# Patient Record
Sex: Female | Born: 1950 | Race: Black or African American | Hispanic: No | Marital: Single | Smoking: Current every day smoker
Health system: Southern US, Community
[De-identification: ages and names within clinical notes are randomized; demographics above are authoritative.]

## PROBLEM LIST (undated history)

## (undated) DIAGNOSIS — Z720 Tobacco use: Secondary | ICD-10-CM

## (undated) DIAGNOSIS — I10 Essential (primary) hypertension: Secondary | ICD-10-CM

## (undated) DIAGNOSIS — D631 Anemia in chronic kidney disease: Secondary | ICD-10-CM

## (undated) DIAGNOSIS — K219 Gastro-esophageal reflux disease without esophagitis: Secondary | ICD-10-CM

## (undated) DIAGNOSIS — R9389 Abnormal findings on diagnostic imaging of other specified body structures: Secondary | ICD-10-CM

## (undated) DIAGNOSIS — I2510 Atherosclerotic heart disease of native coronary artery without angina pectoris: Secondary | ICD-10-CM

## (undated) DIAGNOSIS — F419 Anxiety disorder, unspecified: Secondary | ICD-10-CM

## (undated) DIAGNOSIS — N189 Chronic kidney disease, unspecified: Secondary | ICD-10-CM

## (undated) DIAGNOSIS — M8580 Other specified disorders of bone density and structure, unspecified site: Secondary | ICD-10-CM

## (undated) DIAGNOSIS — N183 Chronic kidney disease, stage 3 unspecified: Secondary | ICD-10-CM

## (undated) DIAGNOSIS — F410 Panic disorder [episodic paroxysmal anxiety] without agoraphobia: Secondary | ICD-10-CM

## (undated) DIAGNOSIS — H35039 Hypertensive retinopathy, unspecified eye: Secondary | ICD-10-CM

## (undated) DIAGNOSIS — E559 Vitamin D deficiency, unspecified: Secondary | ICD-10-CM

## (undated) DIAGNOSIS — I509 Heart failure, unspecified: Secondary | ICD-10-CM

## (undated) DIAGNOSIS — J45909 Unspecified asthma, uncomplicated: Secondary | ICD-10-CM

## (undated) DIAGNOSIS — G5600 Carpal tunnel syndrome, unspecified upper limb: Secondary | ICD-10-CM

## (undated) DIAGNOSIS — J189 Pneumonia, unspecified organism: Secondary | ICD-10-CM

## (undated) DIAGNOSIS — R51 Headache: Secondary | ICD-10-CM

## (undated) DIAGNOSIS — K2100 Gastro-esophageal reflux disease with esophagitis, without bleeding: Secondary | ICD-10-CM

## (undated) DIAGNOSIS — M1990 Unspecified osteoarthritis, unspecified site: Secondary | ICD-10-CM

## (undated) DIAGNOSIS — K5090 Crohn's disease, unspecified, without complications: Secondary | ICD-10-CM

## (undated) DIAGNOSIS — R569 Unspecified convulsions: Secondary | ICD-10-CM

## (undated) DIAGNOSIS — E785 Hyperlipidemia, unspecified: Secondary | ICD-10-CM

## (undated) DIAGNOSIS — Z8679 Personal history of other diseases of the circulatory system: Secondary | ICD-10-CM

## (undated) HISTORY — DX: Atherosclerotic heart disease of native coronary artery without angina pectoris: I25.10

## (undated) HISTORY — DX: Hypertensive retinopathy, unspecified eye: H35.039

## (undated) HISTORY — PX: ECTOPIC PREGNANCY SURGERY: SHX613

## (undated) HISTORY — DX: Heart failure, unspecified: I50.9

## (undated) HISTORY — DX: Essential (primary) hypertension: I10

## (undated) HISTORY — DX: Carpal tunnel syndrome, unspecified upper limb: G56.00

## (undated) HISTORY — DX: Gastro-esophageal reflux disease with esophagitis, without bleeding: K21.00

## (undated) HISTORY — DX: Unspecified convulsions: R56.9

## (undated) HISTORY — PX: ILEOSTOMY: SHX1783

## (undated) HISTORY — DX: Other specified disorders of bone density and structure, unspecified site: M85.80

## (undated) HISTORY — DX: Vitamin D deficiency, unspecified: E55.9

## (undated) HISTORY — DX: Hyperlipidemia, unspecified: E78.5

## (undated) HISTORY — PX: EYE SURGERY: SHX253

## (undated) HISTORY — PX: CATARACT EXTRACTION: SUR2

## (undated) HISTORY — PX: CATARACT EXTRACTION W/ INTRAOCULAR LENS  IMPLANT, BILATERAL: SHX1307

## (undated) NOTE — *Deleted (*Deleted)
[facility] Note  05/28/2020     CHIEF COMPLAINT Patient presents for No chief complaint on file.   HISTORY OF PRESENT ILLNESS: [name] [name] is a 6 y.o. female who presents to the clinic today for:   pt states her left eye vision is clearing up, but is still slightly blurry, she denies fol  Referring physician: Shirleen Schirmer, PA-C     HISTORICAL INFORMATION:   Selected notes from the MEDICAL RECORD NUMBER Referred by Shirleen Schirmer for concern of PVD / vitreous hemorrhage   CURRENT MEDICATIONS: No current outpatient medications on file. (Ophthalmic Drugs)   No current facility-administered medications for this visit. (Ophthalmic Drugs)   Current Outpatient Medications (Other)  Medication Sig  . ascorbic acid (VITAMIN C) 500 MG tablet Take by mouth.  Marland Kitchen atorvastatin (LIPITOR) 40 MG tablet Take 1 tablet (40 mg total) by mouth daily. Stop taking simvastatin.  Marland Kitchen BIDIL 20-37.5 MG tablet Take 1 tablet by mouth 3 (three) times daily.  . calcium citrate (CALCITRATE - DOSED IN MG ELEMENTAL CALCIUM) 950 (200 Ca) MG tablet Take by mouth daily.  . carvedilol (COREG) 6.25 MG tablet Take 1 tablet (6.25 mg total) by mouth 2 (two) times daily with a meal.  . cholecalciferol (VITAMIN D) 1000 units tablet Take 1,000 Units by mouth daily.  . clopidogrel (PLAVIX) 75 MG tablet Take 1 tablet (75 mg total) by mouth daily.  . Ferrous Fumarate 86 (27 Fe) MG CAPS Take by mouth daily.  . hydrALAZINE (APRESOLINE) 25 MG tablet Take 1 tablet (25 mg total) by mouth 3 (three) times daily.  . isosorbide mononitrate (IMDUR) 30 MG 24 hr tablet Take 0.5 tablets (15 mg total) by mouth daily.  Marland Kitchen lidocaine (XYLOCAINE) 2 % solution Use as directed 20 mLs in the mouth or throat as needed for mouth pain.  Marland Kitchen LORazepam (ATIVAN) 0.5 MG tablet TAKE ONE TABLET BY MOUTH TWICE DAILY AS NEEDED FOR ANXIETY  . Mesalamine 800 MG TBEC TAKE ONE TABLET BY MOUTH THREE TIMES DAILY  . mirtazapine  (REMERON) 15 MG tablet Take 1 tablet (15 mg total) by mouth at bedtime.  . Multiple Vitamin (MULTIVITAMIN) tablet Take 1 tablet by mouth 2 (two) times daily.  . pregabalin (LYRICA) 25 MG capsule Take 1 capsule (25 mg total) by mouth 2 (two) times daily.  . sodium bicarbonate 650 MG tablet Take 1,300 mg by mouth 2 (two) times daily.  Marland Kitchen triamcinolone ointment (KENALOG) 0.1 % Apply 1 application topically 2 (two) times daily. Apply to skin around stoma   No current facility-administered medications for this visit. (Other)      REVIEW OF SYSTEMS:    ALLERGIES Allergies  Allergen Reactions  . Amoxicillin Anaphylaxis, Rash and Other (See Comments)    Throat Swelling  . Aspirin Anaphylaxis, Itching and Rash  . Morphine And Related Anaphylaxis  . Penicillins Anaphylaxis and Rash  . Gabapentin Other (See Comments)    Patient had one time seizure shortly after stopping gabapentin  . Ciprofloxacin Swelling    Possible reaction to cipro or clindamycin 04/29/14  . Ace Inhibitors Other (See Comments) and Cough    ACE possibly associated with cough and switched to ARB.  Would be OK to re-challenge if needed.    PAST MEDICAL HISTORY Past Medical History:  Diagnosis Date  . Abnormal chest CT    Coronary atherosclerosis on chest CT 2012  . Anemia, chronic renal failure   . Anxiety   . Asthma 05/2011  .  CAD (coronary artery disease)   . Carpal tunnel syndrome   . CHF (congestive heart failure) (HCC)    EF 30-35% 2012->EF 60-65% 2013  . CKD (chronic kidney disease), stage III ([place])   . Crohn's disease ([place])   . GERD (gastroesophageal reflux disease)   . Headache(784.0)    "related to high BP" (05/30/2012)  . 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 Hyperlipidemia   . Hypertension   . Hypertensive retinopathy    OU  . Osteopenia   . Panic attacks   . Reflux esophagitis   . Seizure ([place])    hx of  . Tobacco abuse   . Vitamin D deficiency    Past Surgical History:  Procedure  Laterality Date  . CATARACT EXTRACTION Bilateral    Dr. Clent Jacks  . CATARACT EXTRACTION W/ INTRAOCULAR LENS  IMPLANT, BILATERAL  ~ 2000  . CHOLECYSTECTOMY  01/28/2005  . COLOSTOMY  03/1996   diverting  . ECTOPIC PREGNANCY SURGERY  ?1980's   left  . EYE SURGERY Bilateral    Cat Sx - Dr. Clent Jacks  . ILEOSTOMY  ?  2002  . NECK SURGERY  2020   "pinched nerve"    FAMILY HISTORY Family History  Problem Relation Age of Onset  . Heart disease Mother   . Other Mother        Covid  . Glaucoma Mother   . Stroke Father   . Other Sister        AIDS    SOCIAL HISTORY Social History   Tobacco Use  . Smoking status: Current Every Day Smoker    Packs/day: 0.12    Years: 30.00    Pack years: 3.60    Types: Cigarettes  . Smokeless tobacco: Never Used  Substance Use Topics  . Alcohol use: No    Comment: 05/30/2012 "used to drink back in the day; last alcohol 23 yr ago"  . Drug use: No         OPHTHALMIC EXAM:  Not recorded     IMAGING AND PROCEDURES  Imaging and Procedures for 05/28/2020           ASSESSMENT/PLAN:    ICD-10-CM   1. Posterior vitreous detachment of left eye  H43.812   2. Retinal edema  H35.81   3. Proliferative retinopathy of right eye  H35.21   4. Retinal ischemia  H35.82   5. Essential hypertension  I10   6. Hypertensive retinopathy of both eyes  H35.033   7. Pseudophakia, both eyes  Z96.1     1,2. Hemorrhagic PVD OS  - pt [facility]  - Discussed findings and prognosis  - No RT or RD on 360 scleral depressed exam OS  - Reviewed s/s of RT/RD  - Strict return precautions for any such RT/RD signs/symptoms  - pt is on plavix  - VH improving  - VH precautions reviewed -- minimize activities, keep head elevated, avoid ASA/NSAIDs/blood thinners as able  - F/U 1 week, sooner prn -- DFE/OCT  3,4. Proliferative retinopathy, non diabetic OD; retinal ischemia OU  - FA 10.25.21 shows   OD - focal area of peripheral  nonperfusion with leaking NV, sup temp periphery   OS - focal area of peripheral nonperfusion with leaking MA, inf temp periphery  - recommend PRP OD today, 11.23.21  - pt wishes to proceed with laser  - RBA of procedure discussed, questions answered  - informed consent obtained and signed  - see  procedure note  - start PF QID OD x7 days  - f/u  5,6. Hypertensive retinopathy OU - discussed importance of tight BP control - monitor  7. Pseudophakia OU  - s/p CE/IOL OU (Dr. Elliot Dally)  - IOLs in good position, doing well  - monitor   Ophthalmic Meds Ordered this visit:  No orders of the defined types were placed in this encounter.      No follow-ups on file.  There are no Patient Instructions on file for this visit.  This document serves as a record of services personally performed by Gardiner Sleeper, MD, PhD. It was created on their behalf by San Jetty. Owens Shark, OA an ophthalmic technician. The creation of this record is the provider's dictation and/or activities during the visit.    Electronically signed by: San Jetty. Marguerita Merles 11.22.2021 12:40 PM  Gardiner Sleeper, [name], Ph.D. Diseases & Surgery of the Retina and Vitreous [facility]: M myopia (nearsighted); A astigmatism; H hyperopia (farsighted); P presbyopia; Mrx spectacle prescription;  CTL contact lenses; OD right eye; OS left eye; OU both eyes  XT exotropia; ET esotropia; PEK punctate epithelial keratitis; PEE punctate epithelial erosions; DES dry eye syndrome; MGD meibomian gland dysfunction; ATs artificial tears; PFAT's preservative free artificial tears; [employer] nuclear sclerotic cataract; PSC posterior subcapsular cataract; ERM epi-retinal membrane; PVD posterior vitreous detachment; RD retinal detachment; DM diabetes mellitus; DR diabetic retinopathy; NPDR non-proliferative diabetic retinopathy; PDR proliferative diabetic retinopathy; CSME clinically significant macular edema; DME  diabetic macular edema; dbh dot blot hemorrhages; CWS cotton wool spot; POAG primary open angle glaucoma; C/D cup-to-disc ratio; HVF humphrey visual field; GVF goldmann visual field; OCT optical coherence tomography; IOP intraocular pressure; BRVO Branch retinal vein occlusion; CRVO central retinal vein occlusion; CRAO central retinal artery occlusion; BRAO branch retinal artery occlusion; RT retinal tear; SB scleral buckle; PPV pars plana vitrectomy; VH Vitreous hemorrhage; PRP panretinal laser photocoagulation; IVK intravitreal kenalog; VMT vitreomacular traction; MH Macular hole;  NVD neovascularization of the disc; NVE neovascularization elsewhere; AREDS age related eye disease study; ARMD age related macular degeneration; POAG primary open angle glaucoma; EBMD epithelial/anterior basement membrane dystrophy; ACIOL anterior chamber intraocular lens; IOL intraocular lens; PCIOL posterior chamber intraocular lens; Phaco/IOL phacoemulsification with intraocular lens placement; [place] photorefractive keratectomy; LASIK laser assisted in situ keratomileusis; HTN hypertension; DM diabetes mellitus; COPD chronic obstructive pulmonary disease

## (undated) NOTE — *Deleted (*Deleted)
[facility] Note  05/21/2020     CHIEF COMPLAINT Patient presents for No chief complaint on file.   HISTORY OF PRESENT ILLNESS: [name] [name] is a 73 y.o. female who presents to the clinic today for:    Referring physician: Shirleen Schirmer, PA-C    HISTORICAL INFORMATION:   Selected notes from the MEDICAL RECORD NUMBER Referred by Shirleen Schirmer for concern of PVD / vitreous hemorrhage   CURRENT MEDICATIONS: No current outpatient medications on file. (Ophthalmic Drugs)   No current facility-administered medications for this visit. (Ophthalmic Drugs)   Current Outpatient Medications (Other)  Medication Sig  . ascorbic acid (VITAMIN C) 500 MG tablet Take by mouth.  Marland Kitchen atorvastatin (LIPITOR) 40 MG tablet Take 1 tablet (40 mg total) by mouth daily. Stop taking simvastatin.  Marland Kitchen BIDIL 20-37.5 MG tablet Take 1 tablet by mouth 3 (three) times daily.  . calcium citrate (CALCITRATE - DOSED IN MG ELEMENTAL CALCIUM) 950 (200 Ca) MG tablet Take by mouth daily.  . carvedilol (COREG) 6.25 MG tablet Take 1 tablet (6.25 mg total) by mouth 2 (two) times daily with a meal.  . cholecalciferol (VITAMIN D) 1000 units tablet Take 1,000 Units by mouth daily.  . clopidogrel (PLAVIX) 75 MG tablet Take 1 tablet (75 mg total) by mouth daily.  . Ferrous Fumarate 86 (27 Fe) MG CAPS Take by mouth daily.  . hydrALAZINE (APRESOLINE) 25 MG tablet Take 1 tablet (25 mg total) by mouth 3 (three) times daily.  . isosorbide mononitrate (IMDUR) 30 MG 24 hr tablet Take 0.5 tablets (15 mg total) by mouth daily.  Marland Kitchen lidocaine (XYLOCAINE) 2 % solution Use as directed 20 mLs in the mouth or throat as needed for mouth pain.  Marland Kitchen LORazepam (ATIVAN) 0.5 MG tablet TAKE ONE TABLET BY MOUTH TWICE DAILY AS NEEDED FOR ANXIETY  . Mesalamine 800 MG TBEC TAKE ONE TABLET BY MOUTH THREE TIMES DAILY  . mirtazapine (REMERON) 15 MG tablet Take 1 tablet (15 mg total) by mouth at bedtime.  . Multiple Vitamin  (MULTIVITAMIN) tablet Take 1 tablet by mouth 2 (two) times daily.  . pregabalin (LYRICA) 25 MG capsule Take 1 capsule (25 mg total) by mouth 2 (two) times daily.  . sodium bicarbonate 650 MG tablet Take 1,300 mg by mouth 2 (two) times daily.  Marland Kitchen triamcinolone ointment (KENALOG) 0.1 % Apply 1 application topically 2 (two) times daily. Apply to skin around stoma   No current facility-administered medications for this visit. (Other)      REVIEW OF SYSTEMS:    ALLERGIES Allergies  Allergen Reactions  . Amoxicillin Anaphylaxis, Rash and Other (See Comments)    Throat Swelling  . Aspirin Anaphylaxis, Itching and Rash  . Morphine And Related Anaphylaxis  . Penicillins Anaphylaxis and Rash  . Gabapentin Other (See Comments)    Patient had one time seizure shortly after stopping gabapentin  . Ciprofloxacin Swelling    Possible reaction to cipro or clindamycin 04/29/14  . Ace Inhibitors Other (See Comments) and Cough    ACE possibly associated with cough and switched to ARB.  Would be OK to re-challenge if needed.    PAST MEDICAL HISTORY Past Medical History:  Diagnosis Date  . Abnormal chest CT    Coronary atherosclerosis on chest CT 2012  . Anemia, chronic renal failure   . Anxiety   . Asthma 05/2011  . CAD (coronary artery disease)   . Carpal tunnel syndrome   . CHF (congestive heart  failure) ([place])    EF 30-35% 2012->EF 60-65% 2013  . CKD (chronic kidney disease), stage III ([place])   . Crohn's disease ([place])   . GERD (gastroesophageal reflux disease)   . Headache(784.0)    "related to high BP" (05/30/2012)  . 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 Hyperlipidemia   . Hypertension   . Hypertensive retinopathy    OU  . Osteopenia   . Panic attacks   . Reflux esophagitis   . Seizure ([place])    hx of  . Tobacco abuse   . Vitamin D deficiency    Past Surgical History:  Procedure Laterality Date  . CATARACT EXTRACTION Bilateral    Dr. Clent Jacks  . CATARACT EXTRACTION W/  INTRAOCULAR LENS  IMPLANT, BILATERAL  ~ 2000  . CHOLECYSTECTOMY  01/28/2005  . COLOSTOMY  03/1996   diverting  . ECTOPIC PREGNANCY SURGERY  ?1980's   left  . EYE SURGERY Bilateral    Cat Sx - Dr. Clent Jacks  . ILEOSTOMY  ?  2002  . NECK SURGERY  2020   "pinched nerve"    FAMILY HISTORY Family History  Problem Relation Age of Onset  . Heart disease Mother   . Other Mother        Covid  . Glaucoma Mother   . Stroke Father   . Other Sister        AIDS    SOCIAL HISTORY Social History   Tobacco Use  . Smoking status: Current Every Day Smoker    Packs/day: 0.12    Years: 30.00    Pack years: 3.60    Types: Cigarettes  . Smokeless tobacco: Never Used  Substance Use Topics  . Alcohol use: No    Comment: 05/30/2012 "used to drink back in the day; last alcohol 23 yr ago"  . Drug use: No         OPHTHALMIC EXAM:  Not recorded     IMAGING AND PROCEDURES  Imaging and Procedures for 05/21/2020           ASSESSMENT/PLAN:  No diagnosis found.  1,2. Hemorrhagic PVD OS  - pt presented [facility] 10[facility]8.2021  - Discussed findings and prognosis  - No RT or RD on 360 scleral depressed exam OS  - Reviewed s/s of RT/RD  - Strict return precautions for any such RT/RD signs/symptoms  - pt is on plavix  - VH improving  - VH precautions reviewed -- minimize activities, keep head elevated, avoid ASA/NSAIDs/blood thinners as able  - F/U 2-3 weeks, sooner prn -- DFE/OCT  3,4. Proliferative retinopathy, non diabetic OD; retinal ischemia OU  - FA 10.25.21 shows   OD - focal area of peripheral nonperfusion with leaking NV, sup temp periphery   OS - focal area of peripheral nonperfusion with leaking MA, inf temp periphery  - may benefit from focal, segmental PRP OD  5,6. Hypertensive retinopathy OU - discussed importance of tight BP control - monitor  7. Pseudophakia OU  - s/p CE/IOL OU (Dr. Elliot Dally)  - IOLs in good position, doing well  - monitor  Ophthalmic  Meds Ordered this visit:  No orders of the defined types were placed in this encounter.      No follow-ups on file.  There are no Patient Instructions on file for this visit.  This document serves as a record of services personally performed by Gardiner Sleeper, MD, PhD. It was created on their behalf by Estill Bakes, COT an  ophthalmic technician. The creation of this record is the provider's dictation and/or activities during the visit.    Electronically signed by: Estill Bakes, COT 11.11.21 @ 9:57 AM  Abbreviations: M myopia (nearsighted); A astigmatism; H hyperopia (farsighted); P presbyopia; Mrx spectacle prescription;  CTL contact lenses; OD right eye; OS left eye; OU both eyes  XT exotropia; ET esotropia; PEK punctate epithelial keratitis; PEE punctate epithelial erosions; DES dry eye syndrome; MGD meibomian gland dysfunction; ATs artificial tears; PFAT's preservative free artificial tears; [place] nuclear sclerotic cataract; PSC posterior subcapsular cataract; ERM epi-retinal membrane; PVD posterior vitreous detachment; RD retinal detachment; DM diabetes mellitus; DR diabetic retinopathy; NPDR non-proliferative diabetic retinopathy; PDR proliferative diabetic retinopathy; CSME clinically significant macular edema; DME diabetic macular edema; dbh dot blot hemorrhages; CWS cotton wool spot; POAG primary open angle glaucoma; C/D cup-to-disc ratio; HVF humphrey visual field; GVF goldmann visual field; OCT optical coherence tomography; IOP intraocular pressure; BRVO Branch retinal vein occlusion; CRVO central retinal vein occlusion; CRAO central retinal artery occlusion; BRAO branch retinal artery occlusion; RT retinal tear; SB scleral buckle; PPV pars plana vitrectomy; VH Vitreous hemorrhage; PRP panretinal laser photocoagulation; IVK intravitreal kenalog; VMT vitreomacular traction; MH Macular hole;  NVD neovascularization of the disc; NVE neovascularization elsewhere; AREDS age related eye disease  study; ARMD age related macular degeneration; POAG primary open angle glaucoma; EBMD epithelial/anterior basement membrane dystrophy; ACIOL anterior chamber intraocular lens; IOL intraocular lens; PCIOL posterior chamber intraocular lens; Phaco/IOL phacoemulsification with intraocular lens placement; [place] photorefractive keratectomy; LASIK laser assisted in situ keratomileusis; HTN hypertension; DM diabetes mellitus; COPD chronic obstructive pulmonary disease

---

## 1996-03-06 HISTORY — PX: COLOSTOMY: SHX63

## 1997-10-14 ENCOUNTER — Emergency Department (HOSPITAL_COMMUNITY): Admission: EM | Admit: 1997-10-14 | Discharge: 1997-10-14 | Payer: Self-pay | Admitting: Emergency Medicine

## 1997-11-02 ENCOUNTER — Encounter: Admission: RE | Admit: 1997-11-02 | Discharge: 1997-11-02 | Payer: Self-pay | Admitting: Family Medicine

## 1997-12-18 ENCOUNTER — Emergency Department (HOSPITAL_COMMUNITY): Admission: EM | Admit: 1997-12-18 | Discharge: 1997-12-18 | Payer: Self-pay | Admitting: Emergency Medicine

## 1997-12-31 ENCOUNTER — Encounter: Admission: RE | Admit: 1997-12-31 | Discharge: 1997-12-31 | Payer: Self-pay | Admitting: Family Medicine

## 1997-12-31 ENCOUNTER — Inpatient Hospital Stay (HOSPITAL_COMMUNITY): Admission: AD | Admit: 1997-12-31 | Discharge: 1998-01-03 | Payer: Self-pay

## 1998-01-14 ENCOUNTER — Encounter: Admission: RE | Admit: 1998-01-14 | Discharge: 1998-01-14 | Payer: Self-pay | Admitting: Family Medicine

## 1998-03-04 ENCOUNTER — Encounter: Admission: RE | Admit: 1998-03-04 | Discharge: 1998-03-04 | Payer: Self-pay | Admitting: Family Medicine

## 1998-04-10 ENCOUNTER — Inpatient Hospital Stay (HOSPITAL_COMMUNITY): Admission: EM | Admit: 1998-04-10 | Discharge: 1998-04-12 | Payer: Self-pay | Admitting: Emergency Medicine

## 1998-04-26 ENCOUNTER — Encounter: Admission: RE | Admit: 1998-04-26 | Discharge: 1998-04-26 | Payer: Self-pay | Admitting: Family Medicine

## 1998-05-08 ENCOUNTER — Inpatient Hospital Stay (HOSPITAL_COMMUNITY): Admission: EM | Admit: 1998-05-08 | Discharge: 1998-05-17 | Payer: Self-pay | Admitting: Emergency Medicine

## 1998-05-09 ENCOUNTER — Encounter: Admission: RE | Admit: 1998-05-09 | Discharge: 1998-05-09 | Payer: Self-pay | Admitting: Family Medicine

## 1998-05-14 ENCOUNTER — Encounter: Payer: Self-pay | Admitting: Emergency Medicine

## 1998-05-20 ENCOUNTER — Emergency Department (HOSPITAL_COMMUNITY): Admission: EM | Admit: 1998-05-20 | Discharge: 1998-05-20 | Payer: Self-pay | Admitting: Emergency Medicine

## 1998-05-20 ENCOUNTER — Encounter: Admission: RE | Admit: 1998-05-20 | Discharge: 1998-05-20 | Payer: Self-pay | Admitting: Family Medicine

## 1998-05-24 ENCOUNTER — Encounter (HOSPITAL_COMMUNITY): Admission: RE | Admit: 1998-05-24 | Discharge: 1998-08-22 | Payer: Self-pay | Admitting: Gastroenterology

## 1998-05-27 ENCOUNTER — Encounter: Admission: RE | Admit: 1998-05-27 | Discharge: 1998-05-27 | Payer: Self-pay | Admitting: Family Medicine

## 1998-06-21 ENCOUNTER — Encounter: Admission: RE | Admit: 1998-06-21 | Discharge: 1998-06-21 | Payer: Self-pay | Admitting: Family Medicine

## 1998-11-01 ENCOUNTER — Encounter (HOSPITAL_COMMUNITY): Admission: RE | Admit: 1998-11-01 | Discharge: 1999-01-30 | Payer: Self-pay | Admitting: Gastroenterology

## 1998-11-20 ENCOUNTER — Encounter: Admission: RE | Admit: 1998-11-20 | Discharge: 1998-11-20 | Payer: Self-pay | Admitting: Family Medicine

## 1998-12-16 ENCOUNTER — Encounter: Admission: RE | Admit: 1998-12-16 | Discharge: 1998-12-16 | Payer: Self-pay | Admitting: Family Medicine

## 1999-01-06 ENCOUNTER — Encounter: Admission: RE | Admit: 1999-01-06 | Discharge: 1999-01-06 | Payer: Self-pay | Admitting: Family Medicine

## 1999-01-27 ENCOUNTER — Encounter: Admission: RE | Admit: 1999-01-27 | Discharge: 1999-01-27 | Payer: Self-pay | Admitting: Sports Medicine

## 1999-03-03 ENCOUNTER — Encounter: Admission: RE | Admit: 1999-03-03 | Discharge: 1999-03-03 | Payer: Self-pay | Admitting: Family Medicine

## 1999-03-04 ENCOUNTER — Encounter: Admission: RE | Admit: 1999-03-04 | Discharge: 1999-03-04 | Payer: Self-pay | Admitting: Sports Medicine

## 1999-03-07 ENCOUNTER — Ambulatory Visit (HOSPITAL_COMMUNITY): Admission: RE | Admit: 1999-03-07 | Discharge: 1999-03-07 | Payer: Self-pay | Admitting: *Deleted

## 1999-03-07 ENCOUNTER — Encounter: Admission: RE | Admit: 1999-03-07 | Discharge: 1999-03-07 | Payer: Self-pay | Admitting: Family Medicine

## 1999-03-11 ENCOUNTER — Encounter: Admission: RE | Admit: 1999-03-11 | Discharge: 1999-03-11 | Payer: Self-pay | Admitting: Sports Medicine

## 1999-03-19 ENCOUNTER — Encounter: Admission: RE | Admit: 1999-03-19 | Discharge: 1999-03-19 | Payer: Self-pay | Admitting: Family Medicine

## 1999-04-08 ENCOUNTER — Encounter: Admission: RE | Admit: 1999-04-08 | Discharge: 1999-04-08 | Payer: Self-pay | Admitting: Sports Medicine

## 1999-04-29 ENCOUNTER — Encounter: Admission: RE | Admit: 1999-04-29 | Discharge: 1999-04-29 | Payer: Self-pay | Admitting: Sports Medicine

## 1999-08-04 ENCOUNTER — Emergency Department (HOSPITAL_COMMUNITY): Admission: EM | Admit: 1999-08-04 | Discharge: 1999-08-04 | Payer: Self-pay | Admitting: Emergency Medicine

## 1999-09-23 ENCOUNTER — Encounter: Admission: RE | Admit: 1999-09-23 | Discharge: 1999-09-23 | Payer: Self-pay | Admitting: Family Medicine

## 1999-09-29 ENCOUNTER — Encounter: Admission: RE | Admit: 1999-09-29 | Discharge: 1999-09-29 | Payer: Self-pay | Admitting: Family Medicine

## 1999-10-31 ENCOUNTER — Encounter: Admission: RE | Admit: 1999-10-31 | Discharge: 1999-10-31 | Payer: Self-pay | Admitting: Family Medicine

## 1999-11-21 ENCOUNTER — Ambulatory Visit (HOSPITAL_COMMUNITY): Admission: RE | Admit: 1999-11-21 | Discharge: 1999-11-21 | Payer: Self-pay | Admitting: Family Medicine

## 2000-04-12 ENCOUNTER — Encounter: Admission: RE | Admit: 2000-04-12 | Discharge: 2000-04-12 | Payer: Self-pay | Admitting: Family Medicine

## 2000-04-20 ENCOUNTER — Encounter: Payer: Self-pay | Admitting: Family Medicine

## 2000-04-20 ENCOUNTER — Encounter: Admission: RE | Admit: 2000-04-20 | Discharge: 2000-04-20 | Payer: Self-pay | Admitting: Family Medicine

## 2000-04-26 ENCOUNTER — Encounter: Admission: RE | Admit: 2000-04-26 | Discharge: 2000-04-26 | Payer: Self-pay | Admitting: Family Medicine

## 2000-04-26 ENCOUNTER — Encounter: Payer: Self-pay | Admitting: Family Medicine

## 2000-05-25 ENCOUNTER — Encounter: Admission: RE | Admit: 2000-05-25 | Discharge: 2000-05-25 | Payer: Self-pay | Admitting: Sports Medicine

## 2000-08-09 ENCOUNTER — Encounter: Admission: RE | Admit: 2000-08-09 | Discharge: 2000-08-09 | Payer: Self-pay | Admitting: Family Medicine

## 2000-08-09 ENCOUNTER — Encounter: Payer: Self-pay | Admitting: Family Medicine

## 2000-11-19 ENCOUNTER — Encounter: Admission: RE | Admit: 2000-11-19 | Discharge: 2000-11-19 | Payer: Self-pay | Admitting: Family Medicine

## 2000-11-30 ENCOUNTER — Encounter: Admission: RE | Admit: 2000-11-30 | Discharge: 2000-11-30 | Payer: Self-pay | Admitting: Sports Medicine

## 2001-06-07 ENCOUNTER — Encounter: Payer: Self-pay | Admitting: Emergency Medicine

## 2001-06-07 ENCOUNTER — Emergency Department (HOSPITAL_COMMUNITY): Admission: EM | Admit: 2001-06-07 | Discharge: 2001-06-07 | Payer: Self-pay | Admitting: *Deleted

## 2001-06-20 ENCOUNTER — Encounter: Admission: RE | Admit: 2001-06-20 | Discharge: 2001-06-20 | Payer: Self-pay | Admitting: Family Medicine

## 2001-06-24 ENCOUNTER — Encounter: Admission: RE | Admit: 2001-06-24 | Discharge: 2001-06-24 | Payer: Self-pay | Admitting: Family Medicine

## 2001-10-04 ENCOUNTER — Encounter (INDEPENDENT_AMBULATORY_CARE_PROVIDER_SITE_OTHER): Payer: Self-pay | Admitting: *Deleted

## 2001-10-04 LAB — CONVERTED CEMR LAB

## 2001-10-31 ENCOUNTER — Encounter: Admission: RE | Admit: 2001-10-31 | Discharge: 2001-10-31 | Payer: Self-pay | Admitting: Sports Medicine

## 2001-11-24 ENCOUNTER — Encounter: Admission: RE | Admit: 2001-11-24 | Discharge: 2001-11-24 | Payer: Self-pay | Admitting: Family Medicine

## 2001-11-24 ENCOUNTER — Encounter: Payer: Self-pay | Admitting: Family Medicine

## 2002-01-16 ENCOUNTER — Encounter: Admission: RE | Admit: 2002-01-16 | Discharge: 2002-01-16 | Payer: Self-pay | Admitting: Family Medicine

## 2002-04-28 ENCOUNTER — Emergency Department (HOSPITAL_COMMUNITY): Admission: EM | Admit: 2002-04-28 | Discharge: 2002-04-28 | Payer: Self-pay

## 2002-04-28 ENCOUNTER — Encounter: Payer: Self-pay | Admitting: Emergency Medicine

## 2002-07-21 ENCOUNTER — Encounter: Admission: RE | Admit: 2002-07-21 | Discharge: 2002-07-21 | Payer: Self-pay | Admitting: Family Medicine

## 2002-08-04 ENCOUNTER — Encounter: Admission: RE | Admit: 2002-08-04 | Discharge: 2002-08-04 | Payer: Self-pay | Admitting: Family Medicine

## 2002-08-28 ENCOUNTER — Encounter: Admission: RE | Admit: 2002-08-28 | Discharge: 2002-08-28 | Payer: Self-pay | Admitting: Family Medicine

## 2002-11-03 ENCOUNTER — Encounter: Admission: RE | Admit: 2002-11-03 | Discharge: 2002-11-03 | Payer: Self-pay | Admitting: Family Medicine

## 2002-12-21 ENCOUNTER — Ambulatory Visit (HOSPITAL_COMMUNITY): Admission: RE | Admit: 2002-12-21 | Discharge: 2002-12-21 | Payer: Self-pay | Admitting: Gastroenterology

## 2002-12-22 ENCOUNTER — Encounter: Admission: RE | Admit: 2002-12-22 | Discharge: 2002-12-22 | Payer: Self-pay | Admitting: Family Medicine

## 2003-01-29 ENCOUNTER — Emergency Department (HOSPITAL_COMMUNITY): Admission: EM | Admit: 2003-01-29 | Discharge: 2003-01-29 | Payer: Self-pay | Admitting: Emergency Medicine

## 2003-01-29 ENCOUNTER — Encounter: Payer: Self-pay | Admitting: Emergency Medicine

## 2003-02-01 ENCOUNTER — Encounter: Admission: RE | Admit: 2003-02-01 | Discharge: 2003-02-01 | Payer: Self-pay | Admitting: Family Medicine

## 2003-02-06 ENCOUNTER — Encounter: Admission: RE | Admit: 2003-02-06 | Discharge: 2003-02-06 | Payer: Self-pay | Admitting: Family Medicine

## 2003-02-22 ENCOUNTER — Encounter: Payer: Self-pay | Admitting: Family Medicine

## 2003-02-22 ENCOUNTER — Encounter: Admission: RE | Admit: 2003-02-22 | Discharge: 2003-02-22 | Payer: Self-pay | Admitting: Family Medicine

## 2003-05-03 ENCOUNTER — Encounter: Admission: RE | Admit: 2003-05-03 | Discharge: 2003-05-03 | Payer: Self-pay | Admitting: Family Medicine

## 2003-07-23 ENCOUNTER — Encounter: Admission: RE | Admit: 2003-07-23 | Discharge: 2003-07-23 | Payer: Self-pay | Admitting: Family Medicine

## 2003-09-24 ENCOUNTER — Encounter: Admission: RE | Admit: 2003-09-24 | Discharge: 2003-09-24 | Payer: Self-pay | Admitting: Family Medicine

## 2003-11-15 ENCOUNTER — Emergency Department (HOSPITAL_COMMUNITY): Admission: EM | Admit: 2003-11-15 | Discharge: 2003-11-15 | Payer: Self-pay | Admitting: Emergency Medicine

## 2004-01-04 ENCOUNTER — Encounter: Admission: RE | Admit: 2004-01-04 | Discharge: 2004-01-04 | Payer: Self-pay | Admitting: Family Medicine

## 2004-03-21 ENCOUNTER — Ambulatory Visit: Payer: Self-pay | Admitting: Family Medicine

## 2004-04-03 ENCOUNTER — Encounter: Admission: RE | Admit: 2004-04-03 | Discharge: 2004-04-03 | Payer: Self-pay | Admitting: Family Medicine

## 2004-05-20 ENCOUNTER — Ambulatory Visit: Payer: Self-pay | Admitting: Sports Medicine

## 2004-10-21 ENCOUNTER — Inpatient Hospital Stay (HOSPITAL_COMMUNITY): Admission: EM | Admit: 2004-10-21 | Discharge: 2004-10-23 | Payer: Self-pay | Admitting: Emergency Medicine

## 2004-10-21 ENCOUNTER — Encounter (INDEPENDENT_AMBULATORY_CARE_PROVIDER_SITE_OTHER): Payer: Self-pay | Admitting: Specialist

## 2005-01-28 HISTORY — PX: CHOLECYSTECTOMY: SHX55

## 2005-03-27 ENCOUNTER — Ambulatory Visit: Payer: Self-pay | Admitting: Family Medicine

## 2005-05-11 ENCOUNTER — Ambulatory Visit: Payer: Self-pay | Admitting: Family Medicine

## 2006-03-29 ENCOUNTER — Emergency Department (HOSPITAL_COMMUNITY): Admission: EM | Admit: 2006-03-29 | Discharge: 2006-03-29 | Payer: Self-pay | Admitting: Emergency Medicine

## 2006-04-05 LAB — HM COLONOSCOPY

## 2006-05-14 ENCOUNTER — Ambulatory Visit: Payer: Self-pay | Admitting: Family Medicine

## 2006-07-23 ENCOUNTER — Ambulatory Visit: Payer: Self-pay | Admitting: Family Medicine

## 2006-07-23 LAB — CONVERTED CEMR LAB
ALT: 28 units/L (ref 0–35)
CO2: 20 meq/L (ref 19–32)
Calcium: 9.4 mg/dL (ref 8.4–10.5)
Chloride: 108 meq/L (ref 96–112)
Creatinine, Ser: 1.16 mg/dL (ref 0.40–1.20)
Direct LDL: 129 mg/dL — ABNORMAL HIGH
Glucose, Bld: 99 mg/dL (ref 70–99)

## 2006-09-02 DIAGNOSIS — D649 Anemia, unspecified: Secondary | ICD-10-CM | POA: Insufficient documentation

## 2006-09-02 DIAGNOSIS — F411 Generalized anxiety disorder: Secondary | ICD-10-CM | POA: Insufficient documentation

## 2006-09-02 DIAGNOSIS — F419 Anxiety disorder, unspecified: Secondary | ICD-10-CM | POA: Insufficient documentation

## 2006-09-02 DIAGNOSIS — G609 Hereditary and idiopathic neuropathy, unspecified: Secondary | ICD-10-CM | POA: Insufficient documentation

## 2006-09-02 DIAGNOSIS — K210 Gastro-esophageal reflux disease with esophagitis: Secondary | ICD-10-CM

## 2006-09-02 DIAGNOSIS — K5090 Crohn's disease, unspecified, without complications: Secondary | ICD-10-CM

## 2006-09-02 DIAGNOSIS — Z87891 Personal history of nicotine dependence: Secondary | ICD-10-CM | POA: Insufficient documentation

## 2006-09-03 ENCOUNTER — Encounter (INDEPENDENT_AMBULATORY_CARE_PROVIDER_SITE_OTHER): Payer: Self-pay | Admitting: *Deleted

## 2006-11-16 ENCOUNTER — Emergency Department (HOSPITAL_COMMUNITY): Admission: EM | Admit: 2006-11-16 | Discharge: 2006-11-16 | Payer: Self-pay | Admitting: Emergency Medicine

## 2007-01-10 ENCOUNTER — Ambulatory Visit: Payer: Self-pay | Admitting: Family Medicine

## 2007-01-10 DIAGNOSIS — E7800 Pure hypercholesterolemia, unspecified: Secondary | ICD-10-CM | POA: Insufficient documentation

## 2007-01-10 LAB — CONVERTED CEMR LAB
ALT: 21 units/L (ref 0–35)
Alkaline Phosphatase: 141 units/L — ABNORMAL HIGH (ref 39–117)
CO2: 17 meq/L — ABNORMAL LOW (ref 19–32)
MCHC: 32.6 g/dL (ref 30.0–36.0)
Potassium: 4.3 meq/L (ref 3.5–5.3)
RBC: 3.66 M/uL — ABNORMAL LOW (ref 3.87–5.11)
RDW: 14.2 % — ABNORMAL HIGH (ref 11.5–14.0)
Sed Rate: 38 mm/hr — ABNORMAL HIGH (ref 0–22)
Sodium: 139 meq/L (ref 135–145)
Total Bilirubin: 0.4 mg/dL (ref 0.3–1.2)
Total Protein: 7.6 g/dL (ref 6.0–8.3)

## 2007-01-13 ENCOUNTER — Encounter: Admission: RE | Admit: 2007-01-13 | Discharge: 2007-01-13 | Payer: Self-pay | Admitting: Family Medicine

## 2007-02-04 ENCOUNTER — Encounter: Admission: RE | Admit: 2007-02-04 | Discharge: 2007-02-04 | Payer: Self-pay | Admitting: Family Medicine

## 2007-03-21 ENCOUNTER — Encounter: Payer: Self-pay | Admitting: Family Medicine

## 2007-05-31 ENCOUNTER — Emergency Department (HOSPITAL_COMMUNITY): Admission: EM | Admit: 2007-05-31 | Discharge: 2007-06-01 | Payer: Self-pay | Admitting: Emergency Medicine

## 2007-06-17 ENCOUNTER — Ambulatory Visit: Payer: Self-pay | Admitting: Family Medicine

## 2007-06-20 LAB — CONVERTED CEMR LAB
Cholesterol: 167 mg/dL (ref 0–200)
HCT: 36.2 % (ref 36.0–46.0)
HDL: 43 mg/dL (ref >39)
LDL Cholesterol: 93 mg/dL (ref 0–99)
MCV: 98.4 fL (ref 78.0–100.0)
Platelets: 223 10*3/uL (ref 150–400)
RBC: 3.68 M/uL — ABNORMAL LOW (ref 3.87–5.11)
RDW: 13.8 % (ref 11.5–15.5)
Total CHOL/HDL Ratio: 3.9

## 2007-07-04 ENCOUNTER — Encounter: Payer: Self-pay | Admitting: *Deleted

## 2007-07-07 ENCOUNTER — Encounter: Payer: Self-pay | Admitting: Family Medicine

## 2007-07-11 ENCOUNTER — Telehealth: Payer: Self-pay | Admitting: *Deleted

## 2007-07-12 ENCOUNTER — Ambulatory Visit: Payer: Self-pay | Admitting: Family Medicine

## 2007-10-06 ENCOUNTER — Encounter: Payer: Self-pay | Admitting: Family Medicine

## 2007-10-06 LAB — CONVERTED CEMR LAB
AST: 21 units/L
Alkaline Phosphatase: 113 units/L
Anion Gap: 11.4
CO2: 27 meq/L
Chloride: 106 meq/L
MCHC: 33.6 g/dL
Potassium: 4.4 meq/L
RBC: 3.69 M/uL
Sodium: 140 meq/L
WBC: 7.7 10*3/uL

## 2007-10-19 ENCOUNTER — Encounter: Payer: Self-pay | Admitting: Family Medicine

## 2007-11-18 ENCOUNTER — Ambulatory Visit: Payer: Self-pay | Admitting: Family Medicine

## 2007-11-18 DIAGNOSIS — R202 Paresthesia of skin: Secondary | ICD-10-CM

## 2007-11-18 DIAGNOSIS — R200 Anesthesia of skin: Secondary | ICD-10-CM | POA: Insufficient documentation

## 2007-11-18 LAB — CONVERTED CEMR LAB: TSH: 1.394 microintl units/mL (ref 0.350–5.50)

## 2008-03-06 ENCOUNTER — Encounter: Admission: RE | Admit: 2008-03-06 | Discharge: 2008-03-06 | Payer: Self-pay | Admitting: Family Medicine

## 2008-03-08 ENCOUNTER — Encounter: Admission: RE | Admit: 2008-03-08 | Discharge: 2008-03-08 | Payer: Self-pay | Admitting: Family Medicine

## 2008-04-27 ENCOUNTER — Telehealth: Payer: Self-pay | Admitting: Family Medicine

## 2008-05-09 ENCOUNTER — Ambulatory Visit: Payer: Self-pay | Admitting: Family Medicine

## 2008-05-09 ENCOUNTER — Encounter: Payer: Self-pay | Admitting: Family Medicine

## 2008-05-09 LAB — HM PAP SMEAR

## 2008-06-04 ENCOUNTER — Ambulatory Visit: Payer: Self-pay | Admitting: Family Medicine

## 2008-06-04 ENCOUNTER — Telehealth (INDEPENDENT_AMBULATORY_CARE_PROVIDER_SITE_OTHER): Payer: Self-pay | Admitting: *Deleted

## 2008-06-11 ENCOUNTER — Telehealth: Payer: Self-pay | Admitting: *Deleted

## 2008-09-27 ENCOUNTER — Encounter: Admission: RE | Admit: 2008-09-27 | Discharge: 2008-09-27 | Payer: Self-pay | Admitting: Family Medicine

## 2009-04-11 ENCOUNTER — Encounter: Admission: RE | Admit: 2009-04-11 | Discharge: 2009-04-11 | Payer: Self-pay | Admitting: Family Medicine

## 2009-05-07 ENCOUNTER — Ambulatory Visit: Payer: Self-pay | Admitting: Family Medicine

## 2009-07-09 ENCOUNTER — Ambulatory Visit: Payer: Self-pay | Admitting: Family Medicine

## 2009-10-09 ENCOUNTER — Ambulatory Visit: Payer: Self-pay | Admitting: Family Medicine

## 2009-10-14 LAB — CONVERTED CEMR LAB
ALT: 34 units/L (ref 0–35)
AST: 28 units/L (ref 0–37)
Albumin: 4.6 g/dL (ref 3.5–5.2)
BUN: 12 mg/dL (ref 6–23)
Calcium: 9.5 mg/dL (ref 8.4–10.5)
Chloride: 110 meq/L (ref 96–112)
HDL: 45 mg/dL (ref >39)
Hemoglobin: 12.3 g/dL (ref 12.0–15.0)
Potassium: 4.1 meq/L (ref 3.5–5.3)
RDW: 14 % (ref 11.5–15.5)
TSH: 1.266 microintl units/mL (ref 0.350–4.500)
Total Protein: 7.7 g/dL (ref 6.0–8.3)
Vitamin B-12: 402 pg/mL (ref 211–911)

## 2009-10-17 ENCOUNTER — Telehealth: Payer: Self-pay | Admitting: Family Medicine

## 2010-03-05 ENCOUNTER — Ambulatory Visit: Payer: Self-pay | Admitting: Family Medicine

## 2010-04-04 ENCOUNTER — Encounter: Payer: Self-pay | Admitting: Family Medicine

## 2010-04-18 ENCOUNTER — Encounter: Admission: RE | Admit: 2010-04-18 | Discharge: 2010-04-18 | Payer: Self-pay | Admitting: Family Medicine

## 2010-04-18 LAB — HM MAMMOGRAPHY

## 2010-05-09 ENCOUNTER — Ambulatory Visit: Payer: Self-pay | Admitting: Family Medicine

## 2010-07-27 ENCOUNTER — Encounter: Payer: Self-pay | Admitting: Family Medicine

## 2010-07-28 ENCOUNTER — Encounter: Payer: Self-pay | Admitting: Family Medicine

## 2010-08-05 NOTE — Consult Note (Signed)
Summary: [facility]   Imported By: Raymond Gurney 04/22/2010 15:35:55  _____________________________________________________________________  External Attachment:    Type:   Image     Comment:   External Document

## 2010-08-05 NOTE — Assessment & Plan Note (Signed)
Summary: flu shot,tcb  Nurse Visit   Vital Signs:  Patient profile:   60 year old female Temp:     35 degrees F  Vitals Entered By: Marcell Barlow RN (May 07, 2009 10:58 AM)  Allergies: 1)  Amoxicillin (Amoxicillin) 2)  Aspirin (Aspirin)  Immunizations Administered:  Influenza Vaccine # 1:    Vaccine Type: Fluvax MCR    Site: left deltoid    Mfr: GlaxoSmithKline    Dose: 0.5 ml    Route: IM    Given by: Marcell Barlow RN    Exp. Date: 01/02/2010    Lot #: [id number]    VIS given: 02/12/2009  Flu Vaccine Consent Questions:    Do you have a history of severe allergic reactions to this vaccine? no    Any prior history of allergic reactions to egg and/or gelatin? no    Do you have a sensitivity to the preservative Thimersol? no    Do you have a past history of Guillan-Barre Syndrome? no    Do you currently have an acute febrile illness? no    Have you ever had a severe reaction to latex? no    Vaccine information given and explained to patient? yes    Are you currently pregnant? no  Orders Added: 1)  Influenza Vaccine MCR [00025] 2)  Administration Flu vaccine - MCR [[id number]]

## 2010-08-05 NOTE — Assessment & Plan Note (Signed)
Summary: flu shot/kh  Nurse Visit   Vital Signs:  Patient profile:   60 year old female Temp:     98.6 degrees F  Vitals Entered By: Marcell Barlow RN (May 09, 2010 5:10 PM)  Allergies: 1)  Amoxicillin (Amoxicillin) 2)  Aspirin (Aspirin)  Immunizations Administered:  Influenza Vaccine # 1:    Vaccine Type: Fluvax MCR    Site: left deltoid    Mfr: GlaxoSmithKline    Dose: 0.5 ml    Route: IM    Given by: Marcell Barlow RN    Exp. Date: 12/31/2010    Lot #: [id number]    VIS given: 01/28/10 version given May 09, 2010.  Flu Vaccine Consent Questions:    Do you have a history of severe allergic reactions to this vaccine? no    Any prior history of allergic reactions to egg and/or gelatin? no    Do you have a sensitivity to the preservative Thimersol? no    Do you have a past history of Guillan-Barre Syndrome? no    Do you currently have an acute febrile illness? no    Have you ever had a severe reaction to latex? no    Vaccine information given and explained to patient? yes    Are you currently pregnant? no  Orders Added: 1)  Influenza Vaccine MCR [00025] 2)  Administration Flu vaccine - MCR [U9811]

## 2010-08-05 NOTE — Assessment & Plan Note (Signed)
Summary: f/u [name] [name]   Vital Signs:  Patient Profile:   60 Years Old Female Height:     66 inches Weight:      145 pounds Temp:     98.5 degrees F Pulse rate:   93 / minute BP sitting:   148 / 89  (left arm)  Pt. in pain?   no  Vitals Entered By: Arnette Schaumann RN (May 09, 2008 4:19 PM)                  History of Present Illness: Using chantix to quit smoking.  Recently had nausea and has stopped chantix for 5 days.  Only smoking 7 cigarettes per day.    Also has noticed BP being higher on Chantix.  Has not had HBP in the past  Prevention, needs PAP.  Hates them.  Agrees to one today.  Last Flex Sig:  Done. (12/05/2002 12:00:00 AM) Flex Sig Next Due:  Not Indicated    Current Allergies: AMOXICILLIN (AMOXICILLIN) ASPIRIN (ASPIRIN)    Risk Factors:     Counseled to quit/cut down tobacco use:  yes  Colonoscopy History:     Date of Last Colonoscopy:  04/05/2006   PAP Smear History:     Date of Last PAP Smear:  10/04/2001     Physical Exam  General:     Well-developed,well-nourished,in no acute distress; alert,appropriate and cooperative throughout examination Genitalia:     Abnormal vagina and introitus with multiple fistuli from here Crohns.  Cervix irregular (Previous cervical laceration?) Pap taken    Impression & Recommendations:  Problem # 1:  Preventive Health Care (ICD-V70.0) Pap done  Problem # 2:  TOBACCO DEPENDENCE (ICD-305.1) Discussed holding chantix  Also watch bP and salt intake. Her updated medication list for this problem includes:    Chantix Starting Month Pak 0.5 Mg X 11 & 1 Mg X 42 Misc (Varenicline tartrate) .Marland Kitchen... Take as directed  Orders: [place]- Est Level  3 (19379)   Complete Medication List: 1)  Asacol 400 Mg Tbec (Mesalamine) .... Take 3 tablet by mouth two times a day 2)  Simvastatin 20 Mg Tabs (Simvastatin) .... Take 1 tablet by mouth every night 3)  Sm Calcium/vitamin D 500-200 Mg-unit Tabs (Calcium carbonate-vitamin d)  .... Take 1 tablet by mouth three times a day 4)  Chantix Starting Month Pak 0.5 Mg X 11 & 1 Mg X 42 Misc (Varenicline tartrate) .... Take as directed  Other Orders: Pap Smear-[facility] ([id number]) Influenza Vaccine NON MCR (99242)   Patient Instructions: 1)  Please schedule a follow-up appointment in 2 months.  Keep an eye on BP between now and then   ]  Influenza Vaccine    Vaccine Type: Fluvax Non-MCR    Site: left deltoid    Mfr: GlaxoSmithKline    Dose: 0.5 ml    Route: IM    Given by: AMY MARTIN RN    Exp. Date: 01/02/2009    Lot #: [id number]    VIS given: 01/27/07 version given May 09, 2008.  Flu Vaccine Consent Questions    Do you have a history of severe allergic reactions to this vaccine? no    Any prior history of allergic reactions to egg and/or gelatin? no    Do you have a sensitivity to the preservative Thimersol? no    Do you have a past history of Guillan-Barre Syndrome? no    Do you currently have an acute febrile illness? no    Have  you ever had a severe reaction to latex? no    Vaccine information given and explained to patient? yes    Are you currently pregnant? no

## 2010-08-05 NOTE — Miscellaneous (Signed)
  Clinical Lists Changes  Observations: Added new observation of PAP DUE: 05/10/2011 (10/09/2009 10:15) Added new observation of DM PROGRESS: N/A (10/09/2009 10:15) Added new observation of DM FSREVIEW: N/A (10/09/2009 10:15) Added new observation of HTN PROGRESS: N/A (10/09/2009 10:15) Added new observation of HTN FSREVIEW: N/A (10/09/2009 10:15)      Prevention & Chronic Care Immunizations   Influenza vaccine: Fluvax MCR  (05/07/2009)   Influenza vaccine due: 05/09/2009    Tetanus booster: 03/07/1999: Done.   Tetanus booster due: 03/06/2009    Pneumococcal vaccine: Done.  (04/06/1999)   Pneumococcal vaccine due: None  Colorectal Screening   Hemoccult: Not documented   Hemoccult due: Not Indicated    Colonoscopy: Done.  (04/05/2006)   Colonoscopy due: 04/05/2016  Other Screening   Pap smear: NEGATIVE FOR INTRAEPITHELIAL LESIONS OR MALIGNANCY.  (05/09/2008)   Pap smear due: 05/10/2011    Mammogram: BI-RADS CATEGORY 3:  Probably benign finding(s) - short interval^MM DIGITAL DIAGNOSTIC BILAT  (04/11/2009)   Mammogram due: 04/2010   Smoking status: current  (07/09/2009)   Smoking cessation counseling: yes  (06/04/2008)  Lipids   Total Cholesterol: 167  (06/17/2007)   LDL: 93  (06/17/2007)   LDL Direct: 129  (07/23/2006)   HDL: 43  (06/17/2007)   Triglycerides: 155  (06/17/2007)    SGOT (AST): 21  (10/06/2007)   SGPT (ALT): 16  (10/06/2007)   Alkaline phosphatase: 113  (10/06/2007)   Total bilirubin: 0.6  (10/06/2007)  Self-Management Support :    Lipid self-management support: Not documented

## 2010-08-05 NOTE — Assessment & Plan Note (Signed)
Summary: CPE/KH   Vital Signs:  Patient profile:   60 year old female Height:      66 inches Weight:      147.8 pounds BMI:     23.94 Temp:     98.7 degrees F oral Pulse rate:   105 / minute Pulse rhythm:   regular BP sitting:   123 / 71  (left arm) Cuff size:   regular  Vitals Entered By: Audelia Hives CMA (March 05, 2010 2:42 PM) CC: Fu neuropathy   Primary Care Kesa Birky:  Madison Hickman MD  CC:  Fu neuropathy.  History of Present Illness: Returns with continued leg/foot discomfort.  It has been help by gabapentin without side effects. No other complaints.  Reviewed labs, all normal.  I was particularly concerned about B12 level due to Crohns.  Habits & Providers  Alcohol-Tobacco-Diet     Tobacco Status: current     Tobacco Counseling: to quit use of tobacco products     Cigarette Packs/Day: <0.25  Current Medications (verified): 1)  Asacol 400 Mg Tbec (Mesalamine) .... Take 3 Tablet By Mouth Two Times A Day 2)  Simvastatin 20 Mg Tabs (Simvastatin) .... Take 1 Tablet By Mouth Every Night For Cholesterol Lowering 3)  Sm Calcium/vitamin D 500-200 Mg-Unit Tabs (Calcium Carbonate-Vitamin D) .... Take 1 Tablet By Mouth Three Times A Day 4)  Capsaicin 0.075 % Crea (Capsaicin) .... Apply To Feet Four Times A Day For Pain/numbness. Disp 60 G 5)  Hydrochlorothiazide 12.5 Mg  Tabs (Hydrochlorothiazide) .... Take 1 Tab  By Mouth Every Morning - Blood Pressure Medicine (Also Fluid Pill) 6)  Gabapentin 100 Mg Caps (Gabapentin) .... One By Mouth Three Times A Day - To Help Neuropathy - Tingling in Your Feet  Allergies (verified): 1)  Amoxicillin (Amoxicillin) 2)  Aspirin (Aspirin)  Past History:  Past medical, surgical, family and social histories (including risk factors) reviewed, and no changes noted (except as noted below).  Past Medical History: Reviewed history from 09/02/2006 and no changes required. also on purinethol 1&1/2 tabs qd by GI, echo 1994 EF=30-35%, nl nerve  conduction studies 2/04 doubt neuropathy, repeat echo 2001, EF=normal!!!  Past Surgical History: Reviewed history from 09/02/2006 and no changes required. 05/14/06 ldl: 177 hdl: 44 tri: 250 - 05/21/2006, BMD scan: t-score -0.71 (l calcaneus) - 05/25/2000, Cholecystectomy - 01/28/2005, diverting colostomy 09/97 -, Lt salpingectomy -  Family History: Reviewed history and no changes required.  Social History: Reviewed history from 06/04/2008 and no changes required. Tobacco abuse about 1 ppweek; ETOH insignificant.  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  Physical Exam  General:  Well-developed,well-nourished,in no acute distress; alert,appropriate and cooperative throughout examination Extremities:  Great foot pulses.  Sensory complaints are all distal to ankle.   Impression & Recommendations:  Problem # 1:  NEUROPATHY, PERIPHERAL (ICD-356.9)  Discussed perhaps increasing neurontin dose at night.  She wants to keep it same for now.  No other WU at this time.  Orders: [place]- Est Level  3 (25638)  Complete Medication List: 1)  Asacol 400 Mg Tbec (Mesalamine) .... Take 3 tablet by mouth two times a day 2)  Simvastatin 20 Mg Tabs (Simvastatin) .... Take 1 tablet by mouth every night for cholesterol lowering 3)  Sm Calcium/vitamin D 500-200 Mg-unit Tabs (Calcium carbonate-vitamin d) .... Take 1 tablet by mouth three times a day 4)  Capsaicin 0.075 % Crea (Capsaicin) .... Apply to feet four times a day for pain/numbness. disp 60 g 5)  Hydrochlorothiazide 12.5 Mg  Tabs (Hydrochlorothiazide) .... Take 1 tab  by mouth every morning - blood pressure medicine (also fluid pill) 6)  Gabapentin 100 Mg Caps (Gabapentin) .... One by mouth three times a day - to help neuropathy - tingling in your feet   Prevention & Chronic Care Immunizations   Influenza vaccine: Fluvax MCR  (05/07/2009)   Influenza vaccine due: 05/09/2009    Tetanus booster: 10/09/2009: Tdap   Tetanus booster due: 03/06/2009    Pneumococcal  vaccine: Done.  (04/06/1999)   Pneumococcal vaccine due: None  Colorectal Screening   Hemoccult: Not documented   Hemoccult due: Not Indicated    Colonoscopy: Done.  (04/05/2006)   Colonoscopy due: 04/05/2016  Other Screening   Pap smear: NEGATIVE FOR INTRAEPITHELIAL LESIONS OR MALIGNANCY.  (05/09/2008)   Pap smear due: 05/10/2011    Mammogram: BI-RADS CATEGORY 3:  Probably benign finding(s) - short interval^MM DIGITAL DIAGNOSTIC BILAT  (04/11/2009)   Mammogram due: 04/2010   Smoking status: current  (03/05/2010)   Smoking cessation counseling: yes  (06/04/2008)  Lipids   Total Cholesterol: 144  (10/09/2009)   Lipid panel action/deferral: Lipid Panel ordered   LDL: 69  (10/09/2009)   LDL Direct: 129  (07/23/2006)   HDL: 45  (10/09/2009)   Triglycerides: 151  (10/09/2009)    SGOT (AST): 28  (10/09/2009)   BMP action: Ordered   SGPT (ALT): 34  (10/09/2009)   Alkaline phosphatase: 125  (10/09/2009)   Total bilirubin: 0.4  (10/09/2009)    Lipid flowsheet reviewed?: Yes   Progress toward LDL goal: At goal  Self-Management Support :   Personal Goals (by the next clinic visit) :      Personal LDL goal: 130  (10/09/2009)    Lipid self-management support: Written self-care plan  (03/05/2010)   Lipid self-care plan printed.

## 2010-08-05 NOTE — Progress Notes (Signed)
Summary: meds  Phone Note Call from Patient Call back at Home Phone [phone]   Caller: Patient Summary of Call: pt called to say she would like to start on neuro meds. [facility] [place]  Follow-up for Phone Call        We discussed meds for neuropathy last visit.  Will start on gabapentin. Follow-up by: Madison Hickman MD,  October 17, 2009 11:54 AM    New/Updated Medications: GABAPENTIN 100 MG CAPS (GABAPENTIN) one by mouth at bedtime x 1 week then one by mouth two times a day x 1 week then one by mouth three times a day thereafter Prescriptions: GABAPENTIN 100 MG CAPS (GABAPENTIN) one by mouth at bedtime x 1 week then one by mouth two times a day x 1 week then one by mouth three times a day thereafter  #90 x 12   Entered and Authorized by:   Madison Hickman MD   Signed by:   Madison Hickman MD on 10/17/2009   Method used:   Electronically to        [facility] (retail)       [place], [place]  [id number]       Ph: [phone]       Fax: [phone]   RxID:   [id number]   Appended Document: meds pt called and told about rx being sent

## 2010-08-05 NOTE — Assessment & Plan Note (Signed)
Summary: numbness in feet,df   Vital Signs:  Patient profile:   60 year old female Height:      66 inches Weight:      150 pounds BMI:     24.30 Temp:     98 degrees F oral Pulse rate:   95 / minute BP sitting:   119 / 79  Vitals Entered By: Schuyler Amor CMA (July 09, 2009 10:58 AM) CC: tingling sensation in both feet Is Patient Diabetic? No Pain Assessment Patient in pain? no        Primary Care Provider:  Madison Hickman MD  CC:  tingling sensation in both feet.  History of Present Illness: 60 y/o female with h/o peripheral neuropathy in the past per EMR presents with recurrent tingling in feet  sensation starts when patient lying down, either on her left or right side in bed. No problem if on stomach or if moves frequently. sometimes needs to get up out of bed and let feet dangle on side.  Location of numbness is actually in feet. has had similar presentation in the past (01/2007) with negative work up. no further mention in records. prior dx of peripheral neuropathy starting in 2004 by Dr. Jannifer Franklin of neuro with normal nerve conduction studies. no known h/o diabetes. no polyuria, polydipsia. no wounds on feet. no urge to get up at night and walk.   Habits & Providers  Alcohol-Tobacco-Diet     Tobacco Status: current     Tobacco Counseling: to remain off tobacco products     Cigarette Packs/Day: 0.5  Current Medications (verified): 1)  Asacol 400 Mg Tbec (Mesalamine) .... Take 3 Tablet By Mouth Two Times A Day 2)  Simvastatin 20 Mg Tabs (Simvastatin) .... Take 1 Tablet By Mouth Every Night 3)  Sm Calcium/vitamin D 500-200 Mg-Unit Tabs (Calcium Carbonate-Vitamin D) .... Take 1 Tablet By Mouth Three Times A Day 4)  Chantix Starting Month Pak 0.5 Mg X 11 & 1 Mg X 42  Misc (Varenicline Tartrate) .... Take As Directed 5)  Capsaicin 0.075 % Crea (Capsaicin) .... Apply To Feet Four Times A Day For Pain/numbness. Disp 60 G  Allergies (verified): 1)  Amoxicillin  (Amoxicillin) 2)  Aspirin (Aspirin)  Past History:  Past medical history reviewed for relevance to current acute and chronic problems.  Past Medical History: Reviewed history from 09/02/2006 and no changes required. also on purinethol 1&1/2 tabs qd by GI, echo 1994 EF=30-35%, nl nerve conduction studies 2/04 doubt neuropathy, repeat echo 2001, EF=normal!!!  Social History: Packs/Day:  0.5  Physical Exam  General:  NAD, alert, pleasant, smells of tobacco smoke. vitals reviewed Extremities:   Foot Exam (with socks and/or shoes not present):       Sensory-Pinprick/Light touch:          Left medial foot (L-4): diminished          Left dorsal foot (L-5): diminished          Left lateral foot (S-1): diminished          Right medial foot (L-4): diminished          Right dorsal foot (L-5): diminished          Right lateral foot (S-1): diminished       Sensory-Monofilament:          Left foot: diminished          Right foot: diminished       Inspection:  Left foot: normal          Right foot: normal       Nails:          Left foot: too long          Right foot: thickened Neurologic:  alert & oriented X3, cranial nerves II-XII intact, gait normal, and DTRs symmetrical and normal.     Impression & Recommendations:  Problem # 1:  NEUROPATHY, PERIPHERAL (ICD-356.9) Assessment Deteriorated  etiology unclear. discussed with Dr. McDiarmid. HPI not fully consistent with restless syndrome. sounds more like compressive neuropathy; however, given c/o foot numbness, unlikely all compressive since no corresponding nerve distribution could explain symptoms. recommend try and alleviate compression at night and try capsaicin QID. f/u with PCP in few weeks to discuss further and determine further w/u if symptoms persist.   Orders: [place]- Est  Level 4 ([address]214)  Complete Medication List: 1)  Asacol 400 Mg Tbec (Mesalamine) .... Take 3 tablet by mouth two times a day 2)  Simvastatin 20 Mg  Tabs (Simvastatin) .... Take 1 tablet by mouth every night 3)  Sm Calcium/vitamin D 500-200 Mg-unit Tabs (Calcium carbonate-vitamin d) .... Take 1 tablet by mouth three times a day 4)  Chantix Starting Month Pak 0.5 Mg X 11 & 1 Mg X 42 Misc (Varenicline tartrate) .... Take as directed 5)  Capsaicin 0.075 % Crea (Capsaicin) .... Apply to feet four times a day for pain/numbness. disp 60 g  Patient Instructions: 1)  Nice to have met you! 2)  Your foot numbness could be from a few things. Try to put a pillow on your side while you sleep to prevent your thigh from pressing against the mattress. You can try CAPSAICIN cream four times a day 3)  Please schedule appointment with Dr. Andria Frames in 3-4 weeks to discuss foot numbness further Prescriptions: CAPSAICIN 0.075 % CREA (CAPSAICIN) apply to feet four times a day for pain/numbness. disp 60 g  #1 x 0   Entered and Authorized by:   Nancy Nordmann  MD   Signed by:   Nancy Nordmann  MD on 07/09/2009   Method used:   Electronically to        [facility] (retail)       [address]       [place], [place]  [id number]       Ph: [phone]       Fax: [phone]   RxID:   [id number]

## 2010-08-05 NOTE — Assessment & Plan Note (Signed)
Summary: numbness in feet,df   Vital Signs:  Patient profile:   60 year old female Height:      66 inches Weight:      151.5 pounds BMI:     24.54 Temp:     98.2 degrees F oral Pulse rate:   93 / minute BP sitting:   161 / 91  (left arm) Cuff size:   regular  Vitals Entered By: Isabelle Course (October 09, 2009 3:11 PM)  CC: C/O numbness in feet Is Patient Diabetic? No Pain Assessment Patient in pain? no        Primary Care Provider:  Madison Hickman MD  CC:  C/O numbness in feet.  History of Present Illness: numbness in legs  Both feet.  Symetric.  Not too much dysasthesia.    BP up.  Confirmed on repeat.  Otherwise feeling well  Habits & Providers  Alcohol-Tobacco-Diet     Tobacco Status: current     Tobacco Counseling: to quit use of tobacco products     Cigarette Packs/Day: <0.25  Current Medications (verified): 1)  Asacol 400 Mg Tbec (Mesalamine) .... Take 3 Tablet By Mouth Two Times A Day 2)  Simvastatin 20 Mg Tabs (Simvastatin) .... Take 1 Tablet By Mouth Every Night 3)  Sm Calcium/vitamin D 500-200 Mg-Unit Tabs (Calcium Carbonate-Vitamin D) .... Take 1 Tablet By Mouth Three Times A Day 4)  Capsaicin 0.075 % Crea (Capsaicin) .... Apply To Feet Four Times A Day For Pain/numbness. Disp 60 G 5)  Hydrochlorothiazide 12.5 Mg  Tabs (Hydrochlorothiazide) .... Take 1 Tab  By Mouth Every Morning  Allergies (verified): 1)  Amoxicillin (Amoxicillin) 2)  Aspirin (Aspirin)  Past History:  Past medical, surgical, family and social histories (including risk factors) reviewed, and no changes noted (except as noted below).  Past Medical History: Reviewed history from 09/02/2006 and no changes required. also on purinethol 1&1/2 tabs qd by GI, echo 1994 EF=30-35%, nl nerve conduction studies 2/04 doubt neuropathy, repeat echo 2001, EF=normal!!!  Past Surgical History: Reviewed history from 09/02/2006 and no changes required. 05/14/06 ldl: 177 hdl: 44 tri: 250 -  05/21/2006, BMD scan: t-score -0.71 (l calcaneus) - 05/25/2000, Cholecystectomy - 01/28/2005, diverting colostomy 09/97 -, Lt salpingectomy -  Family History: Reviewed history and no changes required.  Social History: Reviewed history from 06/04/2008 and no changes required. Tobacco abuse about 1 ppweek; ETOH insignificant.  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    Physical Exam  General:  Well-developed,well-nourished,in no acute distress; alert,appropriate and cooperative throughout examination Lungs:  Normal respiratory effort, chest expands symmetrically. Lungs are clear to auscultation, no crackles or wheezes. Heart:  Normal rate and regular rhythm. S1 and S2 normal without gallop, murmur, click, rub or other extra sounds. Pulses:  R and L carotid,radial,femoral,dorsalis pedis and posterior tibial pulses are full and equal bilaterally Extremities:  No clubbing, cyanosis, edema, or deformity noted with normal full range of motion of all joints.   Neurologic:  Diminished fine touch and position sense of both lower extremities.     Impression & Recommendations:  Problem # 1:  NEUROPATHY, PERIPHERAL (ICD-356.9) Investigate.  She is at particular risk for B12 deficiency due to Crohns.   Orders: B12-[facility] ([address]102-58527) TSH-[facility] ([address]242-35361) [place]- Est  Level 4 ([address]315)  Problem # 2:  ANEMIA, OTHER, UNSPECIFIED (ICD-285.9) Check status Orders: CBC-[facility] (40086) [place]-  Est  Level 4 (99214)  Problem # 3:  CROHN'S DISEASE (ICD-555.9) stable.  Malabsorbtion could play a role in her neuropathy. Orders: B12-[facility] ([id number]) [place]- Est  Level 4 (21308)  Complete Medication List: 1)  Asacol 400 Mg Tbec (Mesalamine) .... Take 3 tablet by mouth two times a day 2)  Simvastatin 20 Mg Tabs (Simvastatin) .... Take 1 tablet by mouth every  night 3)  Sm Calcium/vitamin D 500-200 Mg-unit Tabs (Calcium carbonate-vitamin d) .... Take 1 tablet by mouth three times a day 4)  Capsaicin 0.075 % Crea (Capsaicin) .... Apply to feet four times a day for pain/numbness. disp 60 g 5)  Hydrochlorothiazide 12.5 Mg Tabs (Hydrochlorothiazide) .... Take 1 tab  by mouth every morning  Other Orders: T-Lipid Profile ([id number]) T-Comprehensive Metabolic Panel (52841-32440) Tdap => 48yr IM ((10272 Admin 1st Vaccine ((53664  Patient Instructions: 1)  I will call with lab results.   Prescriptions: HYDROCHLOROTHIAZIDE 12.5 MG  TABS (HYDROCHLOROTHIAZIDE) Take 1 tab  by mouth every morning  #90 x 3   Entered and Authorized by:   WMadison HickmanMD   Signed by:   WMadison HickmanMD on 10/09/2009   Method used:   Electronically to        [facility]ail[facility]       [address] [place] [id number]      Ph: [phone]      Fax: [phone]  RxID:   [id number]   Prevention & Chronic Care Immunizations   Influenza vaccine: Fluvax MCR  (05/07/2009)   Influenza vaccine due: 05/09/2009    Tetanus booster: 10/09/2009: Tdap   Tetanus booster due: 03/06/2009    Pneumococcal vaccine: Done.  (04/06/1999)   Pneumococcal vaccine due: None  Colorectal Screening   Hemoccult: Not documented   Hemoccult due: Not Indicated    Colonoscopy: Done.  (04/05/2006)   Colonoscopy due: 04/05/2016  Other Screening   Pap smear: NEGATIVE FOR INTRAEPITHELIAL LESIONS OR MALIGNANCY.  (05/09/2008)   Pap smear due: 05/10/2011    Mammogram: BI-RADS CATEGORY 3:  Probably benign finding(s) - short interval^MM DIGITAL DIAGNOSTIC BILAT  (04/11/2009)   Mammogram due: 04/2010   Smoking status: current  (10/09/2009)   Smoking cessation counseling: yes  (06/04/2008)  Lipids   Total Cholesterol: 167  (06/17/2007)   Lipid panel action/deferral: Lipid Panel ordered   LDL: 93  (06/17/2007)   LDL Direct: 129   (07/23/2006)   HDL: 43  (06/17/2007)   Triglycerides: 155  (06/17/2007)    SGOT (AST): 21  (10/06/2007)   BMP action: Ordered   SGPT (ALT): 16  (10/06/2007) CMP ordered    Alkaline phosphatase: 113  (10/06/2007)   Total bilirubin: 0.6  (10/06/2007)    Lipid flowsheet reviewed?: Yes   Progress toward LDL goal: Unchanged  Self-Management Support :   Personal Goals (by the next clinic visit) :      Personal LDL goal: 130  (10/09/2009)    Lipid self-management support: Written self-care plan  (10/09/2009)   Lipid self-care plan printed.   Immunizations Administered:  Tetanus Vaccine:    Vaccine Type: Tdap    Site: left deltoid    Mfr: GlaxoSmithKline    Dose: 0.5 ml    Route: IM    Given by: LIsabelle Course   Exp. Date: 09/27/2011    Lot #: [id number]   VIS given: 05/24/07 version given October 09, 2009.  Physician counseled: yes

## 2010-08-29 ENCOUNTER — Other Ambulatory Visit: Payer: Self-pay | Admitting: Family Medicine

## 2010-08-29 ENCOUNTER — Telehealth: Payer: Self-pay | Admitting: Family Medicine

## 2010-08-29 DIAGNOSIS — E785 Hyperlipidemia, unspecified: Secondary | ICD-10-CM

## 2010-08-29 DIAGNOSIS — I10 Essential (primary) hypertension: Secondary | ICD-10-CM

## 2010-08-29 NOTE — Telephone Encounter (Signed)
Refill request

## 2010-08-29 NOTE — Telephone Encounter (Signed)
Patient asking about her meds.  Did not appear that Dr. Andria Frames had refilled them so I went ahead and did them.

## 2010-08-29 NOTE — Telephone Encounter (Signed)
error 

## 2010-09-24 ENCOUNTER — Encounter: Payer: Self-pay | Admitting: Family Medicine

## 2010-09-24 ENCOUNTER — Ambulatory Visit (INDEPENDENT_AMBULATORY_CARE_PROVIDER_SITE_OTHER): Payer: Medicare Other | Admitting: Family Medicine

## 2010-09-24 VITALS — BP 135/75 | HR 99 | Temp 98.6°F

## 2010-09-24 DIAGNOSIS — F329 Major depressive disorder, single episode, unspecified: Secondary | ICD-10-CM

## 2010-09-24 DIAGNOSIS — K5090 Crohn's disease, unspecified, without complications: Secondary | ICD-10-CM

## 2010-09-24 DIAGNOSIS — I10 Essential (primary) hypertension: Secondary | ICD-10-CM

## 2010-09-24 DIAGNOSIS — F17200 Nicotine dependence, unspecified, uncomplicated: Secondary | ICD-10-CM

## 2010-09-24 NOTE — Patient Instructions (Addendum)
See Dr. Valentina Lucks in this practice for smoking cessation.   Double check your med list and call me if you are missing anything. See me in 2-3 months if doing well - sooner if anxiety worse.

## 2010-09-24 NOTE — Progress Notes (Signed)
  Subjective:    Patient ID: [name] [name], female    DOB: 03-05-1951, 60 y.o.   MRN: [id number]  HPI Called ambulance 3 days ago for generalized weakness and SOB also shaking. EMTs checked out and thought heart in good shape and did not transport.  In retrospect, she now believes this was a panic attack.  She has had such attacks years ago.  Stress is high.  Sig other very ill with COPD and pneumonia.  No major cardiac risk factors.  She also had no trauma or recent surg - i.e. No risk factors for PE.    Review of Systems Denies cough, fever or CP     Objective:   Physical Exam Lungs, normal Cardiac RRR without M Abd benign.       Assessment & Plan:

## 2010-09-25 DIAGNOSIS — I10 Essential (primary) hypertension: Secondary | ICD-10-CM | POA: Insufficient documentation

## 2010-09-25 LAB — BASIC METABOLIC PANEL WITH GFR
BUN: 17 mg/dL (ref 6–23)
CO2: 21 mEq/L (ref 19–32)
Chloride: 108 mEq/L (ref 96–112)
Creat: 1.55 mg/dL — ABNORMAL HIGH (ref 0.40–1.20)

## 2010-09-25 NOTE — Assessment & Plan Note (Signed)
Now with anxiety component.  One isolated panic attack.  Will not add Rx unless attacks become more frequent.

## 2010-09-25 NOTE — Assessment & Plan Note (Signed)
Interested in smoking cessation but needs help.

## 2010-09-25 NOTE — Assessment & Plan Note (Signed)
Well controled on HCTZ.  Will check BNP

## 2010-10-07 ENCOUNTER — Ambulatory Visit (INDEPENDENT_AMBULATORY_CARE_PROVIDER_SITE_OTHER): Payer: Medicare Other | Admitting: Pharmacist

## 2010-10-07 ENCOUNTER — Encounter: Payer: Self-pay | Admitting: Pharmacist

## 2010-10-07 VITALS — BP 133/87 | HR 93 | Ht 66.0 in | Wt 153.6 lb

## 2010-10-07 DIAGNOSIS — F17200 Nicotine dependence, unspecified, uncomplicated: Secondary | ICD-10-CM

## 2010-10-07 MED ORDER — NICOTINE 7 MG/24HR TD PT24: 1.0000 | MEDICATED_PATCH | TRANSDERMAL | Status: DC

## 2010-10-07 MED ORDER — NICOTINE 7 MG/24HR TD PT24: MEDICATED_PATCH | TRANSDERMAL | Status: DC

## 2010-10-07 NOTE — Patient Instructions (Addendum)
1)  Continue to work on decreasing your cigarette intake.  2)  Start Nicotine 8m patch on Quit Date.  3)  Quit Date for tobacco - April 10th.  4)  Apply patch once daily in the AM everyday until you return to see Dr. HAndria Framesin May.

## 2010-10-07 NOTE — Progress Notes (Signed)
  Subjective:    Patient ID: [name], female    DOB: May 14, 1951, 60 y.o.   MRN: [id number]  HPI  Patient arrives in good spirits.   She appears highly motivated to quit smoking.   She did NOT bring medications to visit however was able to verify current medication use.   Patient reports >30 years of smoking with 0.5 ppd - estimated 15-20 pack/years.  Successfully quit for 3 months 3 years ago.  Relapsed secondary to stress from Crohn's Dz.  She quit "cold [place]" at that time.   Importance of tobacco cessation: 10 Readiness to tobacco cessation: 10 Confidence in current tobacco cessation attempt: 9  Denies smoking during sleep.   Triggers include coffee, boredom, TV,   Strategies for avoiding cigs:  Jig-saw puzzles, visits other friends/family, Avoids going outside.     Review of Systems     Objective:   Physical Exam        Assessment & Plan:

## 2010-10-07 NOTE — Assessment & Plan Note (Signed)
A:  Mild nicotine abuse history of 30 years with intake of up to 1/2 ppd of [place] lights.  Recently trying to cut back and has used as few at 5 cigs in a single day lately.  Currently motivated to quit due to upcoming surgery (ostomy revision 5/18) and spouse dx with COPD and on oxygen.   P:  Initiate 68m nicotine patch, instructed on purpose, proper use and potential adverse effects.  Quit date 4/10 with option of quitting earlier if she runs of out cigarettes (only has 9 remaining).  Follow up with Rx clinic if fails quit attempt or in 2 months if still using patches at that time.  Next visit with Dr. HAndria Frames(prior to surgery).

## 2010-10-07 NOTE — Progress Notes (Signed)
  Subjective:    Patient ID: [name] [name], female    DOB: 06/11/1951, 60 y.o.   MRN: [id number]  HPI Read and agree with Dr. Valentina Lucks.    Review of Systems     Objective:   Physical Exam        Assessment & Plan:

## 2010-11-07 ENCOUNTER — Observation Stay (HOSPITAL_COMMUNITY)
Admission: EM | Admit: 2010-11-07 | Discharge: 2010-11-08 | Disposition: A | Discharge status: Home / Self Care | Payer: Medicare Other | Attending: Family Medicine | Admitting: Family Medicine

## 2010-11-07 ENCOUNTER — Encounter: Payer: Self-pay | Admitting: Family Medicine

## 2010-11-07 ENCOUNTER — Emergency Department (HOSPITAL_COMMUNITY): Payer: Medicare Other

## 2010-11-07 DIAGNOSIS — K5090 Crohn's disease, unspecified, without complications: Secondary | ICD-10-CM | POA: Insufficient documentation

## 2010-11-07 DIAGNOSIS — I10 Essential (primary) hypertension: Secondary | ICD-10-CM

## 2010-11-07 DIAGNOSIS — Z888 Allergy status to other drugs, medicaments and biological substances status: Secondary | ICD-10-CM | POA: Insufficient documentation

## 2010-11-07 DIAGNOSIS — E785 Hyperlipidemia, unspecified: Secondary | ICD-10-CM | POA: Insufficient documentation

## 2010-11-07 DIAGNOSIS — F17200 Nicotine dependence, unspecified, uncomplicated: Secondary | ICD-10-CM | POA: Insufficient documentation

## 2010-11-07 DIAGNOSIS — R079 Chest pain, unspecified: Principal | ICD-10-CM | POA: Insufficient documentation

## 2010-11-07 DIAGNOSIS — E876 Hypokalemia: Secondary | ICD-10-CM | POA: Insufficient documentation

## 2010-11-07 DIAGNOSIS — R0789 Other chest pain: Secondary | ICD-10-CM

## 2010-11-07 DIAGNOSIS — M25519 Pain in unspecified shoulder: Secondary | ICD-10-CM | POA: Insufficient documentation

## 2010-11-07 LAB — COMPREHENSIVE METABOLIC PANEL
ALT: 18 U/L (ref 0–35)
Alkaline Phosphatase: 107 U/L (ref 39–117)
BUN: 23 mg/dL (ref 6–23)
CO2: 17 mEq/L — ABNORMAL LOW (ref 19–32)
Calcium: 9.6 mg/dL (ref 8.4–10.5)
GFR calc non Af Amer: 38 mL/min — ABNORMAL LOW (ref >60)
Glucose, Bld: 99 mg/dL (ref 70–99)
Sodium: 134 mEq/L — ABNORMAL LOW (ref 135–145)

## 2010-11-07 LAB — LIPID PANEL: LDL Cholesterol: 65 mg/dL (ref 0–99)

## 2010-11-07 LAB — CARDIAC PANEL(CRET KIN+CKTOT+MB+TROPI)
Relative Index: 1.3 (ref 0.0–2.5)
Total CK: 272 U/L — ABNORMAL HIGH (ref 7–177)
Troponin I: <0.3 ng/mL (ref <0.30)

## 2010-11-07 LAB — CBC
HCT: 31.9 % — ABNORMAL LOW (ref 36.0–46.0)
MCV: 91.1 fL (ref 78.0–100.0)
RDW: 13.5 % (ref 11.5–15.5)
WBC: 7.9 10*3/uL (ref 4.0–10.5)

## 2010-11-07 LAB — CK TOTAL AND CKMB (NOT AT ARMC)
CK, MB: 3.9 ng/mL (ref 0.3–4.0)
Total CK: 307 U/L — ABNORMAL HIGH (ref 7–177)

## 2010-11-07 LAB — POCT CARDIAC MARKERS
CKMB, poc: 2.4 ng/mL (ref 1.0–8.0)
CKMB, poc: 2.7 ng/mL (ref 1.0–8.0)
Myoglobin, poc: 250 ng/mL (ref 12–200)

## 2010-11-07 LAB — HEMOGLOBIN A1C
Hgb A1c MFr Bld: 5.7 % — ABNORMAL HIGH (ref <5.7)
Mean Plasma Glucose: 117 mg/dL — ABNORMAL HIGH (ref <117)

## 2010-11-07 NOTE — H&P (Signed)
[facility] Admission History and Physical  Patient name: [name] [name] Medical record number: [id number] Date of birth: 1951/04/14 Age: 60 y.o. Gender: female  Primary Care Provider: Zigmund Gottron, MD  Chief Complaint: Chest Pain History of Present Illness: [name] [name] is a 60 y.o. year old female presenting with chest pain.  Pain awoke her from sleep this am.  Initially thought this was an anxiety attack but pain persisted.  Describes pain as left sided pressure and feels "like something sitting on my chest."  Pain is not associated with any certain activity.  Pain radiates into upper left arm and she did feel shortness of breath during the episode.  She received nitroglycerin from EMS on the way to the hospital which eased her pain, and received nitroglycerin again upon arrival to the ED which resolved her pain.  Upon seeing her she is chest pain free.  She denies nausea, vomiting, headache, palpitations, abdominal pain, edema. Of note, she is supposed to have upcoming ostomy revision with Dr. [name] this month as well.     Patient Active Problem List  Diagnoses  . HYPERCHOLESTEROLEMIA  . TOBACCO DEPENDENCE  . DEPRESSIVE DISORDER, NOS  . NEUROPATHY, PERIPHERAL  . REFLUX ESOPHAGITIS  . CROHN'S DISEASE  . Hypertension, benign    Past Surgical History: Past Surgical History  Procedure Date  . Cholecystectomy, laparoscopic 01/28/05  . 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 Salpingectomy     left    Social History: History   Social History  . Marital Status: Single    Spouse Name: N/A    Number of Children: N/A  . Years of Education: N/A   Social History Main Topics  . Smoking status: Current Everyday Smoker -- 0.5 packs/day for 30 years    Types: Cigarettes  . Smokeless tobacco: Never Used  . Alcohol Use: No  . Drug Use: No  . Sexually Active: Not on file      Family History: Mother-CAD, CVA, HTN Brothers x5: Death from  MI  Allergies: Allergies  Allergen Reactions  . Amoxicillin Anaphylaxis and Rash    REACTION:Throat Swelling  . Aspirin Itching and Rash    REACTION: Rash only.  NO breathing issues with use.   Able to tolerate ASACOL    Current Outpatient Prescriptions  Medication Sig Dispense Refill  . calcium-vitamin D (OSCAL WITH D 500-200) 500-200 MG-UNIT per tablet Take 1 tablet by mouth 2 (two) times daily.       Marland Kitchen gabapentin (NEURONTIN) 100 MG capsule Take 100 mg by mouth 3 (three) times daily.        . hydrochlorothiazide (,MICROZIDE/HYDRODIURIL,) 12.5 MG capsule Take 1 capsule (12.5 mg total) by mouth every morning.  90 capsule  3  . mesalamine (ASACOL) 400 MG EC tablet Take 400 mg by mouth as directed. 3 tabs twice daily      . multivitamin (THERAGRAN) per tablet Take 1 tablet by mouth daily.      . nicotine (NICODERM CQ - DOSED IN MG/24 HR) 7 mg/24hr patch Dispense 37m patch that is covered by medicaid if possible.   Brand per pharmacy selection. Thanks  30 patch  1  . simvastatin (ZOCOR) 20 MG tablet Take 1 tablet (20 mg total) by mouth at bedtime.  30 tablet  10   Review Of Systems: Per HPI  Otherwise 12 point review of systems was performed and was unremarkable.  Physical Exam: Pulse: 72  Blood Pressure: 130/80 RR: 18   O2:  100 on 2L Temp: 98.0  General: alert, cooperative and no distress, mildly anxious appearing HEENT: PERRLA, extra ocular movement intact, sclera clear, anicteric, oropharynx clear, no lesions, neck supple with midline trachea and thyroid without masses Heart: S1, S2 normal, no murmur, rub or gallop, regular rate and rhythm Lungs: clear to auscultation, no wheezes or rales and unlabored breathing Abdomen: abdomen is soft without significant tenderness, masses, organomegaly or guarding.  Ostomy in place with bag, no surrounding erythema. Extremities: extremities normal, atraumatic, no cyanosis or edema Skin:no rashes Neurology: normal without focal findings and mental  status, speech normal, alert and oriented x3  Labs and Imaging: Lab Results  Component Value Date/Time   NA 134* 11/07/2010  3:11 AM   K 3.1* 11/07/2010  3:11 AM   CL 106 11/07/2010  3:11 AM   CO2 17* 11/07/2010  3:11 AM   BUN 23 11/07/2010  3:11 AM   CREATININE 1.42* 11/07/2010  3:11 AM   CREATININE 1.55* 09/24/2010  4:29 PM   GLUCOSE 99 11/07/2010  3:11 AM   Lab Results  Component Value Date   WBC 7.9 11/07/2010   HGB 11.1* 11/07/2010   HCT 31.9* 11/07/2010   MCV 91.1 11/07/2010   PLT 156 11/07/2010   Lipids Lab Results  Component Value Date   CHOL 143 11/07/2010    HDL 36* 11/07/2010    LDLCALC  Value: 65         11/07/2010    TRIG 209* 11/07/2010    CHOLHDL 4.0 11/07/2010    POC CE:  Trop: <0.05, CK-MB: 2.4 CXR:  Emphysema without acute cardiopulmonary disease Assessment and Plan: [name] [name] is a 60 y.o. year old female presenting with chest pain 1. Chest pain:  Chest pain is somewhat typical sounding in nature.  Will admit to telemetry bed to monitor.  Will cycle cardiac enzymes and repeat EKG.  Risk stratify with FLP, A1C, and TSH.  Has aspirin listed as an allergy but able to tolerate asacol and listed to only cause rash, however patient states she has throat swelling with ASA.  Will use plavix for now and try to clarify ASA allergy with PCP.  Will use nitroglycerin and morphine for pain control as needed.  Patient very anxious during encounter, possible anxiety component to chest pain.  Morphine may help somewhat with this, may consider anti-anxiolytic if still having symptoms unrelieved with nitro or morphine.  2. HLD:  Continue simvastatin, Lipid panel shows low HDL but LDL is at goal 3. HTN:  Continue HCTZ and monitor pressures 4. Crohn's:  Continue asacol, will have ostomy revision in late may with Dr. [name].  Will discuss with primary team whether patient may need additional cardiac testing in the setting of chest pain and surgery in the near future. 5. Peripheral Neuropathy:  Continue  gabapentin 6. Tobacco Dependance:  Will give nicotine patch while in the hospital, interested in quitting will also order smoking cessation consult.  7. FEN/GI: Heart healthy diet, SLIV 8. Prophylaxis: Protonix and Heparin TID 9. Disposition: Pending further clinical work up and improvement. 10. Code Status:  Full Code

## 2010-11-08 ENCOUNTER — Observation Stay (HOSPITAL_COMMUNITY): Payer: Medicare Other

## 2010-11-08 LAB — BASIC METABOLIC PANEL
BUN: 24 mg/dL — ABNORMAL HIGH (ref 6–23)
CO2: 19 mEq/L (ref 19–32)
Chloride: 109 mEq/L (ref 96–112)
Creatinine, Ser: 1.23 mg/dL — ABNORMAL HIGH (ref 0.4–1.2)
Glucose, Bld: 91 mg/dL (ref 70–99)

## 2010-11-08 MED ORDER — TECHNETIUM TC 99M TETROFOSMIN IV KIT
10.0000 | PACK | Freq: Once | INTRAVENOUS | Status: AC | PRN
  Administered 2010-11-08: 10 via INTRAVENOUS

## 2010-11-08 MED ORDER — TECHNETIUM TC 99M TETROFOSMIN IV KIT
30.0000 | PACK | Freq: Once | INTRAVENOUS | Status: AC | PRN
  Administered 2010-11-08: 30 via INTRAVENOUS

## 2010-11-09 ENCOUNTER — Other Ambulatory Visit (HOSPITAL_COMMUNITY): Payer: Medicare Other

## 2010-11-11 NOTE — Consult Note (Signed)
[name] [name], [name] [name]                 ACCOUNT NO.:  [id number]  MEDICAL RECORD NO.:  [id number]           PATIENT TYPE:  E  LOCATION:  [facility]                         FACILITY:  [facility]  PHYSICIAN:  [name] [name], MDDATE OF BIRTH:  03-Feb-1951  DATE OF CONSULTATION:  11/07/2010 DATE OF DISCHARGE:                                CONSULTATION   CHIEF COMPLAINT:  Chest pain.  HISTORY OF PRESENT ILLNESS:  [name] [name] is a pleasant 60 year old female with no prior history of coronary artery disease or MI.  She presented to the emergency room on Nov 07, 2010, with substernal chest pain that awakened her at about 2 a.m.  She describes a left upper chest pain. Initially, it was like someone sitting on her chest and then she said it was a pinching pain.  It did seem to radiate to her left shoulder and her left lateral chest.  She denies any nausea or vomiting or diaphoresis, but did say she felt short of breath.  She has not had pain like this previously.  She denies any recent exertional chest pain or dyspnea.  She took one of her Crohn medications at home with no relief. EMS was called.  She received a nitroglycerin tablet per EMS, she said it took about 15 or 20 minutes, but she ultimately received relief of her chest pain.  She was admitted by the [facility].  So far, her enzymes are negative x3.  Her EKG is currently unavailable to me.  We were asked to see her for consult for further evaluation.  PAST MEDICAL HISTORY:  Remarkable for: 1. Crohn colitis. 2. She has had previous colostomy. 3. She has dyslipidemia. 4. She has COPD which is documented by x-ray. 5. She has gastroesophageal reflux. 6. She has a history of peripheral neuropathy. 7. She has a history of depression.  Previous surgeries include laparoscopic cholecystectomy in 2006, and colostomy in 1997.  Home medications include: 1. Vitamin D. 2. Neurontin 100 mg t.i.d. 3. Hydrochlorothiazide 12.5 mg daily. 4.  Asacol 400 mg 3 tablets twice daily. 5. Multivitamin daily. 6. NicoDerm patch. 7. Zocor 20 mg a day.  She is allergic to AMOXICILLIN which caused anaphylaxis and a rash and ASPIRIN which causes a rash.  SOCIAL HISTORY:  She is not married.  She has one daughter.  She lives with her significant other.  She smokes a pack a day.  She does not drink alcohol.  She is currently unemployed.  FAMILY HISTORY:  Remarkable for coronary artery disease in her father and 5 of her brothers.  REVIEW OF SYSTEMS:  Essentially unremarkable except for noted above. See history and physical for complete details of review of systems.  PHYSICAL EXAMINATION:  GENERAL:  She is a well-developed, well-nourished [place] American female in no acute distress. VITAL SIGNS:  Blood pressure 129/79, pulse 79, temperature 98.6, O2 sat is 99 on room air. GENERAL:  She is a well-developed, well-nourished African American female in no acute distress. HEENT:  Normocephalic.  She has poor dentition. NECK:  Without bruit or JVD. CHEST:  Clear lung fields with some dry  crackles at the right base. CARDIAC:  Regular rate and rhythm without murmur, rub, or gallop. Normal S1, S2. ABDOMEN:  Nontender, nondistended.  No bruits. EXTREMITIES:  Without edema.  Distal pulses are 3+/4 bilaterally.  No bruits. NEUROLOGIC:  Grossly intact.  She is awake, alert, oriented, cooperative.  Moves all extremities without obvious deficit. SKIN:  Cool and dry.  LABORATORY DATA:  White count 7.9, hemoglobin 11.1, hematocrit 31.9, and platelets 156.  Sodium 134, potassium 3.1, BUN 23, creatinine 1.42. Liver functions are normal.  CK is 307, MB is negative, troponin is negative x3, cholesterol is 143, triglycerides 209, HDL 36, LDL 65. Chest x-ray shows emphysema.  EKG is pending.  IMPRESSION: 1. Chest pain worrisome for unstable angina. 2. Chronic obstructive pulmonary disease and smoking. 3. Treated hypertension. 4. Stage III renal  insufficiency, this may be mild dehydration     secondary to hydrochlorothiazide therapy. 5. Crohn colitis status post colostomy. 6. History of peripheral neuropathy. 7. History of gastroesophageal reflux. 8. Family history of coronary artery disease.  PLAN:  The patient will be seen by Dr. Ellyn [name].  We will go ahead and hold her hydrochlorothiazide and replace her potassium.  He will discuss with the patient further evaluation of her symptoms including possible coronary angiogram versus a Lexiscan Myoview.     [name] [name], P.A.   ______________________________ [name] Porter, MD    LKK/MEDQ  D:  11/07/2010  T:  11/07/2010  Job:  [id number]  Electronically Signed by [name] [name] P.A. on 11/07/2010 05:31:31 PM Electronically Signed by [name] Hew MD on 11/11/2010 09:14:40 AM MedRecNo: [id number] [facility], Account: [id number], DocSeq: [id number] I saw and examined the patient shortly after she was seen by [name] [name].  I essentially agree with his findings and recommendations.  Her symptoms are somewhat atypical for angina, but she does have significant risk factors deliniated by [name] [name].  I  had a long discussion with the patient as to options of invasive (heart catheterization) vs. non-invasive evaluation (TM or Lexiscan Myoview).  After considering these options along with the respective risks/benefits and alternatives, she decided to  proceed with non-invasive evaluation. We will schedule her for a Cardiolite Stress Test at the earliest possible opportunity. We will follow along. Electronically Signed by [name] Hew MD on 11/11/2010 09:14:36 AM

## 2010-11-14 ENCOUNTER — Other Ambulatory Visit (HOSPITAL_COMMUNITY): Payer: Medicare Other

## 2010-11-18 ENCOUNTER — Emergency Department (HOSPITAL_COMMUNITY)
Admission: EM | Admit: 2010-11-18 | Discharge: 2010-11-18 | Disposition: A | Discharge status: Home / Self Care | Payer: Medicare Other | Attending: Emergency Medicine | Admitting: Emergency Medicine

## 2010-11-18 ENCOUNTER — Emergency Department (HOSPITAL_COMMUNITY): Payer: Medicare Other

## 2010-11-18 DIAGNOSIS — I10 Essential (primary) hypertension: Secondary | ICD-10-CM | POA: Insufficient documentation

## 2010-11-18 DIAGNOSIS — R059 Cough, unspecified: Secondary | ICD-10-CM | POA: Insufficient documentation

## 2010-11-18 DIAGNOSIS — K219 Gastro-esophageal reflux disease without esophagitis: Secondary | ICD-10-CM | POA: Insufficient documentation

## 2010-11-18 DIAGNOSIS — K5090 Crohn's disease, unspecified, without complications: Secondary | ICD-10-CM | POA: Insufficient documentation

## 2010-11-18 DIAGNOSIS — F17200 Nicotine dependence, unspecified, uncomplicated: Secondary | ICD-10-CM | POA: Insufficient documentation

## 2010-11-18 DIAGNOSIS — E785 Hyperlipidemia, unspecified: Secondary | ICD-10-CM | POA: Insufficient documentation

## 2010-11-18 DIAGNOSIS — R0789 Other chest pain: Secondary | ICD-10-CM | POA: Insufficient documentation

## 2010-11-18 DIAGNOSIS — R05 Cough: Secondary | ICD-10-CM | POA: Insufficient documentation

## 2010-11-18 LAB — CBC
MCH: 32 pg (ref 26.0–34.0)
MCV: 91.1 fL (ref 78.0–100.0)
Platelets: 179 10*3/uL (ref 150–400)
RBC: 3.5 MIL/uL — ABNORMAL LOW (ref 3.87–5.11)
RDW: 13.4 % (ref 11.5–15.5)

## 2010-11-18 LAB — BASIC METABOLIC PANEL
BUN: 16 mg/dL (ref 6–23)
Chloride: 103 mEq/L (ref 96–112)
Potassium: 3.3 mEq/L — ABNORMAL LOW (ref 3.5–5.1)

## 2010-11-18 LAB — POCT CARDIAC MARKERS: Troponin i, poc: <0.05 ng/mL (ref 0.00–0.09)

## 2010-11-19 ENCOUNTER — Other Ambulatory Visit: Payer: Self-pay | Admitting: General Surgery

## 2010-11-19 ENCOUNTER — Encounter (HOSPITAL_COMMUNITY): Payer: Medicare Other | Attending: General Surgery

## 2010-11-19 DIAGNOSIS — IMO0002 Reserved for concepts with insufficient information to code with codable children: Secondary | ICD-10-CM | POA: Insufficient documentation

## 2010-11-19 DIAGNOSIS — Z01812 Encounter for preprocedural laboratory examination: Secondary | ICD-10-CM | POA: Insufficient documentation

## 2010-11-19 DIAGNOSIS — R0789 Other chest pain: Secondary | ICD-10-CM | POA: Insufficient documentation

## 2010-11-19 LAB — COMPREHENSIVE METABOLIC PANEL
BUN: 14 mg/dL (ref 6–23)
CO2: 23 mEq/L (ref 19–32)
Chloride: 101 mEq/L (ref 96–112)
Creatinine, Ser: 1.3 mg/dL — ABNORMAL HIGH (ref 0.4–1.2)
GFR calc non Af Amer: 42 mL/min — ABNORMAL LOW (ref >60)
Glucose, Bld: 91 mg/dL (ref 70–99)
Total Bilirubin: 0.5 mg/dL (ref 0.3–1.2)

## 2010-11-19 LAB — DIFFERENTIAL
Eosinophils Relative: 1 % (ref 0–5)
Lymphocytes Relative: 36 % (ref 12–46)
Lymphs Abs: 2.6 10*3/uL (ref 0.7–4.0)
Monocytes Absolute: 0.6 10*3/uL (ref 0.1–1.0)
Monocytes Relative: 8 % (ref 3–12)

## 2010-11-19 LAB — CBC
HCT: 35.5 % — ABNORMAL LOW (ref 36.0–46.0)
MCV: 92.2 fL (ref 78.0–100.0)
RDW: 13.5 % (ref 11.5–15.5)
WBC: 7.2 10*3/uL (ref 4.0–10.5)

## 2010-11-19 LAB — SURGICAL PCR SCREEN: MRSA, PCR: NEGATIVE

## 2010-11-21 ENCOUNTER — Ambulatory Visit (HOSPITAL_COMMUNITY)
Admission: RE | Admit: 2010-11-21 | Discharge: 2010-11-21 | Disposition: A | Discharge status: Home / Self Care | Payer: Medicare Other | Source: Ambulatory Visit | Attending: General Surgery | Admitting: General Surgery

## 2010-11-21 ENCOUNTER — Telehealth: Payer: Self-pay | Admitting: Family Medicine

## 2010-11-21 NOTE — Telephone Encounter (Signed)
Reviewed DC summary.  Neg cardiac enzymes and neg myoview.  Pain thought to be GI and she was DCed on protonix.  Also, DCed on plavix (for primary prevention?  ASA allergic.)  Told to stop plavix.  She has no known cardiac disease.

## 2010-11-21 NOTE — H&P (Signed)
[name] [name], [name] [name]                 ACCOUNT NO.:  [id number]   MEDICAL RECORD NO.:  [id number]          PATIENT TYPE:  EMS   LOCATION:  [facility]                         FACILITY:  [facility]   PHYSICIAN:  Merri Ray. Grandville Silos, M.D.DATE OF BIRTH:  1951/06/30   DATE OF ADMISSION:  10/21/2004  DATE OF DISCHARGE:                                HISTORY & PHYSICAL   CHIEF COMPLAINT:  Right upper quadrant abdominal pain.   HISTORY OF PRESENT ILLNESS:  The patient is a 60 year old female who has a  history of Crohn's disease. She is known to Dr. Jeanella Anton from our  service. She has had colectomies in the past with a colostomy. She developed  right upper quadrant abdominal pain last night with associated nausea. She  came to the emergency department for evaluation. She underwent CT scan of  the abdomen and pelvis which showed a distended gallbladder with likely  gallstones in the neck. Subsequently, she underwent ultrasound which shows a  1.9 cm gallstone in the gallbladder neck with some wall thickening. She also  has a positive sonographic Murphy's sign. She is continuing to have some  pain in her right upper quadrant.   PAST MEDICAL HISTORY:  Crohn's disease.   PAST SURGICAL HISTORY:  Colectomy and colostomy as described above.   SOCIAL HISTORY:  She lives in [place]. She smokes one-half pack per day,  drinks rarely. She denies drug use.   CURRENT MEDICATIONS:  Include Asacol three p.o. b.i.d. and what she  describes as Purinethol one-half daily.   ALLERGIES:  ASPIRIN and PENICILLIN and she describes anaphylactic symptoms.   FAMILY HISTORY:  Her mother is alive and has had MI and stroke. Her father  is alive and has had a stroke.   REVIEW OF SYSTEMS:  CONSTITUTIONAL:  Negative. HEENT:  Negative. SKIN:  Negative. CARDIAC:  Negative. GU:  Negative. GI:  Positive for nausea and  abdominal pain as above. The remainder of review of systems was negative.   PHYSICAL EXAMINATION:  VITAL  SIGNS:  Temperature is 99.6, pulse 87,  respirations 23, blood pressure 151/85.  GENERAL:  She is awake and alert.  HEENT:  Normocephalic, atraumatic. Pupils are reactive, sclerae are clear.  NECK:  Supple with no tenderness.  LYMPH:  There is no periumbilical, supraclavicular, or cervical adenopathy.  HEART:  Regular rate and rhythm. PMI is palpable in the left chest.  LUNGS:  Clear to auscultation, respiratory excursion is normal.  ABDOMEN:  Soft. She has right upper quadrant tenderness with mild guarding  and positive Murphy's sign. No hepatosplenomegaly is noted. She has a large  prolapsing colostomy in the left lower quadrant. Abdomen is otherwise soft.  EXTREMITIES:  Have no clubbing, cyanosis, or edema.  NEUROLOGIC:  She is alert and oriented x3. There is no focal deficit in  upper or lower extremities.   DATA REVIEWED:  Includes ultrasound and CT findings as above. Total  bilirubin 0.9, AST 45, ALT 70. Cardiac enzymes negative x1.   IMPRESSION AND PLAN:  Acute cholecystitis. The plan will be to admit the  patient. We will give  her intravenous antibiotics and take her to the  operating room for laparoscopic cholecystectomy and intraoperative  cholangiogram. The procedure, risks, and benefits were discussed with the  patient including the potential for conversion to open procedure which is  higher due to her previous surgeries on her abdomen. All her questions were  answered and she is agreeable.      BET/MEDQ  D:  10/21/2004  T:  10/21/2004  Job:  [id number]

## 2010-11-21 NOTE — Op Note (Signed)
[name], [name] [name]                             ACCOUNT NO.:  [id number]   MEDICAL RECORD NO.:  [id number]                   PATIENT TYPE:  AMB   LOCATION:  ENDO                                 [facility]AC[facility]   PHYSICIAN:  [name] [name], [name]                 DATE OF BIRTH:  [birth year]   DATE OF PROCEDURE:  12/21/2002  DATE OF DISCHARGE:                                 OPERATIVE REPORT   PROCEDURE:  Colonoscopy.   INDICATION FOR PROCEDURE:  This patient with a long history of Crohn's  disease, status post extensive colonic resection and permanent colostomy,  who has had no colonoscopic surveillance in the last five years.   DESCRIPTION OF PROCEDURE:  The patient was placed in the left lateral  decubitus position and placed on the pulse monitor with continuous low-flow  oxygen delivered by nasal cannula.  She was sedated with 62.5 mcg IV  fentanyl and 6 mg IV Versed.  The Olympus video colonoscope was inserted  into her ostomy site and then advanced for several centimeters.  There was a  lot of spasm and due to the ostomy, she did not hold air well.  After  examining about 30 cm of lumen, it was clear that the lumen had the  appearance of small intestine, and this was explored up to about 50 cm.  On  careful withdrawal, the mucosa seemed normal, but I could not identify a  discrete transition to colon or an obvious surgical anastomosis.  I do have  records of her prior surgery available to try to correlate with her expected  anatomy from the surgery she had.  I do not believe that she had an  ileostomy, but I suppose this is possible.  At any rate, the mucosa appeared  normal all the way down to the ostomy with no masses, polyps, or other  abnormalities.  The scope was then withdrawn and the patient returned to the  recovery room in stable condition.  She tolerated the procedure well, and  there were no immediate complications.   IMPRESSION:  Normal study through ostomy with  indistinct transition between  small and large intestine.   PLAN:  I will review operative notes to see the exact type of surgery she  had.                                               [name] [name], [name]    JCH/MEDQ  D:  12/21/2002  T:  12/21/2002  Job:  [id number]   cc:   [name] [name], [name]  [address] [place]  [place] [address]  Fax: Maryhill Hensel, [name]  [address] [place]  [place]  [address]  Fax: [phone]

## 2010-11-21 NOTE — Telephone Encounter (Signed)
Pt came by today drop off a letter from Dr [name], [facility] wants to know if she needs an appt to see you. Letter was placed in your box.

## 2010-11-21 NOTE — Discharge Summary (Signed)
[name] [name], [name] [name]                 ACCOUNT NO.:  [id number]   MEDICAL RECORD NO.:  [id number]          PATIENT TYPE:  INP   LOCATION:  5727                         FACILITY:  [facility]   PHYSICIAN:  Aasiya Creasey                DATE OF BIRTH:  Feb 22, 1951   DATE OF ADMISSION:  10/21/2004  DATE OF DISCHARGE:  10/23/2004                                 DISCHARGE SUMMARY   DISCHARGE DIAGNOSES:  1. Acute appendicitis status post laparoscopic cholecystectomy on October 21, 2004 by Dr. Georganna Skeans.  2. History of colectomy with colostomy three years ago under the care of      Dr. [name].  3. Tobacco abuse.     HOSPITAL COURSE:  Ms. Linebaugh is a 60 year old female who presented to the  emergency room with a one-day history of right upper quadrant pain and  nausea.  Gallbladder ultrasound showed a 1.9 cm gallstone in the gallbladder  neck with wall thickening along with a positive Murphy's sign.  She was then  taken emergently to the operating room where she underwent a laparoscopic  cholecystectomy under the care of Dr. Georganna Skeans.  She tolerated the  procedure well and returned to the postanesthesia care unit in good  condition.  The following day she did run a low grade fever with a Tmax of  100.7.  She now is afebrile and on postoperative day #2 ready to go home.   DISCHARGE MEDICATIONS:  1. She is to resume her medications for her Crohn's disease.  2. Also, we have added Vicodin 5/500 one to two tablets every six hours as      needed for pain.   She may clean over her wound gently with soap and water, no scrubbing.  She  is not to drive for two days.  No  lifting over 10 pounds for two weeks.  She may shower and bathe and may walk  up steps.  She is to follow up with Dr. Georganna Skeans in two weeks and I  have given her our number and also asked our office to call her with this  appointment.      LB/MEDQ  D:  10/23/2004  T:  10/23/2004  Job:  [id number]   cc:   Merri Ray.  Grandville Silos, [name]  [facility]  [place], [place] [address]   Jamal Collin Andria Frames, [name]  Fax: [phone]

## 2010-11-21 NOTE — Op Note (Signed)
[name] [name], [name] [name]                 ACCOUNT NO.:  [id number]   MEDICAL RECORD NO.:  [id number]          PATIENT TYPE:  INP   LOCATION:  8185                         FACILITY:  [facility]   PHYSICIAN:  Merri Ray. Grandville Silos, M.D.DATE OF BIRTH:  1950-12-26   DATE OF PROCEDURE:  10/21/2004  DATE OF DISCHARGE:                                 OPERATIVE REPORT   PREOPERATIVE DIAGNOSIS:  Acute cholecystitis.   POSTOPERATIVE DIAGNOSIS:  Acute cholecystitis.   PROCEDURES:  Laparoscopic cholecystectomy.   SURGEON:  Georganna Skeans, [name]   ASSISTANT:  Fenton Malling. Lucia Gaskins, [name]   ANESTHESIA:  General.   HISTORY OF PRESENT ILLNESS:  The patient is a 60 year old African-American  female who is known to Dr. Jeanella Anton from our practice. She has  Crohn's disease and has had a right colectomy and left colectomy in the  past, and she has a colostomy. She presented with a one-day history of right  upper quadrant pain; history, physical and radiographic findings were all  consistent with acute cholecystitis. She is brought urgently to the  operating room.   PROCEDURE IN DETAIL:  The patient was identified and informed consent was  obtained. She received intravenous antibiotics. She was brought to the  operating room. General anesthesia was administered. Her abdomen was prepped  and draped in a sterile fashion, draping the colostomy off and away from our  sterile field.  Due to her previous midline incision, we started with an  epigastric incision. Subcutaneous tissues were dissected down, revealing the  anterior fascia, which was divided sharply. The peritoneal cavity was then  entered gradually under direct vision without difficulty, and 11 mm trocar  was placed into the abdomen. Marcaine 0.25% with epinephrine was used at all  port sites; this allowed insufflation of the abdomen. We placed two 5 mm  lateral ports and then took down some filmy adhesions from the lower  midline, facilitating placement  of another 11 mm port under direct vision.  Once this was accomplished, the gallbladder was decompressed with the Najat  suction system. The dome of the gallbladder was retracted superomedially.  The infundibulum was retracted inferolaterally and dissection revealed a  very edematous gallbladder.  Some filmy adhesions were taken down.  Dissection began laterally and progressed medially, easily identifying both  the cystic artery and the cystic duct.  The cystic duct was  circumferentially dissected deep, to create a nice large window between the  infundibulum of the gallbladder, the cystic duct and the liver. Once this  was accomplished with excellent visualization, a clip was placed on the  infundibulo cystic duct junction. A small nick was attempted to be made in  the cystic duct; however,  the lumen of the cystic duct was so small it was  not possible to perform a cholangiogram. The anatomy was excellent, with  good visualization; so we elected to place three clips proximally on the  cystic duct and it was divided.  Two clips were placed proximally on the  cystic artery and one was placed distally; and it was divided as well.  The  gallbladder  was then taken off the liver bed with Bovie cautery.  A  posterior branch of the cystic artery was encountered, which was clipped  twice proximally and divided with cautery distally.  Once it was removed,  the gallbladder was placed in EndoCatch bag and removed from the abdomen via  the epigastric port site. The gallbladder bed was copiously irrigated.  Meticulous hemostasis was ensured with Bovie cautery, and the abdomen was  thoroughly irrigated. The irrigation fluid returned clear. The liver bed was  rechecked and hemostasis was present. The ports were removed under direct  vision. The pneumoperitoneum was released. The fascial defect from the  midline and epigastric port sites were each closed with a figure-of-eight  zero Vicryl. The wounds  were copiously irrigated. Some additional local  anesthetic was injected, and the skin of each was closed with running 4-0  Vicryl subcuticular stitch. Sponge, needle and instrument counts were  correct. Benzoin, Steri-Strips and sterile dressings were applied. The  patient tolerated the procedure well without apparent complications.  Was  taken to the recovery room in stable condition.      BET/MEDQ  D:  10/21/2004  T:  10/21/2004  Job:  [id number]

## 2010-11-25 ENCOUNTER — Other Ambulatory Visit (INDEPENDENT_AMBULATORY_CARE_PROVIDER_SITE_OTHER): Payer: Medicare Other | Admitting: Family Medicine

## 2010-11-25 DIAGNOSIS — G609 Hereditary and idiopathic neuropathy, unspecified: Secondary | ICD-10-CM

## 2010-11-25 MED ORDER — GABAPENTIN 100 MG PO CAPS: 100.0000 mg | ORAL_CAPSULE | Freq: Three times a day (TID) | ORAL | Status: DC

## 2010-11-28 ENCOUNTER — Encounter: Payer: Self-pay | Admitting: Family Medicine

## 2010-11-28 ENCOUNTER — Ambulatory Visit (INDEPENDENT_AMBULATORY_CARE_PROVIDER_SITE_OTHER): Payer: Medicare Other | Admitting: Family Medicine

## 2010-11-28 VITALS — BP 120/70 | HR 80 | Temp 98.7°F | Ht 66.0 in | Wt 148.3 lb

## 2010-11-28 DIAGNOSIS — K5090 Crohn's disease, unspecified, without complications: Secondary | ICD-10-CM

## 2010-11-28 DIAGNOSIS — I10 Essential (primary) hypertension: Secondary | ICD-10-CM

## 2010-11-28 DIAGNOSIS — F411 Generalized anxiety disorder: Secondary | ICD-10-CM

## 2010-11-28 DIAGNOSIS — F419 Anxiety disorder, unspecified: Secondary | ICD-10-CM

## 2010-11-28 MED ORDER — LORAZEPAM 0.5 MG PO TABS: 0.5000 mg | ORAL_TABLET | Freq: Two times a day (BID) | ORAL | Status: DC | PRN

## 2010-11-28 NOTE — Assessment & Plan Note (Signed)
Well controled on current meds

## 2010-11-28 NOTE — Assessment & Plan Note (Signed)
More anxiety with pending surgery for colostomy and recent  Hospitalization.

## 2010-11-28 NOTE — Assessment & Plan Note (Signed)
Given hospitalization, should be on omeprozole chronically

## 2010-11-28 NOTE — Assessment & Plan Note (Signed)
For colostomy revision by Dr [name]

## 2010-11-28 NOTE — Progress Notes (Signed)
  Subjective:    Patient ID: [name] [name], female    DOB: 1950-09-18, 60 y.o.   MRN: [id number]  HPI Was hospitalized for an episode of substernal chest discomfort and was likely GI in origin.  No pain now that she is on omeprozole.  Was given plavix for primary prevention (ASA allergy) but could not afford.  I told her not needed.    Having surgery to reduce hernia around colostomy.  Dr. [name] is handling.      Review of Systems     Objective:   Physical Exam Cardiac RRR without m  Lungs clear Abd no tenderness       Assessment & Plan:

## 2010-12-03 NOTE — H&P (Signed)
[name] [name], BERGQUIST                 ACCOUNT NO.:  [id number]  MEDICAL RECORD NO.:  [id number]           PATIENT TYPE:  E  LOCATION:  [facility]                         FACILITY:  [facility]  PHYSICIAN:  Blane Ohara Mohd. Derflinger, M.D.DATE OF BIRTH:  1951/03/30  DATE OF ADMISSION:  11/07/2010 DATE OF DISCHARGE:                             HISTORY & PHYSICAL   PRIMARY CARE PROVIDER:  Jamal Collin. Hensel, MD  CHIEF COMPLAINT:  Chest pain.  HISTORY OF PRESENT ILLNESS:  [name] is a 60 year old female presenting with chest pain.  Pain awoke her from sleep this morning.  Initially thought this was an anxiety attack, but pain persisted.  Describes the pain as a left-sided pressure and feels "like something sitting on my chest."  Pain is not associated with any certain activity.  Pain radiates into the upper left arm and she had some shortness of breath during the episode.  She received nitroglycerin from EMS on the way to the hospital, which eased her pain.  She also received nitroglycerin again upon her arrival to the ED, which completely resolved her pain. Upon seeing her in the ED, she is chest pain free.  She denies nausea, vomiting, headache, palpitation, abdominal pain, edema.  Of note, she is supposed to have upcoming ostomy revision with Dr. [name] this month as well.  PAST MEDICAL HISTORY: 1. Hyperlipidemia. 2. Tobacco dependence. 3. History of depression. 4. Peripheral neuropathy. 5. Esophageal reflux. 6. Crohn disease. 7. Hypertension.  PAST SURGICAL HISTORY: 1. Laparoscopic cholecystectomy in 2006. 2. Diverting colostomy in 1997. 3. Salpingectomy.  SOCIAL HISTORY:  Single.  Smokes half-pack per day for 30 years.  Does not use alcohol or illicit drugs.  FAMILY HISTORY:  Mother with coronary artery disease, CVA, and hypertension.  Has had 5 brothers with death from MI.  ALLERGIES: 1. AMOXICILLIN reaction anaphylaxis and rash. 2. ASPIRIN reaction rash.  The patient states she  also has throat swelling with this, although her previous records indicate she has no breathing issues with ACE and is able to tolerate ACE.  CURRENT MEDICATIONS: 1. Os-Cal 500/200 one tablet p.o. b.i.d. 2. Gabapentin 100 mg 1 tablet t.i.d. 3. Hydrochlorothiazide 12.5 mg q.a.m. 4. Asacol 400 mg 3 tablets b.i.d. 5. Multivitamin 1 tablet p.o. daily. 6. Nicotine patch 7 mg daily 7. Simvastatin 20 mg p.o. at bedtime.  REVIEW OF SYSTEMS:  Per HPI, otherwise 12-point review of systems performed and was unremarkable.  PHYSICAL EXAMINATION:  VITAL SIGNS:  Pulse 72, blood pressure 130/80, respiration 18, O2 is 100% on 2 L, temperature 98.0. GENERAL:  Alert, cooperative in no distress, mildly anxious appearing. HEENT:  Pupils equal, round, and reactive to light.  EOMI.  Sclerae clear, anicteric.  Oropharynx clear. NECK:  Supple with midline trachea and thyroid without masses. HEART:  S1 and S2 normal.  No murmur, rub, or gallop.  Regular rate and rhythm. LUNGS:  Clear to auscultation.  No wheezes or rales.  No unlabored breathing. ABDOMEN:  Soft without significant tenderness, masses, organomegaly, or guarding.  Ostomy in place with bag and no surrounding erythema. EXTREMITIES:  Atraumatic.  No cyanosis or edema. SKIN:  Without rashes. NEURO:  No focal findings.  Mental status, speech normal.  Alert and oriented x3.  LABORATORY DATA:  Sodium 134, potassium 3.1, chloride 106, bicarb 70, BUN 23, creatinine 1.42, glucose 99.  WBC is 7.9, hemoglobin 11.1, hematocrit 31.9, platelets 156.  Cholesterol 43, HDL 36, LDL 65, triglycerides 209.  Point-of-care cardiac enzymes, troponin less than 0.05, CK-MB 2.4.  Chest x-ray emphysema without acute cardiopulmonary disease.  ASSESSMENT/PLAN:  [name] [name] is a 60 year old female presenting with chest pain. 1. Chest pain.  Chest pain is somewhat typical sounding in nature.  We     will admit to telemetry bed to monitor.  We will cycle cardiac      enzymes and repeat EKG.  We will check a fasting lipid panel, A1c,     and TSH.  Has aspirin listed in allergy, but able to tolerate     Asacol and listed it to cause rash, however, the patient states     that she has throat swelling with aspirin.  We will use Plavix for     now and try to clarify aspirin allergy with PCP.  Will use     nitroglycerin and morphine for pain control as needed.  The patient     very anxious appearing during encounter, possible anxiety component     to chest pain.  Morphine may help somewhat with this, may consider     anti-anxiolytic if still having symptoms, not relieved with     nitroglycerin or morphine. 2. Hyperlipidemia.  We will continue simvastatin.  Lipid panel shows     low HDL, but LDL is at goal. 3. Hypertension.  Continue hydrochlorothiazide and monitor pressures. 4. Crohn disease.  Continue Asacol.  We will have ostomy revision in     late May with Dr. [name].  We will discuss with primary team     whether the patient may need additional cardiac testing in the     setting of chest pain and surgery in the near future. 5. Peripheral neuropathy.  Continue gabapentin. 6. Tobacco dependence.  We gave nicotine patch while in the hospital,     interested in quitting.  We will also order  smoking cessation     consult. 7. Fluid, electrolytes, and nutrition/gastrointestinal.  Heart healthy     diet, saline lock IV. 8. Prophylaxis.  Protonix and heparin t.i.d. 9. Disposition pending further clinical workup and improvement. 10.Hypokalemia.  This was repleted in the emergency department. 11.Code status full code.    ______________________________ Luetta Nutting, MD   ______________________________ Blane Ohara Sotero Brinkmeyer, [name]    CM/MEDQ  D:  11/07/2010  T:  11/07/2010  Job:  [id number]  Electronically Signed by Luetta Nutting MD on 11/25/2010 01:50:14 PM Electronically Signed by Lissa Morales [name] on 12/03/2010 08:35:36 AM

## 2010-12-05 ENCOUNTER — Emergency Department (HOSPITAL_COMMUNITY)
Admission: EM | Admit: 2010-12-05 | Discharge: 2010-12-06 | Disposition: A | Discharge status: Home / Self Care | Payer: Medicare Other | Attending: Emergency Medicine | Admitting: Emergency Medicine

## 2010-12-05 ENCOUNTER — Other Ambulatory Visit: Payer: Self-pay | Admitting: General Surgery

## 2010-12-05 ENCOUNTER — Encounter (HOSPITAL_COMMUNITY): Payer: Medicare Other

## 2010-12-05 ENCOUNTER — Emergency Department (HOSPITAL_COMMUNITY): Payer: Medicare Other

## 2010-12-05 DIAGNOSIS — I10 Essential (primary) hypertension: Secondary | ICD-10-CM | POA: Insufficient documentation

## 2010-12-05 DIAGNOSIS — M25559 Pain in unspecified hip: Secondary | ICD-10-CM | POA: Insufficient documentation

## 2010-12-05 DIAGNOSIS — K219 Gastro-esophageal reflux disease without esophagitis: Secondary | ICD-10-CM | POA: Insufficient documentation

## 2010-12-05 DIAGNOSIS — E785 Hyperlipidemia, unspecified: Secondary | ICD-10-CM | POA: Insufficient documentation

## 2010-12-05 DIAGNOSIS — K5090 Crohn's disease, unspecified, without complications: Secondary | ICD-10-CM | POA: Insufficient documentation

## 2010-12-05 DIAGNOSIS — S7000XA Contusion of unspecified hip, initial encounter: Secondary | ICD-10-CM | POA: Insufficient documentation

## 2010-12-05 LAB — COMPREHENSIVE METABOLIC PANEL
Albumin: 4.2 g/dL (ref 3.5–5.2)
Alkaline Phosphatase: 136 U/L — ABNORMAL HIGH (ref 39–117)
BUN: 15 mg/dL (ref 6–23)
Chloride: 103 mEq/L (ref 96–112)
Glucose, Bld: 100 mg/dL — ABNORMAL HIGH (ref 70–99)
Potassium: 3.7 mEq/L (ref 3.5–5.1)
Total Bilirubin: 0.6 mg/dL (ref 0.3–1.2)

## 2010-12-05 LAB — DIFFERENTIAL
Lymphocytes Relative: 38 % (ref 12–46)
Lymphs Abs: 2.4 10*3/uL (ref 0.7–4.0)
Monocytes Absolute: 0.5 10*3/uL (ref 0.1–1.0)
Monocytes Relative: 8 % (ref 3–12)
Neutro Abs: 3.3 10*3/uL (ref 1.7–7.7)

## 2010-12-05 LAB — CBC
HCT: 35.4 % — ABNORMAL LOW (ref 36.0–46.0)
Hemoglobin: 11.6 g/dL — ABNORMAL LOW (ref 12.0–15.0)
WBC: 6.3 10*3/uL (ref 4.0–10.5)

## 2010-12-09 ENCOUNTER — Ambulatory Visit (HOSPITAL_COMMUNITY): Admission: RE | Admit: 2010-12-09 | Payer: Medicare Other | Source: Ambulatory Visit | Admitting: General Surgery

## 2010-12-10 ENCOUNTER — Ambulatory Visit: Payer: No Typology Code available for payment source | Admitting: Sports Medicine

## 2010-12-18 ENCOUNTER — Other Ambulatory Visit: Payer: Self-pay

## 2010-12-18 ENCOUNTER — Encounter: Payer: Self-pay | Admitting: Sports Medicine

## 2010-12-18 ENCOUNTER — Ambulatory Visit (INDEPENDENT_AMBULATORY_CARE_PROVIDER_SITE_OTHER): Payer: Self-pay | Admitting: Sports Medicine

## 2010-12-18 ENCOUNTER — Ambulatory Visit (HOSPITAL_COMMUNITY)
Admission: RE | Admit: 2010-12-18 | Discharge: 2010-12-18 | Disposition: A | Discharge status: Home / Self Care | Payer: Medicare Other | Source: Ambulatory Visit | Attending: Cardiology | Admitting: Cardiology

## 2010-12-18 VITALS — BP 125/78 | HR 99 | Temp 98.1°F | Wt 148.0 lb

## 2010-12-18 DIAGNOSIS — Z01818 Encounter for other preprocedural examination: Secondary | ICD-10-CM

## 2010-12-18 DIAGNOSIS — Z01810 Encounter for preprocedural cardiovascular examination: Secondary | ICD-10-CM | POA: Insufficient documentation

## 2010-12-18 NOTE — Assessment & Plan Note (Signed)
Intermediate risk non-cardiac surgery. She has appropriate exercise capacity. BP controlled 12 lead ECG unremarkable. Cleared for int risk non-cardiac surgery.

## 2010-12-18 NOTE — Progress Notes (Signed)
  Subjective:    Patient ID: [name] [name], female    DOB: 02/12/1951, 60 y.o.   MRN: [id number]  HPI Cardiac clearance:  Pt is having revision of ostomy.  Dr. [name] is doing this and needs surgical clearance.  She has had to delay the operation once when on plavix, she has been off this now.  She can easily walk up 2 flights of steps without CP, SOB, presyncope.  She has not cardiac history nor does she have a famHx early CVD.   Review of Systems    See HPI Objective:   Physical Exam  Constitutional: She appears well-developed and well-nourished. No distress.  Neck: Neck supple. No JVD present.  Cardiovascular: Normal rate, regular rhythm and normal heart sounds.  Exam reveals no gallop and no friction rub.   No murmur heard. Pulmonary/Chest: Effort normal and breath sounds normal. No respiratory distress. She has no wheezes. She has no rales. She exhibits no tenderness.  Musculoskeletal: She exhibits no edema.  Skin: Skin is warm and dry.   12 lead ECG:  NSR, rate 69, no ST changes, normal axis.       Assessment & Plan:

## 2010-12-18 NOTE — Patient Instructions (Signed)
Great to see you [place]. You have > 4 mets exercise tolerance and a normal ECG. You are cleared for surgery. Please let Dr. [name] know.   Gwen Her. Dianah Field, [name] [facility] [address] [place] [address] [phone]

## 2011-01-08 ENCOUNTER — Other Ambulatory Visit (INDEPENDENT_AMBULATORY_CARE_PROVIDER_SITE_OTHER): Payer: Self-pay | Admitting: General Surgery

## 2011-01-08 ENCOUNTER — Encounter (HOSPITAL_COMMUNITY): Payer: Medicare Other

## 2011-01-08 LAB — COMPREHENSIVE METABOLIC PANEL
Albumin: 4.1 g/dL (ref 3.5–5.2)
Alkaline Phosphatase: 135 U/L — ABNORMAL HIGH (ref 39–117)
BUN: 16 mg/dL (ref 6–23)
Chloride: 104 mEq/L (ref 96–112)
Creatinine, Ser: 1.37 mg/dL — ABNORMAL HIGH (ref 0.50–1.10)
GFR calc Af Amer: 48 mL/min — ABNORMAL LOW (ref >60)
Glucose, Bld: 93 mg/dL (ref 70–99)
Potassium: 4.1 mEq/L (ref 3.5–5.1)
Total Bilirubin: 0.4 mg/dL (ref 0.3–1.2)

## 2011-01-08 LAB — DIFFERENTIAL
Basophils Absolute: 0 10*3/uL (ref 0.0–0.1)
Eosinophils Relative: 1 % (ref 0–5)
Lymphocytes Relative: 42 % (ref 12–46)
Lymphs Abs: 2.6 10*3/uL (ref 0.7–4.0)
Monocytes Absolute: 0.5 10*3/uL (ref 0.1–1.0)
Neutro Abs: 3 10*3/uL (ref 1.7–7.7)

## 2011-01-08 LAB — CBC
HCT: 34.9 % — ABNORMAL LOW (ref 36.0–46.0)
Hemoglobin: 11.5 g/dL — ABNORMAL LOW (ref 12.0–15.0)
MCHC: 33 g/dL (ref 30.0–36.0)
MCV: 93.3 fL (ref 78.0–100.0)
RDW: 13.8 % (ref 11.5–15.5)
WBC: 6.2 10*3/uL (ref 4.0–10.5)

## 2011-01-08 LAB — SURGICAL PCR SCREEN
MRSA, PCR: NEGATIVE
Staphylococcus aureus: NEGATIVE

## 2011-01-14 ENCOUNTER — Other Ambulatory Visit (INDEPENDENT_AMBULATORY_CARE_PROVIDER_SITE_OTHER): Payer: Self-pay | Admitting: General Surgery

## 2011-01-14 ENCOUNTER — Ambulatory Visit (HOSPITAL_COMMUNITY)
Admission: RE | Admit: 2011-01-14 | Discharge: 2011-01-16 | Disposition: A | Discharge status: Home / Self Care | Payer: Medicare Other | Source: Ambulatory Visit | Attending: General Surgery | Admitting: General Surgery

## 2011-01-14 DIAGNOSIS — R000 Tachycardia, unspecified: Secondary | ICD-10-CM | POA: Insufficient documentation

## 2011-01-14 DIAGNOSIS — F17200 Nicotine dependence, unspecified, uncomplicated: Secondary | ICD-10-CM | POA: Insufficient documentation

## 2011-01-14 DIAGNOSIS — Z433 Encounter for attention to colostomy: Secondary | ICD-10-CM

## 2011-01-14 DIAGNOSIS — G609 Hereditary and idiopathic neuropathy, unspecified: Secondary | ICD-10-CM | POA: Insufficient documentation

## 2011-01-14 DIAGNOSIS — K5090 Crohn's disease, unspecified, without complications: Secondary | ICD-10-CM | POA: Insufficient documentation

## 2011-01-14 DIAGNOSIS — Z01812 Encounter for preprocedural laboratory examination: Secondary | ICD-10-CM | POA: Insufficient documentation

## 2011-01-14 DIAGNOSIS — Z79899 Other long term (current) drug therapy: Secondary | ICD-10-CM | POA: Insufficient documentation

## 2011-01-14 DIAGNOSIS — I10 Essential (primary) hypertension: Secondary | ICD-10-CM | POA: Insufficient documentation

## 2011-01-14 DIAGNOSIS — IMO0002 Reserved for concepts with insufficient information to code with codable children: Secondary | ICD-10-CM | POA: Insufficient documentation

## 2011-01-16 LAB — BASIC METABOLIC PANEL
Calcium: 9.8 mg/dL (ref 8.4–10.5)
GFR calc Af Amer: 51 mL/min — ABNORMAL LOW (ref >60)
GFR calc non Af Amer: 42 mL/min — ABNORMAL LOW (ref >60)
Potassium: 3.9 mEq/L (ref 3.5–5.1)
Sodium: 132 mEq/L — ABNORMAL LOW (ref 135–145)

## 2011-01-16 LAB — CBC
MCH: 30.8 pg (ref 26.0–34.0)
MCHC: 33.6 g/dL (ref 30.0–36.0)
Platelets: 160 10*3/uL (ref 150–400)
RDW: 13.6 % (ref 11.5–15.5)

## 2011-01-21 ENCOUNTER — Encounter (INDEPENDENT_AMBULATORY_CARE_PROVIDER_SITE_OTHER): Payer: Self-pay | Admitting: General Surgery

## 2011-01-22 ENCOUNTER — Ambulatory Visit (INDEPENDENT_AMBULATORY_CARE_PROVIDER_SITE_OTHER): Payer: Medicare Other | Admitting: General Surgery

## 2011-01-22 ENCOUNTER — Encounter (INDEPENDENT_AMBULATORY_CARE_PROVIDER_SITE_OTHER): Payer: Self-pay | Admitting: General Surgery

## 2011-01-22 VITALS — Temp 98.0°F | Wt 146.2 lb

## 2011-01-22 DIAGNOSIS — IMO0002 Reserved for concepts with insufficient information to code with codable children: Secondary | ICD-10-CM

## 2011-01-22 DIAGNOSIS — K9409 Other complications of colostomy: Secondary | ICD-10-CM

## 2011-01-22 NOTE — Patient Instructions (Signed)
May take Ensure. When he gets a small bag split work nicely and the stoma will decrease in size hopefully to approximate to 2inch May increase activity up to approximately 25 pounds left and. He may drive. See me in approximately 3-4 weeks
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
## 2011-01-22 NOTE — Op Note (Signed)
NAMEKRYSTIE, LEITER                 ACCOUNT NO.:  [id number]  MEDICAL RECORD NO.:  [id number]  LOCATION:  DAYL                         FACILITY[facility]  [facility]  PHYSICIAN:  [name] Neha Waight, M.D.DATE OF BIRTH:  [birth year]  DATE OF PROCEDURE:  01/14/2011 DATE OF DISCHARGE:                              OPERATIVE REPORT   PREOPERATIVE DIAGNOSES:  Prolapsing sigmoid colostomy; history of Crohn disease, presently inactive.  OPERATION:  Removal of prolapsing sigmoid colon and revision of her end- colostomy.  ANESTHESIA.:  General anesthesia.  SURGEON:  [name] [name], [name]  ASSISTANT:  Nurse.  HISTORY:  [name] [name] is a 60 year old female with a long history of Crohn disease.  About 13 years ago, she underwent a sigmoid colon colostomy and has done nicely.  She is presently here for colostomy revision and we are planning on seeing if it could be done around the stoma itself without having to do the midline incision.  The patient's abdomen, first I kind of reduced the stoma and then used an #0 silk, to pt pursestring right around the mucosal edge, so I am not removing the excess skin since I used the same stoma site and then after prepping with Betadine surgical scrub and solution and draped in a sterile manner, the time-out was completed.  She had received the Cipro and has the PAS stockings on.  I then basically started opening right at the mucosal skin edge inferiorly using skin hooks on the skin and then carefully worked our way around and then guided to the actual mucosal __________ the peritoneum where it is kind of prolapsing out and then freed up the colon circumferentially.  I elected to remove probably about 12 inches of her sigmoid colon.  The mesentery was divided between [facility] going to this area and the area was freed circumferentially.  I did not do any major laparotomy but was just making sure that I did not get any twist in the colon.  The mesentery was taken  back to this area of sigmoid colon and this is the part that will kind of prolapse out easily, not I could possibly remove a little more if I needed to, then the mesenteric vessels which were clamped were tied with 2-0 silk where I had been using 2-0 Vicryl previously.  The area of the colostomy site was then first clamped with an Zenia Resides, then I put a Kocher distally and then the distal Kocher and resected about a foot of sigmoid colon. Next, I freed up the fascia circumferentially and then put I think 3 or maybe 4 sutures laterally obliquely closing the opening and then put 2 sutures towards medially inferiorly, so now we got a little opening that is about 3 finger size that would be good viable for the stoma but hopefully not large enough that will have a large area of prolapsing. Next, I used 3-0 silk sutures to kind of close the serosa of the peritoneum to the actual colon that was a little thin, narrow sutures, and about 6 were placed to kind of anchor the stoma or the colon at the fascial edges.  After this was done, I then cut under the  Kocher and then released [employer] clamp and we got about 2-3 cm of quite viable sigmoid colon and then I had kind of closed the skin or actually had shrunk the stoma site placing sutures of 2-0 Vicryl, kind of old sutures to kind of bring the stoma down or the skin edges down to a small size. This worked well, I think 5 sutures or 6 were placed.  I then released the Kelly clamp and then sutured in the sigmoid colon walls to the skin edges with interrupted sutures of 2-0 Vicryl instead of usual 3-0 Vicryl that I used just to anchor a little more firmly.  The procedure was tolerated nicely.  I removed the Foley catheter which had been placed preoperatively in the recovery room and we will keep her n.p.o. today but start on liquid diet this evening if she is not nauseated and then advance her diet tomorrow and then hopefully if she is doing well, be able  to discharge on Friday.  Sponge and needle counts were correct.  Estimated blood loss was minimal.  She hopefully will be ready to go home in a couple days.  Since this was kind of a clean contaminated case, I am going to keep her on Cipro for at least 24 hours.     [name] [name], [name]     WJW/MEDQ  D:  01/14/2011  T:  01/14/2011  Job:  [id number]  cc:   Zemirah Krasinski A. Andria Frames, [name]  Electronically Signed by [name] on 01/22/2011 08:31:17 AM
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
## 2011-01-22 NOTE — Progress Notes (Signed)
Subjective:     Patient ID: [name] [name], female   DOB: 10/23/1950, 60 y.o.   MRN: [id number]  HPI    Review of Systems     Objective:   Physical Exam The patient had a recent sigmoid resection for prolapse and sigmoid end colostomy from her previous surgeries for Crohn's proctitis but did the incision through a peristomal incision and tightened up the skin and discharge her on Cipro and Flagyl for approximately 3 days everything appears to be healing without problems with infection. She continues all of her chronic medications for Crohn's and is not requiring pain medication at this time    Assessment:        Plan:     Plan on increasing her activity as she does have a supply of smaller pins in the colostomy bags come in and the female and she's not requiring pain medication and is no evidence of infection or wound problems at this time we'll see me in approximately 3-4 weeks for routine followup. Her path report on the proximal mid foot of sigmoid colon excised showed evidence of chronic inflammation but no evidence of any Crohn's colitis

## 2011-02-11 ENCOUNTER — Other Ambulatory Visit: Payer: Self-pay | Admitting: Family Medicine

## 2011-02-11 DIAGNOSIS — Z1231 Encounter for screening mammogram for malignant neoplasm of breast: Secondary | ICD-10-CM

## 2011-02-12 ENCOUNTER — Encounter (INDEPENDENT_AMBULATORY_CARE_PROVIDER_SITE_OTHER): Payer: Self-pay | Admitting: General Surgery

## 2011-02-12 ENCOUNTER — Ambulatory Visit (INDEPENDENT_AMBULATORY_CARE_PROVIDER_SITE_OTHER): Payer: Medicare Other | Admitting: General Surgery

## 2011-02-12 VITALS — BP 130/76 | HR 72 | Temp 96.8°F

## 2011-02-12 DIAGNOSIS — IMO0002 Reserved for concepts with insufficient information to code with codable children: Secondary | ICD-10-CM

## 2011-02-12 DIAGNOSIS — K5010 Crohn's disease of large intestine without complications: Secondary | ICD-10-CM

## 2011-02-12 DIAGNOSIS — K9409 Other complications of colostomy: Secondary | ICD-10-CM

## 2011-02-12 NOTE — Patient Instructions (Signed)
No restrictions in your diet with certain foods will call us a looser bowel movement see [place] for next visit in 3 months

## 2011-02-12 NOTE — Progress Notes (Signed)
Subjective:     Patient ID: [name], female   DOB: 01-28-1951, 60 y.o.   MRN: [id number]  HPIPatient returns now approximate 6 weeks following Irevision of a prolapse and sigmoid colon. The patient is now approximate 14 years status the Hartmann's and sigmoid colostomy for horrible perineal fistulas with multiple perianal abscesses for active Crohn's disease her perineal area is healed nicely but her colostomy is permanent and she was developed and a prolapse and colon approximately 8 inches of sigmoid colon I resected this and repaired the herniation at [facility] through a peri-colostomy incision she presently doing nicely with no prolapse and no bleeding and no restriction in her diet she states that the stone was a little looser than it used to be   Review of Systems     Objective:   Physical ExamPatient's colostomy looks fine no prolapse and no peri-and infection of the stoma    Assessment:    Satisfactory postoperative prolapsing colostomy: Revision     Plan:     Followup in 3 months no dietary restrictions

## 2011-02-17 ENCOUNTER — Ambulatory Visit: Payer: Medicare Other | Admitting: Family Medicine

## 2011-02-25 ENCOUNTER — Ambulatory Visit (INDEPENDENT_AMBULATORY_CARE_PROVIDER_SITE_OTHER): Payer: Medicare Other | Admitting: Family Medicine

## 2011-02-25 ENCOUNTER — Encounter: Payer: Self-pay | Admitting: Family Medicine

## 2011-02-25 VITALS — BP 131/84 | HR 84 | Ht 66.0 in | Wt 144.0 lb

## 2011-02-25 DIAGNOSIS — G609 Hereditary and idiopathic neuropathy, unspecified: Secondary | ICD-10-CM

## 2011-02-25 DIAGNOSIS — I10 Essential (primary) hypertension: Secondary | ICD-10-CM

## 2011-02-25 DIAGNOSIS — N182 Chronic kidney disease, stage 2 (mild): Secondary | ICD-10-CM

## 2011-02-25 NOTE — Progress Notes (Signed)
  Subjective:    Patient ID: [name] [name], female    DOB: Feb 14, 1951, 60 y.o.   MRN: [id number]  HPI rash on hands responding to OTC hydrocortisone Had revision of colostomy and doing great. Very happy with results. Wants to decrease dose of gabapentin.  Concerned that it is making feet more numb.    Review of Systems     Objective:   Physical Exam Hands, rash typical of dishydrotic eczema.   VS noted, BP and wt good. Feet, skin intact, no callous.  Good pulses.        Assessment & Plan:

## 2011-02-25 NOTE — Patient Instructions (Signed)
Stop taking gabapentin for one week.  If you feel better, stay off the gabapentin.  If you have no change, stay off the medicine.  If you feel worse off the medicine, you can play around with the dose and take it once, twice or three times per day, whichever dose makes you feel best.

## 2011-02-26 DIAGNOSIS — N182 Chronic kidney disease, stage 2 (mild): Secondary | ICD-10-CM | POA: Insufficient documentation

## 2011-02-26 LAB — BASIC METABOLIC PANEL WITH GFR
GFR, Est African American: 48 mL/min — ABNORMAL LOW (ref >60)
GFR, Est Non African American: 39 mL/min — ABNORMAL LOW (ref >60)
Glucose, Bld: 89 mg/dL (ref 70–99)
Potassium: 4.9 mEq/L (ref 3.5–5.3)
Sodium: 140 mEq/L (ref 135–145)

## 2011-02-26 NOTE — Assessment & Plan Note (Signed)
Will vary the dose of gabapentin and see where best therapeutic effect is.

## 2011-02-26 NOTE — Assessment & Plan Note (Signed)
Well controled.

## 2011-02-26 NOTE — Assessment & Plan Note (Signed)
Added to problem list today because it looks like her baseline creat is 1.3

## 2011-03-04 ENCOUNTER — Emergency Department (HOSPITAL_COMMUNITY): Payer: Medicare Other

## 2011-03-04 ENCOUNTER — Emergency Department (HOSPITAL_COMMUNITY)
Admission: EM | Admit: 2011-03-04 | Discharge: 2011-03-04 | Disposition: A | Discharge status: Home / Self Care | Payer: Medicare Other | Attending: Emergency Medicine | Admitting: Emergency Medicine

## 2011-03-04 DIAGNOSIS — E785 Hyperlipidemia, unspecified: Secondary | ICD-10-CM | POA: Insufficient documentation

## 2011-03-04 DIAGNOSIS — I10 Essential (primary) hypertension: Secondary | ICD-10-CM | POA: Insufficient documentation

## 2011-03-04 DIAGNOSIS — F29 Unspecified psychosis not due to a substance or known physiological condition: Secondary | ICD-10-CM | POA: Insufficient documentation

## 2011-03-04 DIAGNOSIS — K5090 Crohn's disease, unspecified, without complications: Secondary | ICD-10-CM | POA: Insufficient documentation

## 2011-03-04 DIAGNOSIS — K219 Gastro-esophageal reflux disease without esophagitis: Secondary | ICD-10-CM | POA: Insufficient documentation

## 2011-03-04 DIAGNOSIS — R569 Unspecified convulsions: Secondary | ICD-10-CM | POA: Insufficient documentation

## 2011-03-04 DIAGNOSIS — Z79899 Other long term (current) drug therapy: Secondary | ICD-10-CM | POA: Insufficient documentation

## 2011-03-04 LAB — COMPREHENSIVE METABOLIC PANEL
ALT: 41 U/L — ABNORMAL HIGH (ref 0–35)
Alkaline Phosphatase: 168 U/L — ABNORMAL HIGH (ref 39–117)
BUN: 18 mg/dL (ref 6–23)
Chloride: 105 mEq/L (ref 96–112)
GFR calc Af Amer: 45 mL/min — ABNORMAL LOW (ref >60)
Glucose, Bld: 93 mg/dL (ref 70–99)
Potassium: 3.8 mEq/L (ref 3.5–5.1)
Sodium: 138 mEq/L (ref 135–145)
Total Bilirubin: 0.4 mg/dL (ref 0.3–1.2)

## 2011-03-04 LAB — DIFFERENTIAL
Basophils Absolute: 0 10*3/uL (ref 0.0–0.1)
Basophils Relative: 0 % (ref 0–1)
Lymphocytes Relative: 23 % (ref 12–46)
Monocytes Absolute: 0.8 10*3/uL (ref 0.1–1.0)
Neutro Abs: 6.7 10*3/uL (ref 1.7–7.7)
Neutrophils Relative %: 68 % (ref 43–77)

## 2011-03-04 LAB — CBC
HCT: 33.6 % — ABNORMAL LOW (ref 36.0–46.0)
Hemoglobin: 11.7 g/dL — ABNORMAL LOW (ref 12.0–15.0)
RBC: 3.71 MIL/uL — ABNORMAL LOW (ref 3.87–5.11)
WBC: 10 10*3/uL (ref 4.0–10.5)

## 2011-03-13 ENCOUNTER — Encounter: Payer: Self-pay | Admitting: Family Medicine

## 2011-03-13 ENCOUNTER — Ambulatory Visit (INDEPENDENT_AMBULATORY_CARE_PROVIDER_SITE_OTHER): Payer: Medicare Other | Admitting: Family Medicine

## 2011-03-13 VITALS — BP 123/82 | HR 85 | Ht 66.0 in | Wt 142.0 lb

## 2011-03-13 DIAGNOSIS — R569 Unspecified convulsions: Secondary | ICD-10-CM

## 2011-03-13 DIAGNOSIS — Z87898 Personal history of other specified conditions: Secondary | ICD-10-CM | POA: Insufficient documentation

## 2011-03-13 NOTE — Progress Notes (Signed)
  Subjective:    Patient ID: [name] [name] [place], female    DOB: 11-05-1950, 60 y.o.   MRN: [id number]  HPI Patient seen in ER with a first ever "seizure."  No warning symptoms.  Brief in duration.  Mild post spell confusion.  No clear reason for seizure.  ER checked blood work which was normal.  Had eaten shortly before event.  No reason she should have been vagal at the time.   No hx of head trauma, CVA or other brain injury.  The only possible factor was that she had stopped gabapentin (low dose) 2 days before seizure.      Review of Systems     Objective:   Physical Exam Normal HEENT, heart and neuro exam       Assessment & Plan:

## 2011-03-16 NOTE — Assessment & Plan Note (Signed)
Will not medicate at this time.  Will check EEG

## 2011-04-20 ENCOUNTER — Ambulatory Visit
Admission: RE | Admit: 2011-04-20 | Discharge: 2011-04-20 | Disposition: A | Discharge status: Home / Self Care | Payer: Medicare Other | Source: Ambulatory Visit | Attending: Family Medicine | Admitting: Family Medicine

## 2011-04-20 DIAGNOSIS — Z1231 Encounter for screening mammogram for malignant neoplasm of breast: Secondary | ICD-10-CM

## 2011-05-07 DIAGNOSIS — J45909 Unspecified asthma, uncomplicated: Secondary | ICD-10-CM

## 2011-05-07 HISTORY — DX: Unspecified asthma, uncomplicated: J45.909

## 2011-05-08 ENCOUNTER — Emergency Department (HOSPITAL_COMMUNITY): Payer: Medicare Other

## 2011-05-08 ENCOUNTER — Encounter: Payer: Self-pay | Admitting: Family Medicine

## 2011-05-08 ENCOUNTER — Inpatient Hospital Stay (HOSPITAL_COMMUNITY)
Admission: EM | Admit: 2011-05-08 | Discharge: 2011-05-11 | DRG: 291 | Disposition: A | Discharge status: Home / Self Care | Payer: Medicare Other | Attending: Family Medicine | Admitting: Family Medicine

## 2011-05-08 DIAGNOSIS — K219 Gastro-esophageal reflux disease without esophagitis: Secondary | ICD-10-CM | POA: Diagnosis present

## 2011-05-08 DIAGNOSIS — F17200 Nicotine dependence, unspecified, uncomplicated: Secondary | ICD-10-CM | POA: Diagnosis present

## 2011-05-08 DIAGNOSIS — I509 Heart failure, unspecified: Secondary | ICD-10-CM | POA: Diagnosis present

## 2011-05-08 DIAGNOSIS — K5090 Crohn's disease, unspecified, without complications: Secondary | ICD-10-CM | POA: Diagnosis present

## 2011-05-08 DIAGNOSIS — I5021 Acute systolic (congestive) heart failure: Principal | ICD-10-CM | POA: Diagnosis present

## 2011-05-08 DIAGNOSIS — J154 Pneumonia due to other streptococci: Secondary | ICD-10-CM | POA: Diagnosis present

## 2011-05-08 DIAGNOSIS — I10 Essential (primary) hypertension: Secondary | ICD-10-CM | POA: Diagnosis present

## 2011-05-08 DIAGNOSIS — Z932 Ileostomy status: Secondary | ICD-10-CM

## 2011-05-08 DIAGNOSIS — E785 Hyperlipidemia, unspecified: Secondary | ICD-10-CM | POA: Diagnosis present

## 2011-05-08 DIAGNOSIS — I5022 Chronic systolic (congestive) heart failure: Secondary | ICD-10-CM | POA: Diagnosis not present

## 2011-05-08 LAB — BASIC METABOLIC PANEL
BUN: 21 mg/dL (ref 6–23)
Chloride: 107 mEq/L (ref 96–112)
Creatinine, Ser: 1.47 mg/dL — ABNORMAL HIGH (ref 0.50–1.10)
GFR calc Af Amer: 45 mL/min — ABNORMAL LOW (ref >90)
GFR calc non Af Amer: 38 mL/min — ABNORMAL LOW (ref >90)
Potassium: 3.5 mEq/L (ref 3.5–5.1)

## 2011-05-08 LAB — POCT I-STAT 3, ART BLOOD GAS (G3+)
Acid-base deficit: 8 mmol/L — ABNORMAL HIGH (ref 0.0–2.0)
O2 Saturation: 95 %
pO2, Arterial: 75 mmHg — ABNORMAL LOW (ref 80.0–100.0)

## 2011-05-08 LAB — D-DIMER, QUANTITATIVE: D-Dimer, Quant: 3.67 ug/mL-FEU — ABNORMAL HIGH (ref 0.00–0.48)

## 2011-05-08 LAB — CBC
HCT: 34.7 % — ABNORMAL LOW (ref 36.0–46.0)
MCHC: 32.6 g/dL (ref 30.0–36.0)
MCHC: 34.2 g/dL (ref 30.0–36.0)
MCV: 94.3 fL (ref 78.0–100.0)
Platelets: 198 10*3/uL (ref 150–400)
Platelets: 158 10*3/uL (ref 150–400)
RDW: 13.9 % (ref 11.5–15.5)
RDW: 14 % (ref 11.5–15.5)
WBC: 9.3 10*3/uL (ref 4.0–10.5)
WBC: 8.8 10*3/uL (ref 4.0–10.5)

## 2011-05-08 MED ORDER — IOHEXOL 300 MG/ML  SOLN
100.0000 mL | Freq: Once | INTRAMUSCULAR | Status: AC | PRN
  Administered 2011-05-08: 100 mL via INTRAVENOUS

## 2011-05-08 NOTE — H&P (Signed)
[name] [name] is an 60 y.o. female.   Chief Complaint: difficulty breathing HPI: This is a 60 YO AA F who is a current smoker and who has Crohn's disease on immunosuppressants presenting with acute dyspnea starting last night. Around 12:00 am last night, woke up from sleep due to suddenly having difficulty breathing. Her boyfriend told her that she looked like she was gasping for air. She also reports an episode where she tripped over a lamp in the bathroom. When asked whether she lost consciousness, she reports that she may have for a moment. Did not fall but found herself leaning forwards with her hands on table in the bathroom. She reports feeling unwell for the past several days, like she has a "head cold" and has been taking Tylenol.  She denies chest pain, nausea, vomiting. She feels her stools are lighter colored than usual but denies diarrhea or change in consistency. She has a history of Crohn's disease and has an ileostomy. Denies blood in stool.   Past Medical History  Diagnosis Date  . Crohn disease   . Hypertension   . Hyperlipidemia     Past Surgical History  Procedure Date  . Cholecystectomy, laparoscopic 01/28/05  . 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 Salpingectomy     left    No family history on file. Social History:  reports that she has been smoking Cigarettes.  She has a 15 pack-year smoking history. She has never used smokeless tobacco. She reports that she does not drink alcohol or use illicit drugs. Smokes 1/7 ppd.  Allergies:  Allergies  Allergen Reactions  . Amoxicillin Anaphylaxis and Rash    REACTION:Throat Swelling  . Penicillins Anaphylaxis and Rash  . Gabapentin     Patient had one time seizure shortly after stopping gabapentin  . Aspirin Itching and Rash    REACTION: Rash only.  NO breathing issues with use.   Able to tolerate ASACOL    Medications Prior to Admission  Medication Dose Route Frequency Provider Last Rate Last Dose  . iohexol (OMNIPAQUE) 300  MG/ML injection 100 mL  100 mL Intravenous Once PRN Medication Radiologist   100 mL at 05/08/11 1428   Medications Prior to Admission  Medication Sig Dispense Refill  . calcium-vitamin D (OSCAL WITH D 500-200) 500-200 MG-UNIT per tablet Take 1 tablet by mouth 2 (two) times daily.       . hydrochlorothiazide (,MICROZIDE/HYDRODIURIL,) 12.5 MG capsule Take 1 capsule (12.5 mg total) by mouth every morning.  90 capsule  3  . HYDROcodone-acetaminophen (NORCO) 5-325 MG per tablet Take 1 tablet by mouth as needed.        Marland Kitchen LORazepam (ATIVAN) 0.5 MG tablet Take 1 tablet (0.5 mg total) by mouth 2 (two) times daily as needed for anxiety.  30 tablet  3  . mesalamine (ASACOL) 400 MG EC tablet Take 400 mg by mouth as directed. 3 tabs twice daily      . omeprazole (PRILOSEC) 20 MG capsule Take 20 mg by mouth daily.        . simvastatin (ZOCOR) 20 MG tablet Take 1 tablet (20 mg total) by mouth at bedtime.  30 tablet  10    Results for orders placed during the hospital encounter of 05/08/11 (from the past 48 hour(s))  CBC     Status: Abnormal   Collection Time   05/08/11  8:00 AM      Component Value Range Comment   WBC 9.3  4.0 - 10.5 (  K/uL)    RBC 3.68 (*) 3.87 - 5.11 (MIL/uL)    Hemoglobin 11.3 (*) 12.0 - 15.0 (g/dL)    HCT 34.7 (*) 36.0 - 46.0 (%)    MCV 94.3  78.0 - 100.0 (fL)    MCH 30.7  26.0 - 34.0 (pg)    MCHC 32.6  30.0 - 36.0 (g/dL)    RDW 13.9  11.5 - 15.5 (%)    Platelets 198  150 - 400 (K/uL)   BASIC METABOLIC PANEL     Status: Abnormal   Collection Time   05/08/11  8:00 AM      Component Value Range Comment   Sodium 139  135 - 145 (mEq/L)    Potassium 3.5  3.5 - 5.1 (mEq/L)    Chloride 107  96 - 112 (mEq/L)    CO2 15 (*) 19 - 32 (mEq/L)    Glucose, Bld 175 (*) 70 - 99 (mg/dL)    BUN 21  6 - 23 (mg/dL)    Creatinine, Ser 1.47 (*) 0.50 - 1.10 (mg/dL)    Calcium 9.6  8.4 - 10.5 (mg/dL)    GFR calc non Af Amer 38 (*) >90 (mL/min)    GFR calc Af Amer 45 (*) >90 (mL/min)   D-DIMER,  QUANTITATIVE     Status: Abnormal   Collection Time   05/08/11  8:00 AM      Component Value Range Comment   D-Dimer, Quant 3.67 (*) 0.00 - 0.48 (ug/mL-FEU)   POCT I-STAT 3, BLOOD GAS (G3+)     Status: Abnormal   Collection Time   05/08/11 12:14 PM      Component Value Range Comment   pH, Arterial 7.383  7.350 - 7.400     pCO2 arterial 26.6 (*) 35.0 - 45.0 (mmHg)    pO2, Arterial 75.0 (*) 80.0 - 100.0 (mmHg)    Bicarbonate 15.8 (*) 20.0 - 24.0 (mEq/L)    TCO2 17  0 - 100 (mmol/L)    O2 Saturation 95.0      Acid-base deficit 8.0 (*) 0.0 - 2.0 (mmol/L)    Collection site RADIAL, ALLEN'S TEST ACCEPTABLE      Drawn by Operator      Sample type ARTERIAL     CBC     Status: Abnormal   Collection Time   05/08/11  3:00 PM      Component Value Range Comment   WBC 8.8  4.0 - 10.5 (K/uL)    RBC 3.25 (*) 3.87 - 5.11 (MIL/uL)    Hemoglobin 10.2 (*) 12.0 - 15.0 (g/dL)    HCT 29.8 (*) 36.0 - 46.0 (%)    MCV 91.7  78.0 - 100.0 (fL)    MCH 31.4  26.0 - 34.0 (pg)    MCHC 34.2  30.0 - 36.0 (g/dL)    RDW 14.0  11.5 - 15.5 (%)    Platelets 158  150 - 400 (K/uL)    Ct Angio Chest W/cm &/or Wo Cm  05/08/2011  *RADIOLOGY REPORT*  Clinical Data:  Shortness of breath, cough, hemoptysis, evaluate for PE  CT ANGIOGRAPHY CHEST WITH CONTRAST  Technique:  Multidetector CT imaging of the chest was performed using the standard protocol during bolus administration of intravenous contrast.  Multiplanar CT image reconstructions including MIPs were obtained to evaluate the vascular anatomy.  Contrast: 171m OMNIPAQUE IOHEXOL 300 MG/ML IV SOLN  Comparison:  Chest radiographs dated 05/08/2011  Findings:  No evidence of pulmonary embolism.  Moderate paraseptal and centrilobular emphysematous  changes. Superimposed interstitial and patchy dependent opacities in the bilateral upper lobes and right lower lobe.  This appearance is most suggestive of infection, although asymmetric edema with atelectasis could have a similar  appearance. No pleural effusion or pneumothorax.  The visualized thyroid is unremarkable.  The heart is normal in size.  No pericardial effusion.  Coronary atherosclerosis.  Atherosclerotic calcifications of the aortic arch.  1.4 cm short-axis right hilar lymph node (series 4/image 62).  No suspicious mediastinal or axillary lymphadenopathy.  Visualized upper abdomen is unremarkable.  Mild degenerative changes of the visualized thoracolumbar spine.  Review of the MIP images confirms the above findings.  IMPRESSION: No evidence of pulmonary embolism.  Multifocal opacities in the bilateral upper lobes and right lower lobe, most suggestive of infection, possibly on the basis of aspiration.  Asymmetric edema with atelectasis could have a similar appearance but is considered less likely.  Moderate paraseptal and centrilobular emphysematous changes.  Original Report Authenticated By: Julian Hy, [name]   Dg Pneumonia Chest Port1v  05/08/2011  *RADIOLOGY REPORT*  Clinical Data: Shortness of breath, cough, hemoptysis  PORTABLE CHEST - 1 VIEW  Comparison: Chest x-ray of 03/04/2011  Findings: There is poorly defined airspace disease throughout the mid and upper lung fields bilaterally.  Pneumonia would be the primary consideration with edema a possibility as well. Mediastinal contours appear normal.  The heart is within normal limits in size.  No bony abnormality is seen.  IMPRESSION: Vague airspace disease in the mid and upper lung fields bilaterally.  Probable pneumonia.  Difficult to exclude edema.  Original Report Authenticated By: Joretta Bachelor, [name]    Review of Systems  Constitutional: Positive for chills and malaise/fatigue. Negative for diaphoresis.  HENT: Positive for congestion. Negative for ear pain, nosebleeds and sore throat.   Eyes: Negative for blurred vision.  Respiratory: Positive for cough, hemoptysis, sputum production, shortness of breath and wheezing.   Cardiovascular: Negative for chest  pain, palpitations and leg swelling.  Gastrointestinal: Negative for heartburn, nausea, vomiting, abdominal pain, diarrhea, constipation and blood in stool.  Genitourinary: Negative for dysuria, urgency, frequency, hematuria and flank pain.  Musculoskeletal: Positive for myalgias and back pain (Pain under right shoulder for the past few weeks. Unchanged. Diagnosed with muscular problem by PCP.).  Neurological: Negative for dizziness and headaches.    T 98.1, HR 105 (was 142 on admission), RR 20 (was 33 previously), 111/75 Physical Exam  Constitutional: She is oriented to person, place, and time. She appears distressed (Appears uncomfortable with shaking.).  HENT:  Head: Normocephalic and atraumatic.  Right Ear: External ear normal.  Left Ear: External ear normal.  Nose: Nose normal.  Mouth/Throat: Oropharynx is clear and moist. No oropharyngeal exudate.  Eyes: Conjunctivae are normal. Right eye exhibits no discharge. Left eye exhibits no discharge.  Neck: Normal range of motion. Neck supple. No JVD present.  Cardiovascular: Regular rhythm and intact distal pulses.  Tachycardia present.  Exam reveals distant heart sounds (Difficult to hear heart sounds due to noisy breath sounds ).   No murmur heard. Respiratory: Accessory muscle usage present. No stridor. Tachypnea noted. She is in respiratory distress. She has no decreased breath sounds. She has no wheezes. She has rhonchi in the right upper field, the right middle field, the right lower field, the left upper field, the left middle field and the left lower field. She has rales in the right lower field and the left lower field.    GI: Soft. Bowel sounds are normal. She  exhibits no distension and no mass. There is no tenderness. There is no rebound and no guarding.       Ileostomy site clean/dry/intact  Musculoskeletal: She exhibits no edema and no tenderness.  Lymphadenopathy:    She has no cervical adenopathy.  Neurological: She is alert  and oriented to person, place, and time.  Skin: No rash noted. She is not diaphoretic. No erythema.       3-4 sec capillary refill  Psychiatric: Her speech is normal and behavior is normal. Judgment and thought content normal. Her mood appears anxious. Cognition and memory are normal.    EKG: sinus tachycardia HR 143 Normal axis Normal intervals Non-specific ST-changes Previous ECG 12/2010 showed NSR (HR 69).  Assessment/Plan This is a 60 YO AA F who is a current smoker and who has Crohn's disease on immunosuppressants complaining of acute dyspnea and hemoptysis.  1. Dyspnea, hemoptysis. But with normal WBC and decent O2 saturations on 2L currently. Likely due to pneumonia. Location of infiltrates concerning for aspiration pneumonia, although cause of aspiration is unclear. Patient has intact neurological function and denies alcohol and does not have a history of abuse. CT-angiogram did not show evidence of PE. Will treat as community-acquired pneumonia for now with CTX and azithromycin. Patient did get a dose of clindamycin in the ED to cover for possible aspiration pneumonia.  2. Tachypnea, tachycardia. Likely due to pneumonia and dehydration, and I think anxiety is playing a part as well. See #1 regarding pneumonia. Continue home Ativan as needed. Will monitor on telemetry. Will fluid bolus.  3. Crohn's disease. Continue home mesalamine.  4. Syncopal episode? Patient without neurological deficits. Most likely cause seems to be episode of orthostasis from dehydration. Will give 500 mL fluid bolus now. Patient able to tolerate PO.  5. FEN/GI. See #4. Regular diet. 6. PPx. Heparin SQ tid.  7. Disposition.    OH PARK, Jhoel Stieg 05/08/2011, 4:46 PM

## 2011-05-09 DIAGNOSIS — I5023 Acute on chronic systolic (congestive) heart failure: Secondary | ICD-10-CM

## 2011-05-09 DIAGNOSIS — J159 Unspecified bacterial pneumonia: Secondary | ICD-10-CM

## 2011-05-09 LAB — BASIC METABOLIC PANEL
CO2: 16 mEq/L — ABNORMAL LOW (ref 19–32)
Calcium: 9.2 mg/dL (ref 8.4–10.5)
Chloride: 109 mEq/L (ref 96–112)
Glucose, Bld: 101 mg/dL — ABNORMAL HIGH (ref 70–99)
Potassium: 3.6 mEq/L (ref 3.5–5.1)
Sodium: 139 mEq/L (ref 135–145)

## 2011-05-09 LAB — CBC
Hemoglobin: 10.3 g/dL — ABNORMAL LOW (ref 12.0–15.0)
Platelets: 155 10*3/uL (ref 150–400)
RBC: 3.29 MIL/uL — ABNORMAL LOW (ref 3.87–5.11)
WBC: 7.7 10*3/uL (ref 4.0–10.5)

## 2011-05-09 MED ORDER — ACETAMINOPHEN 325 MG PO TABS: 650.0000 mg | ORAL_TABLET | Freq: Three times a day (TID) | ORAL | Status: DC | PRN

## 2011-05-09 MED ORDER — MESALAMINE 400 MG PO TBEC
1200.0000 mg | DELAYED_RELEASE_TABLET | Freq: Two times a day (BID) | ORAL | Status: DC
  Administered 2011-05-10 – 2011-05-11 (×3): 1200 mg via ORAL
  Filled 2011-05-09 (×6): qty 3

## 2011-05-09 MED ORDER — IPRATROPIUM BROMIDE 0.02 % IN SOLN
0.5000 mg | Freq: Four times a day (QID) | RESPIRATORY_TRACT | Status: DC
  Administered 2011-05-10 – 2011-05-11 (×6): 0.5 mg via RESPIRATORY_TRACT
  Filled 2011-05-09 (×4): qty 2.5

## 2011-05-09 MED ORDER — CHOLECALCIFEROL 10 MCG (400 UNIT) PO TABS
400.0000 [IU] | ORAL_TABLET | Freq: Every day | ORAL | Status: DC
  Administered 2011-05-10 – 2011-05-11 (×2): 400 [IU] via ORAL
  Filled 2011-05-09 (×3): qty 1

## 2011-05-09 MED ORDER — AZITHROMYCIN 500 MG IV SOLR
500.0000 mg | INTRAVENOUS | Status: DC
  Administered 2011-05-10: 500 mg via INTRAVENOUS
  Filled 2011-05-09: qty 500

## 2011-05-09 MED ORDER — HEPARIN SODIUM (PORCINE) 5000 UNIT/ML IJ SOLN
5000.0000 [IU] | Freq: Three times a day (TID) | INTRAMUSCULAR | Status: DC
  Administered 2011-05-10 – 2011-05-11 (×5): 5000 [IU] via SUBCUTANEOUS
  Filled 2011-05-09 (×10): qty 1

## 2011-05-09 MED ORDER — IPRATROPIUM BROMIDE 0.02 % IN SOLN: 0.5000 mg | RESPIRATORY_TRACT | Status: DC | PRN

## 2011-05-09 MED ORDER — PANTOPRAZOLE SODIUM 40 MG PO TBEC
40.0000 mg | DELAYED_RELEASE_TABLET | Freq: Every day | ORAL | Status: DC
  Administered 2011-05-10 – 2011-05-11 (×2): 40 mg via ORAL

## 2011-05-09 MED ORDER — DEXTROSE 5 % IV SOLN
1.0000 g | INTRAVENOUS | Status: DC
  Administered 2011-05-10 – 2011-05-11 (×2): 1 g via INTRAVENOUS
  Filled 2011-05-09 (×3): qty 10

## 2011-05-09 MED ORDER — ALBUTEROL SULFATE (5 MG/ML) 0.5% IN NEBU
2.5000 mg | INHALATION_SOLUTION | RESPIRATORY_TRACT | Status: DC | PRN
  Filled 2011-05-09: qty 0.5

## 2011-05-09 MED ORDER — LORAZEPAM 0.5 MG PO TABS: 0.5000 mg | ORAL_TABLET | Freq: Two times a day (BID) | ORAL | Status: DC | PRN

## 2011-05-09 MED ORDER — THERA M PLUS PO TABS
1.0000 | ORAL_TABLET | Freq: Every day | ORAL | Status: DC
  Administered 2011-05-10 – 2011-05-11 (×2): 1 via ORAL
  Filled 2011-05-09 (×3): qty 1

## 2011-05-09 MED ORDER — SIMVASTATIN 20 MG PO TABS
20.0000 mg | ORAL_TABLET | Freq: Every day | ORAL | Status: DC
  Administered 2011-05-10: 20 mg via ORAL
  Filled 2011-05-09 (×3): qty 1

## 2011-05-10 DIAGNOSIS — I5022 Chronic systolic (congestive) heart failure: Secondary | ICD-10-CM | POA: Diagnosis not present

## 2011-05-10 DIAGNOSIS — I059 Rheumatic mitral valve disease, unspecified: Secondary | ICD-10-CM

## 2011-05-10 LAB — EXPECTORATED SPUTUM ASSESSMENT W GRAM STAIN, RFLX TO RESP C

## 2011-05-10 LAB — LEGIONELLA ANTIGEN, URINE

## 2011-05-10 MED ORDER — LISINOPRIL 2.5 MG PO TABS
2.5000 mg | ORAL_TABLET | Freq: Every day | ORAL | Status: DC
  Administered 2011-05-10 – 2011-05-11 (×2): 2.5 mg via ORAL
  Filled 2011-05-10 (×3): qty 1

## 2011-05-10 NOTE — Progress Notes (Signed)
  Echocardiogram 2D Echocardiogram has been performed.  [name] [name] 05/10/2011, 11:31 AM

## 2011-05-10 NOTE — Progress Notes (Signed)
Physical Therapy Evaluation Patient Details Name: Ipek Westra MRN: [id number] DOB: Jan 12, 1951 Today's Date: 05/10/2011  Problem List:  Patient Active Problem List  Diagnoses  . HYPERCHOLESTEROLEMIA  . TOBACCO DEPENDENCE  . Anxiety  . NEUROPATHY, PERIPHERAL  . REFLUX ESOPHAGITIS  . CROHN'S DISEASE  . Hypertension, benign  . Chronic kidney disease (CKD), stage II (mild)  . Seizure    Past Medical History:  Past Medical History  Diagnosis Date  . Crohn disease   . Hypertension   . Hyperlipidemia    Past Surgical History:  Past Surgical History  Procedure Date  . Cholecystectomy, laparoscopic 01/28/05  . 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 Salpingectomy     left    PT Assessment/Plan/Recommendation PT Assessment Clinical Impression Statement: Spoke with MD after completion of eval.  Advised that pt not appropriate for d/c home today, and would benefit from additional day in the hospital.  Recommended nursing ambulate patient to help with deconditioning.  RW left in room. PT Recommendation/Assessment: Patient will need skilled PT in the acute care venue PT Problem List: Decreased activity tolerance;Decreased balance;Decreased mobility;Decreased strength Barriers to Discharge: Decreased caregiver support PT Therapy Diagnosis : Difficulty walking;Generalized weakness PT Plan PT Frequency: Min 3X/week PT Treatment/Interventions: Gait training;DME instruction;Stair training;Functional mobility training;Balance training;Therapeutic exercise;Patient/family education PT Recommendation Follow Up Recommendations: None Equipment Recommended: Rolling walker with 5" wheels PT Goals  Acute Rehab PT Goals PT Goal Formulation: With patient Time For Goal Achievement: 7 days Pt will Transfer Sit to Stand/Stand to Sit: with modified independence PT Transfer Goal: Sit to Stand/Stand to Sit - Progress: Progressing toward goal Pt will Transfer Bed to Chair/Chair to Bed: with modified  independence PT Transfer Goal: Bed to Chair/Chair to Bed - Progress: Progressing toward goal Pt will Ambulate: >150 feet;with modified independence PT Goal: Ambulate - Progress: Progressing toward goal Pt will Go Up / Down Stairs: 1-2 stairs;with rail(s) PT Goal: Up/Down Stairs - Progress: Other (comment) (not assessed)  PT Evaluation Precautions/Restrictions    Prior Functioning  Home Living Lives With: Significant other Type of Home: House Home Layout: One level Home Access: Stairs to enter Entrance Stairs-Rails: Can reach both;Right;Left Entrance Stairs-Number of Steps: 2 Bathroom Shower/Tub: Tub/shower unit;Walk-in shower Bathroom Toilet: Standard Bathroom Accessibility: Yes How Accessible: Accessible via walker Home Adaptive Equipment: Walker - standard;Shower chair without back Prior Function Level of Independence: Independent with basic ADLs;Independent with homemaking with ambulation;Independent with gait;Independent with transfers Cognition Cognition Arousal/Alertness: Awake/alert Overall Cognitive Status: Appears within functional limits for tasks assessed Sensation/Coordination   Extremity Assessment   Mobility (including Balance) Bed Mobility Bed Mobility: Yes Supine to Sit: 6: Modified independent (Device/Increase time) Sit to Supine - Right: 6: Modified independent (Device/Increase time) Transfers Transfers: Yes Sit to Stand: 4: Min assist Stand to Sit: 5: Supervision Ambulation/Gait Ambulation/Gait: Yes Ambulation/Gait Assistance: 5: Supervision Ambulation/Gait Assistance Details (indicate cue type and reason): verbal cues for safety Ambulation Distance (Feet): 100 Feet Assistive device: Rolling walker  Posture/Postural Control Posture/Postural Control: No significant limitations Balance Balance Assessed: Yes Dynamic Standing Balance Dynamic Standing - Balance Support: Right upper extremity supported Dynamic Standing - Level of Assistance: 5: Stand  by assistance Exercise    End of Session PT - End of Session Equipment Utilized During Treatment: Gait belt Activity Tolerance: Patient limited by fatigue Patient left: in bed;with call bell in reach Nurse Communication: Mobility status for ambulation;Mobility status for transfers General Behavior During Session: [facility] for tasks performed Cognition: [facility] for tasks performed  Lorrin Goodell  Debroah Baller 05/10/2011, 3:38 PM

## 2011-05-10 NOTE — Discharge Planning (Signed)
Pt states she has a standard walker at home that is over 58 years old. Contacted [facility] for RW.

## 2011-05-10 NOTE — Progress Notes (Signed)
Subjective: Ms. Ungar is doing well with IV antibiotics. She currently is on a little bit of oxygen by nasal cannula. She has not tried ambulate very much yet today. She denies any chest pains palpitations or dyspnea at rest. She denies any current subjective fever. Significantly her urine strep antigen came back positive.  Objective: Vital signs in last 24 hours: Temp:  [98.5 F (36.9 C)-98.7 F (37.1 C)] 98.7 F (37.1 C) (11/04 0531) Pulse Rate:  [94] 94  (11/04 0531) Resp:  [18] 18  (11/03 2210) BP: (103-130)/(64-78) 103/64 mmHg (11/04 0531) SpO2:  [98 %-99 %] 98 % (11/04 0823) Weight change:     Intake/Output from previous day:   Intake/Output this shift:    Exam:  BP 103/64  Pulse 94  Temp(Src) 98.7 F (37.1 C) (Oral)  Resp 18  Ht 5' 6"  (1.676 m)  Wt 153 lb 12.8 oz (69.763 kg)  BMI 24.82 kg/m2  SpO2 98% Gen: Well NAD HEENT: EOMI,  MMM Lungs: CTABL Nl WOB Heart: RRR no MRG Abd: NABS, NT, ND Exts: Non edematous BL  LE, warm and well perfused.    Lab Results:  Basename 05/09/11 0630 05/08/11 1500  WBC 7.7 8.8  HGB 10.3* 10.2*  HCT 30.6* 29.8*  PLT 155 158   BMET  Basename 05/09/11 0630 05/08/11 0800  NA 139 139  K 3.6 3.5  CL 109 107  CO2 16* 15*  GLUCOSE 101* 175*  BUN 18 21  CREATININE 1.35* 1.47*  CALCIUM 9.2 9.6    Studies/Results: Ct Angio Chest W/cm &/or Wo Cm  05/08/2011  *RADIOLOGY REPORT*  Clinical Data:  Shortness of breath, cough, hemoptysis, evaluate for PE  CT ANGIOGRAPHY CHEST WITH CONTRAST  Technique:  Multidetector CT imaging of the chest was performed using the standard protocol during bolus administration of intravenous contrast.  Multiplanar CT image reconstructions including MIPs were obtained to evaluate the vascular anatomy.  Contrast: 14m OMNIPAQUE IOHEXOL 300 MG/ML IV SOLN  Comparison:  Chest radiographs dated 05/08/2011  Findings:  No evidence of pulmonary embolism.  Moderate paraseptal and centrilobular emphysematous changes.  Superimposed interstitial and patchy dependent opacities in the bilateral upper lobes and right lower lobe.  This appearance is most suggestive of infection, although asymmetric edema with atelectasis could have a similar appearance. No pleural effusion or pneumothorax.  The visualized thyroid is unremarkable.  The heart is normal in size.  No pericardial effusion.  Coronary atherosclerosis.  Atherosclerotic calcifications of the aortic arch.  1.4 cm short-axis right hilar lymph node (series 4/image 62).  No suspicious mediastinal or axillary lymphadenopathy.  Visualized upper abdomen is unremarkable.  Mild degenerative changes of the visualized thoracolumbar spine.  Review of the MIP images confirms the above findings.  IMPRESSION: No evidence of pulmonary embolism.  Multifocal opacities in the bilateral upper lobes and right lower lobe, most suggestive of infection, possibly on the basis of aspiration.  Asymmetric edema with atelectasis could have a similar appearance but is considered less likely.  Moderate paraseptal and centrilobular emphysematous changes.  Original Report Authenticated By: SJulian Hy [name]    Medications: I have reviewed the patient's current medications.  Assessment/Plan: 60year old woman with community-acquired pneumonia. 1) community-acquired pneumonia: Associated with some dyspnea. Urine strep antigen is positive.  This is her most likely diagnosis for her symptoms. She is currently being treated with azithromycin and ceftriaxone. At this point as we are confident that she has strep pneumonia we'll discontinue azithromycin.  Additionally I plan to ambulate patient off  of oxygen to see how well she does. 2) elevated BNP: Continue to have a question of heart failure as an explanation for her symptoms.  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  However my likelihood of pneumonia to explain this symptom is high now with a urine strep antigen.  If she  does well with ambulation off of oxygen plan to discharge to home and to obtain echo at outpatient. We'll follow 3. Crohn's disease. Continue home mesalamine.  4. FEN/GI.  Regular diet.  5. PPx. Heparin SQ tid.  6. Disposition. Possibly to home today     LOS: 2 days   COREY,EVAN 05/10/2011, 9:35 AM

## 2011-05-11 LAB — BASIC METABOLIC PANEL
Chloride: 105 mEq/L (ref 96–112)
GFR calc Af Amer: 47 mL/min — ABNORMAL LOW (ref >90)
Potassium: 3.8 mEq/L (ref 3.5–5.1)
Sodium: 136 mEq/L (ref 135–145)

## 2011-05-11 LAB — CBC
HCT: 29.2 % — ABNORMAL LOW (ref 36.0–46.0)
Hemoglobin: 9.8 g/dL — ABNORMAL LOW (ref 12.0–15.0)
RBC: 3.14 MIL/uL — ABNORMAL LOW (ref 3.87–5.11)
RDW: 14.1 % (ref 11.5–15.5)
WBC: 7.2 10*3/uL (ref 4.0–10.5)

## 2011-05-11 MED ORDER — LISINOPRIL 2.5 MG PO TABS: 2.5000 mg | ORAL_TABLET | Freq: Every day | ORAL | Status: DC

## 2011-05-11 MED ORDER — CEFDINIR 300 MG PO CAPS: 300.0000 mg | ORAL_CAPSULE | Freq: Two times a day (BID) | ORAL | Status: AC

## 2011-05-11 NOTE — Discharge Summary (Signed)
Discharge Summary 05/11/2011 1:19 PM  [name] [name] DOB: 06-Dec-1950 MRN: [id number]  Date of Admission: 05/08/2011 Date of Discharge: 05/11/2011  PCP: [name] Gottron, MD Consultants: none  Reason for Admission: dyspnea and hemoptysis  Discharge Diagnosis Primary 1. Systolic Heart Failure with EF 30-35% 2. Strep Pneumo pneumonia Secondary 1. Crohn's disease 2. Hypertension 3. Hyperlipidemia  Hospital Course: Ms. [name] [name] is a 60 year old female with history of Crohn's disease s/p ileostomy who presented with acute onset dyspnea after several days of "cold" symptoms.  1. Pneumonia: Given acute onset of dyspnea and an elevated D-dimer in the ED, a CT-angio was obtained.  The CTA showed "No evidence of pulmonary embolism. Multifocal opacities in the bilateral upper lobes and right lower lobe, most suggestive of infection, possibly on the basis of aspiration. Asymmetric edema with atelectasis could have a similar appearance but is considered less likely. Moderate paraseptal and centrilobular emphysematous changes."  She was started on ceftriaxone and azithromycin.  A urine strep pneumo antigen was positive and her antibiotics were narrowed to ceftriaxone.  She was discharge with omnicef to complete a 10 day course of antibiotics.  2. Systolic Heart Failure: New diagnosis this admission.  With history of orthopnea, exertional dyspnea, viral myocarditis in 1990s, and CTA with pattern that could be consistent with pulmonary edema, an echocardigram was obtained.  The echo showed an EF of 30-35%.  She was started on lisinopril 2.3m daily.  Would consider increasing lisinopril and adding a beta-blocker as tolerated by her pulse and blood pressure.  3. Hypertension: Well controlled during this hospitalization on home medications.   4. Hyperlipidemia: Stable on home medications.  5. Crohn's Disease: Ileostomy present.  Stable on home medications.  Procedures: none  Discharge  Medications Medication List  As of 05/11/2011  1:44 PM   START taking these medications         cefdinir 300 MG capsule   Commonly known as: OMNICEF   Take 1 capsule (300 mg total) by mouth 2 (two) times daily.      lisinopril 2.5 MG tablet   Commonly known as: PRINIVIL,ZESTRIL   Take 1 tablet (2.5 mg total) by mouth daily.         CONTINUE taking these medications         acetaminophen 500 MG tablet   Commonly known as: TYLENOL      calcium-vitamin D 500-200 MG-UNIT per tablet   Commonly known as: OSCAL WITH D      hydrochlorothiazide 12.5 MG capsule   Commonly known as: MICROZIDE   Take 1 capsule (12.5 mg total) by mouth every morning.      HYDROcodone-acetaminophen 5-325 MG per tablet   Commonly known as: NORCO      LORazepam 0.5 MG tablet   Commonly known as: ATIVAN   Take 1 tablet (0.5 mg total) by mouth 2 (two) times daily as needed for anxiety.      mesalamine 400 MG EC tablet   Commonly known as: ASACOL      multivitamins ther. w/minerals Tabs      omeprazole 20 MG capsule   Commonly known as: PRILOSEC      simvastatin 20 MG tablet   Commonly known as: ZOCOR   Take 1 tablet (20 mg total) by mouth at bedtime.      VITAMIN D-3 PO          Where to get your medications    These are the prescriptions that you need to pick up. We  sent them to a specific pharmacy, so you will need to go there to get them.   [facility], [place] [address]    Phone: [phone]        cefdinir 300 MG capsule   lisinopril 2.5 MG tablet           Pertinent Hospital Labs Echo:  - Left ventricle: The cavity size was at the upper limits of normal. Wall thickness was normal. Systolic function was moderately to severely reduced. The estimated ejection fraction was in the range of 30% to 35%. There is akinesis of the lateral and inferior myocardium. There is hypokinesis of the  anterior myocardium. Doppler parameters are consistent with abnormal left ventricular relaxation (grade 1 diastolic dysfunction). Doppler parameters are consistent with high ventricular filling pressure. - Aortic valve: Trivial regurgitation. - Mitral valve: Mobility of the posterior leaflet was restricted. Mild regurgitation directed posteriorly. - Left atrium: The atrium was mildly dilated. - Pericardium, extracardiac: A trivial pericardial effusion was identified.   Discharge instructions: Patient was provided information on CHF.  Instructed to notify PCP if develops worsening dyspnea, peripheral edema, requiring additional pillows at night.  She was told to come to the ER if experiencing chest pain.  Condition at discharge: stable  Disposition: home  Pending Tests: respiratory culture  Follow up:  Follow-up Information    Follow up with [name] [name] on 05/15/2011. (at 1:30pm)    Contact information:   [place] [phone]         Follow up Issues: Please address any continued dyspnea on exertion, orthopnea, weight gain/edema.  Would also consider getting a Bmet to assess kidney function and increasing lisinopril if BP will tolerate, or starting a beta-blocker.

## 2011-05-11 NOTE — Discharge Summary (Signed)
Agree with DC today as outlined by Dr. Darrick Grinder.

## 2011-05-11 NOTE — Progress Notes (Signed)
Patient discharge teaching given, including activity, diet, follow-up appoints, and medications. Patient verbalized understanding of all discharge instructions. IV access was d/c'd. Vitals are stable. Skin is intact except as charted in most recent assessments. Pt to be escorted out by NT, to be driven home by her significant other.

## 2011-05-11 NOTE — Progress Notes (Signed)
Physical Therapy Treatment Patient Details Name: [name] [name] MRN: [id number] DOB: 09-Dec-1950 Today's Date: 05/11/2011  PT Assessment/Plan  PT - Assessment/Plan Comments on Treatment Session: Pt has met all acute PT goals PT Plan: All goals met and education completed, patient dischaged from PT services PT Frequency: Min 3X/week Follow Up Recommendations: None Equipment Recommended: None recommended by PT (pt already has rolling walker) PT Goals  Acute Rehab PT Goals PT Goal Formulation: With patient Pt will Transfer Sit to Stand/Stand to Sit: with modified independence PT Transfer Goal: Sit to Stand/Stand to Sit - Progress: Met Pt will Transfer Bed to Chair/Chair to Bed: with modified independence PT Transfer Goal: Bed to Chair/Chair to Bed - Progress: Met Pt will Ambulate: >150 feet;with modified independence PT Goal: Ambulate - Progress: Met Pt will Go Up / Down Stairs: 1-2 stairs;with rail(s) PT Goal: Up/Down Stairs - Progress: Met  PT Treatment Precautions/Restrictions  Restrictions Weight Bearing Restrictions: No Mobility (including Balance) Bed Mobility Bed Mobility: Yes Supine to Sit: 7: Independent;HOB flat Sit to Supine - Right: 7: Independent;HOB flat Transfers Transfers: Yes Sit to Stand: 7: Independent;From bed;With upper extremity assist Stand to Sit: 7: Independent;To bed;With upper extremity assist Ambulation/Gait Ambulation/Gait: Yes Ambulation/Gait Assistance: 6: Modified independent (Device/Increase time);7: Independent Ambulation/Gait Assistance Details (indicate cue type and reason): Mod I with rolling walker, I with no AD Ambulation Distance (Feet): 200 Feet Assistive device: Rolling walker;None Gait Pattern: Within Functional Limits Gait velocity: WFL Stairs: Yes Stairs Assistance: 7: Independent Stair Management Technique: One rail Left Number of Stairs: 3     Exercise    End of Session PT - End of Session Equipment Utilized During  Treatment: Gait belt Activity Tolerance: Patient tolerated treatment well Patient left: in bed Nurse Communication: Mobility status for transfers;Mobility status for ambulation General Behavior During Session: [facility] for tasks performed Cognition: [facility] for tasks performed  Akeiba Axelson 05/11/2011, 12:54 PM

## 2011-05-11 NOTE — Progress Notes (Signed)
Seen and examined.  She is feeling well today and ready for DC.  Did have question about new diagnosis of systolic dysfunction CHF - likely a long term problem with her.  I will see late this week in office.  Agree with planned discharge.

## 2011-05-11 NOTE — Progress Notes (Signed)
Initial UR review complete.

## 2011-05-11 NOTE — Progress Notes (Addendum)
  FMTS Daily Intern Progress Note  Subjective: Feeling well this morning.  Asking to go home.  No acute concerns; no CP, dyspnea, edema, dizziness.  Objective Temp:  [98.7 F (37.1 C)-99.2 F (37.3 C)] 99.2 F (37.3 C) (11/05 0532) Pulse Rate:  [63-105] 63  (11/05 0532) Resp:  [18] 18  (11/05 0532) BP: (102-136)/(71-81) 119/81 mmHg (11/05 0532) SpO2:  [94 %-99 %] 98 % (11/05 0532)   Intake/Output Summary (Last 24 hours) at 05/11/11 0851 Last data filed at 05/11/11 0200  Gross per 24 hour  Intake   1260 ml  Output    300 ml  Net    960 ml   General: awake, alert, NAD CV: RRR, no murmurs Pulm: CTAB; no wheezes, rales, rhonchi Ext: no edema, 2+ pulses  Labs and Imaging Lab Results  Component Value Date   WBC 7.2 05/11/2011   HGB 9.8* 05/11/2011   HCT 29.2* 05/11/2011   MCV 93.0 05/11/2011   PLT 160 05/11/2011   BMET    Component Value Date/Time   NA 136 05/11/2011 0020   K 3.8 05/11/2011 0020   CL 105 05/11/2011 0020   CO2 18* 05/11/2011 0020   GLUCOSE 96 05/11/2011 0020   BUN 21 05/11/2011 0020   CREATININE 1.41* 05/11/2011 0020   CREATININE 1.38* 02/25/2011 1224   CALCIUM 9.4 05/11/2011 0020   GFRNONAA 40* 05/11/2011 0020   GFRAA 47* 05/11/2011 0020   Strep Pneumo Ag: POSITIVE  Echo: EF of 79-89%, grade I diastolic  Assessment and Plan 60 year old female with Crohn's disease, HTN, HLD who presented with dyspnea and hemoptysis found to have pneumonia and systolic CHF.  1. Systolic Heart Failure: Given pattern of infiltrates, there was concern for cardiogenic pulmonary edema.  Also has history of viral myocarditis in the 1990s; has not had prior evaluation of heart function after infection.  Echo showed moderate to severe systolic dysfunction with an EF of 30-35%.  This is a new diagnosis of CHF.  She was started on lisinopril 2.26m. -will titrate lisinopril and consider adding beta-blocker as an outpatient as BP tolerates  2. Pneumonia: Presented with dyspnea and  hemoptysis.  A strep pneumo urine antigen was positive.  She has received 2 doses of ceftriaxone. -will transition to PO antibiotics, Cefdinir 3065mPO BID x8 days  3. HTN: Well controlled on home medications.  Tolerating low dose lisinopril.  4. Crohn's disease: stable on home medications.  Ostomy present.  5. HLD: stable on home medications.  FEN: heart healthy diet; saline lock IV PPx: heparin 5000 units SQ TID Dispo: this afternoon after PT; walker ordered for home.  BOOTH, ERIN 05/11/2011, 8:51 AM

## 2011-05-12 LAB — CULTURE, RESPIRATORY W GRAM STAIN

## 2011-05-15 ENCOUNTER — Ambulatory Visit (INDEPENDENT_AMBULATORY_CARE_PROVIDER_SITE_OTHER): Payer: Medicare Other | Admitting: Family Medicine

## 2011-05-15 ENCOUNTER — Encounter: Payer: Self-pay | Admitting: Family Medicine

## 2011-05-15 DIAGNOSIS — I519 Heart disease, unspecified: Secondary | ICD-10-CM

## 2011-05-15 DIAGNOSIS — I509 Heart failure, unspecified: Secondary | ICD-10-CM

## 2011-05-15 DIAGNOSIS — I5022 Chronic systolic (congestive) heart failure: Secondary | ICD-10-CM | POA: Insufficient documentation

## 2011-05-15 DIAGNOSIS — I5020 Unspecified systolic (congestive) heart failure: Secondary | ICD-10-CM

## 2011-05-15 LAB — CULTURE, BLOOD (ROUTINE X 2)
Culture  Setup Time: 201211030117
Culture: NO GROWTH

## 2011-05-15 LAB — BASIC METABOLIC PANEL
BUN: 20 mg/dL (ref 6–23)
CO2: 13 mEq/L — ABNORMAL LOW (ref 19–32)
Chloride: 110 mEq/L (ref 96–112)
Potassium: 4.5 mEq/L (ref 3.5–5.3)

## 2011-05-15 MED ORDER — ISOSORBIDE MONONITRATE ER 30 MG PO TB24: 30.0000 mg | ORAL_TABLET | Freq: Every day | ORAL | Status: DC

## 2011-05-15 NOTE — Patient Instructions (Signed)
I want you to see a cardiologist for a stress test.  I am worried that circulation problems may be causing your weak heart muscle. I did some blood work today.  I will call you Monday with the results.  I am starting one new medicine for you to take before bed to hopefully prevent this chest tightness at night.

## 2011-05-15 NOTE — Assessment & Plan Note (Signed)
Concerned Re: CAD as etiology.

## 2011-05-15 NOTE — Progress Notes (Signed)
  Subjective:    Patient ID: [name] [name], female    DOB: 1951-02-15, 61 y.o.   MRN: [id number]  HPI Hospitalized with SOB.  Thought to have perhaps pneumonia but does have echocardiogram proven systolic dysfunction CHF.  Begun on ACE and here for follow up.  Feels good.  Mild fatigue on exertion.  Occasional left sided - more posterior than anterior discomfort.  No good etiology for CHF.  Echo did show segmental wall abnormalities.   Quit smoking    Review of Systems     Objective:   Physical Exam Lungs clear Cardiac RRR without m Abd benign. Ext no edema.       Assessment & Plan:

## 2011-05-26 ENCOUNTER — Ambulatory Visit (INDEPENDENT_AMBULATORY_CARE_PROVIDER_SITE_OTHER): Payer: Medicare Other | Admitting: Family Medicine

## 2011-05-26 ENCOUNTER — Encounter: Payer: Self-pay | Admitting: Family Medicine

## 2011-05-26 VITALS — BP 129/73 | HR 85 | Temp 98.1°F | Ht 66.0 in | Wt 150.9 lb

## 2011-05-26 DIAGNOSIS — I509 Heart failure, unspecified: Secondary | ICD-10-CM

## 2011-05-26 DIAGNOSIS — I519 Heart disease, unspecified: Secondary | ICD-10-CM

## 2011-05-26 DIAGNOSIS — I5020 Unspecified systolic (congestive) heart failure: Secondary | ICD-10-CM

## 2011-05-26 MED ORDER — FUROSEMIDE 20 MG PO TABS: 20.0000 mg | ORAL_TABLET | Freq: Two times a day (BID) | ORAL | Status: DC

## 2011-05-26 NOTE — Patient Instructions (Signed)
The fluid pill is called Lasix, the generic is Furosemide. Take this once a day until you can see Dr. Andria Frames again. Weigh yourself everyday and write down your weights.  Bring those in when you see Dr. Andria Frames. If you start gaining lots of weight, have trouble using the bathroom, develop bad chest pain, or trouble breathing call us or go to the ED.

## 2011-05-26 NOTE — Assessment & Plan Note (Signed)
Trial of Furosemide daily. She is to weight herself daily as well.  See instructions About 10 lb weight gain over past several months, down 3 lbs since leaving hospital.    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

## 2011-05-26 NOTE — Progress Notes (Signed)
  Subjective:    Patient ID: [name] [name], female    DOB: [birth year], 60 y.o.   MRN: [id number]  HPI 1.  Orthopnea:  Patient with increasing difficulty sleeping at night secondary to orthopnea.  States she has had this since leaving hospital but feels it is becoming slightly worse.  Tries to not take Ativan but often takes one of these at 1 or 2 AM if she hasn't been able to sleep.  Also has increasing cough of white frothy sputum.  No fevers or chills.  No LE edema.  No chest pain, dyspnea on exertion, or palpitations.  Taking all meds as prescribed.    Review of Systems See HPI above for review of systems.       Objective:   Physical Exam Gen:  Alert, cooperative patient who appears stated age in no acute distress.  Vital signs reviewed. HEENT:  [place]/AT.  EOMI, PERRL.  MMM, tonsils non-erythematous, non-edematous.  External ears WNL, Bilateral TM's normal without retraction, redness or bulging.  Neck:  No JVD Cardiac:  Regular rate and rhythm without murmur auscultated.  Good S1/S2. Lungs:  Mild crackles bibasilar.   Abd: nontender Extr:  No edema       Assessment & Plan:

## 2011-06-05 ENCOUNTER — Other Ambulatory Visit: Payer: Self-pay | Admitting: Family Medicine

## 2011-06-05 ENCOUNTER — Ambulatory Visit (INDEPENDENT_AMBULATORY_CARE_PROVIDER_SITE_OTHER): Payer: Medicare Other | Admitting: Cardiovascular Disease

## 2011-06-05 ENCOUNTER — Encounter: Payer: Self-pay | Admitting: Cardiovascular Disease

## 2011-06-05 VITALS — BP 108/70 | HR 64 | Ht 66.0 in | Wt 150.8 lb

## 2011-06-05 DIAGNOSIS — I10 Essential (primary) hypertension: Secondary | ICD-10-CM

## 2011-06-05 DIAGNOSIS — I5020 Unspecified systolic (congestive) heart failure: Secondary | ICD-10-CM

## 2011-06-05 DIAGNOSIS — I509 Heart failure, unspecified: Secondary | ICD-10-CM

## 2011-06-05 DIAGNOSIS — I519 Heart disease, unspecified: Secondary | ICD-10-CM

## 2011-06-05 NOTE — Patient Instructions (Signed)
Your physician recommends that you schedule a follow-up appointment in: 3 months  Your physician recommends that you return for lab work in: 3 months/ BMP ordered

## 2011-06-05 NOTE — Progress Notes (Signed)
[name] Date of Birth  1950-08-19 [facility] [address]    [place], [place]  [address] [phone]  Fax  [phone]  History of Present Illness:  This is a 60 year old female with a recent diagnosis of congestive heart failure. She was recently admitted to the hospital with shortness of breath. She was found to have a pneumonia. She was also found to have a severely reduced left ventricular systolic function with an ejection fraction of 35%.  In May she had a stress Myoview study that revealed normal left ventricle systolic function and no evidence of ischemia.  She's feeling quite a bit better on medical therapy. Or shortness breath is better. She still has some dyspnea.   She's been taking all her medications. She still eats some salt on regular basis  She also complains of having some anxiety.  Current Outpatient Prescriptions on File Prior to Visit  Medication Sig Dispense Refill  . acetaminophen (TYLENOL) 500 MG tablet Take 1,000 mg by mouth at bedtime as needed. For pain       . calcium-vitamin D (OSCAL WITH D 500-200) 500-200 MG-UNIT per tablet Take 1 tablet by mouth 2 (two) times daily.       . furosemide (LASIX) 20 MG tablet Take 1 tablet (20 mg total) by mouth 2 (two) times daily.  30 tablet  1  . hydrochlorothiazide (,MICROZIDE/HYDRODIURIL,) 12.5 MG capsule Take 1 capsule (12.5 mg total) by mouth every morning.  90 capsule  3  . isosorbide mononitrate (IMDUR) 30 MG 24 hr tablet Take 1 tablet (30 mg total) by mouth at bedtime.  30 tablet  11  . lisinopril (PRINIVIL,ZESTRIL) 2.5 MG tablet Take 1 tablet (2.5 mg total) by mouth daily.  30 tablet  0  . LORazepam (ATIVAN) 0.5 MG tablet Take 1 tablet (0.5 mg total) by mouth 2 (two) times daily as needed for anxiety.  30 tablet  3  . mesalamine (ASACOL) 400 MG EC tablet Take 1,200 mg by mouth 2 (two) times daily. 3 tabs twice daily      . omeprazole (PRILOSEC) 20 MG capsule Take 20 mg by mouth daily.        . simvastatin (ZOCOR) 20 MG tablet Take 1 tablet (20 mg total) by mouth at bedtime.  30 tablet  10    Allergies  Allergen Reactions  . Amoxicillin Anaphylaxis and Rash    REACTION:Throat Swelling  . Penicillins Anaphylaxis and Rash  . Gabapentin     Patient had one time seizure shortly after stopping gabapentin  . Aspirin Itching and Rash    REACTION: Rash only.  NO breathing issues with use.   Able to tolerate ASACOL    Past Medical History  Diagnosis Date  . Crohn disease   . Hypertension   . Hyperlipidemia   . CHF (congestive heart failure)     Past Surgical History  Procedure Date  . Cholecystectomy, laparoscopic 01/28/05  . 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 Salpingectomy     left    History  Smoking status  . Former Smoker -- 0.5 packs/day for 30 years  . Types: Cigarettes  . Quit date: 05/10/2011  Smokeless tobacco  . Never Used    History  Alcohol Use No    No family history on file.  Reviw of Systems:  Reviewed in the HPI.  All other systems are negative.  Physical Exam: BP 108/70  Pulse 64  Ht 5' 6"  (1.676 m)  Wt 150 lb 12.8 oz (68.402 kg)  BMI 24.34 kg/m2 The patient is alert and oriented x 3.  The mood and affect are normal.   Skin: warm and dry.  Color is normal.    HEENT:   Normocephalic/atraumatic. She has normal carotids. There is no JVD.  Lungs: Lung exam is clear.   Heart: Regular rate S1-S2.    Abdomen: She has colostomy on her left side.  +BS. nontender  Extremities:  She has no clubbing cyanosis or edema.  Neuro:  There exam is nonfocal.    ECG: Normal sinus rhythm. She has no ST or T wave changes.  Assessment / Plan:

## 2011-06-05 NOTE — Telephone Encounter (Signed)
Refill request

## 2011-06-05 NOTE — Assessment & Plan Note (Signed)
Majesty has history of congestive heart failure with an ejection fraction of 30-35%. She has been started on low-dose Lasix as well as lisinopril. She's also isosorbide. Her heart rate is fairly slow so do not think that she'll tolerate an beta blocker at this point.  A stress Myoview study performed in May reveals no evidence of ischemia. Surprisingly, her ejection fraction was higher at that point.  We'll continue with medical therapy. She's not having any episodes of angina. I do not think that she needs any further evaluation. We will reassess her by echocardiography in the next year.

## 2011-06-05 NOTE — Assessment & Plan Note (Signed)
She presents for followup of her congestive heart failure. An echocardiogram revealed an ejection fraction of around 35%. She had a stress Myoview study in May which revealed normal left ventricular systolic function.  She's been on lisinopril. She's feeling a little bit better.  At this point I do not think that we need to do any further medication adjustment. Her heart rate is 62. Her blood pressure is on the low end of normal. We'll continue with the current dose of medicines. I have asked her to decrease her salt intake. I'll see her again in 3 months. We'll have her exercise on regular basis.

## 2011-06-05 NOTE — Telephone Encounter (Signed)
OK to refill

## 2011-06-10 ENCOUNTER — Encounter: Payer: Self-pay | Admitting: Family Medicine

## 2011-06-10 ENCOUNTER — Ambulatory Visit (INDEPENDENT_AMBULATORY_CARE_PROVIDER_SITE_OTHER): Payer: Medicare Other | Admitting: Family Medicine

## 2011-06-10 DIAGNOSIS — I509 Heart failure, unspecified: Secondary | ICD-10-CM

## 2011-06-10 DIAGNOSIS — F411 Generalized anxiety disorder: Secondary | ICD-10-CM

## 2011-06-10 DIAGNOSIS — I519 Heart disease, unspecified: Secondary | ICD-10-CM

## 2011-06-10 DIAGNOSIS — F419 Anxiety disorder, unspecified: Secondary | ICD-10-CM

## 2011-06-10 DIAGNOSIS — I5020 Unspecified systolic (congestive) heart failure: Secondary | ICD-10-CM

## 2011-06-10 MED ORDER — LORAZEPAM 0.5 MG PO TABS: 0.5000 mg | ORAL_TABLET | Freq: Two times a day (BID) | ORAL | Status: DC

## 2011-06-10 MED ORDER — LISINOPRIL 2.5 MG PO TABS: 2.5000 mg | ORAL_TABLET | Freq: Every day | ORAL | Status: DC

## 2011-06-10 MED ORDER — ISOSORBIDE MONONITRATE ER 30 MG PO TB24: 30.0000 mg | ORAL_TABLET | Freq: Every day | ORAL | Status: DC

## 2011-06-10 MED ORDER — FUROSEMIDE 20 MG PO TABS: 20.0000 mg | ORAL_TABLET | Freq: Every day | ORAL | Status: DC

## 2011-06-10 NOTE — Assessment & Plan Note (Signed)
Refilled meds

## 2011-06-10 NOTE — Patient Instructions (Signed)
You are doing well on your current medicines.  Stay on them. Next time you see me, I need to do a PAP smear. OK to get a colonoscopy by Dr. Amedeo Plenty.  Show him this note.  Dr. Amedeo Plenty, [name] [name] was hospitalized with a new diagnosis of CHF.  She is stable on medicines and OK to have a colonoscopy if you think it is needed.  She is not on any blood thinners and has no heart valve problems (no SBE prophylaxis.)

## 2011-06-11 NOTE — Progress Notes (Signed)
  Subjective:    Patient ID: [name] [name], female    DOB: Sep 04, 1950, 60 y.o.   MRN: [id number]  HPI  Doing well with new diagnosis of CHF/FU hospitalization.  Has been seen by cards - I reviewed their note.  Denies chest pain or SOB.  Denies ankle swelling.  She is active and denies DOE.  Tolerating meds without difficulty.    Review of Systems     Objective:   Physical Exam Lungs clear Cardiac RRR without m or G Abd benign Ext no edema        Assessment & Plan:

## 2011-06-22 ENCOUNTER — Other Ambulatory Visit: Payer: Self-pay | Admitting: Family Medicine

## 2011-06-22 NOTE — Telephone Encounter (Signed)
Refill request

## 2011-06-24 ENCOUNTER — Encounter: Payer: Self-pay | Admitting: Family Medicine

## 2011-06-24 NOTE — Progress Notes (Signed)
  Subjective:    Patient ID: [name] [name], female    DOB: [birth year], 60 y.o.   MRN: [id number]  HPI encounter created in [place]    Review of Systems     Objective:   Physical Exam        Assessment & Plan:

## 2011-07-11 ENCOUNTER — Observation Stay (HOSPITAL_COMMUNITY)
Admission: EM | Admit: 2011-07-11 | Discharge: 2011-07-12 | Disposition: A | Discharge status: Home / Self Care | Payer: Medicare Other | Attending: Emergency Medicine | Admitting: Emergency Medicine

## 2011-07-11 ENCOUNTER — Encounter (HOSPITAL_COMMUNITY): Payer: Self-pay | Admitting: Emergency Medicine

## 2011-07-11 ENCOUNTER — Emergency Department (HOSPITAL_COMMUNITY): Payer: Medicare Other

## 2011-07-11 ENCOUNTER — Other Ambulatory Visit: Payer: Self-pay

## 2011-07-11 DIAGNOSIS — F411 Generalized anxiety disorder: Secondary | ICD-10-CM | POA: Diagnosis present

## 2011-07-11 DIAGNOSIS — R0789 Other chest pain: Secondary | ICD-10-CM

## 2011-07-11 DIAGNOSIS — K2100 Gastro-esophageal reflux disease with esophagitis, without bleeding: Secondary | ICD-10-CM | POA: Diagnosis present

## 2011-07-11 DIAGNOSIS — I10 Essential (primary) hypertension: Secondary | ICD-10-CM | POA: Diagnosis present

## 2011-07-11 DIAGNOSIS — E7800 Pure hypercholesterolemia, unspecified: Secondary | ICD-10-CM | POA: Diagnosis present

## 2011-07-11 DIAGNOSIS — E785 Hyperlipidemia, unspecified: Secondary | ICD-10-CM

## 2011-07-11 DIAGNOSIS — K5090 Crohn's disease, unspecified, without complications: Secondary | ICD-10-CM | POA: Insufficient documentation

## 2011-07-11 DIAGNOSIS — F419 Anxiety disorder, unspecified: Secondary | ICD-10-CM | POA: Diagnosis present

## 2011-07-11 DIAGNOSIS — K219 Gastro-esophageal reflux disease without esophagitis: Secondary | ICD-10-CM | POA: Insufficient documentation

## 2011-07-11 DIAGNOSIS — I509 Heart failure, unspecified: Secondary | ICD-10-CM | POA: Insufficient documentation

## 2011-07-11 DIAGNOSIS — I5040 Unspecified combined systolic (congestive) and diastolic (congestive) heart failure: Secondary | ICD-10-CM | POA: Insufficient documentation

## 2011-07-11 DIAGNOSIS — A059 Bacterial foodborne intoxication, unspecified: Secondary | ICD-10-CM

## 2011-07-11 DIAGNOSIS — I5020 Unspecified systolic (congestive) heart failure: Secondary | ICD-10-CM

## 2011-07-11 DIAGNOSIS — I5022 Chronic systolic (congestive) heart failure: Secondary | ICD-10-CM | POA: Diagnosis present

## 2011-07-11 DIAGNOSIS — R079 Chest pain, unspecified: Principal | ICD-10-CM | POA: Diagnosis not present

## 2011-07-11 LAB — DIFFERENTIAL
Eosinophils Absolute: 0.2 10*3/uL (ref 0.0–0.7)
Eosinophils Relative: 2 % (ref 0–5)
Lymphs Abs: 2.7 10*3/uL (ref 0.7–4.0)
Monocytes Relative: 9 % (ref 3–12)

## 2011-07-11 LAB — CBC
MCH: 30.8 pg (ref 26.0–34.0)
MCV: 92.7 fL (ref 78.0–100.0)
Platelets: 139 10*3/uL — ABNORMAL LOW (ref 150–400)
RBC: 3.54 MIL/uL — ABNORMAL LOW (ref 3.87–5.11)

## 2011-07-11 LAB — CK TOTAL AND CKMB (NOT AT ARMC): Total CK: 284 U/L — ABNORMAL HIGH (ref 7–177)

## 2011-07-11 LAB — URINALYSIS, ROUTINE W REFLEX MICROSCOPIC
Bilirubin Urine: NEGATIVE
Ketones, ur: NEGATIVE mg/dL
Nitrite: NEGATIVE
Urobilinogen, UA: 0.2 mg/dL (ref 0.0–1.0)

## 2011-07-11 LAB — COMPREHENSIVE METABOLIC PANEL
Alkaline Phosphatase: 126 U/L — ABNORMAL HIGH (ref 39–117)
BUN: 23 mg/dL (ref 6–23)
Chloride: 107 mEq/L (ref 96–112)
GFR calc Af Amer: 35 mL/min — ABNORMAL LOW (ref >90)
Glucose, Bld: 98 mg/dL (ref 70–99)
Potassium: 3.8 mEq/L (ref 3.5–5.1)
Total Bilirubin: 0.4 mg/dL (ref 0.3–1.2)

## 2011-07-11 LAB — TROPONIN I: Troponin I: <0.3 ng/mL (ref <0.30)

## 2011-07-11 MED ORDER — MESALAMINE 400 MG PO TBEC
1200.0000 mg | DELAYED_RELEASE_TABLET | Freq: Two times a day (BID) | ORAL | Status: DC
  Administered 2011-07-12 (×2): 1200 mg via ORAL
  Filled 2011-07-11 (×3): qty 3

## 2011-07-11 MED ORDER — HEPARIN SODIUM (PORCINE) 5000 UNIT/ML IJ SOLN
5000.0000 [IU] | Freq: Three times a day (TID) | INTRAMUSCULAR | Status: DC
  Administered 2011-07-12 (×3): 5000 [IU] via SUBCUTANEOUS
  Filled 2011-07-11 (×5): qty 1

## 2011-07-11 MED ORDER — HYDROCHLOROTHIAZIDE 12.5 MG PO CAPS
12.5000 mg | ORAL_CAPSULE | ORAL | Status: DC
  Administered 2011-07-12: 12.5 mg via ORAL
  Filled 2011-07-11 (×2): qty 1

## 2011-07-11 MED ORDER — ONDANSETRON HCL 4 MG PO TABS: 4.0000 mg | ORAL_TABLET | Freq: Four times a day (QID) | ORAL | Status: DC | PRN

## 2011-07-11 MED ORDER — SENNA 8.6 MG PO TABS
1.0000 | ORAL_TABLET | Freq: Every evening | ORAL | Status: DC | PRN
  Filled 2011-07-11: qty 1

## 2011-07-11 MED ORDER — ISOSORBIDE MONONITRATE ER 30 MG PO TB24
30.0000 mg | ORAL_TABLET | Freq: Every day | ORAL | Status: DC
  Administered 2011-07-12: 30 mg via ORAL
  Filled 2011-07-11 (×2): qty 1

## 2011-07-11 MED ORDER — CITALOPRAM HYDROBROMIDE 10 MG PO TABS
10.0000 mg | ORAL_TABLET | Freq: Every day | ORAL | Status: DC
  Administered 2011-07-11 – 2011-07-12 (×2): 10 mg via ORAL
  Filled 2011-07-11 (×2): qty 1

## 2011-07-11 MED ORDER — LISINOPRIL 2.5 MG PO TABS
2.5000 mg | ORAL_TABLET | Freq: Every day | ORAL | Status: DC
  Administered 2011-07-11 – 2011-07-12 (×2): 2.5 mg via ORAL
  Filled 2011-07-11 (×2): qty 1

## 2011-07-11 MED ORDER — ACETAMINOPHEN 325 MG PO TABS: 650.0000 mg | ORAL_TABLET | Freq: Every evening | ORAL | Status: DC | PRN

## 2011-07-11 MED ORDER — FUROSEMIDE 20 MG PO TABS
20.0000 mg | ORAL_TABLET | Freq: Every day | ORAL | Status: DC
  Administered 2011-07-11 – 2011-07-12 (×2): 20 mg via ORAL
  Filled 2011-07-11 (×2): qty 1

## 2011-07-11 MED ORDER — CALCIUM CARBONATE-VITAMIN D 500-200 MG-UNIT PO TABS
1.0000 | ORAL_TABLET | Freq: Two times a day (BID) | ORAL | Status: DC
  Administered 2011-07-12 (×2): 1 via ORAL
  Filled 2011-07-11 (×3): qty 1

## 2011-07-11 MED ORDER — PANTOPRAZOLE SODIUM 40 MG PO TBEC
40.0000 mg | DELAYED_RELEASE_TABLET | Freq: Every day | ORAL | Status: DC
  Administered 2011-07-11 – 2011-07-12 (×2): 40 mg via ORAL
  Filled 2011-07-11 (×2): qty 1

## 2011-07-11 MED ORDER — SODIUM CHLORIDE 0.9 % IJ SOLN: 3.0000 mL | INTRAMUSCULAR | Status: DC | PRN

## 2011-07-11 MED ORDER — ALUM & MAG HYDROXIDE-SIMETH 200-200-20 MG/5ML PO SUSP: 30.0000 mL | Freq: Four times a day (QID) | ORAL | Status: DC | PRN

## 2011-07-11 MED ORDER — LORAZEPAM 0.5 MG PO TABS
0.5000 mg | ORAL_TABLET | Freq: Two times a day (BID) | ORAL | Status: DC
  Administered 2011-07-12 (×2): 0.5 mg via ORAL
  Filled 2011-07-11 (×2): qty 1

## 2011-07-11 MED ORDER — SODIUM CHLORIDE 0.9 % IV SOLN: 250.0000 mL | INTRAVENOUS | Status: DC | PRN

## 2011-07-11 MED ORDER — ONDANSETRON HCL 4 MG/2ML IJ SOLN: 4.0000 mg | Freq: Four times a day (QID) | INTRAMUSCULAR | Status: DC | PRN

## 2011-07-11 MED ORDER — SIMVASTATIN 20 MG PO TABS
20.0000 mg | ORAL_TABLET | Freq: Every day | ORAL | Status: DC
  Administered 2011-07-12: 20 mg via ORAL
  Filled 2011-07-11 (×2): qty 1

## 2011-07-11 MED ORDER — SODIUM CHLORIDE 0.9 % IJ SOLN
3.0000 mL | Freq: Two times a day (BID) | INTRAMUSCULAR | Status: DC
  Administered 2011-07-12 (×2): 3 mL via INTRAVENOUS

## 2011-07-11 NOTE — ED Notes (Signed)
Awaiting MD assessment.

## 2011-07-11 NOTE — H&P (Signed)
[facility] Attending Admit Note  Patient seen and examined in the ED with resident Dr. Jess Barters, I agree with his assessment and management plan as documented in the H&P.  Patient appears in no apparent distress at the time of our interview/exam.  Focused exam reveals no thyromegaly or nodularity; no cervical adenopathy. She has some mild bilateral hand tremor at rest and intention on finger/nose testing.  No clonus or hyperreflexia.  I agree with plan to rule out possible medical etiologies of her apparent anxiety.  Importantly, she denies feelings of depression, and a bedside verbal PHQ2 is negative.  She adamantly denies suicidality or feelings of wanting to harm herself in any way.  She denies auditory of visual hallucinations, and similarly denies any alcohol or illicit drug use.  Assuming negative serial cardiac enzymes, would agree with likely discharge from hospital.  To consider initiation of long-acting BNZ such as clonazepam on scheduled basis, as the patient is currently taking Ativan on a regular basis and her anxiety is not well controlled.   Dalbert Mayotte, MD

## 2011-07-11 NOTE — ED Notes (Signed)
Resting quietly in bed. Continues to deny pain. Resp even and unlabored. NAD noted. Awaiting admission.

## 2011-07-11 NOTE — H&P (Signed)
[name] [name] is an 61 y.o. female.   Chief Complaint: Chest tightness HPI: The patient is a 61 year old female with multiple medical problems and chronic problems with anxiety who presents today with chest tightness. The patient reports that she has been having significantly more problems with anxiety over the last number of weeks, most notably since her most recent echo in her hospitalization at the end of November. She reports that her symptoms typically involve a "chest fullness" along with racing thoughts. Last night she began to experience some of these symptoms, and took one of her Ativan pills. She reports that this initially helped her symptoms become somewhat that her, and she does talk to sleep. Sometime past midnight she was awakened by a left sided chest tightness. This was also accompanied by feelings of anxiety and racing thoughts. She once more took an Ativan. This time, however, her symptoms did not improve. She became concerned because of the persistent left-sided chest tightness and called EMS. By the time she arrived at the emergency department she was discomfort free, and has remained so since that time.  The patient denies any shortness of breath, current chest pain, visual changes, headache, tremors, or instability in gait. She does not report any previous problems with her thyroid. She has been having a dry, nonproductive cough for the last several weeks. Otherwise, she is without significant complaints. She denies suicidal or homicidal thoughts, denies hallucinations, and appears to have goal-directed thoughts. She is not currently experiencing any racing thoughts or elevation of mood.  Past Medical History  Diagnosis Date  . Crohn disease   . Hypertension   . Hyperlipidemia   . CHF (congestive heart failure)     Past Surgical History  Procedure Date  . Cholecystectomy, laparoscopic 01/28/05  . 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 Salpingectomy     left    No family history on  file. Social History:  reports that she quit smoking about 2 months ago. Her smoking use included Cigarettes. She has a 15 pack-year smoking history. She has never used smokeless tobacco. She reports that she does not drink alcohol or use illicit drugs.  Allergies:  Allergies  Allergen Reactions  . Amoxicillin Anaphylaxis and Rash    REACTION:Throat Swelling  . Penicillins Anaphylaxis and Rash  . Gabapentin     Patient had one time seizure shortly after stopping gabapentin  . Aspirin Itching and Rash    REACTION: Rash only.  NO breathing issues with use.   Able to tolerate ASACOL    No current facility-administered medications on file as of 07/11/2011.   Medications Prior to Admission  Medication Sig Dispense Refill  . acetaminophen (TYLENOL) 500 MG tablet Take 1,000 mg by mouth at bedtime as needed. For pain       . calcium-vitamin D (OSCAL WITH D 500-200) 500-200 MG-UNIT per tablet Take 1 tablet by mouth 2 (two) times daily.       . furosemide (LASIX) 20 MG tablet Take 1 tablet (20 mg total) by mouth daily.  90 tablet  3  . hydrochlorothiazide (,MICROZIDE/HYDRODIURIL,) 12.5 MG capsule Take 1 capsule (12.5 mg total) by mouth every morning.  90 capsule  3  . isosorbide mononitrate (IMDUR) 30 MG 24 hr tablet Take 1 tablet (30 mg total) by mouth at bedtime.  90 tablet  3  . lisinopril (PRINIVIL,ZESTRIL) 2.5 MG tablet Take 1 tablet (2.5 mg total) by mouth daily.  90 tablet  3  . LORazepam (ATIVAN) 0.5  MG tablet Take 1 tablet (0.5 mg total) by mouth 2 (two) times daily. For anxiety  60 tablet  5  . mesalamine (ASACOL) 400 MG EC tablet Take 1,200 mg by mouth 2 (two) times daily. 3 tabs twice daily      . omeprazole (PRILOSEC) 20 MG capsule  TAKE 1 CAPSULE BY MOUTH EVERY DAY TO PREVENT REFLUX SYMPTOMS  30 capsule  6  . simvastatin (ZOCOR) 20 MG tablet Take 1 tablet (20 mg total) by mouth at bedtime.  30 tablet  10    Results for orders placed during the hospital encounter of 07/11/11 (from the  past 48 hour(s))  CBC     Status: Abnormal   Collection Time   07/11/11  8:12 AM      Component Value Range Comment   WBC 6.2  4.0 - 10.5 (K/uL)    RBC 3.54 (*) 3.87 - 5.11 (MIL/uL)    Hemoglobin 10.9 (*) 12.0 - 15.0 (g/dL)    HCT 32.8 (*) 36.0 - 46.0 (%)    MCV 92.7  78.0 - 100.0 (fL)    MCH 30.8  26.0 - 34.0 (pg)    MCHC 33.2  30.0 - 36.0 (g/dL)    RDW 13.6  11.5 - 15.5 (%)    Platelets 139 (*) 150 - 400 (K/uL)   DIFFERENTIAL     Status: Normal   Collection Time   07/11/11  8:12 AM      Component Value Range Comment   Neutrophils Relative 45  43 - 77 (%)    Neutro Abs 2.8  1.7 - 7.7 (K/uL)    Lymphocytes Relative 44  12 - 46 (%)    Lymphs Abs 2.7  0.7 - 4.0 (K/uL)    Monocytes Relative 9  3 - 12 (%)    Monocytes Absolute 0.6  0.1 - 1.0 (K/uL)    Eosinophils Relative 2  0 - 5 (%)    Eosinophils Absolute 0.2  0.0 - 0.7 (K/uL)    Basophils Relative 0  0 - 1 (%)    Basophils Absolute 0.0  0.0 - 0.1 (K/uL)   CK TOTAL AND CKMB     Status: Abnormal   Collection Time   07/11/11  8:12 AM      Component Value Range Comment   Total CK 284 (*) 7 - 177 (U/L)    CK, MB 4.5 (*) 0.3 - 4.0 (ng/mL)    Relative Index 1.6  0.0 - 2.5    COMPREHENSIVE METABOLIC PANEL     Status: Abnormal   Collection Time   07/11/11  8:12 AM      Component Value Range Comment   Sodium 137  135 - 145 (mEq/L)    Potassium 3.8  3.5 - 5.1 (mEq/L)    Chloride 107  96 - 112 (mEq/L)    CO2 17 (*) 19 - 32 (mEq/L)    Glucose, Bld 98  70 - 99 (mg/dL)    BUN 23  6 - 23 (mg/dL)    Creatinine, Ser 1.80 (*) 0.50 - 1.10 (mg/dL)    Calcium 9.0  8.4 - 10.5 (mg/dL)    Total Protein 7.6  6.0 - 8.3 (g/dL)    Albumin 3.6  3.5 - 5.2 (g/dL)    AST 21  0 - 37 (U/L)    ALT 23  0 - 35 (U/L)    Alkaline Phosphatase 126 (*) 39 - 117 (U/L)    Total Bilirubin 0.4  0.3 - 1.2 (  mg/dL)    GFR calc non Af Amer 30 (*) >90 (mL/min)    GFR calc Af Amer 35 (*) >90 (mL/min)   TROPONIN I     Status: Normal   Collection Time   07/11/11  8:12 AM       Component Value Range Comment   Troponin I <0.30  <0.30 (ng/mL)   URINALYSIS, ROUTINE W REFLEX MICROSCOPIC     Status: Abnormal   Collection Time   07/11/11  8:34 AM      Component Value Range Comment   Color, Urine YELLOW  YELLOW     APPearance CLOUDY (*) CLEAR     Specific Gravity, Urine 1.014  1.005 - 1.030     pH 5.5  5.0 - 8.0     Glucose, UA NEGATIVE  NEGATIVE (mg/dL)    Hgb urine dipstick NEGATIVE  NEGATIVE     Bilirubin Urine NEGATIVE  NEGATIVE     Ketones, ur NEGATIVE  NEGATIVE (mg/dL)    Protein, ur NEGATIVE  NEGATIVE (mg/dL)    Urobilinogen, UA 0.2  0.0 - 1.0 (mg/dL)    Nitrite NEGATIVE  NEGATIVE     Leukocytes, UA LARGE (*) NEGATIVE    URINE MICROSCOPIC-ADD ON     Status: Normal   Collection Time   07/11/11  8:34 AM      Component Value Range Comment   Squamous Epithelial / LPF RARE  RARE     WBC, UA 7-10  <3 (WBC/hpf)    Urine-Other AMORPHOUS URATES/PHOSPHATES      Dg Chest Port 1 View  07/11/2011  *RADIOLOGY REPORT*  Clinical Data: Chest pain.  PORTABLE CHEST - 1 VIEW  Comparison: Chest x-ray 05/08/2011.  Findings: The cardiac silhouette, mediastinal and hilar contours are stable.  There are chronic emphysematous and bronchitic type lung changes likely related to smoking.  There is a pulmonary scarring are stable.  No definite acute overlying pulmonary process.  IMPRESSION: Chronic emphysematous and bronchitic type lung changes with bibasilar compressive atelectasis but no infiltrates, edema or effusions.  Original Report Authenticated By: P. Kalman Jewels, [name]    Review of Systems  Constitutional: Positive for chills. Negative for fever, weight loss and malaise/fatigue.  HENT: Positive for congestion. Negative for sore throat.   Eyes: Negative for blurred vision and double vision.  Respiratory: Positive for cough. Negative for sputum production.   Cardiovascular: Positive for chest pain and palpitations. Negative for orthopnea and leg swelling.  Gastrointestinal:  Negative for heartburn, nausea, vomiting and abdominal pain.  Genitourinary: Negative for dysuria.  Musculoskeletal: Negative for myalgias.  Skin: Negative for rash.  Neurological: Positive for headaches. Negative for dizziness.  Endo/Heme/Allergies: Does not bruise/bleed easily.  Psychiatric/Behavioral: Negative for depression, suicidal ideas, hallucinations and substance abuse. The patient is nervous/anxious.     Blood pressure 117/71, pulse 82, temperature 98 F (36.7 C), temperature source Oral, resp. rate 17, height 5' 6"  (1.676 m), weight 152 lb (68.947 kg), SpO2 99.00%. Physical Exam  Constitutional: She is oriented to person, place, and time. She appears well-developed and well-nourished. No distress.  HENT:  Head: Normocephalic and atraumatic.  Right Ear: External ear normal.  Left Ear: External ear normal.  Nose: Nose normal.  Mouth/Throat: Oropharynx is clear and moist.  Eyes: Conjunctivae and EOM are normal. Pupils are equal, round, and reactive to light.  Neck: Normal range of motion. Neck supple.  Cardiovascular: Normal rate, regular rhythm, normal heart sounds and intact distal pulses.  Exam reveals no friction rub.  No murmur heard. Respiratory: Effort normal and breath sounds normal.  GI: Soft. Bowel sounds are normal.       Colostomy LLQ, tissue with good perfusion  Musculoskeletal: Normal range of motion.  Neurological: She is alert and oriented to person, place, and time. She has normal reflexes. No cranial nerve deficit. Coordination normal.  Skin: Skin is warm and dry.  Psychiatric: She has a normal mood and affect. Her behavior is normal. Judgment and thought content normal.     Assessment/Plan 1) Chest Pain: Pt is a 61 year old female with CKD, CHF, HTL, HLD who presents with symptoms most likely related to anxiety but with chest tightness that cannot be definitively ruled out as cardiac in origin.  Initial enzymes and EKG are unremarkable.  Pt is not  experiencing any chest pain at this point in time and has been chest pain free since arrival.  Will place in overnight obs on tele, cycle enzymes, and check a TSH.  2) Anxiety: Continue home lorazapam and will start SSRI as anxiety seems to be the patient's major underlying issue.  Does not appear to have tried this therapy in the past.  If the patient needs continued benzo treatment will suggest that patient's PCP consider BID scheduled Klonopin.  3) HTN: Normotensive at this time.  Continue OP meds.  4) Chrons: Cont OP meds.  No evidence of flare  5) CHF: Not active issue currently.  Continue OP meds.  Last EF 30-35% two months ago.  Both systolic and diastolic components.  6) Reflux: Protonix  7) PPx: SQH, Protonix  8) Dispo: anticipate home after rule out (tomorrow AM)  Denaya Horn 07/11/2011, 10:52 AM

## 2011-07-11 NOTE — ED Notes (Addendum)
Pt states, "I woke up around 4 this morning & my chest was hurting. It went to my L shoulder & was hurting under my breast.  I have anxiety problems so I took 30m of lorazepam & it helped.  I wanted to still come to the hospital so when the EMS arrived I came in. I did not have CP when I got to the hospital."

## 2011-07-11 NOTE — ED Provider Notes (Signed)
History     CSN: [id number]  Arrival date & time 07/11/11  0556   First MD Initiated Contact with Patient 07/11/11 [phone]      Chief Complaint  Patient presents with  . Generalized Body Aches    (Consider location/radiation/quality/duration/timing/severity/associated sxs/prior treatment) HPI Patient states she had bad anxiety attack.  Patient states she began feeling anxious and awoke from sleep in early a.m.  Followed by tightness and tingling in chest without radiation.  Pressure waxes and wanes.  Patient took ativan with relief of symptoms.  Patient followed by DR. Hensel in [facility].  Patient has appointment with [facility] cardiology.  Patient hospitalized in November with pneumonia.  Patient has had episodic chest pain for three months.  Patient states she thinks it is anxiety due to the way she feels before the pain and that it seems to go away with ativan.  Denies chest pain except with anxiety symptoms.   Past Medical History  Diagnosis Date  . Crohn disease   . Hypertension   . Hyperlipidemia   . CHF (congestive heart failure)     Past Surgical History  Procedure Date  . Cholecystectomy, laparoscopic 01/28/05  . 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 Salpingectomy     left    No family history on file.  History  Substance Use Topics  . Smoking status: Former Smoker -- 0.5 packs/day for 30 years    Types: Cigarettes    Quit date: 05/10/2011  . Smokeless tobacco: Never Used  . Alcohol Use: No    OB History    Grav Para Term Preterm Abortions TAB SAB Ect Mult Living                  Review of Systems  Allergies  Amoxicillin; Penicillins; Gabapentin; and Aspirin  Home Medications   Current Outpatient Rx  Name Route Sig Dispense Refill  . ACETAMINOPHEN 500 MG PO TABS Oral Take 1,000 mg by mouth at bedtime as needed. For pain     . CALCIUM CARBONATE-VITAMIN D 500-200 MG-UNIT PO TABS Oral Take 1 tablet by mouth 2 (two) times daily.     . FUROSEMIDE 20 MG PO TABS Oral Take  1 tablet (20 mg total) by mouth daily. 90 tablet 3  . HYDROCHLOROTHIAZIDE 12.5 MG PO CAPS Oral Take 1 capsule (12.5 mg total) by mouth every morning. 90 capsule 3  . ISOSORBIDE MONONITRATE ER 30 MG PO TB24 Oral Take 1 tablet (30 mg total) by mouth at bedtime. 90 tablet 3  . LISINOPRIL 2.5 MG PO TABS Oral Take 1 tablet (2.5 mg total) by mouth daily. 90 tablet 3  . LORAZEPAM 0.5 MG PO TABS Oral Take 1 tablet (0.5 mg total) by mouth 2 (two) times daily. For anxiety 60 tablet 5  . MESALAMINE 400 MG PO TBEC Oral Take 1,200 mg by mouth 2 (two) times daily. 3 tabs twice daily    . OMEPRAZOLE 20 MG PO CPDR   TAKE 1 CAPSULE BY MOUTH EVERY DAY TO PREVENT REFLUX SYMPTOMS 30 capsule 6  . SIMVASTATIN 20 MG PO TABS Oral Take 1 tablet (20 mg total) by mouth at bedtime. 30 tablet 10    BP 117/71  Pulse 82  Temp(Src) 98 F (36.7 C) (Oral)  Resp 17  Ht 5' 6"  (1.676 m)  Wt 152 lb (68.947 kg)  BMI 24.53 kg/m2  SpO2 99%  Physical Exam  ED Course  Procedures (including critical care time)  Results for orders placed  during the hospital encounter of 07/11/11  CBC      Component Value Range   WBC 6.2  4.0 - 10.5 (K/uL)   RBC 3.54 (*) 3.87 - 5.11 (MIL/uL)   Hemoglobin 10.9 (*) 12.0 - 15.0 (g/dL)   HCT 32.8 (*) 36.0 - 46.0 (%)   MCV 92.7  78.0 - 100.0 (fL)   MCH 30.8  26.0 - 34.0 (pg)   MCHC 33.2  30.0 - 36.0 (g/dL)   RDW 13.6  11.5 - 15.5 (%)   Platelets 139 (*) 150 - 400 (K/uL)  DIFFERENTIAL      Component Value Range   Neutrophils Relative 45  43 - 77 (%)   Neutro Abs 2.8  1.7 - 7.7 (K/uL)   Lymphocytes Relative 44  12 - 46 (%)   Lymphs Abs 2.7  0.7 - 4.0 (K/uL)   Monocytes Relative 9  3 - 12 (%)   Monocytes Absolute 0.6  0.1 - 1.0 (K/uL)   Eosinophils Relative 2  0 - 5 (%)   Eosinophils Absolute 0.2  0.0 - 0.7 (K/uL)   Basophils Relative 0  0 - 1 (%)   Basophils Absolute 0.0  0.0 - 0.1 (K/uL)  CK TOTAL AND CKMB      Component Value Range   Total CK 284 (*) 7 - 177 (U/L)   CK, MB 4.5 (*)  0.3 - 4.0 (ng/mL)   Relative Index 1.6  0.0 - 2.5   COMPREHENSIVE METABOLIC PANEL      Component Value Range   Sodium 137  135 - 145 (mEq/L)   Potassium 3.8  3.5 - 5.1 (mEq/L)   Chloride 107  96 - 112 (mEq/L)   CO2 17 (*) 19 - 32 (mEq/L)   Glucose, Bld 98  70 - 99 (mg/dL)   BUN 23  6 - 23 (mg/dL)   Creatinine, Ser 1.80 (*) 0.50 - 1.10 (mg/dL)   Calcium 9.0  8.4 - 10.5 (mg/dL)   Total Protein 7.6  6.0 - 8.3 (g/dL)   Albumin 3.6  3.5 - 5.2 (g/dL)   AST 21  0 - 37 (U/L)   ALT 23  0 - 35 (U/L)   Alkaline Phosphatase 126 (*) 39 - 117 (U/L)   Total Bilirubin 0.4  0.3 - 1.2 (mg/dL)   GFR calc non Af Amer 30 (*) >90 (mL/min)   GFR calc Af Amer 35 (*) >90 (mL/min)  TROPONIN I      Component Value Range   Troponin I <0.30  <0.30 (ng/mL)  URINALYSIS, ROUTINE W REFLEX MICROSCOPIC      Component Value Range   Color, Urine YELLOW  YELLOW    APPearance CLOUDY (*) CLEAR    Specific Gravity, Urine 1.014  1.005 - 1.030    pH 5.5  5.0 - 8.0    Glucose, UA NEGATIVE  NEGATIVE (mg/dL)   Hgb urine dipstick NEGATIVE  NEGATIVE    Bilirubin Urine NEGATIVE  NEGATIVE    Ketones, ur NEGATIVE  NEGATIVE (mg/dL)   Protein, ur NEGATIVE  NEGATIVE (mg/dL)   Urobilinogen, UA 0.2  0.0 - 1.0 (mg/dL)   Nitrite NEGATIVE  NEGATIVE    Leukocytes, UA LARGE (*) NEGATIVE   URINE MICROSCOPIC-ADD ON      Component Value Range   Squamous Epithelial / LPF RARE  RARE    WBC, UA 7-10  <3 (WBC/hpf)   Urine-Other AMORPHOUS URATES/PHOSPHATES       Dg Chest Port 1 View  07/11/2011  *RADIOLOGY REPORT*  Clinical Data: Chest pain.  PORTABLE CHEST - 1 VIEW  Comparison: Chest x-Latora Quarry 05/08/2011.  Findings: The cardiac silhouette, mediastinal and hilar contours are stable.  There are chronic emphysematous and bronchitic type lung changes likely related to smoking.  There is a pulmonary scarring are stable.  No definite acute overlying pulmonary process.  IMPRESSION: Chronic emphysematous and bronchitic type lung changes with  bibasilar compressive atelectasis but no infiltrates, edema or effusions.  Original Report Authenticated By: P. Kalman Jewels, [name]   Date: 07/11/2011  Rate: 92  Rhythm: normal sinus rhythm  QRS Axis: normal  Intervals: normal  ST/T Wave abnormalities: nonspecific ST changes  Conduction Disutrbances:nonspecific intraventricular conduction delay  Narrative Interpretation:   Old EKG Reviewed: none available      MDM  Patient has been pain free here. I spoke with the family practice resident no come to see and assess her.  Admission diagnoses chest pain patient requires rule out Anemia improved from prior   Shaune Pollack, MD 07/12/11 [phone]

## 2011-07-11 NOTE — ED Notes (Signed)
EMS called out for CP/ABD c/o.  Pain free when EMS arrived. "I think I had an anxiety attack-I have them".  NSR on 12lead.

## 2011-07-11 NOTE — ED Notes (Signed)
PO fluids provided per pt request

## 2011-07-11 NOTE — ED Notes (Signed)
Resting quietly in bed. No change from previous assessment. Denies pain. Awauiting admission.

## 2011-07-12 ENCOUNTER — Other Ambulatory Visit: Payer: Self-pay

## 2011-07-12 DIAGNOSIS — A059 Bacterial foodborne intoxication, unspecified: Secondary | ICD-10-CM | POA: Diagnosis present

## 2011-07-12 LAB — TSH: TSH: 0.891 u[IU]/mL (ref 0.350–4.500)

## 2011-07-12 LAB — CBC
HCT: 33.7 % — ABNORMAL LOW (ref 36.0–46.0)
Platelets: 174 10*3/uL (ref 150–400)
RDW: 13.5 % (ref 11.5–15.5)
WBC: 5.7 10*3/uL (ref 4.0–10.5)

## 2011-07-12 LAB — TROPONIN I: Troponin I: <0.3 ng/mL (ref <0.30)

## 2011-07-12 LAB — BASIC METABOLIC PANEL
Chloride: 105 mEq/L (ref 96–112)
Creatinine, Ser: 1.72 mg/dL — ABNORMAL HIGH (ref 0.50–1.10)
GFR calc Af Amer: 37 mL/min — ABNORMAL LOW (ref >90)

## 2011-07-12 MED ORDER — CITALOPRAM HYDROBROMIDE 10 MG PO TABS: 10.0000 mg | ORAL_TABLET | Freq: Every day | ORAL | Status: DC

## 2011-07-12 NOTE — Progress Notes (Signed)
Patient ID: [name] [name], female   DOB: [birth year], 61 y.o.   MRN: [id number]  PGY-1 Daily Progress Note Family Medicine Teaching Service Brayton Mars. Melanee Spry, MD Service Pager: [phone]   Subjective: No chest pain or shortness of breath this morning. Patient feels comforted that her labs have come back negative for a heart attack. She says thought this was likely her anxiety but just wanted to make sure.   Objective:  Temp:  [98 F (36.7 C)-98.9 F (37.2 C)] 98.9 F (37.2 C) (01/06 0555) Pulse Rate:  [82-103] 103  (01/06 0555) Resp:  [15-20] 18  (01/06 0555) BP: (100-125)/(62-81) 100/62 mmHg (01/06 0555) SpO2:  [97 %-100 %] 97 % (01/06 0555) Weight:  [150 lb 5.7 oz (68.2 kg)] 150 lb 5.7 oz (68.2 kg) (01/06 0555)  Intake/Output Summary (Last 24 hours) at 07/12/11 0640 Last data filed at 07/12/11 0405  Gross per 24 hour  Intake    603 ml  Output    200 ml  Net    403 ml    Gen:  NAD, sitting up at bedside in the dark.  HEENT: moist mucous membranes CV: Regular rate and rhythm, no murmurs rubs or gallops PULM: clear to auscultation bilaterally. No wheezes/rales/rhonchi ABD: soft/nontender/nondistended/normal bowel sounds. Colostomy bag in place LLQ, skin with good perfusion.  EXT: No edema Neuro: Alert and oriented x3  Labs and imaging:   CBC-pending labs from today.   Lab 07/12/11 0858 07/11/11 0812  WBC 5.7 6.2  HGB 11.1* 10.9*  HCT 33.7* 32.8*  PLT 174 139*   BMET  Lab 07/11/11 0812  NA 137  K 3.8  CL 107  CO2 17*  BUN 23  CREATININE 1.80*  LABGLOM --  GLUCOSE 98  CALCIUM 9.0   CK TOTAL AND CKMB     Status: Abnormal   Collection Time   07/11/11  8:12 AM      Component Value Range   Total CK 284 (*) 7 - 177 (U/L)   CK, MB 4.5 (*) 0.3 - 4.0 (ng/mL)   Relative Index 1.6  0.0 - 2.5   TROPONIN I     Status: Normal   Collection Time   07/11/11  8:12 AM      Component Value Range   Troponin I <0.30  <0.30 (ng/mL)  TSH     Status: Normal   Collection Time     07/11/11  5:40 PM      Component Value Range   TSH 0.891  0.350 - 4.500 (uIU/mL)  TROPONIN I     Status: Normal   Collection Time   07/11/11 11:44 PM      Component Value Range   Troponin I <0.30  <0.30 (ng/mL)   Dg Chest Port 1 View  07/11/2011  *RADIOLOGY REPORT*  Clinical Data: Chest pain.  PORTABLE CHEST - 1 VIEW  Comparison: Chest x-ray 05/08/2011.  Findings: The cardiac silhouette, mediastinal and hilar contours are stable.  There are chronic emphysematous and bronchitic type lung changes likely related to smoking.  There is a pulmonary scarring are stable.  No definite acute overlying pulmonary process.  IMPRESSION: Chronic emphysematous and bronchitic type lung changes with bibasilar compressive atelectasis but no infiltrates, edema or effusions.  Original Report Authenticated By: P. Kalman Jewels, [name]     Assessment  [name] [name] is a 61 year old female with CKD, CHF, HTL, HLD who presents with symptoms most likely related to anxiety but with chest tightness here for ACS rule  out.   1) Chest Pain: suspect anxiety as primary cause of chest pain. Pain has resolved for over 24 hours now.  -cardiac enzymes negative x2 over 16 hours apart and after chest pain had resolved. EKG unremarkable.  -do not need to wait on 3rd cardiac enzyme.  -TSH wnl -potentially would benefit from outpatient stress test given risk factors HTN, HLD.  -would also consider aspirin outpatient  for primary prevention.   2) Anxiety: Started Celexa in house. Does not appear to have tried this therapy (SSRI) in the past.Continue home lorazepam at this time.  If the patient needs continued benzo treatment will suggest that patient's PCP consider BID scheduled Klonopin.  3) HTN: well controlled on HCTZ, Imdur, and lisinopril (home meds) 4) Chrons: Continue Mesalamine. No evidence of flare  5) CHF: Not active issue currently. Continue OP meds. Last EF 30-35% two months ago. Both systolic and diastolic components.  6)  Reflux: Protonix  7)HLD-continue simvastatin.  8) CKD-Stage III with baseline Cr 1.4-1.6. Elevated at 1.8 yesterday. Pending BMET today-only thing pending today.   PPx: SQH, Protonix  Dispo: anticipate discharge after BMET comes back. Has follow up with Dr. Andria Frames on January 9th.  FEN/GI: heart healthy diet.    Garret Reddish, MD PGY1, Family Medicine Teaching Service [phone]

## 2011-07-12 NOTE — Progress Notes (Signed)
Family Medicine Teaching Service Attending Note  I discussed patient [name] [name]  with Dr. Yong Channel and reviewed their note for today.  I agree with their assessment and plan.

## 2011-07-12 NOTE — Discharge Summary (Signed)
Physician Discharge Summary  Patient ID: [name] [name] MRN: [id number] DOB: 04-11-1951 Age: 61 y.o.  Admit date: 07/11/2011 Discharge date: 07/12/2011  PCP: Zigmund Gottron, MD of [facility]    Discharge Diagnosis: Principal Problem:  *Chest pain, ACS rule out   Active Problems:  HYPERCHOLESTEROLEMIA  Anxiety  Reflux esophagitis  CROHN'S DISEASE  Hypertension, benign  Combined systolic and diastolic heart failure    Hospital Course [name] [name] is a 61 year old female with CKD, CHF, HTL, HLD who presented with symptoms most likely related to anxiety but with chest tightness here for ACS rule out.  1) Chest Pain: suspect anxiety as primary cause of chest pain. Pain was resolved since time of admission.  -cardiac enzymes negative x3.  EKG unremarkable.  -TSH wnl  -potentially would benefit from outpatient stress test given risk factors HTN, HLD. Appears patient already has cardiology follow up in March of this year  -would also consider aspirin outpatient for primary prevention.   2) Anxiety: Started Celexa in house. Does not appear to have tried this therapy (SSRI) in the past.Continue home lorazepam at this time. If the patient needs continued benzo treatment will suggest that patient's PCP consider BID scheduled Klonopin.  3) HTN: well controlled on HCTZ, Imdur, and lisinopril (home meds)  4) Chrons: Continue Mesalamine. No evidence of flare  5) CHF: Not active issue currently. Continue OP meds. Last EF 30-35% two months ago. Both systolic and diastolic components.  6) Reflux: Protonix  7)HLD-continue simvastatin.  8) CKD-Stage III with baseline Cr 1.4-1.6. Elevated at 1.8 on admission, improved to 1.72 on day of discharge. May represent worsening renal function.      Procedures/Imaging:  Dg Chest Port 1 View  07/11/2011  *RADIOLOGY REPORT*  Clinical Data: Chest pain.  PORTABLE CHEST - 1 VIEW  Comparison: Chest x-ray 05/08/2011.  Findings: The cardiac  silhouette, mediastinal and hilar contours are stable.  There are chronic emphysematous and bronchitic type lung changes likely related to smoking.  There is a pulmonary scarring are stable.  No definite acute overlying pulmonary process.  IMPRESSION: Chronic emphysematous and bronchitic type lung changes with bibasilar compressive atelectasis but no infiltrates, edema or effusions.  Original Report Authenticated By: P. Kalman Jewels, [name]    Labs  CBC  Lab 07/12/11 0858 07/11/11 0812  WBC 5.7 6.2  HGB 11.1* 10.9*  HCT 33.7* 32.8*  PLT 174 139*   BMET  Lab 07/12/11 0858 07/11/11 0812  NA 136 137  K 3.6 3.8  CL 105 107  CO2 17* 17*  BUN 24* 23  CREATININE 1.72* 1.80*  CALCIUM 10.2 9.0  PROT -- 7.6  BILITOT -- 0.4  ALKPHOS -- 126*  ALT -- 23  AST -- 21  GLUCOSE 137* 98       Patient condition at time of discharge/disposition: stable  Disposition-home   Follow up issues: 1. potentially would benefit from outpatient stress test given risk factors HTN, HLD 2. Could consider aspirin for primary prevention.  3. Please follow up to see how patient is doing since being started on Celexa in house and monitor for side effects.  4.  If the patient needs continued benzo treatment would consider BID scheduled Klonopin. It does not appear that anxiety is well controlled even thoguh patient is essentially taking ativan on scheduled basis.    Discharge follow up:  Follow-up Information    Follow up with Zigmund Gottron on 07/15/2011.        Discharge Orders  Future Appointments: Provider: Department: Dept Phone: Center:   07/15/2011 1:30 PM Zigmund Gottron [facility] [phone] [facility]   09/09/2011 9:30 AM Darden Amber., MD [facility] [phone] None      Discharge Medications  [place], [place]  Home Medication Instructions WCB:[id number]   Printed on:07/13/11 0029  Medication Information                    calcium-vitamin D (OSCAL WITH D 500-200)  500-200 MG-UNIT per tablet Take 1 tablet by mouth 2 (two) times daily.            mesalamine (ASACOL) 400 MG EC tablet Take 1,200 mg by mouth 2 (two) times daily. 3 tabs twice daily           hydrochlorothiazide (,MICROZIDE/HYDRODIURIL,) 12.5 MG capsule Take 1 capsule (12.5 mg total) by mouth every morning.           simvastatin (ZOCOR) 20 MG tablet Take 1 tablet (20 mg total) by mouth at bedtime.           acetaminophen (TYLENOL) 500 MG tablet Take 1,000 mg by mouth at bedtime as needed. For pain            furosemide (LASIX) 20 MG tablet Take 1 tablet (20 mg total) by mouth daily.           lisinopril (PRINIVIL,ZESTRIL) 2.5 MG tablet Take 1 tablet (2.5 mg total) by mouth daily.           isosorbide mononitrate (IMDUR) 30 MG 24 hr tablet Take 1 tablet (30 mg total) by mouth at bedtime.           LORazepam (ATIVAN) 0.5 MG tablet Take 1 tablet (0.5 mg total) by mouth 2 (two) times daily. For anxiety           omeprazole (PRILOSEC) 20 MG capsule  TAKE 1 CAPSULE BY MOUTH EVERY DAY TO PREVENT REFLUX SYMPTOMS           citalopram (CELEXA) 10 MG tablet Take 1 tablet (10 mg total) by mouth daily.                 Garret Reddish, MD of [facility] 07/12/2011 10:07 AM

## 2011-07-12 NOTE — Progress Notes (Signed)
I was called to the patient's room prior to patient being discharged for an incident where her heart rate went to the 140s while up using the restroom. Her heart rate was reported to stay in the 140s for approximately 15 minutes which was the duration of the time the patient was in the bathroom.  Patient did complain of some palpitations however, there were no irregular heart beats and her telemetry reading was sinus tachycardia.  She has no concerns at this time about going home and her cardiac enzymes are reassuring for her chest pain being noncardiac in origin. This is felt to be an accentuated physiologic response  coupled with generalized anxiety disorder leading to a sinus tachycardia while both anticipating and while using the bathroom.  She complained of no chest pain at this time he feels much better than prior to arrival.   [place] continue with discharge plans as originally discussed and discharge summary will be completed by Dr. Yong Channel.   Teresa Coombs, DO [facility] Resident - PGY-1 07/12/2011 2:56 PM

## 2011-07-12 NOTE — Significant Event (Signed)
Spoke to Teresa Coombs, resident about patients heart rate increasing to 140's and sustaining for 2-3 minutes. BP 112/74. Pt complaining to heart racing, other wise no chest pain, nausea or vomiting. Instructed to hold patient till 3pm. Resident stating he will be back by then to re-evaluate pt"s condition. Gayleen Orem, RN

## 2011-07-12 NOTE — Progress Notes (Signed)
Pts. Heart rate elevated to 130s-140s while ambulating to bathroom.  Patient asymptomatic.  Once back in bed HR back down to 90s-low 100s.   Will continue to monitor patient.   [name] [name]

## 2011-07-13 NOTE — Discharge Summary (Signed)
I have reviewed this discharge summary and agree.    

## 2011-07-15 ENCOUNTER — Encounter: Payer: Self-pay | Admitting: Family Medicine

## 2011-07-15 ENCOUNTER — Ambulatory Visit (INDEPENDENT_AMBULATORY_CARE_PROVIDER_SITE_OTHER): Payer: Medicare Other | Admitting: Family Medicine

## 2011-07-15 DIAGNOSIS — I5040 Unspecified combined systolic (congestive) and diastolic (congestive) heart failure: Secondary | ICD-10-CM

## 2011-07-15 DIAGNOSIS — F411 Generalized anxiety disorder: Secondary | ICD-10-CM

## 2011-07-15 DIAGNOSIS — R079 Chest pain, unspecified: Secondary | ICD-10-CM

## 2011-07-15 DIAGNOSIS — F419 Anxiety disorder, unspecified: Secondary | ICD-10-CM

## 2011-07-15 MED ORDER — VALSARTAN 80 MG PO TABS: 80.0000 mg | ORAL_TABLET | Freq: Every day | ORAL | Status: DC

## 2011-07-15 MED ORDER — METOPROLOL SUCCINATE ER 50 MG PO TB24: 50.0000 mg | ORAL_TABLET | Freq: Every day | ORAL | Status: DC

## 2011-07-15 NOTE — Patient Instructions (Signed)
Stop your hydrochlorothiazide. Stop your lisinopril.  This may be causing your dry cough. Start two new medications: I sent prescriptions to the pharmacy. Blood work today.   See me in one week.

## 2011-07-15 NOTE — Progress Notes (Signed)
  Subjective:    Patient ID: [name] [name], female    DOB: Mar 22, 1951, 61 y.o.   MRN: [id number]  HPI  Fu hospitalization and told anxiety attack.  Also C/O dry cough.  No chest pain.  No edema.  Creat is up.  Also FU of new CHF  Here are echo results from 05/10/11:  Left ventricle: The cavity size was at the upper limits of normal. Wall thickness was normal. Systolic function was moderately to severely reduced. The estimated ejection fraction was in the range of 30% to 35%. There is akinesis of the lateral and inferior myocardium. There is hypokinesis of the anterior myocardium. Doppler parameters are consistent with abnormal left ventricular relaxation (grade 1 diastolic dysfunction). Doppler parameters are consistent with high ventricular filling pressure. - Aortic valve: Trivial regurgitation. - Mitral valve: Mobility of the posterior leaflet was restricted. Mild regurgitation directed posteriorly. - Left atrium: The atrium was mildly dilated. - Pericardium, extracardiac: A trivial pericardial effusion was identified. Transthoracic echocardiography. M-mode, complete 2D, spectral Doppler, and color Doppler. Height: Height: 167.6cm. Height: 66in. Weight: Weight: 69.4kg. Weight: 152.7lb. Body mass index: BMI: 24.7kg/m^2. Body surface area: BSA: 1.28m2. Blood pressure: 123/82. Patient status: Inpatient. Location: Bedside.  No complaints of swelling.  No SOB.  Does have chronic dry cough which start about the same time as her ACE.         Review of Systems     Objective:   Physical Exam Lungs clear Cardiac RRR without m or g Ext no edema        Assessment & Plan:

## 2011-07-16 LAB — BASIC METABOLIC PANEL WITH GFR
CO2: 14 mEq/L — ABNORMAL LOW (ref 19–32)
Calcium: 9.3 mg/dL (ref 8.4–10.5)
Sodium: 133 mEq/L — ABNORMAL LOW (ref 135–145)

## 2011-07-16 NOTE — Assessment & Plan Note (Addendum)
Resolved.  Reviewing echo, she has some wall motion abnormalities and may need further cardiac WU.  She has an appointment with a cardiologist already.

## 2011-07-16 NOTE — Assessment & Plan Note (Signed)
Clearly has CHF Not on Beta blocker, I will add.  I think she is overdiuresed.  I stopped her HCTZ.  After she left and before I completed note, her BMP showed significant bump in renal dysfunction.  I called and told her to stop her lasix.  She will see me in one week.

## 2011-07-16 NOTE — Assessment & Plan Note (Signed)
Add celexa.

## 2011-07-24 ENCOUNTER — Ambulatory Visit: Payer: Medicare Other | Admitting: Family Medicine

## 2011-07-29 ENCOUNTER — Ambulatory Visit (INDEPENDENT_AMBULATORY_CARE_PROVIDER_SITE_OTHER): Payer: Medicare Other | Admitting: Family Medicine

## 2011-07-29 ENCOUNTER — Encounter: Payer: Self-pay | Admitting: Family Medicine

## 2011-07-29 VITALS — BP 112/68 | Temp 98.7°F | Ht 66.0 in | Wt 153.5 lb

## 2011-07-29 DIAGNOSIS — N182 Chronic kidney disease, stage 2 (mild): Secondary | ICD-10-CM

## 2011-07-29 DIAGNOSIS — N189 Chronic kidney disease, unspecified: Secondary | ICD-10-CM

## 2011-07-29 DIAGNOSIS — N179 Acute kidney failure, unspecified: Secondary | ICD-10-CM

## 2011-07-29 DIAGNOSIS — R079 Chest pain, unspecified: Secondary | ICD-10-CM

## 2011-07-29 DIAGNOSIS — I5022 Chronic systolic (congestive) heart failure: Secondary | ICD-10-CM

## 2011-07-29 DIAGNOSIS — I509 Heart failure, unspecified: Secondary | ICD-10-CM

## 2011-07-29 DIAGNOSIS — I10 Essential (primary) hypertension: Secondary | ICD-10-CM

## 2011-07-29 NOTE — Patient Instructions (Signed)
You seem to be doing great on your current medications. Please stay on them and take this note with you to make sure he has the up to date list of your medications. I stopped all your fluid pills because they were too hard on your kidneys.  I am rechecking your kidneys today with blood work. See me after you have seen Dr. Amedeo Plenty and the cardiologist.  I still need to do a pap smear on you.

## 2011-07-30 DIAGNOSIS — N179 Acute kidney failure, unspecified: Secondary | ICD-10-CM | POA: Insufficient documentation

## 2011-07-30 DIAGNOSIS — N184 Chronic kidney disease, stage 4 (severe): Secondary | ICD-10-CM | POA: Insufficient documentation

## 2011-07-30 LAB — BASIC METABOLIC PANEL WITH GFR
BUN: 24 mg/dL — ABNORMAL HIGH (ref 6–23)
CO2: 15 mEq/L — ABNORMAL LOW (ref 19–32)
Calcium: 9 mg/dL (ref 8.4–10.5)
Chloride: 113 mEq/L — ABNORMAL HIGH (ref 96–112)
Creat: 1.95 mg/dL — ABNORMAL HIGH (ref 0.50–1.10)
GFR, Est African American: 32 mL/min — ABNORMAL LOW
GFR, Est Non African American: 28 mL/min — ABNORMAL LOW
Glucose, Bld: 80 mg/dL (ref 70–99)
Potassium: 4.6 mEq/L (ref 3.5–5.3)
Sodium: 138 mEq/L (ref 135–145)

## 2011-07-30 NOTE — Assessment & Plan Note (Signed)
Well controled

## 2011-07-30 NOTE — Progress Notes (Signed)
  Subjective:    Patient ID: [name] [name], female    DOB: 09/20/1950, 61 y.o.   MRN: [id number]  HPI Follow up CHF, iatrogenic hypotension and renal failure likely due to overmedication - see last note.  Patient has done well off diuretics and surprisingly there has been no change in her weight.  She denies leg swelling or SOB.  She does complain of lingering cough, but it is much improved now that she is off diuretics.  Despite mult med switches, BP is fine.  Had acute renal failure likely due to overdiuresis.  I did not hold her ACE/ARB and need to recheck BMP    Review of Systems     Objective:   Physical Exam Lungs clear Cardiac RRR without m or g Ext no edema       Assessment & Plan:

## 2011-07-30 NOTE — Assessment & Plan Note (Signed)
Appears to have progress to stage 3

## 2011-07-30 NOTE — Assessment & Plan Note (Signed)
Recheck BMP - which has improved but not yet at baseline.  Weighing risks and benefits, will continue ARB and follow closely

## 2011-07-30 NOTE — Assessment & Plan Note (Signed)
No chest pain

## 2011-07-30 NOTE — Assessment & Plan Note (Signed)
Euvolemic despite holding diuretics.

## 2011-08-19 ENCOUNTER — Other Ambulatory Visit: Payer: Self-pay | Admitting: Family Medicine

## 2011-08-19 NOTE — Telephone Encounter (Signed)
Refill request

## 2011-08-26 ENCOUNTER — Other Ambulatory Visit: Payer: Self-pay

## 2011-08-26 ENCOUNTER — Inpatient Hospital Stay (HOSPITAL_COMMUNITY)
Admission: EM | Admit: 2011-08-26 | Discharge: 2011-08-31 | DRG: 683 | Disposition: A | Discharge status: Home / Self Care | Payer: Medicare Other | Attending: Family Medicine | Admitting: Family Medicine

## 2011-08-26 DIAGNOSIS — M549 Dorsalgia, unspecified: Secondary | ICD-10-CM | POA: Diagnosis present

## 2011-08-26 DIAGNOSIS — K5090 Crohn's disease, unspecified, without complications: Secondary | ICD-10-CM | POA: Diagnosis present

## 2011-08-26 DIAGNOSIS — E875 Hyperkalemia: Secondary | ICD-10-CM | POA: Diagnosis not present

## 2011-08-26 DIAGNOSIS — Q638 Other specified congenital malformations of kidney: Secondary | ICD-10-CM

## 2011-08-26 DIAGNOSIS — I5022 Chronic systolic (congestive) heart failure: Secondary | ICD-10-CM | POA: Diagnosis present

## 2011-08-26 DIAGNOSIS — N184 Chronic kidney disease, stage 4 (severe): Secondary | ICD-10-CM | POA: Diagnosis present

## 2011-08-26 DIAGNOSIS — N170 Acute kidney failure with tubular necrosis: Principal | ICD-10-CM | POA: Diagnosis present

## 2011-08-26 DIAGNOSIS — G609 Hereditary and idiopathic neuropathy, unspecified: Secondary | ICD-10-CM | POA: Diagnosis present

## 2011-08-26 DIAGNOSIS — Z79899 Other long term (current) drug therapy: Secondary | ICD-10-CM | POA: Diagnosis present

## 2011-08-26 DIAGNOSIS — Z87891 Personal history of nicotine dependence: Secondary | ICD-10-CM

## 2011-08-26 DIAGNOSIS — I509 Heart failure, unspecified: Secondary | ICD-10-CM | POA: Diagnosis present

## 2011-08-26 DIAGNOSIS — M5416 Radiculopathy, lumbar region: Secondary | ICD-10-CM | POA: Diagnosis present

## 2011-08-26 DIAGNOSIS — F419 Anxiety disorder, unspecified: Secondary | ICD-10-CM | POA: Diagnosis present

## 2011-08-26 DIAGNOSIS — E871 Hypo-osmolality and hyponatremia: Secondary | ICD-10-CM | POA: Diagnosis present

## 2011-08-26 DIAGNOSIS — I129 Hypertensive chronic kidney disease with stage 1 through stage 4 chronic kidney disease, or unspecified chronic kidney disease: Secondary | ICD-10-CM | POA: Diagnosis present

## 2011-08-26 DIAGNOSIS — E872 Acidosis, unspecified: Secondary | ICD-10-CM | POA: Diagnosis present

## 2011-08-26 DIAGNOSIS — N183 Chronic kidney disease, stage 3 unspecified: Secondary | ICD-10-CM | POA: Diagnosis present

## 2011-08-26 DIAGNOSIS — F411 Generalized anxiety disorder: Secondary | ICD-10-CM | POA: Diagnosis present

## 2011-08-26 DIAGNOSIS — I10 Essential (primary) hypertension: Secondary | ICD-10-CM | POA: Diagnosis present

## 2011-08-26 DIAGNOSIS — G40909 Epilepsy, unspecified, not intractable, without status epilepticus: Secondary | ICD-10-CM | POA: Diagnosis present

## 2011-08-26 DIAGNOSIS — N179 Acute kidney failure, unspecified: Secondary | ICD-10-CM | POA: Diagnosis present

## 2011-08-26 DIAGNOSIS — N189 Chronic kidney disease, unspecified: Secondary | ICD-10-CM

## 2011-08-26 DIAGNOSIS — M545 Low back pain, unspecified: Secondary | ICD-10-CM | POA: Diagnosis present

## 2011-08-26 DIAGNOSIS — R197 Diarrhea, unspecified: Secondary | ICD-10-CM | POA: Diagnosis present

## 2011-08-26 DIAGNOSIS — Z932 Ileostomy status: Secondary | ICD-10-CM

## 2011-08-26 DIAGNOSIS — M5380 Other specified dorsopathies, site unspecified: Secondary | ICD-10-CM | POA: Diagnosis present

## 2011-08-26 DIAGNOSIS — E7800 Pure hypercholesterolemia, unspecified: Secondary | ICD-10-CM | POA: Diagnosis present

## 2011-08-26 LAB — COMPREHENSIVE METABOLIC PANEL
ALT: 34 U/L (ref 0–35)
AST: 18 U/L (ref 0–37)
Albumin: 3.9 g/dL (ref 3.5–5.2)
CO2: 11 mEq/L — ABNORMAL LOW (ref 19–32)
Chloride: 105 mEq/L (ref 96–112)
GFR calc non Af Amer: 9 mL/min — ABNORMAL LOW (ref >90)
Sodium: 129 mEq/L — ABNORMAL LOW (ref 135–145)
Total Bilirubin: 0.3 mg/dL (ref 0.3–1.2)

## 2011-08-26 LAB — CBC
Platelets: 198 10*3/uL (ref 150–400)
RBC: 3.95 MIL/uL (ref 3.87–5.11)
WBC: 8.3 10*3/uL (ref 4.0–10.5)

## 2011-08-26 MED ORDER — SODIUM CHLORIDE 0.9 % IV BOLUS (SEPSIS)
1000.0000 mL | Freq: Once | INTRAVENOUS | Status: AC
  Administered 2011-08-26: 1000 mL via INTRAVENOUS

## 2011-08-26 MED ORDER — FENTANYL CITRATE 0.05 MG/ML IJ SOLN
25.0000 ug | Freq: Once | INTRAMUSCULAR | Status: AC
  Administered 2011-08-26: 25 ug via INTRAVENOUS
  Filled 2011-08-26: qty 2

## 2011-08-26 MED ORDER — ONDANSETRON HCL 4 MG/2ML IJ SOLN
4.0000 mg | Freq: Once | INTRAMUSCULAR | Status: AC
  Administered 2011-08-26: 4 mg via INTRAVENOUS
  Filled 2011-08-26: qty 2

## 2011-08-26 NOTE — ED Notes (Signed)
Pt to ed for eval of R flank pain; pt reports that pain started 13:00, pt sat on side of bed, and pain started; pt reports pain wouldn't go away; pt denies dysuria, urinary frequency; pt a&OX4, no neuro deficits noted; pt reports that colostomy has had more watery stool lately; pt denies pain with palpation

## 2011-08-26 NOTE — ED Notes (Signed)
Pt unable to urinate at this time will reassess in a few minutes

## 2011-08-26 NOTE — ED Notes (Signed)
Patient C/O severe right flank pain that began at 1 PM today.  Denies hematuria. Pain does not move and she describes it as a sharp cramping pain.

## 2011-08-26 NOTE — ED Notes (Addendum)
Per EMS; about 1pm, pt started to have lower back pain; c/o spasms intermittently; pt reports she has hx, but not this intense; pt was hypotension, no dizziness, no n/v; pt a&OX4; BP 88-90; HR NSR 80; pt has colostomy, hx of crohns

## 2011-08-27 ENCOUNTER — Other Ambulatory Visit (HOSPITAL_COMMUNITY): Payer: Medicare Other

## 2011-08-27 ENCOUNTER — Encounter (HOSPITAL_COMMUNITY): Payer: Self-pay | Admitting: *Deleted

## 2011-08-27 ENCOUNTER — Emergency Department (HOSPITAL_COMMUNITY): Payer: Medicare Other

## 2011-08-27 DIAGNOSIS — R109 Unspecified abdominal pain: Secondary | ICD-10-CM

## 2011-08-27 DIAGNOSIS — N179 Acute kidney failure, unspecified: Secondary | ICD-10-CM

## 2011-08-27 DIAGNOSIS — M5416 Radiculopathy, lumbar region: Secondary | ICD-10-CM | POA: Diagnosis present

## 2011-08-27 DIAGNOSIS — R197 Diarrhea, unspecified: Secondary | ICD-10-CM | POA: Diagnosis present

## 2011-08-27 DIAGNOSIS — M549 Dorsalgia, unspecified: Secondary | ICD-10-CM | POA: Diagnosis present

## 2011-08-27 DIAGNOSIS — M545 Low back pain, unspecified: Secondary | ICD-10-CM | POA: Diagnosis present

## 2011-08-27 LAB — CBC
HCT: 30.7 % — ABNORMAL LOW (ref 36.0–46.0)
Hemoglobin: 10.4 g/dL — ABNORMAL LOW (ref 12.0–15.0)
MCH: 30.8 pg (ref 26.0–34.0)
MCV: 90.8 fL (ref 78.0–100.0)
Platelets: 175 10*3/uL (ref 150–400)
RBC: 3.65 MIL/uL — ABNORMAL LOW (ref 3.87–5.11)
RBC: 3.38 MIL/uL — ABNORMAL LOW (ref 3.87–5.11)
RDW: 14.4 % (ref 11.5–15.5)
WBC: 7.4 10*3/uL (ref 4.0–10.5)

## 2011-08-27 LAB — RENAL FUNCTION PANEL
BUN: 30 mg/dL — ABNORMAL HIGH (ref 6–23)
CO2: 15 mEq/L — ABNORMAL LOW (ref 19–32)
Chloride: 108 mEq/L (ref 96–112)
Creatinine, Ser: 4.57 mg/dL — ABNORMAL HIGH (ref 0.50–1.10)

## 2011-08-27 LAB — PRO B NATRIURETIC PEPTIDE: Pro B Natriuretic peptide (BNP): 207.1 pg/mL — ABNORMAL HIGH (ref 0–125)

## 2011-08-27 LAB — URINALYSIS, ROUTINE W REFLEX MICROSCOPIC
Leukocytes, UA: NEGATIVE
Nitrite: NEGATIVE
Specific Gravity, Urine: 1.03 (ref 1.005–1.030)
Urobilinogen, UA: 0.2 mg/dL (ref 0.0–1.0)

## 2011-08-27 LAB — COMPREHENSIVE METABOLIC PANEL
BUN: 29 mg/dL — ABNORMAL HIGH (ref 6–23)
CO2: 10 mEq/L — CL (ref 19–32)
Calcium: 8.5 mg/dL (ref 8.4–10.5)
Creatinine, Ser: 4.66 mg/dL — ABNORMAL HIGH (ref 0.50–1.10)
GFR calc Af Amer: 11 mL/min — ABNORMAL LOW (ref >90)
GFR calc non Af Amer: 9 mL/min — ABNORMAL LOW (ref >90)
Glucose, Bld: 110 mg/dL — ABNORMAL HIGH (ref 70–99)
Sodium: 130 mEq/L — ABNORMAL LOW (ref 135–145)
Total Protein: 7.2 g/dL (ref 6.0–8.3)

## 2011-08-27 LAB — MAGNESIUM: Magnesium: 1.2 mg/dL — ABNORMAL LOW (ref 1.5–2.5)

## 2011-08-27 LAB — URINE MICROSCOPIC-ADD ON

## 2011-08-27 LAB — LACTIC ACID, PLASMA: Lactic Acid, Venous: 0.6 mmol/L (ref 0.5–2.2)

## 2011-08-27 LAB — PHOSPHORUS: Phosphorus: 4.6 mg/dL (ref 2.3–4.6)

## 2011-08-27 LAB — CREATININE, URINE, RANDOM: Creatinine, Urine: 193.35 mg/dL

## 2011-08-27 MED ORDER — SODIUM CHLORIDE 0.9 % IV BOLUS (SEPSIS)
1000.0000 mL | Freq: Once | INTRAVENOUS | Status: AC
  Administered 2011-08-27: 1000 mL via INTRAVENOUS

## 2011-08-27 MED ORDER — ACETAMINOPHEN 650 MG RE SUPP: 650.0000 mg | Freq: Four times a day (QID) | RECTAL | Status: DC | PRN

## 2011-08-27 MED ORDER — CIPROFLOXACIN HCL 500 MG PO TABS
ORAL_TABLET | ORAL | Status: AC
  Filled 2011-08-27: qty 1

## 2011-08-27 MED ORDER — CIPROFLOXACIN HCL 500 MG PO TABS: 500.0000 mg | ORAL_TABLET | Freq: Every day | ORAL | Status: DC

## 2011-08-27 MED ORDER — FENTANYL CITRATE 0.05 MG/ML IJ SOLN
25.0000 ug | Freq: Once | INTRAMUSCULAR | Status: AC
  Administered 2011-08-27: 25 ug via INTRAVENOUS
  Filled 2011-08-27: qty 2

## 2011-08-27 MED ORDER — ACETAMINOPHEN 325 MG PO TABS
650.0000 mg | ORAL_TABLET | Freq: Four times a day (QID) | ORAL | Status: DC | PRN
  Administered 2011-08-28: 650 mg via ORAL
  Filled 2011-08-27 (×2): qty 2

## 2011-08-27 MED ORDER — METRONIDAZOLE 500 MG PO TABS
500.0000 mg | ORAL_TABLET | Freq: Three times a day (TID) | ORAL | Status: DC
  Administered 2011-08-27: 500 mg via ORAL
  Filled 2011-08-27: qty 1

## 2011-08-27 MED ORDER — MESALAMINE 400 MG PO TBEC
1200.0000 mg | DELAYED_RELEASE_TABLET | Freq: Two times a day (BID) | ORAL | Status: DC
  Administered 2011-08-27 – 2011-08-31 (×9): 1200 mg via ORAL
  Filled 2011-08-27 (×8): qty 3
  Filled 2011-08-27: qty 1
  Filled 2011-08-27 (×3): qty 3

## 2011-08-27 MED ORDER — CIPROFLOXACIN IN D5W 400 MG/200ML IV SOLN
400.0000 mg | Freq: Once | INTRAVENOUS | Status: AC
  Administered 2011-08-27: 400 mg via INTRAVENOUS
  Filled 2011-08-27: qty 200

## 2011-08-27 MED ORDER — METRONIDAZOLE IN NACL 5-0.79 MG/ML-% IV SOLN
500.0000 mg | Freq: Once | INTRAVENOUS | Status: AC
  Administered 2011-08-27: 500 mg via INTRAVENOUS
  Filled 2011-08-27: qty 100

## 2011-08-27 MED ORDER — CIPROFLOXACIN HCL 500 MG PO TABS
250.0000 mg | ORAL_TABLET | Freq: Two times a day (BID) | ORAL | Status: DC
  Filled 2011-08-27: qty 2

## 2011-08-27 MED ORDER — PANTOPRAZOLE SODIUM 40 MG PO TBEC
40.0000 mg | DELAYED_RELEASE_TABLET | Freq: Every day | ORAL | Status: DC
  Administered 2011-08-27 – 2011-08-31 (×5): 40 mg via ORAL
  Filled 2011-08-27 (×5): qty 1

## 2011-08-27 MED ORDER — CITALOPRAM HYDROBROMIDE 10 MG PO TABS
10.0000 mg | ORAL_TABLET | Freq: Every day | ORAL | Status: DC
  Administered 2011-08-27 – 2011-08-31 (×5): 10 mg via ORAL
  Filled 2011-08-27 (×5): qty 1

## 2011-08-27 MED ORDER — SODIUM BICARBONATE 8.4 % IV SOLN
INTRAVENOUS | Status: DC
  Administered 2011-08-27 (×2): via INTRAVENOUS
  Filled 2011-08-27 (×6): qty 150

## 2011-08-27 MED ORDER — SIMVASTATIN 20 MG PO TABS
20.0000 mg | ORAL_TABLET | Freq: Every day | ORAL | Status: DC
  Administered 2011-08-27 – 2011-08-30 (×4): 20 mg via ORAL
  Filled 2011-08-27 (×5): qty 1

## 2011-08-27 MED ORDER — HEPARIN SODIUM (PORCINE) 5000 UNIT/ML IJ SOLN
5000.0000 [IU] | Freq: Three times a day (TID) | INTRAMUSCULAR | Status: DC
  Administered 2011-08-27 – 2011-08-31 (×12): 5000 [IU] via SUBCUTANEOUS
  Filled 2011-08-27 (×17): qty 1

## 2011-08-27 NOTE — ED Notes (Addendum)
Attempted to call report, unable to. Caryl Pina, RN questioning why pt requires telemetry. Stated they will call MD to inquire.

## 2011-08-27 NOTE — ED Notes (Signed)
Admitting dr notified about change in renal vascular ultrasound date and need for NPO status after midnight.

## 2011-08-27 NOTE — Consult Note (Signed)
Reason for Consult:ARF Referring Physician: [name], MD  [name] [name] is an 61 y.o. female.  HPI: Pt is a 61yo AAF with PMH sig for crohn's disease, htn, systolic CHF, and CKD stage III (baseline creat 1.2-1.7) who was admitted by the [facility] teaching service with 5 day h/o low back pain (right-sided), described as sharp.  Pt also reports increasing diarrhea over the last 48 hours and generalized malaise.  We were asked to see the patient due to AKI on CKD with peak creat of 4.7 yesterday.  Pt was noted to be hypotensive with SBP <80 and was treated with IVF's.  Pt has had episodes of AKI on CKD in the past related to decompensated CHF in setting of ACE or ARB as well as volume depletion from Crohn's disease. She reports decreased UOP PTA and denies any NSAIDs or COX-II inhibitors.  Was on valsartan PTA but dc'd at some point.  Trend in Creatinine: Creat  Date/Time Value Range Status  07/29/2011  4:33 PM 1.95* 0.50-1.10 (mg/dL) Final  07/15/2011  2:01 PM 2.60* 0.50-1.10 (mg/dL) Final     Result repeated and verified.  05/15/2011  2:05 PM 1.59* 0.50-1.10 (mg/dL) Final  02/25/2011 12:24 PM 1.38* 0.50-1.10 (mg/dL) Final  09/24/2010  4:29 PM 1.55* 0.40-1.20 (mg/dL) Final     Creatinine, Ser  Date/Time Value Range Status  08/27/2011  4:19 AM 4.66* 0.50-1.10 (mg/dL) Final  08/26/2011 11:01 PM 4.72* 0.50-1.10 (mg/dL) Final  07/12/2011  8:58 AM 1.72* 0.50-1.10 (mg/dL) Final  07/11/2011  8:12 AM 1.80* 0.50-1.10 (mg/dL) Final  05/11/2011 12:20 AM 1.41* 0.50-1.10 (mg/dL) Final  05/09/2011  6:30 AM 1.35* 0.50-1.10 (mg/dL) Final  05/08/2011  8:00 AM 1.47* 0.50-1.10 (mg/dL) Final  03/04/2011  4:19 PM 1.46* 0.50-1.10 (mg/dL) Final  01/16/2011  4:10 AM 1.31* 0.50-1.10 (mg/dL) Final     **Please note change in reference range.**  01/08/2011  2:25 PM 1.37* 0.50-1.10 (mg/dL) Final     **Please note change in reference range.**  12/05/2010  3:20 PM 1.30* 0.4-1.2 (mg/dL) Final  11/19/2010  3:30 PM 1.30* 0.4-1.2 (mg/dL) Final    11/18/2010  6:10 AM 1.41* 0.4-1.2 (mg/dL) Final  11/08/2010  4:56 AM 1.23* 0.4-1.2 (mg/dL) Final  11/07/2010  3:11 AM 1.42* 0.4-1.2 (mg/dL) Final  10/09/2009  9:24 PM 1.02  0.40-1.20 (mg/dL) Final  10/06/2007 1.20   Final  01/10/2007 11:24 PM 0.92  0.40-1.20 (mg/dL) Final  07/23/2006  8:31 PM 1.16  0.40-1.20 (mg/dL) Final    PMH:   Past Medical History  Diagnosis Date  . Crohn disease   . Hypertension   . Hyperlipidemia   . CHF (congestive heart failure)     PSH:   Past Surgical History  Procedure Date  . Cholecystectomy, laparoscopic 01/28/05  . 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 Salpingectomy     left    Allergies:  Allergies  Allergen Reactions  . Amoxicillin Anaphylaxis and Rash    REACTION:Throat Swelling  . Penicillins Anaphylaxis and Rash  . Ace Inhibitors Cough    ACE possibly associated with cough and switched to ARB.  Would be OK to re-challenge if needed.  . Gabapentin     Patient had one time seizure shortly after stopping gabapentin  . Aspirin Itching and Rash    REACTION: Rash only.  NO breathing issues with use.   Able to tolerate ASACOL    Medications:   Prior to Admission medications   Medication Sig Start Date End Date Taking? Authorizing Provider  acetaminophen (  TYLENOL) 500 MG tablet Take 1,000 mg by mouth at bedtime as needed. For pain   Yes Historical Provider, MD  calcium-vitamin D (OSCAL WITH D 500-200) 500-200 MG-UNIT per tablet Take 1 tablet by mouth 2 (two) times daily.    Yes Historical Provider, MD  citalopram (CELEXA) 10 MG tablet Take 10 mg by mouth daily. 07/12/11 07/11/12 Yes Garret Reddish, MD  isosorbide mononitrate (IMDUR) 30 MG 24 hr tablet Take 1 tablet (30 mg total) by mouth at bedtime. 06/10/11 06/09/12 Yes Zigmund Gottron, MD  LORazepam (ATIVAN) 0.5 MG tablet Take 1 tablet (0.5 mg total) by mouth 2 (two) times daily. For anxiety 06/10/11  Yes Zigmund Gottron, MD  mesalamine (ASACOL) 400 MG EC tablet Take 1,200 mg by mouth 2 (two) times  daily. 3 tabs twice daily   Yes Historical Provider, MD  metoprolol succinate (TOPROL-XL) 50 MG 24 hr tablet Take 50 mg by mouth daily. 07/15/11 07/14/12 Yes Zigmund Gottron, MD  omeprazole (PRILOSEC) 20 MG capsule  06/22/11  Yes Zigmund Gottron, MD  simvastatin (ZOCOR) 20 MG tablet  08/19/11  Yes Zigmund Gottron, MD  valsartan (DIOVAN) 80 MG tablet Take 80 mg by mouth daily. 07/15/11 07/14/12 Yes Zigmund Gottron, MD    Discontinued Meds:   Medications Discontinued During This Encounter  Medication Reason  . simvastatin (ZOCOR) 20 MG tablet   . metoprolol (TOPROL XL) 50 MG 24 hr tablet   . valsartan (DIOVAN) 80 MG tablet   . citalopram (CELEXA) 10 MG tablet   . omeprazole (PRILOSEC) 20 MG capsule   . ciprofloxacin (CIPRO) tablet 250 mg Dose change  . ciprofloxacin (CIPRO) 500 MG tablet Returned to ADS  . ciprofloxacin (CIPRO) tablet 500 mg   . metroNIDAZOLE (FLAGYL) tablet 500 mg     Social History:  reports that she quit smoking about 3 months ago. Her smoking use included Cigarettes. She has a 9 pack-year smoking history. She has never used smokeless tobacco. She reports that she does not drink alcohol or use illicit drugs.  Family History:  History reviewed. No pertinent family history.  Pertinent items are noted in HPI.  Blood pressure 86/58, pulse 96, temperature 98.2 F (36.8 C), temperature source Oral, resp. rate 18, SpO2 100.00%. General appearance: alert, cooperative and no distress Head: Normocephalic, without obvious abnormality, atraumatic, no icterus Neck: no adenopathy, no carotid bruit, no JVD, supple, symmetrical, trachea midline and thyroid not enlarged, symmetric, no tenderness/mass/nodules Resp: clear to auscultation bilaterally Cardio: regular rate and rhythm, S1, S2 normal, no murmur, click, rub or gallop GI: soft, non-tender; bowel sounds normal; no masses,  no organomegaly and ostomy at left Upper quadrant with gas and fluid in  bag Extremities: extremities normal, atraumatic, no cyanosis or edema  Labs: Basic Metabolic Panel:  Lab 07/37/10 0419 08/26/11 2301  NA 130* 129*  K 5.2* 5.0  CL 109 105  CO2 10* 11*  GLUCOSE 110* 100*  BUN 29* 28*  CREATININE 4.66* 4.72*  ALB -- --  CALCIUM 8.5 9.4  PHOS 4.6 --   Liver Function Tests:  Lab 08/27/11 0419 08/26/11 2301  AST 19 18  ALT 30 34  ALKPHOS 139* 155*  BILITOT 0.2* 0.3  PROT 7.2 8.2  ALBUMIN 3.4* 3.9   No results found for this basename: LIPASE:3,AMYLASE:3 in the last 168 hours No results found for this basename: AMMONIA:3 in the last 168 hours CBC:  Lab 08/27/11 0419 08/26/11 2301  WBC 7.4 8.3  NEUTROABS -- --  HGB 11.3* 12.3  HCT 34.1* 36.8  MCV 93.4 93.2  PLT 175 198   PT/INR: @labrcntip (inr:5) Cardiac Enzymes:  Lab 08/26/11 2302  CKTOTAL --  CKMB --  CKMBINDEX --  TROPONINI <0.30   CBG: No results found for this basename: GLUCAP:5 in the last 168 hours  Iron Studies: No results found for this basename: IRON:30,TIBC:30,TRANSFERRIN:30,FERRITIN:30 in the last 168 hours  Xrays/Other Studies: Ct Abdomen Pelvis Wo Contrast  08/27/2011  *RADIOLOGY REPORT*  Clinical Data: Right flank pain.  Elevated creatinine.  History of Crohn's disease.  Ostomy since 2009.  CT ABDOMEN AND PELVIS WITHOUT CONTRAST  Technique:  Multidetector CT imaging of the abdomen and pelvis was performed following the standard protocol without intravenous contrast.  Comparison: 10/21/2004.  Findings: Interval malrotation of the right kidney.  Minimal urine in the urinary bladder.  No bladder, ureteral or renal calculi seen.  No hydronephrosis.  The colon is surgically absent with the exception of the rectum and distal sigmoid colon.  A portion of the small bowel is also surgically absent.  The patient has a left anterior ileostomy. There is also herniated small bowel within a parastomal hernia.  No bowel dilatation or wall thickening is demonstrated.  Cholecystectomy  clips are noted.  Unremarkable noncontrasted appearance of the liver, spleen, pancreas and adrenal glands.  The uterus and ovaries are unremarkable.  No enlarged lymph nodes or free peritoneal fluid.  Small amount of linear density at both lung bases.  Unremarkable bones.  IMPRESSION:  1.  No acute abnormality. 2.  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 3.  Status post subtotal colectomy.  Original Report Authenticated By: Gerald Stabs, [name]     Assessment/Plan: 1.  AKI/CKD- presumably due to volume depletion and ischemic ATN +/- concomitate use of ARB.  Agree with volume resuscitation.  UOP has improved.  The interval malrotation of the right kidney will need to be re-evaluated with f/u imaging.  ?retroperitoneal hemorrhage? Or mass effect.  Consider urology eval.  No indication for HD at this time.  Do not resume ACE/ARB as risks outweigh benefits 2. Hyponatremia- due to hypovolemia as above.  Continue to follow after IVF's 3. Hyperkalemia due to #1.  Follow with increased UOP 4. Malrotation of right kidney- as above 5. Crohn's disease- cont with asacol and volume replacement 6. HTN- off BP meds due to vol depletion and hypotension 7.  Dispo- per primary svc.   Leonidas Boateng A 08/27/2011, 2:05 PM

## 2011-08-27 NOTE — ED Notes (Signed)
Pt requested and given grape juice. Ok'ed with RN.

## 2011-08-27 NOTE — ED Notes (Signed)
Pt ambulating to the restroom.

## 2011-08-27 NOTE — ED Notes (Signed)
Heat applied to rt flank

## 2011-08-27 NOTE — H&P (Signed)
[facility] Admission History and Physical  Patient name: Annalysse Shoemaker Medical record number: [id number] Date of birth: Mar 06, 1951 Age: 61 y.o. Gender: female  Primary Care Provider: Zigmund Gottron, MD, MD  Chief Complaint: Back pain  History of Present Illness: Vertis Bauder is a 61 y.o. year old female presenting with acute onset back pain. Pain started w/ mild tenderness in the perispinal muscle of the lower back at 13:00. Pt turned quickly at ~14:30 which caused acute worsening of the pain which is described as sharp and crampy, and is non radiating (pt relates pain to Charlie horses she's had in her legs in the past). Pain was somewhat relieved w/ rest adn tylenol but was again exacerbated when pt was watching grand children. Pt called EMS at that time and came to ED. Pt also reports one similar episode 5 days ago (saturday) which resolved spontaneously. No sick contacts. Pt also reports 5 days of diarrhea from her ostomy, w/ occasional chills, and lightheadedness. Has taken normal fluid intake but decreased nutritional intake.   Review Of Systems: Per HPI with the following additions:  Negative: HA, CP, SOB, nausea, vomiting, hematemesis, hematochezia, hematuria, dysuria, syncope, fever, rash Positive: decreased UOP.    Patient Active Problem List  Diagnoses  . HYPERCHOLESTEROLEMIA  . Quit smoking  . Anxiety  . NEUROPATHY, PERIPHERAL  . Reflux esophagitis  . CROHN'S DISEASE  . Hypertension, benign  . Seizure  . Systolic CHF, chronic  . Chronic kidney disease (CKD), stage III (moderate)  . Acute on chronic renal failure   Past Medical History: Past Medical History  Diagnosis Date  . Crohn disease   . Hypertension   . Hyperlipidemia   . CHF (systolic congestive heart failure)   Seizure disorder   Past Surgical History: Past Surgical History  Procedure Date  . Cholecystectomy, laparoscopic 01/28/05  . Colostomy s/p hemicolectomy 9/97   diverting  . Salpingectomy     left    Social History: History   Social History  . Marital Status: Single    Spouse Name: N/A    Number of Children: N/A  . Years of Education: N/A   Social History Main Topics  . Smoking status: Former Smoker -- 0.5 packs/day for 18 years    Types: Cigarettes    Quit date: 05/10/2011  . Smokeless tobacco: Never Used  . Alcohol Use: No  . Drug Use: No  . Sexually Active: Yes   Other Topics Concern  . Not on file   Social History Narrative  . No narrative on file    Family History: No family history on file.  Allergies: Allergies  Allergen Reactions  . Amoxicillin Anaphylaxis and Rash    REACTION:Throat Swelling  . Penicillins Anaphylaxis and Rash  . Ace Inhibitors Cough    ACE possibly associated with cough and switched to ARB.  Would be OK to re-challenge if needed.  . Gabapentin     Patient had one time seizure shortly after stopping gabapentin  . Aspirin Itching and Rash    REACTION: Rash only.  NO breathing issues with use.   Able to tolerate ASACOL    Current Facility-Administered Medications  Medication Dose Route Frequency Provider Last Rate Last Dose  . ciprofloxacin (CIPRO) IVPB 400 mg  400 mg Intravenous Once Linus Mako, PA   400 mg at 08/27/11 0402  . fentaNYL (SUBLIMAZE) injection 25 mcg  25 mcg Intravenous Once Linus Mako, PA   25 mcg at 08/26/11 2312  .  metroNIDAZOLE (FLAGYL) IVPB 500 mg  500 mg Intravenous Once Linus Mako, PA   500 mg at 08/27/11 0229  . ondansetron (ZOFRAN) injection 4 mg  4 mg Intravenous Once Linus Mako, PA   4 mg at 08/26/11 2312  . sodium chloride 0.9 % bolus 1,000 mL  1,000 mL Intravenous Once Linus Mako, PA   1,000 mL at 08/26/11 2355  . sodium chloride 0.9 % bolus 1,000 mL  1,000 mL Intravenous Once Linus Mako, PA   1,000 mL at 08/26/11 2245   Current Outpatient Prescriptions  Medication Sig Dispense Refill  . acetaminophen (TYLENOL) 500 MG tablet Take  1,000 mg by mouth at bedtime as needed. For pain      . calcium-vitamin D (OSCAL WITH D 500-200) 500-200 MG-UNIT per tablet Take 1 tablet by mouth 2 (two) times daily.       . citalopram (CELEXA) 10 MG tablet Take 10 mg by mouth daily.      . isosorbide mononitrate (IMDUR) 30 MG 24 hr tablet Take 1 tablet (30 mg total) by mouth at bedtime.  90 tablet  3  . LORazepam (ATIVAN) 0.5 MG tablet Take 1 tablet (0.5 mg total) by mouth 2 (two) times daily. For anxiety  60 tablet  5  . mesalamine (ASACOL) 400 MG EC tablet Take 1,200 mg by mouth 2 (two) times daily. 3 tabs twice daily      . metoprolol succinate (TOPROL-XL) 50 MG 24 hr tablet Take 50 mg by mouth daily.      Marland Kitchen omeprazole (PRILOSEC) 20 MG capsule       . simvastatin (ZOCOR) 20 MG tablet       . valsartan (DIOVAN) 80 MG tablet Take 80 mg by mouth daily.         Physical Exam: BP 94/66  Pulse 80  Temp(Src) 98.5 F (36.9 C) (Oral)  Resp 16  SpO2 100%  General: alert, cooperative, appears older than stated age and moderately obese HEENT: PERRLA, extra ocular movement intact, neck supple with midline trachea, thyroid without masses, trachea midline and small 1cm whitish lesion of the posterior R tongue base, small fleshy nodule of the R soft palate  Heart: S1, S2 normal, no murmur, rub or gallop, regular rate and rhythm Lungs: clear to auscultation, no wheezes or rales and unlabored breathing Abdomen: soft non-tender to palpation. Hypoactive bowel sounds, ostomy in place Extremities: extremities normal, atraumatic, no cyanosis or edema Musculoskeletal: Point tenderness to palpation of perispinal soft tissue of the R lower back. No spinal tenderness, no CVA tenderness. No appreciable muscle tightness in R lower back Skin:no rashes, no ecchymoses Neurology: normal without focal findings, mental status, speech normal, alert and oriented x3 and PERLA  Labs and Imaging: Lab Results  Component Value Date/Time   NA 129* 08/26/2011 11:01 PM    K 5.0 08/26/2011 11:01 PM   CL 105 08/26/2011 11:01 PM   CO2 11* 08/26/2011 11:01 PM   BUN 28* 08/26/2011 11:01 PM   CREATININE 4.72* 08/26/2011 11:01 PM   CREATININE 1.95* 07/29/2011  4:33 PM   GLUCOSE 100* 08/26/2011 11:01 PM   Lab Results  Component Value Date   WBC 7.4 08/27/2011   HGB 11.3* 08/27/2011   HCT 34.1* 08/27/2011   MCV 93.4 08/27/2011   PLT 175 08/27/2011   Lactic Acid, Venous: 0.6  UA: Cloudy appearance, 100 protein, moderate Hgb, Many bacteria, Ca Oxalate crystals, Granular casts UCX: Pending  ProBNP: 207  EKG: normal  CT Abdomen:  Findings: Interval malrotation of the right kidney. Minimal urine in the urinary bladder. No bladder, ureteral or renal calculi  seen. No hydronephrosis.  The colon is surgically absent with the exception of the rectum and distal sigmoid colon. A portion of the small bowel is also surgically absent. The patient has a left anterior ileostomy. There is also herniated small bowel within a parastomal hernia. No bowel dilatation or wall thickening is demonstrated.   Cholecystectomy clips are noted. Unremarkable noncontrasted appearance of the liver, spleen, pancreas and adrenal glands. The uterus and ovaries are unremarkable. No enlarged lymph nodes or free peritoneal fluid. Small amount of linear density at both lung bases. Unremarkable bones.  IMPRESSION:  1. No acute abnormality.  2. 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  3. Status post subtotal colectomy.   Assessment and Plan: Aamori Mcmasters is a 61 y.o. year old female presenting with Acute on Chronic Kidney failure and acute onset back pain  1. Back pain: musculoskeletal/muscle spasm vs nerve impingement vs fracture vs kidney stone vs Crohns flair vs shingles. CT negative for fracture and stones though kidney rotated 90 degrees from previous scan. Spoke to Radiology who said no sign of avascular necrosis of the R kidney.  - +/- trigger point injection - PT  -  Tylenol - heat pad  2. Acute on Chronic Kidney Failure: Likely secondary to worsening kidney disease and dehydration resulting in hypoperfusion. Mild improvement in Kidney function after 3L NS. Spoke to Renal who will see pt today. Malrotation noted on CT but no sign of avascular necrosis,  - Hydration w/ 1L NS bolus and maintanence of D5 w/ 144mq @ 1037mhr - Renal consult - f/u UCX  3. Acidosis: Likely seondary to kidney failure. Lactic acid normal and afebrile w/o white count, so infection less likely. Will likely improve w/ Bicarb and return of renal function - Fluids as above - Renal panel this afternoon  3. GI: 5 day h/o diarrhea w/o other infectious symptoms worriesome for Crohns flair. Also possible viral gastroenteritis.  - Continue Cipro and Flagyl - Continue home Asacol - +/- Prednisone (will discuss with care team)  4. CHF: Systolic heart failure. Pro BNP 207. EKG normal. Troponin normal. No complaints or symptoms of exacerbation at this time. Will monitor closely due to significant fluid resuscitation. Holding home BP meds due to hypotension.  - Daily weights - strict I/O - Foley Catheter   2. FEN: Hyperkalemia likely secondary to renal failure, Sodium improving to 130 w/ hydration of NS.  - Renal diet - Fluids as above  3. Prophylaxis:  - Heparin Lehr TID - Protonix  4. Disposition: Pending clinical improvement    Signed: MERRELL, DAVID, [name] Family Medicine Resident PGY-1 08/27/2011 4:50 AM  R3 addendum:  HPI- pt p/w intermittent sharp pain on the right side of her lower back. She had one episode of several minutes of Saturday that resolved and then the pain reoccurred at 1pm and 2:30 today.  This pain felt like a muscle spasm to the pt.  In between her episodes she was able to sleep comfortably.  She had another episode in the evening and she called EMS.  Pt denies weakness in her legs, she has been up and walking since coming to the ED.  She says that pain does  not radiate.  It seems to come on with twisting motions.   Pt notes that she has had watery diarrhea from her ileostomy since Saturday.  She  usually has thick stool but it is now completely liquid.  She denies fevers, abdominal pain.  Denies sick contacts.  Denies N/V.    PE: Gen- alert, NAD HEENT-mmm, white ulcer present in mouth on tongue Neck- supple, no lymphadenopathy CV- RRR no murmur Lungs- CTAB, no crackles or wheeze ABD- ileostomy bag with liquid brown stool on left with pink stoma.  Abd soft and NT, ND.  Back- TTP 3cm from L2 on right in a small area without evidence of spasm EXT-no LE edema, warm  A/P  61 yo black F presenting with back pain, diarrhea and A on CKD 1. Back pain- differential includes kidney stone, pyelonephritis, AAA, compression fracture, nerve impingement. On CT, no kidney stone, AAA, or compression fracture. U/A does not show evidence of infection with no LE or nitrate.  Will continue to monitor, symptomatic treatment for now. 2. Diarrhea- pt with crohn's disease and 5 days of diarrhea without N/V/fevers.  Potentially crohn's flare vs infectious etiology.  Will continue cipro/flagyl, hydrate.  May consider GI consult if necessary for management.   3. A on CKD- likely due to dehydration from her diarrhea.  She received 3 L bolus in the ED.  We will continue to hydrate her gently to keep up with her output.  On consultation with renal, we will start D5W with 3 amps of bicarb at 108m/hr. we will follow her I and Os with a foley.  Renal will consult on this pt.   4. CHF- last ECHO showed EF of 30-35%.  BNP negative.  Continue to monitor on tele, pulse ox.  Still needs more fluids due to soft BP.  Monitor fluid status carefully. 5. PPX- heparin and omeprazole 6. Dispo- pending clinical improvement. RMarlana Salvage

## 2011-08-27 NOTE — ED Notes (Signed)
[id number] Ready

## 2011-08-27 NOTE — ED Notes (Signed)
Pt was very nervous when being catheterized, stated, " I do not like stuff like this!" Obtained very small amount of cloudy pale yellow urine.

## 2011-08-27 NOTE — Progress Notes (Signed)
ANTIBIOTIC CONSULT NOTE - INITIAL  Pharmacy Consult for Cipro Indication: GI/UTI infectious process vs Crohn's flare  Allergies  Allergen Reactions  . Amoxicillin Anaphylaxis and Rash    REACTION:Throat Swelling  . Penicillins Anaphylaxis and Rash  . Ace Inhibitors Cough    ACE possibly associated with cough and switched to ARB.  Would be OK to re-challenge if needed.  . Gabapentin     Patient had one time seizure shortly after stopping gabapentin  . Aspirin Itching and Rash    REACTION: Rash only.  NO breathing issues with use.   Able to tolerate ASACOL    Patient Measurements:  Wt: 69.6 kg  Vital Signs: Temp: 98.2 F (36.8 C) (02/21 0527) Temp src: Oral (02/21 0527) BP: 95/60 mmHg (02/21 0730) Pulse Rate: 94  (02/21 0730) Intake/Output from previous day:   Intake/Output from this shift:    Labs:  Basename 08/27/11 0419 08/26/11 2301  WBC 7.4 8.3  HGB 11.3* 12.3  PLT 175 198  LABCREA -- --  CREATININE 4.66* 4.72*   The CrCl is unknown because both a height and weight (above a minimum accepted value) are required for this calculation. No results found for this basename: VANCOTROUGH:2,VANCOPEAK:2,VANCORANDOM:2,GENTTROUGH:2,GENTPEAK:2,GENTRANDOM:2,TOBRATROUGH:2,TOBRAPEAK:2,TOBRARND:2,AMIKACINPEAK:2,AMIKACINTROU:2,AMIKACIN:2, in the last 72 hours   Microbiology: No results found for this or any previous visit (from the past 720 hour(s)).  Medical History: Past Medical History  Diagnosis Date  . Crohn disease   . Hypertension   . Hyperlipidemia   . CHF (congestive heart failure)     Medications:  See electronic Med Rec  Assessment: 60yof admitted for back pain and diarrhea to start empiric Cipro for possible Crohn's flare vs GI/UTI infectious process. Pt is afebrile and WBC wnl. Pt received Cipro 442m IV in ED (~ 0400). Pt is tolerating oral meds. - SCr 4.66, CrCl ~13 ml/min - Pt has PCN allergy (anaphylaxis)  Goal of Therapy:  Clinical  improvement  Plan:  1. Cipro 5059mpo q24h - next dose @ 2200 tonight 2. Monitor renal function, cultures, clinical course and adjust dose as indicated  DuEarleen [id number]/21/2013,8:40 AM

## 2011-08-27 NOTE — ED Notes (Signed)
MD at bedside. 

## 2011-08-27 NOTE — ED Provider Notes (Signed)
Evaluation and management procedures were performed by the PA/NP under my supervision/collaboration.    Charlena Cross, MD 08/27/11 [phone]

## 2011-08-27 NOTE — ED Notes (Signed)
BP and critical CO2 reported to Dr. Ferne Coe with family practice

## 2011-08-27 NOTE — Progress Notes (Signed)
Patient discussed with the resident on the phone. Patient has seen my partner Dr. Amedeo Plenty in the past and has a history of Crohn's disease status post surgery and ileostomy and has had increased diarrhea for 5 days. However her CT scan did not show any signs of Crohn's said the medical team will check stool studies and reevaluate medicines or recent antibiotic use and let us know if we can help further in the future particularly if diarrhea continues

## 2011-08-27 NOTE — ED Provider Notes (Addendum)
History     CSN: [id number]  Arrival date & time 08/26/11  2217   First MD Initiated Contact with Patient 08/26/11 2304      Chief Complaint  Patient presents with  . Flank Pain    (Consider location/radiation/quality/duration/timing/severity/associated sxs/prior treatment) HPI  Pt presents to the ED by EMS wiith complaints of right sided flank pain. The EMS measured her BP at 85/54, after rechecked her BP was 95/64. The states that she has been drinking a lot of water but has not had much of an appetite. She has a history of renal failure and heart failure. The pt has a colectomy bag.   61 yo female in blue room 2, has been seen by my attending Dr. Kae Heller and myself. The patients PMH is significant for systolic CHF,  acute on chronic renal failure, hypertension,  crohns disease and is s/p subtotal  Colectomy.  Pt presented to the ED by EMS with severe right flank pain that onset at 1pm without N,V,D or fevers. Her blood pressure in the ED has stayed low despite 3 L of fluids at 96/64.   On exam the patient is tender to the back and has a positive Murphys sign. Her cbc is normal, her CMP shows hyponatremia at 129, CO2 of 11, BUN 28, Creatinine of 4.72 and her GFR is 9. Ontop of that it appears she has a UTI with many bacteria in her urine.  Pt has had a cholecystectomy.    Past Medical History  Diagnosis Date  . Crohn disease   . Hypertension   . Hyperlipidemia   . CHF (congestive heart failure)     Past Surgical History  Procedure Date  . Cholecystectomy, laparoscopic 01/28/05  . 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 Salpingectomy     left    No family history on file.  History  Substance Use Topics  . Smoking status: Former Smoker -- 0.5 packs/day for 18 years    Types: Cigarettes    Quit date: 05/10/2011  . Smokeless tobacco: Never Used  . Alcohol Use: No    OB History    Grav Para Term Preterm Abortions TAB SAB Ect Mult Living                  Review of  Systems  All other systems reviewed and are negative.    Allergies  Amoxicillin; Penicillins; Ace inhibitors; Gabapentin; and Aspirin  Home Medications   Current Outpatient Rx  Name Route Sig Dispense Refill  . ACETAMINOPHEN 500 MG PO TABS Oral Take 1,000 mg by mouth at bedtime as needed. For pain    . CALCIUM CARBONATE-VITAMIN D 500-200 MG-UNIT PO TABS Oral Take 1 tablet by mouth 2 (two) times daily.     Marland Kitchen CITALOPRAM HYDROBROMIDE 10 MG PO TABS Oral Take 10 mg by mouth daily.    . ISOSORBIDE MONONITRATE ER 30 MG PO TB24 Oral Take 1 tablet (30 mg total) by mouth at bedtime. 90 tablet 3  . LORAZEPAM 0.5 MG PO TABS Oral Take 1 tablet (0.5 mg total) by mouth 2 (two) times daily. For anxiety 60 tablet 5  . MESALAMINE 400 MG PO TBEC Oral Take 1,200 mg by mouth 2 (two) times daily. 3 tabs twice daily    . METOPROLOL SUCCINATE ER 50 MG PO TB24 Oral Take 50 mg by mouth daily.    Marland Kitchen OMEPRAZOLE 20 MG PO CPDR      . SIMVASTATIN 20 MG PO TABS      .  VALSARTAN 80 MG PO TABS Oral Take 80 mg by mouth daily.      BP 95/64  Pulse 83  Temp(Src) 98.5 F (36.9 C) (Oral)  Resp 16  SpO2 93%  Physical Exam  Nursing note and vitals reviewed. Constitutional: She appears well-developed and well-nourished.  HENT:  Head: Normocephalic and atraumatic.  Eyes: Pupils are equal, round, and reactive to light.  Neck: Trachea normal, normal range of motion and full passive range of motion without pain. Neck supple.  Cardiovascular: Normal rate, regular rhythm and normal pulses.   Pulmonary/Chest: Effort normal and breath sounds normal. She has no wheezes. Chest wall is not dull to percussion. She exhibits no tenderness, no crepitus, no edema, no deformity and no retraction.  Abdominal: Soft. Normal appearance and bowel sounds are normal. She exhibits no distension and no mass. There is tenderness (RUQ). There is no rebound.  Musculoskeletal: Normal range of motion.  Neurological: She is alert. She has normal  strength.  Skin: Skin is warm, dry and intact.  Psychiatric: Her speech is normal. Cognition and memory are normal.    ED Course  Procedures (including critical care time)  Labs Reviewed  URINALYSIS, ROUTINE W REFLEX MICROSCOPIC - Abnormal; Notable for the following:    APPearance CLOUDY (*)    Hgb urine dipstick MODERATE (*)    Protein, ur 100 (*)    All other components within normal limits  COMPREHENSIVE METABOLIC PANEL - Abnormal; Notable for the following:    Sodium 129 (*)    CO2 11 (*)    Glucose, Bld 100 (*)    BUN 28 (*)    Creatinine, Ser 4.72 (*)    Alkaline Phosphatase 155 (*)    GFR calc non Af Amer 9 (*)    GFR calc Af Amer 11 (*)    All other components within normal limits  URINE MICROSCOPIC-ADD ON - Abnormal; Notable for the following:    Bacteria, UA MANY (*)    Casts GRANULAR CAST (*)    Crystals CA OXALATE CRYSTALS (*)    All other components within normal limits  CBC  TROPONIN I  URINE CULTURE   Ct Abdomen Pelvis Wo Contrast  08/27/2011  *RADIOLOGY REPORT*  Clinical Data: Right flank pain.  Elevated creatinine.  History of Crohn's disease.  Ostomy since 2009.  CT ABDOMEN AND PELVIS WITHOUT CONTRAST  Technique:  Multidetector CT imaging of the abdomen and pelvis was performed following the standard protocol without intravenous contrast.  Comparison: 10/21/2004.  Findings: Interval malrotation of the right kidney.  Minimal urine in the urinary bladder.  No bladder, ureteral or renal calculi seen.  No hydronephrosis.  The colon is surgically absent with the exception of the rectum and distal sigmoid colon.  A portion of the small bowel is also surgically absent.  The patient has a left anterior ileostomy. There is also herniated small bowel within a parastomal hernia.  No bowel dilatation or wall thickening is demonstrated.  Cholecystectomy clips are noted.  Unremarkable noncontrasted appearance of the liver, spleen, pancreas and adrenal glands.  The uterus and  ovaries are unremarkable.  No enlarged lymph nodes or free peritoneal fluid.  Small amount of linear density at both lung bases.  Unremarkable bones.  IMPRESSION:  1.  No acute abnormality. 2.  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 3.  Status post subtotal colectomy.  Original Report Authenticated By: Gerald Stabs, [name]     1. Abdominal pain   2. Acute  on chronic renal failure       MDM  Pt admitted to [facility] practice, Medsurg to Dr. Clarise Cruz neal. ]  Pt was examined prior to discharge by Dr. Kae Heller.  Patient has been resting comfortably in ED since receiving her pain medication on initial evaluation.      Date: 08/27/2011  Rate: 77  Rhythm: normal sinus rhythm  QRS Axis: normal  Intervals: normal  ST/T Wave abnormalities: normal  Conduction Disutrbances:none  Narrative Interpretation:   Old EKG Reviewed: unchanged from Jul 12, 2011     Linus Mako, [place] 08/27/11 [place], [place] 08/27/11 [phone]

## 2011-08-27 NOTE — ED Notes (Signed)
Informed patient and/or family of status. Awaiting bed assignment. No voiced complaints presently. NAD.

## 2011-08-27 NOTE — Progress Notes (Signed)
Patient ID: [name] [name], female   DOB: March 18, 1951, 61 y.o.   MRN: [id number]  Patient has been waiting for Step Down Bed since admission about 12 hours ago.  Will admit to telemetry instead.  RN called about HR in 120s, but per patient, this is her baseline HR.  Continue IVF per Renal.  It is okay to transfer patient to telemetry bed with current tachycardia.  Will re-assess when patient is on the floor.  DE LA CRUZ,Nirav Sweda

## 2011-08-27 NOTE — H&P (Signed)
FMTS Attending Admission Note: [name] MD [phone] pager office [phone] I  have seen and examined this patient, reviewed their chart. I have discussed this patient with the resident. I agree with the resident's findings, assessment and care plan. Additionally, I think it prudent to go ahead with renal and a GI consult. Also will get renal US this am to further eval right kidney---unusual to have kidney rotation without some retroperitoneal mass which I do not see on CT scan. Will assure blood flow to kidney with [place]. She is comfortable at this time. Intravascularly depleted secondary to diarrhea through ileostomy with assoc electrolyte disturbances. Will replete.

## 2011-08-27 NOTE — ED Provider Notes (Signed)
Evaluation and management procedures were performed by the PA/NP under my supervision/collaboration.     Charlena Cross, MD 08/27/11 [phone]

## 2011-08-28 DIAGNOSIS — N19 Unspecified kidney failure: Secondary | ICD-10-CM

## 2011-08-28 LAB — RENAL FUNCTION PANEL
CO2: 21 mEq/L (ref 19–32)
GFR calc Af Amer: 14 mL/min — ABNORMAL LOW (ref >90)
Glucose, Bld: 99 mg/dL (ref 70–99)
Phosphorus: 3 mg/dL (ref 2.3–4.6)
Potassium: 2.8 mEq/L — ABNORMAL LOW (ref 3.5–5.1)
Sodium: 138 mEq/L (ref 135–145)

## 2011-08-28 LAB — CBC
HCT: 29.1 % — ABNORMAL LOW (ref 36.0–46.0)
Hemoglobin: 10.1 g/dL — ABNORMAL LOW (ref 12.0–15.0)
RDW: 13.8 % (ref 11.5–15.5)
WBC: 5.4 10*3/uL (ref 4.0–10.5)

## 2011-08-28 LAB — GIARDIA/CRYPTOSPORIDIUM SCREEN(EIA): Cryptosporidium Screen (EIA): NEGATIVE

## 2011-08-28 MED ORDER — LOPERAMIDE HCL 2 MG PO CAPS: 2.0000 mg | ORAL_CAPSULE | ORAL | Status: DC | PRN

## 2011-08-28 MED ORDER — POTASSIUM CHLORIDE CRYS ER 20 MEQ PO TBCR
EXTENDED_RELEASE_TABLET | ORAL | Status: AC
  Filled 2011-08-28: qty 2

## 2011-08-28 MED ORDER — LOPERAMIDE HCL 2 MG PO CAPS
4.0000 mg | ORAL_CAPSULE | Freq: Once | ORAL | Status: AC
  Administered 2011-08-28: 4 mg via ORAL
  Filled 2011-08-28: qty 2

## 2011-08-28 MED ORDER — SODIUM CHLORIDE 0.9 % IV SOLN
INTRAVENOUS | Status: DC
  Administered 2011-08-28 – 2011-08-30 (×2): via INTRAVENOUS

## 2011-08-28 MED ORDER — LORAZEPAM 0.5 MG PO TABS
0.5000 mg | ORAL_TABLET | Freq: Once | ORAL | Status: AC
  Administered 2011-08-28: 0.5 mg via ORAL
  Filled 2011-08-28: qty 1

## 2011-08-28 MED ORDER — POTASSIUM CHLORIDE CRYS ER 20 MEQ PO TBCR
40.0000 meq | EXTENDED_RELEASE_TABLET | Freq: Two times a day (BID) | ORAL | Status: AC
  Administered 2011-08-28 – 2011-08-29 (×4): 40 meq via ORAL
  Filled 2011-08-28 (×3): qty 2

## 2011-08-28 NOTE — Progress Notes (Signed)
*  PRELIMINARY RESULTS* Vascular Ultrasound Renal Artery Duplex has been completed.  Preliminary findings:  Right= No evidence of renal artery stenosis. RAR= 1.33. Left= 60% stenosis of the proximal and mid segments. 80% stenosis of the distal segment. RAR= 5.13.  Bilateral normal intrarenal RI.  Right Kidney= 11.5cm. Left Kidney= 10.6cm. Bilateral kidneys appear hyperechoic with dilated medullary pyramids.   Aorta: Proximal 2.33cm, Mid 1.69cm, Distal 1.59cm.  Landry Mellow RDMS 08/28/2011, 3:27 PM

## 2011-08-28 NOTE — Progress Notes (Signed)
Patient experienced some SOB, increased HR to low 100's around 3:00 AM. Patient explained that she felt as though she was having a panic attack. She explained that she has had one before, and patient does have a history of anxiety. Patient explained that she does take Ativan at home. Home medications reviewed and 0.5 mg of Ativan is present on her home medication list.  Vitals T 99.1 P 103 R 22 BP 121/78 02 100 on RA. Dr. Cori Razor Maryjean Morn was notified, and a one time dose of Ativan 0.5 mg PO was ordered and administered. Patient now quietly resting, will continue to monitor.

## 2011-08-28 NOTE — Progress Notes (Signed)
  Subjective:    Patient ID: [name] [name], female    DOB: 11-02-1950, 61 y.o.   MRN: [id number]  HPI Comments: Seen and examined.  I am both FM attending and primary MD.  Agree with and appreciate great care of team and renal consultant.  Has acute on chronic renal failure presumed 2nd to dehydration from diarrhea.  Creat improving with hydration and holding ARB.  She has known systolic dysfunction CHF so we need to watch I&O and weights carefully.  Denies OTC NSAIDs prior to admit.  Flank Pain      Review of Systems  Genitourinary: Positive for flank pain.       Objective:   Physical Exam        Assessment & Plan:

## 2011-08-28 NOTE — Progress Notes (Signed)
08-28-11 UR completed.  Magdalen Spatz RN BSN

## 2011-08-28 NOTE — Progress Notes (Signed)
[facility] Progress Note  Patient name: [name] [name] Medical record number: [id number] Date of birth: 03/12/1951 Age: 61 y.o. Gender: female    LOS: 2 days   Primary Care Provider: Zigmund Gottron, MD, MD  Overnight Events:  NAEO. Difficulty sleeping due to anxiousness, so given home Ativan 0.67m x1 and slept well after that time. Back pain much improved this morning after treatment w/ tylenol and heat pads. Tolerating renal diet w/o n/v. Continues to have diarrhea out from her ostomy. Continues to feel weak this am.   Objective: Vital signs in last 24 hours: Temp:  [99.1 F (37.3 C)-99.4 F (37.4 C)] 99.1 F (37.3 C) (02/22 0353) Pulse Rate:  [86-103] 103  (02/22 0353) Resp:  [14-25] 22  (02/22 0353) BP: (79-121)/(52-81) 121/78 mmHg (02/22 0353) SpO2:  [97 %-100 %] 100 % (02/22 0353) Weight:  [154 lb 9.6 oz (70.126 kg)-155 lb (70.308 kg)] 155 lb (70.308 kg) (02/22 0353)  Wt Readings from Last 3 Encounters:  08/28/11 155 lb (70.308 kg)  07/29/11 153 lb 8 oz (69.627 kg)  07/15/11 154 lb (69.854 kg)     Current Facility-Administered Medications  Medication Dose Route Frequency Provider Last Rate Last Dose  . acetaminophen (TYLENOL) tablet 650 mg  650 mg Oral Q6H PRN DLinna Darner MD   650 mg at 08/28/11 0416   Or  . acetaminophen (TYLENOL) suppository 650 mg  650 mg Rectal Q6H PRN DLinna Darner MD      . citalopram (CELEXA) tablet 10 mg  10 mg Oral Daily DLinna Darner MD   10 mg at 08/27/11 1136  . fentaNYL (SUBLIMAZE) injection 25 mcg  25 mcg Intravenous Once IDonnamarie Rossetti MD   25 mcg at 08/27/11 1418  . heparin injection 5,000 Units  5,000 Units Subcutaneous Q8H DLinna Darner MD   5,000 Units at 08/28/11 [phone] . LORazepam (ATIVAN) tablet 0.5 mg  0.5 mg Oral Once IDonnamarie Rossetti MD   0.5 mg at 08/28/11 0410  . mesalamine (ASACOL) EC tablet 1,200 mg  1,200 mg Oral BID DLinna Darner MD   1,200 mg at 08/27/11 2247  . pantoprazole  (PROTONIX) EC tablet 40 mg  40 mg Oral Q1200 DLinna Darner MD   40 mg at 08/27/11 1701  . simvastatin (ZOCOR) tablet 20 mg  20 mg Oral q1800 DLinna Darner MD   20 mg at 08/27/11 1740  . sodium bicarbonate 150 mEq in dextrose 5 % 1,000 mL infusion   Intravenous Continuous DLinna Darner MD 100 mL/hr at 08/27/11 1725    . DISCONTD: ciprofloxacin (CIPRO) 500 MG tablet           . DISCONTD: ciprofloxacin (CIPRO) tablet 250 mg  250 mg Oral BID DLinna Darner MD      . DISCONTD: ciprofloxacin (CIPRO) tablet 500 mg  500 mg Oral QHS WPatsey BertholdDEdgewood PHARMD      . DISCONTD: metroNIDAZOLE (FLAGYL) tablet 500 mg  500 mg Oral Q8H DLinna Darner MD   500 mg at 08/27/11 0817     PE: Gen:NAD HEENT: MMM CV: RRR, no m/r/g Res: CTAB, poor respiratory effort Abd: Soft non-tender Ext/Musc: 2+ pulses throughout. No edema Neuro:CN grossly intact. Intact gross motor skils.   Labs/Studies:  Basic Metabolic Panel:    Component Value Date/Time   NA 138 08/28/2011 0915   K 2.8* 08/28/2011 0915   CL 104 08/28/2011 0915   CO2 21 08/28/2011 0915   BUN 29* 08/28/2011 0915  CREATININE 3.85* 08/28/2011 0915   CREATININE 1.95* 07/29/2011 1633   GLUCOSE 99 08/28/2011 0915   CALCIUM 7.9* 08/28/2011 0915    Assessment/Plan: [name] [name] is a 62 y.o. year old female presenting with Acute on Chronic Kidney failure and acute onset back pain   1. Back pain: Much improved this morning after tylenol and heat pads. Normal ROM w/o pain complaints musculoskeletal/muscle spasm vs nerve impingement vs renal infarct from malrotation. CT negative for fracture and stones though kidney rotated 90 degrees from previous scan.  - Rnal vascular study  today - PT - Tylenol  - heat pad   2. Acute on Chronic Kidney Failure: Nephrology following. Improving Cr to 3.85 today. Initial bump likely secondary to worsening kidney disease and dehydration resulting in ATN from hypoperfusion. Vascular study today to evaluate for ishcemia,  especially in the R artery. I/O not recorded over the past day. Spoke to nurse who will update chart.  - DC D5 w/ 129mq @ 1070mhr  - Start NS @ 10037mr (will change when output accurately obtained) - Renal consult  - f/u UCX   3. Acidosis: Resolved.  Likely seondary to kidney failure. Bicarb 21 today. Lactic acid normal and afebrile w/o white count, so infection less likely. Will likely stay normal as kidney function returns to normal  - Fluids as above  - BMET Tomorrow  4. Crohn's: No active flare. GI following   - Continue home Asacol    5. CHF: Systolic heart failure. On admission Pro BNP 207, EKG, and Troponin normal. No complaints or symptoms of exacerbation at this time. No signs or symptoms of pulmonary congestion. Will monitor closely due to continued fluid resuscitation. BP trending up but will continue to hold off on home BP meds.   - Daily weights  - strict I/O   6. FEN/GI: Hyperkalemia adn hyponatremia resolved. Bicarb normal. No hypokalemic. Hypocalcemia actually normal when corrected due to low albumin. No active Crohn's flare. Continues to have diarrhea. Ouptut not accurately recorded. Does not appear to be infectious though stool studies pending.  - Renal diet  - Fluids as above  - BMET tomorrow - Imodium PRN - f/u stool cx  7. Prophylaxis:  - Heparin [place] TID  - Protonix   8. Disposition: Pending clinical improvement   Signed: MERLinna DarnerD Family Medicine Resident PGY-1 [phone]/2013 8:35 AM

## 2011-08-28 NOTE — Progress Notes (Signed)
Subjective: Interval History: none.  Objective: Vital signs in last 24 hours: Temp:  [99.1 F (37.3 C)-99.4 F (37.4 C)] 99.1 F (37.3 C) (02/22 0353) Pulse Rate:  [88-103] 103  (02/22 0353) Resp:  [14-25] 22  (02/22 0353) BP: (80-121)/(53-78) 121/78 mmHg (02/22 0353) SpO2:  [99 %-100 %] 100 % (02/22 0353) Weight:  [70.126 kg (154 lb 9.6 oz)-70.308 kg (155 lb)] 70.308 kg (155 lb) (02/22 0353) Weight change:   Intake/Output from previous day: 02/21 0701 - 02/22 0700 In: 1391 [P.O.:40; I.V.:1351] Out: 1 [Stool:1] Intake/Output this shift: Total I/O In: -  Out: 500 [Urine:500]  General appearance: alert, cooperative and no distress Resp: clear to auscultation bilaterally Cardio: regular rate and rhythm and S1, S2 normal GI: ostomy upper abdm, pos bs, soft. Extremities: extremities normal, atraumatic, no cyanosis or edema  Lab Results:  Basename 08/28/11 0915 08/27/11 2135  WBC 5.4 5.7  HGB 10.1* 10.4*  HCT 29.1* 30.7*  PLT 163 160   BMET:  Basename 08/28/11 0915 08/27/11 2135 08/27/11 1708  NA 138 -- 136  K 2.8* -- 3.6  CL 104 -- 108  CO2 21 -- 15*  GLUCOSE 99 -- 94  BUN 29* -- 30*  CREATININE 3.85* 4.44* --  CALCIUM 7.9* -- 8.3*   No results found for this basename: PTH:2 in the last 72 hours Iron Studies: No results found for this basename: IRON,TIBC,TRANSFERRIN,FERRITIN in the last 72 hours  Studies/Results: Ct Abdomen Pelvis Wo Contrast  08/27/2011  *RADIOLOGY REPORT*  Clinical Data: Right flank pain.  Elevated creatinine.  History of Crohn's disease.  Ostomy since 2009.  CT ABDOMEN AND PELVIS WITHOUT CONTRAST  Technique:  Multidetector CT imaging of the abdomen and pelvis was performed following the standard protocol without intravenous contrast.  Comparison: 10/21/2004.  Findings: Interval malrotation of the right kidney.  Minimal urine in the urinary bladder.  No bladder, ureteral or renal calculi seen.  No hydronephrosis.  The colon is surgically absent  with the exception of the rectum and distal sigmoid colon.  A portion of the small bowel is also surgically absent.  The patient has a left anterior ileostomy. There is also herniated small bowel within a parastomal hernia.  No bowel dilatation or wall thickening is demonstrated.  Cholecystectomy clips are noted.  Unremarkable noncontrasted appearance of the liver, spleen, pancreas and adrenal glands.  The uterus and ovaries are unremarkable.  No enlarged lymph nodes or free peritoneal fluid.  Small amount of linear density at both lung bases.  Unremarkable bones.  IMPRESSION:  1.  No acute abnormality. 2.  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 3.  Status post subtotal colectomy.  Original Report Authenticated By: Gerald Stabs, [name]    I have reviewed the patient's current medications.  Assessment/Plan: 1 CKD/AKI related to vol depletion in setting of ARB. Improving.  Acid base better and bp better.  Would avoid angiotensin active agent in this setting of variable volume status. 2 Anemia 3 Crohns   P Cont ivf, change to NS in am, avoid angiotensin active agents.    LOS: 2 days   Analis Distler L 08/28/2011,11:21 AM

## 2011-08-28 NOTE — Evaluation (Signed)
Physical Therapy Evaluation Patient Details Name: [name] [name] MRN: [id number] DOB: [birth year] Today's Date: 08/28/2011  Problem List:  Patient Active Problem List  Diagnoses  . HYPERCHOLESTEROLEMIA  . Quit smoking  . Anxiety  . NEUROPATHY, PERIPHERAL  . Reflux esophagitis  . CROHN'S DISEASE  . Hypertension, benign  . Seizure  . Systolic CHF, chronic  . Chronic kidney disease (CKD), stage III (moderate)  . Acute on chronic renal failure  . Back pain  . Diarrhea    Past Medical History:  Past Medical History  Diagnosis Date  . Crohn disease   . Hypertension   . Hyperlipidemia   . CHF (congestive heart failure)    Past Surgical History:  Past Surgical History  Procedure Date  . Cholecystectomy, laparoscopic 01/28/05  . 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 Salpingectomy     left    PT Assessment/Plan/Recommendation PT Assessment Clinical Impression Statement: pt adm with back pain and diarrhea. Due to diarrhea she was dehydrated putting her into renal failure.  Presently pt is weak and has decr. activity tolerance.  Recommend HHPT. PT Recommendation/Assessment: Patient will need skilled PT in the acute care venue PT Problem List: Decreased strength;Decreased activity tolerance;Decreased balance;Decreased mobility PT Therapy Diagnosis : Generalized weakness PT Plan PT Frequency: Min 3X/week PT Recommendation Follow Up Recommendations: Home health PT Equipment Recommended: None recommended by PT PT Goals  Acute Rehab PT Goals PT Goal Formulation: With patient Time For Goal Achievement: 7 days Pt will go Supine/Side to Sit: Independently PT Goal: Supine/Side to Sit - Progress: Goal set today Pt will go Sit to Stand: Independently PT Goal: Sit to Stand - Progress: Goal set today Pt will Transfer Bed to Chair/Chair to Bed: Independently PT Transfer Goal: Bed to Chair/Chair to Bed - Progress: Goal set today Pt will Ambulate: >150 feet;with modified independence;with least  restrictive assistive device PT Goal: Ambulate - Progress: Goal set today Pt will Go Up / Down Stairs: 3-5 stairs;with modified independence;with rail(s) PT Goal: Up/Down Stairs - Progress: Goal set today  PT Evaluation Precautions/Restrictions  Precautions Precautions: Fall Restrictions Weight Bearing Restrictions: No Prior Functioning  Home Living Lives With: Significant other;Other (Comment) (grand child) Receives Help From: Family Type of Home: House Home Layout: One level;Able to live on main level with bedroom/bathroom Home Access: Stairs to enter Entrance Stairs-Rails: Right;Left Entrance Stairs-Number of Steps: 4 Bathroom Shower/Tub: Multimedia programmer: Standard Home Adaptive Equipment: Walker - rolling Prior Function Level of Independence: Independent with basic ADLs;Independent with homemaking with ambulation;Independent with transfers;Independent with gait Able to Take Stairs?: Yes Driving: Yes Cognition Cognition Arousal/Alertness: Awake/alert Overall Cognitive Status: Appears within functional limits for tasks assessed Sensation/Coordination Coordination Gross Motor Movements are Fluid and Coordinated: Yes Extremity Assessment RUE Assessment RUE Assessment: Within Functional Limits LUE Assessment LUE Assessment: Within Functional Limits (bil weak at 4/5) RLE Assessment RLE Assessment: Within Functional Limits LLE Assessment LLE Assessment:  (bil weak at 4/5) Mobility (including Balance) Bed Mobility Bed Mobility: Yes Supine to Sit: 7: Independent Sitting - Scoot to Edge of Bed: 7: Independent Transfers Transfers: Yes Sit to Stand: 5: Supervision Sit to Stand Details (indicate cue type and reason): vc's for hand placement/safety Stand to Sit: 5: Supervision Ambulation/Gait Ambulation/Gait: Yes Ambulation/Gait Assistance: Other (comment) (min guard A) Ambulation Distance (Feet): 110 Feet Assistive device: None;Other (Comment)  (IVpole) Gait Pattern: Step-through pattern;Right circumduction (unsteady on her feet overall) Stairs: No  Posture/Postural Control Posture/Postural Control: No significant limitations Exercise    End of  Session PT - End of Session Activity Tolerance: Patient tolerated treatment well Patient left: in chair;with call bell in reach Nurse Communication: Mobility status for transfers;Mobility status for ambulation General Behavior During Session: [facility] for tasks performed Cognition: [facility] for tasks performed  Jen Eppinger, Tessie Fass 08/28/2011, 1:14 PM  08/28/2011  Donnella Sham, PT [phone] (pager)

## 2011-08-28 NOTE — Progress Notes (Signed)
Family Medicine Teaching Service Attending Note  I discussed patient [name] [name]  with Dr. Marily Memos and reviewed their note for today.  I agree with their assessment and plan.     Dr Andria Frames wrote the attending note for today

## 2011-08-29 LAB — RENAL FUNCTION PANEL
Albumin: 3.3 g/dL — ABNORMAL LOW (ref 3.5–5.2)
GFR calc Af Amer: 20 mL/min — ABNORMAL LOW (ref >90)
GFR calc non Af Amer: 17 mL/min — ABNORMAL LOW (ref >90)
Phosphorus: 2.1 mg/dL — ABNORMAL LOW (ref 2.3–4.6)
Potassium: 3.3 mEq/L — ABNORMAL LOW (ref 3.5–5.1)
Sodium: 138 mEq/L (ref 135–145)

## 2011-08-29 LAB — URINE CULTURE

## 2011-08-29 LAB — CBC
MCHC: 34 g/dL (ref 30.0–36.0)
Platelets: 154 10*3/uL (ref 150–400)
RDW: 13.9 % (ref 11.5–15.5)
WBC: 5.5 10*3/uL (ref 4.0–10.5)

## 2011-08-29 LAB — IRON AND TIBC: Iron: 94 ug/dL (ref 42–135)

## 2011-08-29 MED ORDER — SODIUM BICARBONATE 650 MG PO TABS
1300.0000 mg | ORAL_TABLET | Freq: Two times a day (BID) | ORAL | Status: DC
  Administered 2011-08-29 (×2): 1300 mg via ORAL
  Filled 2011-08-29 (×4): qty 2

## 2011-08-29 MED ORDER — LOPERAMIDE HCL 2 MG PO CAPS
2.0000 mg | ORAL_CAPSULE | Freq: Three times a day (TID) | ORAL | Status: DC
  Administered 2011-08-29 – 2011-08-31 (×6): 2 mg via ORAL
  Filled 2011-08-29 (×8): qty 1

## 2011-08-29 NOTE — Progress Notes (Signed)
Subjective: Interval History: has complaints D.  Objective: Vital signs in last 24 hours: Temp:  [98.1 F (36.7 C)-98.7 F (37.1 C)] 98.6 F (37 C) (02/23 0500) Pulse Rate:  [86-99] 99  (02/23 0500) Resp:  [18-21] 18  (02/23 0500) BP: (109-113)/(68-71) 110/69 mmHg (02/23 0500) SpO2:  [97 %-99 %] 97 % (02/23 0500) Weight change:   Intake/Output from previous day: 02/22 0701 - 02/23 0700 In: 240 [P.O.:240] Out: 1000 [Urine:900; Stool:100] Intake/Output this shift: Total I/O In: 120 [P.O.:120] Out: -   General appearance: alert, cooperative and no distress Resp: clear to auscultation bilaterally Cardio: S1, S2 normal and systolic murmur: holosystolic 2/6, blowing at apex GI: ostomy L mid abdm, pos bs, mildly tender Extremities: extremities normal, atraumatic, no cyanosis or edema  Lab Results:  Basename 08/29/11 0630 08/28/11 0915  WBC 5.5 5.4  HGB 9.1* 10.1*  HCT 26.8* 29.1*  PLT 154 163   BMET:  Basename 08/29/11 0500 08/28/11 0915  NA 138 138  K 3.3* 2.8*  CL 108 104  CO2 20 21  GLUCOSE 91 99  BUN 24* 29*  CREATININE 2.83* 3.85*  CALCIUM 7.7* 7.9*   No results found for this basename: PTH:2 in the last 72 hours Iron Studies: No results found for this basename: IRON,TIBC,TRANSFERRIN,FERRITIN in the last 72 hours  Studies/Results: No results found.  I have reviewed the patient's current medications.  Assessment/Plan: 1 ARF ARB and hypovol.  Improving, acid base better. 2 D/ CRohns per Primary 3 Anemia check Fe 4 CKD3 check PTH  P ivf, bicarb, PTH    LOS: 3 days   [name] [name] L 08/29/2011,10:03 AM

## 2011-08-29 NOTE — Progress Notes (Signed)
[facility] Progress Note  Patient name: [name] [name] Medical record number: [id number] Date of birth: 1951/05/27 Age: 61 y.o. Gender: female    LOS: 3 days   Primary Care Provider: Zigmund Gottron, MD, MD  Overnight Events:  No acute events overnight. Reports improved strength and decreased output from ostomy.   Objective: Vital signs in last 24 hours: Temp:  [98.1 F (36.7 C)-98.7 F (37.1 C)] 98.6 F (37 C) (02/23 0500) Pulse Rate:  [86-99] 99  (02/23 0500) Resp:  [18-21] 18  (02/23 0500) BP: (109-113)/(68-71) 110/69 mmHg (02/23 0500) SpO2:  [97 %-99 %] 97 % (02/23 0500)  Wt Readings from Last 3 Encounters:  08/28/11 155 lb (70.308 kg)  07/29/11 153 lb 8 oz (69.627 kg)  07/15/11 154 lb (69.854 kg)   Reviewed all scheduled and prn medications.   PE: Gen:NAD HEENT: MMM CV: RRR, no m/r/g Res: CTAB  Abd: Soft non-tender. Ostomy pink. Loose brown stool in bag.  Ext/Musc: 2+ pulses throughout. No edema Neuro:CN grossly intact.    Labs/Studies:  Basic Metabolic Panel:    Component Value Date/Time   NA 138 08/29/2011 0500   K 3.3* 08/29/2011 0500   CL 108 08/29/2011 0500   CO2 20 08/29/2011 0500   BUN 24* 08/29/2011 0500   CREATININE 2.83* 08/29/2011 0500   CREATININE 1.95* 07/29/2011 1633   GLUCOSE 91 08/29/2011 0500   CALCIUM 7.7* 08/29/2011 0500   Micro: Stool: negative giardia and cryptosporidium. Culture NGTD. Urine culture: >=100,000 COLONIES/ML Culture DIPHTHEROIDS(CORYNEBACTERIUM SPECIES)  Renal artery duplex ultrasound 08/28/11:   Right= No evidence of renal artery stenosis. RAR= 1.33.  Left= 60% stenosis of the proximal and mid segments. 80% stenosis of the distal segment. RAR= 5.13.  Bilateral normal intrarenal RI.  Right Kidney= 11.5cm. Left Kidney= 10.6cm. Bilateral kidneys appear hyperechoic with dilated medullary pyramids.  Aorta: Proximal 2.33cm, Mid 1.69cm, Distal 1.59cm.  Assessment/Plan: [name] [name] is a 61 y.o.  year old female presenting with Acute on Chronic Kidney failure in the setting of diarrhea  and acute onset back pain.    1. Back pain:  A: Resolved. Musculoskeletal/muscle spasm vs nerve impingement vs renal infarct from  Stenosis/malrotation.  P:  - PT - Tylenol  - heat pad prn    2. Acute on Chronic Kidney Failure: Nephrology following. A:  Improving Cr to 2.83 today. Initial bump likely secondary to worsening kidney disease and dehydration resulting in ATN from hypoperfusion. Significant R renal artery stenosis.  P:  - f/u renal recommendations and studies.  -continue NS at 75 ml/hr.    3. Acidosis: in the setting of acute kidney injury. Resolved with PO bicarb and improving renal function.  P: - BMET Tomorrow -continue bicarb with plan to d/c if Cr continue to trend down.   4. Crohn's: No active flare. GI following   - Continue home Asacol    5.  Systolic CHF: Compensated. Pro BNP 207, EKG, and Troponin normal. No complaints or symptoms of exacerbation at this time. No signs or symptoms of pulmonary congestion. Will monitor closely due to continued fluid resuscitation. BP trending up but will continue to hold off on home BP meds.   - Daily weights  - strict I/O   6. FEN/GI: A: Electrolytes wnl with decreasing stool output and improving Cr.  P:  Schedule imodium TID. Kdur 40 mEq BID x 2 doses. BMET in the AM.  Signed: Boykin Nearing, MD Family Medicine Resident PGY-2 [phone] 08/29/2011 12:22 PM

## 2011-08-29 NOTE — Progress Notes (Signed)
Family Medicine Teaching Service Attending Note  I interviewed and examined patient [name] [name] and reviewed their tests and x-rays.  I discussed with Dr. Adrian Blackwater and reviewed their note for today.  I agree with their assessment and plan.     Additionally  No pain today Still with increased ostomy output No signs of infection - Schedule imodium  Proceed as per renal for fluid replacement

## 2011-08-30 LAB — BASIC METABOLIC PANEL
CO2: 17 mEq/L — ABNORMAL LOW (ref 19–32)
Calcium: 7.5 mg/dL — ABNORMAL LOW (ref 8.4–10.5)
Glucose, Bld: 88 mg/dL (ref 70–99)
Potassium: 4.3 mEq/L (ref 3.5–5.1)
Sodium: 138 mEq/L (ref 135–145)

## 2011-08-30 LAB — RENAL FUNCTION PANEL
CO2: 17 mEq/L — ABNORMAL LOW (ref 19–32)
Chloride: 114 mEq/L — ABNORMAL HIGH (ref 96–112)
Creatinine, Ser: 1.89 mg/dL — ABNORMAL HIGH (ref 0.50–1.10)
GFR calc non Af Amer: 28 mL/min — ABNORMAL LOW (ref >90)
Glucose, Bld: 89 mg/dL (ref 70–99)

## 2011-08-30 LAB — CBC
HCT: 26.9 % — ABNORMAL LOW (ref 36.0–46.0)
Hemoglobin: 9.1 g/dL — ABNORMAL LOW (ref 12.0–15.0)
MCH: 31.1 pg (ref 26.0–34.0)
RBC: 2.93 MIL/uL — ABNORMAL LOW (ref 3.87–5.11)

## 2011-08-30 MED ORDER — LORAZEPAM 0.5 MG PO TABS: 0.5000 mg | ORAL_TABLET | Freq: Two times a day (BID) | ORAL | Status: DC

## 2011-08-30 MED ORDER — LORAZEPAM 0.5 MG PO TABS
0.5000 mg | ORAL_TABLET | Freq: Two times a day (BID) | ORAL | Status: DC | PRN
  Administered 2011-08-30 – 2011-08-31 (×2): 0.5 mg via ORAL
  Filled 2011-08-30 (×2): qty 1

## 2011-08-30 MED ORDER — POTASSIUM CHLORIDE CRYS ER 20 MEQ PO TBCR
40.0000 meq | EXTENDED_RELEASE_TABLET | Freq: Every day | ORAL | Status: DC
  Administered 2011-08-30 – 2011-08-31 (×2): 40 meq via ORAL
  Filled 2011-08-30 (×2): qty 2

## 2011-08-30 MED ORDER — SODIUM BICARBONATE 650 MG PO TABS
1300.0000 mg | ORAL_TABLET | Freq: Three times a day (TID) | ORAL | Status: DC
  Administered 2011-08-30 – 2011-08-31 (×4): 1300 mg via ORAL
  Filled 2011-08-30 (×6): qty 2

## 2011-08-30 NOTE — Progress Notes (Signed)
Family Medicine Teaching Service Attending Note  I interviewed and examined patient [name] [name] and reviewed their tests and x-rays.  I discussed with Dr. Marily Memos and reviewed their note for today.  I agree with their assessment and plan.     Additionally  Feels well.  Ostomy output is decreased Bicarb down  Would stop IVF and increase oral bicarb Will need close follow up with bmets

## 2011-08-30 NOTE — Progress Notes (Signed)
[facility] Progress Note  Patient name: [name] [name] Medical record number: [id number] Date of birth: 1950-09-18 Age: 61 y.o. Gender: female    LOS: 4 days   Primary Care Provider: Zigmund Gottron, MD, MD  Overnight Events:  NAEO. No complaints this morning. Having to empty ostomy approximately 3x daily which is the same as at home per pt. Output more formed per pt since starting imodium. Tolerating diet w/o n/v. Back pain is completely resolved. Slept well overnight after receiving lorazepam.   Objective: Vital signs in last 24 hours: Temp:  [97.9 F (36.6 C)-99 F (37.2 C)] 99 F (37.2 C) (02/24 0345) Pulse Rate:  [86-101] 101  (02/24 0345) Resp:  [17-18] 17  (02/24 0345) BP: (116-148)/(79-90) 137/80 mmHg (02/24 0345) SpO2:  [95 %-100 %] 97 % (02/24 0345) Weight:  [157 lb 13.6 oz (71.6 kg)] 157 lb 13.6 oz (71.6 kg) (02/24 0345)  Wt Readings from Last 3 Encounters:  08/30/11 157 lb 13.6 oz (71.6 kg)  07/29/11 153 lb 8 oz (69.627 kg)  07/15/11 154 lb (69.854 kg)     Current Facility-Administered Medications  Medication Dose Route Frequency Provider Last Rate Last Dose  . 0.9 %  sodium chloride infusion   Intravenous Continuous Linna Darner, MD 75 mL/hr at 08/30/11 0525    . acetaminophen (TYLENOL) tablet 650 mg  650 mg Oral Q6H PRN Linna Darner, MD   650 mg at 08/28/11 0416   Or  . acetaminophen (TYLENOL) suppository 650 mg  650 mg Rectal Q6H PRN Linna Darner, MD      . citalopram (CELEXA) tablet 10 mg  10 mg Oral Daily Linna Darner, MD   10 mg at 08/29/11 1020  . heparin injection 5,000 Units  5,000 Units Subcutaneous Q8H Linna Darner, MD   5,000 Units at 08/30/11 0530  . loperamide (IMODIUM) capsule 2 mg  2 mg Oral TID Boykin Nearing, MD   2 mg at 08/29/11 2151  . LORazepam (ATIVAN) tablet 0.5 mg  0.5 mg Oral BID PRN Liam Graham, MD   0.5 mg at 08/30/11 0019  . mesalamine (ASACOL) EC tablet 1,200 mg  1,200 mg Oral BID Linna Darner, MD   1,200 mg at 08/29/11 2151  . pantoprazole (PROTONIX) EC tablet 40 mg  40 mg Oral Q1200 Linna Darner, MD   40 mg at 08/29/11 1145  . potassium chloride SA (K-DUR,KLOR-CON) CR tablet 40 mEq  40 mEq Oral BID Boykin Nearing, MD   40 mEq at 08/29/11 2152  . simvastatin (ZOCOR) tablet 20 mg  20 mg Oral q1800 Linna Darner, MD   20 mg at 08/29/11 1728  . sodium bicarbonate tablet 1,300 mg  1,300 mg Oral BID Placido Sou, MD   1,300 mg at 08/29/11 2152  . DISCONTD: loperamide (IMODIUM) capsule 2 mg  2 mg Oral PRN Liam Graham, MD      . DISCONTD: LORazepam (ATIVAN) tablet 0.5 mg  0.5 mg Oral BID Liam Graham, MD         PE: Gen: NAD,  HEENT: MMM CV: RRR II/VI systolic murmur Res: CTAB, normal effort Abd: soft non-tender, Ostomy in place and intact.  Ext/Musc: no edema. 2+ pulses Neuro: CN intact  Labs/Studies:  CBC:    Component Value Date/Time   WBC 6.0 08/30/2011 0621   HGB 9.1* 08/30/2011 0621   HCT 26.9* 08/30/2011 0621   PLT 155 08/30/2011 0621   MCV 91.8 08/30/2011 0621   NEUTROABS 2.8 07/11/2011 3716  LYMPHSABS 2.7 07/11/2011 0812   MONOABS 0.6 07/11/2011 0812   EOSABS 0.2 07/11/2011 0812   BASOSABS 0.0 07/11/2011 0812     Basic Metabolic Panel:    Component Value Date/Time   NA 142 08/30/2011 0621   K 3.7 08/30/2011 0621   CL 114* 08/30/2011 0621   CO2 17* 08/30/2011 0621   BUN 19 08/30/2011 0621   CREATININE 1.89* 08/30/2011 0621   CREATININE 1.95* 07/29/2011 1633   GLUCOSE 89 08/30/2011 0621   CALCIUM 7.7* 08/30/2011 3403      Assessment/Plan:   [name] [name] is a 61 y.o. year old female presenting with Acute on Chronic Kidney failure in the setting of diarrhea and acute onset back pain.   1. Back pain: Resolved. Musculoskeletal/muscle spasm vs nerve impingement vs renal infarct from Stenosis/malrotation.   - PT  - Tylenol  - heat pad prn   2. Acute on Chronic Kidney Failure: Nephrology following. Appreciate recommendations. Improving Cr to 1.89 today.  Initial bump likely secondary to worsening kidney disease, Renal artery stenosis and dehydration resulting in ATN from hypoperfusion.   -SLIV and recheck BMET @ 17:00 to see if pt can maintain hydration.    3. Acidosis: in the setting of acute kidney injury and diarrhea. Improved w/ IVF and PO bicarb. Bicarb down again today. Will likely need to take Bicarb as outpt until diarrhea resolves.  - BMET 1700 -continue bicarb with plan to d/c if Cr continue to trend down.   4. Anemia: Pt baseline anemic at 11. 9.1 today. Has been trending down since admission. Normocytic. Likely secondary to dilution as pt w/o signs of gross blood loss during admission. Iron studies normal.  - CBC in am.   5. Crohn's: No active flare. GI following  - Continue home Asacol   6. Systolic CHF: Compensated. Pro BNP 207 on admission, EKG, and Troponin normal. No signs or symptoms of exacerbation/pulmcongestion/edema at this time. Will monitor closely due to continued fluid resuscitation. BP trending up but will continue to hold off on home BP meds.  - Daily weights  - strict I/O   6. FEN/GI: Bicarb low despite bicarb replacement. Improving Cr. K normal - Increase Bicarb to 1,300 TID.   - Continue Imodium TID.  - Kdur 40 mEq Qday. - BMET @ 17:00.  7. Disposition: Pending improvement and potential DC this pm.   Signed: Linna Darner, MD Family Medicine Resident PGY-1 [phone] 08/30/2011 7:22 AM

## 2011-08-30 NOTE — Progress Notes (Signed)
Subjective: Interval History: has complaints D.  Objective: Vital signs in last 24 hours: Temp:  [97.9 F (36.6 C)-99 F (37.2 C)] 99 F (37.2 C) (02/24 0345) Pulse Rate:  [86-101] 101  (02/24 0345) Resp:  [17-18] 17  (02/24 0345) BP: (116-148)/(79-90) 137/80 mmHg (02/24 0345) SpO2:  [95 %-100 %] 97 % (02/24 0345) Weight:  [71.6 kg (157 lb 13.6 oz)] 71.6 kg (157 lb 13.6 oz) (02/24 0345) Weight change:   Intake/Output from previous day: 02/23 0701 - 02/24 0700 In: 720 [P.O.:720] Out: 4 [Urine:3; Stool:1] Intake/Output this shift:    General appearance: alert and cooperative Resp: clear to auscultation bilaterally Cardio: regular rate and rhythm and S1, S2 normal GI: ostomy L mid abdm, pos bs Extremities: extremities normal, atraumatic, no cyanosis or edema  Lab Results:  Basename 08/30/11 0621 08/29/11 0630  WBC 6.0 5.5  HGB 9.1* 9.1*  HCT 26.9* 26.8*  PLT 155 154   BMET:  Basename 08/30/11 0621 08/29/11 0500  NA 142 138  K 3.7 3.3*  CL 114* 108  CO2 17* 20  GLUCOSE 89 91  BUN 19 24*  CREATININE 1.89* 2.83*  CALCIUM 7.7* 7.7*   No results found for this basename: PTH:2 in the last 72 hours Iron Studies:  Basename 08/29/11 1104  IRON 94  TIBC 268  TRANSFERRIN --  FERRITIN --    Studies/Results: No results found.  I have reviewed the patient's current medications.  Assessment/Plan: 1 AKI resolving, close to baseline.  Need to ^ bicarb.  Losses are stool 2 Crohns with D, need to resolve 3 Anemia  P ^ po bicarb, will s/o at this time    LOS: 4 days   Travers Goodley L 08/30/2011,9:39 AM

## 2011-08-31 LAB — PARATHYROID HORMONE, INTACT (NO CA): PTH: 89.4 pg/mL — ABNORMAL HIGH (ref 14.0–72.0)

## 2011-08-31 LAB — STOOL CULTURE

## 2011-08-31 MED ORDER — SODIUM BICARBONATE 650 MG PO TABS: 1300.0000 mg | ORAL_TABLET | Freq: Two times a day (BID) | ORAL | Status: DC

## 2011-08-31 MED ORDER — LOPERAMIDE HCL 2 MG PO CAPS: 2.0000 mg | ORAL_CAPSULE | Freq: Four times a day (QID) | ORAL | Status: AC | PRN

## 2011-08-31 NOTE — Discharge Instructions (Signed)
You were admitted due to severe back pain and abnormalities in your electrolytes due to diarrhea. This was likely due to a viral illness. You back pain was likely due to muscle spasm. Please continue taking all medications as prescribed. Please follow up with Dr. Andria Frames on Friday March 1st at 10am.

## 2011-08-31 NOTE — Discharge Summary (Signed)
I have seen and examined this patient. I have discussed with Dr Marily Memos.  I agree with their findings and plans as documented in their discharge note for today.

## 2011-08-31 NOTE — Discharge Summary (Signed)
Family Medicine Resident Discharge Summary  Patient ID: [name] [name] [id number] 61 y.o. [birth year]  Admit date: 08/26/2011  Discharge date and time: No discharge date for patient encounter.   Admitting Physician: [name], MD   Discharge Physician: Blane Ohara McDiarmid  Admission Diagnoses: Abdominal pain [789.0] Acute on chronic renal failure [584.9] Back Spasms  Discharge Diagnoses: muscle spasm, dehydration,   Admission Condition: fair  Discharged Condition: good  Indication for Admission: Severe back pain w/ several day h/o diarrhea and electrolyte abnormalities   Hospital Course: [name] [name] is a 61 y.o. year old female presenting with diarrhea, Acute on Chronic Kidney failure and acute onset back pain  Back pain: Pain likely musculoskeletal vs renal ischemia. Relieved w/ tylenol, heating pads, and rest. Pain was more superficial w/ point tenderness. Pt w/o pain complaints at time of DC. 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   Acute on Chronic Kidney failure: Pt w/ baseline CKD Stage III, Cr of around 2. Cr on admission 4.72. Kidney function responded very well to fluid rehydration. Cr trended down w/ serial exams to 1.7 at time of DC. Renal consulted and followed pt throughout admission. Renal artery vascular studies showing L stenosis of 60%-80%. 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 Electrolytes improved as kidney function improved and diarrhea managed.   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 Multiple day h/o diarrhea prior to admission. Likely viral in nature. Pt severely dehydrated on admission. Received 3L NS initially then D5 11mq bicarb at 1091mhr for 2 days. Transitioned back to NS at 7529mr until the day prior to DC. KVO for > 24hrs prior to DC.  Stool cx negative. Outputs more formed and back to baseline of needing to empty bag 3x daily by time of DC. Pt  diarrhea treated w/ Imodium. Bicarb of 11 on admission. Likely due to GI losses. Pt initially given bicarb IV then PO w/ improvement of Bicarb to 17 (Baseline Bicarb of around 15-18 ever since subtotal colectomy in august of 2012).     Consults: nephrology  Significant Diagnostic Studies:   Lactic Acid, Venous: 0.6   Admission UA: Cloudy appearance, 100 protein, moderate Hgb, Many bacteria, Ca Oxalate crystals, Granular casts   UCX: Negative   ProBNP: 207   EKG: normal   CT Abdomen:  Findings: Interval malrotation of the right kidney. Minimal urine in the urinary bladder. No bladder, ureteral or renal calculi  seen. No hydronephrosis. The colon is surgically absent with the exception of the rectum and distal sigmoid colon. A portion of the small bowel is also surgically absent. The patient has a left anterior ileostomy. There is also herniated small bowel within a parastomal hernia. No bowel dilatation or wall thickening is demonstrated. Cholecystectomy clips are noted. Unremarkable noncontrasted appearance of the liver, spleen, pancreas and adrenal glands. The uterus and ovaries are unremarkable. No enlarged lymph nodes or free peritoneal fluid. Small amount of linear density at both lung bases. Unremarkable bones.  IMPRESSION:  1. No acute abnormality.  2. 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  3. Status post subtotal colectomy.   Discharge Exam: Gen: NAD,  HEENT: MMM  CV: RRR II/VI systolic murmur  Res: CTAB, normal effort  Abd: soft non-tender, Ostomy in place and intact.  Ext/Musc: no edema. 2+ pulses  Neuro: CN intact  Disposition: home  Patient Instructions:  Current Discharge Medication List  CONTINUE these medications which have NOT CHANGED   Details  acetaminophen (TYLENOL) 500 MG tablet Take 1,000 mg by mouth at bedtime as needed. For pain    calcium-vitamin D (OSCAL WITH D 500-200) 500-200 MG-UNIT per tablet Take 1 tablet by mouth  2 (two) times daily.     citalopram (CELEXA) 10 MG tablet Take 10 mg by mouth daily.    isosorbide mononitrate (IMDUR) 30 MG 24 hr tablet Take 1 tablet (30 mg total) by mouth at bedtime. Qty: 90 tablet, Refills: 3   Associated Diagnoses: Congestive heart failure with left ventricular systolic dysfunction    LORazepam (ATIVAN) 0.5 MG tablet Take 1 tablet (0.5 mg total) by mouth 2 (two) times daily. For anxiety Qty: 60 tablet, Refills: 5   Associated Diagnoses: Anxiety    mesalamine (ASACOL) 400 MG EC tablet Take 1,200 mg by mouth 2 (two) times daily. 3 tabs twice daily    metoprolol succinate (TOPROL-XL) 50 MG 24 hr tablet Take 50 mg by mouth daily.    omeprazole (PRILOSEC) 20 MG capsule     simvastatin (ZOCOR) 20 MG tablet     valsartan (DIOVAN) 80 MG tablet Take 80 mg by mouth daily.       Activity: activity as tolerated Diet: regular diet  Follow-up with Dr. Andria Frames on March 1st at 10am.    Follow-up Items: 1. Chronic low Bicarb since ileostomy in Aug of 2012. No on bicarb replacement (+/- DC) 2. Acute on Chronic Kidney Disease. Recommend BMET   Signed: Linna Darner, MD Family Medicine Resident PGY-1 [phone] 08/31/2011 8:20 AM

## 2011-08-31 NOTE — Progress Notes (Signed)
Pt was provided with discharge teaching. Pt was able to verbalize and teachback learning. Pt plans to follow up with DR Hensel this Friday. Pt was educated on new medications and how to take them. No needs at this time. Pt is awaiting ride from family members. Hulen Luster, RN

## 2011-09-04 ENCOUNTER — Ambulatory Visit (INDEPENDENT_AMBULATORY_CARE_PROVIDER_SITE_OTHER): Payer: Medicare Other | Admitting: Family Medicine

## 2011-09-04 ENCOUNTER — Encounter: Payer: Self-pay | Admitting: Family Medicine

## 2011-09-04 VITALS — BP 119/69 | HR 95 | Temp 98.6°F | Ht 66.0 in | Wt 154.7 lb

## 2011-09-04 DIAGNOSIS — R197 Diarrhea, unspecified: Secondary | ICD-10-CM

## 2011-09-04 DIAGNOSIS — I701 Atherosclerosis of renal artery: Secondary | ICD-10-CM | POA: Insufficient documentation

## 2011-09-04 DIAGNOSIS — I509 Heart failure, unspecified: Secondary | ICD-10-CM

## 2011-09-04 DIAGNOSIS — I5022 Chronic systolic (congestive) heart failure: Secondary | ICD-10-CM

## 2011-09-04 NOTE — Assessment & Plan Note (Signed)
Seems well controled despite no diuretic and on extra Na load from bicarb therapy.  Follow closely.  Stop bicarb when feasible.

## 2011-09-04 NOTE — Assessment & Plan Note (Signed)
New diagnosis and this puts her at risk for problems with ARB especially dehydration.  Yet she needs the ARB with her hx of CHF.  Will monitor carefully.

## 2011-09-04 NOTE — Assessment & Plan Note (Signed)
She was near baseline when [facility].  Repeat labs.  Consider DC sodium bicarb.  Also check for continued metabolic abnormalities with cramps.
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
## 2011-09-04 NOTE — Assessment & Plan Note (Signed)
Using immodium and only one or two loose stools per day.

## 2011-09-04 NOTE — Patient Instructions (Signed)
Stay on all your same medicines.   I will call you Monday with your lab results. Also, I will talk with you more about whether we just watch this narrowing of the blood flow to your left kidney or whether we need to do something about it.

## 2011-09-05 LAB — COMPLETE METABOLIC PANEL WITH GFR
Albumin: 4.4 g/dL (ref 3.5–5.2)
BUN: 23 mg/dL (ref 6–23)
Calcium: 8.3 mg/dL — ABNORMAL LOW (ref 8.4–10.5)
Chloride: 108 mEq/L (ref 96–112)
GFR, Est Non African American: 24 mL/min — ABNORMAL LOW
Glucose, Bld: 87 mg/dL (ref 70–99)
Potassium: 4.2 mEq/L (ref 3.5–5.3)

## 2011-09-05 LAB — MAGNESIUM: Magnesium: 1 mg/dL — ABNORMAL LOW (ref 1.5–2.5)

## 2011-09-07 MED ORDER — MAGNESIUM OXIDE 400 MG PO TABS: 400.0000 mg | ORAL_TABLET | Freq: Two times a day (BID) | ORAL | Status: DC

## 2011-09-07 NOTE — Progress Notes (Signed)
Addended by: Domenic Schwab on: 09/07/2011 03:18 PM   Modules accepted: Orders

## 2011-09-07 NOTE — Progress Notes (Signed)
  Subjective:    Patient ID: [name] [name], female    DOB: [birth year], 62 y.o.   MRN: [id number]  HPI  Called.  She is drinking 2 12 oz bottles of water daily.  I asked her to increase to 4 daily.  Will see in 5 days to repeat labs.  Also add MgOx due to hypomag.  Be careful on dosing of Mag in renal failure.    Review of Systems     Objective:   Physical Exam        Assessment & Plan:

## 2011-09-09 ENCOUNTER — Encounter: Payer: Self-pay | Admitting: Cardiovascular Disease

## 2011-09-09 ENCOUNTER — Ambulatory Visit (INDEPENDENT_AMBULATORY_CARE_PROVIDER_SITE_OTHER): Payer: Medicare Other | Admitting: Cardiovascular Disease

## 2011-09-09 VITALS — BP 110/76 | HR 82 | Ht 66.0 in | Wt 155.0 lb

## 2011-09-09 DIAGNOSIS — I5022 Chronic systolic (congestive) heart failure: Secondary | ICD-10-CM

## 2011-09-09 DIAGNOSIS — I509 Heart failure, unspecified: Secondary | ICD-10-CM

## 2011-09-09 NOTE — Assessment & Plan Note (Signed)
[name] [name] has chronic systolic congestive heart failure with an ejection fraction of around 30-35%. She's been on medical therapy now for several months. She still eating quite a bit of extra salt. We had a long talk about that. She'll be cutting back her salt intake.  I'll see her in 3 months. We'll get an echocardiogram prior to that visit. If her ejection fraction is still less than 35%, we'll need to consider her for AICD placement.

## 2011-09-09 NOTE — Patient Instructions (Signed)
Your physician has requested that you have an echocardiogram. Echocardiography is a painless test that uses sound waves to create images of your heart. It provides your doctor with information about the size and shape of your heart and how well your heart's chambers and valves are working. This procedure takes approximately one hour. There are no restrictions for this procedure.   Your physician recommends that you schedule a follow-up appointment in: 3 MONTHS WITH EKG    2 Gram Low Sodium Diet A 2 gram sodium diet restricts the amount of sodium in the diet to no more than 2 g or 2000 mg daily. Limiting the amount of sodium is often used to help lower blood pressure. It is important if you have heart, liver, or kidney problems. Many foods contain sodium for flavor and sometimes as a preservative. When the amount of sodium in a diet needs to be low, it is important to know what to look for when choosing foods and drinks. The following includes some information and guidelines to help make it easier for you to adapt to a low sodium diet. QUICK TIPS  Do not add salt to food.   Avoid convenience items and fast food.   Choose unsalted snack foods.   Buy lower sodium products, often labeled as "lower sodium" or "no salt added."   Check food labels to learn how much sodium is in 1 serving.   When eating at a restaurant, ask that your food be prepared with less salt or none, if possible.  READING FOOD LABELS FOR SODIUM INFORMATION The nutrition facts label is a good place to find how much sodium is in foods. Look for products with no more than 500 to 600 mg of sodium per meal and no more than 150 mg per serving. Remember that 2 g = 2000 mg. The food label may also list foods as:  Sodium-free: Less than 5 mg in a serving.   Very low sodium: 35 mg or less in a serving.   Low-sodium: 140 mg or less in a serving.   Light in sodium: 50% less sodium in a serving. For example, if a food that  usually has 300 mg of sodium is changed to become light in sodium, it will have 150 mg of sodium.   Reduced sodium: 25% less sodium in a serving. For example, if a food that usually has 400 mg of sodium is changed to reduced sodium, it will have 300 mg of sodium.  CHOOSING FOODS Grains  Avoid: Salted crackers and snack items. Some cereals, including instant hot cereals. Bread stuffing and biscuit mixes. Seasoned rice or pasta mixes.   Choose: Unsalted snack items. Low-sodium cereals, oats, puffed wheat and rice, shredded wheat. English muffins and bread. Pasta.  Meats  Avoid: Salted, canned, smoked, spiced, pickled meats, including fish and poultry. Bacon, ham, sausage, cold cuts, hot dogs, anchovies.   Choose: Low-sodium canned tuna and salmon. Fresh or frozen meat, poultry, and fish.  Dairy  Avoid: Processed cheese and spreads. Cottage cheese. Buttermilk and condensed milk. Regular cheese.   Choose: Milk. Low-sodium cottage cheese. Yogurt. Sour cream. Low-sodium cheese.  Fruits and Vegetables  Avoid: Regular canned vegetables. Regular canned tomato sauce and paste. Frozen vegetables in sauces. Olives. Angie Fava. Relishes. Sauerkraut.   Choose: Low-sodium canned vegetables. Low-sodium tomato sauce and paste. Frozen or fresh vegetables. Fresh and frozen fruit.  Condiments  Avoid: Canned and packaged gravies. Worcestershire sauce. Tartar sauce. Barbecue sauce. Soy sauce. Steak sauce. Ketchup. Onion,  garlic, and table salt. Meat flavorings and tenderizers.   Choose: Fresh and dried herbs and spices. Low-sodium varieties of mustard and ketchup. Lemon juice. Tabasco sauce. Horseradish.  SAMPLE 2 GRAM SODIUM MEAL PLAN Breakfast / Sodium (mg)  1 cup low-fat milk / 299 mg   2 slices whole-wheat toast / 270 mg   1 tbs heart-healthy margarine / 153 mg   1 hard-boiled egg / 139 mg   1 small orange / 0 mg  Lunch / Sodium (mg)  1 cup raw carrots / 76 mg    cup hummus / 298 mg   1  cup low-fat milk / 143 mg    cup red grapes / 2 mg   1 whole-wheat pita bread / 356 mg  Dinner / Sodium (mg)  1 cup whole-wheat pasta / 2 mg   1 cup low-sodium tomato sauce / 73 mg   3 oz lean ground beef / 57 mg   1 small side salad (1 cup raw spinach leaves,  cup cucumber,  cup yellow bell pepper) with 1 tsp olive oil and 1 tsp red wine vinegar / 25 mg  Snack / Sodium (mg)  1 container low-fat vanilla yogurt / 107 mg   3 graham cracker squares / 127 mg  Nutrient Analysis  Calories: 2033   Protein: 77 g   Carbohydrate: 282 g   Fat: 72 g   Sodium: 1971 mg  Document Released: 06/22/2005 Document Revised: 06/11/2011 Document Reviewed: 09/23/2009 [facility] Patient Information 2012 [place], [place].

## 2011-09-09 NOTE — Progress Notes (Signed)
[name] Date of Birth  06-May-1951 [facility] [address]    [place], [place]  [address] [phone]  Fax  [phone]  Problems: 1. Congestive heart failure-EF of 30-35%, normal myoview in May , 2012 1. Chronic renal insufficiency 2. Chronic anemia  History of Present Illness:  This is a 61 year old female with a recent diagnosis of congestive heart failure. She was recently admitted to the hospital with shortness of breath. She was found to have a pneumonia. She was also found to have a severely reduced left ventricular systolic function with an ejection fraction of 35%.  In May she had a stress Myoview study that revealed normal left ventricle systolic function and no evidence of ischemia.  She's feeling quite a bit better on medical therapy. Or shortness breath is better. She still has some dyspnea.   She's been taking all her medications. She still eats some salt on regular basis.     Current Outpatient Prescriptions on File Prior to Visit  Medication Sig Dispense Refill  . acetaminophen (TYLENOL) 500 MG tablet Take 1,000 mg by mouth at bedtime as needed. For pain      . calcium-vitamin D (OSCAL WITH D 500-200) 500-200 MG-UNIT per tablet Take 1 tablet by mouth 2 (two) times daily.       . citalopram (CELEXA) 10 MG tablet Take 10 mg by mouth daily.      . isosorbide mononitrate (IMDUR) 30 MG 24 hr tablet Take 1 tablet (30 mg total) by mouth at bedtime.  90 tablet  3  . loperamide (IMODIUM) 2 MG capsule Take 1 capsule (2 mg total) by mouth 4 (four) times daily as needed for diarrhea or loose stools.  30 capsule  0  . LORazepam (ATIVAN) 0.5 MG tablet Take 1 tablet (0.5 mg total) by mouth 2 (two) times daily. For anxiety  60 tablet  5  . magnesium oxide (MAG-OX 400) 400 MG tablet Take 1 tablet (400 mg total) by mouth 2 (two) times daily.  14 tablet  0  . mesalamine (ASACOL) 400 MG EC tablet Take 1,200 mg by mouth 2 (two) times daily. 3 tabs twice daily       . metoprolol succinate (TOPROL-XL) 50 MG 24 hr tablet Take 50 mg by mouth daily.      Marland Kitchen omeprazole (PRILOSEC) 20 MG capsule       . simvastatin (ZOCOR) 20 MG tablet       . sodium bicarbonate 650 MG tablet Take 2 tablets (1,300 mg total) by mouth 2 (two) times daily.  120 tablet  0  . valsartan (DIOVAN) 80 MG tablet Take 80 mg by mouth daily.        Allergies  Allergen Reactions  . Amoxicillin Anaphylaxis and Rash    REACTION:Throat Swelling  . Penicillins Anaphylaxis and Rash  . Ace Inhibitors Cough    ACE possibly associated with cough and switched to ARB.  Would be OK to re-challenge if needed.  . Gabapentin     Patient had one time seizure shortly after stopping gabapentin  . Aspirin Itching and Rash    REACTION: Rash only.  NO breathing issues with use.   Able to tolerate ASACOL    Past Medical History  Diagnosis Date  . Crohn disease   . Hypertension   . Hyperlipidemia   . CHF (congestive heart failure)     Past Surgical History  Procedure Date  . Cholecystectomy, laparoscopic 01/28/05  . 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 Salpingectomy     left    History  Smoking status  . Former Smoker -- 0.5 packs/day for 18 years  . Types: Cigarettes  . Quit date: 05/10/2011  Smokeless tobacco  . Never Used    History  Alcohol Use No    No family history on file.  Reviw of Systems:  Reviewed in the HPI.  All other systems are negative.  Physical Exam: BP 110/76  Pulse 82  Ht 5' 6"  (1.676 m)  Wt 155 lb (70.308 kg)  BMI 25.02 kg/m2 The patient is alert and oriented x 3.  The mood and affect are normal.   Skin: warm and dry.  Color is normal.    HEENT:   Normocephalic/atraumatic. She has normal carotids. There is no JVD.  Lungs: Lung exam is clear.   Heart: Regular rate S1-S2.    Abdomen: She has colostomy on her left side.  +BS. nontender  Extremities:  She has no clubbing cyanosis or edema.  Neuro:  There exam is nonfocal.    ECG:  Assessment /  Plan:

## 2011-09-11 ENCOUNTER — Encounter: Payer: Self-pay | Admitting: Family Medicine

## 2011-09-11 ENCOUNTER — Other Ambulatory Visit (HOSPITAL_COMMUNITY)
Admission: RE | Admit: 2011-09-11 | Discharge: 2011-09-11 | Disposition: A | Discharge status: Home / Self Care | Payer: Medicare Other | Source: Ambulatory Visit | Attending: Family Medicine | Admitting: Family Medicine

## 2011-09-11 ENCOUNTER — Ambulatory Visit (INDEPENDENT_AMBULATORY_CARE_PROVIDER_SITE_OTHER): Payer: Medicare Other | Admitting: Family Medicine

## 2011-09-11 DIAGNOSIS — Z124 Encounter for screening for malignant neoplasm of cervix: Secondary | ICD-10-CM

## 2011-09-11 DIAGNOSIS — N183 Chronic kidney disease, stage 3 unspecified: Secondary | ICD-10-CM

## 2011-09-11 DIAGNOSIS — Z1159 Encounter for screening for other viral diseases: Secondary | ICD-10-CM | POA: Insufficient documentation

## 2011-09-11 LAB — BASIC METABOLIC PANEL
Calcium: 8.8 mg/dL (ref 8.4–10.5)
Creat: 1.58 mg/dL — ABNORMAL HIGH (ref 0.50–1.10)

## 2011-09-11 NOTE — Patient Instructions (Signed)
Thank you for letting me do your pap today. I will call with blood work results. Keep drinking lots of water.

## 2011-09-11 NOTE — Progress Notes (Signed)
  Subjective:    Patient ID: [name] [name], female    DOB: 1951-01-30, 61 y.o.   MRN: [id number]  HPI States drinking more water as instructed.  Also took Mg.   Reluctantly, is willing to do her PAP, she hates them    Review of Systems     Objective:   Physical Exam Lungs clear Cervix seems to have large nabothian cyst, pap taken, no discharge, bimanual normal No edema      Assessment & Plan:

## 2011-09-14 MED ORDER — MAGNESIUM OXIDE 400 MG PO TABS: 400.0000 mg | ORAL_TABLET | Freq: Two times a day (BID) | ORAL | Status: AC

## 2011-09-14 NOTE — Progress Notes (Signed)
  Subjective:    Patient ID: [name] [name], female    DOB: 03-05-1951, 61 y.o.   MRN: [id number]  HPI Called, see documentation under problems.  I will follow up with BMP and Mg in 3 weeks.    Review of Systems     Objective:   Physical Exam        Assessment & Plan:

## 2011-09-14 NOTE — Assessment & Plan Note (Addendum)
Creat came down nicely with hydration.  Given normal CO2, will DC sodium bicarb because of my concerns about sodium and CHF.

## 2011-09-14 NOTE — Progress Notes (Signed)
Addended by: Domenic Schwab on: 09/14/2011 11:06 AM   Modules accepted: Orders

## 2011-09-21 ENCOUNTER — Ambulatory Visit: Payer: Medicare Other | Admitting: Home Health Services

## 2011-09-21 ENCOUNTER — Encounter: Payer: Self-pay | Admitting: Family Medicine

## 2011-09-21 DIAGNOSIS — R87610 Atypical squamous cells of undetermined significance on cytologic smear of cervix (ASC-US): Secondary | ICD-10-CM | POA: Insufficient documentation

## 2011-09-21 DIAGNOSIS — IMO0002 Reserved for concepts with insufficient information to code with codable children: Secondary | ICD-10-CM

## 2011-09-21 NOTE — Progress Notes (Signed)
  Subjective:    Patient ID: [name] [name], female    DOB: 06/17/1951, 61 y.o.   MRN: [id number]  HPI Entered problem of abnormal PAP and updated health maint.   Review of Systems     Objective:   Physical Exam        Assessment & Plan:

## 2011-09-23 ENCOUNTER — Other Ambulatory Visit: Payer: Self-pay | Admitting: Family Medicine

## 2011-09-23 ENCOUNTER — Ambulatory Visit (INDEPENDENT_AMBULATORY_CARE_PROVIDER_SITE_OTHER): Payer: Medicare Other | Admitting: Home Health Services

## 2011-09-23 ENCOUNTER — Encounter: Payer: Self-pay | Admitting: Home Health Services

## 2011-09-23 VITALS — BP 133/87 | HR 78 | Temp 97.8°F | Ht 66.0 in | Wt 155.5 lb

## 2011-09-23 DIAGNOSIS — Z0000 Encounter for general adult medical examination without abnormal findings: Secondary | ICD-10-CM

## 2011-09-23 DIAGNOSIS — Z780 Asymptomatic menopausal state: Secondary | ICD-10-CM

## 2011-09-23 NOTE — Progress Notes (Signed)
Patient here for annual wellness visit, patient reports: Risk Factors/Conditions needing evaluation or treatment: Pt does not have any risk factors that need evaluation. Home Safety: Pt lives with granddaughter and partner in 1 story home.  Pt reports having smoke detectors and does not have adaptive equipment in bathroom.  Other Information: Corrective lens: Pt has reading glasses and visits eye doctor annually. Dentures: Pt has full upper and partial bottom dentures. Memory: Pt denies memory problems. Patient's Mini Mental Score (recorded in doc. flowsheet): 30  Balance/Gait:  Balance Abnormal Patient value  Sitting balance    Sit to stand    Attempts to arise    Immediate standing balance    Standing balance    Nudge    Eyes closed- Romberg    Tandem stance x Pt unsure of herself  Back lean    Neck Rotation    360 degree turn    Sitting down     Gait Abnormal Patient value  Initiation of gait    Step length-left    Step length-right    Step height-left    Step height-right    Step symmetry    Step continuity    Path deviation    Trunk movement    Walking stance        Annual Wellness Visit Requirements Recorded Today In  Medical, family, social history Past Medical, Family, Social History Section  Current providers Care team  Current medications Medications  Wt, BP, Ht, BMI Vital signs  Tobacco, alcohol, illicit drug use History  ADL Nurse Assessment  Depression Screening Nurse Assessment  Cognitive impairment Nurse Assessment  Mini Mental Status Document Flowsheet  Fall Risk Nurse Assessment  Home Safety Progress Note  End of Life Planning (welcome visit) Social Documentation  Medicare preventative services Progress Note  Risk factors/conditions needing evaluation/treatment Progress Note  Personalized health advice Patient Instructions, goals, letter  Diet & Exercise Social Documentation  Emergency Contact Social Documentation  Seat Belts Social  Documentation  Sun exposure/protection Social Documentation    Prevention Plan:   Recommended Medicare Prevention Screenings Women over 65 Test For Frequency Date of Last- BOLD if needed  Breast Cancer 1-2 yrs 10/12  Cervical Cancer 1-3 yrs 2/13  Colorectal Cancer 1-10 yrs 10/07  Osteoporosis once recommended  Cholesterol 5 yrs 2001  Diabetes yearly 2/13  HIV yearly declined  Influenza Shot yearly 2012  Pneumonia Shot once NI- age  Zostavax Shot once NI- age

## 2011-09-23 NOTE — Patient Instructions (Addendum)
1. Consider starting walking 2-3 times a week. 2. Consider getting a bone density screening. 3. Continue to manage your weight by focusing on fruits and vegetables. 4. Continue to take your medications daily as prescribed. 5. Follow up with Dr. Andria Frames about your skin peeling on hands on 10/07/11.   Great job on quitting smoking!!

## 2011-09-24 ENCOUNTER — Encounter (HOSPITAL_COMMUNITY): Payer: Self-pay | Admitting: Emergency Medicine

## 2011-09-24 ENCOUNTER — Emergency Department (HOSPITAL_COMMUNITY)
Admission: EM | Admit: 2011-09-24 | Discharge: 2011-09-24 | Disposition: A | Discharge status: Home / Self Care | Payer: Medicare Other | Attending: Emergency Medicine | Admitting: Emergency Medicine

## 2011-09-24 ENCOUNTER — Other Ambulatory Visit: Payer: Medicare Other

## 2011-09-24 ENCOUNTER — Encounter: Payer: Self-pay | Admitting: Home Health Services

## 2011-09-24 ENCOUNTER — Emergency Department (HOSPITAL_COMMUNITY): Payer: Medicare Other

## 2011-09-24 DIAGNOSIS — K5090 Crohn's disease, unspecified, without complications: Secondary | ICD-10-CM | POA: Insufficient documentation

## 2011-09-24 DIAGNOSIS — R5381 Other malaise: Secondary | ICD-10-CM | POA: Insufficient documentation

## 2011-09-24 DIAGNOSIS — I10 Essential (primary) hypertension: Secondary | ICD-10-CM | POA: Insufficient documentation

## 2011-09-24 DIAGNOSIS — Z79899 Other long term (current) drug therapy: Secondary | ICD-10-CM | POA: Insufficient documentation

## 2011-09-24 DIAGNOSIS — H53149 Visual discomfort, unspecified: Secondary | ICD-10-CM | POA: Insufficient documentation

## 2011-09-24 DIAGNOSIS — H539 Unspecified visual disturbance: Secondary | ICD-10-CM | POA: Insufficient documentation

## 2011-09-24 DIAGNOSIS — I509 Heart failure, unspecified: Secondary | ICD-10-CM | POA: Insufficient documentation

## 2011-09-24 DIAGNOSIS — R51 Headache: Secondary | ICD-10-CM | POA: Insufficient documentation

## 2011-09-24 DIAGNOSIS — E785 Hyperlipidemia, unspecified: Secondary | ICD-10-CM | POA: Insufficient documentation

## 2011-09-24 MED ORDER — DIPHENHYDRAMINE HCL 50 MG/ML IJ SOLN
25.0000 mg | Freq: Once | INTRAMUSCULAR | Status: AC
  Administered 2011-09-24: 25 mg via INTRAVENOUS
  Filled 2011-09-24: qty 1

## 2011-09-24 MED ORDER — DEXAMETHASONE SODIUM PHOSPHATE 10 MG/ML IJ SOLN
10.0000 mg | Freq: Once | INTRAMUSCULAR | Status: AC
  Administered 2011-09-24: 10 mg via INTRAVENOUS
  Filled 2011-09-24: qty 1

## 2011-09-24 MED ORDER — SODIUM CHLORIDE 0.9 % IV BOLUS (SEPSIS)
1000.0000 mL | Freq: Once | INTRAVENOUS | Status: AC
  Administered 2011-09-24: 1000 mL via INTRAVENOUS

## 2011-09-24 MED ORDER — METOCLOPRAMIDE HCL 5 MG/ML IJ SOLN
10.0000 mg | Freq: Once | INTRAMUSCULAR | Status: AC
  Administered 2011-09-24: 10 mg via INTRAVENOUS
  Filled 2011-09-24: qty 2

## 2011-09-24 MED ORDER — ACETAMINOPHEN 325 MG PO TABS
650.0000 mg | ORAL_TABLET | Freq: Once | ORAL | Status: AC
  Administered 2011-09-24: 650 mg via ORAL
  Filled 2011-09-24: qty 2

## 2011-09-24 MED ORDER — DIPHENHYDRAMINE HCL 50 MG/ML IJ SOLN
INTRAMUSCULAR | Status: AC
  Filled 2011-09-24: qty 1

## 2011-09-24 MED ORDER — DIPHENHYDRAMINE HCL 50 MG/ML IJ SOLN
25.0000 mg | Freq: Once | INTRAMUSCULAR | Status: AC
  Administered 2011-09-24: 50 mg via INTRAVENOUS

## 2011-09-24 NOTE — ED Notes (Signed)
Patient with sudden onset of head pain on the right side, sharp in nature.  Patient has been nauseated, with some photophobia.

## 2011-09-24 NOTE — ED Provider Notes (Signed)
History     CSN: [id number]  Arrival date & time 09/24/11  0223   First MD Initiated Contact with Patient 09/24/11 0229      Chief Complaint  Patient presents with  . Headache    (Consider location/radiation/quality/duration/timing/severity/associated sxs/prior treatment) Patient is a 61 y.o. female presenting with headaches. The history is provided by the patient. No language interpreter was used.  Headache  This is a new problem. The current episode started yesterday. The problem occurs constantly. The problem has been gradually worsening. The headache is associated with bright light. The pain is located in the right unilateral region. The quality of the pain is described as sharp. The pain is moderate. The pain does not radiate. Pertinent negatives include no anorexia, no fever, no malaise/fatigue, no chest pressure, no near-syncope, no palpitations, no shortness of breath, no nausea and no vomiting. She has tried nothing for the symptoms.    Past Medical History  Diagnosis Date  . Crohn disease   . Hypertension   . Hyperlipidemia   . CHF (congestive heart failure)     Past Surgical History  Procedure Date  . Cholecystectomy, laparoscopic 01/28/05  . 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 Salpingectomy     left    Family History  Problem Relation Age of Onset  . Heart disease Mother   . Stroke Father     History  Substance Use Topics  . Smoking status: Former Smoker -- 0.5 packs/day for 18 years    Types: Cigarettes    Quit date: 05/10/2011  . Smokeless tobacco: Never Used  . Alcohol Use: No    OB History    Grav Para Term Preterm Abortions TAB SAB Ect Mult Living                  Review of Systems  Constitutional: Positive for fatigue. Negative for fever, chills, malaise/fatigue, activity change and appetite change.  HENT: Negative for congestion, sore throat, rhinorrhea, neck pain and neck stiffness.   Eyes: Positive for photophobia and visual disturbance.  Negative for pain and redness.  Respiratory: Negative for cough and shortness of breath.   Cardiovascular: Negative for chest pain, palpitations and near-syncope.  Gastrointestinal: Negative for nausea, vomiting, abdominal pain and anorexia.  Genitourinary: Negative for dysuria, urgency, frequency and flank pain.  Musculoskeletal: Negative for myalgias, back pain and arthralgias.  Neurological: Positive for headaches. Negative for dizziness, weakness, light-headedness and numbness.  All other systems reviewed and are negative.    Allergies  Amoxicillin; Penicillins; Ace inhibitors; Gabapentin; and Aspirin  Home Medications   Current Outpatient Rx  Name Route Sig Dispense Refill  . ACETAMINOPHEN 500 MG PO TABS Oral Take 1,000 mg by mouth at bedtime as needed. For pain    . CITALOPRAM HYDROBROMIDE 10 MG PO TABS Oral Take 10 mg by mouth daily.    . ISOSORBIDE MONONITRATE ER 30 MG PO TB24 Oral Take 1 tablet (30 mg total) by mouth at bedtime. 90 tablet 3  . LORAZEPAM 0.5 MG PO TABS Oral Take 1 tablet (0.5 mg total) by mouth 2 (two) times daily. For anxiety 60 tablet 5  . MAGNESIUM OXIDE 400 MG PO TABS Oral Take 400 mg by mouth 2 (two) times daily.    Marland Kitchen MESALAMINE 400 MG PO TBEC Oral Take 1,200 mg by mouth 2 (two) times daily. 3 tabs twice daily    . METOPROLOL SUCCINATE ER 50 MG PO TB24 Oral Take 50 mg by mouth daily.    Marland Kitchen  ADULT MULTIVITAMIN W/MINERALS CH Oral Take 1 tablet by mouth daily.    Marland Kitchen OMEPRAZOLE 20 MG PO CPDR      . SIMVASTATIN 20 MG PO TABS      . VALSARTAN 80 MG PO TABS Oral Take 80 mg by mouth daily.    Marland Kitchen MAGNESIUM OXIDE 400 MG PO TABS Oral Take 1 tablet (400 mg total) by mouth 2 (two) times daily. 28 tablet 0    BP 136/108  Temp(Src) 97.4 F (36.3 C) (Oral)  Resp 16  SpO2 100%  Physical Exam  Nursing note and vitals reviewed. Constitutional: She is oriented to person, place, and time. She appears well-developed and well-nourished. No distress.  HENT:  Head:  Normocephalic and atraumatic.  Mouth/Throat: Oropharynx is clear and moist. No oropharyngeal exudate.  Eyes: Conjunctivae and EOM are normal. Pupils are equal, round, and reactive to light.  Neck: Normal range of motion. Neck supple.       No cervical tenderness  Cardiovascular: Normal rate, regular rhythm, normal heart sounds and intact distal pulses.  Exam reveals no gallop and no friction rub.   No murmur heard. Pulmonary/Chest: Effort normal and breath sounds normal. No respiratory distress. She exhibits no tenderness.  Abdominal: Soft. Bowel sounds are normal. There is no tenderness.  Musculoskeletal: Normal range of motion. She exhibits no tenderness.  Neurological: She is alert and oriented to person, place, and time. She has normal strength. She displays normal reflexes. No cranial nerve deficit or sensory deficit.  Skin: Skin is warm and dry. No rash noted.    ED Course  Procedures (including critical care time)  Labs Reviewed - No data to display Ct Head Wo Contrast  09/24/2011  *RADIOLOGY REPORT*  Clinical Data: Headache and visual changes; hypertension.  CT HEAD WITHOUT CONTRAST  Technique:  Contiguous axial images were obtained from the base of the skull through the vertex without contrast.  Comparison: CT of the head performed 03/04/2011  Findings: There is no evidence of acute infarction, mass lesion, or intra- or extra-axial hemorrhage on CT.  A chronic lacunar infarct is noted within the left cerebellar hemisphere.  The brainstem and fourth ventricle are within normal limits.  The third and lateral ventricles, and basal ganglia are unremarkable in appearance.  Cavum septum pellucidum is noted.  The cerebral hemispheres are symmetric in appearance, with normal gray-white differentiation.  No mass effect or midline shift is seen.  There is no evidence of fracture; visualized osseous structures are unremarkable in appearance.  The visualized portions of the orbits are within normal  limits.  The paranasal sinuses and mastoid air cells are well-aerated.  No significant soft tissue abnormalities are seen.  IMPRESSION:  1.  No acute intracranial pathology seen on CT. 2.  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  Original Report Authenticated By: Santa Lighter, [name]     1. Cephalalgia       MDM  Headache with migrainous components. Headache resolved after a headache cocktail. She had resolution of all of her symptoms. Visual acuity is normal. Head CT is negative. This was performed as she has no significant history of headaches in the past. I have low concern about a malignant cause of headache such as subarachnoid hemorrhage or meningitis. She has no nuchal rigidity. An LP is not indicated at this time as the headache was gradual in onset. She'll be discharged home with instructions to followup with her primary care physician.        [name] [name]  Edison Pace, MD 09/24/11 [phone]

## 2011-09-24 NOTE — ED Notes (Signed)
Patient with headache, gradual onset for last day, increasing in pain and intensity, sharp and sudden pain in last 30 minutes.  Patient is CAOx3, stroke screen negative, but vision changes in right eye.  Patient is hypertensive.

## 2011-09-24 NOTE — ED Notes (Signed)
Patient complaining of being anxious, not feeling right.  Patient states she is itching all over.  MD notified.  New order per Dr Edison Pace.

## 2011-09-24 NOTE — Discharge Instructions (Signed)

## 2011-09-24 NOTE — Progress Notes (Signed)
  Subjective:    Patient ID: [name] [name], female    DOB: 03/03/1951, 61 y.o.   MRN: [id number]  HPI I have reviewed this visit and discussed with Lamont Dowdy and agree with her documentation.      Review of Systems     Objective:   Physical Exam        Assessment & Plan:

## 2011-09-30 ENCOUNTER — Ambulatory Visit
Admission: RE | Admit: 2011-09-30 | Discharge: 2011-09-30 | Disposition: A | Discharge status: Home / Self Care | Payer: Medicare Other | Source: Ambulatory Visit | Attending: Family Medicine | Admitting: Family Medicine

## 2011-09-30 DIAGNOSIS — Z780 Asymptomatic menopausal state: Secondary | ICD-10-CM

## 2011-10-05 ENCOUNTER — Encounter: Payer: Self-pay | Admitting: Family Medicine

## 2011-10-05 DIAGNOSIS — M8580 Other specified disorders of bone density and structure, unspecified site: Secondary | ICD-10-CM | POA: Insufficient documentation

## 2011-10-07 ENCOUNTER — Encounter: Payer: Self-pay | Admitting: Family Medicine

## 2011-10-07 ENCOUNTER — Ambulatory Visit (INDEPENDENT_AMBULATORY_CARE_PROVIDER_SITE_OTHER): Payer: Medicare Other | Admitting: Family Medicine

## 2011-10-07 VITALS — BP 118/66 | HR 88 | Temp 98.1°F | Ht 66.0 in | Wt 155.0 lb

## 2011-10-07 DIAGNOSIS — M899 Disorder of bone, unspecified: Secondary | ICD-10-CM

## 2011-10-07 DIAGNOSIS — N183 Chronic kidney disease, stage 3 unspecified: Secondary | ICD-10-CM

## 2011-10-07 DIAGNOSIS — I5022 Chronic systolic (congestive) heart failure: Secondary | ICD-10-CM

## 2011-10-07 DIAGNOSIS — IMO0002 Reserved for concepts with insufficient information to code with codable children: Secondary | ICD-10-CM

## 2011-10-07 DIAGNOSIS — I509 Heart failure, unspecified: Secondary | ICD-10-CM

## 2011-10-07 DIAGNOSIS — M8580 Other specified disorders of bone density and structure, unspecified site: Secondary | ICD-10-CM

## 2011-10-07 DIAGNOSIS — R6889 Other general symptoms and signs: Secondary | ICD-10-CM

## 2011-10-07 LAB — BASIC METABOLIC PANEL
BUN: 20 mg/dL (ref 6–23)
Chloride: 111 mEq/L (ref 96–112)
Glucose, Bld: 90 mg/dL (ref 70–99)
Potassium: 4.5 mEq/L (ref 3.5–5.3)

## 2011-10-07 NOTE — Patient Instructions (Addendum)
With your heart problem, you need to avoid salt and sodium.   You should not get any more than 2000 mg of sodium or per day.   Get calcium with vitamin D tabs either 500 mg or 600 mg. Take one of those two times per day. You will need another pap smear in March of 2014.   I will call with your blood work results.

## 2011-10-08 MED ORDER — CALCIUM CARBONATE-VITAMIN D 500-200 MG-UNIT PO TABS: 1.0000 | ORAL_TABLET | Freq: Two times a day (BID) | ORAL | Status: AC

## 2011-10-08 NOTE — Assessment & Plan Note (Signed)
Informed repeat in one year.

## 2011-10-08 NOTE — Assessment & Plan Note (Signed)
recheck

## 2011-10-08 NOTE — Assessment & Plan Note (Signed)
Clinically well control not on diuretic

## 2011-10-08 NOTE — Progress Notes (Signed)
  Subjective:    Patient ID: [name] [name], female    DOB: January 22, 1951, 61 y.o.   MRN: [id number]  HPI  FU several issues ARF - doing well.  The good news is she has much less diarrhea from her ostomy.  That seems back to normal CHF.  No swelling or SOB.  Plans repeat echo per cards in June Hypo mag.  Completed my rx.  Hopefully will maintain with her resolved diarrhea Osteopenia. Given results of bone density Abnormal pap results discussed    Review of Systems     Objective:   Physical Exam Lungs clear Cardiac RRR Ext no edema       Assessment & Plan:

## 2011-10-09 ENCOUNTER — Encounter (INDEPENDENT_AMBULATORY_CARE_PROVIDER_SITE_OTHER): Payer: Self-pay

## 2011-11-17 ENCOUNTER — Other Ambulatory Visit: Payer: Self-pay | Admitting: Family Medicine

## 2011-11-17 DIAGNOSIS — F419 Anxiety disorder, unspecified: Secondary | ICD-10-CM

## 2011-11-17 MED ORDER — CITALOPRAM HYDROBROMIDE 10 MG PO TABS: 10.0000 mg | ORAL_TABLET | Freq: Every day | ORAL | Status: DC

## 2011-11-17 NOTE — Assessment & Plan Note (Signed)
Refill citalopram per fax request.

## 2011-12-01 ENCOUNTER — Ambulatory Visit (INDEPENDENT_AMBULATORY_CARE_PROVIDER_SITE_OTHER): Payer: Medicare Other | Admitting: Family Medicine

## 2011-12-01 ENCOUNTER — Encounter: Payer: Self-pay | Admitting: Family Medicine

## 2011-12-01 VITALS — BP 132/82 | HR 84 | Temp 98.4°F | Ht 66.0 in | Wt 160.2 lb

## 2011-12-01 DIAGNOSIS — F411 Generalized anxiety disorder: Secondary | ICD-10-CM

## 2011-12-01 DIAGNOSIS — F419 Anxiety disorder, unspecified: Secondary | ICD-10-CM

## 2011-12-01 LAB — CBC WITH DIFFERENTIAL/PLATELET
Basophils Absolute: 0 K/uL (ref 0.0–0.1)
Basophils Relative: 0 % (ref 0–1)
Eosinophils Absolute: 0.1 K/uL (ref 0.0–0.7)
Eosinophils Relative: 2 % (ref 0–5)
HCT: 33.3 % — ABNORMAL LOW (ref 36.0–46.0)
Hemoglobin: 11.3 g/dL — ABNORMAL LOW (ref 12.0–15.0)
Lymphocytes Relative: 46 % (ref 12–46)
Lymphs Abs: 2.7 K/uL (ref 0.7–4.0)
MCH: 30.6 pg (ref 26.0–34.0)
MCHC: 33.9 g/dL (ref 30.0–36.0)
MCV: 90.2 fL (ref 78.0–100.0)
Monocytes Absolute: 0.6 K/uL (ref 0.1–1.0)
Monocytes Relative: 10 % (ref 3–12)
Neutro Abs: 2.5 K/uL (ref 1.7–7.7)
Neutrophils Relative %: 42 % — ABNORMAL LOW (ref 43–77)
Platelets: 195 K/uL (ref 150–400)
RBC: 3.69 MIL/uL — ABNORMAL LOW (ref 3.87–5.11)
RDW: 14.4 % (ref 11.5–15.5)
WBC: 5.9 K/uL (ref 4.0–10.5)

## 2011-12-01 LAB — RENAL FUNCTION PANEL
Albumin: 4.6 g/dL (ref 3.5–5.2)
BUN: 18 mg/dL (ref 6–23)
CO2: 18 meq/L — ABNORMAL LOW (ref 19–32)
Calcium: 9.6 mg/dL (ref 8.4–10.5)
Chloride: 111 meq/L (ref 96–112)
Creat: 1.65 mg/dL — ABNORMAL HIGH (ref 0.50–1.10)
Glucose, Bld: 84 mg/dL (ref 70–99)
Phosphorus: 2.6 mg/dL (ref 2.3–4.6)
Potassium: 4.2 meq/L (ref 3.5–5.3)
Sodium: 138 meq/L (ref 135–145)

## 2011-12-01 LAB — MAGNESIUM: Magnesium: 1.4 mg/dL — ABNORMAL LOW (ref 1.5–2.5)

## 2011-12-01 LAB — TSH: TSH: 1.454 u[IU]/mL (ref 0.350–4.500)

## 2011-12-01 MED ORDER — CITALOPRAM HYDROBROMIDE 20 MG PO TABS: 20.0000 mg | ORAL_TABLET | Freq: Every day | ORAL | Status: DC

## 2011-12-01 NOTE — Patient Instructions (Signed)
Increase celexa to 20 mg daily to help prevent panic attacks  Please contact Dr. Gwenlyn Saran, clinical psychologist, to set up an appointment.  She can be reached at [phone].    Will send you a letter with normal bloodwork results.

## 2011-12-01 NOTE — Assessment & Plan Note (Signed)
Will increase celexa from 10 mg to 20 mg.  No change in ativan.  Referral to Dr. Gwenlyn Saran for CBT.  Patient is motivated and asked for referral.

## 2011-12-01 NOTE — Progress Notes (Signed)
  Subjective:    Patient ID: [name] [name], female    DOB: 23-Jun-1951, 61 y.o.   MRN: [id number]  HPI Here for work-in appt for panic attacks  States has been worsening, went to [facility] last week for it, states EKG was normal- I am not able to see this visit int [facility].  Previously diagnosed- treated by PCP with Celexa 10 mg and ativan for it.  Last episode while she was driving.  States her breathing becomes very fas and feels overwhelmed.  Last episode lasted for 20 minutes.  Now occuring more than once daily.  Ativan helps, but she does not want to keep taking it and wants to know why she is having worsening panic.  Does not identify any worsening stressors in her life.  Notes fatigue.  Would like to have counseling.  Has not been in therapy in the past.  I have reviewed patient's  PMH, FH, and Social history and Medications as related to this visit.   Review of Systems  no chest pain, fever, weight loss.  No further diarrhea.  No pain.    Objective:   Physical Exam  GEN: Alert & Oriented, No acute distress CV:  Regular Rate & Rhythm, no murmur Respiratory:  Normal work of breathing, CTAB Abd:  + BS, soft, no tenderness to palpation Ext: no pre-tibial edema Psych:  Affect normal.  Alert and oriented.      Assessment & Plan:

## 2011-12-01 NOTE — Progress Notes (Addendum)
Patient ID: [name] [name], female   DOB: [birth year], 61 y.o.   MRN: [id number] Reviewed and agree with Dr. Lanae Crumbly management.  Appreciate her excellent care of my patient.

## 2011-12-01 NOTE — Assessment & Plan Note (Addendum)
Will check electrolytes for gross abnormalities that could be causing fatigue, increased panic, has history of hyperparathyroidism in Feb 2013 during hospitalization with acute renal failure.

## 2011-12-02 ENCOUNTER — Encounter: Payer: Self-pay | Admitting: Family Medicine

## 2011-12-02 ENCOUNTER — Telehealth: Payer: Self-pay | Admitting: Family Medicine

## 2011-12-02 NOTE — Telephone Encounter (Signed)
Called to discuss lab results with patient

## 2011-12-03 ENCOUNTER — Encounter: Payer: Self-pay | Admitting: Family Medicine

## 2011-12-03 NOTE — Telephone Encounter (Signed)
Unable to reach patient after several calls.  Will send letter asking her to schedule follow-up with primary care physician to discuss labwork.  Vit D and hgb low in setting in of CKD with secondary hyperparathyroidism Magnesium borderline

## 2011-12-07 ENCOUNTER — Ambulatory Visit (HOSPITAL_COMMUNITY): Payer: Medicare Other | Attending: Cardiovascular Disease

## 2011-12-07 DIAGNOSIS — R0989 Other specified symptoms and signs involving the circulatory and respiratory systems: Secondary | ICD-10-CM | POA: Insufficient documentation

## 2011-12-07 DIAGNOSIS — I509 Heart failure, unspecified: Secondary | ICD-10-CM

## 2011-12-07 DIAGNOSIS — I5022 Chronic systolic (congestive) heart failure: Secondary | ICD-10-CM

## 2011-12-07 DIAGNOSIS — Z87891 Personal history of nicotine dependence: Secondary | ICD-10-CM | POA: Insufficient documentation

## 2011-12-07 DIAGNOSIS — N289 Disorder of kidney and ureter, unspecified: Secondary | ICD-10-CM | POA: Insufficient documentation

## 2011-12-07 DIAGNOSIS — E785 Hyperlipidemia, unspecified: Secondary | ICD-10-CM | POA: Insufficient documentation

## 2011-12-07 DIAGNOSIS — I10 Essential (primary) hypertension: Secondary | ICD-10-CM | POA: Insufficient documentation

## 2011-12-07 DIAGNOSIS — I059 Rheumatic mitral valve disease, unspecified: Secondary | ICD-10-CM | POA: Insufficient documentation

## 2011-12-07 DIAGNOSIS — R0609 Other forms of dyspnea: Secondary | ICD-10-CM | POA: Insufficient documentation

## 2011-12-07 NOTE — Progress Notes (Signed)
Echocardiogram performed.  

## 2011-12-10 NOTE — Progress Notes (Signed)
lmtcb

## 2011-12-11 ENCOUNTER — Encounter: Payer: Self-pay | Admitting: Cardiovascular Disease

## 2011-12-11 ENCOUNTER — Encounter: Payer: Self-pay | Admitting: Family Medicine

## 2011-12-11 ENCOUNTER — Ambulatory Visit (INDEPENDENT_AMBULATORY_CARE_PROVIDER_SITE_OTHER): Payer: Medicare Other | Admitting: Cardiovascular Disease

## 2011-12-11 VITALS — BP 130/74 | HR 82 | Resp 18 | Ht 66.0 in | Wt 161.8 lb

## 2011-12-11 DIAGNOSIS — I509 Heart failure, unspecified: Secondary | ICD-10-CM

## 2011-12-11 DIAGNOSIS — I5022 Chronic systolic (congestive) heart failure: Secondary | ICD-10-CM

## 2011-12-11 NOTE — Assessment & Plan Note (Signed)
[name] [name] is  doing very well. Her left ventricular function has normalized. She's on Diovan and metoprolol. We will have her continue to followup with her family practice doctor. I'll see her on an as-needed basis. I have advised her to avoid needing any extra salt.

## 2011-12-11 NOTE — Patient Instructions (Signed)
FOLLOW UP WITH PCP/ DR HENSEL  REDUCE HIGH SODIUM FOODS LIKE CANNED SOUP, GRAVY, SAUCES, READY PREPARED FOODS LIKE FROZEN FOODS; LEAN CUISINE, LASAGNA. BACON, SAUSAGE, LUNCH MEAT, FAST FOODS.Marland Kitchen   Your physician recommends that you schedule a follow-up appointment in: AS NEEDED BASIS

## 2011-12-11 NOTE — Progress Notes (Signed)
[name] Date of Birth  1950-11-27  [facility] [address]    [place], [place]  [address] [phone]  Fax  [phone]  Problems: 1. Congestive heart failure-EF of 30-35%,  EF has now improved Left ventricle: 12/07/2011-- The cavity size was normal. Wall thickness was normal. Systolic function was normal. The estimated ejection fraction was in the range of 60% to 65%. - Aortic valve: Trivial regurgitation. - Mitral valve: Moderate regurgitation.  normal myoview in May , 2012 1. Chronic renal insufficiency 2. Chronic anemia  History of Present Illness:  This is a 61 year old female with a  diagnosis of congestive heart failure.   She's been on medical therapy and her ejection fraction has increased from 35% up to 60-65%.  She has done well from a medical standpoint.  No dyspnea. No CP.   Current Outpatient Prescriptions on File Prior to Visit  Medication Sig Dispense Refill  . acetaminophen (TYLENOL) 500 MG tablet Take 1,000 mg by mouth at bedtime as needed. For pain      . calcium-vitamin D (OSCAL 500/200 D-3) 500-200 MG-UNIT per tablet Take 1 tablet by mouth 2 (two) times daily.      . citalopram (CELEXA) 20 MG tablet Take 1 tablet (20 mg total) by mouth daily.  30 tablet  5  . isosorbide mononitrate (IMDUR) 30 MG 24 hr tablet Take 1 tablet (30 mg total) by mouth at bedtime.  90 tablet  3  . LORazepam (ATIVAN) 0.5 MG tablet Take 1 tablet (0.5 mg total) by mouth 2 (two) times daily. For anxiety  60 tablet  5  . mesalamine (ASACOL) 400 MG EC tablet Take 1,200 mg by mouth 2 (two) times daily. 3 tabs twice daily      . metoprolol succinate (TOPROL-XL) 50 MG 24 hr tablet Take 50 mg by mouth daily.      . Multiple Vitamin (MULITIVITAMIN WITH MINERALS) TABS Take 1 tablet by mouth daily.      Marland Kitchen omeprazole (PRILOSEC) 20 MG capsule       . simvastatin (ZOCOR) 20 MG tablet       . valsartan (DIOVAN) 80 MG tablet Take 80 mg by mouth daily.        Allergies    Allergen Reactions  . Amoxicillin Anaphylaxis and Rash    REACTION:Throat Swelling  . Penicillins Anaphylaxis and Rash  . Ace Inhibitors Cough    ACE possibly associated with cough and switched to ARB.  Would be OK to re-challenge if needed.  . Gabapentin     Patient had one time seizure shortly after stopping gabapentin  . Aspirin Itching and Rash    REACTION: Rash only.  NO breathing issues with use.   Able to tolerate ASACOL    Past Medical History  Diagnosis Date  . Crohn disease   . Hypertension   . Hyperlipidemia   . CHF (congestive heart failure)     Past Surgical History  Procedure Date  . Cholecystectomy, laparoscopic 01/28/05  . 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 Salpingectomy     left    History  Smoking status  . Former Smoker -- 0.5 packs/day for 18 years  . Types: Cigarettes  . Quit date: 05/10/2011  Smokeless tobacco  . Never Used    History  Alcohol Use No    Family History  Problem Relation Age of Onset  . Heart disease Mother   . Stroke Father     Reviw of  Systems:  Reviewed in the HPI.  All other systems are negative.  Physical Exam: BP 130/74  Pulse 82  Resp 18  Ht 5' 6"  (1.676 m)  Wt 161 lb 12.8 oz (73.392 kg)  BMI 26.12 kg/m2 The patient is alert and oriented x 3.  The mood and affect are normal.   Skin: warm and dry.  Color is normal.    HEENT:   Normocephalic/atraumatic. She has normal carotids. There is no JVD.  Lungs: Lung exam is clear.   Heart: Regular rate S1-S2.    Abdomen: She has colostomy on her left side.  +BS. nontender  Extremities:  She has no clubbing cyanosis or edema.  Neuro:  There exam is nonfocal.    ECG:  Assessment / Plan:

## 2011-12-16 ENCOUNTER — Emergency Department (HOSPITAL_COMMUNITY): Payer: Medicare Other

## 2011-12-16 ENCOUNTER — Encounter (HOSPITAL_COMMUNITY): Payer: Self-pay | Admitting: *Deleted

## 2011-12-16 ENCOUNTER — Emergency Department (HOSPITAL_COMMUNITY)
Admission: EM | Admit: 2011-12-16 | Discharge: 2011-12-16 | Disposition: A | Discharge status: Home / Self Care | Payer: Medicare Other | Attending: Emergency Medicine | Admitting: Emergency Medicine

## 2011-12-16 DIAGNOSIS — Z933 Colostomy status: Secondary | ICD-10-CM | POA: Insufficient documentation

## 2011-12-16 DIAGNOSIS — I10 Essential (primary) hypertension: Secondary | ICD-10-CM | POA: Insufficient documentation

## 2011-12-16 DIAGNOSIS — Z79899 Other long term (current) drug therapy: Secondary | ICD-10-CM | POA: Insufficient documentation

## 2011-12-16 DIAGNOSIS — R0602 Shortness of breath: Secondary | ICD-10-CM | POA: Insufficient documentation

## 2011-12-16 DIAGNOSIS — E785 Hyperlipidemia, unspecified: Secondary | ICD-10-CM | POA: Insufficient documentation

## 2011-12-16 DIAGNOSIS — R079 Chest pain, unspecified: Secondary | ICD-10-CM | POA: Insufficient documentation

## 2011-12-16 DIAGNOSIS — R110 Nausea: Secondary | ICD-10-CM | POA: Insufficient documentation

## 2011-12-16 HISTORY — DX: Chronic kidney disease, stage 3 (moderate): N18.3

## 2011-12-16 HISTORY — DX: Personal history of other diseases of the circulatory system: Z86.79

## 2011-12-16 HISTORY — DX: Anxiety disorder, unspecified: F41.9

## 2011-12-16 HISTORY — DX: Chronic kidney disease, stage 3 unspecified: N18.30

## 2011-12-16 HISTORY — DX: Anemia in chronic kidney disease: D63.1

## 2011-12-16 HISTORY — DX: Panic disorder (episodic paroxysmal anxiety): F41.0

## 2011-12-16 HISTORY — DX: Gastro-esophageal reflux disease without esophagitis: K21.9

## 2011-12-16 HISTORY — DX: Anemia in chronic kidney disease: N18.9

## 2011-12-16 LAB — URINALYSIS, ROUTINE W REFLEX MICROSCOPIC
Ketones, ur: NEGATIVE mg/dL
Nitrite: NEGATIVE
Protein, ur: NEGATIVE mg/dL

## 2011-12-16 LAB — COMPREHENSIVE METABOLIC PANEL
AST: 27 U/L (ref 0–37)
Albumin: 4 g/dL (ref 3.5–5.2)
BUN: 26 mg/dL — ABNORMAL HIGH (ref 6–23)
Calcium: 9.9 mg/dL (ref 8.4–10.5)
Creatinine, Ser: 1.67 mg/dL — ABNORMAL HIGH (ref 0.50–1.10)
Total Protein: 8.1 g/dL (ref 6.0–8.3)

## 2011-12-16 LAB — CARDIAC PANEL(CRET KIN+CKTOT+MB+TROPI)
CK, MB: 3.1 ng/mL (ref 0.3–4.0)
Total CK: 194 U/L — ABNORMAL HIGH (ref 7–177)

## 2011-12-16 LAB — URINE MICROSCOPIC-ADD ON

## 2011-12-16 LAB — CBC
Hemoglobin: 11 g/dL — ABNORMAL LOW (ref 12.0–15.0)
MCH: 31.1 pg (ref 26.0–34.0)
MCHC: 33.5 g/dL (ref 30.0–36.0)
Platelets: 185 10*3/uL (ref 150–400)
RBC: 3.54 MIL/uL — ABNORMAL LOW (ref 3.87–5.11)

## 2011-12-16 LAB — PRO B NATRIURETIC PEPTIDE: Pro B Natriuretic peptide (BNP): 184.1 pg/mL — ABNORMAL HIGH (ref 0–125)

## 2011-12-16 MED ORDER — NITROGLYCERIN 0.4 MG SL SUBL: 0.4000 mg | SUBLINGUAL_TABLET | SUBLINGUAL | Status: DC | PRN

## 2011-12-16 NOTE — ED Provider Notes (Signed)
History     CSN: [id number]  Arrival date & time 12/16/11  6834   First MD Initiated Contact with Patient 12/16/11 0606      Chief Complaint  Patient presents with  . Chest Pain    (Consider location/radiation/quality/duration/timing/severity/associated sxs/prior treatment) HPI Comments: Patient is a 61 year old female with a history of hyperlipidemia, hypertension, CHF, renal insufficiency and Crohn's disease that presents emergency department with chief complaint of chest pain.  Onset of symptoms began at 4 a.m. this morning, pain awakening patient from sleep.  Location of pain is substernal and lasted constantly for about 35 minutes.  The pain was described as a burning sensation and was associated with nausea, shortness of breath, dyspnea on exertion, PND, a 3 pillow orthopnea, and cough times one week.  Patient states that she got feeling she couldn't breathe right and became extremely anxious.  Patient took her lorazepam for anxiety which did help resolve symptoms.  Chest pain radiated to neck and left arm that she described as a tingling sensation.  Pain was rated as a 7/10 but is currently pain-free.  Patient denies associated symptoms including leg swelling, diaphoresis, emesis, fever, night sweats, chills, palpitations, claudication, syncope.  Patient has no other complaints at this time.  2 D Echo, 12/07/2011 (Dr. Cathie Olden, [facility]) Study Conclusions - Left ventricle: The cavity size was normal. Wall thickness   was normal. Systolic function was normal. The estimated   ejection fraction was in the range of 60% to 65%. - Aortic valve: Trivial regurgitation. - Mitral valve: Moderate regurgitation.  Renal artery Duplex, 08/28/2011  - Right= No evidence of renal artery stenosis.  - Left= 60% stenosis in the proximal and mid segments and 80%     stenosis in the distal segment. RAR= 5.13.  - Bilateral normal intrarenal RI.   Patient is a 61 y.o. female presenting  with chest pain.  Chest Pain Primary symptoms include shortness of breath and nausea. Pertinent negatives for primary symptoms include no fever, no fatigue, no cough, no wheezing, no palpitations, no abdominal pain, no vomiting and no dizziness.  Pertinent negatives for associated symptoms include no diaphoresis.     Past Medical History  Diagnosis Date  . Crohn disease   . Hypertension   . Hyperlipidemia   . CHF (congestive heart failure)   . GERD (gastroesophageal reflux disease)     Past Surgical History  Procedure Date  . Cholecystectomy, laparoscopic 01/28/05  . 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 Salpingectomy     left    Family History  Problem Relation Age of Onset  . Heart disease Mother   . Stroke Father     History  Substance Use Topics  . Smoking status: Former Smoker -- 0.5 packs/day for 18 years    Types: Cigarettes    Quit date: 05/10/2011  . Smokeless tobacco: Never Used  . Alcohol Use: No    OB History    Grav Para Term Preterm Abortions TAB SAB Ect Mult Living                  Review of Systems  Constitutional: Positive for activity change. Negative for fever, chills, diaphoresis, fatigue and unexpected weight change.  HENT: Negative for congestion, neck pain and neck stiffness.   Eyes: Negative for visual disturbance.  Respiratory: Positive for shortness of breath. Negative for apnea, cough, chest tightness, wheezing and stridor.   Cardiovascular: Positive for chest pain. Negative for palpitations and leg swelling.  Gastrointestinal: Positive for nausea. Negative for vomiting, abdominal pain, diarrhea and blood in stool.  Genitourinary: Negative for dysuria, urgency, hematuria and flank pain.  Musculoskeletal: Negative for myalgias, back pain and gait problem.  Skin: Negative for pallor.  Neurological: Negative for dizziness, syncope, light-headedness and headaches.  All other systems reviewed and are negative.    Allergies  Amoxicillin;  Penicillins; Ace inhibitors; Gabapentin; and Aspirin  Home Medications   Current Outpatient Rx  Name Route Sig Dispense Refill  . ACETAMINOPHEN 500 MG PO TABS Oral Take 1,000 mg by mouth at bedtime as needed. For pain    . CALCIUM CARBONATE-VITAMIN D 500-200 MG-UNIT PO TABS Oral Take 1 tablet by mouth 2 (two) times daily.    Marland Kitchen CITALOPRAM HYDROBROMIDE 20 MG PO TABS Oral Take 1 tablet (20 mg total) by mouth daily. 30 tablet 5  . ISOSORBIDE MONONITRATE ER 30 MG PO TB24 Oral Take 1 tablet (30 mg total) by mouth at bedtime. 90 tablet 3  . LORAZEPAM 0.5 MG PO TABS Oral Take 1 tablet (0.5 mg total) by mouth 2 (two) times daily. For anxiety 60 tablet 5  . MESALAMINE 400 MG PO TBEC Oral Take 1,200 mg by mouth 2 (two) times daily. 3 tabs twice daily    . METOPROLOL SUCCINATE ER 50 MG PO TB24 Oral Take 50 mg by mouth daily.    . ADULT MULTIVITAMIN W/MINERALS CH Oral Take 1 tablet by mouth daily.    Marland Kitchen OMEPRAZOLE 20 MG PO CPDR Oral Take 20 mg by mouth daily.     Marland Kitchen SIMVASTATIN 20 MG PO TABS Oral Take 20 mg by mouth at bedtime.     Marland Kitchen VALSARTAN 80 MG PO TABS Oral Take 80 mg by mouth daily.      BP 138/66  Pulse 83  Temp 98.2 F (36.8 C) (Oral)  Resp 18  SpO2 98%  Physical Exam  Nursing note and vitals reviewed. Constitutional: She appears well-developed and well-nourished. No distress.  HENT:  Head: Normocephalic and atraumatic.  Eyes: Conjunctivae and EOM are normal. Pupils are equal, round, and reactive to light.  Neck: Normal range of motion. Neck supple. Normal carotid pulses and no JVD present. Carotid bruit is not present. No rigidity. Normal range of motion present.  Cardiovascular: Normal rate, regular rhythm, S1 normal, S2 normal, normal heart sounds, intact distal pulses and normal pulses.  Exam reveals no gallop and no friction rub.   No murmur heard.      No pitting edema bilaterally, RRR, no aberrant sounds on auscultations, distal pulses intact, no carotid bruit or JVD.     Pulmonary/Chest: No accessory muscle usage or stridor. She exhibits no bony tenderness.       Effort normal, LCAB  Abdominal: Bowel sounds are normal.       Soft non tender. Non pulsatile aorta. Colostomy in place without warmth or erythema.   Skin: Skin is warm, dry and intact. No rash noted. She is not diaphoretic. No cyanosis. Nails show no clubbing.    ED Course  Procedures (including critical care time)  Labs Reviewed  CBC - Abnormal; Notable for the following:    RBC 3.54 (*)     Hemoglobin 11.0 (*)     HCT 32.8 (*)     All other components within normal limits  URINALYSIS, ROUTINE W REFLEX MICROSCOPIC - Abnormal; Notable for the following:    Leukocytes, UA SMALL (*)     All other components within normal limits  PRO B  NATRIURETIC PEPTIDE - Abnormal; Notable for the following:    Pro B Natriuretic peptide (BNP) 184.1 (*)     All other components within normal limits  COMPREHENSIVE METABOLIC PANEL - Abnormal; Notable for the following:    CO2 18 (*)     BUN 26 (*)     Creatinine, Ser 1.67 (*)     ALT 39 (*)     Alkaline Phosphatase 157 (*)     GFR calc non Af Amer 33 (*)     GFR calc Af Amer 38 (*)     All other components within normal limits  URINE MICROSCOPIC-ADD ON - Abnormal; Notable for the following:    Squamous Epithelial / LPF FEW (*)     All other components within normal limits  POCT I-STAT TROPONIN I   Dg Chest Portable 1 View  12/16/2011  *RADIOLOGY REPORT*  Clinical Data: Chest pain, shortness of breath.  PORTABLE CHEST - 1 VIEW  Comparison: 07/11/2011  Findings: Hyperinflation with coarse interstitial markings. Mild left greater than right lung base opacity.  Heart size upper normal.  Unchanged mediastinal contours.  No pleural effusion.  No focal consolidation.  No pneumothorax.  No acute osseous finding.  IMPRESSION: COPD changes.  Mild left the right lung base opacity; atelectasis versus infiltrate.  Original Report Authenticated By: Suanne Marker,  [name]     No diagnosis found.    Date: 12/16/2011  Rate: 73  Rhythm: normal sinus rhythm  QRS Axis: normal  Intervals: normal  ST/T Wave abnormalities: normal  Conduction Disutrbances: none  Narrative Interpretation:   Old EKG Reviewed: No significant changes noted    MDM  Chest pain  Patient is to be discharged per recommendation of [facility] cardiology. Follow up has been scheduled for [facility] for OP stress test. Chest pain with low  likely cardiac or pulmonary etiology d/t presentation, perc negative, VSS, no tracheal deviation, no JVD or new murmur, RRR, breath sounds equal bilaterally, EKG without acute abnormalities, negative troponin x 2, and negative CXR. Pt has been advised to return to the ED is CP becomes exertional, associated with diaphoresis or nausea, radiates to left jaw/arm, worsens or becomes concerning in any way. Pt appears reliable for follow up and is agreeable to discharge.   Case has been discussed with and seen by Dr. Roderic Palau who agrees with the above plan to discharge.          Verl Dicker, [place] 12/16/11 1148

## 2011-12-16 NOTE — ED Notes (Signed)
Cardiology at bedside.

## 2011-12-16 NOTE — Discharge Instructions (Signed)
*   No food or drink after midnight tonight in preparation for your stress test tomorrow (12/17/11).  * No caffeine on 12/17/11 in preparation for your stress test on 12/17/11.  * Don't take your metoprolol (Toprol-XL) the morning of the stress test

## 2011-12-16 NOTE — Consult Note (Signed)
Consult Note   Patient ID: [name] [name] MRN: [id number], DOB/AGE: Nov 05, 1950   Admit date: 12/16/2011 Date of Consult: 12/16/2011  Primary Physician: Zigmund Gottron, MD Primary Cardiologist: Liam Rogers, MD  Pt. 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   Reason for consult: evaluation/management of chest pain  Problem List: Past Medical History  Diagnosis Date  . Crohn disease   . Hypertension   . Hyperlipidemia   . CHF (congestive heart failure)   . GERD (gastroesophageal reflux disease)   . 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 Anxiety   . Panic attacks   . CKD (chronic kidney disease), stage III   . Anemia, chronic renal failure     Past Surgical History  Procedure Date  . Cholecystectomy, laparoscopic 01/28/05  . 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 Salpingectomy     left     Allergies:  Allergies  Allergen Reactions  . Amoxicillin Anaphylaxis and Rash    REACTION:Throat Swelling  . Penicillins Anaphylaxis and Rash  . Ace Inhibitors Cough    ACE possibly associated with cough and switched to ARB.  Would be OK to re-challenge if needed.  . Gabapentin     Patient had one time seizure shortly after stopping gabapentin  . Aspirin Itching and Rash    REACTION: Rash only.  NO breathing issues with use.   Able to tolerate ASACOL    PAST CARDIAC HISTORY  Lexiscan Myoview 11/08/10: no evidence for pharmacologically-induce ischemia; LVEF 62% 2D Echo 11/12 & 06/13: as above  HPI:   The patient was recently presented to the ED late last month with similar complaints of chest pain without an acute  cardiac etiology, and subsequently discharged from the ED. She has been having trouble with anxiety and panic attacks. She has been referred to a psychologist for counseling of this. Additionally, she recently followed up with Dr. Acie Fredrickson in the office on 12/11/11. She had been doing well from a clinical standpoint. Repeat echo revealed much improved EF to a normal range and moderate MR as compared to a previous echo in 05/2011 as noted in the patient profile.   This morning at 4 AM she awoke with a sharp, "pinching" SSCP acutely localized to her epigastrum/inferior sternum lasting for ~ 30 min, radiating her neck and left arm with associated diaphoresis, shortness of breath and lightheadedness. She attributed this to her anxiety, took a Xanax without relief. This was similar to her prior episode of chest pain last month. She denies experiencing this sharp-quality chest pain with prior panic attacks. Not aggravated by palpations, recumbency or inspiration. She reports a nonproductive cough (1 week). No similarity to reflux symptoms. Denies palpitations, n/v/d, abdominal, fevers, chills, sick contacts. Denies orthopnea, PND or LE swelling. No changes to ileostomy output. She denies progressive DOE or decrease in activity level.   Upon ED arrival, EKG is without ischemic changes. POC troponin-I WNL. CMET notable for increased Cr 1.67 (baseline 1.5-1.7), alk phos 157, ALT 39. CBC reveals decreased H/H at 11.0/32.8, respectively. pBNP very mildly elevated at 184.1. U/A unremarkable with small leuk's, negative nitrites, negative hgb. CXR reveals chronic changes consistent  with COPD. Mild left and right lung base opacities concerning for atelectasis vs infiltrate. VSS.   Home Medications: Prior to Admission medications   Medication Sig Start Date End Date Taking? Authorizing Provider  acetaminophen (TYLENOL) 500 MG tablet Take 1,000 mg by mouth at bedtime as needed. For pain   Yes Historical Provider, MD    calcium-vitamin D (OSCAL 500/200 D-3) 500-200 MG-UNIT per tablet Take 1 tablet by mouth 2 (two) times daily. 10/08/11 10/07/12 Yes Zigmund Gottron, MD  citalopram (CELEXA) 20 MG tablet Take 1 tablet (20 mg total) by mouth daily. 12/01/11 11/30/12 Yes Katherina Mires, MD  isosorbide mononitrate (IMDUR) 30 MG 24 hr tablet Take 1 tablet (30 mg total) by mouth at bedtime. 06/10/11 06/09/12 Yes Zigmund Gottron, MD  LORazepam (ATIVAN) 0.5 MG tablet Take 1 tablet (0.5 mg total) by mouth 2 (two) times daily. For anxiety 06/10/11  Yes Zigmund Gottron, MD  mesalamine (ASACOL) 400 MG EC tablet Take 1,200 mg by mouth 2 (two) times daily. 3 tabs twice daily   Yes Historical Provider, MD  metoprolol succinate (TOPROL-XL) 50 MG 24 hr tablet Take 50 mg by mouth daily. 07/15/11 07/14/12 Yes Zigmund Gottron, MD  Multiple Vitamin (MULITIVITAMIN WITH MINERALS) TABS Take 1 tablet by mouth daily.   Yes Historical Provider, MD  omeprazole (PRILOSEC) 20 MG capsule Take 20 mg by mouth daily.  06/22/11  Yes Zigmund Gottron, MD  simvastatin (ZOCOR) 20 MG tablet Take 20 mg by mouth at bedtime.  08/19/11  Yes Zigmund Gottron, MD  valsartan (DIOVAN) 80 MG tablet Take 80 mg by mouth daily. 07/15/11 07/14/12 Yes Zigmund Gottron, MD   Inpatient Medications:     (Not in a hospital admission)  Family History  Problem Relation Age of Onset  . Heart disease Mother   . Stroke Father      History   Social History  . Marital Status: Single    Spouse Name: N/A    Number of Children: 1  . Years of Education: associate   Occupational History  . Retired-cook [employer]   Social History Main Topics  . Smoking status: Former Smoker -- 0.5 packs/day for 18 years    Types: Cigarettes    Quit date: 05/10/2011  . Smokeless tobacco: Never Used  . Alcohol Use: No  . Drug Use: No  . Sexually Active: Yes   Other Topics Concern  . Not on file   Social History Narrative   Health Care POA:  Emergency Contact: partner, Raj Janus, [phone] of Life Plan: Who lives with you: granddaughter and partnerAny pets: cat- Wild ThingDiet: pt has a varied diet of protein, starch, and vegetables Exercise: Pt does not have any regular exercise routineSeatbelts:Pt reports wearing seatbelt when in vehicles. Sun Exposure/Protection: Hobbies: cooking, bingo, basketball    Review of Systems: General: negative for chills, fever, night sweats or weight changes.  Cardiovascular: positive for chest pain, shortness of breath, diaphoresis, negative for dyspnea on exertion, edema, orthopnea, palpitations, paroxysmal nocturnal dyspnea Dermatological: negative for rash Respiratory: positive for cough, negative for wheezing Urologic: negative for hematuria Abdominal: negative for nausea, vomiting, diarrhea, bright red blood per rectum, melena, or hematemesis Neurologic: positive for lightheadedness, negative for visual changes, syncope All other systems reviewed and are otherwise negative except as noted above.  Physical Exam: Blood pressure 138/66, pulse 83, temperature 98.2 F (36.8 C), temperature source Oral, resp. rate 18, SpO2 98.00%.    General: Well developed, well nourished,  in no acute distress. Head: Normocephalic, atraumatic, sclera non-icteric, no xanthomas, nares are without discharge Neck: Negative for carotid bruits. JVD not elevated. Lungs: Clear bilaterally to auscultation without wheezes, rales, or rhonchi. Breathing is unlabored. Heart: RRR with S1 S2. No murmurs, rubs, or gallops appreciated. Abdomen: Ileostomy bag with green output, no appreciable melena/hematochezia, nontender, soft, non-tender, non-distended with normoactive bowel sounds. No hepatomegaly. No rebound/guarding. No obvious abdominal masses. Msk:  Strength and tone appears normal for age. Extremities: No clubbing, cyanosis or edema.  Distal pedal pulses are 2+ and equal bilaterally. Neuro: Alert and oriented X 3.  Moves all extremities spontaneously. Psych:  Responds to questions appropriately with a normal affect.  Labs: Recent Labs  [facility] 12/16/11 0626   WBC 6.1   HGB 11.0*   HCT 32.8*   MCV 92.7   PLT 185    Lab 12/16/11 0626  NA 137  K 4.1  CL 107  CO2 18*  BUN 26*  CREATININE 1.67*  CALCIUM 9.9  PROT 8.1  BILITOT 0.3  ALKPHOS 157*  ALT 39*  AST 27  AMYLASE --  LIPASE --  GLUCOSE 92   Radiology/Studies: Dg Chest Portable 1 View  12/16/2011  *RADIOLOGY REPORT*  Clinical Data: Chest pain, shortness of breath.  PORTABLE CHEST - 1 VIEW  Comparison: 07/11/2011  Findings: Hyperinflation with coarse interstitial markings. Mild left greater than right lung base opacity.  Heart size upper normal.  Unchanged mediastinal contours.  No pleural effusion.  No focal consolidation.  No pneumothorax.  No acute osseous finding.  IMPRESSION: COPD changes.  Mild left the right lung base opacity; atelectasis versus infiltrate.  Original Report Authenticated By: Suanne Marker, [name]   EKG: NSR, 77 bpm, good R wave progression, no ST-T wave changes  ASSESSMENT:   1. Chest pain 2. Systolic CHF  - Acute in 09/4740 in setting of PNA, resolved per echo 12/07/2011 3. Anxiety/panic attacks 4. GERD 5. HTN 6. HL 7. CKD, stage III 8. Chronic anemia 9. Crohn's disease  DISCUSSION/PLAN:  Her chest pain is described with mostly atypical features. She was recently evaluated for chest pain several weeks ago, and ruled out. This was in some was similar to prior panic attacks, aside from the sharp epigastric pain. These episodes occur intermittently. She has not had progressive DOE or decrease in activity level. EKG is nonacute, POC troponin-I WNL. Chest pain free currently. Low-suspicion for cardiac etiology. This seems to be largely driven by the patient's anxiety and panic attacks. She has been scheduled to see a psychologist for further management.   She has improved EF, mild MR, no WMAs on recent  repeat echo last week, when compared to prior echo in 05/2011 revealing LVEF 30-35%, diffuse WMAs and diastolic dysfunction. This was in the setting of pneumonia. The patient does have a history of viral myocarditis in the past.  Question of infiltrates vs atelectasis on CXR. This is consistent with prior radiographs in which this finding was described as bibasilar compressive atelectasis. Lungs CTAB on exam. Afebrile without a leukocytosis.  Will get additional set of cardiac biomarkers, discharge from ED if normal. Schedule outpatient Lexiscan Myoview in the office later this week/next week. All other chronic medical conditions stable.   Signed, R. Valeria Batman, PA-C 12/16/2011, 10:14 AM   Patient seen and examined with Valeria Batman, PA-C. We discussed all aspects of the encounter. I agree with the assessment and plan as stated above. Previous ECGs and notes reviewed personally. CP atypical. No objective  evidence of ischemia by CEs or ECG. Pain now resolved. Will check 2nd set of CEs. If negative will arrange for outpatient Myoview to further evaluate given her previously LV function and other RFs. Discussed with ER team.  Benay Spice 11:58 AM

## 2011-12-16 NOTE — ED Notes (Signed)
Per EMS: pt was woke from sleep approx 1 hour ago with central CP, neck pain, and left arm tingling.  States has had before and it was anxiety, took her Lorazepam approx 30 min ago.  No pain at this time, pt drifting in and out of sleep.  Pain was associated with nausea initially.

## 2011-12-16 NOTE — ED Notes (Signed)
Pt states she had episode of CP in center of chest that radiated up both sides of her neck.  Pain resolved after taking rx for Ativan.  Was associated with nausea and left arm tingling.

## 2011-12-17 ENCOUNTER — Ambulatory Visit (HOSPITAL_COMMUNITY): Payer: Medicare Other | Attending: Cardiology | Admitting: Radiology

## 2011-12-17 VITALS — BP 130/69 | HR 74 | Ht 66.0 in | Wt 160.0 lb

## 2011-12-17 DIAGNOSIS — R079 Chest pain, unspecified: Secondary | ICD-10-CM

## 2011-12-17 DIAGNOSIS — R42 Dizziness and giddiness: Secondary | ICD-10-CM | POA: Insufficient documentation

## 2011-12-17 DIAGNOSIS — R000 Tachycardia, unspecified: Secondary | ICD-10-CM | POA: Insufficient documentation

## 2011-12-17 DIAGNOSIS — R61 Generalized hyperhidrosis: Secondary | ICD-10-CM | POA: Insufficient documentation

## 2011-12-17 DIAGNOSIS — R0789 Other chest pain: Secondary | ICD-10-CM | POA: Insufficient documentation

## 2011-12-17 DIAGNOSIS — I10 Essential (primary) hypertension: Secondary | ICD-10-CM | POA: Insufficient documentation

## 2011-12-17 DIAGNOSIS — E785 Hyperlipidemia, unspecified: Secondary | ICD-10-CM | POA: Insufficient documentation

## 2011-12-17 DIAGNOSIS — I059 Rheumatic mitral valve disease, unspecified: Secondary | ICD-10-CM | POA: Insufficient documentation

## 2011-12-17 DIAGNOSIS — Z87891 Personal history of nicotine dependence: Secondary | ICD-10-CM | POA: Insufficient documentation

## 2011-12-17 DIAGNOSIS — Z8249 Family history of ischemic heart disease and other diseases of the circulatory system: Secondary | ICD-10-CM | POA: Insufficient documentation

## 2011-12-17 DIAGNOSIS — R0989 Other specified symptoms and signs involving the circulatory and respiratory systems: Secondary | ICD-10-CM | POA: Insufficient documentation

## 2011-12-17 DIAGNOSIS — I4949 Other premature depolarization: Secondary | ICD-10-CM

## 2011-12-17 DIAGNOSIS — R0609 Other forms of dyspnea: Secondary | ICD-10-CM | POA: Insufficient documentation

## 2011-12-17 MED ORDER — REGADENOSON 0.4 MG/5ML IV SOLN
0.4000 mg | Freq: Once | INTRAVENOUS | Status: AC
  Administered 2011-12-17: 0.4 mg via INTRAVENOUS

## 2011-12-17 MED ORDER — TECHNETIUM TC 99M TETROFOSMIN IV KIT
30.0000 | PACK | Freq: Once | INTRAVENOUS | Status: AC | PRN
  Administered 2011-12-17: 30 via INTRAVENOUS

## 2011-12-17 MED ORDER — TECHNETIUM TC 99M TETROFOSMIN IV KIT
10.0000 | PACK | Freq: Once | INTRAVENOUS | Status: AC | PRN
  Administered 2011-12-17: 10 via INTRAVENOUS

## 2011-12-17 NOTE — ED Provider Notes (Signed)
Medical screening examination/treatment/procedure(s) were performed by non-physician practitioner and as supervising physician I was immediately available for consultation/collaboration.   Maudry Diego, MD 12/17/11 1451

## 2011-12-17 NOTE — Progress Notes (Signed)
[name] [name] 3 NUCLEAR MED [place] [address] [phone]  Cardiology Nuclear Med Study  [name] [name] is a 61 y.o. female     MRN : [id number]     DOB: 12/07/1950  Procedure Date: 12/17/2011  Nuclear Med Background Indication for Stress Test:  Evaluation for Ischemia, 12/16/11 ER- CP History:  '12 MPS:No ischemia, EF=62%; 12/07/11 Echo:EF=60-65%, Trivial AR, moderate MR; Cardiac Risk Factors: Family History - CAD, History of Smoking, Hypertension and Lipids  Symptoms:  Chest Pressure/Tightness (last date of chest discomfort:none since discharge), Diaphoresis, DOE, Light-Headedness and Rapid HR   Nuclear Pre-Procedure Caffeine/Decaff Intake:  None NPO After: 11:30pm   Lungs:  clear O2 Sat: 98% on room air. IV 0.9% NS with Angio Cath:  22g  IV Site: R Hand  IV Started by:  Crissie Figures, RN  Chest Size (in):  36 Cup Size: D  Height: 5' 6"  (1.676 m)  Weight:  160 lb (72.576 kg)  BMI:  Body mass index is 25.82 kg/(m^2). Tech Comments:  Patient held all med's today    Nuclear Med Study 1 or 2 day study: 1 day  Stress Test Type:  Lexiscan  Reading MD: Darlin Coco, MD  Order Authorizing Provider:  Mertie Moores, MD  Resting Radionuclide: Technetium 40mTetrofosmin  Resting Radionuclide Dose: 10.8 mCi   Stress Radionuclide:  Technetium 967metrofosmin  Stress Radionuclide Dose: 32.9 mCi           Stress Protocol Rest HR: 74 Stress HR: 153  Rest BP: 130/69 Stress BP: 160/77  Exercise Time (min): 2:00 METS: n/a   Predicted Max HR: 162 bpm % Max HR: 94.44 bpm Rate Pressure Product: 24480   Dose of Adenosine (mg):  n/a Dose of Lexiscan: 0.4 mg  Dose of Atropine (mg): n/a Dose of Dobutamine: n/a mcg/kg/min (at max HR)  Stress Test Technologist: ShLetta MoynahanCMA-N  Nuclear Technologist:  StCharlton AmorCNMT     Rest Procedure:  Myocardial perfusion imaging was performed at rest 45 minutes following the intravenous administration of  Technetium 9950mtrofosmin.  Rest ECG: No acute changes and occasional PVC's.  Stress Procedure:  The patient received IV Lexiscan 0.4 mg over 15-seconds with concurrent low level exercise and then Technetium 26m30mrofosmin was injected at 30-seconds while the patient continued walking one more minute. There were nonspecific ST-T wave changes with Lexiscan bolus. Quantitative spect images were obtained after a 45-minute delay.  Stress ECG: No significant change from baseline ECG  QPS Raw Data Images:  Normal; no motion artifact; normal heart/lung ratio. Stress Images:  Normal homogeneous uptake in all areas of the myocardium. Rest Images:  Normal homogeneous uptake in all areas of the myocardium. Subtraction (SDS):  No evidence of ischemia. Transient Ischemic Dilatation (Normal <1.22):  1.05 Lung/Heart Ratio (Normal <0.45):  0.26  Quantitative Gated Spect Images QGS EDV:  70 ml QGS ESV:  29 ml  Impression Exercise Capacity:  Lexiscan with low level exercise. BP Response:  Normal blood pressure response. Clinical Symptoms:  No significant symptoms noted. ECG Impression:  No significant ST segment change suggestive of ischemia. Comparison with Prior Nuclear Study: No images to compare  Overall Impression:  Normal stress nuclear study.  LV Ejection Fraction: 58%.  LV Wall Motion:  NL LV Function; NL Wall Motion  [employer]

## 2011-12-23 ENCOUNTER — Ambulatory Visit (INDEPENDENT_AMBULATORY_CARE_PROVIDER_SITE_OTHER): Payer: Medicare Other | Admitting: Family Medicine

## 2011-12-23 ENCOUNTER — Encounter: Payer: Self-pay | Admitting: Family Medicine

## 2011-12-23 VITALS — BP 133/79 | HR 86 | Temp 98.3°F | Ht 66.0 in | Wt 163.2 lb

## 2011-12-23 DIAGNOSIS — K5090 Crohn's disease, unspecified, without complications: Secondary | ICD-10-CM

## 2011-12-23 DIAGNOSIS — I5022 Chronic systolic (congestive) heart failure: Secondary | ICD-10-CM

## 2011-12-23 DIAGNOSIS — F419 Anxiety disorder, unspecified: Secondary | ICD-10-CM

## 2011-12-23 DIAGNOSIS — I509 Heart failure, unspecified: Secondary | ICD-10-CM

## 2011-12-23 DIAGNOSIS — F411 Generalized anxiety disorder: Secondary | ICD-10-CM

## 2011-12-23 NOTE — Assessment & Plan Note (Signed)
Good control.  Apparently due for colonoscopy

## 2011-12-23 NOTE — Progress Notes (Signed)
  Subjective:    Patient ID: [name] [name], female    DOB: 1951/02/03, 61 y.o.   MRN: [id number]  HPI  Recent cards reevaluation for CHF.  Remarkably, EF has improved from 30-35% last fall to now normal 60-65% Anxiety still bad.  Wants to see Dr. Gwenlyn Saran psychologist.   Crohns in good control.  Followed by GI.  Apparently due for another colonoscopy.    Review of Systems     Objective:   Physical Exam Affect good. Neck generous thyroid but recent normal TSH Lungs clear Cardiac RRR without m or g       Assessment & Plan:

## 2011-12-23 NOTE — Patient Instructions (Signed)
Please make an appointment with Dr. Gwenlyn Saran.  I gave you her card.  Stay on all your same meds.   It is very good news that your heart muscle is now working normally. See me in 4 months.

## 2011-12-23 NOTE — Assessment & Plan Note (Signed)
Asymptomatic and remarkable objective EF improvement.

## 2011-12-23 NOTE — Assessment & Plan Note (Signed)
Not well controled on meds.  Refer to Dr. Gwenlyn Saran for counseling.

## 2011-12-28 ENCOUNTER — Telehealth: Payer: Self-pay | Admitting: Psychology

## 2011-12-28 NOTE — Telephone Encounter (Signed)
[name] [name] called to schedule a beh med appointment per the recommendation of her PCP, Dr. Andria Frames.  We scheduled for July 1st at 11:00.  I told her the following: -  If she is unable to make the appointment she needs to call me. -  If she misses the appointment without a phone call, I won't be able to schedule her back in my clinic. She voiced an understanding and was able to repeat the appointment date and time back to me.

## 2011-12-29 ENCOUNTER — Other Ambulatory Visit: Payer: Self-pay | Admitting: Family Medicine

## 2011-12-29 DIAGNOSIS — F419 Anxiety disorder, unspecified: Secondary | ICD-10-CM

## 2011-12-30 NOTE — Assessment & Plan Note (Signed)
Refilled by phone based on e request of controled substance.

## 2012-01-04 ENCOUNTER — Ambulatory Visit: Payer: Medicare Other | Admitting: Psychology

## 2012-01-04 NOTE — Telephone Encounter (Signed)
Did not attend her initial beh med appointment.

## 2012-02-03 ENCOUNTER — Ambulatory Visit (INDEPENDENT_AMBULATORY_CARE_PROVIDER_SITE_OTHER): Payer: Medicare Other | Admitting: Family Medicine

## 2012-02-03 ENCOUNTER — Encounter: Payer: Self-pay | Admitting: Family Medicine

## 2012-02-03 VITALS — BP 137/82 | HR 106 | Temp 98.6°F | Ht 66.0 in | Wt 162.0 lb

## 2012-02-03 DIAGNOSIS — J329 Chronic sinusitis, unspecified: Secondary | ICD-10-CM | POA: Insufficient documentation

## 2012-02-03 MED ORDER — DOXYCYCLINE HYCLATE 100 MG PO TABS: 100.0000 mg | ORAL_TABLET | Freq: Two times a day (BID) | ORAL | Status: AC

## 2012-02-03 NOTE — Progress Notes (Signed)
  Subjective:    Patient ID: [name], female    DOB: January 02, 1951, 61 y.o.   MRN: [id number]  HPI Work in appt for 2 weeks of cough  2 weeks of cough,  Seemed to get better, now worse.  Left sided nasal congestion and sinus pain.  Cough with some sputum.  No fever, chills, nausea, vomting.  Other person in residence also sick with similar- Tarry Kos was prescribed antibiotic by PCP.  I have reviewed patient's  PMH, FH, and Social history and Medications as related to this visit. Former smoker- still quit, using non nicotine e-cigs   Review of Systems See HPI    Objective:   Physical Exam GEN: Alert & Oriented, No acute distress HEENT: [place]/AT. EOMI, PERRLA, no conjunctival injection or scleral icterus.  Bilateral tympanic membranes intact without erythema or effusion.  .  Nares without edema or rhinorrhea.  Oropharynx is without erythema or exudates.  No anterior or posterior cervical lymphadenopathy.  Left nasal congestion with TTP over left maxillary sinus CV:  Regular Rate & Rhythm, no murmur Respiratory:  Normal work of breathing, CTAB Abd:  + BS, soft, no tenderness to palpation Ext: no pre-tibial edema        Assessment & Plan:

## 2012-02-03 NOTE — Patient Instructions (Addendum)
Sinusitis Sinusitis an infection of the air pockets (sinuses) in your face. This can cause puffiness (swelling). It can also cause drainage from your sinuses.   HOME CARE    Only take medicine as told by your doctor.   Drink enough fluids to keep your pee (urine) clear or pale yellow.   Apply moist heat or ice packs for pain relief.   Use salt (saline) nose sprays. The spray will wet the thick fluid in the nose. This can help the sinuses drain.  GET HELP RIGHT AWAY IF:    You have a fever.   Your baby is older than 3 months with a rectal temperature of 102 F (38.9 C) or higher.   Your baby is 65 months old or younger with a rectal temperature of 100.4 F (38 C) or higher.   The pain gets worse.   You get a very bad headache.   You keep throwing up (vomiting).   Your face gets puffy.  MAKE SURE YOU:    Understand these instructions.   Will watch your condition.   Will get help right away if you are not doing well or get worse.  Document Released: 12/09/2007 Document Revised: 06/11/2011 Document Reviewed: 12/09/2007 [facility] Patient Information 2012 [place].

## 2012-02-03 NOTE — Assessment & Plan Note (Signed)
Acute sinusitis.  > 10 days of symptoms with second sickening.  Has penicillin anaphylaxis, will rx docycycline.

## 2012-02-20 ENCOUNTER — Encounter (HOSPITAL_COMMUNITY): Payer: Self-pay | Admitting: Emergency Medicine

## 2012-02-20 ENCOUNTER — Emergency Department (HOSPITAL_COMMUNITY): Payer: Medicare Other

## 2012-02-20 ENCOUNTER — Emergency Department (HOSPITAL_COMMUNITY)
Admission: EM | Admit: 2012-02-20 | Discharge: 2012-02-20 | Disposition: A | Discharge status: Home / Self Care | Payer: Medicare Other | Attending: Emergency Medicine | Admitting: Emergency Medicine

## 2012-02-20 DIAGNOSIS — E785 Hyperlipidemia, unspecified: Secondary | ICD-10-CM | POA: Insufficient documentation

## 2012-02-20 DIAGNOSIS — I509 Heart failure, unspecified: Secondary | ICD-10-CM | POA: Insufficient documentation

## 2012-02-20 DIAGNOSIS — I129 Hypertensive chronic kidney disease with stage 1 through stage 4 chronic kidney disease, or unspecified chronic kidney disease: Secondary | ICD-10-CM | POA: Insufficient documentation

## 2012-02-20 DIAGNOSIS — R404 Transient alteration of awareness: Secondary | ICD-10-CM | POA: Insufficient documentation

## 2012-02-20 DIAGNOSIS — R55 Syncope and collapse: Secondary | ICD-10-CM

## 2012-02-20 DIAGNOSIS — R0602 Shortness of breath: Secondary | ICD-10-CM | POA: Insufficient documentation

## 2012-02-20 DIAGNOSIS — Z79899 Other long term (current) drug therapy: Secondary | ICD-10-CM | POA: Insufficient documentation

## 2012-02-20 DIAGNOSIS — N183 Chronic kidney disease, stage 3 unspecified: Secondary | ICD-10-CM | POA: Insufficient documentation

## 2012-02-20 LAB — POCT I-STAT, CHEM 8
BUN: 22 mg/dL (ref 6–23)
Creatinine, Ser: 1.7 mg/dL — ABNORMAL HIGH (ref 0.50–1.10)
Hemoglobin: 11.6 g/dL — ABNORMAL LOW (ref 12.0–15.0)
Potassium: 3.6 mEq/L (ref 3.5–5.1)
Sodium: 142 mEq/L (ref 135–145)
TCO2: 14 mmol/L (ref 0–100)

## 2012-02-20 LAB — POCT I-STAT TROPONIN I: Troponin i, poc: 0 ng/mL (ref 0.00–0.08)

## 2012-02-20 LAB — CARBOXYHEMOGLOBIN
Methemoglobin: 0.8 % (ref 0.0–1.5)
O2 Saturation: 97.4 %
Total hemoglobin: 10.2 g/dL — ABNORMAL LOW (ref 12.0–16.0)

## 2012-02-20 LAB — CBC
HCT: 32.6 % — ABNORMAL LOW (ref 36.0–46.0)
Hemoglobin: 11 g/dL — ABNORMAL LOW (ref 12.0–15.0)
MCH: 31.3 pg (ref 26.0–34.0)
MCHC: 33.7 g/dL (ref 30.0–36.0)
RBC: 3.51 MIL/uL — ABNORMAL LOW (ref 3.87–5.11)

## 2012-02-20 NOTE — ED Notes (Signed)
PT. ARRIVED WITH EMS FROM HOME , BRIEF SYNCOPAL EPISODE THIS MORNING , STATES SMOKE ALARM RANG AFTER SMOKE FROM STOVE TRIGGERED THE ALARM , FELT LIGHTHEADED AND FELL OUTSIDE HER HOME , BRIEF SYNCOPE , ALERT AND ORIENTED AT ARRIVAL , RESPIRATIONS UNLABORED , PT. C/O RIGHT BACK OF HEAD P[AIN .
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
## 2012-02-21 ENCOUNTER — Other Ambulatory Visit: Payer: Self-pay | Admitting: Family Medicine

## 2012-03-03 ENCOUNTER — Ambulatory Visit (INDEPENDENT_AMBULATORY_CARE_PROVIDER_SITE_OTHER): Payer: Medicare Other | Admitting: Family Medicine

## 2012-03-03 ENCOUNTER — Telehealth: Payer: Self-pay | Admitting: Family Medicine

## 2012-03-03 ENCOUNTER — Encounter: Payer: Self-pay | Admitting: Family Medicine

## 2012-03-03 ENCOUNTER — Ambulatory Visit (HOSPITAL_COMMUNITY)
Admission: RE | Admit: 2012-03-03 | Discharge: 2012-03-03 | Disposition: A | Discharge status: Home / Self Care | Payer: Medicare Other | Source: Ambulatory Visit | Attending: Family Medicine | Admitting: Family Medicine

## 2012-03-03 VITALS — BP 112/67 | HR 79 | Temp 98.8°F | Ht 66.0 in | Wt 156.2 lb

## 2012-03-03 DIAGNOSIS — M25471 Effusion, right ankle: Secondary | ICD-10-CM

## 2012-03-03 DIAGNOSIS — M25579 Pain in unspecified ankle and joints of unspecified foot: Secondary | ICD-10-CM | POA: Insufficient documentation

## 2012-03-03 DIAGNOSIS — M25476 Effusion, unspecified foot: Secondary | ICD-10-CM

## 2012-03-03 DIAGNOSIS — S93439A Sprain of tibiofibular ligament of unspecified ankle, initial encounter: Secondary | ICD-10-CM | POA: Insufficient documentation

## 2012-03-03 LAB — URIC ACID: Uric Acid, Serum: 8.4 mg/dL — ABNORMAL HIGH (ref 2.4–7.0)

## 2012-03-03 NOTE — Progress Notes (Signed)
Subjective:     Patient ID: [name] [name], female   DOB: 10-01-1950, 61 y.o.   MRN: [id number]  HPI 61 yo F presents for same day visit for evaluation of lateral R ankle pain and swelling for the past 6 days. She denies injury to her ankle. She denies similar pain and swelling previously. The pain is worse with walking/weight bearing. The pain is better with rest and nightly tylenol 1000 mg PO q HS. She reports that the pain has not worsened over the past 6 days but it has not gotten better.   Review of Systems As per HPI     Objective:   Physical Exam BP 112/67  Pulse 79  Temp 98.8 F (37.1 C) (Oral)  Ht 5' 6"  (1.676 m)  Wt 156 lb 3.2 oz (70.852 kg)  BMI 25.21 kg/m2 General appearance: alert, cooperative and no distress R Ankle: Visible swelling anterior to lateral malleolus. No erythema. Range of motion is full in all directions. Strength is 5/5 in all directions. Stable lateral and medial ligaments. Talar dome nontender; No pain at base of 5th MT; No tenderness over cuboid; No tenderness over N spot or navicular prominence Tenderness on distal lateral malleolus. No tenderness over and medial malleolus No sign of peroneal tendon subluxations; Able to walk 4 steps.  RIGHT ANKLE - COMPLETE 3+ VIEW  Comparison: None.  Findings: No fracture or dislocation is seen. The ankle mortise is intact. The base of the fifth metatarsal is unremarkable. Well corticated osseous density adjacent to the cuboid, not acute. Visualized soft tissues are grossly unremarkable.   IMPRESSION:  No acute osseous abnormality is seen.      Assessment and Plan:

## 2012-03-03 NOTE — Telephone Encounter (Signed)
Called patient. Normal ankle x-ray. No fractures or dislocations.

## 2012-03-03 NOTE — Patient Instructions (Addendum)
Mrs. [name] [name],  Thank you for coming in today.  For you ankle  1. Rest  2. Ice 20 minutes 3 x per day 3. Elevation: at all times during rest/sitting 4. Keep wrap in place. May remove for shower.  5. For pain: increase tylenol to 500 mg AM, 500 mg afternoon, 1000 mg PM for next 5 days.   I will call with lab and x-ray results.   F/u early next week for persistent pain or swelling.   Dr. Adrian Blackwater

## 2012-03-03 NOTE — Assessment & Plan Note (Signed)
A: suspect syndesmotic ankle sprain, grade I-II. P: -ace wrap applied in office.  -ice 20 minutes 2-3x per day -elevation -elevated Cr, NSAIDs not recommended.  -increase tylenol to 500 mg q AM and q afternoon and 1000 mg q HS for next 5 days.

## 2012-03-03 NOTE — Assessment & Plan Note (Addendum)
A: suspect syndesmotic ankle sprain, grade I-II. P: -ace wrap applied in office.  -ice 20 minutes 2-3x per day -elevation -elevated Cr, NSAIDs not recommended.  -increase tylenol to 500 mg q AM and q afternoon and 1000 mg q HS for next 5 days.

## 2012-03-04 ENCOUNTER — Telehealth: Payer: Self-pay | Admitting: Family Medicine

## 2012-03-04 DIAGNOSIS — M25471 Effusion, right ankle: Secondary | ICD-10-CM

## 2012-03-04 NOTE — Assessment & Plan Note (Addendum)
A: Slightly elevated uric acid. Slightly elevated ESR. Gout possible given joint pain/swelling without injury. However she had small effusion without erythema which is more suggestive of ankle sprain.  Not a candidate for NSAID's given renal dysfunction. P: Continue RICE and wrap. If persistent/worsening pain and swelling will treat for gout with low dose colchicine.

## 2012-03-06 ENCOUNTER — Encounter: Payer: Self-pay | Admitting: Family Medicine

## 2012-03-06 NOTE — Telephone Encounter (Signed)
Called patient left VM.  slight elevation in ESR and uric acid. I do not suspect gouty arthritis given minimal effusion and lack of erythema. I still suspect ankle sprain major cause of symptom. However, if patient still symptomatic after days of rest will treat gout. She cannot take NSAIDs. Will treat with low colchicine and steroids.

## 2012-03-15 ENCOUNTER — Telehealth: Payer: Self-pay | Admitting: Family Medicine

## 2012-03-15 NOTE — Telephone Encounter (Signed)
Patient calling reporting that Asacol 474m no longer available at pharmacy.  Reports that she is currently symptom free but would like medication issues sorted out as soon as possible.  She would appreciate a call letting her know when the pharmacy has a new prescription.

## 2012-03-16 NOTE — Telephone Encounter (Signed)
Called and LM to check with other pharmacies.  If the drug needs to be switched, she will need to discuss with Dr. Amedeo Plenty, her GI doc.

## 2012-03-19 ENCOUNTER — Other Ambulatory Visit: Payer: Self-pay | Admitting: Family Medicine

## 2012-04-29 ENCOUNTER — Encounter: Payer: Self-pay | Admitting: Family Medicine

## 2012-04-29 ENCOUNTER — Ambulatory Visit (INDEPENDENT_AMBULATORY_CARE_PROVIDER_SITE_OTHER): Payer: Medicare Other | Admitting: Family Medicine

## 2012-04-29 VITALS — BP 112/78 | HR 76 | Temp 98.0°F | Wt 150.6 lb

## 2012-04-29 DIAGNOSIS — F411 Generalized anxiety disorder: Secondary | ICD-10-CM

## 2012-04-29 DIAGNOSIS — F419 Anxiety disorder, unspecified: Secondary | ICD-10-CM

## 2012-04-29 DIAGNOSIS — I10 Essential (primary) hypertension: Secondary | ICD-10-CM

## 2012-04-29 DIAGNOSIS — K5090 Crohn's disease, unspecified, without complications: Secondary | ICD-10-CM

## 2012-04-29 DIAGNOSIS — Z23 Encounter for immunization: Secondary | ICD-10-CM

## 2012-05-04 NOTE — Assessment & Plan Note (Signed)
Worse with separation.

## 2012-05-04 NOTE — Assessment & Plan Note (Signed)
Stable at this point.

## 2012-05-04 NOTE — Assessment & Plan Note (Signed)
Good control

## 2012-05-04 NOTE — Progress Notes (Signed)
  Subjective:    Patient ID: [name] [name], female    DOB: 1951-05-06, 61 y.o.   MRN: [id number]  HPI Doing generally well.  She is on her regular meds and has no medical complaints.  In passing she mentioned that she has seperated from her significant other of many years.  She is mildly upset about this.  Up to date on health maint.      Review of Systems     Objective:   Physical Exam stable wt and BP noted.   Mildly anxious affect which is not new for her.   LUngs clear, Cardiac RRR without m or g Abd benign.   Ext no edema        Assessment & Plan:

## 2012-05-25 ENCOUNTER — Emergency Department (HOSPITAL_COMMUNITY)
Admission: EM | Admit: 2012-05-25 | Discharge: 2012-05-26 | Disposition: A | Discharge status: Home / Self Care | Payer: Medicare Other | Attending: Emergency Medicine | Admitting: Emergency Medicine

## 2012-05-25 ENCOUNTER — Encounter (HOSPITAL_COMMUNITY): Payer: Self-pay | Admitting: Adult Health

## 2012-05-25 DIAGNOSIS — K5090 Crohn's disease, unspecified, without complications: Secondary | ICD-10-CM | POA: Insufficient documentation

## 2012-05-25 DIAGNOSIS — Z8619 Personal history of other infectious and parasitic diseases: Secondary | ICD-10-CM | POA: Insufficient documentation

## 2012-05-25 DIAGNOSIS — I129 Hypertensive chronic kidney disease with stage 1 through stage 4 chronic kidney disease, or unspecified chronic kidney disease: Secondary | ICD-10-CM | POA: Insufficient documentation

## 2012-05-25 DIAGNOSIS — N183 Chronic kidney disease, stage 3 unspecified: Secondary | ICD-10-CM | POA: Insufficient documentation

## 2012-05-25 DIAGNOSIS — K219 Gastro-esophageal reflux disease without esophagitis: Secondary | ICD-10-CM | POA: Insufficient documentation

## 2012-05-25 DIAGNOSIS — R51 Headache: Secondary | ICD-10-CM | POA: Insufficient documentation

## 2012-05-25 DIAGNOSIS — Z79899 Other long term (current) drug therapy: Secondary | ICD-10-CM | POA: Insufficient documentation

## 2012-05-25 DIAGNOSIS — Z862 Personal history of diseases of the blood and blood-forming organs and certain disorders involving the immune mechanism: Secondary | ICD-10-CM | POA: Insufficient documentation

## 2012-05-25 DIAGNOSIS — Z87891 Personal history of nicotine dependence: Secondary | ICD-10-CM | POA: Insufficient documentation

## 2012-05-25 DIAGNOSIS — E785 Hyperlipidemia, unspecified: Secondary | ICD-10-CM | POA: Insufficient documentation

## 2012-05-25 DIAGNOSIS — F411 Generalized anxiety disorder: Secondary | ICD-10-CM | POA: Insufficient documentation

## 2012-05-25 DIAGNOSIS — I509 Heart failure, unspecified: Secondary | ICD-10-CM | POA: Insufficient documentation

## 2012-05-25 MED ORDER — DIPHENHYDRAMINE HCL 50 MG/ML IJ SOLN
25.0000 mg | Freq: Once | INTRAMUSCULAR | Status: AC
  Administered 2012-05-25: 25 mg via INTRAVENOUS
  Filled 2012-05-25: qty 1

## 2012-05-25 MED ORDER — SODIUM CHLORIDE 0.9 % IV BOLUS (SEPSIS)
1000.0000 mL | Freq: Once | INTRAVENOUS | Status: AC
  Administered 2012-05-25: 1000 mL via INTRAVENOUS

## 2012-05-25 MED ORDER — DEXAMETHASONE SODIUM PHOSPHATE 10 MG/ML IJ SOLN
10.0000 mg | Freq: Once | INTRAMUSCULAR | Status: AC
  Administered 2012-05-25: 10 mg via INTRAVENOUS
  Filled 2012-05-25: qty 1

## 2012-05-25 MED ORDER — METOCLOPRAMIDE HCL 5 MG/ML IJ SOLN
10.0000 mg | Freq: Once | INTRAMUSCULAR | Status: AC
  Administered 2012-05-25: 10 mg via INTRAVENOUS
  Filled 2012-05-25: qty 2

## 2012-05-25 NOTE — ED Notes (Signed)
Reports headache and hypertension that began a little after 8 pm tonight. Pt reports taking her BP medication at 2030. Headache is rated 6/10 and described as throbbing located in frontal lobe. Denies sensitivity to light and sound. Alert and oriented and MAEx4.

## 2012-05-25 NOTE — ED Provider Notes (Signed)
History     CSN: [id number]  Arrival date & time 05/25/12  2147   First MD Initiated Contact with Patient 05/25/12 2212      Chief Complaint  Patient presents with  . Headache    (Consider location/radiation/quality/duration/timing/severity/associated sxs/prior treatment) HPI History provided by pt.   Pt had gradual onset severe, diffuse frontal headache this evening after eating dinner.  Attributed to HTN but no relief shortly after taking BP medications.  No associated fever, dizziness, blurred vision, photo/phonophobia, dysarthria, dysphagia, confusion, extremity weakness/paresthesias, ataxia or N/V.  Denies head trauma and she is not anti-coagulated.  Has had similar headaches in the past but never to this severity.  Current pain level 6/10.     Past Medical History  Diagnosis Date  . Crohn disease   . Hypertension   . Hyperlipidemia   . CHF (congestive heart failure)   . GERD (gastroesophageal reflux disease)   . 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 Anxiety   . Panic attacks   . CKD (chronic kidney disease), stage III   . Anemia, chronic renal failure     Past Surgical History  Procedure Date  . Cholecystectomy, laparoscopic 01/28/05  . 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 Salpingectomy     left    Family History  Problem Relation Age of Onset  . Heart disease Mother   . Stroke Father     History  Substance Use Topics  . Smoking status: Former Smoker -- 0.5 packs/day for 18 years    Types: Cigarettes    Quit date: 05/10/2011  . Smokeless tobacco: Never Used  . Alcohol Use: No    OB History    Grav Para Term Preterm Abortions TAB SAB Ect Mult Living                  Review of Systems  All other systems reviewed and are negative.    Allergies  Amoxicillin; Penicillins; Ace inhibitors; Gabapentin; and Aspirin  Home Medications   Current Outpatient Rx  Name  Route  Sig  Dispense  Refill  . CALCIUM CARBONATE-VITAMIN D 500-200 MG-UNIT PO TABS    Oral   Take 1 tablet by mouth 2 (two) times daily.         Marland Kitchen CITALOPRAM HYDROBROMIDE 20 MG PO TABS   Oral   Take 1 tablet (20 mg total) by mouth daily.   30 tablet   5   . DIOVAN 80 MG PO TABS      take 1 tablet by mouth once daily   30 tablet   6   . ISOSORBIDE MONONITRATE ER 30 MG PO TB24   Oral   Take 1 tablet (30 mg total) by mouth at bedtime.   90 tablet   3   . LORAZEPAM 0.5 MG PO TABS      take 1 tablet by mouth twice a day for anxiety   60 tablet   5   . MESALAMINE 400 MG PO TBEC   Oral   Take 1,200 mg by mouth 2 (two) times daily. 3 tabs twice daily         . METOPROLOL SUCCINATE ER 50 MG PO TB24   Oral   Take 50 mg by mouth daily.         . ADULT MULTIVITAMIN W/MINERALS CH   Oral   Take 1 tablet by mouth daily.         Marland Kitchen OMEPRAZOLE 20 MG PO CPDR  Oral   Take 20 mg by mouth daily.         Marland Kitchen SIMVASTATIN 20 MG PO TABS   Oral   Take 20 mg by mouth at bedtime.            BP 143/81  Pulse 74  Temp 98 F (36.7 C)  Resp 18  SpO2 100%  Physical Exam  Nursing note and vitals reviewed. Constitutional: She is oriented to person, place, and time. She appears well-developed and well-nourished. No distress.  HENT:  Head: Normocephalic and atraumatic.       No tenderness of sinuses or temples.  Temporal arteries are not palpable.  No jaw claudication.  Nml posterior pharynx and tonsil.    Eyes:       Normal appearance  Neck: Normal range of motion.       No meningeal signs  Cardiovascular: Normal rate, regular rhythm and intact distal pulses.   Pulmonary/Chest: Effort normal and breath sounds normal.  Musculoskeletal: Normal range of motion.  Lymphadenopathy:    She has no cervical adenopathy.  Neurological: She is alert and oriented to person, place, and time. No sensory deficit. Coordination normal.       CN 3-12 intact.  No nystagmus. 5/5 and equal upper and lower extremity strength.  No past pointing.     Skin: Skin is warm and dry.  No rash noted.  Psychiatric: She has a normal mood and affect. Her behavior is normal.    ED Course  Procedures (including critical care time)  Labs Reviewed - No data to display No results found.   1. Headache       MDM  61yo F presents w/ frontal headache.  Doubt ICH; no trauma, not anti-coagulated, gradual onset, no associated neurologic sx nor focal neuro deficits on exam.  No fever or meningeal signs and pt is well-appearing.   Has had similar headaches in past.  Will treat pain w/ IV fluids, and recheck shortly.  10:18 PM   Pt reports that pain has improved from 6/10 to 3/10.  She continues to look well and VSS.  She ambulated to bathroom w/out difficulty.  D/c'd home.  Return precautions discussed. 12:10 AM         Remer Macho, PA 05/26/12 0022

## 2012-05-26 NOTE — ED Provider Notes (Signed)
Medical screening examination/treatment/procedure(s) were performed by non-physician practitioner and as supervising physician I was immediately available for consultation/collaboration.  Carmin Muskrat, MD 05/26/12 [phone]

## 2012-05-30 ENCOUNTER — Observation Stay (HOSPITAL_COMMUNITY)
Admission: EM | Admit: 2012-05-30 | Discharge: 2012-06-01 | Disposition: A | Discharge status: Home / Self Care | Payer: Medicare Other | Attending: Cardiovascular Disease | Admitting: Cardiovascular Disease

## 2012-05-30 ENCOUNTER — Encounter (HOSPITAL_COMMUNITY): Payer: Self-pay | Admitting: *Deleted

## 2012-05-30 ENCOUNTER — Emergency Department (HOSPITAL_COMMUNITY): Payer: Medicare Other

## 2012-05-30 DIAGNOSIS — J45909 Unspecified asthma, uncomplicated: Secondary | ICD-10-CM | POA: Insufficient documentation

## 2012-05-30 DIAGNOSIS — N184 Chronic kidney disease, stage 4 (severe): Secondary | ICD-10-CM | POA: Diagnosis present

## 2012-05-30 DIAGNOSIS — N189 Chronic kidney disease, unspecified: Secondary | ICD-10-CM

## 2012-05-30 DIAGNOSIS — R0789 Other chest pain: Principal | ICD-10-CM | POA: Insufficient documentation

## 2012-05-30 DIAGNOSIS — K5090 Crohn's disease, unspecified, without complications: Secondary | ICD-10-CM | POA: Insufficient documentation

## 2012-05-30 DIAGNOSIS — D631 Anemia in chronic kidney disease: Secondary | ICD-10-CM

## 2012-05-30 DIAGNOSIS — I509 Heart failure, unspecified: Secondary | ICD-10-CM | POA: Insufficient documentation

## 2012-05-30 DIAGNOSIS — I10 Essential (primary) hypertension: Secondary | ICD-10-CM

## 2012-05-30 DIAGNOSIS — I129 Hypertensive chronic kidney disease with stage 1 through stage 4 chronic kidney disease, or unspecified chronic kidney disease: Secondary | ICD-10-CM | POA: Insufficient documentation

## 2012-05-30 DIAGNOSIS — K219 Gastro-esophageal reflux disease without esophagitis: Secondary | ICD-10-CM

## 2012-05-30 DIAGNOSIS — Z720 Tobacco use: Secondary | ICD-10-CM

## 2012-05-30 DIAGNOSIS — R51 Headache: Secondary | ICD-10-CM | POA: Insufficient documentation

## 2012-05-30 DIAGNOSIS — F419 Anxiety disorder, unspecified: Secondary | ICD-10-CM | POA: Diagnosis present

## 2012-05-30 DIAGNOSIS — I5022 Chronic systolic (congestive) heart failure: Secondary | ICD-10-CM | POA: Diagnosis present

## 2012-05-30 DIAGNOSIS — I2510 Atherosclerotic heart disease of native coronary artery without angina pectoris: Secondary | ICD-10-CM

## 2012-05-30 DIAGNOSIS — I5020 Unspecified systolic (congestive) heart failure: Secondary | ICD-10-CM

## 2012-05-30 DIAGNOSIS — N183 Chronic kidney disease, stage 3 unspecified: Secondary | ICD-10-CM | POA: Insufficient documentation

## 2012-05-30 DIAGNOSIS — F411 Generalized anxiety disorder: Secondary | ICD-10-CM | POA: Insufficient documentation

## 2012-05-30 DIAGNOSIS — R079 Chest pain, unspecified: Secondary | ICD-10-CM

## 2012-05-30 DIAGNOSIS — J449 Chronic obstructive pulmonary disease, unspecified: Secondary | ICD-10-CM

## 2012-05-30 DIAGNOSIS — N039 Chronic nephritic syndrome with unspecified morphologic changes: Secondary | ICD-10-CM | POA: Insufficient documentation

## 2012-05-30 DIAGNOSIS — F17200 Nicotine dependence, unspecified, uncomplicated: Secondary | ICD-10-CM | POA: Insufficient documentation

## 2012-05-30 DIAGNOSIS — E7800 Pure hypercholesterolemia, unspecified: Secondary | ICD-10-CM | POA: Diagnosis present

## 2012-05-30 HISTORY — DX: Crohn's disease, unspecified, without complications: K50.90

## 2012-05-30 HISTORY — DX: Abnormal findings on diagnostic imaging of other specified body structures: R93.89

## 2012-05-30 HISTORY — DX: Unspecified asthma, uncomplicated: J45.909

## 2012-05-30 HISTORY — DX: Tobacco use: Z72.0

## 2012-05-30 HISTORY — DX: Headache: R51

## 2012-05-30 LAB — BASIC METABOLIC PANEL
BUN: 22 mg/dL (ref 6–23)
CO2: 18 mEq/L — ABNORMAL LOW (ref 19–32)
Chloride: 109 mEq/L (ref 96–112)
GFR calc non Af Amer: 30 mL/min — ABNORMAL LOW (ref >90)
Glucose, Bld: 101 mg/dL — ABNORMAL HIGH (ref 70–99)
Potassium: 3.6 mEq/L (ref 3.5–5.1)
Sodium: 139 mEq/L (ref 135–145)

## 2012-05-30 LAB — CBC WITH DIFFERENTIAL/PLATELET
Eosinophils Absolute: 0.2 10*3/uL (ref 0.0–0.7)
Hemoglobin: 10.5 g/dL — ABNORMAL LOW (ref 12.0–15.0)
Lymphocytes Relative: 38 % (ref 12–46)
Lymphs Abs: 3.5 10*3/uL (ref 0.7–4.0)
MCH: 30.3 pg (ref 26.0–34.0)
Monocytes Relative: 7 % (ref 3–12)
Neutro Abs: 4.8 10*3/uL (ref 1.7–7.7)
Neutrophils Relative %: 52 % (ref 43–77)
Platelets: 184 10*3/uL (ref 150–400)
RBC: 3.47 MIL/uL — ABNORMAL LOW (ref 3.87–5.11)
WBC: 9.2 10*3/uL (ref 4.0–10.5)

## 2012-05-30 LAB — TROPONIN I: Troponin I: <0.3 ng/mL (ref <0.30)

## 2012-05-30 LAB — POCT I-STAT TROPONIN I: Troponin i, poc: 0 ng/mL (ref 0.00–0.08)

## 2012-05-30 MED ORDER — IRBESARTAN 75 MG PO TABS
75.0000 mg | ORAL_TABLET | Freq: Every day | ORAL | Status: DC
  Administered 2012-05-30 – 2012-06-01 (×3): 75 mg via ORAL
  Filled 2012-05-30 (×3): qty 1

## 2012-05-30 MED ORDER — ONDANSETRON HCL 4 MG/2ML IJ SOLN: 4.0000 mg | Freq: Four times a day (QID) | INTRAMUSCULAR | Status: DC | PRN

## 2012-05-30 MED ORDER — ONDANSETRON HCL 4 MG/2ML IJ SOLN
4.0000 mg | Freq: Once | INTRAMUSCULAR | Status: AC
  Administered 2012-05-30: 4 mg via INTRAVENOUS
  Filled 2012-05-30: qty 2

## 2012-05-30 MED ORDER — SIMVASTATIN 20 MG PO TABS
20.0000 mg | ORAL_TABLET | Freq: Every day | ORAL | Status: DC
  Administered 2012-05-30 – 2012-05-31 (×2): 20 mg via ORAL
  Filled 2012-05-30 (×3): qty 1

## 2012-05-30 MED ORDER — NITROGLYCERIN 0.4 MG SL SUBL: 0.4000 mg | SUBLINGUAL_TABLET | SUBLINGUAL | Status: DC | PRN

## 2012-05-30 MED ORDER — CITALOPRAM HYDROBROMIDE 20 MG PO TABS
20.0000 mg | ORAL_TABLET | Freq: Every day | ORAL | Status: DC
  Administered 2012-05-30 – 2012-06-01 (×3): 20 mg via ORAL
  Filled 2012-05-30 (×3): qty 1

## 2012-05-30 MED ORDER — ISOSORBIDE MONONITRATE ER 30 MG PO TB24
30.0000 mg | ORAL_TABLET | Freq: Every day | ORAL | Status: DC
  Administered 2012-05-30 – 2012-05-31 (×2): 30 mg via ORAL
  Filled 2012-05-30 (×3): qty 1

## 2012-05-30 MED ORDER — CLOPIDOGREL BISULFATE 75 MG PO TABS
75.0000 mg | ORAL_TABLET | Freq: Every day | ORAL | Status: DC
  Administered 2012-05-30 – 2012-06-01 (×3): 75 mg via ORAL
  Filled 2012-05-30 (×4): qty 1

## 2012-05-30 MED ORDER — ACETAMINOPHEN 325 MG PO TABS: 650.0000 mg | ORAL_TABLET | ORAL | Status: DC | PRN

## 2012-05-30 MED ORDER — LORAZEPAM 0.5 MG PO TABS
0.5000 mg | ORAL_TABLET | Freq: Two times a day (BID) | ORAL | Status: DC
  Administered 2012-05-30 – 2012-06-01 (×4): 0.5 mg via ORAL
  Filled 2012-05-30 (×4): qty 1

## 2012-05-30 MED ORDER — METOPROLOL SUCCINATE ER 50 MG PO TB24
50.0000 mg | ORAL_TABLET | Freq: Every day | ORAL | Status: DC
  Administered 2012-05-30 – 2012-06-01 (×3): 50 mg via ORAL
  Filled 2012-05-30 (×3): qty 1

## 2012-05-30 MED ORDER — MESALAMINE 800 MG PO TBEC
1.0000 | DELAYED_RELEASE_TABLET | Freq: Three times a day (TID) | ORAL | Status: DC
  Administered 2012-05-30 (×2): 800 mg via ORAL
  Filled 2012-05-30 (×5): qty 1

## 2012-05-30 MED ORDER — PANTOPRAZOLE SODIUM 40 MG PO TBEC
40.0000 mg | DELAYED_RELEASE_TABLET | Freq: Every day | ORAL | Status: DC
  Administered 2012-05-31 – 2012-06-01 (×2): 40 mg via ORAL
  Filled 2012-05-30: qty 1

## 2012-05-30 MED ORDER — HYDROMORPHONE HCL PF 1 MG/ML IJ SOLN
1.0000 mg | Freq: Once | INTRAMUSCULAR | Status: AC
  Administered 2012-05-30: 1 mg via INTRAVENOUS
  Filled 2012-05-30: qty 1

## 2012-05-30 MED ORDER — MORPHINE SULFATE 4 MG/ML IJ SOLN: 2.0000 mg | Freq: Once | INTRAMUSCULAR | Status: DC

## 2012-05-30 NOTE — ED Notes (Cosign Needed)
Patient states she had chest pain this am starting at 0700, patient states midsternal chest pain, patient took antacid and lorazapam and was stating pain free at time of arrival

## 2012-05-30 NOTE — ED Notes (Signed)
Pt undressed, in gown, on monitor, continuous pulse oximetry and blood pressure cuff; EKG performed

## 2012-05-30 NOTE — ED Provider Notes (Signed)
History     CSN: [id number]  Arrival date & time 05/30/12  8937   First MD Initiated Contact with Patient 05/30/12 0735      Chief Complaint  Patient presents with  . Chest Pain    (Consider location/radiation/quality/duration/timing/severity/associated sxs/prior treatment) HPI Comments: Patient is a 61 year old with an extensive past medical history including CHF, stage 3 CKD, hypertension, Crohn's disease, GERD and anxiety who presents with chest pain that started suddenly this morning that woke her from sleep. Patient reports sleeping and having sudden onset of chest pressure and tightness that was located in the central chest and radiated down left arm. The patient reports associated nausea, SOB, and diaphoresis. The pain started about 20 minutes ago and has continued. Patient tried taking antacid and lorazepam for symptoms without relief. No aggravating/allevaiting factors. Patient denies fever, headache, visual changes, vomiting, diarrhea, abdominal pain, numbness/tingling.     Past Medical History  Diagnosis Date  . Crohn disease   . Hypertension   . Hyperlipidemia   . CHF (congestive heart failure)   . GERD (gastroesophageal reflux disease)   . 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 Anxiety   . Panic attacks   . CKD (chronic kidney disease), stage III   . Anemia, chronic renal failure     Past Surgical History  Procedure Date  . Cholecystectomy, laparoscopic 01/28/05  . 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 Salpingectomy     left    Family History  Problem Relation Age of Onset  . Heart disease Mother   . Stroke Father     History  Substance Use Topics  . Smoking status: Former Smoker -- 0.5 packs/day for 18 years    Types: Cigarettes    Quit date: 05/10/2011  . Smokeless tobacco: Never Used  . Alcohol Use: No    OB History    Grav Para Term Preterm Abortions TAB SAB Ect Mult Living                  Review of Systems  Respiratory: Positive for chest  tightness.   Cardiovascular: Positive for chest pain.  Gastrointestinal: Positive for nausea.  All other systems reviewed and are negative.    Allergies  Amoxicillin; Penicillins; Ace inhibitors; Gabapentin; and Aspirin  Home Medications   Current Outpatient Rx  Name  Route  Sig  Dispense  Refill  . CALCIUM CARBONATE-VITAMIN D 500-200 MG-UNIT PO TABS   Oral   Take 1 tablet by mouth 2 (two) times daily.         Marland Kitchen CITALOPRAM HYDROBROMIDE 20 MG PO TABS   Oral   Take 1 tablet (20 mg total) by mouth daily.   30 tablet   5   . DIOVAN 80 MG PO TABS      take 1 tablet by mouth once daily   30 tablet   6   . ISOSORBIDE MONONITRATE ER 30 MG PO TB24   Oral   Take 1 tablet (30 mg total) by mouth at bedtime.   90 tablet   3   . LORAZEPAM 0.5 MG PO TABS      take 1 tablet by mouth twice a day for anxiety   60 tablet   5   . MESALAMINE 400 MG PO TBEC   Oral   Take 1,200 mg by mouth 2 (two) times daily. 3 tabs twice daily         . METOPROLOL SUCCINATE ER 50 MG PO  TB24   Oral   Take 50 mg by mouth daily.         . ADULT MULTIVITAMIN W/MINERALS CH   Oral   Take 1 tablet by mouth daily.         Marland Kitchen OMEPRAZOLE 20 MG PO CPDR   Oral   Take 20 mg by mouth daily.         Marland Kitchen SIMVASTATIN 20 MG PO TABS   Oral   Take 20 mg by mouth at bedtime.            There were no vitals taken for this visit.  Physical Exam  Nursing note and vitals reviewed. Constitutional: She is oriented to person, place, and time. She appears well-developed and well-nourished. No distress.       Patient appears anxious  HENT:  Head: Normocephalic and atraumatic.  Mouth/Throat: No oropharyngeal exudate.  Eyes: Conjunctivae normal are normal.  Neck: Normal range of motion. Neck supple.  Cardiovascular: Normal rate, regular rhythm and intact distal pulses.  Exam reveals no gallop and no friction rub.   No murmur heard.      No peripheral edema.   Pulmonary/Chest: Effort normal and  breath sounds normal. She has no wheezes. She has no rales. She exhibits no tenderness.  Abdominal: Soft. She exhibits no distension. There is no tenderness. There is no rebound and no guarding.       Ostomy bag noted. No tenderness to palpation.   Musculoskeletal: Normal range of motion.  Neurological: She is alert and oriented to person, place, and time. Coordination normal.       Speech is goal-oriented. Moves limbs without ataxia.   Skin: Skin is warm and dry. She is not diaphoretic.  Psychiatric: She has a normal mood and affect. Her behavior is normal.    ED Course  Procedures (including critical care time)   Date: 05/30/2012  Rate: 75  Rhythm: normal sinus rhythm  QRS Axis: normal  Intervals: QT prolonged  ST/T Wave abnormalities: normal  Conduction Disutrbances:nonspecific intraventricular conduction delay  Narrative Interpretation: NSR unchanged from previous  Old EKG Reviewed: unchanged    Labs Reviewed  CBC WITH DIFFERENTIAL - Abnormal; Notable for the following:    RBC 3.47 (*)     Hemoglobin 10.5 (*)     HCT 31.6 (*)     All other components within normal limits  BASIC METABOLIC PANEL - Abnormal; Notable for the following:    CO2 18 (*)     Glucose, Bld 101 (*)     Creatinine, Ser 1.78 (*)     GFR calc non Af Amer 30 (*)     GFR calc Af Amer 35 (*)     All other components within normal limits  PRO B NATRIURETIC PEPTIDE - Abnormal; Notable for the following:    Pro B Natriuretic peptide (BNP) 168.6 (*)     All other components within normal limits  POCT I-STAT TROPONIN I   No results found.   1. Anxiety   2. Congestive heart failure with left ventricular systolic dysfunction   3. CAD (coronary artery disease)   4. Hypertension, benign   5. Chronic kidney disease (CKD), stage III (moderate)   6. Reflux esophagitis   7. Chest pain   8. GERD (gastroesophageal reflux disease)       MDM  7:44 AM Basic labs, troponin, chest xray pending. Patient will  have morphine and zofran for pain and nausea.  8:12 AM Patient reports morphine  allergy so she will have dilaudid.    9:08 AM Patient not having current chest pain. [facility] will see the patient.       Alvina Chou, PA-C 06/06/12 2200

## 2012-05-30 NOTE — ED Notes (Signed)
Pt denies pain; pt denies nausea; pt denies feeling anxious; pt denies feeling short of breath.

## 2012-05-30 NOTE — Progress Notes (Signed)
Pt admitted to room 4713 per stretcher. VS done and placed on heart monitor. Oriented to room and diet ordered. Pt states she is pain free.

## 2012-05-30 NOTE — Care Management Note (Unsigned)
    Page 1 of 1   05/30/2012     4:17:18 PM   CARE MANAGEMENT NOTE 05/30/2012  Patient:  [place],[name] [name]   Account Number:  [id number]  Date Initiated:  05/30/2012  Documentation initiated by:  GRAVES-BIGELOW,Tyyonna Soucy  Subjective/Objective Assessment:   Pt admitted with cp.     Action/Plan:   CM will continue to monitor for disposition needs.   Anticipated DC Date:  05/31/2012   Anticipated DC Plan:  [place]  CM consult      Choice offered to / List presented to:             Status of service:  In process, will continue to follow Medicare Important Message given?   (If response is "NO", the following Medicare IM given date fields will be blank) Date Medicare IM given:   Date Additional Medicare IM given:    Discharge Disposition:    Per UR Regulation:  Reviewed for med. necessity/level of care/duration of stay  If discussed at [place] of Stay Meetings, dates discussed:    Comments:

## 2012-05-30 NOTE — ED Notes (Signed)
Pt denies pain; pt denies shortness of breath, nausea and dizziness.

## 2012-05-30 NOTE — ED Notes (Signed)
Report given to [place], Therapist, sports. Nurse has no further questions upon report given. Pt being prepared for transport to floor.

## 2012-05-30 NOTE — H&P (Signed)
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   She was seen in consultation in 11/2010 for similar complaints for which cath vs nuc stress test was discussed. The pt opted for nuc stress test first which revealed preserved EF and no evidence of ischemia. She remained stable, then was treated in 05/2011 for PNA. Echo was obtained revealing reduced EF (as above), and she was referred to [facility] cards as an OP. She was treated and followed, most recently in 06/13 at which time EF normalized. She was continued on Diovan (ACEi->cough) and metoprolol, Imdur and statin. Note, she does have an ASA intolerance (rash).   She reports experiencing sudden left-sided chest pressure/burning with radiation to her left shoulder and arm w/ associated nausea and shortness of breath rated at a 8/10->0/10 for several minutes. She took an antacid and ativan without improvement. She did note abnormal taste/water brash with this. Given the persistence and severity of her pain, she asked her son to take her to the ED. This was similar to chest pain endorsed in 2012. No lightheadedness, palpitations, LEE, PND or orthopnea. Functionally, she is only able to do household chores (vaccuum), otherwise she is limited by SOB. This has been stable, not worsening. No fevers, chills, cough,  sick contacts, extended travel, unilateral leg swelling or pain. She smokes intermittently, however is currently smoking. No EtOH or elicit drug use. Mother with heart disease.   In the ED, EKG reveals no evidence of ischemia. Initial trop-I WNL. BMET reveals Cr 1.78. CBC indicates low Hgb/hct at 10.5/31.6, MCV 91.1. pBNP very mildly elevated at 168.6. CXR shows lingular linear densities c/w scarring vs atelectasis. VSS. Currently stable, cp free in NAD.   Problem List: Past Medical History  Diagnosis Date  . Crohn's disease   . Hypertension   . Hyperlipidemia   . CHF (congestive heart failure)     EF 30-35% 2012->EF 60-65% 2013  . GERD (gastroesophageal reflux disease)   . 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 Anxiety   . Panic attacks   . CKD (chronic kidney disease), stage III   . Anemia, chronic renal failure   . Tobacco abuse     Past Surgical History  Procedure Date  . Cholecystectomy, laparoscopic 01/28/05  . 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 Salpingectomy     left     Allergies:  Allergies  Allergen Reactions  . Amoxicillin Anaphylaxis and Rash    REACTION:Throat Swelling  . Penicillins Anaphylaxis and Rash  . Ace Inhibitors Cough    ACE possibly associated with cough and switched to ARB.  Would be OK to re-challenge if needed.  . Gabapentin     Patient had one time seizure shortly after stopping gabapentin  . Aspirin Itching and Rash    REACTION: Rash only.  NO breathing issues with use.   Able to tolerate ASACOL  .  Morphine And Related     Home Medications: Prior to Admission medications   Medication Sig Start Date End Date Taking? Authorizing Provider  calcium-vitamin D (OSCAL 500/200 D-3) 500-200 MG-UNIT per tablet Take 1 tablet by mouth 2 (two) times daily. 10/08/11 10/07/12 Yes Zigmund Gottron, MD  citalopram (CELEXA) 20 MG tablet Take 1 tablet (20 mg total) by mouth daily. 12/01/11 11/30/12 Yes Katherina Mires, MD  isosorbide mononitrate (IMDUR) 30 MG 24 hr  tablet Take 1 tablet (30 mg total) by mouth at bedtime. 06/10/11 06/09/12 Yes Zigmund Gottron, MD  LORazepam (ATIVAN) 0.5 MG tablet Take 0.5 mg by mouth 2 (two) times daily.   Yes Historical Provider, MD  Mesalamine 800 MG TBEC Take 1 tablet by mouth 3 (three) times daily.   Yes Historical Provider, MD  metoprolol succinate (TOPROL-XL) 50 MG 24 hr tablet Take 50 mg by mouth daily. 07/15/11 07/14/12 Yes Zigmund Gottron, MD  Multiple Vitamin (MULITIVITAMIN WITH MINERALS) TABS Take 1 tablet by mouth daily.   Yes Historical Provider, MD  omeprazole (PRILOSEC) 20 MG capsule Take 20 mg by mouth daily.   Yes Historical Provider, MD  simvastatin (ZOCOR) 20 MG tablet Take 20 mg by mouth at bedtime.  08/19/11  Yes Zigmund Gottron, MD  valsartan (DIOVAN) 80 MG tablet Take 80 mg by mouth daily.   Yes Historical Provider, MD   Inpatient Medications:     . [COMPLETED]  HYDROmorphone (DILAUDID) injection  1 mg Intravenous Once  . [COMPLETED] ondansetron (ZOFRAN) IV  4 mg Intravenous Once  . [DISCONTINUED] morphine  2 mg Intravenous Once    (Not in a hospital admission)  Family History  Problem Relation Age of Onset  . Heart disease Mother   . Stroke Father      History   Social History  . Marital Status: Single    Spouse Name: N/A    Number of Children: 1  . Years of Education: associate   Occupational History  . Retired-cook [employer]   Social History Main Topics  . Smoking status: Current Every Day Smoker -- 0.3 packs/day for 18 years    Types: Cigarettes    Last Attempt to Quit: 05/10/2011  . Smokeless tobacco: Never Used  . Alcohol Use: No  . Drug Use: No  . Sexually Active: Not on file   Other Topics Concern  . Not on file   Social History Narrative   Health Care POA: Emergency Contact: partner, Raj Janus, [phone] of Life Plan: Who lives with you: granddaughter and partnerAny pets: cat- Wild ThingDiet: pt has a varied diet of protein, starch, and  vegetables Exercise: Pt does not have any regular exercise routineSeatbelts:Pt reports wearing seatbelt when in vehicles. Sun Exposure/Protection: Hobbies: cooking, bingo, basketball     Review of Systems: General: negative for chills, fever, night sweats or weight changes.  Cardiovascular: positive for chest pain and SOB, dyspnea on exertion, edema, orthopnea, palpitations, paroxysmal nocturnal dyspnea Dermatological: negative for rash Respiratory: negative for cough or wheezing Urologic: negative for hematuria Abdominal: positive for nausea, negative for vomiting, diarrhea, bright red blood per rectum, melena, or hematemesis Neurologic: negative for visual changes, syncope, or dizziness All other systems reviewed and are otherwise negative except as noted above.  Physical Exam: Blood pressure 99/57, pulse 83, temperature 98.7 F (37.1 C), temperature source Oral, resp. rate 16, SpO2 92.00%.    General: Appears older than stated age, well nourished, in no acute distress. Head: Normocephalic, atraumatic, sclera non-icteric,  no xanthomas, nares are without discharge.  Neck: Negative for carotid bruits. JVD not elevated. Lungs: Faint exp wheezes, no rales or rhonchi. Breathing is unlabored. Heart: RRR with S1 S2. No murmurs, rubs, or gallops appreciated. Abdomen: Soft, non-tender, non-distended with normoactive bowel sounds. Ileostomy bag noted. No evidence of bleeding or tenderness on palpation. No hepatomegaly. No rebound/guarding.  Msk:  Strength and tone appears normal for age. Extremities: No clubbing, cyanosis or edema.  Distal pedal pulses are 2+ and equal bilaterally. Neuro: Alert and oriented X 3. Moves all extremities spontaneously. Psych:  Responds to questions appropriately with a normal affect.  Labs: Recent Labs  Basename 05/30/12 0800   WBC 9.2   HGB 10.5*   HCT 31.6*   MCV 91.1   PLT 184    Lab 05/30/12 0800  NA 139  K 3.6  CL 109  CO2 18*  BUN 22  CREATININE  1.78*  CALCIUM 9.6  PROT --  BILITOT --  ALKPHOS --  ALT --  AST --  AMYLASE --  LIPASE --  GLUCOSE 101*   Radiology/Studies: Dg Chest 2 View  05/30/2012  *RADIOLOGY REPORT*  Clinical Data: Chest pain, shortness of breath.  CHEST - 2 VIEW  Comparison: December 16, 2011.  Findings: Cardiomediastinal silhouette appears normal.  Bony thorax is intact.  Linear opacities are noted in lingular region most consistent with scarring or subsegmental atelectasis.  IMPRESSION: Lingular linear densities most consistent with scarring or subsegmental atelectasis.   Original Report Authenticated By: Marijo Conception.,  [name]    EKG: NSR, low-voltage Ps, no ST/T changes  ASSESSMENT AND PLAN:   1. CAD- HPI concerning for unstable angina. EKG and initial trop-I in the ED reveal no evidence of ischemia. Patient currently cp free. Patient has had two normal Myoviews in the past 1.5 years. The patient has significant cardiac RFs (HTN, HL, tobacco abuse, family hx). She does have documented coronary atherosclerosis on CT-A in 11/12. She has not undergone invasive ischemic work-up. This was contemplated in 2012, however Myoview was elected, after similar complaints/clinical scenario. After speaking with MD, favor reflux etiology. Given ischemic eval < 6 months ago, will plan to observe, rule out, add PPI on scheduled basis and start Plavix (ASA causes tongue swelling). Continue ARB, BB, statin, Imdur. Add NTG SL PRN. Cycle troponins.  2. Reflux esophagitis/GERD- likely accounting for chest discomfort this AM. As above, start scheduled PPI.   3. 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 EF on most recent echo in 06/13 has improved to WNL. Continue ARB (Cr stable on this) and BB as these likely account for LV systolic improvement. She is euvolemic on exam.   4. Hyperlipidemia- continue statin. Check lipid panel.   5. Hypertension- well-controlled.  Continue antihypertensives.   6. Ongoing tobacco abuse- stressed cessation. Offer assistance in the form of nicotine replacement. Follow-up with PCP.   7. CKD, stage III- see above. Left RAS noted on doppler in 2/13. Cr 1.78 today. Trended CBGs from the past are well-controlled making underlying DM less likely.   8. Normocytic anemia- secondary to #6. No evidence of bleeding. Ileostomy bag contents are not melenotic or with frank blood.  9. COPD- some appreciable wheezing on exam. May benefit from Spiriva +/- SABA. Follow-up and management per PCP.   10. Crohn's disease- stable. Ileostomy functioning properly without apparent complications.  11. Anxiety- continue anxiolytics.    Signed, R. Valeria Batman,  PA-C 05/30/2012, 10:20 AM  Patient seen and examined.  Agree with findings as noted by R Arguello.  Patient is a 61 yo with a hsitory of systolic CHF (though LV function is now normalized), renal insufficiency, COPD, tobacco use and Crohn's disease Presents after waking up this morning with CP  Pain is substernal  She did note a brackish fluid in her mouth this morning  Does have a history GERD  I am not convinced the patient's symptoms are ischemic   Would r/o.  Start PPI (she takes omeprazole at home as needed  Did not take until today)  Chest CT about 1 year ago shows that she has atherosclerosis.  She reports allergy to ASA (with rash and tongue swelling) though she is on Asacol.  Would start plavix for now unless allergy further clarified.  CHF  Normalized LVEF  Continue meds.  Vollume status looks good  HL  Continue statin.  Dorris Carnes

## 2012-05-31 ENCOUNTER — Encounter (HOSPITAL_COMMUNITY): Payer: Self-pay | Admitting: Cardiology

## 2012-05-31 DIAGNOSIS — F411 Generalized anxiety disorder: Secondary | ICD-10-CM

## 2012-05-31 DIAGNOSIS — I2510 Atherosclerotic heart disease of native coronary artery without angina pectoris: Secondary | ICD-10-CM

## 2012-05-31 LAB — CBC
MCH: 30.5 pg (ref 26.0–34.0)
MCHC: 33.8 g/dL (ref 30.0–36.0)
Platelets: 166 10*3/uL (ref 150–400)
RBC: 3.15 MIL/uL — ABNORMAL LOW (ref 3.87–5.11)
RDW: 14.6 % (ref 11.5–15.5)

## 2012-05-31 LAB — BASIC METABOLIC PANEL
CO2: 16 mEq/L — ABNORMAL LOW (ref 19–32)
Calcium: 8.9 mg/dL (ref 8.4–10.5)
GFR calc Af Amer: 31 mL/min — ABNORMAL LOW (ref >90)
GFR calc non Af Amer: 27 mL/min — ABNORMAL LOW (ref >90)
Sodium: 137 mEq/L (ref 135–145)

## 2012-05-31 LAB — LIPID PANEL
Cholesterol: 142 mg/dL (ref 0–200)
Triglycerides: 139 mg/dL (ref <150)

## 2012-05-31 MED ORDER — MESALAMINE 400 MG PO CPDR
800.0000 mg | DELAYED_RELEASE_CAPSULE | Freq: Three times a day (TID) | ORAL | Status: DC
  Administered 2012-05-31 – 2012-06-01 (×4): 800 mg via ORAL
  Filled 2012-05-31 (×6): qty 2

## 2012-05-31 MED ORDER — CLOPIDOGREL BISULFATE 75 MG PO TABS: 75.0000 mg | ORAL_TABLET | Freq: Every day | ORAL | Status: DC

## 2012-05-31 MED ORDER — PANTOPRAZOLE SODIUM 40 MG PO TBEC: 40.0000 mg | DELAYED_RELEASE_TABLET | Freq: Every day | ORAL | Status: DC

## 2012-05-31 MED FILL — Mesalamine Cap DR 400 MG: ORAL | Qty: 4 | Status: AC

## 2012-05-31 NOTE — Progress Notes (Signed)
Patient was discharged at 10:00 this morning, however, reportedly can't find a ride home. She is ok to be discharged as soon as she has transportation.  Mcdaniel Ohms 05/31/2012 6:39 PM

## 2012-05-31 NOTE — Discharge Summary (Signed)
Discharge Summary   Patient ID: [name] [name] MRN: [id number], DOB/AGE: 08-21-1950 61 y.o.  Primary MD: Zigmund Gottron, MD Primary Cardiologist: Dr. Acie Fredrickson Admit date: 05/30/2012 D/C date:     05/31/2012      Primary Discharge Diagnoses:  1. Atypical Chest Pain  - No objective evidence of acute cardiac ischemia  - Most consistent with GERD  - Initiated on Plavix (ASA allergy) given h/o CAD on chest CT 7yrago  - Initiated on Protonix  2. GERD 3. Tobacco Abuse 4. Normocytic Anemia  Secondary Discharge Diagnoses:  . Crohn's disease   . Hypertension   . Hyperlipidemia   . CHF (congestive heart failure)     EF 30-35% 2012->EF 60-65% 2013  . 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 Anxiety   . Panic attacks   . CKD (chronic kidney disease), stage III   . Anemia, chronic renal failure   . Asthma 05/2011  . Headache     "related to high BP" (05/30/2012)  . Abnormal chest CT     Coronary atherosclerosis on chest CT 2012     Allergies Allergies  Allergen Reactions  . Amoxicillin Anaphylaxis and Rash    REACTION:Throat Swelling  . Aspirin Anaphylaxis, Itching and Rash  . Morphine And Related Anaphylaxis  . Penicillins Anaphylaxis and Rash  . Ace Inhibitors Cough    ACE possibly associated with cough and switched to ARB.  Would be OK to re-challenge if needed.  . Gabapentin     Patient had one time seizure shortly after stopping gabapentin    Diagnostic Studies/Procedures:   PA/LATERAL CHEST X-RAY - 05/30/12  IMPRESSION:  Lingular linear densities most consistent with scarring or  subsegmental atelectasis.  History of Present Illness: 61y.o. female w/ the above medical problems [facility]. She reported experiencing sudden left-sided chest pressure/burning with radiation to her left shoulder and arm w/ associated nausea and shortness of breath rated at a 8/10->0/10 for several minutes. She  took an antacid and ativan without improvement. She did note abnormal taste/water brash with this. Given the persistence and severity of her pain, she asked her son to take her to the ED.   Hospital Course: In the ED, EKG revealed no evidence of ischemia. Labs were significant for normal troponin, Cr 1.78, Hgb/hct 10.5/31.6, pBNP 168.6. CXR showed lingular linear densities c/w scarring vs atelectasis. It was felt her symptoms were GI in etiology, but given history of coronary atherosclerosis on chest CT and significant cardiac risk factors she was initiated on Plavix (ASA allergy), a PPI, and placed in observation for MI rule out. Cardiac enzymes were cycled and remained negative. She had no further complaints of chest pain. She was seen and evaluated by Dr. JMartiniquewho felt she was stable for discharge home with plans for follow up as scheduled below. If she continues to have unexplained chest pain she may need cardiac cath, but it would be high risk due to CKD.   Discharge Vitals: Blood pressure 102/49, pulse 67, temperature 98.1 F (36.7 C), temperature source Oral, resp. rate 18, height 5' 6"  (1.676 m), weight 145 lb (65.772 kg), SpO2 100.00%.  Labs: Component Value Date   WBC 7.0 05/31/2012   HGB 9.6* 05/31/2012   HCT 28.4* 05/31/2012   MCV 90.2 05/31/2012   PLT 166 05/31/2012    Lab 05/31/12 0538  NA 137  K 3.7  CL 109  CO2 16*  BUN  28*  CREATININE 1.95*  CALCIUM 8.9  GLUCOSE 98   Basename 05/30/12 2042 05/30/12 1428  TROPONINI <0.30 <0.30   Component Value Date   CHOL 142 05/31/2012   HDL 38* 05/31/2012   LDLCALC 76 05/31/2012   TRIG 139 05/31/2012     05/30/2012 08:00  Pro B Natriuretic peptide (BNP) 168.6 (H)    Discharge Medications     Medication List     As of 05/31/2012 10:17 AM    STOP taking these medications         omeprazole 20 MG capsule   Commonly known as: PRILOSEC   Replaced by: pantoprazole 40 MG tablet      TAKE these medications          calcium-vitamin D 500-200 MG-UNIT per tablet   Commonly known as: OSCAL WITH D   Take 1 tablet by mouth 2 (two) times daily.      citalopram 20 MG tablet   Commonly known as: CELEXA   Take 1 tablet (20 mg total) by mouth daily.      clopidogrel 75 MG tablet   Commonly known as: PLAVIX   Take 1 tablet (75 mg total) by mouth daily with breakfast.      isosorbide mononitrate 30 MG 24 hr tablet   Commonly known as: IMDUR   Take 1 tablet (30 mg total) by mouth at bedtime.      LORazepam 0.5 MG tablet   Commonly known as: ATIVAN   Take 0.5 mg by mouth 2 (two) times daily.      Mesalamine 800 MG Tbec   Take 1 tablet by mouth 3 (three) times daily.      metoprolol succinate 50 MG 24 hr tablet   Commonly known as: TOPROL-XL   Take 50 mg by mouth daily.      multivitamin with minerals Tabs   Take 1 tablet by mouth daily.      pantoprazole 40 MG tablet   Commonly known as: PROTONIX   Take 1 tablet (40 mg total) by mouth daily at 6 (six) AM.      simvastatin 20 MG tablet   Commonly known as: ZOCOR   Take 20 mg by mouth at bedtime.      valsartan 80 MG tablet   Commonly known as: DIOVAN   Take 80 mg by mouth daily.         Disposition   Discharge Orders    Future Appointments: Provider: Department: Dept Phone: Center:   06/22/2012 10:30 AM Thayer Headings, MD [place]) [phone] [id number]     Future Orders Please Complete By Expires   Diet - low sodium heart healthy      Increase activity slowly      Discharge instructions      Comments:   There were no findings to suggest the chest pain is coming from your heart. Please stop smoking and take all medications as prescribed.     Follow-up Information    Follow up with Darden Amber., MD. On 06/22/2012. (10:30)    Contact information:   [facility] [address] [place] [address] [phone]           Outstanding Labs/Studies:  None  Duration of  Discharge Encounter: Greater than 30 minutes including physician and PA time.  Signed, HOPE, JESSICA PA-C 05/31/2012, 10:17 AM  ADDENDUM: Edited to reflect additional inpatient day prior to discharge. Patient was felt stable for discharge on  05/31/12, however there was a conflict regarding transportation. She was evaluated by Dr. Martinique this, felt to be stable for discharge and transportation arrangements have been made. No other changes to her admission course.   Jacquelynn Cree, PA-C 06/01/2012, 8:24 AM

## 2012-05-31 NOTE — Progress Notes (Signed)
TELEMETRY: Reviewed telemetry pt in NSR: Filed Vitals:   05/30/12 2057 05/31/12 0232 05/31/12 0424 05/31/12 0742  BP: 97/49 100/53 102/49   Pulse: 73 80 67   Temp: 97.5 F (36.4 C) 99.4 F (37.4 C) 99.5 F (37.5 C) 98.1 F (36.7 C)  TempSrc: Oral Oral Oral   Resp: 18 16 18    Height:      Weight:   65.772 kg (145 lb)   SpO2: 100% 100% 100%     Intake/Output Summary (Last 24 hours) at 05/31/12 0928 Last data filed at 05/31/12 0900  Gross per 24 hour  Intake    882 ml  Output    400 ml  Net    482 ml    SUBJECTIVE Feels well. No recurrent chest pain. No SOB.  LABS: Basic Metabolic Panel:  Basename 05/31/12 0538 05/30/12 0800  NA 137 139  K 3.7 3.6  CL 109 109  CO2 16* 18*  GLUCOSE 98 101*  BUN 28* 22  CREATININE 1.95* 1.78*  CALCIUM 8.9 9.6  MG -- --  PHOS -- --   CBC:  Basename 05/31/12 0538 05/30/12 0800  WBC 7.0 9.2  NEUTROABS -- 4.8  HGB 9.6* 10.5*  HCT 28.4* 31.6*  MCV 90.2 91.1  PLT 166 184   Cardiac Enzymes:  Basename 05/30/12 2042 05/30/12 1428  CKTOTAL -- --  CKMB -- --  CKMBINDEX -- --  TROPONINI <0.30 <0.30   Fasting Lipid Panel:  Basename 05/31/12 0538  CHOL 142  HDL 38*  LDLCALC 76  TRIG 139  CHOLHDL 3.7  LDLDIRECT --    Radiology/Studies:  Dg Chest 2 View  05/30/2012  *RADIOLOGY REPORT*  Clinical Data: Chest pain, shortness of breath.  CHEST - 2 VIEW  Comparison: December 16, 2011.  Findings: Cardiomediastinal silhouette appears normal.  Bony thorax is intact.  Linear opacities are noted in lingular region most consistent with scarring or subsegmental atelectasis.  IMPRESSION: Lingular linear densities most consistent with scarring or subsegmental atelectasis.   Original Report Authenticated By: Marijo Conception.,  [name]     PHYSICAL EXAM General: Chronically ill BF, in no acute distress. Head: Normocephalic, atraumatic, sclera non-icteric, no xanthomas, nares are without discharge. Neck: Negative for carotid bruits. JVD not  elevated. Lungs: Clear bilaterally to auscultation without wheezes, rales, or rhonchi. Breathing is unlabored. Heart: RRR S1 S2 without murmurs, rubs, or gallops.  Abdomen: Soft, non-tender, non-distended with normoactive bowel sounds. No hepatomegaly. Ileostomy in place. No obvious abdominal masses. Msk:  Strength and tone appears normal for age. Extremities: No clubbing, cyanosis or edema.  Distal pedal pulses are 2+ and equal bilaterally. Neuro: Alert and oriented X 3. Moves all extremities spontaneously. Psych:  Responds to questions appropriately with a normal affect.  ASSESSMENT AND PLAN: 1. Atypical chest pain. History most consistent with GERD. Enzymes all negative. Patient without chest pain since admission. Normal perfusion by myoview < 6 months. Recommend PPI. On plavix now since unable to take ASA. OK for discharge today with outpatient follow up. If patient continues to have unexplained chest pain may need cardiac cath but risk increased due to CKD  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  3. GERD  4. Chronic systolic CHF compensated.  5. Tobacco abuse.  6. Crohn's disease.  Principal Problem:  *Reflux esophagitis Active Problems:  HYPERCHOLESTEROLEMIA  Anxiety  CROHN'S DISEASE  Hypertension, benign  Systolic CHF, chronic  Chronic kidney disease (CKD), stage III (moderate)  CAD (coronary artery disease)  Tobacco abuse  COPD (  chronic obstructive pulmonary disease)  Anemia in chronic kidney disease    Signed, Omid Deardorff Martinique MD,FACC 05/31/2012 9:39 AM

## 2012-05-31 NOTE — Progress Notes (Signed)
Patient d/c changed until tomorrow per no transport home today.  Chinita Greenland, PA informed by Charge Nurse, Lake Bells, RN.

## 2012-06-01 NOTE — Discharge Summary (Signed)
Patient seen and examined and history reviewed. Agree with above findings and plan. See earlier rounding note.  Collier Salina [facility] 06/01/2012 9:12 AM

## 2012-06-01 NOTE — Progress Notes (Signed)
TELEMETRY: Reviewed telemetry pt in NSR: Filed Vitals:   05/31/12 1323 05/31/12 2046 06/01/12 0216 06/01/12 0458  BP: 92/53 102/56 104/53 106/56  Pulse: 85 77 74 87  Temp: 98.2 F (36.8 C) 98.1 F (36.7 C) 98.3 F (36.8 C) 98.3 F (36.8 C)  TempSrc: Oral Oral Oral Oral  Resp: 19 18 18 18   Height:      Weight:    65.3 kg (143 lb 15.4 oz)  SpO2: 98% 100% 98% 99%    Intake/Output Summary (Last 24 hours) at 06/01/12 0800 Last data filed at 06/01/12 0400  Gross per 24 hour  Intake    782 ml  Output    500 ml  Net    282 ml    SUBJECTIVE Feels well. No recurrent chest pain. No SOB. Unable to get ride home yesterday. No change in status.  LABS: Basic Metabolic Panel:  Basename 05/31/12 0538 05/30/12 0800  NA 137 139  K 3.7 3.6  CL 109 109  CO2 16* 18*  GLUCOSE 98 101*  BUN 28* 22  CREATININE 1.95* 1.78*  CALCIUM 8.9 9.6  MG -- --  PHOS -- --   CBC:  Basename 05/31/12 0538 05/30/12 0800  WBC 7.0 9.2  NEUTROABS -- 4.8  HGB 9.6* 10.5*  HCT 28.4* 31.6*  MCV 90.2 91.1  PLT 166 184   Cardiac Enzymes:  Basename 05/30/12 2042 05/30/12 1428  CKTOTAL -- --  CKMB -- --  CKMBINDEX -- --  TROPONINI <0.30 <0.30   Fasting Lipid Panel:  Basename 05/31/12 0538  CHOL 142  HDL 38*  LDLCALC 76  TRIG 139  CHOLHDL 3.7  LDLDIRECT --    Radiology/Studies:  Dg Chest 2 View  05/30/2012  *RADIOLOGY REPORT*  Clinical Data: Chest pain, shortness of breath.  CHEST - 2 VIEW  Comparison: December 16, 2011.  Findings: Cardiomediastinal silhouette appears normal.  Bony thorax is intact.  Linear opacities are noted in lingular region most consistent with scarring or subsegmental atelectasis.  IMPRESSION: Lingular linear densities most consistent with scarring or subsegmental atelectasis.   Original Report Authenticated By: Marijo Conception.,  [name]     PHYSICAL EXAM General: Chronically ill BF, in no acute distress. Head: Normocephalic, atraumatic, sclera non-icteric, no xanthomas,  nares are without discharge. Neck: Negative for carotid bruits. JVD not elevated. Lungs: Clear bilaterally to auscultation without wheezes, rales, or rhonchi. Breathing is unlabored. Heart: RRR S1 S2 without murmurs, rubs, or gallops.  Abdomen: Soft, non-tender, non-distended with normoactive bowel sounds. No hepatomegaly. Ileostomy in place. No obvious abdominal masses. Msk:  Strength and tone appears normal for age. Extremities: No clubbing, cyanosis or edema.  Distal pedal pulses are 2+ and equal bilaterally. Neuro: Alert and oriented X 3. Moves all extremities spontaneously. Psych:  Responds to questions appropriately with a normal affect.  ASSESSMENT AND PLAN: 1. Atypical chest pain. History most consistent with GERD. Enzymes all negative. Patient without chest pain since admission. Normal perfusion by myoview < 6 months. Recommend PPI. On plavix now since unable to take ASA. OK for discharge today with outpatient follow up.  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  3. GERD  4. Chronic systolic CHF compensated.  5. Tobacco abuse.  6. Crohn's disease.  Principal Problem:  *Reflux esophagitis Active Problems:  HYPERCHOLESTEROLEMIA  Anxiety  CROHN'S DISEASE  Hypertension, benign  Systolic CHF, chronic  Chronic kidney disease (CKD), stage III (moderate)  CAD (coronary artery disease)  Tobacco abuse  COPD (chronic obstructive pulmonary disease)  Anemia  in chronic kidney disease    Signed, Srija Southard [place] MD,FACC 06/01/2012 8:00 AM

## 2012-06-01 NOTE — Progress Notes (Signed)
Patient states that grandson will come to pick her up around lunch time. Will notify day nurse at change of shift.

## 2012-06-01 NOTE — Progress Notes (Signed)
IV d/c'd.  Tele d/c'd.  Pt d/c'd to home.  Home meds and d/c instructions have been reviewed with pt.  Pt denies any questions or concerns at this time.  Pt leaving unit via wheelchair and appears in no acute distress.   Gae Gallop RN

## 2012-06-01 NOTE — Progress Notes (Signed)
I Francis Gaines, RN's assessment, med administration, notes, I/O, and care plan/education.

## 2012-06-09 NOTE — ED Provider Notes (Signed)
Medical screening examination/treatment/procedure(s) were performed by non-physician practitioner and as supervising physician I was immediately available for consultation/collaboration.   Alfonzo Feller, DO 06/09/12 1331

## 2012-06-22 ENCOUNTER — Encounter: Payer: Medicare Other | Admitting: Cardiovascular Disease

## 2012-06-23 ENCOUNTER — Other Ambulatory Visit: Payer: Self-pay | Admitting: Family Medicine

## 2012-06-24 ENCOUNTER — Encounter: Payer: Self-pay | Admitting: Cardiovascular Disease

## 2012-07-11 ENCOUNTER — Other Ambulatory Visit: Payer: Self-pay | Admitting: Family Medicine

## 2012-07-27 ENCOUNTER — Ambulatory Visit (INDEPENDENT_AMBULATORY_CARE_PROVIDER_SITE_OTHER): Payer: Medicare Other | Admitting: Family Medicine

## 2012-07-27 ENCOUNTER — Encounter: Payer: Self-pay | Admitting: Family Medicine

## 2012-07-27 VITALS — BP 116/85 | HR 65 | Temp 98.6°F | Wt 146.0 lb

## 2012-07-27 DIAGNOSIS — J209 Acute bronchitis, unspecified: Secondary | ICD-10-CM

## 2012-07-27 MED ORDER — AZITHROMYCIN 250 MG PO TABS: ORAL_TABLET | ORAL | Status: DC

## 2012-07-27 MED ORDER — BENZONATATE 100 MG PO CAPS: 100.0000 mg | ORAL_CAPSULE | Freq: Three times a day (TID) | ORAL | Status: DC | PRN

## 2012-07-27 NOTE — Patient Instructions (Signed)
The following over the counter medications are acceptable for you to take:  Dextromethorphan, Guaifenesin, loratadine, benadryl (diphenhydramine), tylenol. Do not take ibuprofen, advil, motrin or aleve. Avoid pseudophedrine or phenylephrine if possible.  Bronchitis Bronchitis is the body's way of reacting to injury and/or infection (inflammation) of the bronchi. Bronchi are the air tubes that extend from the windpipe into the lungs. If the inflammation becomes severe, it may cause shortness of breath. CAUSES  Inflammation may be caused by:  A virus.  Germs (bacteria).  Dust.  Allergens.  Pollutants and many other irritants. The cells lining the bronchial tree are covered with tiny hairs (cilia). These constantly beat upward, away from the lungs, toward the mouth. This keeps the lungs free of pollutants. When these cells become too irritated and are unable to do their job, mucus begins to develop. This causes the characteristic cough of bronchitis. The cough clears the lungs when the cilia are unable to do their job. Without either of these protective mechanisms, the mucus would settle in the lungs. Then you would develop pneumonia. Smoking is a common cause of bronchitis and can contribute to pneumonia. Stopping this habit is the single most important thing you can do to help yourself. TREATMENT   Your caregiver may prescribe an antibiotic if the cough is caused by bacteria. Also, medicines that open up your airways make it easier to breathe. Your caregiver may also recommend or prescribe an expectorant. It will loosen the mucus to be coughed up. Only take over-the-counter or prescription medicines for pain, discomfort, or fever as directed by your caregiver.  Removing whatever causes the problem (smoking, for example) is critical to preventing the problem from getting worse.  Cough suppressants may be prescribed for relief of cough symptoms.  Inhaled medicines may be prescribed to help  with symptoms now and to help prevent problems from returning.  For those with recurrent (chronic) bronchitis, there may be a need for steroid medicines. SEEK IMMEDIATE MEDICAL CARE IF:   During treatment, you develop more pus-like mucus (purulent sputum).  You have a fever.  Your baby is older than 3 months with a rectal temperature of 102 F (38.9 C) or higher.  Your baby is 37 months old or younger with a rectal temperature of 100.4 F (38 C) or higher.  You become progressively more ill.  You have increased difficulty breathing, wheezing, or shortness of breath. It is necessary to seek immediate medical care if you are elderly or sick from any other disease. MAKE SURE YOU:   Understand these instructions.  Will watch your condition.  Will get help right away if you are not doing well or get worse. Document Released: 06/22/2005 Document Revised: 09/14/2011 Document Reviewed: 05/01/2008 [facility] Patient Information 2013 [place].

## 2012-07-27 NOTE — Progress Notes (Signed)
  Subjective:    Patient ID: [name] [name], female    DOB: [birth year], 62 y.o.   MRN: [id number]  HPI 62 y.o. female with head cold. Sinus congestion, rhinorrhea (clear), cough with yellowish mucous. Sore throat off and on. Chills, no fever. Headache (worsened by noise). Symptoms started 1 week ago. Taking only tylenol (1000 mg twice a day). No wheezing, difficulty breathing.  Smokes around 3 cigarettes a day, 1 pack in 3 weeks. No asthma.  Review of Systems  Constitutional: Positive for chills. Negative for fever and diaphoresis.  HENT: Positive for congestion, sore throat, rhinorrhea and postnasal drip. Negative for hearing loss, ear pain and neck pain.   Eyes: Negative for pain and itching.  Respiratory: Positive for cough and wheezing.   Cardiovascular: Negative for chest pain and palpitations.       Objective:   Physical Exam  Constitutional: [name] [name] is oriented to person, place, and time. [name] [name] appears well-developed and well-nourished. No distress.  HENT:  Head: Normocephalic.  Mouth/Throat: Oropharynx is clear and moist.  Eyes: Conjunctivae normal and EOM are normal.  Neck: Normal range of motion. Neck supple.  Cardiovascular: Normal rate, regular rhythm and normal heart sounds.   Pulmonary/Chest: Effort normal and breath sounds normal. No respiratory distress.  Musculoskeletal: [name] [name] exhibits no edema and no tenderness.  Lymphadenopathy:    [name] [name] has no cervical adenopathy.  Neurological: [name] [name] is alert and oriented to person, place, and time.  Skin: Skin is warm and dry.  Psychiatric: [name] [name] has a normal mood and affect.   Filed Vitals:   07/27/12 1424  BP: 116/85  Pulse: 65  Temp: 98.6 F (37 C)       Assessment & Plan:  62 y.o. female with COPD (per chart), cough - Viral URI, has lasted > 7 days with increasing cough/sputum production. - Azithromycin (allergic to PCNs), tessalon, supportive measures  Martha Clan, MD

## 2012-08-01 ENCOUNTER — Other Ambulatory Visit: Payer: Self-pay | Admitting: Family Medicine

## 2012-08-03 ENCOUNTER — Encounter: Payer: Self-pay | Admitting: Family Medicine

## 2012-08-03 ENCOUNTER — Ambulatory Visit (INDEPENDENT_AMBULATORY_CARE_PROVIDER_SITE_OTHER): Payer: Medicare Other | Admitting: Family Medicine

## 2012-08-03 VITALS — BP 120/62 | HR 78 | Temp 98.0°F | Ht 66.0 in | Wt 141.6 lb

## 2012-08-03 DIAGNOSIS — B9789 Other viral agents as the cause of diseases classified elsewhere: Secondary | ICD-10-CM

## 2012-08-03 DIAGNOSIS — B349 Viral infection, unspecified: Secondary | ICD-10-CM

## 2012-08-03 DIAGNOSIS — E7800 Pure hypercholesterolemia, unspecified: Secondary | ICD-10-CM

## 2012-08-03 DIAGNOSIS — I10 Essential (primary) hypertension: Secondary | ICD-10-CM

## 2012-08-03 MED ORDER — SIMVASTATIN 20 MG PO TABS: 20.0000 mg | ORAL_TABLET | Freq: Every day | ORAL | Status: DC

## 2012-08-03 MED ORDER — METOPROLOL SUCCINATE ER 50 MG PO TB24: 50.0000 mg | ORAL_TABLET | Freq: Every day | ORAL | Status: DC

## 2012-08-03 NOTE — Assessment & Plan Note (Signed)
Refill protonix

## 2012-08-03 NOTE — Patient Instructions (Signed)
You seem to have a virus that is taking a while for your body to get rid of.  If you are still sick in another 2 weeks, see me again and I will run tests on you.   I refilled your medicines. No changes in your medicines.   You don't need to lose any more weight.  You are at a healthy weight right now.   See me in 2 months for a pap test.   Your mammogram was fine.

## 2012-08-04 ENCOUNTER — Encounter: Payer: Self-pay | Admitting: Cardiovascular Disease

## 2012-08-04 ENCOUNTER — Ambulatory Visit (INDEPENDENT_AMBULATORY_CARE_PROVIDER_SITE_OTHER): Payer: Medicare Other | Admitting: Cardiovascular Disease

## 2012-08-04 VITALS — BP 132/82 | HR 77 | Ht 66.0 in | Wt 142.1 lb

## 2012-08-04 DIAGNOSIS — B349 Viral infection, unspecified: Secondary | ICD-10-CM | POA: Insufficient documentation

## 2012-08-04 DIAGNOSIS — N183 Chronic kidney disease, stage 3 unspecified: Secondary | ICD-10-CM

## 2012-08-04 DIAGNOSIS — E7800 Pure hypercholesterolemia, unspecified: Secondary | ICD-10-CM

## 2012-08-04 MED ORDER — VALSARTAN 80 MG PO TABS: 80.0000 mg | ORAL_TABLET | Freq: Every day | ORAL | Status: DC

## 2012-08-04 NOTE — Assessment & Plan Note (Signed)
Prolonged.  Will observe.

## 2012-08-04 NOTE — Progress Notes (Signed)
[name] [name] Date of Birth  Dec 13, 1950 [facility] [address]    [place], [place]  [address] [phone]  Fax  [phone]  Problems: 1. Congestive heart failure-EF of 30-35%,  EF has now improved Left ventricle: 12/07/2011-- The cavity size was normal. Wall thickness was normal. Systolic function was normal. The estimated ejection fraction was in the range of 60% to 65%. - Aortic valve: Trivial regurgitation. - Mitral valve: Moderate regurgitation.  normal myoview in May , 2012 1. Chronic renal insufficiency 2. Chronic anemia  History of Present Illness:  This is a 62 year old female with a  diagnosis of congestive heart failure.   She's been on medical therapy and her ejection fraction has increased from 35% up to 60-65%.  She has done well from a medical standpoint.  No dyspnea. No CP.   Current Outpatient Prescriptions on File Prior to Visit  Medication Sig Dispense Refill  . benzonatate (TESSALON) 100 MG capsule Take 1 capsule (100 mg total) by mouth 3 (three) times daily as needed for cough.  15 capsule  0  . citalopram (CELEXA) 20 MG tablet take 1 tablet by mouth once daily  90 tablet  3  . clopidogrel (PLAVIX) 75 MG tablet Take 1 tablet (75 mg total) by mouth daily with breakfast.  30 tablet  6  . isosorbide mononitrate (IMDUR) 30 MG 24 hr tablet take 1 tablet by mouth at bedtime  90 tablet  3  . LORazepam (ATIVAN) 0.5 MG tablet take 1 tablet by mouth twice a day for anxiety  60 tablet  5  . Mesalamine 800 MG TBEC Take 1 tablet by mouth 3 (three) times daily.      . metoprolol succinate (TOPROL-XL) 50 MG 24 hr tablet Take 1 tablet (50 mg total) by mouth daily. Take with or immediately following a meal.  90 tablet  3  . pantoprazole (PROTONIX) 40 MG tablet Take 1 tablet (40 mg total) by mouth daily at 6 (six) AM.  30 tablet  6  . simvastatin (ZOCOR) 20 MG tablet Take 1 tablet (20 mg total) by mouth at bedtime.  90 tablet  3  . valsartan (DIOVAN) 80  MG tablet Take 80 mg by mouth daily.      . calcium-vitamin D (OSCAL 500/200 D-3) 500-200 MG-UNIT per tablet Take 1 tablet by mouth 2 (two) times daily.      . Multiple Vitamin (MULITIVITAMIN WITH MINERALS) TABS Take 1 tablet by mouth daily.        Allergies  Allergen Reactions  . Amoxicillin Anaphylaxis and Rash    REACTION:Throat Swelling  . Aspirin Anaphylaxis, Itching and Rash  . Morphine And Related Anaphylaxis  . Penicillins Anaphylaxis and Rash  . Ace Inhibitors Cough    ACE possibly associated with cough and switched to ARB.  Would be OK to re-challenge if needed.  . Gabapentin     Patient had one time seizure shortly after stopping gabapentin    Past Medical History  Diagnosis Date  . Crohn's disease   . Hypertension   . Hyperlipidemia   . CHF (congestive heart failure)     EF 30-35% 2012->EF 60-65% 2013  . GERD (gastroesophageal reflux disease)   . 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 Anxiety   . Panic attacks   . CKD (chronic kidney disease), stage III   . Anemia, chronic renal failure   . Asthma 05/2011  . Headache     "related to high BP" (  05/30/2012)  . Tobacco abuse   . Abnormal chest CT     Coronary atherosclerosis on chest CT 2012    Past Surgical History  Procedure Date  . 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 Ectopic pregnancy surgery ?1980's    left  . Cholecystectomy 01/28/2005  . Cataract extraction w/ intraocular lens  implant, bilateral ~ 2000    History  Smoking status  . Current Every Day Smoker -- 0.1 packs/day for 18 years  . Types: Cigarettes  Smokeless tobacco  . Never Used    History  Alcohol Use  . Yes    Comment: 05/30/2012 "used to drink back in the day; last alcohol 23 yr ago"    Family History  Problem Relation Age of Onset  . Heart disease Mother   . Stroke Father     Reviw of Systems:  Reviewed in the HPI.  All other systems are negative.  Physical Exam: BP 132/82  Pulse 77  Ht 5' 6"  (1.676 m)  Wt 142 lb 1.9  oz (64.465 kg)  BMI 22.94 kg/m2  SpO2 100% The patient is alert and oriented x 3.  The mood and affect are normal.   Skin: warm and dry.  Color is normal.    HEENT:   Normocephalic/atraumatic. She has normal carotids. There is no JVD.  Lungs: Lung exam is clear.   Heart: Regular rate S1-S2.    Abdomen: She has colostomy on her left side.  +BS. nontender  Extremities:  She has no clubbing cyanosis or edema.  Neuro:  There exam is nonfocal.    ECG:  Assessment / Plan:

## 2012-08-04 NOTE — Assessment & Plan Note (Signed)
Left ventricular systolic function has improved over the past 3 years. Unfortunately, her renal function has deteriated and is now 1.95. I think we should discontinue the Diovan at this point. We will need to watch her blood pressure fairly closely. We will have her get repeat basic metabolic profile in one month which she will have drawn at the family practice Center. I'll see her again in 3 months. I've advised her to stop smoking which will help keep her blood pressure lower

## 2012-08-04 NOTE — Progress Notes (Signed)
  Subjective:    Patient ID: [name] [name], female    DOB: 01/30/1951, 62 y.o.   MRN: [id number]  HPI Patient now 10 days into a respiratory illness.  Already received a z pac without benefit.  Not short of breath or no fever.  Mostly dry cough.    Reviewed wt.  Weight loss has been voluntary  Will be due for pap.  Last on had ASCUS.  Will get at follow up.    Review of Systems     Objective:   Physical Exam Tms nol Throat mild errythema Neck supple with sig adenopathy. Lungs clear. No wheeze or rhonchi.       Assessment & Plan:

## 2012-08-04 NOTE — Patient Instructions (Addendum)
Your physician recommends that you schedule a follow-up appointment in: 3 month  Your physician has recommended you make the following change in your medication: hold diovan for now  Your physician recommends that you return for lab work in: bmet 1 month with your family practice

## 2012-08-04 NOTE — Assessment & Plan Note (Addendum)
Her creatinine is now up to 1.95. She'll probably need further evaluation of her kidneys at her family practice office.  We have discontinued the Diovan.  I've ordered a repeat basic metabolic profile to be drawn one month. She'll have that drawn at the family practice  Center

## 2012-09-18 ENCOUNTER — Emergency Department (HOSPITAL_COMMUNITY): Payer: Medicare Other

## 2012-09-18 ENCOUNTER — Emergency Department (HOSPITAL_COMMUNITY)
Admission: EM | Admit: 2012-09-18 | Discharge: 2012-09-18 | Disposition: A | Discharge status: Home / Self Care | Payer: Medicare Other | Attending: Emergency Medicine | Admitting: Emergency Medicine

## 2012-09-18 ENCOUNTER — Encounter (HOSPITAL_COMMUNITY): Payer: Self-pay

## 2012-09-18 DIAGNOSIS — F17200 Nicotine dependence, unspecified, uncomplicated: Secondary | ICD-10-CM | POA: Insufficient documentation

## 2012-09-18 DIAGNOSIS — I509 Heart failure, unspecified: Secondary | ICD-10-CM | POA: Insufficient documentation

## 2012-09-18 DIAGNOSIS — Z8679 Personal history of other diseases of the circulatory system: Secondary | ICD-10-CM | POA: Insufficient documentation

## 2012-09-18 DIAGNOSIS — F411 Generalized anxiety disorder: Secondary | ICD-10-CM | POA: Insufficient documentation

## 2012-09-18 DIAGNOSIS — I129 Hypertensive chronic kidney disease with stage 1 through stage 4 chronic kidney disease, or unspecified chronic kidney disease: Secondary | ICD-10-CM | POA: Insufficient documentation

## 2012-09-18 DIAGNOSIS — J45909 Unspecified asthma, uncomplicated: Secondary | ICD-10-CM | POA: Insufficient documentation

## 2012-09-18 DIAGNOSIS — Z8719 Personal history of other diseases of the digestive system: Secondary | ICD-10-CM | POA: Insufficient documentation

## 2012-09-18 DIAGNOSIS — K219 Gastro-esophageal reflux disease without esophagitis: Secondary | ICD-10-CM | POA: Insufficient documentation

## 2012-09-18 DIAGNOSIS — E785 Hyperlipidemia, unspecified: Secondary | ICD-10-CM | POA: Insufficient documentation

## 2012-09-18 DIAGNOSIS — F419 Anxiety disorder, unspecified: Secondary | ICD-10-CM

## 2012-09-18 DIAGNOSIS — R079 Chest pain, unspecified: Secondary | ICD-10-CM

## 2012-09-18 DIAGNOSIS — Z79899 Other long term (current) drug therapy: Secondary | ICD-10-CM | POA: Insufficient documentation

## 2012-09-18 DIAGNOSIS — N183 Chronic kidney disease, stage 3 unspecified: Secondary | ICD-10-CM | POA: Insufficient documentation

## 2012-09-18 DIAGNOSIS — Z862 Personal history of diseases of the blood and blood-forming organs and certain disorders involving the immune mechanism: Secondary | ICD-10-CM | POA: Insufficient documentation

## 2012-09-18 LAB — COMPREHENSIVE METABOLIC PANEL
ALT: 22 U/L (ref 0–35)
AST: 20 U/L (ref 0–37)
Alkaline Phosphatase: 121 U/L — ABNORMAL HIGH (ref 39–117)
CO2: 16 mEq/L — ABNORMAL LOW (ref 19–32)
Chloride: 112 mEq/L (ref 96–112)
Creatinine, Ser: 1.79 mg/dL — ABNORMAL HIGH (ref 0.50–1.10)
GFR calc non Af Amer: 30 mL/min — ABNORMAL LOW (ref >90)
Potassium: 3.8 mEq/L (ref 3.5–5.1)
Total Bilirubin: 0.3 mg/dL (ref 0.3–1.2)

## 2012-09-18 LAB — POCT I-STAT TROPONIN I

## 2012-09-18 LAB — CBC
MCV: 92.4 fL (ref 78.0–100.0)
Platelets: 154 10*3/uL (ref 150–400)
RBC: 3.14 MIL/uL — ABNORMAL LOW (ref 3.87–5.11)
WBC: 6.7 10*3/uL (ref 4.0–10.5)

## 2012-09-18 MED ORDER — SODIUM CHLORIDE 0.9 % IV SOLN: 20.0000 mL | INTRAVENOUS | Status: DC

## 2012-09-18 NOTE — ED Notes (Signed)
Pt. Was woken up with mid-sternal chest pain,  Non-radiating, sharp pain.  Became nauseated en-route,  Skin is warm a and dry.  Has a hx of anxiety, was very sob. But sats 100% .  Pt. Given 2 nitro, went from a 10/10 to a 6/10.  Allergic to aspirin

## 2012-09-18 NOTE — ED Notes (Signed)
Phlebotomy called for troponin draw.

## 2012-09-18 NOTE — ED Notes (Signed)
PT ambulated with baseline gait; VSS; A&Ox3; no signs of distress; respirations even and unlabored; skin warm and dry; no questions upon discharge.  

## 2012-09-18 NOTE — ED Provider Notes (Signed)
History     CSN: [id number]  Arrival date & time 09/18/12  6269   First MD Initiated Contact with Patient 09/18/12 [phone]      Chief Complaint  Patient presents with  . Chest Pain    (Consider location/radiation/quality/duration/timing/severity/associated sxs/prior treatment) HPI Comments: [name] [name] is a 62 y.o. Female who presents for evaluation of chest pain. The pain started about 30 minutes ago. It awoke her from sleep. It is sharp, central, and persistent. The pain was 8/10, and improved to 6/10 with nitroglycerin, x2. She is not given aspirin because she is allergic to it. She denies fever, chills, cough, shortness of breath, weakness, or dizziness. She has a rash on both legs for one week. She had no problems yesterday. She slept well during the night until this morning. There are no other modifying factors.  Patient is a 62 y.o. female presenting with chest pain. The history is provided by the patient.  Chest Pain   Past Medical History  Diagnosis Date  . Crohn's disease   . Hypertension   . Hyperlipidemia   . CHF (congestive heart failure)     EF 30-35% 2012->EF 60-65% 2013  . GERD (gastroesophageal reflux disease)   . 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 Anxiety   . Panic attacks   . CKD (chronic kidney disease), stage III   . Anemia, chronic renal failure   . Asthma 05/2011  . Headache     "related to high BP" (05/30/2012)  . Tobacco abuse   . Abnormal chest CT     Coronary atherosclerosis on chest CT 2012    Past Surgical History  Procedure Laterality Date  . 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 Ectopic pregnancy surgery  ?1980's    left  . Cholecystectomy  01/28/2005  . Cataract extraction w/ intraocular lens  implant, bilateral  ~ 2000    Family History  Problem Relation Age of Onset  . Heart disease Mother   . Stroke Father     History  Substance Use Topics  . Smoking status: Current Every Day Smoker -- 0.12 packs/day for 18 years    Types:  Cigarettes  . Smokeless tobacco: Never Used  . Alcohol Use: Yes     Comment: 05/30/2012 "used to drink back in the day; last alcohol 23 yr ago"    OB History   Grav Para Term Preterm Abortions TAB SAB Ect Mult Living                  Review of Systems  Cardiovascular: Positive for chest pain.  All other systems reviewed and are negative.    Allergies  Amoxicillin; Aspirin; Morphine and related; Penicillins; Ace inhibitors; and Gabapentin  Home Medications   Current Outpatient Rx  Name  Route  Sig  Dispense  Refill  . benzonatate (TESSALON) 100 MG capsule   Oral   Take 100 mg by mouth 3 (three) times daily as needed for cough.         . calcium-vitamin D (OSCAL 500/200 D-3) 500-200 MG-UNIT per tablet   Oral   Take 1 tablet by mouth 2 (two) times daily.         . citalopram (CELEXA) 20 MG tablet      take 1 tablet by mouth once daily   90 tablet   3   . clopidogrel (PLAVIX) 75 MG tablet   Oral   Take 1 tablet (75 mg total) by mouth daily with  breakfast.   30 tablet   6   . isosorbide mononitrate (IMDUR) 30 MG 24 hr tablet      take 1 tablet by mouth at bedtime   90 tablet   3   . LORazepam (ATIVAN) 0.5 MG tablet   Oral   Take 0.5 mg by mouth 2 (two) times daily.         . Mesalamine 800 MG TBEC   Oral   Take 1 tablet by mouth 3 (three) times daily.         . metoprolol succinate (TOPROL-XL) 50 MG 24 hr tablet   Oral   Take 50 mg by mouth daily.         . Multiple Vitamin (MULITIVITAMIN WITH MINERALS) TABS   Oral   Take 1 tablet by mouth daily.         . pantoprazole (PROTONIX) 40 MG tablet   Oral   Take 1 tablet (40 mg total) by mouth daily at 6 (six) AM.   30 tablet   6   . simvastatin (ZOCOR) 20 MG tablet   Oral   Take 1 tablet (20 mg total) by mouth at bedtime.   90 tablet   3   . valsartan (DIOVAN) 80 MG tablet   Oral   Take 1 tablet (80 mg total) by mouth daily.           Medication on hold     BP 113/72  Pulse  87  Temp(Src) 98.4 F (36.9 C) (Oral)  Resp 17  SpO2 100%  Physical Exam  Nursing note and vitals reviewed. Constitutional: She is oriented to person, place, and time. She appears well-developed and well-nourished.  HENT:  Head: Normocephalic and atraumatic.  Eyes: Conjunctivae and EOM are normal. Pupils are equal, round, and reactive to light.  Neck: Normal range of motion and phonation normal. Neck supple.  Cardiovascular: Normal rate, regular rhythm and intact distal pulses.   Pulmonary/Chest: Effort normal and breath sounds normal. She exhibits no tenderness.  Abdominal: Soft. She exhibits no distension. There is no tenderness. There is no guarding.  Musculoskeletal: Normal range of motion.  Neurological: She is alert and oriented to person, place, and time. She has normal strength. She exhibits normal muscle tone.  Skin: Skin is warm and dry.  Psychiatric: Her behavior is normal. Judgment and thought content normal.  Anxious    ED Course  Procedures (including critical care time)  Reevaluation at discharge, no further chest pain, and no other complaints. Vital signs remained normal.   Date: 04/22/2012  Rate: 90  Rhythm: normal sinus rhythm  QRS Axis: normal  PR and QT Intervals: normal  ST/T Wave abnormalities: normal  PR and QRS Conduction Disutrbances:none  Narrative Interpretation:   Old EKG Reviewed: unchanged   Labs Reviewed  CBC - Abnormal; Notable for the following:    RBC 3.14 (*)    Hemoglobin 9.8 (*)    HCT 29.0 (*)    All other components within normal limits  COMPREHENSIVE METABOLIC PANEL - Abnormal; Notable for the following:    CO2 16 (*)    Creatinine, Ser 1.79 (*)    Alkaline Phosphatase 121 (*)    GFR calc non Af Amer 30 (*)    GFR calc Af Amer 35 (*)    All other components within normal limits  POCT I-STAT TROPONIN I  POCT I-STAT TROPONIN I   Dg Chest Portable 1 View  09/18/2012  *RADIOLOGY REPORT*  Clinical Data:  Left-sided chest pain.   History of smoking and hypertension.  PORTABLE CHEST - 1 VIEW  Comparison: 05/30/2012  Findings: Artifact overlies the chest.  Heart size is normal. Mediastinal shadows are unremarkable.  There is mild pulmonary scarring but no evidence of active disease.  No edema.  No effusions.  No significant bony finding.  IMPRESSION: Mild pulmonary scarring.  No active disease.   Original Report Authenticated By: Nelson Chimes, [name]    Nursing Notes Reviewed/ Care Coordinated, and agree without changes. Applicable Imaging Reviewed Interpretation of Laboratory Data incorporated into ED treatment  1. Chest pain   2. Anxiety       MDM  Nonspecific chest pain, atypical for cardiac disease, with negative EGD evaluation. She is history of coronary artery disease. I think is very unlikely that she has an ACS. Doubt metabolic instability, serious bacterial infection or impending vascular collapse; the patient is stable for discharge.     Plan: Home Medications- usual; Home Treatments- rest; Recommended follow up- PCP for checkup in one week    Richarda Blade, MD 09/18/12 [phone]

## 2012-09-18 NOTE — ED Notes (Signed)
Patient is resting comfortably. 

## 2012-09-29 ENCOUNTER — Encounter: Payer: Self-pay | Admitting: Home Health Services

## 2012-09-30 ENCOUNTER — Ambulatory Visit: Payer: Medicare Other | Admitting: Family Medicine

## 2012-10-12 ENCOUNTER — Encounter: Payer: Self-pay | Admitting: Family Medicine

## 2012-10-12 ENCOUNTER — Other Ambulatory Visit (HOSPITAL_COMMUNITY)
Admission: RE | Admit: 2012-10-12 | Discharge: 2012-10-12 | Disposition: A | Discharge status: Home / Self Care | Payer: Medicare Other | Source: Ambulatory Visit | Attending: Family Medicine | Admitting: Family Medicine

## 2012-10-12 ENCOUNTER — Ambulatory Visit (INDEPENDENT_AMBULATORY_CARE_PROVIDER_SITE_OTHER): Payer: Medicare Other | Admitting: Family Medicine

## 2012-10-12 VITALS — BP 98/61 | HR 62 | Temp 98.7°F | Ht 66.0 in | Wt 145.0 lb

## 2012-10-12 DIAGNOSIS — I10 Essential (primary) hypertension: Secondary | ICD-10-CM

## 2012-10-12 DIAGNOSIS — R6889 Other general symptoms and signs: Secondary | ICD-10-CM

## 2012-10-12 DIAGNOSIS — IMO0002 Reserved for concepts with insufficient information to code with codable children: Secondary | ICD-10-CM

## 2012-10-12 DIAGNOSIS — Z1151 Encounter for screening for human papillomavirus (HPV): Secondary | ICD-10-CM | POA: Insufficient documentation

## 2012-10-12 DIAGNOSIS — E7800 Pure hypercholesterolemia, unspecified: Secondary | ICD-10-CM

## 2012-10-12 DIAGNOSIS — Z124 Encounter for screening for malignant neoplasm of cervix: Secondary | ICD-10-CM | POA: Insufficient documentation

## 2012-10-12 LAB — CBC
HCT: 31.3 % — ABNORMAL LOW (ref 36.0–46.0)
Hemoglobin: 10.5 g/dL — ABNORMAL LOW (ref 12.0–15.0)
MCH: 30.9 pg (ref 26.0–34.0)
MCV: 92.1 fL (ref 78.0–100.0)
RBC: 3.4 MIL/uL — ABNORMAL LOW (ref 3.87–5.11)
WBC: 6.9 10*3/uL (ref 4.0–10.5)

## 2012-10-12 NOTE — Progress Notes (Signed)
Subjective:    [name] [name] is a 62 y.o. female who presents to [facility] today with complaints of Pap smear and blood work:  1.  Pap smear: Last was "many years ago". She previously had ASCUS. She has not noted any vaginal bleeding. No vaginal discharge. No abdominal pain or nausea/vomiting.  #2. Followup on blood work: Patient was seen by her cardiologist about 2 weeks ago and recommended to stop her Diovan secondary to worsening creatinine. She was also recommended to have a followup basic metabolic profile in one month at family practice Center. She is urinating well. No back pain. No abdominal pain.   The following portions of the patient's history were reviewed and updated as appropriate: allergies, current medications, past medical history, family and social history, and problem list. Patient is a nonsmoker.    PMH reviewed.  Past Medical History  Diagnosis Date  . Crohn's disease   . Hypertension   . Hyperlipidemia   . CHF (congestive heart failure)     EF 30-35% 2012->EF 60-65% 2013  . GERD (gastroesophageal reflux disease)   . 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 Anxiety   . Panic attacks   . CKD (chronic kidney disease), stage III   . Anemia, chronic renal failure   . Asthma 05/2011  . Headache     "related to high BP" (05/30/2012)  . Tobacco abuse   . Abnormal chest CT     Coronary atherosclerosis on chest CT 2012   Past Surgical History  Procedure Laterality Date  . 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 Ectopic pregnancy surgery  ?1980's    left  . Cholecystectomy  01/28/2005  . Cataract extraction w/ intraocular lens  implant, bilateral  ~ 2000    Medications reviewed. Current Outpatient Prescriptions  Medication Sig Dispense Refill  . benzonatate (TESSALON) 100 MG capsule Take 100 mg by mouth 3 (three) times daily as needed for cough.      . citalopram (CELEXA) 20 MG tablet take 1 tablet by mouth once daily  90 tablet  3  . clopidogrel (PLAVIX) 75 MG tablet Take 1  tablet (75 mg total) by mouth daily with breakfast.  30 tablet  6  . isosorbide mononitrate (IMDUR) 30 MG 24 hr tablet take 1 tablet by mouth at bedtime  90 tablet  3  . LORazepam (ATIVAN) 0.5 MG tablet Take 0.5 mg by mouth 2 (two) times daily.      . Mesalamine 800 MG TBEC Take 1 tablet by mouth 3 (three) times daily.      . metoprolol succinate (TOPROL-XL) 50 MG 24 hr tablet Take 50 mg by mouth daily.      . Multiple Vitamin (MULITIVITAMIN WITH MINERALS) TABS Take 1 tablet by mouth daily.      . pantoprazole (PROTONIX) 40 MG tablet Take 1 tablet (40 mg total) by mouth daily at 6 (six) AM.  30 tablet  6  . simvastatin (ZOCOR) 20 MG tablet Take 1 tablet (20 mg total) by mouth at bedtime.  90 tablet  3  . valsartan (DIOVAN) 80 MG tablet Take 1 tablet (80 mg total) by mouth daily.       No current facility-administered medications for this visit.    ROS as above otherwise neg.  No chest pain, palpitations, SOB, Fever, Chills, Abd pain, N/V/D.   Objective:   Physical Exam BP 98/61  Pulse 62  Temp(Src) 98.7 F (37.1 C) (Oral)  Ht 5' 6"  (1.676 m)  Wt 145 lb (65.772 kg)  BMI 23.41 kg/m2 Gen:  Alert, cooperative patient who appears stated age in no acute distress.  Vital signs reviewed. HEENT: EOMI,  MMM Cardiac:  Regular rate and rhythm without murmur auscultated.  Good S1/S2. Pulm:  Clear to auscultation bilaterally with good air movement.  No wheezes or rales noted.   Abd:  Soft/nondistended/nontender.  Good bowel sounds throughout all four quadrants.  No masses noted.  GYN:  External genitalia within normal limits.  Vaginal mucosa pink, moist, normal rugae.  Somewhat friable cervix without lesions, no discharge or bleeding noted on speculum exam.  Pap smear performed.  Some spotting noted after Pap obtained.  Bimanual exam revealed normal, nongravid uterus.  No cervical motion tenderness. No adnexal masses bilaterally.     No results found for this or any previous visit (from the past 72  hour(s)).

## 2012-10-13 LAB — BASIC METABOLIC PANEL
BUN: 26 mg/dL — ABNORMAL HIGH (ref 6–23)
CO2: 18 mEq/L — ABNORMAL LOW (ref 19–32)
Calcium: 9.5 mg/dL (ref 8.4–10.5)
Chloride: 112 mEq/L (ref 96–112)
Creat: 1.91 mg/dL — ABNORMAL HIGH (ref 0.50–1.10)
Glucose, Bld: 85 mg/dL (ref 70–99)

## 2012-10-13 NOTE — Assessment & Plan Note (Signed)
Increase over baseline around 1.4 , has increased to 1.9 recently. Rechecking BMP today for creatinine.

## 2012-10-13 NOTE — Assessment & Plan Note (Signed)
At goal, somewhat low today. No red flags or lightheadedness.

## 2012-10-13 NOTE — Assessment & Plan Note (Signed)
Pap smear repeated today. We'll followup on results.

## 2012-10-18 ENCOUNTER — Encounter: Payer: Self-pay | Admitting: Cardiovascular Disease

## 2012-10-18 ENCOUNTER — Ambulatory Visit (INDEPENDENT_AMBULATORY_CARE_PROVIDER_SITE_OTHER): Payer: Medicare Other | Admitting: Cardiovascular Disease

## 2012-10-18 VITALS — BP 132/74 | HR 72 | Ht 66.0 in | Wt 144.8 lb

## 2012-10-18 DIAGNOSIS — I5022 Chronic systolic (congestive) heart failure: Secondary | ICD-10-CM

## 2012-10-18 DIAGNOSIS — I2510 Atherosclerotic heart disease of native coronary artery without angina pectoris: Secondary | ICD-10-CM

## 2012-10-18 DIAGNOSIS — I509 Heart failure, unspecified: Secondary | ICD-10-CM

## 2012-10-18 NOTE — Assessment & Plan Note (Signed)
[name] [name] presented to the hospital in November with chest pain. Because of her renal insufficiency we have elected not to pursue cardiac catheterization. Restart her on Plavix at that time. She's allergic to aspirin. We have also had her on isosorbide.  We will hold off and not consider cardiac catheterization as long as she is pain-free given her renal insufficiency. I'll see her again in 6 months.

## 2012-10-18 NOTE — Assessment & Plan Note (Signed)
[name] [name]  seems to be doing very well. Her left ventricular systolic function has improved. We'll continue with medical therapy including metoprolol. We have held her Diovan because of her renal insufficiency.  She's not having any shortness of breath. She denies any chest pain.

## 2012-10-18 NOTE — Progress Notes (Signed)
[name] [name] Date of Birth  1951/01/27 [facility] [address]    [place], [place]  [address] [phone]  Fax  [phone]  Problems: 1. Congestive heart failure-EF of 30-35%,  EF has now improved Left ventricle: 12/07/2011-- The cavity size was normal. Wall thickness was normal. Systolic function was normal. The estimated ejection fraction was in the range of 60% to 65%. - Aortic valve: Trivial regurgitation. - Mitral valve: Moderate regurgitation.  normal myoview in May , 2012 1. Chronic renal insufficiency 2. Chronic anemia 4. possible coronary artery disease-the patient presented with chest pain in November, 2013. Because of her renal insufficiency, we decided not to pursue cardiac catheterization but had continued medical therapy. It was at this time that she was started on Plavix and isosorbide.  History of Present Illness:  This is a 62 year old female with a  diagnosis of congestive heart failure.   She's been on medical therapy and her ejection fraction has increased from 35% up to 60-65%.  She has done well from a medical standpoint.  No dyspnea. No CP.  October 18, 2013:  She is doing well .  No CP or dyspnea.  She had some labs recently at the family practice [facility]. Her creatinine is up to 1.9.  She has n   Current Outpatient Prescriptions on File Prior to Visit  Medication Sig Dispense Refill  . benzonatate (TESSALON) 100 MG capsule Take 100 mg by mouth 3 (three) times daily as needed for cough.      . citalopram (CELEXA) 20 MG tablet take 1 tablet by mouth once daily  90 tablet  3  . clopidogrel (PLAVIX) 75 MG tablet Take 1 tablet (75 mg total) by mouth daily with breakfast.  30 tablet  6  . isosorbide mononitrate (IMDUR) 30 MG 24 hr tablet take 1 tablet by mouth at bedtime  90 tablet  3  . LORazepam (ATIVAN) 0.5 MG tablet Take 0.5 mg by mouth 2 (two) times daily.      . Mesalamine 800 MG TBEC Take 1 tablet by mouth 3 (three) times daily.       . metoprolol succinate (TOPROL-XL) 50 MG 24 hr tablet Take 50 mg by mouth daily.      . Multiple Vitamin (MULITIVITAMIN WITH MINERALS) TABS Take 1 tablet by mouth daily.      . pantoprazole (PROTONIX) 40 MG tablet Take 1 tablet (40 mg total) by mouth daily at 6 (six) AM.  30 tablet  6  . simvastatin (ZOCOR) 20 MG tablet Take 1 tablet (20 mg total) by mouth at bedtime.  90 tablet  3   No current facility-administered medications on file prior to visit.    Allergies  Allergen Reactions  . Amoxicillin Anaphylaxis and Rash    REACTION:Throat Swelling  . Aspirin Anaphylaxis, Itching and Rash  . Morphine And Related Anaphylaxis  . Penicillins Anaphylaxis and Rash  . Ace Inhibitors Cough    ACE possibly associated with cough and switched to ARB.  Would be OK to re-challenge if needed.  . Gabapentin     Patient had one time seizure shortly after stopping gabapentin    Past Medical History  Diagnosis Date  . Crohn's disease   . Hypertension   . Hyperlipidemia   . CHF (congestive heart failure)     EF 30-35% 2012->EF 60-65% 2013  . GERD (gastroesophageal reflux disease)   . 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 Anxiety   . Panic attacks   .  CKD (chronic kidney disease), stage III   . Anemia, chronic renal failure   . Asthma 05/2011  . Headache     "related to high BP" (05/30/2012)  . Tobacco abuse   . Abnormal chest CT     Coronary atherosclerosis on chest CT 2012    Past Surgical History  Procedure Laterality Date  . 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 Ectopic pregnancy surgery  ?1980's    left  . Cholecystectomy  01/28/2005  . Cataract extraction w/ intraocular lens  implant, bilateral  ~ 2000    History  Smoking status  . Current Every Day Smoker -- 0.12 packs/day for 18 years  . Types: Cigarettes  Smokeless tobacco  . Never Used    History  Alcohol Use  . Yes    Comment: 05/30/2012 "used to drink back in the day; last alcohol 23 yr ago"    Family History   Problem Relation Age of Onset  . Heart disease Mother   . Stroke Father     Reviw of Systems:  Reviewed in the HPI.  All other systems are negative.  Physical Exam: BP 132/74  Pulse 72  Ht 5' 6"  (1.676 m)  Wt 144 lb 12.8 oz (65.681 kg)  BMI 23.38 kg/m2  SpO2 99% The patient is alert and oriented x 3.  The mood and affect are normal.   Skin: warm and dry.  Color is normal.    HEENT:   Normocephalic/atraumatic. She has normal carotids. There is no JVD.  Lungs: Lung exam is clear.   Heart: Regular rate S1-S2.    Abdomen: She has colostomy on her left side.  +BS. nontender  Extremities:  She has no clubbing cyanosis or edema.  Neuro:  There exam is nonfocal.    ECG:  Assessment / Plan:

## 2012-10-18 NOTE — Patient Instructions (Signed)
Your physician wants you to follow-up in: [place] will receive a reminder letter in the mail two months in advance. If you don't receive a letter, please call our office to schedule the follow-up appointment.  Your physician recommends that you continue on your current medications as directed. Please refer to the Current Medication list given to you today.

## 2012-10-24 ENCOUNTER — Encounter: Payer: Self-pay | Admitting: *Deleted

## 2012-10-24 ENCOUNTER — Telehealth: Payer: Self-pay | Admitting: *Deleted

## 2012-10-24 NOTE — Telephone Encounter (Signed)
Patient returned call to [place].

## 2012-10-24 NOTE — Telephone Encounter (Signed)
Pt was schedule with colpo clinic for this Thursday. [name] [name], [name] [name]

## 2012-10-24 NOTE — Telephone Encounter (Signed)
Message copied by Corinna Capra on Mon Oct 24, 2012  2:34 PM ------      Message from: Mingo Amber, JEFFREY H      Created: Mon Oct 24, 2012  2:14 PM       Hello [facility] Team,  Can you guys call Ms Mcgahan and set her up for Colposcopy Clinic based on the results of her Pap smear?  Thanks,Jeff ------

## 2012-10-24 NOTE — Progress Notes (Signed)
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  Needs referral to [facility] clinic for colposcopy.  Will forward to [facility] to call and make appointment as well as PCP.

## 2012-10-24 NOTE — Telephone Encounter (Signed)
Left message on voicemail for a return call. Tierney Behl, Lewie Loron

## 2012-10-27 ENCOUNTER — Ambulatory Visit (INDEPENDENT_AMBULATORY_CARE_PROVIDER_SITE_OTHER): Payer: Medicare Other | Admitting: Family Medicine

## 2012-10-27 ENCOUNTER — Encounter: Payer: Self-pay | Admitting: Family Medicine

## 2012-10-27 VITALS — BP 122/67 | HR 72 | Ht 66.0 in | Wt 144.0 lb

## 2012-10-27 DIAGNOSIS — IMO0002 Reserved for concepts with insufficient information to code with codable children: Secondary | ICD-10-CM

## 2012-10-27 DIAGNOSIS — R6889 Other general symptoms and signs: Secondary | ICD-10-CM

## 2012-11-02 ENCOUNTER — Other Ambulatory Visit: Payer: Self-pay | Admitting: Family Medicine

## 2012-11-07 ENCOUNTER — Telehealth: Payer: Self-pay | Admitting: Family Medicine

## 2012-11-07 NOTE — Progress Notes (Signed)
Patient ID: [name] [name], female   DOB: December 13, 1950, 62 y.o.   MRN: [id number] Patient given informed consent, signed copy in the chart.  Placed in lithotomy position.  Small Graves speculum and then a small Peterson were each used to try and visualize the cervix. The patient was quite anxious and on the second try when I placed some pressure against the posterior vaginal wall, she asked to discontinue the procedure which we did. I think she likely has some scarring from her previous rectal surgery which makes this a little more  uncomfortable procedure.   PLAN: I discussed at length with her and with her PCP, Dr. Andria Frames and we decided to repeat a co test pap in 6 months to be performed by Dr. Andria Frames. He plans on calling teh patient today as well as she was quite upset.

## 2012-11-08 NOTE — Telephone Encounter (Signed)
Opened in error

## 2012-12-10 ENCOUNTER — Other Ambulatory Visit: Payer: Self-pay | Admitting: Family Medicine

## 2012-12-16 ENCOUNTER — Ambulatory Visit (INDEPENDENT_AMBULATORY_CARE_PROVIDER_SITE_OTHER): Payer: Medicare Other | Admitting: Family Medicine

## 2012-12-16 ENCOUNTER — Encounter: Payer: Self-pay | Admitting: Family Medicine

## 2012-12-16 VITALS — BP 112/65 | HR 96 | Temp 98.8°F | Ht 66.0 in | Wt 144.0 lb

## 2012-12-16 DIAGNOSIS — Z720 Tobacco use: Secondary | ICD-10-CM

## 2012-12-16 DIAGNOSIS — F419 Anxiety disorder, unspecified: Secondary | ICD-10-CM

## 2012-12-16 DIAGNOSIS — I10 Essential (primary) hypertension: Secondary | ICD-10-CM

## 2012-12-16 DIAGNOSIS — F411 Generalized anxiety disorder: Secondary | ICD-10-CM

## 2012-12-16 DIAGNOSIS — K5090 Crohn's disease, unspecified, without complications: Secondary | ICD-10-CM

## 2012-12-16 DIAGNOSIS — F17200 Nicotine dependence, unspecified, uncomplicated: Secondary | ICD-10-CM

## 2012-12-16 MED ORDER — MESALAMINE 800 MG PO TBEC: 1.0000 | DELAYED_RELEASE_TABLET | Freq: Three times a day (TID) | ORAL | Status: DC

## 2012-12-16 MED ORDER — BENZONATATE 100 MG PO CAPS: 100.0000 mg | ORAL_CAPSULE | Freq: Three times a day (TID) | ORAL | Status: DC | PRN

## 2012-12-16 NOTE — Assessment & Plan Note (Signed)
Well controled

## 2012-12-16 NOTE — Assessment & Plan Note (Addendum)
Refill mesalamine. Well controled by symptoms.

## 2012-12-16 NOTE — Progress Notes (Signed)
  Subjective:    Patient ID: [name] [name], female    DOB: 07-11-1950, 62 y.o.   MRN: [id number]  HPI [name] [name] is feeling quite well.  She is happy and not depressed.  Of course, anxiety remains an ongoing issue.  She needs refills on two meds.  Apparently the refill requests went to other docs.  She needs her mesalamine for her ongoing Crohn's - which seems well controled. She has a chronic cough - likely due to tobacco use.  She states she plans to quit smoking after she moves to a new place later this month (that apartment will be smoke free)  We discussed and she plans to quit cold [place].   Review of Systems     Objective:   Physical Exam Lungs clear Affect good.        Assessment & Plan:

## 2012-12-16 NOTE — Assessment & Plan Note (Signed)
Has a quit date in mind.  Plans cold [place].

## 2012-12-16 NOTE — Patient Instructions (Addendum)
Remember your promise to stop smoking.   You look good. Remember that you need a pap smear by me in Oct.  You will also need blood work then. I sent in the two prescriptions.  Let me know if you have any trouble.

## 2012-12-16 NOTE — Assessment & Plan Note (Signed)
A chronic issue for her.  Stable on meds.

## 2012-12-22 ENCOUNTER — Telehealth: Payer: Self-pay | Admitting: Family Medicine

## 2012-12-22 NOTE — Telephone Encounter (Signed)
Patient needs to speak to the nurse or Dr. Andria Frames about getting her ostomy supplies through [facility].  Dr. [name] used to order them, but he is now retired, so she needs another doctor to order them now.

## 2012-12-22 NOTE — Telephone Encounter (Signed)
Dr. Cheral Bay office may still order patient's ostomy supplies, Dr. Amedeo Plenty is taking over his patients.and may prescribe patient's supplies.  Tieasha Larsen, Loralyn Fresh[place], [place]

## 2013-02-08 ENCOUNTER — Ambulatory Visit (INDEPENDENT_AMBULATORY_CARE_PROVIDER_SITE_OTHER): Payer: Medicare Other | Admitting: Family Medicine

## 2013-02-08 ENCOUNTER — Encounter: Payer: Self-pay | Admitting: Family Medicine

## 2013-02-08 VITALS — BP 83/55 | HR 108 | Temp 98.3°F | Wt 135.0 lb

## 2013-02-08 DIAGNOSIS — I959 Hypotension, unspecified: Secondary | ICD-10-CM

## 2013-02-08 DIAGNOSIS — J069 Acute upper respiratory infection, unspecified: Secondary | ICD-10-CM

## 2013-02-08 NOTE — Progress Notes (Signed)
  Subjective:    Patient ID: [name] [name], female    DOB: October 23, 1950, 62 y.o.   MRN: [id number]  HPI  SINUSITIS  Onset: one week ago.  Worse with: nothing Better with: nothing  Symptoms Cough: yes, productive of yellow sputum Runny nose: yes, blowing out yellow snot Fever: not checking at home, none in the office, complains of intermittent chills   Highest Temp: n/a Sinus Pressure: yes, maxillary sinuses  Ears Blocked: yes  Teeth Ache: yes  Frontal Headache: yes    Has tried tylenol for pain. States can't afford medications at this time due to financial constraints. Has a history of smoking, 1 pack will last 3-4 days. Noted COPD listed in chart, though patient did not know she carried this diagnosis.  Notably BP of 83/55 and 82/52 on recheck. Previous BPs have been in the 110-120/60-70 range. Is complaining of being light headed. Is on imdur for CAD and metoprolol for CHF. Patient states has imdur at home and believes she has metoprolol at home though states this may be one of the medications waiting at the pharmacy.  Review of Systems see HPI     Objective:   Physical Exam  Constitutional: She appears well-developed and well-nourished.  HENT:  Head: Normocephalic and atraumatic.  Mild oropharyngeal erythema, no exudate, bilateral TM normal  Eyes: Conjunctivae are normal. Pupils are equal, round, and reactive to light. Right eye exhibits no discharge. Left eye exhibits no discharge.  Neck: Neck supple.  Cardiovascular: Normal rate, regular rhythm and normal heart sounds.  Exam reveals no gallop and no friction rub.   No murmur heard. Pulmonary/Chest: Effort normal and breath sounds normal. No respiratory distress. She has no wheezes. She has no rales.  Lymphadenopathy:    She has no cervical adenopathy.  Skin: Skin is warm and dry.  BP 83/55  Pulse 108  Temp(Src) 98.3 F (36.8 C) (Oral)  Wt 135 lb (61.236 kg)  BMI 21.8 kg/m2     Assessment & Plan:

## 2013-02-08 NOTE — Assessment & Plan Note (Signed)
Blood pressure to the 80's/50's today. Potentially related to decreased fluid intake during acute illness vs over treatment with antihypertensives. Will hold imdur today. Advised to continue metoprolol given CHF and tachycardia. To f/u in the next 1-2 days to ensure BP improved. Precepted with Dr. Gwendlyn Deutscher.

## 2013-02-08 NOTE — Patient Instructions (Addendum)
Nice to meet you today. Sorry you are feeling so poorly. Your symptoms are likely due to a viral illness. You can try over the counter medications tylenol, pseudoephedrine, and mucinex for symptoms relief. You can also try nasal irrigation with the instructions outlined in the hand out. If your symptoms are not better by the end of the week please come in for an appointment. You need to be sure to drink plenty of fluids. We are also going to hold your imdur for your low blood pressure.  I want you to come in for a follow-up with Dr Andria Frames in the next couple of days to check on your blood pressure and medications.

## 2013-02-08 NOTE — Assessment & Plan Note (Addendum)
Given symptoms and length of symptoms patient likely has viral URI. O2 sats are normal and normal lung exam with no fever making COPD exacerbation less likely. Advised supportive care and psuedoephedrine, mucinex, and tylenol as she can afford for symptom relief. Given hand out on saline irrigation for congestion.

## 2013-02-09 ENCOUNTER — Ambulatory Visit (INDEPENDENT_AMBULATORY_CARE_PROVIDER_SITE_OTHER): Payer: Medicare Other | Admitting: Family Medicine

## 2013-02-09 ENCOUNTER — Encounter (HOSPITAL_COMMUNITY): Payer: Self-pay | Admitting: General Practice

## 2013-02-09 ENCOUNTER — Inpatient Hospital Stay (HOSPITAL_COMMUNITY)
Admission: AD | Admit: 2013-02-09 | Discharge: 2013-02-12 | DRG: 683 | Disposition: A | Discharge status: Home / Self Care | Payer: Medicare Other | Source: Ambulatory Visit | Attending: Family Medicine | Admitting: Family Medicine

## 2013-02-09 ENCOUNTER — Encounter: Payer: Self-pay | Admitting: Family Medicine

## 2013-02-09 VITALS — BP 78/56 | HR 83 | Temp 98.4°F | Ht 66.0 in | Wt 134.0 lb

## 2013-02-09 DIAGNOSIS — N179 Acute kidney failure, unspecified: Principal | ICD-10-CM | POA: Diagnosis present

## 2013-02-09 DIAGNOSIS — I129 Hypertensive chronic kidney disease with stage 1 through stage 4 chronic kidney disease, or unspecified chronic kidney disease: Secondary | ICD-10-CM | POA: Diagnosis present

## 2013-02-09 DIAGNOSIS — N184 Chronic kidney disease, stage 4 (severe): Secondary | ICD-10-CM | POA: Diagnosis present

## 2013-02-09 DIAGNOSIS — I5022 Chronic systolic (congestive) heart failure: Secondary | ICD-10-CM | POA: Diagnosis present

## 2013-02-09 DIAGNOSIS — F17200 Nicotine dependence, unspecified, uncomplicated: Secondary | ICD-10-CM | POA: Diagnosis present

## 2013-02-09 DIAGNOSIS — Z933 Colostomy status: Secondary | ICD-10-CM

## 2013-02-09 DIAGNOSIS — I2510 Atherosclerotic heart disease of native coronary artery without angina pectoris: Secondary | ICD-10-CM | POA: Diagnosis present

## 2013-02-09 DIAGNOSIS — E871 Hypo-osmolality and hyponatremia: Secondary | ICD-10-CM | POA: Diagnosis present

## 2013-02-09 DIAGNOSIS — E785 Hyperlipidemia, unspecified: Secondary | ICD-10-CM | POA: Diagnosis present

## 2013-02-09 DIAGNOSIS — E860 Dehydration: Secondary | ICD-10-CM

## 2013-02-09 DIAGNOSIS — F410 Panic disorder [episodic paroxysmal anxiety] without agoraphobia: Secondary | ICD-10-CM | POA: Diagnosis present

## 2013-02-09 DIAGNOSIS — N183 Chronic kidney disease, stage 3 unspecified: Secondary | ICD-10-CM

## 2013-02-09 DIAGNOSIS — Z9049 Acquired absence of other specified parts of digestive tract: Secondary | ICD-10-CM

## 2013-02-09 DIAGNOSIS — I509 Heart failure, unspecified: Secondary | ICD-10-CM | POA: Diagnosis present

## 2013-02-09 DIAGNOSIS — I959 Hypotension, unspecified: Secondary | ICD-10-CM

## 2013-02-09 DIAGNOSIS — I701 Atherosclerosis of renal artery: Secondary | ICD-10-CM | POA: Diagnosis present

## 2013-02-09 DIAGNOSIS — K2100 Gastro-esophageal reflux disease with esophagitis, without bleeding: Secondary | ICD-10-CM | POA: Diagnosis present

## 2013-02-09 DIAGNOSIS — A088 Other specified intestinal infections: Secondary | ICD-10-CM | POA: Diagnosis present

## 2013-02-09 DIAGNOSIS — J069 Acute upper respiratory infection, unspecified: Secondary | ICD-10-CM

## 2013-02-09 DIAGNOSIS — F411 Generalized anxiety disorder: Secondary | ICD-10-CM | POA: Diagnosis present

## 2013-02-09 DIAGNOSIS — E872 Acidosis, unspecified: Secondary | ICD-10-CM | POA: Diagnosis present

## 2013-02-09 DIAGNOSIS — J449 Chronic obstructive pulmonary disease, unspecified: Secondary | ICD-10-CM | POA: Diagnosis present

## 2013-02-09 DIAGNOSIS — J4489 Other specified chronic obstructive pulmonary disease: Secondary | ICD-10-CM | POA: Diagnosis present

## 2013-02-09 DIAGNOSIS — K5090 Crohn's disease, unspecified, without complications: Secondary | ICD-10-CM

## 2013-02-09 DIAGNOSIS — D631 Anemia in chronic kidney disease: Secondary | ICD-10-CM

## 2013-02-09 DIAGNOSIS — N039 Chronic nephritic syndrome with unspecified morphologic changes: Secondary | ICD-10-CM | POA: Diagnosis present

## 2013-02-09 LAB — CBC WITH DIFFERENTIAL/PLATELET
Basophils Absolute: 0 10*3/uL (ref 0.0–0.1)
Basophils Relative: 0 % (ref 0–1)
HCT: 34.4 % — ABNORMAL LOW (ref 36.0–46.0)
Lymphocytes Relative: 45 % (ref 12–46)
MCHC: 34.3 g/dL (ref 30.0–36.0)
Monocytes Absolute: 0.5 10*3/uL (ref 0.1–1.0)
Neutro Abs: 2.4 10*3/uL (ref 1.7–7.7)
Neutrophils Relative %: 43 % (ref 43–77)
Platelets: 172 10*3/uL (ref 150–400)
RDW: 14 % (ref 11.5–15.5)
WBC: 5.5 10*3/uL (ref 4.0–10.5)

## 2013-02-09 LAB — COMPREHENSIVE METABOLIC PANEL
ALT: 21 U/L (ref 0–35)
Alkaline Phosphatase: 128 U/L — ABNORMAL HIGH (ref 39–117)
BUN: 53 mg/dL — ABNORMAL HIGH (ref 6–23)
CO2: 11 mEq/L — ABNORMAL LOW (ref 19–32)
GFR calc Af Amer: 19 mL/min — ABNORMAL LOW (ref >90)
GFR calc non Af Amer: 16 mL/min — ABNORMAL LOW (ref >90)
Glucose, Bld: 86 mg/dL (ref 70–99)
Potassium: 4.3 mEq/L (ref 3.5–5.1)
Sodium: 130 mEq/L — ABNORMAL LOW (ref 135–145)
Total Protein: 8.3 g/dL (ref 6.0–8.3)

## 2013-02-09 MED ORDER — PANTOPRAZOLE SODIUM 40 MG PO TBEC
40.0000 mg | DELAYED_RELEASE_TABLET | Freq: Every day | ORAL | Status: DC
  Administered 2013-02-10 – 2013-02-12 (×3): 40 mg via ORAL
  Filled 2013-02-09 (×3): qty 1

## 2013-02-09 MED ORDER — CLOPIDOGREL BISULFATE 75 MG PO TABS
75.0000 mg | ORAL_TABLET | Freq: Every day | ORAL | Status: DC
  Administered 2013-02-10 – 2013-02-12 (×3): 75 mg via ORAL
  Filled 2013-02-09 (×5): qty 1

## 2013-02-09 MED ORDER — HEPARIN SODIUM (PORCINE) 5000 UNIT/ML IJ SOLN
5000.0000 [IU] | Freq: Three times a day (TID) | INTRAMUSCULAR | Status: DC
  Administered 2013-02-09 – 2013-02-12 (×8): 5000 [IU] via SUBCUTANEOUS
  Filled 2013-02-09 (×12): qty 1

## 2013-02-09 MED ORDER — LORAZEPAM 0.5 MG PO TABS
0.5000 mg | ORAL_TABLET | Freq: Two times a day (BID) | ORAL | Status: DC
  Administered 2013-02-09 – 2013-02-11 (×5): 0.5 mg via ORAL
  Filled 2013-02-09 (×5): qty 1

## 2013-02-09 MED ORDER — BENZONATATE 100 MG PO CAPS
100.0000 mg | ORAL_CAPSULE | Freq: Three times a day (TID) | ORAL | Status: DC | PRN
  Filled 2013-02-09: qty 1

## 2013-02-09 MED ORDER — ACETAMINOPHEN 650 MG RE SUPP: 650.0000 mg | Freq: Four times a day (QID) | RECTAL | Status: DC | PRN

## 2013-02-09 MED ORDER — ACETAMINOPHEN 325 MG PO TABS: 650.0000 mg | ORAL_TABLET | Freq: Four times a day (QID) | ORAL | Status: DC | PRN

## 2013-02-09 MED ORDER — MESALAMINE 800 MG PO TBEC: 1.0000 | DELAYED_RELEASE_TABLET | Freq: Three times a day (TID) | ORAL | Status: DC

## 2013-02-09 MED ORDER — SODIUM CHLORIDE 0.9 % IJ SOLN
3.0000 mL | Freq: Two times a day (BID) | INTRAMUSCULAR | Status: DC
  Administered 2013-02-09 – 2013-02-11 (×4): 3 mL via INTRAVENOUS

## 2013-02-09 MED ORDER — TRIAMCINOLONE ACETONIDE 0.1 % EX OINT
TOPICAL_OINTMENT | Freq: Two times a day (BID) | CUTANEOUS | Status: DC
  Administered 2013-02-09 – 2013-02-11 (×5): via TOPICAL
  Filled 2013-02-09 (×4): qty 15

## 2013-02-09 MED ORDER — CITALOPRAM HYDROBROMIDE 20 MG PO TABS
20.0000 mg | ORAL_TABLET | Freq: Every day | ORAL | Status: DC
  Administered 2013-02-10 – 2013-02-11 (×2): 20 mg via ORAL
  Filled 2013-02-09 (×4): qty 1

## 2013-02-09 MED ORDER — SIMVASTATIN 20 MG PO TABS
20.0000 mg | ORAL_TABLET | Freq: Every day | ORAL | Status: DC
  Administered 2013-02-09 – 2013-02-11 (×3): 20 mg via ORAL
  Filled 2013-02-09 (×5): qty 1

## 2013-02-09 MED ORDER — MESALAMINE 400 MG PO CPDR
800.0000 mg | DELAYED_RELEASE_CAPSULE | Freq: Three times a day (TID) | ORAL | Status: DC
  Administered 2013-02-09 – 2013-02-11 (×8): 800 mg via ORAL
  Filled 2013-02-09 (×12): qty 2

## 2013-02-09 MED ORDER — ONDANSETRON HCL 4 MG PO TABS: 4.0000 mg | ORAL_TABLET | Freq: Four times a day (QID) | ORAL | Status: DC | PRN

## 2013-02-09 MED ORDER — SODIUM CHLORIDE 0.9 % IV BOLUS (SEPSIS)
250.0000 mL | Freq: Once | INTRAVENOUS | Status: AC
  Administered 2013-02-09: 250 mL via INTRAVENOUS

## 2013-02-09 MED ORDER — SODIUM CHLORIDE 0.9 % IV BOLUS (SEPSIS): 1000.0000 mL | Freq: Once | INTRAVENOUS | Status: DC

## 2013-02-09 MED ORDER — SODIUM CHLORIDE 0.9 % IV SOLN: INTRAVENOUS | Status: DC

## 2013-02-09 MED ORDER — ONDANSETRON HCL 4 MG/2ML IJ SOLN: 4.0000 mg | Freq: Four times a day (QID) | INTRAMUSCULAR | Status: DC | PRN

## 2013-02-09 NOTE — Progress Notes (Signed)
S: Patient was seen lying in bed. She reported to having URI symptoms the previous 3 days. She has had to change her bag today 2 times and she usually changes it once every 2-3 days. She didn't report being around anyone that has been sick. She wanted to drink some coffee.   O:  BP 103/61  Pulse 65  Temp(Src) 98.4 F (36.9 C) (Oral)  Resp 20  Ht 5' 6"  (1.676 m)  Wt 134 lb 11.2 oz (61.1 kg)  BMI 21.75 kg/m2  SpO2 100%  Gen: NAD, alert, cooperative with exam HEENT: NCAT, Moist mucus membranes  CV: RRR, good S1/S2, no murmur Resp: CTABL, slight end expiratory wheezes, non-labored Abd: SNTND, Hyperactive bowel sounds, no guarding or organomegaly, patent ostomy with brown liquid.  Ext: No edema, warm Neuro: Alert and oriented, No gross deficits  A/P: [name] [name] is a 62 yo F with a three day history of increased ostomy output with URI symptoms but no emesis.    # Dehydration: Replacing fluid NS 100 mL/hr   #Electrolytes: checking labs tomorrow AM, replacing as needed.   * please see H&P by Dr. Caryl Bis for full plan

## 2013-02-09 NOTE — Progress Notes (Signed)
Report given to receiving RN. Patient is stable with no complaints and no signs or symptoms of distress or discomfort. 

## 2013-02-09 NOTE — Patient Instructions (Signed)
Admitted to the hospital

## 2013-02-09 NOTE — H&P (Signed)
[facility] Admission History and Physical Service Pager: [phone]  Patient name: [name] [name] Medical record number: [id number] Date of birth: February 23, 1951 Age: 62 y.o. Gender: female  Primary Care Provider: Zigmund Gottron, MD Consultants: none Code Status: full  Chief Complaint: hypotension  Assessment and Plan: [name] [name] is a 62 y.o. year old female with a history of crohn's disease, CKD stage 3, COPD, HTN, CAD, and systolic CHF (though last EF was 60-65%, previously was 35%) presenting with hypotension, diarrhea, and URI symptoms.  # Hypotension: patient with low BP last two days in clinic. Given history of increased ostomy output this is likely related to volume loss, though must consider medication and infection leading to SIRS or sepsis as cause. At this time patient just has hypotension, and does not meet any criteria for SIRS at this time. -will admit to tele, Attending Dr. Walker Kehr -will give NS 1 L bolus, then start MIVF -will monitor volume status and BP -will hold home imdur and metoprolol given low BP  # Increased ostomy output: feel this is likely related to crohn's flare vs viral gastro.  -will continue home mesalamine -will hydrate as above -additionally consider steroids and antidiarrheals if felt to be related to crohn's -consider GI c/s if this is felt to be a crohn's flare  # URI: patient with continued congestion, rhinorrhea, and cough. Feel that this is likely viral in origin, though is approaching about a week and a half of duration so could consider a bacterial sinusitis if continues. No abnormalities on pulmonary exam to indicate pulmonary issue. -supportive care-tylenol for discomfort -tessalon for cough  # CAD: per cardiology office visit note this is a possible diagnosis. Had CP in 11/13 and given renal function did not cath. Was started on plavix and imdur at that time. -continue home plavix -hold imdur and metoprolol in  setting of hypotension  # HLD: last LDL 76 -continue home simvastatin  # CKD stage 3: last Cr 1.91 - will continue to monitor is setting of volume repletion   # Anxiety:  -will continue home celexa and ativan  # GERD: -continue protonix daily  FEN/GI: heart healthy diet, fluids as above Prophylaxis: Heparin sq  Disposition: admit to tele, discharge pending improvement in BP and improved PO intake  History of Present Illness: [name] [name] is a 62 y.o. year old female with a history of crohn's disease, CKD stage 3, COPD, HTN, CAD, and systolic CHF (though last EF was 60-65%) presenting with hypotension, diarrhea, and URI symptoms.  Patient presented to clinic this afternoon in follow-up for low blood pressure yesterday. Her BP yesterday was 83/55 and 82/52. She was advised to hold her imdur last night to see if this was having an effect on her BP. She returned to clinic today not feeling any better from her URI. Her BP remained low today in clinic and the patient noted that she has had increased output through her ostomy. Yesterday changed the bag 3 times when she usually changes it every 5 days. Has changed it 2x today. Notes increased watery brown stool. Does not note any sick contacts recently. Notes some chills at home but no confirmed fevers.  Continues to have congestion, rhinorrhea, post nasal drip, and cough.  Review Of Systems: Per HPI with the following additions: none Otherwise 12 point review of systems was performed and was unremarkable.  Patient Active Problem List   Diagnosis Date Noted  . URI (upper respiratory infection) 02/08/2013  . Low BP  02/08/2013  . CAD (coronary artery disease) 05/30/2012  . Tobacco abuse 05/30/2012  . COPD (chronic obstructive pulmonary disease) 05/30/2012  . Anemia in chronic kidney disease 05/30/2012  . Vitamin d deficiency 12/02/2011  . Osteopenia 10/05/2011  . Abnormal Pap smear 09/21/2011  . Left renal artery stenosis 09/04/2011  .  Chronic kidney disease (CKD), stage III (moderate) 07/30/2011  . Systolic CHF, chronic 14/78/2956  . Seizure 03/13/2011  . Hypertension, benign 09/25/2010  . HYPERCHOLESTEROLEMIA 01/10/2007  . Anxiety 09/02/2006  . NEUROPATHY, PERIPHERAL 09/02/2006  . Reflux esophagitis 09/02/2006  . CROHN'S DISEASE 09/02/2006   Past Medical History: Past Medical History  Diagnosis Date  . Crohn's disease   . Hypertension   . Hyperlipidemia   . CHF (congestive heart failure)     EF 30-35% 2012->EF 60-65% 2013  . GERD (gastroesophageal reflux disease)   . 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 Anxiety   . Panic attacks   . CKD (chronic kidney disease), stage III   . Anemia, chronic renal failure   . Asthma 05/2011  . Headache(784.0)     "related to high BP" (05/30/2012)  . Tobacco abuse   . Abnormal chest CT     Coronary atherosclerosis on chest CT 2012   Past Surgical History: Past Surgical History  Procedure Laterality Date  . 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 Ectopic pregnancy surgery  ?1980's    left  . Cholecystectomy  01/28/2005  . Cataract extraction w/ intraocular lens  implant, bilateral  ~ 2000   Social History: History  Substance Use Topics  . Smoking status: Current Every Day Smoker -- 0.12 packs/day for 18 years    Types: Cigarettes  . Smokeless tobacco: Never Used  . Alcohol Use: Yes     Comment: 05/30/2012 "used to drink back in the day; last alcohol 23 yr ago"   Additional social history: none  Please also refer to relevant sections of EMR.  Family History: Family History  Problem Relation Age of Onset  . Heart disease Mother   . Stroke Father    Allergies and Medications: Allergies  Allergen Reactions  . Amoxicillin Anaphylaxis and Rash    REACTION:Throat Swelling  . Aspirin Anaphylaxis, Itching and Rash  . Morphine And Related Anaphylaxis  . Penicillins Anaphylaxis and Rash  . Ace Inhibitors Cough    ACE possibly associated with cough and switched to  ARB.  Would be OK to re-challenge if needed.  . Gabapentin     Patient had one time seizure shortly after stopping gabapentin   No current facility-administered medications on file prior to encounter.   Current Outpatient Prescriptions on File Prior to Encounter  Medication Sig Dispense Refill  . benzonatate (TESSALON) 100 MG capsule Take 1 capsule (100 mg total) by mouth 3 (three) times daily as needed for cough.  90 capsule  1  . citalopram (CELEXA) 20 MG tablet take 1 tablet by mouth once daily  90 tablet  3  . clopidogrel (PLAVIX) 75 MG tablet Take 1 tablet (75 mg total) by mouth daily with breakfast.  30 tablet  6  . isosorbide mononitrate (IMDUR) 30 MG 24 hr tablet take 1 tablet by mouth at bedtime  90 tablet  3  . LORazepam (ATIVAN) 0.5 MG tablet Take 0.5 mg by mouth 2 (two) times daily.      . Mesalamine 800 MG TBEC Take 1 tablet (800 mg total) by mouth 3 (three) times daily.  180 tablet  5  . metoprolol succinate (TOPROL-XL) 50 MG 24 hr tablet Take 50 mg by mouth daily.      . Multiple Vitamin (MULITIVITAMIN WITH MINERALS) TABS Take 1 tablet by mouth daily.      . pantoprazole (PROTONIX) 40 MG tablet Take 1 tablet (40 mg total) by mouth daily at 6 (six) AM.  30 tablet  6  . simvastatin (ZOCOR) 20 MG tablet Take 1 tablet (20 mg total) by mouth at bedtime.  90 tablet  3    Objective: BP 103/61  Pulse 65  Temp(Src) 98.4 F (36.9 C) (Oral)  Resp 20  Ht 5' 6"  (3.235 m)  Wt 573 lb 11.2 oz (61.1 kg)  BMI 21.75 kg/m2  SpO2 100% Exam: General: NAD, tired appearing HEENT: NCAT, MMM, no OP erythema Cardiovascular: rrr, no mrg Respiratory: CTAB, no wheezes or crackles Abdomen: soft, NT, ND, ostomy bag in place, ostomy pink and appears to be healthy tissue, brown liquid in bag Extremities: no edema Skin: no lesions noted Neuro: alert, no focal deficits  Labs and Imaging: None  Leone Haven, MD 02/09/2013, 4:31 PM PGY-2, [place] Intern pager:  [phone], text pages welcome

## 2013-02-09 NOTE — Progress Notes (Signed)
Patient ID: [name] [name], female   DOB: January 18, 1951, 62 y.o.   MRN: [id number] [facility] Admission History and Physical  Service Pager: [phone]   Patient name: [name] [name] Medical record number: [id number]  Date of birth: [birth year] Age: 62 y.o. Gender: female  Primary Care Provider: Zigmund Gottron, MD  Consultants: none Code Status: full   Chief Complaint: hypotension   Assessment and Plan:  [name] [name] is a 62 y.o. year old female with a history of crohn's disease, CKD stage 3, COPD, HTN, CAD, and systolic CHF (though last EF was 60-65%, previously was 35%) presenting with hypotension, diarrhea, and URI symptoms.   # Hypotension: patient with low BP last two days in clinic. Given history of increased ostomy output this is likely related to volume loss, though must consider medication and infection leading to SIRS or sepsis as cause. At this time patient just has hypotension, and does not meet any criteria for SIRS at this time.  -will admit to tele, Attending Dr. Walker [name]  -will give NS 1 L bolus, then start MIVF  -will monitor volume status and BP  -will hold home imdur and metoprolol given low BP   # Increased ostomy output: feel this is likely related to crohn's flare vs viral gastro.  -will continue home mesalamine  -will hydrate as above  -additionally consider steroids and antidiarrheals if felt to be related to crohn's  -consider GI c/s if this is felt to be a crohn's flare   # URI: patient with continued congestion, rhinorrhea, and cough. Feel that this is likely viral in origin, though is approaching about a week and a half of duration so could consider a bacterial sinusitis if continues. No abnormalities on pulmonary exam to indicate pulmonary issue.  -supportive care-tylenol for discomfort  -tessalon for cough   # CAD: per cardiology office visit note this is a possible diagnosis. Had CP in 11/13 and given renal function did not cath. Was  started on plavix and imdur at that time.  -continue home plavix  -hold imdur and metoprolol in setting of hypotension   # HLD: last LDL 76  -continue home simvastatin   # CKD stage 3: last Cr 1.91  - will continue to monitor is setting of volume repletion   # Anxiety:  -will continue home celexa and ativan   # GERD:  -continue protonix daily   FEN/GI: heart healthy diet, fluids as above  Prophylaxis: Heparin sq   Disposition: admit to tele, discharge pending improvement in BP and improved PO intake   History of Present Illness: [name] [name] is a 62 y.o. year old female with a history of crohn's disease, CKD stage 3, COPD, HTN, CAD, and systolic CHF (though last EF was 60-65%) presenting with hypotension, diarrhea, and URI symptoms.   Patient presented to clinic this afternoon in follow-up for low blood pressure yesterday. Her BP yesterday was 83/55 and 82/52. She was advised to hold her imdur last night to see if this was having an effect on her BP. She returned to clinic today not feeling any better from her URI. Her BP remained low today in clinic and the patient noted that she has had increased output through her ostomy. Yesterday changed the bag 3 times when she usually changes it every 5 days. Has changed it 2x today. Notes increased watery brown stool. Does not note any sick contacts recently. Notes some chills at home but no confirmed fevers.   Continues to have  congestion, rhinorrhea, post nasal drip, and cough.   Review Of Systems: Per HPI with the following additions: none  Otherwise 12 point review of systems was performed and was unremarkable.   Patient Active Problem List    Diagnosis  Date Noted   .  URI (upper respiratory infection)  02/08/2013   .  Low BP  02/08/2013   .  CAD (coronary artery disease)  05/30/2012   .  Tobacco abuse  05/30/2012   .  COPD (chronic obstructive pulmonary disease)  05/30/2012   .  Anemia in chronic kidney disease  05/30/2012   .   Vitamin d deficiency  12/02/2011   .  Osteopenia  10/05/2011   .  Abnormal Pap smear  09/21/2011   .  Left renal artery stenosis  09/04/2011   .  Chronic kidney disease (CKD), stage III (moderate)  07/30/2011   .  Systolic CHF, chronic  88/89/1694   .  Seizure  03/13/2011   .  Hypertension, benign  09/25/2010   .  HYPERCHOLESTEROLEMIA  01/10/2007   .  Anxiety  09/02/2006   .  NEUROPATHY, PERIPHERAL  09/02/2006   .  Reflux esophagitis  09/02/2006   .  CROHN'S DISEASE  09/02/2006    Past Medical History:  Past Medical History   Diagnosis  Date   .  Crohn's disease    .  Hypertension    .  Hyperlipidemia    .  CHF (congestive heart failure)      EF 30-35% 2012->EF 60-65% 2013   .  GERD (gastroesophageal reflux disease)    .  History of viral myocarditis  1990s   .  Anxiety    .  Panic attacks    .  CKD (chronic kidney disease), stage III    .  Anemia, chronic renal failure    .  Asthma  05/2011   .  Headache(784.0)      "related to high BP" (05/30/2012)   .  Tobacco abuse    .  Abnormal chest CT      Coronary atherosclerosis on chest CT 2012    Past Surgical History:  Past Surgical History   Procedure  Laterality  Date   .  Colostomy   03/1996     diverting   .  Ectopic pregnancy surgery   ?1980's     left   .  Cholecystectomy   01/28/2005   .  Cataract extraction w/ intraocular lens implant, bilateral   ~ 2000    Social History:  History   Substance Use Topics   .  Smoking status:  Current Every Day Smoker -- 0.12 packs/day for 18 years     Types:  Cigarettes   .  Smokeless tobacco:  Never Used   .  Alcohol Use:  Yes      Comment: 05/30/2012 "used to drink back in the day; last alcohol 23 yr ago"    Additional social history: none  Please also refer to relevant sections of EMR.   Family History:  Family History   Problem  Relation  Age of Onset   .  Heart disease  Mother    .  Stroke  Father     Allergies and Medications:  Allergies   Allergen  Reactions    .  Amoxicillin  Anaphylaxis and Rash     REACTION:Throat Swelling   .  Aspirin  Anaphylaxis, Itching and Rash   .  Morphine And Related  Anaphylaxis   .  Penicillins  Anaphylaxis and Rash   .  Ace Inhibitors  Cough     ACE possibly associated with cough and switched to ARB. Would be OK to re-challenge if needed.   .  Gabapentin      Patient had one time seizure shortly after stopping gabapentin    No current facility-administered medications on file prior to encounter.    Current Outpatient Prescriptions on File Prior to Encounter   Medication  Sig  Dispense  Refill   .  benzonatate (TESSALON) 100 MG capsule  Take 1 capsule (100 mg total) by mouth 3 (three) times daily as needed for cough.  90 capsule  1   .  citalopram (CELEXA) 20 MG tablet  take 1 tablet by mouth once daily  90 tablet  3   .  clopidogrel (PLAVIX) 75 MG tablet  Take 1 tablet (75 mg total) by mouth daily with breakfast.  30 tablet  6   .  isosorbide mononitrate (IMDUR) 30 MG 24 hr tablet  take 1 tablet by mouth at bedtime  90 tablet  3   .  LORazepam (ATIVAN) 0.5 MG tablet  Take 0.5 mg by mouth 2 (two) times daily.     .  Mesalamine 800 MG TBEC  Take 1 tablet (800 mg total) by mouth 3 (three) times daily.  180 tablet  5   .  metoprolol succinate (TOPROL-XL) 50 MG 24 hr tablet  Take 50 mg by mouth daily.     .  Multiple Vitamin (MULITIVITAMIN WITH MINERALS) TABS  Take 1 tablet by mouth daily.     .  pantoprazole (PROTONIX) 40 MG tablet  Take 1 tablet (40 mg total) by mouth daily at 6 (six) AM.  30 tablet  6   .  simvastatin (ZOCOR) 20 MG tablet  Take 1 tablet (20 mg total) by mouth at bedtime.  90 tablet  3    Objective:  BP 103/61  Pulse 65  Temp(Src) 98.4 F (36.9 C) (Oral)  Resp 20  Ht 5' 6"  (1.676 m)  Wt 134 lb 11.2 oz (61.1 kg)  BMI 21.75 kg/m2  SpO2 100%   Exam:  General: NAD, tired appearing  HEENT: NCAT, MMM, no OP erythema  Cardiovascular: rrr, no mrg  Respiratory: CTAB, no wheezes or crackles   Abdomen: soft, NT, ND, ostomy bag in place, ostomy pink and appears to be healthy tissue, brown liquid in bag  Extremities: no edema  Skin: no lesions noted  Neuro: alert, no focal deficits   Labs and Imaging:  None   Leone Haven, MD  02/09/2013, 4:31 PM  PGY-2, [place] Intern pager: [phone], text pages welcome

## 2013-02-10 DIAGNOSIS — J069 Acute upper respiratory infection, unspecified: Secondary | ICD-10-CM

## 2013-02-10 DIAGNOSIS — E860 Dehydration: Secondary | ICD-10-CM | POA: Diagnosis present

## 2013-02-10 LAB — CBC
HCT: 29.1 % — ABNORMAL LOW (ref 36.0–46.0)
MCV: 89.8 fL (ref 78.0–100.0)
RBC: 3.24 MIL/uL — ABNORMAL LOW (ref 3.87–5.11)
RDW: 13.9 % (ref 11.5–15.5)
WBC: 5.2 10*3/uL (ref 4.0–10.5)

## 2013-02-10 LAB — BASIC METABOLIC PANEL
CO2: 12 mEq/L — ABNORMAL LOW (ref 19–32)
Chloride: 109 mEq/L (ref 96–112)
Creatinine, Ser: 2.54 mg/dL — ABNORMAL HIGH (ref 0.50–1.10)
GFR calc Af Amer: 23 mL/min — ABNORMAL LOW (ref >90)
Potassium: 3.7 mEq/L (ref 3.5–5.1)
Sodium: 134 mEq/L — ABNORMAL LOW (ref 135–145)

## 2013-02-10 MED ORDER — SODIUM CHLORIDE 0.9 % IV BOLUS (SEPSIS)
1000.0000 mL | Freq: Once | INTRAVENOUS | Status: AC
  Administered 2013-02-10: 1000 mL via INTRAVENOUS

## 2013-02-10 MED ORDER — SODIUM CHLORIDE 0.45 % IV SOLN
100.0000 mL/h | INTRAVENOUS | Status: DC
  Administered 2013-02-10: 100 mL/h via INTRAVENOUS
  Filled 2013-02-10: qty 1000

## 2013-02-10 MED ORDER — ENSURE COMPLETE PO LIQD
237.0000 mL | ORAL | Status: DC
  Administered 2013-02-10 – 2013-02-11 (×2): 237 mL via ORAL

## 2013-02-10 MED ORDER — SODIUM CHLORIDE 0.9 % IV SOLN
INTRAVENOUS | Status: DC
  Administered 2013-02-10 – 2013-02-11 (×3): via INTRAVENOUS

## 2013-02-10 MED ORDER — SODIUM CHLORIDE 0.9 % IV SOLN
100.0000 mL/h | INTRAVENOUS | Status: DC
  Filled 2013-02-10: qty 1000

## 2013-02-10 NOTE — H&P (Signed)
Attending Note Patient seen and examined with Dr Caryl Bis on the day of admission, patient persists with hypotension in the office in follow up; she reports marked increase in the output from her ostomy (changing bag 3 times since visit on 8/06, usually changes every 5 days), history of Crohn's disease.  Denies chest pain, does have some respiratory symptoms (cough, nasal congestion).  To admit for dehydration/hypotension and workup for likely infectious cause. Dalbert Mayotte, MD

## 2013-02-10 NOTE — Consult Note (Signed)
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 She can reduce if she lies down.   Stomal assessment/size: she reports 2" when reduced. At the time of my assessment she got up to the toilet to change her pouch and the stoma is prolapsed and quite large but reduced easily with her back in bed. Peristomal assessment: intact Treatment options for stomal/peristomal skin: she uses skin prep to protect her skin  Output liquid yellow-brown Ostomy pouching: 1pc. 2" karaya used today, this pt uses precut at home and a clamp closure. I have shown her the options we have here in the hospital and she is most accustomed to pouch similar to the 2" karaya, for that reason she will use that while here in the hospital.  She is very knowledgeable about her ostomy and the care of her peristomal skin. Even though she has had to change more frequently her peristomal skin is in excellent condition.   I will order her some additional supplies to the bedside for use.  Discussed POC with patient and bedside nurse.  Re consult if needed, will not follow at this time. Thanks  Severus Brodzinski [place], [place] [phone])

## 2013-02-10 NOTE — Progress Notes (Signed)
Family Medicine Teaching Service Daily Progress Note Intern Pager: [phone]  Patient name: [name] [name] Medical record number: [id number] Date of birth: Jan 27, 1951 Age: 62 y.o. Gender: female  Primary Care Provider: Zigmund Gottron, MD Consultants: none Code Status: Full  Pt Overview and Major Events to Date: 8/7 - Na 130 / Cr 3.00 (b/l 1.70 to 1.90) / BUN 53 8/8 - inc Na 134 / dec Cr 2.54 / BUN 49  Assessment and Plan:  [name] [name] is a 62 y.o. year old female presenting with hypotension, diarrhea, and URI symptoms with a history of Crohn's Disease (s/p colostomy 1997), CKD stage 3, COPD, HTN, CAD, and systolic CHF (last EF was 60-65%, previously 30-35%)  # Hypotension:  - Presented to clinic past 2 days with measured BP 80s/50s. Due to hx of inc ostomy output (diarrhea), likely secondary to volume loss. Consider infectious etiology, r/o sepsis. However, SIRS criteria not met, afebrile, regular HR, normal WBC, not tachypnic - (8/7) initially received NS 1L bolus - maintenance IVF NS 163m/hr (changed O/N due to concern for excessive Na repletion) - telemetry monitor - BP remains low 99/59, plan to replace fluid loss more aggressively - will monitor volume status and BP - currently holding Imdur and Metoprolol Succ-XL  # Increased ostomy output (diarrhea) Reported increased changing ostomy bag - (8/6 - 5x, 8/7 - 2x) vs Normal 1x q 5 days  - Consider viral gastroenteritis vs unlikely Crohn's flare - continue home mesalamine, for Crohn's - consider steroids and anti-diarrheals - IVF hydration, replace lost fluid - Ordered 1L NS Bolus - will have a low threshold for additional fluid boluses due to significant depletion [ ]  continue to monitor ostomy output - pt reported volume of stool has slightly reduced    - changed pt to Strict I/Os    - closely monitor weight (currently 134-136, which is about 10lbs lower than clinic wt on 12/16/12 (144))  # Hyponatremia - resolved -  initial Na+ was 130 - continue IVF rehydration - resumed to NS 1016mhr to increase Na+ (8/8) Na 134  # Acute on Chronic Kidney Injury - baseline Creatinine 1.70 to 1.90 - initial Cr 3.00, with BUN 53 - likely pre-renal hypovolemic insult - continue IVF rehydration (8/8) improved Cr to 2.54, with BUN 49 - Reviewed med list to avoid any nephrotoxic drugs  # CKD stage 3: last Cr 1.91  - will continue to monitor is setting of volume repletion  # Metabolic Acidosis, non-anion gap - electrolyte imbalances likely due to loss with diarrhea - 8/8 AG 13 (15), improved with fluid hydration  # CHF - Last ECHO 6/6/4/4034normal systolic fxn, EF 6074-25%moderate mitral regurg (prior EF 30-35%) - continue to monitor fluid status while receiving IVF hydration - cautious not to fluid overload pt, although recent ECHO shows normal systolic fxn  # CAD: - Possible Dx in 05/2012, had CP, but 2/2 CKD opted for medical therapy with Imdur + Plavix, instead of cath - hx normal myoview, May 2012 -continue home plavix  - Continue to hold Imdur, Metoprolol Succ-XL 6060maily - due to persistent hypotension  # URI - patient with continued congestion, rhinorrhea, and cough. Feel that this is likely viral in origin, though is approaching about a week and a half of duration so could consider a bacterial sinusitis if continues. Normal pulmonary exam. - supportive care-tylenol for discomfort  - tessalon for cough  # HLD: last LDL 76  -continue home simvastatin  # Anxiety:  -will continue  home celexa and ativan  # GERD:  -continue protonix daily  FEN/GI: heart healthy diet, fluids as above  Prophylaxis: Heparin sq   Disposition: admit to tele, discharge pending improvement in BP and improved PO intake   Subjective: O/N: no events Today patient reports that her diarrhea is still continuing. Looking at her colostomy bag it appears to be significantly watery. She says that the amount has slightly  reduced from previous days. Denies any irritation or pain around the ostomy site. States that her weakness has improved. Her appetite is good and has tried to improve her PO intake.  Admits to cough. Denies any CP, SoB, worsening weakness, palpitations, visual disturbances, blood in stool, nausea / vomiting.  Objective: Temp:  [98.4 F (36.9 C)-98.8 F (37.1 C)] 98.8 F (37.1 C) (08/07 2049) Pulse Rate:  [65-87] 87 (08/08 0528) Resp:  [18-20] 18 (08/08 0528) BP: (78-106)/(56-69) 99/59 mmHg (08/08 0528) SpO2:  [98 %-100 %] 100 % (08/08 0528) Weight:  [134 lb (60.782 kg)-136 lb (61.689 kg)] 136 lb (61.689 kg) (08/08 0528) Physical Exam: General: pleasant, NAD HEENT: NCAT, MMM Cardiovascular: RRR, no murmurs Respiratory: CTAB, no wheezes or crackles  Abdomen: soft, NT, ND, ostomy bag in place with slight amount of leakage noted, yellow-brown liquid in bag  Extremities: no edema, non-tender to palpation Skin: dry, intact Neuro: alert, oriented, grossly non-focal, bilateral UE muscle str 4/5  Laboratory:  Recent Labs Lab 02/09/13 1713 02/10/13 0500  WBC 5.5 5.2  HGB 11.8* 10.0*  HCT 34.4* 29.1*  PLT 172 164    Recent Labs Lab 02/09/13 1713 02/10/13 0500  NA 130* 134*  K 4.3 3.7  CL 104 109  CO2 11* 12*  BUN 53* 49*  CREATININE 3.00* 2.54*  CALCIUM 9.2 7.8*  PROT 8.3  --   BILITOT 0.5  --   ALKPHOS 128*  --   ALT 21  --   AST 26  --   GLUCOSE 86 80   8/7 C.Diff PCR - negative  Imaging/Diagnostic Tests:  None  [name] Putnam, DO 02/10/2013, 11:31 AM PGY-1, [place] Intern pager: [phone], text pages welcome

## 2013-02-10 NOTE — Progress Notes (Signed)
INITIAL NUTRITION ASSESSMENT  DOCUMENTATION CODES Per approved criteria  -Not Applicable   INTERVENTION: Add Ensure Complete po daily, each supplement provides 350 kcal and 13 grams of protein. RD to continue to follow nutrition care plan.  NUTRITION DIAGNOSIS: Inadequate oral intake related to GI distress as evidenced by pt report.  Goal: Intake to meet >90% of estimated nutrition needs.  Monitor:  weight trends, lab trends, I/O's, PO intake, supplement tolerance  Reason for Assessment: Malnutrition Screening Tool  62 y.o. female  Admitting Dx: Dehydration  ASSESSMENT: PMHx significant for Crohn's disease, CKD stage 3, COPD, CHF, ostomy. Admitted with hypotension, increased ostomy output and URI symptoms. Work-up reveals Crohn's flare vs viral gastroenteritis.  Receiving ongoing fluid resuscitation for hypotension. Currently on Heart Healthy diet. Pt states that she is eating fair. Confirms that she has lost 10 lb, has recently gone down from 145 lb to 135 lb, likely 2/2 significant volume loss. She states that prior to the onset of her symptoms 3 days ago, she was eating well. Eats a normal diet. Has tried Ensure in the past and enjoys it. Will add Ensure Complete daily to help meet nutritional needs during poor intake.   Height: Ht Readings from Last 1 Encounters:  02/09/13 5' 6"  (1.676 m)    Weight: Wt Readings from Last 1 Encounters:  02/10/13 136 lb (61.689 kg)    Ideal Body Weight: 130 lb  % Ideal Body Weight: 105%  Wt Readings from Last 10 Encounters:  02/10/13 136 lb (61.689 kg)  02/09/13 134 lb (60.782 kg)  02/08/13 135 lb (61.236 kg)  12/16/12 144 lb (65.318 kg)  10/27/12 144 lb (65.318 kg)  10/18/12 144 lb 12.8 oz (65.681 kg)  10/12/12 145 lb (65.772 kg)  08/04/12 142 lb 1.9 oz (64.465 kg)  08/03/12 141 lb 9.6 oz (64.229 kg)  07/27/12 146 lb (66.225 kg)    Usual Body Weight: 145 lb  % Usual Body Weight: 94%  BMI:  Body mass index is 21.96  kg/(m^2). WNL  Estimated Nutritional Needs: Kcal: 1600 - 1800 Protein: 52 - 62 g Fluid: maintain positive +I/O ostomy balance  Skin:  LUQ colostomy  Diet Order: Cardiac  EDUCATION NEEDS: -No education needs identified at this time   Intake/Output Summary (Last 24 hours) at 02/10/13 1219 Last data filed at 02/10/13 0600  Gross per 24 hour  Intake    360 ml  Output      0 ml  Net    360 ml    Last BM: 8/8 (via ostomy)  Labs:   Recent Labs Lab 02/09/13 1713 02/10/13 0500  NA 130* 134*  K 4.3 3.7  CL 104 109  CO2 11* 12*  BUN 53* 49*  CREATININE 3.00* 2.54*  CALCIUM 9.2 7.8*  GLUCOSE 86 80    CBG (last 3)  No results found for this basename: GLUCAP,  in the last 72 hours  Scheduled Meds: . citalopram  20 mg Oral Daily  . clopidogrel  75 mg Oral Q breakfast  . heparin  5,000 Units Subcutaneous Q8H  . LORazepam  0.5 mg Oral BID  . Mesalamine  800 mg Oral TID  . pantoprazole  40 mg Oral Q0600  . simvastatin  20 mg Oral QHS  . sodium chloride  3 mL Intravenous Q12H  . triamcinolone ointment   Topical BID    Continuous Infusions: . sodium chloride 100 mL/hr at 02/10/13 0901    Past Medical History  Diagnosis Date  . Crohn's  disease   . Hypertension   . Hyperlipidemia   . CHF (congestive heart failure)     EF 30-35% 2012->EF 60-65% 2013  . GERD (gastroesophageal reflux disease)   . 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 Anxiety   . Panic attacks   . CKD (chronic kidney disease), stage III   . Anemia, chronic renal failure   . Asthma 05/2011  . Headache(784.0)     "related to high BP" (05/30/2012)  . Tobacco abuse   . Abnormal chest CT     Coronary atherosclerosis on chest CT 2012    Past Surgical History  Procedure Laterality Date  . 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 Ectopic pregnancy surgery  ?1980's    left  . Cholecystectomy  01/28/2005  . Cataract extraction w/ intraocular lens  implant, bilateral  ~ 2000  . Ileostomy  ?  [address] [place], [place], [place] Pager: [phone] After-hours pager: [phone]

## 2013-02-10 NOTE — Progress Notes (Signed)
Utilization Review Completed Manya Balash J. Shaiann Mcmanamon, RN, BSN, NCM [phone]  

## 2013-02-10 NOTE — Progress Notes (Signed)
I examined this patient and discussed the care plan with Dr Parks Ranger and the [facility] team and agree with assessment and plan as documented in the progress note above.

## 2013-02-11 LAB — BASIC METABOLIC PANEL
BUN: 31 mg/dL — ABNORMAL HIGH (ref 6–23)
Calcium: 7.7 mg/dL — ABNORMAL LOW (ref 8.4–10.5)
GFR calc Af Amer: 35 mL/min — ABNORMAL LOW (ref >90)
GFR calc non Af Amer: 30 mL/min — ABNORMAL LOW (ref >90)
Potassium: 3.9 mEq/L (ref 3.5–5.1)

## 2013-02-11 MED ORDER — SODIUM CHLORIDE 0.9 % IV BOLUS (SEPSIS)
1000.0000 mL | Freq: Once | INTRAVENOUS | Status: AC
  Administered 2013-02-11: 1000 mL via INTRAVENOUS

## 2013-02-11 MED ORDER — SODIUM CHLORIDE 0.9 % IV SOLN
INTRAVENOUS | Status: DC
  Administered 2013-02-11: 18:00:00 via INTRAVENOUS

## 2013-02-11 NOTE — Progress Notes (Signed)
Pt resting on bed comfortable on RA, VSS, denies any pain or discomfort at this time.Colostomy bag continues with moderate liquid stool. We'll continue with POC.

## 2013-02-11 NOTE — Progress Notes (Signed)
Pt is complaining of dizziness when getting OOB to go to bathroom.

## 2013-02-11 NOTE — Progress Notes (Addendum)
Family Medicine Teaching Service Daily Progress Note Intern Pager: [phone]  Patient name: [name] [name] Medical record number: [id number] Date of birth: [birth year] Age: 62 y.o. Gender: female  Primary Care Provider: Zigmund Gottron, MD Consultants: none Code Status: Full  Pt Overview and Major Events to Date: 8/7 - Na 130 / Cr 3.00 (b/l 1.70 to 1.90) / BUN 53 8/8 - inc Na 134 / dec Cr 2.54 / BUN 49 8/9 - Cr 1.79 / BUN 31 / Na 139  Assessment and Plan:  [name] [name] is a 62 y.o. year old female presenting with hypotension, diarrhea, and URI symptoms with a history of Crohn's Disease (s/p colostomy 1997), CKD stage 3, COPD, HTN, CAD, and systolic CHF (last EF was 60-65%, previously 30-35%)  # Hypotension, likely secondary to dehydration  - Presented to clinic past 2 days with measured BP 80s/50s. Due to hx of inc ostomy output (diarrhea), likely secondary to volume loss. Consider infectious etiology, r/o sepsis. However, SIRS criteria not met, afebrile, regular HR, normal WBC, not tachypnic - telemetry monitor - BP improved 100s/60s - continue maintenance IVF - currently holding Imdur and Metoprolol Succ-XL - continue maintenance IVF NS 11m/hr (8/8) NS 1L Bolus [ ]  continue to monitor volume status and BP [ ]  f/u weight 8/9 143 (136 lbs) - recent decreased weight significant indicator of hypovolemic fluid status. Baseline clinic wt of 144 lbs (last 12/16/12), on admission pt weighed 134   - pt back at baseline weight, possible to decrease maintenance IVF, no further bolus at this time [ ]  Orthostatics - due to dizziness on standing  - if positive pt will need more IVF  - if negative, can KVO IVF and likely discharge to home tomorrow (8/10)  # Increased ostomy output (diarrhea) Reported increased changing ostomy bag - (8/6 - 5x, 8/7 - 2x) vs Normal 1x q 5 days  - Likely viral gastroenteritis (reported sick contact grandson has similar diarrheal illness) vs unlikely Crohn's  flare - continue home mesalamine, for Crohn's - consider steroids and anti-diarrheals - C.Diff PCR negative - IVF hydration to replace lost fluid - Ordered 1L NS Bolus - will have a low threshold for additional fluid boluses due to significant depletion [ ]  continue to monitor ostomy output - pt reported volume of stool has slightly reduced    - changed pt to Strict I/Os - +2200 does not account for outs (x5 urine and stool?)    - closely monitor weight (currently 134-136, which is about 10lbs lower than clinic wt on 12/16/12 (144))  # Hyponatremia - resolved - initial Na+ was 130 - continue IVF rehydration - resumed to NS 1054mhr to increase Na+ (8/9) Na 139 (134)  # Acute on Chronic Kidney Injury - baseline Creatinine 1.70 to 1.90 - initial Cr 3.00, with BUN 53 - likely pre-renal etiology 2/2 dehydration with BUN:Cr 18:1, 19:1 - Reviewed med list to avoid any nephrotoxic drugs - continue IVF rehydration (8/9) improved Cr to 1.79 (2.54), with BUN 39 (49)  # CKD stage 3: See above - will continue to monitor is setting of volume repletion  # Metabolic Acidosis, non-anion gap - electrolyte imbalances likely due to loss with diarrhea - 8/9 AG 10 (13), resolved with fluid hydration  # CHF - Last ECHO 6/3/6/1443normal systolic fxn, EF 6015-40%moderate mitral regurg (prior EF 30-35%) - continue to monitor fluid status while receiving IVF hydration - cautious not to fluid overload pt, although recent ECHO shows normal systolic fxn  #  CAD: - Possible Dx in 05/2012, had CP, but 2/2 CKD opted for medical therapy with Imdur + Plavix, instead of cath - hx normal myoview, May 2012 - continue home plavix  - Continue to hold Imdur, Metoprolol Succ-XL 37m daily - due to persistent hypotension  # URI - patient with continued congestion, rhinorrhea, and cough. Feel that this is likely viral in origin, though is approaching about a week and a half of duration so could consider a bacterial  sinusitis if continues. Normal pulmonary exam. - supportive care-tylenol for discomfort  - tessalon for cough  # HLD: last LDL 76  -continue home simvastatin  # Anxiety:  -will continue home celexa and ativan  # GERD:  -continue protonix daily  FEN/GI: heart healthy diet, NS 1068mhr maintenance Prophylaxis: Heparin sq   Disposition: Discharge to home pending improvement in BP and improved hydration status  Subjective: O/N: afebrile, BP stable, (0142, reported Colostomy bag w/ moderate amt liquid stool) Today patient reports that her diarrhea has significantly slowed. Her colostomy bag only has a small amt of liquid stool in it, and she states that this is only the second bag that she has used since admission. She denies any abd pain / cramping, blood in stool. Her ostomy site is not irritated or painful. Overall, she feels a little stronger and has tried to slowly increase her PO intake.  Admits to cough (improved from yesterday) Denies any CP, SoB, worsening weakness, palpitations, visual disturbances, blood in stool, nausea / vomiting.  Objective: Temp:  [98.1 F (36.7 C)-98.5 F (36.9 C)] 98.2 F (36.8 C) (08/09 0600) Pulse Rate:  [75-77] 75 (08/09 0600) Resp:  [18] 18 (08/09 0600) BP: (99-117)/(59-69) 117/59 mmHg (08/09 0600) SpO2:  [99 %-100 %] 99 % (08/09 0600) Weight:  [143 lb 3.2 oz (64.955 kg)] 143 lb 3.2 oz (64.955 kg) (08/09 0600) Physical Exam: General: pleasant, NAD HEENT: NCAT, MMM, hoarse voice Cardiovascular: RRR, no murmurs Respiratory: CTAB, no wheezes or crackles  Abdomen: soft, NT, ND, ostomy bag in place w/o leakage, healthy appearing ostomy tissue, small amt of yellow-brown liquid stool in bag  Extremities: no edema, non-tender to palpation, good peripheral pulses +2 in all ext Skin: dry, intact, several noted eczematous patches on LE Neuro: alert, oriented, grossly non-focal, bilateral UE muscle str 4/5  Laboratory:  Recent Labs Lab  02/09/13 1713 02/10/13 0500  WBC 5.5 5.2  HGB 11.8* 10.0*  HCT 34.4* 29.1*  PLT 172 164    Recent Labs Lab 02/09/13 1713 02/10/13 0500 02/11/13 0655  NA 130* 134* 139  K 4.3 3.7 3.9  CL 104 109 116*  CO2 11* 12* 13*  BUN 53* 49* 31*  CREATININE 3.00* 2.54* 1.79*  CALCIUM 9.2 7.8* 7.7*  PROT 8.3  --   --   BILITOT 0.5  --   --   ALKPHOS 128*  --   --   ALT 21  --   --   AST 26  --   --   GLUCOSE 86 80 97   8/7 C.Diff PCR - negative  Imaging/Diagnostic Tests:  None  AlNobie PutnamDO 02/11/2013, 8:53 AM PGY-1, [facility] pager: [phone] pages welcome   Attending Addendum  I examined the patient and discussed the assessment and plan with Dr. KaNeoma LamingI have reviewed the note and agree.   Patient with increased watery stool and URI symptoms consistent with viral gastroenteritis. She has responded well to IV fluids and at goal  weight. Complaint of dizziness upon standing this AM. Plan to check orthostatic vital signs. KVO IV fluids if orthostatics are negative with plan to d/c later this PM or tomorrow AM. Give additional IV fluids if orthostatic vital signs are positive or patient is symptomatic.  Patient is currently anticipating discharge tomorrow.     Boykin Nearing, [place]

## 2013-02-11 NOTE — Discharge Summary (Signed)
[facility] Discharge Summary  Patient name: [name] [name] Medical record number: [id number] Date of birth: 1951-05-20 Age: 62 y.o. Gender: female Date of Admission: 02/09/2013  Date of Discharge: 02/12/2013 Admitting Physician: Candelaria Celeste, MD  Primary Care Provider: Zigmund Gottron, MD Consultants: none  Indication for Hospitalization: Hypotension and dehydration, secondary to diarrhea from viral gastroenteritis  Discharge Diagnoses/Problem List: Hypotension, secondary to volume loss - resolved Dehydration - resolved Diarrhea, secondary to viral gastroenteritis - resolved Acute on Chronic Kidney Injury - improved Hyponatremia - resolved Upper Respiratory Infection - improved History of Systolic CHF  Disposition: Home  Discharge Condition: Stable  Brief Hospital Course:  [name] [name] is a 62 y.o. year old female presenting with hypotension, diarrhea, and URI symptoms with a history of Crohn's Disease (s/p ileostomy 1997), CKD stage 3, COPD, HTN, CAD, and systolic CHF (last EF was 60-65%, previously 30-35%)  Hypotension, likely secondary to dehydration Prior to hospitalization, the patient presented to the [facility] for 2 days with measured BP 80s/50s, attributed to significant volume loss due to increased ostomy output (diarrhea) for about 1 week. Initial concern was for infectious etiology or flare of Crohn's disease. On presentation, patient was about 10 lbs down from her normal weight, afebrile and vitals were stable except hypotension. She received bolus and maintenance fluids for rehydration therapy. Her home BP meds (Imdur and Metoprolol Succ-XL) were held. Her symptoms of volume loss improved, blood pressure normalized, and her diarrhea decreased. Orthostatic vital signs were tested prior to discharge, and were negative. Her PO intake improved, her weight was back to baseline, and she was ready for discharge.  Diarrhea She had a history of  diarrhea, primarily watery stools, with increased ostomy output for about a week leading up to hospitalization. There were associated upper respiratory symptoms as well, and a noted recent sick family child contact with similar symptoms. During hospitalization, C.Diff PCR was negative. Her symptoms were not accompanied by significant abdominal pain or bloody stools, and she reported that this did not feel similar to previous flares of Crohn's disease. She was continued on her mesalamine for Crohn's. Diarrhea improved by last day of hospitalization.  Acute on Chronic Kidney Injury - Resolved  - baseline Creatinine 1.70 to 1.90  - initial Cr 3.00, with BUN 53 - likely pre-renal etiology 2/2 dehydration with BUN:Cr 18:1, 19:1  - Reviewed med list to avoid any nephrotoxic drugs  - continued IVF rehydration, with noted improvement in Creatinine trend decreased to 1.79 (2.54, 3.00)  CKD Stage 3 - see above  Hyponatremia - resolved - initial Na+ was 130 --> 139  - resolution with IVF rehydration, stable at 139 on discharge  CHF  - Last ECHO 03/11/7590: normal systolic fxn, EF 63-84%, moderate mitral regurg (prior EF 30-35%)  - continued to monitor fluid status while receiving IVF hydration  - cautious not to fluid overload pt, although recent ECHO shows normal systolic fxn - monitored fluid status during admission without concern  CAD:  - Possible Dx in 05/2012, had CP, but 2/2 CKD opted for medical therapy with Imdur + Plavix, instead of cath  - hx normal myoview, May 2012  - continue home plavix  - held home Imdur, Metoprolol Succ-XL due to initial hypotension. Advised patient to restart on discharge.  URI - improved  - patient presented with continued congestion, rhinorrhea, and cough. Likely viral etiology - supportive care-tylenol for discomfort  - tessalon for cough - By time of discharge, patient's URI symptoms had  improved. Likely viral etiology due to sick contact. No further treatment  necessary.  HLD: last LDL 76  -continued home simvastatin  Anxiety:  - continued home Celexa and Ativan  GERD:  -continue protonix daily  Issues for Follow Up:  Diarrhea - Follow-up to see if any repeated episodes of diarrhea with significant fluid loss, if not then was likely due to viral gastroenteritis. If persistent diarrhea, would further investigate if possible Crohn's flare  Rehydration - Follow-up for continued hydration and improved PO intake out of hospital  Kidney Function - Continue to trend and follow creatinine to ensure that it returns to patient's baseline  Significant Procedures: none  Significant Labs and Imaging:   Recent Labs Lab 02/09/13 1713 02/10/13 0500  WBC 5.5 5.2  HGB 11.8* 10.0*  HCT 34.4* 29.1*  PLT 172 164    Recent Labs Lab 02/09/13 1713 02/10/13 0500 02/11/13 0655  NA 130* 134* 139  K 4.3 3.7 3.9  CL 104 109 116*  CO2 11* 12* 13*  GLUCOSE 86 80 97  BUN 53* 49* 31*  CREATININE 3.00* 2.54* 1.79*  CALCIUM 9.2 7.8* 7.7*  ALKPHOS 128*  --   --   AST 26  --   --   ALT 21  --   --   ALBUMIN 4.0  --   --    8/7 C. Diff - negative  Outstanding Results: None  Discharge Medications:    Medication List         citalopram 20 MG tablet  Commonly known as:  CELEXA  take 1 tablet by mouth once daily     clopidogrel 75 MG tablet  Commonly known as:  PLAVIX  Take 1 tablet (75 mg total) by mouth daily with breakfast.     LORazepam 0.5 MG tablet  Commonly known as:  ATIVAN  Take 0.5 mg by mouth 2 (two) times daily.     Mesalamine 800 MG Tbec  Take 1 tablet (800 mg total) by mouth 3 (three) times daily.     metoprolol succinate 50 MG 24 hr tablet  Commonly known as:  TOPROL-XL  Take 50 mg by mouth daily.     simvastatin 20 MG tablet  Commonly known as:  ZOCOR  Take 1 tablet (20 mg total) by mouth at bedtime.        Discharge Instructions: Please refer to Patient Instructions section of EMR for full details.  Patient was  counseled important signs and symptoms that should prompt return to medical care, changes in medications, dietary instructions, activity restrictions, and follow up appointments.   Follow-Up Appointments:   Follow-up Information   Follow up with Zigmund Gottron, MD. Schedule an appointment as soon as possible for a visit in 1 week.   Contact information:   [place] [id number] [place], DO 02/13/2013, 10:32 PM PGY-1, [place]

## 2013-02-11 NOTE — Progress Notes (Signed)
Seen and examined.  Discussed with Dr. Adrian Blackwater and team.  [name] [name] is returning to normal but a bit lightheaded.  Agree with check orthostatics.  She lives alone and we want her safe at DC.  Depending on how she does, DC this afternoon or tomorrow morning.

## 2013-02-12 DIAGNOSIS — K5090 Crohn's disease, unspecified, without complications: Secondary | ICD-10-CM

## 2013-02-12 DIAGNOSIS — Z9889 Other specified postprocedural states: Secondary | ICD-10-CM

## 2013-02-12 DIAGNOSIS — N189 Chronic kidney disease, unspecified: Secondary | ICD-10-CM

## 2013-02-12 DIAGNOSIS — D631 Anemia in chronic kidney disease: Secondary | ICD-10-CM

## 2013-02-12 NOTE — Progress Notes (Signed)
Family Medicine Teaching Service Daily Progress Note Intern Pager: [phone]  Patient name: [name] [name] Medical record number: [id number] Date of birth: [birth year] Age: 62 y.o. Gender: female  Primary Care Provider: Zigmund Gottron, MD Consultants: none Code Status: Full  Pt Overview and Major Events to Date: 8/7 - Na 130 / Cr 3.00 (b/l 1.70 to 1.90) / BUN 53 8/8 - inc Na 134 / dec Cr 2.54 / BUN 49 8/9 - Cr 1.79 / BUN 31 / Na 139 - Orthostatics negative  Assessment and Plan:  [name] [name] is a 62 y.o. year old female presenting with hypotension, diarrhea, and URI symptoms with a history of Crohn's Disease (s/p colostomy 1997), CKD stage 3, COPD, HTN, CAD, and systolic CHF (last EF was 60-65%, previously 30-35%)  # Hypotension, likely secondary to dehydration - Resolved - Presented to clinic past 2 days with measured BP 80s/50s. Due to hx of inc ostomy output (diarrhea), likely secondary to volume loss. Consider infectious etiology, r/o sepsis. However, SIRS criteria not met, afebrile, regular HR, normal WBC, not tachypnic - telemetry monitor - BP improved 100s/60s - continue maintenance IVF - currently holding Imdur and Metoprolol Succ-XL - continue maintenance IVF NS 151m/hr (8/8) NS 1L Bolus [ ]  continue to monitor volume status and BP [ ]  f/u weight 8/10 - 145 (143, 136 lbs) - recent decreased weight significant indicator of hypovolemic fluid status. Baseline clinic wt of 144 lbs (last 12/16/12), on admission pt weighed 134   - pt back at baseline weight, possible to decrease maintenance IVF, no further bolus at this time (8/9) Orthostatics - due to dizziness on standing (received an additional 250 IVF bolus)  - negative w/o significant drop in BP or HR   # Increased ostomy output (diarrhea) - Resolved Reported increased changing ostomy bag - (8/6 - 5x, 8/7 - 2x) vs Normal 1x q 5 days  - Likely viral gastroenteritis (reported sick contact grandson has similar diarrheal  illness) vs unlikely Crohn's flare - continue home mesalamine, for Crohn's - consider steroids and anti-diarrheals - C.Diff PCR negative - IVF hydration to replace lost fluid - Ordered 1L NS Bolus - will have a low threshold for additional fluid boluses due to significant depletion [ ]  continue to monitor ostomy output - pt reported volume of stool has significantly     - closely monitor weight (currently 134-136, which is about 10lbs lower than clinic wt on 12/16/12 (144)) - improved, semi-solid stool in bag  # Hyponatremia - resolved - initial Na+ was 130 --> 139 - completed IVF rehydration    # Acute on Chronic Kidney Injury - Resolved - baseline Creatinine 1.70 to 1.90 - initial Cr 3.00, with BUN 53 - likely pre-renal etiology 2/2 dehydration with BUN:Cr 18:1, 19:1 - Reviewed med list to avoid any nephrotoxic drugs - continue IVF rehydration (8/9) improved Cr to 1.79 (2.54), with BUN 39 (49)  # CKD stage 3: See above - will continue to monitor is setting of volume repletion  # Metabolic Acidosis, non-anion gap - Resolved - electrolyte imbalances likely due to loss with diarrhea - 8/9 AG 10 (13), resolved with fluid hydration  # CHF - Last ECHO 64/09/7355 normal systolic fxn, EF 689-78% moderate mitral regurg (prior EF 30-35%) - continue to monitor fluid status while receiving IVF hydration - cautious not to fluid overload pt, although recent ECHO shows normal systolic fxn  # CAD: - Possible Dx in 05/2012, had CP, but 2/2 CKD opted for medical therapy with Imdur +  Plavix, instead of cath - hx normal myoview, May 2012 - continue home plavix  - Continue to hold Imdur, Metoprolol Succ-XL 50m daily - due to persistent hypotension  # URI - improved - patient with continued congestion, rhinorrhea, and cough. Feel that this is likely viral in origin, though is approaching about a week and a half of duration so could consider a bacterial sinusitis if continues. Normal pulmonary  exam. - supportive care-tylenol for discomfort  - tessalon for cough  # HLD: last LDL 76  -continue home simvastatin  # Anxiety:  -will continue home celexa and ativan  # GERD:  -continue protonix daily  FEN/GI: heart healthy diet, KVO Prophylaxis: Heparin sq   Disposition: Discharge to home pending improvement in BP and improved hydration status  Subjective: O/N: no events, VSS Today patient reports that she feels better. No more diarrhea. She has some more solid stool in her colostomy bag. Denies any abdominal pain or discomfort. Able to stand w/o dizziness. Appetite has improved. She is in good spirits and able to go home today if we can arrange transportation to her car.  Denies any CP, SoB, worsening weakness, palpitations, visual disturbances, blood in stool, nausea / vomiting.  Objective: Temp:  [98.3 F (36.8 C)-99 F (37.2 C)] 98.3 F (36.8 C) (08/10 0643) Pulse Rate:  [65-90] 65 (08/10 0643) Resp:  [18-20] 18 (08/10 0643) BP: (100-131)/(50-66) 125/60 mmHg (08/10 0643) SpO2:  [100 %] 100 % (08/10 0643) Weight:  [145 lb 11.2 oz (66.089 kg)] 145 lb 11.2 oz (66.089 kg) (08/10 09518 Physical Exam: General: pleasant, NAD HEENT: NCAT, MMM, hoarse voice Cardiovascular: RRR, no murmurs Respiratory: CTAB, no wheezes or crackles  Abdomen: soft, NT, ND, ostomy bag with semi-solid stool, healthy appearing ostomy tissue Extremities: no edema, non-tender to palpation, good peripheral pulses +2 in all ext Skin: dry, intact, several noted eczematous patches on LE Neuro: alert, oriented, grossly non-focal, bilateral UE muscle str 5/5  Laboratory:  Recent Labs Lab 02/09/13 1713 02/10/13 0500  WBC 5.5 5.2  HGB 11.8* 10.0*  HCT 34.4* 29.1*  PLT 172 164    Recent Labs Lab 02/09/13 1713 02/10/13 0500 02/11/13 0655  NA 130* 134* 139  K 4.3 3.7 3.9  CL 104 109 116*  CO2 11* 12* 13*  BUN 53* 49* 31*  CREATININE 3.00* 2.54* 1.79*  CALCIUM 9.2 7.8* 7.7*  PROT 8.3  --    --   BILITOT 0.5  --   --   ALKPHOS 128*  --   --   ALT 21  --   --   AST 26  --   --   GLUCOSE 86 80 97   8/7 C.Diff PCR - negative  Imaging/Diagnostic Tests:  None  ANobie Putnam DO 02/12/2013, 9:33 AM PGY-1, [place] pager: [phone] text pages welcome   Attending Addendum  I examined the patient and discussed the assessment and plan with Dr. KNeoma Laming I have reviewed the note and agree. Patient's hypotension and dizziness upon standing has resolved.  She has persistent watery stools that are at baseline given patient has ileostomy. Plan for discharge today with outpatient follow up.     FBoykin Nearing [place]ake [place]

## 2013-02-12 NOTE — Progress Notes (Signed)
Pt resting on bed comfortable, VSS, no c/o pain or discomfort, no distress noticed. We'll continue with POC.

## 2013-02-12 NOTE — Progress Notes (Signed)
Seen and examined.  Feeling much improved.  OK to dc today.

## 2013-02-15 NOTE — Discharge Summary (Signed)
Seen and examined on the day of DC.  Agree with Dr. Marthann Schiller management and documentation.

## 2013-02-22 ENCOUNTER — Ambulatory Visit (INDEPENDENT_AMBULATORY_CARE_PROVIDER_SITE_OTHER): Payer: Medicare Other | Admitting: Family Medicine

## 2013-02-22 ENCOUNTER — Encounter: Payer: Self-pay | Admitting: Family Medicine

## 2013-02-22 VITALS — BP 101/61 | HR 70 | Temp 99.3°F | Ht 66.0 in | Wt 138.4 lb

## 2013-02-22 DIAGNOSIS — I10 Essential (primary) hypertension: Secondary | ICD-10-CM

## 2013-02-22 DIAGNOSIS — E860 Dehydration: Secondary | ICD-10-CM

## 2013-02-22 DIAGNOSIS — N183 Chronic kidney disease, stage 3 unspecified: Secondary | ICD-10-CM

## 2013-02-22 DIAGNOSIS — I959 Hypotension, unspecified: Secondary | ICD-10-CM

## 2013-02-22 MED ORDER — METOPROLOL SUCCINATE ER 50 MG PO TB24: ORAL_TABLET | ORAL | Status: DC

## 2013-02-22 NOTE — Patient Instructions (Addendum)
Stay completely off the isosorbide mononitrate.   Starting tomorrow morning, only take a half metoprolol every day.   You look good.

## 2013-02-23 NOTE — Assessment & Plan Note (Signed)
Still lowish - see HBP

## 2013-02-23 NOTE — Assessment & Plan Note (Signed)
Resolved

## 2013-02-23 NOTE — Assessment & Plan Note (Signed)
Creat back to baseline at DC.

## 2013-02-23 NOTE — Assessment & Plan Note (Signed)
Feel overtreated.  Will half dose of metoprolol.  Stay off Imdur.

## 2013-02-23 NOTE — Progress Notes (Signed)
  Subjective:    Patient ID: [name] [name], female    DOB: March 20, 1951, 62 y.o.   MRN: [id number]  HPI [name] [name] is FU hospitalization.  Had GI illness with decreased PO intake and diarrhea - large increase output from ileostomy.  Was hypotensive on admit.  Was dehydrated and had acute on chronic renal failure.  Her creat was back to normal at DC.  Short duration so likely not a flair of her Crohns, likely viral GE.  No definitive etiology determined.  Feels great.  No complaints.  Imdur was held at DC because of continued low BP.    Review of Systems     Objective:   Physical Exam Still low BP Lungs clear Cardiac RRR without m or g Abd benign.  Patient states normal osteomy output.       Assessment & Plan:

## 2013-03-02 ENCOUNTER — Other Ambulatory Visit: Payer: Self-pay | Admitting: Family Medicine

## 2013-03-02 NOTE — Telephone Encounter (Signed)
Dear [name] [name] Team Please call this in [name]! [name]

## 2013-03-02 NOTE — Telephone Encounter (Signed)
LVM informing that RX has been called in

## 2013-04-06 ENCOUNTER — Other Ambulatory Visit: Payer: Self-pay | Admitting: Family Medicine

## 2013-04-06 DIAGNOSIS — F419 Anxiety disorder, unspecified: Secondary | ICD-10-CM

## 2013-04-07 NOTE — Assessment & Plan Note (Signed)
Refilled by phone via e request.

## 2013-04-13 ENCOUNTER — Encounter (HOSPITAL_COMMUNITY): Payer: Self-pay | Admitting: Emergency Medicine

## 2013-04-13 ENCOUNTER — Emergency Department (HOSPITAL_COMMUNITY)
Admission: EM | Admit: 2013-04-13 | Discharge: 2013-04-13 | Disposition: A | Discharge status: Home / Self Care | Payer: PRIVATE HEALTH INSURANCE | Attending: Emergency Medicine | Admitting: Emergency Medicine

## 2013-04-13 DIAGNOSIS — I129 Hypertensive chronic kidney disease with stage 1 through stage 4 chronic kidney disease, or unspecified chronic kidney disease: Secondary | ICD-10-CM | POA: Insufficient documentation

## 2013-04-13 DIAGNOSIS — F410 Panic disorder [episodic paroxysmal anxiety] without agoraphobia: Secondary | ICD-10-CM | POA: Insufficient documentation

## 2013-04-13 DIAGNOSIS — F17200 Nicotine dependence, unspecified, uncomplicated: Secondary | ICD-10-CM | POA: Insufficient documentation

## 2013-04-13 DIAGNOSIS — Z880 Allergy status to penicillin: Secondary | ICD-10-CM | POA: Insufficient documentation

## 2013-04-13 DIAGNOSIS — M62830 Muscle spasm of back: Secondary | ICD-10-CM

## 2013-04-13 DIAGNOSIS — I509 Heart failure, unspecified: Secondary | ICD-10-CM | POA: Insufficient documentation

## 2013-04-13 DIAGNOSIS — N183 Chronic kidney disease, stage 3 unspecified: Secondary | ICD-10-CM | POA: Insufficient documentation

## 2013-04-13 DIAGNOSIS — K5090 Crohn's disease, unspecified, without complications: Secondary | ICD-10-CM | POA: Insufficient documentation

## 2013-04-13 DIAGNOSIS — Z7902 Long term (current) use of antithrombotics/antiplatelets: Secondary | ICD-10-CM | POA: Insufficient documentation

## 2013-04-13 DIAGNOSIS — M62838 Other muscle spasm: Secondary | ICD-10-CM | POA: Insufficient documentation

## 2013-04-13 DIAGNOSIS — E785 Hyperlipidemia, unspecified: Secondary | ICD-10-CM | POA: Insufficient documentation

## 2013-04-13 DIAGNOSIS — Z862 Personal history of diseases of the blood and blood-forming organs and certain disorders involving the immune mechanism: Secondary | ICD-10-CM | POA: Insufficient documentation

## 2013-04-13 DIAGNOSIS — J45909 Unspecified asthma, uncomplicated: Secondary | ICD-10-CM | POA: Insufficient documentation

## 2013-04-13 DIAGNOSIS — Z79899 Other long term (current) drug therapy: Secondary | ICD-10-CM | POA: Insufficient documentation

## 2013-04-13 MED ORDER — CYCLOBENZAPRINE HCL 5 MG PO TABS: 5.0000 mg | ORAL_TABLET | Freq: Three times a day (TID) | ORAL | Status: DC | PRN

## 2013-04-13 NOTE — ED Provider Notes (Signed)
CSN: [id number]     Arrival date & time 04/13/13  1751 History   First MD Initiated Contact with Patient 04/13/13 0715     Chief Complaint  Patient presents with  . Neck Pain   (Consider location/radiation/quality/duration/timing/severity/associated sxs/prior Treatment) Patient is a 62 y.o. female presenting with neck pain.  Neck Pain Associated symptoms: no chest pain, no fever, no headaches (at time of neck pain or at current time), no numbness and no weakness    Patient is a 62 yo female with a history of HTN, HLD, crohn's disease, CHF, and CKD who presents with acute onset neck tightness. States started this morning while she was driving her cousin to work. She noted that suddenly she had this tightness/pain in the back of her neck. Described as throbbing and tight. 7/10. Has never had this before. Denies headache at this time, numbness, tingling, weakness, chest pain, shortness of breath, abdominal pain, difficulty urinating, and edema. She notes she typically takes tylenol for pain and typically does this 2x/month for headaches.   Past Medical History  Diagnosis Date  . Crohn's disease   . Hypertension   . Hyperlipidemia   . CHF (congestive heart failure)     EF 30-35% 2012->EF 60-65% 2013  . GERD (gastroesophageal reflux disease)   . 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 Anxiety   . Panic attacks   . CKD (chronic kidney disease), stage III   . Anemia, chronic renal failure   . Asthma 05/2011  . Headache(784.0)     "related to high BP" (05/30/2012)  . Tobacco abuse   . Abnormal chest CT     Coronary atherosclerosis on chest CT 2012   Past Surgical History  Procedure Laterality Date  . 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 Ectopic pregnancy surgery  ?1980's    left  . Cholecystectomy  01/28/2005  . Cataract extraction w/ intraocular lens  implant, bilateral  ~ 2000  . Ileostomy  ?  2002   Family History  Problem Relation Age of Onset  . Heart disease Mother   . Stroke  Father    History  Substance Use Topics  . Smoking status: Current Every Day Smoker -- 0.12 packs/day for 18 years    Types: Cigarettes  . Smokeless tobacco: Never Used     Comment: USES E CIGARETTE  . Alcohol Use: No     Comment: 05/30/2012 "used to drink back in the day; last alcohol 23 yr ago"   OB History   Grav Para Term Preterm Abortions TAB SAB Ect Mult Living                 Review of Systems  Constitutional: Negative for fever.  Respiratory: Negative for chest tightness and shortness of breath.   Cardiovascular: Negative for chest pain and leg swelling.  Gastrointestinal: Negative for abdominal pain.  Genitourinary: Negative for difficulty urinating.  Musculoskeletal: Positive for neck pain.  Neurological: Negative for dizziness, weakness, light-headedness, numbness and headaches (at time of neck pain or at current time).    Allergies  Amoxicillin; Aspirin; Morphine and related; Penicillins; Ace inhibitors; and Gabapentin  Home Medications   Current Outpatient Rx  Name  Route  Sig  Dispense  Refill  . acetaminophen (TYLENOL) 500 MG tablet   Oral   Take 1,000 mg by mouth every 6 (six) hours as needed for pain.         . calcium carbonate (TUMS - DOSED IN MG ELEMENTAL  CALCIUM) 500 MG chewable tablet   Oral   Chew 1 tablet by mouth daily.         . citalopram (CELEXA) 20 MG tablet      take 1 tablet by mouth once daily   90 tablet   3   . clopidogrel (PLAVIX) 75 MG tablet   Oral   Take 1 tablet (75 mg total) by mouth daily with breakfast.   30 tablet   6   . LORazepam (ATIVAN) 0.5 MG tablet      take 1 tablet by mouth twice a day for anxiety   60 tablet   5   . Mesalamine 800 MG TBEC   Oral   Take 1 tablet (800 mg total) by mouth 3 (three) times daily.   180 tablet   5   . metoprolol succinate (TOPROL-XL) 50 MG 24 hr tablet      Take 1/2 tab daily   45 tablet   3   . simvastatin (ZOCOR) 20 MG tablet   Oral   Take 1 tablet (20 mg  total) by mouth at bedtime.   90 tablet   3    BP 165/90  Pulse 82  Temp(Src) 97.8 F (36.6 C) (Oral)  Resp 20  SpO2 100% Physical Exam  Constitutional: She appears well-developed and well-nourished.  HENT:  Head: Normocephalic and atraumatic.  Mouth/Throat: Oropharynx is clear and moist.  Eyes: Conjunctivae are normal. Pupils are equal, round, and reactive to light.  Neck: Normal range of motion. Neck supple.  Cardiovascular: Normal rate, regular rhythm and normal heart sounds.   Pulmonary/Chest: Effort normal and breath sounds normal.  Abdominal: Soft. She exhibits no distension. There is no tenderness.  Musculoskeletal: She exhibits no edema.  Posterior upper back in the distribution of bilateral trapezius muscles mildly tender to palpation, most reproducible on the right side, small amount of spasm noted bilaterally, cervical spinous processes non-tender  Neurological: She is alert.  CN 2-12 intact, 5/5 strength throughout bilateral upper and lower extremities, sensation to light touch intact in bilateral upper and lower extremities, patellar reflexes intact  Skin: Skin is warm and dry.    ED Course  Procedures (including critical care time) Labs Review Labs Reviewed - No data to display Imaging Review No results found.  MDM   1. Back muscle spasm    Patient seen and examined. Patient with acute onset posterior neck pain. No injury per patient. With full ROM and no spinous process tenderness making cervical injury unlikely. Patient with lack of neurological symptoms and no headache at time of onset of neck tightness making vertebral dissection or cervical disc disease unlikely. With spasm and tenderness to palpation this is most likely a muscle spasm or strain. Given that patient does not have anyone to drive her home will send home with a prescription for flexeril for her spasm and advise the patient to use tylenol for pain given her CKD and CHF diagnoses. This was  discussed with the patient. She is stable for discharge from the ED. Return precautions discussed with the patient. The patient was seen and discussed with Dr. Jeanell Sparrow.  Tommi Rumps, MD [facility] PGY-2 04/13/13 8:08 am  Leone Haven, MD 04/13/13 [phone]

## 2013-04-13 NOTE — ED Provider Notes (Signed)
62 yo female with neck/ upper back pain.  She has ttp over right trapezius.  Normal neuro exam.  No fever or neck rigidity.  Patient seen and evaluated.   I performed a history and physical examination of [name] [name] and discussed her management with Dr. Caryl Bis.  I agree with the history, physical, assessment, and plan of care, with the following exceptions: None  I was present for the following procedures: None Time Spent in Critical Care of the patient: None Time spent in discussions with the patient and family: 7  Jozeph Persing S    Shaune Pollack, MD 04/13/13 1043

## 2013-04-13 NOTE — ED Notes (Signed)
Patient states neck pain starting at base of shoulders radiating upwards

## 2013-05-03 ENCOUNTER — Ambulatory Visit (INDEPENDENT_AMBULATORY_CARE_PROVIDER_SITE_OTHER): Payer: PRIVATE HEALTH INSURANCE | Admitting: Family Medicine

## 2013-05-03 ENCOUNTER — Encounter: Payer: Self-pay | Admitting: Family Medicine

## 2013-05-03 VITALS — BP 108/64 | HR 74 | Temp 99.0°F | Ht 66.0 in | Wt 141.1 lb

## 2013-05-03 DIAGNOSIS — L259 Unspecified contact dermatitis, unspecified cause: Secondary | ICD-10-CM

## 2013-05-03 DIAGNOSIS — K5090 Crohn's disease, unspecified, without complications: Secondary | ICD-10-CM

## 2013-05-03 DIAGNOSIS — Z124 Encounter for screening for malignant neoplasm of cervix: Secondary | ICD-10-CM

## 2013-05-03 DIAGNOSIS — F411 Generalized anxiety disorder: Secondary | ICD-10-CM

## 2013-05-03 DIAGNOSIS — L309 Dermatitis, unspecified: Secondary | ICD-10-CM | POA: Insufficient documentation

## 2013-05-03 DIAGNOSIS — IMO0002 Reserved for concepts with insufficient information to code with codable children: Secondary | ICD-10-CM

## 2013-05-03 DIAGNOSIS — R6889 Other general symptoms and signs: Secondary | ICD-10-CM

## 2013-05-03 DIAGNOSIS — F419 Anxiety disorder, unspecified: Secondary | ICD-10-CM

## 2013-05-03 MED ORDER — TRIAMCINOLONE ACETONIDE 0.5 % EX OINT: TOPICAL_OINTMENT | Freq: Two times a day (BID) | CUTANEOUS | Status: DC

## 2013-05-04 ENCOUNTER — Other Ambulatory Visit (HOSPITAL_COMMUNITY)
Admission: RE | Admit: 2013-05-04 | Discharge: 2013-05-04 | Disposition: A | Discharge status: Home / Self Care | Payer: Medicare Other | Source: Ambulatory Visit | Attending: Family Medicine | Admitting: Family Medicine

## 2013-05-04 DIAGNOSIS — Z1151 Encounter for screening for human papillomavirus (HPV): Secondary | ICD-10-CM | POA: Insufficient documentation

## 2013-05-04 NOTE — Assessment & Plan Note (Signed)
GI referral.  Stable symptomatically.

## 2013-05-04 NOTE — Assessment & Plan Note (Signed)
Citalopram not helping.  Will just treat with benzo.

## 2013-05-04 NOTE — Assessment & Plan Note (Signed)
Repeated today as per plan.

## 2013-05-04 NOTE — Patient Instructions (Signed)
I will call with pap results. I will be happy to order your ostomy supplies. I placed the order for a GI referral.

## 2013-05-04 NOTE — Progress Notes (Signed)
  Subjective:    Patient ID: [name] [name], female    DOB: 1950/11/23, 62 y.o.   MRN: [id number]  HPI Doing well.  Needs GI referral authorization to see Amedeo Plenty for Crohns.   Due for PAP.  Had pap 6 months ago and could not do colpo.  Recommended repeat pap in 6 months with co testing. Wants to simplify meds if possible.      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  She was able to tolerate pap.  Cervix was friable.  Due to discomfort and anxiety, did not subject her to bimanual.          Assessment & Plan:

## 2013-05-11 ENCOUNTER — Telehealth: Payer: Self-pay | Admitting: Family Medicine

## 2013-05-11 DIAGNOSIS — IMO0002 Reserved for concepts with insufficient information to code with codable children: Secondary | ICD-10-CM

## 2013-05-11 NOTE — Telephone Encounter (Signed)
Pap still abnormal.  Fortunately, no high risk HPV.  Called and left message that she will need follow up.  I am currently working on where best referral might be.  By my read, colop is mandatory at this point.

## 2013-05-15 ENCOUNTER — Inpatient Hospital Stay (HOSPITAL_COMMUNITY)
Admission: EM | Admit: 2013-05-15 | Discharge: 2013-05-20 | DRG: 683 | Disposition: A | Discharge status: Home / Self Care | Payer: PRIVATE HEALTH INSURANCE | Attending: Family Medicine | Admitting: Family Medicine

## 2013-05-15 ENCOUNTER — Encounter (HOSPITAL_COMMUNITY): Payer: Self-pay | Admitting: Emergency Medicine

## 2013-05-15 ENCOUNTER — Emergency Department (HOSPITAL_COMMUNITY): Payer: PRIVATE HEALTH INSURANCE

## 2013-05-15 DIAGNOSIS — K5090 Crohn's disease, unspecified, without complications: Secondary | ICD-10-CM | POA: Diagnosis present

## 2013-05-15 DIAGNOSIS — I2510 Atherosclerotic heart disease of native coronary artery without angina pectoris: Secondary | ICD-10-CM | POA: Diagnosis present

## 2013-05-15 DIAGNOSIS — N189 Chronic kidney disease, unspecified: Secondary | ICD-10-CM

## 2013-05-15 DIAGNOSIS — I10 Essential (primary) hypertension: Secondary | ICD-10-CM

## 2013-05-15 DIAGNOSIS — F411 Generalized anxiety disorder: Secondary | ICD-10-CM | POA: Diagnosis present

## 2013-05-15 DIAGNOSIS — D631 Anemia in chronic kidney disease: Secondary | ICD-10-CM | POA: Diagnosis present

## 2013-05-15 DIAGNOSIS — N184 Chronic kidney disease, stage 4 (severe): Secondary | ICD-10-CM | POA: Diagnosis present

## 2013-05-15 DIAGNOSIS — R197 Diarrhea, unspecified: Secondary | ICD-10-CM

## 2013-05-15 DIAGNOSIS — I509 Heart failure, unspecified: Secondary | ICD-10-CM | POA: Diagnosis present

## 2013-05-15 DIAGNOSIS — E876 Hypokalemia: Secondary | ICD-10-CM

## 2013-05-15 DIAGNOSIS — E7800 Pure hypercholesterolemia, unspecified: Secondary | ICD-10-CM | POA: Diagnosis present

## 2013-05-15 DIAGNOSIS — F17200 Nicotine dependence, unspecified, uncomplicated: Secondary | ICD-10-CM | POA: Diagnosis present

## 2013-05-15 DIAGNOSIS — E860 Dehydration: Secondary | ICD-10-CM | POA: Diagnosis present

## 2013-05-15 DIAGNOSIS — Z79899 Other long term (current) drug therapy: Secondary | ICD-10-CM

## 2013-05-15 DIAGNOSIS — N179 Acute kidney failure, unspecified: Secondary | ICD-10-CM

## 2013-05-15 DIAGNOSIS — N183 Chronic kidney disease, stage 3 unspecified: Secondary | ICD-10-CM | POA: Diagnosis present

## 2013-05-15 DIAGNOSIS — I129 Hypertensive chronic kidney disease with stage 1 through stage 4 chronic kidney disease, or unspecified chronic kidney disease: Secondary | ICD-10-CM | POA: Diagnosis present

## 2013-05-15 DIAGNOSIS — E872 Acidosis, unspecified: Secondary | ICD-10-CM

## 2013-05-15 DIAGNOSIS — R109 Unspecified abdominal pain: Secondary | ICD-10-CM

## 2013-05-15 DIAGNOSIS — E785 Hyperlipidemia, unspecified: Secondary | ICD-10-CM | POA: Diagnosis present

## 2013-05-15 DIAGNOSIS — Z7902 Long term (current) use of antithrombotics/antiplatelets: Secondary | ICD-10-CM

## 2013-05-15 DIAGNOSIS — E875 Hyperkalemia: Secondary | ICD-10-CM | POA: Diagnosis present

## 2013-05-15 DIAGNOSIS — R1110 Vomiting, unspecified: Secondary | ICD-10-CM

## 2013-05-15 DIAGNOSIS — Z933 Colostomy status: Secondary | ICD-10-CM

## 2013-05-15 DIAGNOSIS — E874 Mixed disorder of acid-base balance: Secondary | ICD-10-CM

## 2013-05-15 DIAGNOSIS — F410 Panic disorder [episodic paroxysmal anxiety] without agoraphobia: Secondary | ICD-10-CM | POA: Diagnosis not present

## 2013-05-15 DIAGNOSIS — F419 Anxiety disorder, unspecified: Secondary | ICD-10-CM | POA: Diagnosis present

## 2013-05-15 DIAGNOSIS — I5020 Unspecified systolic (congestive) heart failure: Secondary | ICD-10-CM | POA: Diagnosis present

## 2013-05-15 DIAGNOSIS — J449 Chronic obstructive pulmonary disease, unspecified: Secondary | ICD-10-CM | POA: Diagnosis present

## 2013-05-15 DIAGNOSIS — R571 Hypovolemic shock: Secondary | ICD-10-CM

## 2013-05-15 DIAGNOSIS — E559 Vitamin D deficiency, unspecified: Secondary | ICD-10-CM

## 2013-05-15 LAB — COMPREHENSIVE METABOLIC PANEL
ALT: 49 U/L — ABNORMAL HIGH (ref 0–35)
Albumin: 4.6 g/dL (ref 3.5–5.2)
Alkaline Phosphatase: 209 U/L — ABNORMAL HIGH (ref 39–117)
Calcium: 9.9 mg/dL (ref 8.4–10.5)
Potassium: 6.1 mEq/L — ABNORMAL HIGH (ref 3.5–5.1)
Sodium: 130 mEq/L — ABNORMAL LOW (ref 135–145)
Total Protein: 10.5 g/dL — ABNORMAL HIGH (ref 6.0–8.3)

## 2013-05-15 LAB — POCT I-STAT 3, ART BLOOD GAS (G3+)
Acid-base deficit: 22 mmol/L — ABNORMAL HIGH (ref 0.0–2.0)
Bicarbonate: 6.4 mEq/L — ABNORMAL LOW (ref 20.0–24.0)
O2 Saturation: 95 %
Patient temperature: 98.6
TCO2: 7 mmol/L (ref 0–100)
pCO2 arterial: 21 mmHg — ABNORMAL LOW (ref 35.0–45.0)
pO2, Arterial: 103 mmHg — ABNORMAL HIGH (ref 80.0–100.0)

## 2013-05-15 LAB — CBC WITH DIFFERENTIAL/PLATELET
Basophils Absolute: 0 10*3/uL (ref 0.0–0.1)
Basophils Relative: 0 % (ref 0–1)
Eosinophils Absolute: 0.1 10*3/uL (ref 0.0–0.7)
Eosinophils Relative: 1 % (ref 0–5)
MCH: 31.8 pg (ref 26.0–34.0)
MCHC: 33.7 g/dL (ref 30.0–36.0)
Neutrophils Relative %: 46 % (ref 43–77)
Platelets: 232 10*3/uL (ref 150–400)
RBC: 4.66 MIL/uL (ref 3.87–5.11)
RDW: 14.4 % (ref 11.5–15.5)

## 2013-05-15 LAB — CG4 I-STAT (LACTIC ACID): Lactic Acid, Venous: 1.08 mmol/L (ref 0.5–2.2)

## 2013-05-15 MED ORDER — MESALAMINE 400 MG PO CPDR
800.0000 mg | DELAYED_RELEASE_CAPSULE | Freq: Three times a day (TID) | ORAL | Status: DC
  Administered 2013-05-16 – 2013-05-20 (×14): 800 mg via ORAL
  Filled 2013-05-15 (×17): qty 2

## 2013-05-15 MED ORDER — SODIUM BICARBONATE 8.4 % IV SOLN: 50.0000 meq | Freq: Once | INTRAVENOUS | Status: DC

## 2013-05-15 MED ORDER — DEXTROSE 5 % IV SOLN
1.0000 g | Freq: Once | INTRAVENOUS | Status: AC
  Administered 2013-05-15: 1 g via INTRAVENOUS
  Filled 2013-05-15: qty 10

## 2013-05-15 MED ORDER — ONDANSETRON HCL 4 MG/2ML IJ SOLN
4.0000 mg | Freq: Once | INTRAMUSCULAR | Status: AC
  Administered 2013-05-15: 4 mg via INTRAVENOUS
  Filled 2013-05-15: qty 2

## 2013-05-15 MED ORDER — SODIUM CHLORIDE 0.9 % IV BOLUS (SEPSIS)
1000.0000 mL | Freq: Once | INTRAVENOUS | Status: AC
  Administered 2013-05-15: 1000 mL via INTRAVENOUS

## 2013-05-15 MED ORDER — HYDROMORPHONE HCL PF 1 MG/ML IJ SOLN: 1.0000 mg | Freq: Once | INTRAMUSCULAR | Status: DC

## 2013-05-15 MED ORDER — SODIUM BICARBONATE 8.4 % IV SOLN
100.0000 meq | Freq: Once | INTRAVENOUS | Status: AC
  Administered 2013-05-15: 100 meq via INTRAVENOUS
  Filled 2013-05-15: qty 50

## 2013-05-15 NOTE — ED Notes (Signed)
Patient transported to X-ray 

## 2013-05-15 NOTE — ED Notes (Signed)
I stat lactic acid results given to Dr. Reather Converse by B. Yolanda Bonine, EMT

## 2013-05-15 NOTE — H&P (Signed)
[facility] Admission History and Physical Service Pager: [phone]  Patient name: [name] [name] Medical record number: [id number] Date of birth: 09/19/1950 Age: 62 y.o. Gender: female  Primary Care Provider: Zigmund Gottron, MD Consultants: Nephrology, Critical care medicine Code Status: Full code, wants daughter to make medical decisions if she lacks capacity.   Chief Complaint: Weakness, high colostomy output.   Assessment and Plan: Cleopha Indelicato is a 62 y.o. female presenting with vomiting and increased colostomy output found to have high anion gap metabolic acidosis. PMH is significant for crohn's disease s/p surgical resection.    # Acute renal failure on CKD stage III. Creatinine baseline seems to be worsening (~2.0), but has never been >3, and is 6.46. Found to be acidotic (pH 7.089) on arrival with bicarb of 8, secondary to acute severe volume depletion causing decreased renal acid clearance and loss of bicarbonate from ostomy.  - High anion gap metabolic acidosis (ABG: 1.950/93/267/1.2/-45/80%) with anion gap (25). Will check BMP serially to monitor for improvement.  - s/p 3L total bolus in ED, continuing aggressive IV hydration. - s/p bicarbonate 15mq in ED, starting bicarbonate drip to replace GI losses.  - Elevation of lipase (116) and BNP (375.3) thought to be due to decreased renal clearance, not pancreatitis or volume overload. Last echo normal.    - Hyperkalemia (5.9): ECG without concerning changes. Will continue monitoring. Repeat ECG in AM.  - Critical care team has been made aware of this patient.   # Chest pain Non-anginal per history. Likely GI-related, given PMH of GERD/reflux esophagitis. Was prescribed PPI following chest pain admission Nov 2013. Will rule out ACS.  - Cycle troponins - ECG without concern for ischemia, will repeat in AM - Monitor on telemetry  # CAD, HTN, Hypercholesterolemia:  - History of viral myocarditis in  1990 and HFrEF, EF 30-35% on echo Nov 2012; improved to 60-65% with no mention of diastolic dysfunction on most recent echo (June 2013). [facility] BNP elevation not thought to be due to increased production from CHF, but decreased clearance.  - CXR with subsegmental atelectasis and no signs of volume overload.  - Continue home plavix, which was started during admission Nov 2013 after episode of chest pain. Cath deferred at that time due to CKD. Has documented anaphylactic reaction to ASA. - Holding home toprol-XL 28mdue to hypotension.  - Not on ACE inhibitor due to CKD - Continue statin. Most recent lipid profile with LDL 76, HDL 38, total 142, trigs 139.   # High ostomy output Unknown etiology, has a history of gastroenteritis causing this previously vs Crohn's flare.  - CT abdomen/pelvis is unremarkable with no acute findings suggestive of etiology.  - No leukocytosis or fever, abdominal exam not concerning for acute intra-abdominal emergency. CBC in AM - Symptomatic management with IV hydration, zofran IV prn - Will continue Mesalamine start tomorrow (we are aware of pt's anaphilactic reaction to ASA and this medication, but she takes it daily without known side effects) - GI consult if not improvement with present treatment.   # COPD - No home medications listed, no oxygen requirement at this time.  - Respiratory compensation for metabolic acidosis is appropriate (CO2 21; between 20-24)  # History of anemia of chronic disease - CBC wnl on arrival, suspect all values to go down after rehydration. Will monitor with CBC in AM.  - Last iron studies were normal in Feb 2013. Will defer further work up to PCP.   #  Vitamin D deficiency  - No home supplementation noted in EMR, last checked May 2013 and was 17. Defer further management to PCP  # Tobacco abuse - Cessation counseling prior to discharge.   FEN/GI: D5W @300cc /hr, NPO until need for intubation becomes  less likely Prophylaxis: Subcutaneous heparin.   Disposition: Admit to SDU for further management.   History of Present Illness: [name] [name] is a 62 y.o. female presenting with vomiting and increased colostomy output.   She complains of essentially continuous nausea and innumerable episodes of vomiting for the past 2 days with some associated abdominal pain. She's not sure if she's had a fever, but has had chills. She reports a painful burning sensation in her chest for about a week made worse by laying on her left side. Not exertional and no nitroglycerin was taken. She has a history of crohn's disease and has had colostomy in 1997 followed by a retuck surgery. She has changed her ostomy bag 10 times yesterday and changed it multiple times this morning as well. She called the family practice center for an appointment to be seen, but was not able to be worked in. By the early morning today she felt ill enough to present to the ED. On arrival she was found to have grossly abnormal labs consistent with acute renal failure and acidosis. She reports her nausea is improved with zofran and is not experiencing active chest pain.   She lives alone and performs ADLs independently. She has a daughter who lives in [place], and she has never seen a nephrologist.   Review Of Systems: Per HPI Otherwise 12 point review of systems was performed and was unremarkable.  Patient Active Problem List   Diagnosis Date Noted  . Eczema 05/03/2013  . Low BP 02/08/2013  . CAD (coronary artery disease) 05/30/2012  . Tobacco abuse 05/30/2012  . COPD (chronic obstructive pulmonary disease) 05/30/2012  . Anemia in chronic kidney disease 05/30/2012  . Vitamin d deficiency 12/02/2011  . Osteopenia 10/05/2011  . Abnormal Pap smear 09/21/2011  . Left renal artery stenosis 09/04/2011  . Chronic kidney disease (CKD), stage III (moderate) 07/30/2011  . Seizure 03/13/2011  . Hypertension, benign 09/25/2010  .  HYPERCHOLESTEROLEMIA 01/10/2007  . Anxiety 09/02/2006  . NEUROPATHY, PERIPHERAL 09/02/2006  . Reflux esophagitis 09/02/2006  . CROHN'S DISEASE 09/02/2006   Past Medical History: Past Medical History  Diagnosis Date  . Crohn's disease   . Hypertension   . Hyperlipidemia   . CHF (congestive heart failure)     EF 30-35% 2012->EF 60-65% 2013  . GERD (gastroesophageal reflux disease)   . 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 Anxiety   . Panic attacks   . CKD (chronic kidney disease), stage III   . Anemia, chronic renal failure   . Asthma 05/2011  . Headache(784.0)     "related to high BP" (05/30/2012)  . Tobacco abuse   . Abnormal chest CT     Coronary atherosclerosis on chest CT 2012   Past Surgical History: Past Surgical History  Procedure Laterality Date  . 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 Ectopic pregnancy surgery  ?1980's    left  . Cholecystectomy  01/28/2005  . Cataract extraction w/ intraocular lens  implant, bilateral  ~ 2000  . Ileostomy  ?  2002   Social History: History  Substance Use Topics  . Smoking status: Current Every Day Smoker -- 0.12 packs/day for 18 years    Types: Cigarettes  .  Smokeless tobacco: Never Used     Comment: USES E CIGARETTE  . Alcohol Use: No     Comment: 05/30/2012 "used to drink back in the day; last alcohol 23 yr ago"   Please also refer to relevant sections of EMR.  Family History: Family History  Problem Relation Age of Onset  . Heart disease Mother   . Stroke Father    Allergies and Medications: Allergies  Allergen Reactions  . Amoxicillin Anaphylaxis and Rash    REACTION:Throat Swelling  . Aspirin Anaphylaxis, Itching and Rash  . Morphine And Related Anaphylaxis  . Penicillins Anaphylaxis and Rash  . Ace Inhibitors Cough    ACE possibly associated with cough and switched to ARB.  Would be OK to re-challenge if needed.  . Gabapentin     Patient had one time seizure shortly after stopping gabapentin   No current  facility-administered medications on file prior to encounter.   Current Outpatient Prescriptions on File Prior to Encounter  Medication Sig Dispense Refill  . acetaminophen (TYLENOL) 500 MG tablet Take 1,000 mg by mouth every 6 (six) hours as needed for pain.      . calcium carbonate (TUMS - DOSED IN MG ELEMENTAL CALCIUM) 500 MG chewable tablet Chew 1 tablet by mouth daily.      . Mesalamine 800 MG TBEC Take 1 tablet (800 mg total) by mouth 3 (three) times daily.  180 tablet  5  . simvastatin (ZOCOR) 20 MG tablet Take 1 tablet (20 mg total) by mouth at bedtime.  90 tablet  3  . triamcinolone ointment (KENALOG) 0.5 % Apply topically 2 (two) times daily.  30 g  3  . clopidogrel (PLAVIX) 75 MG tablet Take 1 tablet (75 mg total) by mouth daily with breakfast.  30 tablet  6    Objective: BP 89/51  Pulse 100  Temp(Src) 99 F (37.2 C) (Oral)  Resp 21  Wt 136 lb 6 oz (61.859 kg)  SpO2 100% Exam: General: NAD, communicative and cooperative with examination. HEENT: dry mucous membranes. Neck supple. No JVD. No adenopathies.  CV: Regular rate and rhythm, no murmurs rubs or gallops PULM: Clear to auscultation bilaterally. No wheezes/rales/rhonchi ABD: Soft, non tender, non distended, ostomy covered with bag. No leakage, no surrounded erythema. Normal BS.  EXT: No edema Neuro: Alert and oriented x3. No focalization   Labs and Imaging: CBC BMET   Recent Labs Lab 05/15/13 1811  WBC 9.5  HGB 14.8  HCT 43.9  PLT 232    Recent Labs Lab 05/15/13 1811 05/15/13 2032  NA 130*  --   K 6.1* 5.9*  CL 97  --   CO2 8*  --   BUN 50*  --   CREATININE 6.46*  --   GLUCOSE 92  --   CALCIUM 9.9  --       05/15/2013 20:47  pH, Arterial 7.089 (LL)  pCO2 arterial 21.0 (L)  pO2, Arterial 103.0 (H)  Bicarbonate 6.4 (L)  TCO2 7  Acid-base deficit 22.0 (H)  O2 Saturation 95.0  Patient temperature 98.6 F   CT ABDOMEN/PELVIS FINDINGS:  Visualized lung bases appear normal. These unenhanced  images  demonstrate no focal abnormality involving the liver, spleen or  pancreas. Status post cholecystectomy. Adrenal glands appear normal.  Atherosclerotic calcifications of abdominal aorta are noted without  aneurysm formation. No hydronephrosis or renal obstruction is noted.  No renal or ureteral calculi are noted. Status post colectomy with  ostomy seen in left lower quadrant.  Small bowel is again noted in  the associated hernia without evidence of bowel obstruction or  ileus. No definite inflammatory changes are noted. No abnormal fluid  collection is noted. Urinary bladder is decompressed. Uterus appears  normal.  IMPRESSION:  Stable postsurgical changes compared to prior exam. No acute  abnormality seen and abdomen or pelvis.  CXR FINDINGS:  The heart size and mediastinal contours are within normal limits.  Right lung is clear. Curvilinear density is seen in lingular region  consistent with scarring or subsegmental atelectasis. No  pneumothorax or pleural effusion is noted. The visualized skeletal  structures are unremarkable.  IMPRESSION:  Curvilinear lingular density consistent with scarring or  subsegmental atelectasis.  ECG Sinus tachycardia with normal axis, no conduction delay, no evidence of ischemia, and no peaked T waves.   Vance Gather, MD 05/15/2013, 10:18 PM PGY-1, [place] Intern pager: [phone], text pages welcome   Teaching Service Addendum. I have seen and evaluated this pt and agree with Dr. Bonner Puna assessment and plan as is documented on this note. I have also discussed pt with Nephrology who gave recommendations and instructed [place] to call back for further consultation if pt does not improve with hydration and bicarbonate infusion.   D. [place] Philippa Sicks, MD Family Medicine  PGY-3

## 2013-05-15 NOTE — ED Notes (Signed)
Pt has crohn's and reports vomiting abdominal pain, chest pain, and shortness of breath.  Hands cold to touch.

## 2013-05-15 NOTE — ED Notes (Signed)
Respiratory paged for ABG.  

## 2013-05-15 NOTE — ED Notes (Signed)
Pt undressed, in gown, on monitor, continuous pulse oximetry and blood pressure cuff 

## 2013-05-16 ENCOUNTER — Ambulatory Visit: Payer: Medicare Other | Admitting: Family Medicine

## 2013-05-16 ENCOUNTER — Other Ambulatory Visit: Payer: Self-pay

## 2013-05-16 DIAGNOSIS — R578 Other shock: Secondary | ICD-10-CM

## 2013-05-16 DIAGNOSIS — N179 Acute kidney failure, unspecified: Principal | ICD-10-CM

## 2013-05-16 LAB — BASIC METABOLIC PANEL
BUN: 51 mg/dL — ABNORMAL HIGH (ref 6–23)
BUN: 46 mg/dL — ABNORMAL HIGH (ref 6–23)
BUN: 40 mg/dL — ABNORMAL HIGH (ref 6–23)
CO2: 28 mEq/L (ref 19–32)
Calcium: 6.9 mg/dL — ABNORMAL LOW (ref 8.4–10.5)
Chloride: 107 mEq/L (ref 96–112)
Chloride: 100 mEq/L (ref 96–112)
Chloride: 101 mEq/L (ref 96–112)
Creatinine, Ser: 5.3 mg/dL — ABNORMAL HIGH (ref 0.50–1.10)
Creatinine, Ser: 4.15 mg/dL — ABNORMAL HIGH (ref 0.50–1.10)
Creatinine, Ser: 3.42 mg/dL — ABNORMAL HIGH (ref 0.50–1.10)
GFR calc Af Amer: 9 mL/min — ABNORMAL LOW (ref >90)
GFR calc Af Amer: 13 mL/min — ABNORMAL LOW (ref >90)
GFR calc non Af Amer: 8 mL/min — ABNORMAL LOW (ref >90)
GFR calc non Af Amer: 14 mL/min — ABNORMAL LOW (ref >90)
Glucose, Bld: 106 mg/dL — ABNORMAL HIGH (ref 70–99)
Glucose, Bld: 120 mg/dL — ABNORMAL HIGH (ref 70–99)
Glucose, Bld: 120 mg/dL — ABNORMAL HIGH (ref 70–99)
Potassium: 2.7 mEq/L — CL (ref 3.5–5.1)

## 2013-05-16 LAB — CBC
HCT: 31.7 % — ABNORMAL LOW (ref 36.0–46.0)
MCH: 31.1 pg (ref 26.0–34.0)
MCHC: 33.4 g/dL (ref 30.0–36.0)
RDW: 14.1 % (ref 11.5–15.5)

## 2013-05-16 LAB — URINALYSIS, ROUTINE W REFLEX MICROSCOPIC
Bilirubin Urine: NEGATIVE
Glucose, UA: NEGATIVE mg/dL
Ketones, ur: NEGATIVE mg/dL
Protein, ur: 30 mg/dL — AB
Urobilinogen, UA: 0.2 mg/dL (ref 0.0–1.0)

## 2013-05-16 LAB — TROPONIN I
Troponin I: <0.3 ng/mL (ref <0.30)
Troponin I: <0.3 ng/mL (ref <0.30)

## 2013-05-16 LAB — URINE MICROSCOPIC-ADD ON

## 2013-05-16 MED ORDER — POTASSIUM CHLORIDE IN NACL 20-0.45 MEQ/L-% IV SOLN
INTRAVENOUS | Status: DC
  Administered 2013-05-16: 1000 mL via INTRAVENOUS
  Administered 2013-05-16: 100 mL/h via INTRAVENOUS
  Administered 2013-05-17 (×2): via INTRAVENOUS
  Administered 2013-05-18: 1000 mL via INTRAVENOUS
  Filled 2013-05-16 (×7): qty 1000

## 2013-05-16 MED ORDER — CLOPIDOGREL BISULFATE 75 MG PO TABS: 75.0000 mg | ORAL_TABLET | Freq: Every day | ORAL | Status: DC

## 2013-05-16 MED ORDER — DEXTROSE 5 % IV SOLN
INTRAVENOUS | Status: DC
  Administered 2013-05-16 – 2013-05-17 (×6): via INTRAVENOUS
  Filled 2013-05-16 (×20): qty 150

## 2013-05-16 MED ORDER — ONDANSETRON HCL 4 MG/2ML IJ SOLN: 4.0000 mg | Freq: Three times a day (TID) | INTRAMUSCULAR | Status: DC | PRN

## 2013-05-16 MED ORDER — SIMVASTATIN 20 MG PO TABS
20.0000 mg | ORAL_TABLET | Freq: Every day | ORAL | Status: DC
  Administered 2013-05-16 – 2013-05-19 (×5): 20 mg via ORAL
  Filled 2013-05-16 (×7): qty 1

## 2013-05-16 MED ORDER — PNEUMOCOCCAL VAC POLYVALENT 25 MCG/0.5ML IJ INJ
0.5000 mL | INJECTION | INTRAMUSCULAR | Status: AC
  Administered 2013-05-17: 0.5 mL via INTRAMUSCULAR
  Filled 2013-05-16: qty 0.5

## 2013-05-16 MED ORDER — CLOPIDOGREL BISULFATE 75 MG PO TABS
75.0000 mg | ORAL_TABLET | Freq: Every day | ORAL | Status: DC
  Administered 2013-05-16 – 2013-05-20 (×5): 75 mg via ORAL
  Filled 2013-05-16 (×6): qty 1

## 2013-05-16 MED ORDER — SODIUM CHLORIDE 0.9 % IJ SOLN
3.0000 mL | Freq: Two times a day (BID) | INTRAMUSCULAR | Status: DC
  Administered 2013-05-16 – 2013-05-20 (×6): 3 mL via INTRAVENOUS

## 2013-05-16 MED ORDER — POTASSIUM CHLORIDE 10 MEQ/100ML IV SOLN
10.0000 meq | INTRAVENOUS | Status: AC
  Administered 2013-05-16 (×4): 10 meq via INTRAVENOUS
  Filled 2013-05-16 (×5): qty 100

## 2013-05-16 MED ORDER — POTASSIUM CHLORIDE CRYS ER 20 MEQ PO TBCR
40.0000 meq | EXTENDED_RELEASE_TABLET | Freq: Once | ORAL | Status: AC
  Administered 2013-05-16: 40 meq via ORAL
  Filled 2013-05-16: qty 2

## 2013-05-16 MED ORDER — HEPARIN SODIUM (PORCINE) 5000 UNIT/ML IJ SOLN
5000.0000 [IU] | Freq: Three times a day (TID) | INTRAMUSCULAR | Status: DC
  Administered 2013-05-16 – 2013-05-20 (×14): 5000 [IU] via SUBCUTANEOUS
  Filled 2013-05-16 (×19): qty 1

## 2013-05-16 NOTE — Progress Notes (Signed)
Social visit by PCP.  Appreciate great management by inpatient team.  Agree with dx of dehydration, acute on chronic renal failure and dehydration secondary to GI losses.  This is similar, only more severe, to an episode she had two or three months ago.  I am unclear if the GI losses are due to acute infection or a flair of her Crohns.  Obviously, I am concerned not only with treating this occurrence but also with preventing future episodes.    Two other issues: [name] [name] is extremely anxious and easily overwhelmed.  Approach her slowly and keep your explanations simple.  She tends to be compliant as long as she is not confused.  [name] [name] was informed that her recent repeat PAP was also abnormal.  She needs a colposcopy.  I am trying to find out where to best refer her.  In addition to her anxiety, she has a badly scarred vagina from her Crohns.  Pelvic exams are both difficult and painful for her.  We tried and failed colpo in [facility] x 1 and I need to find a place to refer her where we have a higher chance of successful colpo.  I will handle this as an outpatient.

## 2013-05-16 NOTE — Progress Notes (Signed)
Attending Addendum  I discussed the assessment and plan with Dr. Bonner Puna. I have reviewed the note and agree. Please see addendum to H&P for attending assessment.     Boykin Nearing, [place]

## 2013-05-16 NOTE — ED Provider Notes (Signed)
Medical screening examination/treatment/procedure(s) were conducted as a shared visit with non-physician practitioner(s) or resident  and myself.  I personally evaluated the patient during the encounter and agree with the findings and plan unless otherwise indicated.    I have personally reviewed any xrays and/ or EKG's with the provider and I agree with interpretation.   Worsening abd pain, vomiting and diarrhea the past 2 to 3 days.  Exam epig tender, no guarding, dry mm, tachycardia, lungs clear.  2 L fluid bolus and rocephin in ED.  Metabolic acidosis.  Medicine and ICU attending involved for decision to place in step down.  CT abdo no acute findings.    Hypovolemic shock, metabolic acidosis, vomiting, abdo pain, ARF, Hyperkalemia  [name] Clonts, MD 05/16/13 [phone]

## 2013-05-16 NOTE — Consult Note (Addendum)
WOC ostomy consult note Stoma type/location: Pt had colostomy surgery in 1997 and has been independent with pouching activities since that time. Denies any peristomal skin problems.   Stomal assessment/size: Stoma 2 inches, red and viable, prolapsed several inches.  Pt states she previously had surgery several years ago to resolve the prolapse and it returned.  It has been this way many months, she states.  She usually does not have problems with pouching but began to have a high volume of watery output several days ago and had to change her pouch 10 times in one day. She uses a one piece Kayara with clamp and belt and is not interested in changing to a high output pouch or velcro opening. Current pouch seal is leaking. Peristomal assessment: Intact skin surrounding. Output 50cc liquid brown stool, pt emptied in bathroom earlier. Ostomy pouching: 1pc. Kayara and belt Education provided: She denies questions regarding pouching activities, ordering supplies, or application.  Supplies ordered to bedside for pt use. Please re-consult if further assistance is needed.  Thank-you,  Julien Girt MSN, [place], [place], [place], [place]

## 2013-05-16 NOTE — Progress Notes (Signed)
Family Medicine Teaching Service Daily Progress Note Intern Pager: [phone]  Patient name: [name] [name] Medical record number: [id number] Date of birth: 1950-08-15 Age: 62 y.o. Gender: female  Primary Care Provider: Zigmund Gottron, MD Consultants: Nephrology, CCM Code Status: Full  Pt Overview and Major Events to Date:  11/10: Admitted to SDU with ARF and acidosis.   Assessment and Plan: Wenonah Milo is a 62 y.o. female presenting with vomiting and increased colostomy output found to have high anion gap metabolic acidosis. PMH is significant for crohn's disease s/p surgical resection.   # Acute renal failure on CKD stage III. Overall improving with hydration and bicarbonate drip. Recheck BMP now and this evening. - Creatinine baseline (~2.0) 6.46 on arrival, improving overnight (5.30).  - High anion gap metabolic acidosis (ABG: 9.518/84/166/0.6/-30/16%). - Gap closing, 25 > 18. Bicarb 8 > 11.  - s/p 3L total bolus in ED, continuing aggressive IV hydration.  - s/p bicarbonate 162mq in ED, continuing bicarbonate drip to replace GI losses.  - Elevation of lipase (116) and BNP (375.3) thought to be due to decreased renal clearance, not pancreatitis or volume overload. Last echo normal.  - Hyperkalemia improved 5.9 > 4.0: ECG remains without concerning changes.  # High ostomy output Gastroenteritis vs crohn's flare.  - CT abdomen/pelvis is unremarkable with no acute findings suggestive of etiology.  - No leukocytosis or fever, abdominal exam not concerning for acute abdomen - Symptomatic management with IV hydration, zofran IV prn  - Will continue home mesalamine. - Consider GI consult if does not continue to improve.  - Appreciate ostomy consult. Pt with education and supplies.   # Chest pain Non-anginal. Seems GI-related, given PMH of GERD/reflux esophagitis. Was prescribed PPI following chest pain admission Nov 2013. Work up to date is reassuring. - Troponins neg x2 - ECG  without concern for ischemia - Monitor on telemetry   # CAD, HTN, Hypercholesterolemia:  - History of viral myocarditis in 1990 and HFrEF, EF 30-35% on echo Nov 2012; improved to 60-65% with no mention of diastolic dysfunction on most recent echo (June 2013). [facility] BNP elevation due to decreased clearance.  - CXR with subsegmental atelectasis and no signs of volume overload.  - Continue home plavix, which was started during admission Nov 2013 after episode of chest pain. Cath deferred at that time due to CKD. Has documented anaphylactic reaction to ASA.  - Holding home toprol-XL 225mdue to hypotension. 98/56 this AM - Not on ACE inhibitor due to CKD  - Continue statin. Most recent lipid profile: LDL 76, HDL 38, total 142, trigs 139.   # COPD  - No home medications listed, no oxygen requirement at this time.  - Respiratory compensation for metabolic acidosis is appropriate (CO2 21; between 20-24)   # History of anemia of chronic disease  - Precipitous drop in platelets and hgb due to hemodilution, no s/sxs bleeding. Will recheck tomorrow  - Last iron studies were normal in Feb 2013. Will defer further work up to PCP.   # Vitamin D deficiency  - No home supplementation noted in EMR, last checked May 2013 and was 17. Defer further management to PCP   # Tobacco abuse  - Cessation counseling prior to discharge.   FEN/GI: D5W @300cc /hr, diet Prophylaxis: Subcutaneous heparin  Disposition: Continue management in SDU  Subjective: Pt denies nausea and emesis at this time. Still with high volume, clear ostomy output. No blood. No SOB/CP at this time.  Objective: Temp:  [97.5 F (36.4 C)-99 F (37.2 C)] 98.7 F (37.1 C) (11/11 0420) Pulse Rate:  [87-110] 99 (11/11 0500) Resp:  [16-29] 16 (11/11 0500) BP: (78-117)/(46-95) 98/56 mmHg (11/11 0500) SpO2:  [95 %-100 %] 98 % (11/11 0500) Weight:  [136 lb 0.4 oz (61.7 kg)-136 lb 6 oz (61.859 kg)] 136 lb 0.4 oz  (61.7 kg) (11/11 0420) Physical Exam: General: ill-appearing 62 yo female in NAD Cardiovascular: RRR, no M/R/G, No JVD Respiratory: Non-labored on room air, no wheezes or crackles Abdomen: Hyperactive BS, soft, very mildly tender to palpation generally, ostomy site without erythema, bag with yellow/clear output.  Extremities: Warm, good strength, no edema.   Laboratory:  Recent Labs Lab 05/15/13 1811 05/16/13 0245  WBC 9.5 9.1  HGB 14.8 10.6*  HCT 43.9 31.7*  PLT 232 143*    Recent Labs Lab 05/15/13 1811 05/15/13 2032 05/16/13 0245  NA 130*  --  136  K 6.1* 5.9* 4.0  CL 97  --  107  CO2 8*  --  11*  BUN 50*  --  51*  CREATININE 6.46*  --  5.30*  CALCIUM 9.9  --  7.3*  PROT 10.5*  --   --   BILITOT 0.4  --   --   ALKPHOS 209*  --   --   ALT 49*  --   --   AST 40*  --   --   GLUCOSE 92  --  106*   CT ABDOMEN/PELVIS  FINDINGS:  Visualized lung bases appear normal. These unenhanced images  demonstrate no focal abnormality involving the liver, spleen or  pancreas. Status post cholecystectomy. Adrenal glands appear normal.  Atherosclerotic calcifications of abdominal aorta are noted without  aneurysm formation. No hydronephrosis or renal obstruction is noted.  No renal or ureteral calculi are noted. Status post colectomy with  ostomy seen in left lower quadrant. Small bowel is again noted in  the associated hernia without evidence of bowel obstruction or  ileus. No definite inflammatory changes are noted. No abnormal fluid  collection is noted. Urinary bladder is decompressed. Uterus appears  normal.  IMPRESSION:  Stable postsurgical changes compared to prior exam. No acute  abnormality seen and abdomen or pelvis.  CXR  FINDINGS:  The heart size and mediastinal contours are within normal limits.  Right lung is clear. Curvilinear density is seen in lingular region  consistent with scarring or subsegmental atelectasis. No  pneumothorax or pleural effusion is noted.  The visualized skeletal  structures are unremarkable.  IMPRESSION:  Curvilinear lingular density consistent with scarring or  subsegmental atelectasis.  ECG  Sinus tachycardia with normal axis, no conduction delay, no evidence of ischemia, and no peaked T waves.    Vance Gather, MD 05/16/2013, 8:17 AM PGY-1, [place] Intern pager: [phone], text pages welcome

## 2013-05-16 NOTE — Progress Notes (Signed)
CRITICAL VALUE ALERT  Critical value received:  K 2.7   Date of notification:  05-16-13  Time of notification:  1211  Critical value read back:yes  Nurse who received alert:  Joseph Art, RN   MD notified (1st page):  Family Medicine Pager, 1st Page Intern  Time of first page:  1211  MD notified (2nd page):  Time of second page:  Responding MD:  Phill Myron  Time MD responded:  1228

## 2013-05-16 NOTE — ED Provider Notes (Signed)
CSN: [id number]     Arrival date & time 05/15/13  1735 History   First MD Initiated Contact with Patient 05/15/13 1840     Chief Complaint  Patient presents with  . Emesis  . Shortness of Breath  . Abdominal Pain  . Chest Pain   (Consider location/radiation/quality/duration/timing/severity/associated sxs/prior Treatment) The history is provided by the patient. No language interpreter was used.   Patient is a 62 year old African American female with past medical history Crohn's disease who comes emergency department today with vomiting and diarrhea for the past 2 days. She states that she feels that she is likely dehydrated as a result she came in. She's been vomiting and innumerable times daily. She has a history of colectomy is result of her Crohn's disease. She has colostomy she states it has so much output that she has to constantly drain the bag. She reports abdominal pain which rates at a 3/10. He is not had any fevers, chills, dysuria, frequency, urgency, shortness of breath, cough, or dizziness.  Past Medical History  Diagnosis Date  . Crohn's disease   . Hypertension   . Hyperlipidemia   . CHF (congestive heart failure)     EF 30-35% 2012->EF 60-65% 2013  . GERD (gastroesophageal reflux disease)   . 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 Anxiety   . Panic attacks   . CKD (chronic kidney disease), stage III   . Anemia, chronic renal failure   . Asthma 05/2011  . Headache(784.0)     "related to high BP" (05/30/2012)  . Tobacco abuse   . Abnormal chest CT     Coronary atherosclerosis on chest CT 2012   Past Surgical History  Procedure Laterality Date  . 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 Ectopic pregnancy surgery  ?1980's    left  . Cholecystectomy  01/28/2005  . Cataract extraction w/ intraocular lens  implant, bilateral  ~ 2000  . Ileostomy  ?  2002   Family History  Problem Relation Age of Onset  . Heart disease Mother   . Stroke Father    History  Substance  Use Topics  . Smoking status: Current Every Day Smoker -- 0.12 packs/day for 18 years    Types: Cigarettes  . Smokeless tobacco: Never Used     Comment: USES E CIGARETTE  . Alcohol Use: No     Comment: 05/30/2012 "used to drink back in the day; last alcohol 23 yr ago"   OB History   Grav Para Term Preterm Abortions TAB SAB Ect Mult Living                 Review of Systems  Constitutional: Negative for fever, chills and diaphoresis.  Respiratory: Negative for cough and shortness of breath.   Gastrointestinal: Positive for nausea, vomiting, abdominal pain and diarrhea. Negative for constipation.  Genitourinary: Negative for dysuria, urgency and frequency.  Musculoskeletal: Negative for neck pain.  Skin: Negative for color change and wound.  All other systems reviewed and are negative.    Allergies  Amoxicillin; Aspirin; Morphine and related; Penicillins; Ace inhibitors; and Gabapentin  Home Medications   Current Outpatient Rx  Name  Route  Sig  Dispense  Refill  . acetaminophen (TYLENOL) 500 MG tablet   Oral   Take 1,000 mg by mouth every 6 (six) hours as needed for pain.         . calcium carbonate (TUMS - DOSED IN MG ELEMENTAL CALCIUM) 500 MG chewable tablet  Oral   Chew 1 tablet by mouth daily.         Marland Kitchen LORazepam (ATIVAN) 0.5 MG tablet   Oral   Take 0.5 mg by mouth 2 (two) times daily as needed for anxiety.         . Mesalamine 800 MG TBEC   Oral   Take 1 tablet (800 mg total) by mouth 3 (three) times daily.   180 tablet   5   . metoprolol succinate (TOPROL-XL) 50 MG 24 hr tablet   Oral   Take 25 mg by mouth daily. Take with or immediately following a meal.         . simvastatin (ZOCOR) 20 MG tablet   Oral   Take 1 tablet (20 mg total) by mouth at bedtime.   90 tablet   3   . triamcinolone ointment (KENALOG) 0.5 %   Topical   Apply topically 2 (two) times daily.   30 g   3   . clopidogrel (PLAVIX) 75 MG tablet   Oral   Take 1 tablet (75 mg  total) by mouth daily with breakfast.   30 tablet   6    BP 220/120  Pulse 106  Temp(Src) 99 F (37.2 C) (Oral)  Resp 17  Wt 136 lb 6 oz (61.859 kg)  SpO2 100% Physical Exam  Nursing note and vitals reviewed. Constitutional: She is oriented to person, place, and time. She appears well-developed and well-nourished. No distress.  HENT:  Head: Normocephalic and atraumatic.  Eyes: Pupils are equal, round, and reactive to light.  Neck: Normal range of motion.  Cardiovascular: Normal rate, regular rhythm, normal heart sounds and intact distal pulses.   Pulmonary/Chest: Effort normal. No respiratory distress. She has no wheezes. She has no rales. She exhibits no tenderness.  Abdominal: Soft. Bowel sounds are normal. She exhibits no distension. There is tenderness. There is no rigidity, no rebound and no guarding.  Neurological: She is alert and oriented to person, place, and time. She has normal strength. No cranial nerve deficit or sensory deficit. She exhibits normal muscle tone. Coordination and gait normal.  Skin: Skin is warm and dry.    ED Course  Procedures (including critical care time) Labs Review Labs Reviewed  PRO B NATRIURETIC PEPTIDE - Abnormal; Notable for the following:    Pro B Natriuretic peptide (BNP) 375.3 (*)    All other components within normal limits  CBC WITH DIFFERENTIAL - Abnormal; Notable for the following:    Monocytes Relative 13 (*)    Monocytes Absolute 1.2 (*)    All other components within normal limits  COMPREHENSIVE METABOLIC PANEL - Abnormal; Notable for the following:    Sodium 130 (*)    Potassium 6.1 (*)    CO2 8 (*)    BUN 50 (*)    Creatinine, Ser 6.46 (*)    Total Protein 10.5 (*)    AST 40 (*)    ALT 49 (*)    Alkaline Phosphatase 209 (*)    GFR calc non Af Amer 6 (*)    GFR calc Af Amer 7 (*)    All other components within normal limits  LIPASE, BLOOD - Abnormal; Notable for the following:    Lipase 116 (*)    All other  components within normal limits  POTASSIUM - Abnormal; Notable for the following:    Potassium 5.9 (*)    All other components within normal limits  POCT I-STAT 3, BLOOD GAS (G3+) -  Abnormal; Notable for the following:    pH, Arterial 7.089 (*)    pCO2 arterial 21.0 (*)    pO2, Arterial 103.0 (*)    Bicarbonate 6.4 (*)    Acid-base deficit 22.0 (*)    All other components within normal limits  URINALYSIS, ROUTINE W REFLEX MICROSCOPIC  BLOOD GAS, ARTERIAL  POCT I-STAT TROPONIN I  CG4 I-STAT (LACTIC ACID)   Imaging Review Ct Abdomen Pelvis Wo Contrast  05/15/2013   CLINICAL DATA:  Abdominal pain, emesis.  EXAM: CT ABDOMEN AND PELVIS WITHOUT CONTRAST  TECHNIQUE: Multidetector CT imaging of the abdomen and pelvis was performed following the standard protocol without intravenous contrast.  COMPARISON:  CT scan of August 27, 2011.  FINDINGS: Visualized lung bases appear normal. These unenhanced images demonstrate no focal abnormality involving the liver, spleen or pancreas. Status post cholecystectomy. Adrenal glands appear normal. Atherosclerotic calcifications of abdominal aorta are noted without aneurysm formation. No hydronephrosis or renal obstruction is noted. No renal or ureteral calculi are noted. Status post colectomy with ostomy seen in left lower quadrant. Small bowel is again noted in the associated hernia without evidence of bowel obstruction or ileus. No definite inflammatory changes are noted. No abnormal fluid collection is noted. Urinary bladder is decompressed. Uterus appears normal.  IMPRESSION: Stable postsurgical changes compared to prior exam. No acute abnormality seen and abdomen or pelvis.   Electronically Signed   By: Sabino Dick [name]   On: 05/15/2013 20:07   Dg Chest 2 View  05/15/2013   CLINICAL DATA:  Shortness of breath, chest pain.  EXAM: CHEST  2 VIEW  COMPARISON:  September 18, 2012.  FINDINGS: The heart size and mediastinal contours are within normal limits. Right  lung is clear. Curvilinear density is seen in lingular region consistent with scarring or subsegmental atelectasis. No pneumothorax or pleural effusion is noted. The visualized skeletal structures are unremarkable.  IMPRESSION: Curvilinear lingular density consistent with scarring or subsegmental atelectasis.   Electronically Signed   By: Sabino Dick [name]   On: 05/15/2013 19:38    EKG Interpretation     Ventricular Rate:  107 PR Interval:  152 QRS Duration: 90 QT Interval:  350 QTC Calculation: 467 R Axis:   51 Text Interpretation:  Sinus tachycardia Biatrial enlargement Nonspecific T wave abnormality Abnormal ECG            MDM  Patient is a 62 year old African American female with past medical history of Crohn's disease who comes emergency department today with abdominal pain, vomiting, and diarrhea. Physical exam as above. Initial presentation patient was hypertensive and tachycardic. She has significant abdominal pain. Her pain was treated with a milligram of Dilaudid. This does appear to be dehydrated. She was treated with a liter of normal saline and 4 mg of Zofran. Initial workup included EKG, chest x-ray, CT of the abdomen, CMP, CBC, BMP, troponin, lipase, and lactic acid. Chest x-ray was unremarkable. EKG as above. CT of the abdomen demonstrated no acute findings. CBC and a C. of 11.5 otherwise unremarkable. Troponin was negative. Lipase 116. Lactic acid is 1.08. CMP had potassium of 6.1 with pulses. There is no evidence of hyperkalemia on EKG results was felt to be due to hemolysis the patient is not felt to require treatment for at this time. Repeat potassium was obtained. CMP also had a creatinine of 6.46 elevated from patient's baseline of 2. CO2 is low at 8. An ABG was obtained. Patient continued to be hemodynamically stable in the emergency  department is result her felt to require admission to the family medicine team for acute kidney injury and dehydration. Family medicine is  vitamins patient emergency department she became hypotensive. With blood pressures with SBP in the 70s. The request of the patient's care be discussed with critical care medicine prior to admission. ABG demonstrated a pH of 7.089, PCO2 of 21, PO2 of 103, bicarbonate of 6.4. Repeat potassium did be elevated 5.9. With no EKG changes patient is to require treatment for this time. Patient's blood pressure was treated with 2 L normal saline and improved with blood pressures in the 90s. Critical care did not feel that the patient required ICU level care this time. With hypotension and tachycardia patient was treated with a gram of Rocephin. Urinalysis was ordered. She was missed the step down unit the family medicine service in stable condition. Labs and imaging reviewed by myself and considered and medical decision-making. Imaging was interpreted radiology. Care was discussed with my attending Dr. Reather Converse.    1. Hypovolemic shock   2. Acute kidney failure   3. Metabolic acidosis   4. Vomiting   5. Diarrhea   6. Abdominal pain       Katheren Shams, MD 05/16/13 0030

## 2013-05-16 NOTE — Progress Notes (Signed)
INITIAL NUTRITION ASSESSMENT  DOCUMENTATION CODES Per approved criteria  -Not Applicable   INTERVENTION: Advance diet as medically appropriate  RD to follow for nutrition care plan, add interventions accordingly  NUTRITION DIAGNOSIS: Inadequate oral intake related to inability to eat as evidenced by NPO status  Goal: Pt to meet >/= 90% of their estimated nutrition needs   Monitor:  PO diet advancement & intake, weight, labs, I/O's   Reason for Assessment: Malnutrition Screening Tool Report  62 y.o. female  Admitting Dx: weakness, high colostomy output  ASSESSMENT: Patient with PMH of Crohn's disease, CKD, CHF and surgical resection presented with nausea, vomiting, abdominal pain and increased colostomy output; found to have high anion gap metabolic acidosis.  Patient reports a poor appetite for 2 days PTA; per weight records no significant weight loss recently; has been seen per Nutritional Management with previous hospitalization (August 2014) -- patient with hx of poor PO intake & GI distress; CWOCN note from today reviewed; patient likes Ensure Complete supplements -- RD to order when/as able.  Height: Ht Readings from Last 1 Encounters:  05/03/13 5' 6"  (1.676 m)    Weight: Wt Readings from Last 1 Encounters:  05/16/13 136 lb 0.4 oz (61.7 kg)    Ideal Body Weight: 130 lb  % Ideal Body Weight: 100%  Wt Readings from Last 10 Encounters:  05/16/13 136 lb 0.4 oz (61.7 kg)  05/03/13 141 lb 1.6 oz (64.003 kg)  02/22/13 138 lb 6.4 oz (62.778 kg)  02/12/13 145 lb 11.2 oz (66.089 kg)  02/09/13 134 lb (60.782 kg)  02/08/13 135 lb (61.236 kg)  12/16/12 144 lb (65.318 kg)  10/27/12 144 lb (65.318 kg)  10/18/12 144 lb 12.8 oz (65.681 kg)  10/12/12 145 lb (65.772 kg)    Usual Body Weight: 141 lb  % Usual Body Weight: 96%  BMI:  Body mass index is 21.97 kg/(m^2).  Estimated Nutritional Needs: Kcal: 1600-1800 Protein: 75-85 gm Fluid: per MD  Skin: colostomy  LLQ  Diet Order: NPO  EDUCATION NEEDS: -No education needs identified at this time   Intake/Output Summary (Last 24 hours) at 05/16/13 1122 Last data filed at 05/16/13 0955  Gross per 24 hour  Intake   4850 ml  Output    353 ml  Net   4497 ml    Labs:   Recent Labs Lab 05/15/13 1811 05/15/13 2032 05/16/13 0245  NA 130*  --  136  K 6.1* 5.9* 4.0  CL 97  --  107  CO2 8*  --  11*  BUN 50*  --  51*  CREATININE 6.46*  --  5.30*  CALCIUM 9.9  --  7.3*  GLUCOSE 92  --  106*    Scheduled Meds: . clopidogrel  75 mg Oral Q breakfast  . heparin  5,000 Units Subcutaneous Q8H  .  HYDROmorphone (DILAUDID) injection  1 mg Intravenous Once  . Mesalamine  800 mg Oral TID  . simvastatin  20 mg Oral QHS  . sodium chloride  3 mL Intravenous Q12H    Continuous Infusions: .  sodium bicarbonate  infusion 1000 mL 300 mL/hr at 05/16/13 0901    Past Medical History  Diagnosis Date  . Crohn's disease   . Hypertension   . Hyperlipidemia   . CHF (congestive heart failure)     EF 30-35% 2012->EF 60-65% 2013  . GERD (gastroesophageal reflux disease)   . 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 Anxiety   . Panic attacks   .  CKD (chronic kidney disease), stage III   . Anemia, chronic renal failure   . Asthma 05/2011  . Headache(784.0)     "related to high BP" (05/30/2012)  . Tobacco abuse   . Abnormal chest CT     Coronary atherosclerosis on chest CT 2012    Past Surgical History  Procedure Laterality Date  . 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 Ectopic pregnancy surgery  ?1980's    left  . Cholecystectomy  01/28/2005  . Cataract extraction w/ intraocular lens  implant, bilateral  ~ 2000  . Ileostomy  ?  2002    Arthur Holms, RD, LDN Pager #: [phone] After-Hours Pager #: [phone]

## 2013-05-16 NOTE — H&P (Signed)
Family Medicine Teaching Service Attending Note  I interviewed and examined patient [name] [name] and reviewed their tests and x-rays.  I discussed with Dr. Thomes Dinning and reviewed their note for today.  I agree with their assessment and plan.     Additionally  Alert no acute distress feeling a little better - no vomiting this AM.  Ostomy output decreased.  No abdomen pain Abdomen - nontender no distension  Presentation most consistent with hypovolemic shock due to dehydration due to GI losses due to presumed viral infection.    Replace fluids - bicarb as per renal but unsure if completely necessary Monitor CO2 and Crt and Urop If these do not correct promptly would formally consult renal and do a thorough review for other causes If they do would stop bicarb Monitor for signs of CHF but last echo was reassuring

## 2013-05-17 ENCOUNTER — Other Ambulatory Visit: Payer: Self-pay

## 2013-05-17 DIAGNOSIS — N189 Chronic kidney disease, unspecified: Secondary | ICD-10-CM

## 2013-05-17 DIAGNOSIS — D631 Anemia in chronic kidney disease: Secondary | ICD-10-CM

## 2013-05-17 DIAGNOSIS — E874 Mixed disorder of acid-base balance: Secondary | ICD-10-CM

## 2013-05-17 DIAGNOSIS — E876 Hypokalemia: Secondary | ICD-10-CM

## 2013-05-17 LAB — BLOOD GAS, ARTERIAL
Acid-Base Excess: 14.6 mmol/L — ABNORMAL HIGH (ref 0.0–2.0)
Bicarbonate: 37.8 mEq/L — ABNORMAL HIGH (ref 20.0–24.0)
Drawn by: 36527
O2 Saturation: 95.6 %
Patient temperature: 98.6
pCO2 arterial: 35.4 mmHg (ref 35.0–45.0)
pO2, Arterial: 63.6 mmHg — ABNORMAL LOW (ref 80.0–100.0)

## 2013-05-17 LAB — BASIC METABOLIC PANEL
BUN: 25 mg/dL — ABNORMAL HIGH (ref 6–23)
BUN: 21 mg/dL (ref 6–23)
CO2: 39 mEq/L — ABNORMAL HIGH (ref 19–32)
Calcium: 6.9 mg/dL — ABNORMAL LOW (ref 8.4–10.5)
Creatinine, Ser: 2.18 mg/dL — ABNORMAL HIGH (ref 0.50–1.10)
GFR calc Af Amer: 27 mL/min — ABNORMAL LOW (ref >90)
GFR calc Af Amer: 30 mL/min — ABNORMAL LOW (ref >90)
GFR calc non Af Amer: 26 mL/min — ABNORMAL LOW (ref >90)
Glucose, Bld: 106 mg/dL — ABNORMAL HIGH (ref 70–99)
Glucose, Bld: 131 mg/dL — ABNORMAL HIGH (ref 70–99)
Potassium: 2.9 mEq/L — ABNORMAL LOW (ref 3.5–5.1)
Potassium: 3.1 mEq/L — ABNORMAL LOW (ref 3.5–5.1)
Sodium: 142 mEq/L (ref 135–145)

## 2013-05-17 LAB — URINE CULTURE

## 2013-05-17 LAB — CBC
HCT: 29.6 % — ABNORMAL LOW (ref 36.0–46.0)
Hemoglobin: 10 g/dL — ABNORMAL LOW (ref 12.0–15.0)
MCH: 31 pg (ref 26.0–34.0)
MCV: 91.6 fL (ref 78.0–100.0)
RBC: 3.23 MIL/uL — ABNORMAL LOW (ref 3.87–5.11)

## 2013-05-17 LAB — TROPONIN I: Troponin I: <0.3 ng/mL (ref <0.30)

## 2013-05-17 MED ORDER — POTASSIUM CHLORIDE CRYS ER 20 MEQ PO TBCR
40.0000 meq | EXTENDED_RELEASE_TABLET | Freq: Three times a day (TID) | ORAL | Status: AC
  Administered 2013-05-17 – 2013-05-18 (×3): 40 meq via ORAL
  Filled 2013-05-17 (×3): qty 2

## 2013-05-17 MED ORDER — LORAZEPAM 0.5 MG PO TABS
0.5000 mg | ORAL_TABLET | Freq: Every evening | ORAL | Status: DC | PRN
Start: 2013-05-17 — End: 2013-05-20
  Administered 2013-05-17 – 2013-05-19 (×4): 0.5 mg via ORAL
  Filled 2013-05-17 (×4): qty 1

## 2013-05-17 MED ORDER — ACETAMINOPHEN 325 MG PO TABS
650.0000 mg | ORAL_TABLET | Freq: Four times a day (QID) | ORAL | Status: DC | PRN
  Administered 2013-05-20: 650 mg via ORAL
  Filled 2013-05-17 (×2): qty 2

## 2013-05-17 MED ORDER — SODIUM CHLORIDE 0.9 % IV SOLN
1.0000 g | Freq: Once | INTRAVENOUS | Status: AC
  Administered 2013-05-17: 1 g via INTRAVENOUS
  Filled 2013-05-17: qty 10

## 2013-05-17 MED ORDER — MAGNESIUM SULFATE 40 MG/ML IJ SOLN
2.0000 g | Freq: Once | INTRAMUSCULAR | Status: AC
  Administered 2013-05-17: 2 g via INTRAVENOUS
  Filled 2013-05-17 (×2): qty 50

## 2013-05-17 NOTE — Telephone Encounter (Signed)
Patient in hospital with Dehydration 2nd to GI losses and acute on chronic renal failure.  Informed of abnormal pap.  Still waiting to here from consultant about best approach.

## 2013-05-17 NOTE — Progress Notes (Signed)
Attending Addendum  I examined the patient and discussed the assessment and plan with Dr. Bonner Puna and Otis Dials. I have reviewed the note and agree.  Patient with tetany and hand and face numbness this AM. Reported hand and wrist numbness when BP cuff inflated.  noted to have hypocalcemia, hypomagnesemia, hypokalemia and metabolic alkalosis in the setting of bicarb infusion.  O: Gen: agitated, visibly upset  Chest: S1 S2, RRR, CTA b/k Abd: pink ostomy with minimal stool in bag  Neuro: 3+ PT reflexes.  No focal neurological deficits.   Bicarb gtt d/cd Patient given IV calcium gluconate and magnesium sulfate EKG showed prolonged QTc Repeat BMP pending.  Additional KDUR ordered, 40 meQ PO q 4 hrs x 3 doses Patient still have K+ in fluids.     [name] [name], [place]

## 2013-05-17 NOTE — Progress Notes (Signed)
ABG panic value reported to RN.

## 2013-05-17 NOTE — Progress Notes (Addendum)
  CRITICAL VALUE ALERT  Critical value received:  K 2.7  Date of notification:  05/16/13   Time of notification:  1930  Critical value read back:yes  Nurse who received alert:  Vassie Moment RN  MD notified (1st page):  Leeanne Rio, MD  Time of first page:  1932  MD notified (2nd page):  Time of second page:  Responding MD:    Time MD responded:

## 2013-05-17 NOTE — Progress Notes (Signed)
CRITICAL VALUE ALERT  Critical value received:  Blood gas: pH= 7.63, pO2: 63.6, Bicarb: 37.8, Acid-Base excess: 14.6  Date of notification:  05/17/2013   Time of notification:   1031   Critical value read back:yes  Nurse who received alert:  Ernestyne Caldwell, RN   MD notified (1st page):   Net[place]  Time of first page:  1036  MD notified (2nd page):  Time of second page:  Responding MD:  Teryl Lucy   Time MD responded:  3838

## 2013-05-17 NOTE — Discharge Summary (Signed)
[facility] Discharge Summary  Patient name: [name] [name] Medical record number: [id number] Date of birth: [birth year] Age: 62 y.o. Gender: female Date of Admission: 05/15/2013  Date of Discharge: 05/20/13 Admitting Physician: Lind Covert, MD  Primary Care Provider: Zigmund Gottron, MD Consultants: Nephrology  Indication for Hospitalization: High volume ostomy output, acidosis  Discharge Diagnoses/Problem List:  Acute renal failure - improved Crohn's disease s/p Hartmann's and colostomy Anion gap metabolic acidosis - resolved Hypophosphatemia - resolved Hyperkalemia - resolved Hypomagnesemia Systolic CHF Anxiety CAD Tobacco abuse COPD Anemia of chronic disease Vitamin D deficiency CKD, stage III HTN Hyperlipidemia  Disposition: discharge home  Discharge Condition: stable  Brief Hospital Course:  [name] [name] is a 62 y.o. female presenting on 11/12 with vomiting and increased colostomy output found to have high anion gap metabolic acidosis and acute renal failure. PMH is significant for crohn's disease s/p colostomy, HTN, hyperlipidemia, and stage III CKD.   On arrival, she was in acute on chronic renal failure due to extreme dehydration and acidosis due mostly to GI loss of bicarbonate. pH on ABG was 7.089, anion gap 25, and creatinine  6.46 (baseline 2.0). Lipase and BNP were also moderately elevated without signs or symptoms of pancreatitis or volume overload, due to decreased renal clearance. Aggressive hydration with IV fluids and bicarbonate was started per nephrology recommendation and she was transferred to the step-down unit. Renal function and metabolic derangements improved with IV and then PO hydration as well as supplementation of electrolytes. Please see below for details by problem list.   # High ostomy output Gastroenteritis vs crohn's flare, improving with IV and now PO rehydration  - CT abdomen/pelvis unremarkable with  no acute findings suggestive of etiology.  - remains without leukocytosis or fever and benign abdominal exam  - Continue continue home mesalamine.  [ ]  consider another small fluid bolus for mild elevated heart rate, vs watch with PO intake   # Acute renal failure on CKD stage III.  - Creatinine (baseline ~2.0) 6.46 on arrival, improved to 1.8   # Hypophosphatemia:  - improved s/p repletion   # Hypocalcemia:  - trending improved, previously on TUMS, changing to oral Ca Citrate 415m BID at discharge   # Vitamin D deficiency  - Vit D: 11 intially  - started qWeekly vitamin D to continue for 8 weeks.   # Hypomagnesemia  - repleting, plan to continue PO at discharge   # Hyperkalemia  - intermittent, now improved with stabilizing ostomy output, improved kidney function   # Anxiety:  - Restart home Ativan at discharge  - ECG nonischemic, troponin negative   # Chest pain Non-anginal.  - Troponins neg x3  - ECG without concern for ischemia this AM   # CAD, HTN, Hypercholesterolemia:  - History of viral myocarditis in 1990 and HFrEF, EF 30-35% on echo Nov 2012; improved to 60-65% with no mention of diastolic dysfunction on most recent echo (June 2013), [facility] with subsegmental atelectasis and no signs of volume overload.  - Continued home Plavix, which was started during admission Nov 2013 after episode of chest pain. Has documented anaphylactic reaction to ASA.  - Continued home Toprol-XL 22mafter original hypotension (not on ACE inhibitor due to CKD) - Continued statin. Most recent lipid profile: LDL 76, HDL 38, total 142, trigs 139.   # COPD  - No home medications listed, no oxygen requirement at this time.   # History of anemia of  chronic disease  - Hemoglobin stable > 10   # Tobacco abuse  - Cessation counseling prior to discharge.   Issues for Follow Up:  1. Ostomy output / metabolic derangement / acute on chronic kidney injury - Please evaluate  for return to baseline output. Consider BMP or CMP plus phosphate and magnesium labs at f/u to evaluate electrolytes (discharged with Ca and Mg supplementation). 2. Anemia: Hb stable this admission, but last iron studies were normal in Feb 2013. Consider repeat CBC and/or iron studies.  3. Vit D deficiency - no previous supplementation noted in EMR. Started on Vitamin D qWeek for planned 8 weeks. Consider recheck at that time 4. Reinforce tobacco cessation counseling received prior to discharge.  5. Chronic illnesses - otherwise continued home medications without change. F/u as needed.  Significant Procedures: none  Significant Labs and Imaging:   Recent Labs Lab 05/16/13 0245 05/17/13 0920 05/19/13 0125  WBC 9.1 6.4 8.2  HGB 10.6* 10.0* 10.3*  HCT 31.7* 29.6* 31.7*  PLT 143* 137* 145*    Recent Labs Lab 05/15/13 1811  05/17/13 0920 05/17/13 1200 05/17/13 1405 05/18/13 0342 05/18/13 1942 05/19/13 0125 05/19/13 1616 05/20/13 0533  NA 130*  < > 144 142  --  140 136 135 138 139  K 6.1*  < > 2.9* 3.1*  --  4.3 6.0* 5.8* 4.7 5.0  CL 97  < > 95* 94*  --  102 102 101 102 104  CO2 8*  < > 39* 35*  --  30 27 24 21 23   GLUCOSE 92  < > 106* 131*  --  97 98 100* 111* 92  BUN 50*  < > 25* 21  --  15 15 15 14 15   CREATININE 6.46*  < > 2.18* 2.00*  --  1.76* 1.74* 1.72* 1.77* 1.86*  CALCIUM 9.9  < > 6.9* 7.6* 7.4* 7.4* 8.2* 8.9 8.3* 8.8  MG  --   --  1.3*  --  2.0  --   --   --  1.4* 1.4*  PHOS  --   --   --  0.6*  --   --  0.8* 1.4* 5.2* 3.8  ALKPHOS 209*  --   --   --   --   --   --   --   --   --   AST 40*  --   --   --   --   --   --   --   --   --   ALT 49*  --   --   --   --   --   --   --   --   --   ALBUMIN 4.6  --   --   --   --   --   --   --   --   --   < > = values in this interval not displayed.  CT ABDOMEN/PELVIS  FINDINGS:  Visualized lung bases appear normal. These unenhanced images  demonstrate no focal abnormality involving the liver, spleen or  pancreas. Status  post cholecystectomy. Adrenal glands appear normal.  Atherosclerotic calcifications of abdominal aorta are noted without  aneurysm formation. No hydronephrosis or renal obstruction is noted.  No renal or ureteral calculi are noted. Status post colectomy with  ostomy seen in left lower quadrant. Small bowel is again noted in  the associated hernia without evidence of bowel obstruction or  ileus. No definite inflammatory changes are  noted. No abnormal fluid  collection is noted. Urinary bladder is decompressed. Uterus appears  normal.  IMPRESSION:  Stable postsurgical changes compared to prior exam. No acute  abnormality seen and abdomen or pelvis.   CXR  FINDINGS:  The heart size and mediastinal contours are within normal limits.  Right lung is clear. Curvilinear density is seen in lingular region  consistent with scarring or subsegmental atelectasis. No  pneumothorax or pleural effusion is noted. The visualized skeletal  structures are unremarkable.  IMPRESSION:  Curvilinear lingular density consistent with scarring or  subsegmental atelectasis.   ECG 11/11  Sinus tachycardia with normal axis, no conduction delay, no evidence of ischemia, and no peaked T waves. QTc 479   ECG 11/12  Normal sinus rhythm with rate of 100, normal axis, no ST segment changes. Prolonged QTc  Results/Tests Pending at Time of Discharge: none  Discharge Medications:    Medication List    STOP taking these medications       calcium carbonate 500 MG chewable tablet  Commonly known as:  TUMS - dosed in mg elemental calcium      TAKE these medications       acetaminophen 500 MG tablet  Commonly known as:  TYLENOL  Take 1,000 mg by mouth every 6 (six) hours as needed for pain.     calcium citrate 950 MG tablet  Commonly known as:  CALCITRATE - dosed in mg elemental calcium  Take 1 tablet (200 mg of elemental calcium total) by mouth 2 (two) times daily.     clopidogrel 75 MG tablet  Commonly  known as:  PLAVIX  Take 1 tablet (75 mg total) by mouth daily with breakfast.     LORazepam 0.5 MG tablet  Commonly known as:  ATIVAN  Take 0.5 mg by mouth 2 (two) times daily as needed for anxiety.     magnesium oxide 400 (241.3 MG) MG tablet  Commonly known as:  MAG-OX  Take 1 tablet (400 mg total) by mouth daily.     Mesalamine 800 MG Tbec  Take 1 tablet (800 mg total) by mouth 3 (three) times daily.     metoprolol succinate 50 MG 24 hr tablet  Commonly known as:  TOPROL-XL  Take 25 mg by mouth daily. Take with or immediately following a meal.     simvastatin 20 MG tablet  Commonly known as:  ZOCOR  Take 1 tablet (20 mg total) by mouth at bedtime.     triamcinolone ointment 0.5 %  Commonly known as:  KENALOG  Apply topically 2 (two) times daily.     Vitamin D (Ergocalciferol) 50000 UNITS Caps capsule  Commonly known as:  DRISDOL  Take 1 capsule (50,000 Units total) by mouth every 7 (seven) days.        Discharge Instructions: Please refer to Patient Instructions section of EMR for full details.  Patient was counseled important signs and symptoms that should prompt return to medical care, changes in medications, dietary instructions, activity restrictions, and follow up appointments.   Follow-Up Appointments: Follow-up Information   Follow up with Zigmund Gottron, MD. Schedule an appointment as soon as possible for a visit in 1 week. (Call on 11/17 to make an appointment with Dr. Andria Frames in about 1 week.)    Specialty:  Family Medicine   Contact information:   [place] [address] [place], MD 05/20/2013, 9:47 AM PGY-2, [place]

## 2013-05-17 NOTE — Progress Notes (Signed)
FPTS Interim Note: Called by RN for concern of drowsiness quickly followed by anxiety and panic attack like symptoms.  Patient states that she feels "like something is wrong". She reports dizziness and some blurred vision. She reports feeling anxious. She denies any chest pain or dyspnea. She denies any focal weakness.  On exam, she appears very anxious.  Neuro: CN2-12 grossly intact, 4/5 grip strength bilaterally, fine fascicullations of eyelids present, negative Chvostek's sign Alert and oriented x3 CV: S1S2, tachycardic, normal rhythm Pulm: CTA b/l on anterior exam  BMET    Component Value Date/Time   NA 140 05/16/2013 1825   K 2.7* 05/16/2013 1825   CL 101 05/16/2013 1825   CO2 28 05/16/2013 1825   GLUCOSE 120* 05/16/2013 1825   BUN 40* 05/16/2013 1825   CREATININE 3.42* 05/16/2013 1825   CREATININE 1.91* 10/12/2012 1436   CALCIUM 6.9* 05/16/2013 1825   GFRNONAA 14* 05/16/2013 1825   GFRAA 16* 05/16/2013 1825    A/P: 62 yo female with h/o Crohns and anxiety with episode of anxiety.  Patient is hypocalcemic. This could be contributing to her sense of anxiety and her fasciculations.  # Hypocalcemia:  - EKG to rule out long QT - check PTH/vitD - IV calcium gluconate 1gm for now and recheck Ca - check ABG to rule out respiratory alkalosis  # Anxiety: hypocalcemia vs anxiety vs cardiac event - check troponin - correct hypoCa - hold on ativan for now  # HypoK: - recheck K since she was repleted - check Mg  Her daughter was updated by phone.  Follow her closely  Liam Graham, PGY-3 Family Medicine Resident

## 2013-05-17 NOTE — Progress Notes (Signed)
Utilization Review Completed.  

## 2013-05-17 NOTE — Progress Notes (Signed)
Family Medicine Teaching Service Daily Progress Note Intern Pager: [phone]  Patient name: [name] [name] Medical record number: [id number] Date of birth: March 09, 1951 Age: 62 y.o. Gender: female  Primary Care Provider: Zigmund Gottron, MD Consultants: Nephrology, CCM Code Status: Full  Pt Overview and Major Events to Date:  11/10: Admitted to SDU with ARF and acidosis.  11/12: Managing metabolic derangements  Assessment and Plan: [name] [name] is a 62 y.o. female presenting with vomiting and increased colostomy output found to have high anion gap metabolic acidosis. PMH is significant for crohn's disease s/p surgical resection.   # Acute renal failure on CKD stage III.  - Creatinine baseline (~2.0) 6.46 on arrival, improving (3.42).  - High anion gap metabolic acidosis (ABG: 6.301/60/109/3.2/-35/57%) on arrival.  - Gap closed 25 >> 11. Bicarb 8 >> 28  - Now metabolic alkalosis (ABG: 3.220/25.4/ 63.6/ 37.8/ BE 14.6/ 95.6%)  - As bicarbonate losses lessened with improvement of crohn's flare/gastroenteritis, Bicarbonate continued at high rate. Over correction has resulted in metabolic alkalosis and electrolyte abnormalities  # Hypocalcemia: - Signs of tetany in upper extremities - ECG with prolonged QTc (528m) - IV calcium gluconate 1gm for now and recheck Ca  - check PTH/vitD/phos  # Hypomagnesemia  - Magnesium sulfate 2g IVPB - Repletion to protect heart rhythm in setting of prolonged QT and to aid repletion of K, Ca.   # Hypokalemia 2.9  - Due to rapid correction of acidosis.  - Continue supplementing orally and via IV runs/infusion.   # Anxiety:  - Holding ativan for now - ECG nonischemic - Recheck troponin  - Called daughter, who is coming later today. She was speaking with pt when I left.   # High ostomy output Gastroenteritis vs crohn's flare, improving. - CT abdomen/pelvis is unremarkable with no acute findings suggestive of etiology.  - No leukocytosis or fever,  abdominal exam not concerning for acute abdomen - Symptomatic management with IV hydration, zofran IV prn  - Will continue home mesalamine. - Appreciate ostomy consult. Pt with education and supplies.   # Chest pain Non-anginal.   - Troponins neg x3 - ECG without concern for ischemia this AM - Monitor on telemetry   # CAD, HTN, Hypercholesterolemia:  - History of viral myocarditis in 1990 and HFrEF, EF 30-35% on echo Nov 2012; improved to 60-65% with no mention of diastolic dysfunction on most recent echo (June 2013), [facility] with subsegmental atelectasis and no signs of volume overload.  - Continue home plavix, which was started during admission Nov 2013 after episode of chest pain. Has documented anaphylactic reaction to ASA.  - Holding home toprol-XL 246mdue to hypotension. Improving, 129/75 this AM - Not on ACE inhibitor due to CKD  - Continue statin. Most recent lipid profile: LDL 76, HDL 38, total 142, trigs 139.   # COPD  - No home medications listed, no oxygen requirement at this time.   # History of anemia of chronic disease  - Precipitous drop in platelets and hgb due to hemodilution, no s/sxs bleeding. Recheck shows less, but continued drop (hgb 10, plt 137) - Last iron studies were normal in Feb 2013.  # Vitamin D deficiency  - No home supplementation noted in EMR, last checked May 2013 and was 17. Defer further management to PCP   # Tobacco abuse  - Cessation counseling prior to discharge.   FEN/GI: 1/2NS w/20 mEq KCl @100cc /hr, bland diet Prophylaxis: Subcutaneous heparin  Disposition: Continue very close management  in SDU  Subjective: Pt very anxious, light headed, feeling numbness in L arm after tourniquet applied. No CP/SOB. On phone with daughter, who is coming later today.   Objective: Temp:  [98.4 F (36.9 C)-99.4 F (37.4 C)] 98.9 F (37.2 C) (11/12 0746) Pulse Rate:  [95-102] 95 (11/12 0528) Resp:  [13-30] 22 (11/12 0746) BP:  (86-154)/(46-89) 129/75 mmHg (11/12 0746) SpO2:  [93 %-98 %] 98 % (11/12 0528) Physical Exam: General: ill-appearing, very anxious 62 yo female in distress HEENT: Blepharospasm of lower eyelids bilaterally Cardiovascular: Tachycardic, rate 110, no M/R/G, No JVD Respiratory: Increased rate, non-labored, no wheezes or crackles Abdomen: Hyperactive BS, soft, very mildly tender to palpation generally, ostomy site without erythema, stump pink and bag with yellow/clear output.  Extremities: Warm, good strength, no edema.   Laboratory:  Recent Labs Lab 05/15/13 1811 05/16/13 0245  WBC 9.5 9.1  HGB 14.8 10.6*  HCT 43.9 31.7*  PLT 232 143*    Recent Labs Lab 05/15/13 1811  05/16/13 0245 05/16/13 1115 05/16/13 1825  NA 130*  --  136 140 140  K 6.1*  < > 4.0 2.7* 2.7*  CL 97  --  107 100 101  CO2 8*  --  11* 27 28  BUN 50*  --  51* 46* 40*  CREATININE 6.46*  --  5.30* 4.15* 3.42*  CALCIUM 9.9  --  7.3* 7.0* 6.9*  PROT 10.5*  --   --   --   --   BILITOT 0.4  --   --   --   --   ALKPHOS 209*  --   --   --   --   ALT 49*  --   --   --   --   AST 40*  --   --   --   --   GLUCOSE 92  --  106* 120* 120*  < > = values in this interval not displayed. CT ABDOMEN/PELVIS  FINDINGS:  Visualized lung bases appear normal. These unenhanced images  demonstrate no focal abnormality involving the liver, spleen or  pancreas. Status post cholecystectomy. Adrenal glands appear normal.  Atherosclerotic calcifications of abdominal aorta are noted without  aneurysm formation. No hydronephrosis or renal obstruction is noted.  No renal or ureteral calculi are noted. Status post colectomy with  ostomy seen in left lower quadrant. Small bowel is again noted in  the associated hernia without evidence of bowel obstruction or  ileus. No definite inflammatory changes are noted. No abnormal fluid  collection is noted. Urinary bladder is decompressed. Uterus appears  normal.  IMPRESSION:  Stable  postsurgical changes compared to prior exam. No acute  abnormality seen and abdomen or pelvis.   CXR  FINDINGS:  The heart size and mediastinal contours are within normal limits.  Right lung is clear. Curvilinear density is seen in lingular region  consistent with scarring or subsegmental atelectasis. No  pneumothorax or pleural effusion is noted. The visualized skeletal  structures are unremarkable.  IMPRESSION:  Curvilinear lingular density consistent with scarring or  subsegmental atelectasis.   ECG 11/11 Sinus tachycardia with normal axis, no conduction delay, no evidence of ischemia, and no peaked T waves. QTc 479  ECG 11/12 Normal sinus rhythm with rate of 100, normal axis, no ST segment changes. Prolonged QTc Vance Gather, MD 05/17/2013, 8:15 AM PGY-1, [place] Intern pager: [phone], text pages welcome

## 2013-05-18 DIAGNOSIS — E559 Vitamin D deficiency, unspecified: Secondary | ICD-10-CM

## 2013-05-18 LAB — BLOOD GAS, ARTERIAL
Acid-Base Excess: 4.2 mmol/L — ABNORMAL HIGH (ref 0.0–2.0)
Drawn by: 307971
O2 Saturation: 96.9 %
Patient temperature: 98.6
TCO2: 28.3 mmol/L (ref 0–100)
pCO2 arterial: 34.5 mmHg — ABNORMAL LOW (ref 35.0–45.0)

## 2013-05-18 LAB — PTH, INTACT AND CALCIUM
Calcium, Total (PTH): 7.4 mg/dL — ABNORMAL LOW (ref 8.4–10.5)
PTH: 234.1 pg/mL — ABNORMAL HIGH (ref 14.0–72.0)

## 2013-05-18 LAB — BASIC METABOLIC PANEL
BUN: 15 mg/dL (ref 6–23)
BUN: 15 mg/dL (ref 6–23)
CO2: 30 mEq/L (ref 19–32)
CO2: 27 mEq/L (ref 19–32)
Calcium: 7.4 mg/dL — ABNORMAL LOW (ref 8.4–10.5)
Calcium: 8.2 mg/dL — ABNORMAL LOW (ref 8.4–10.5)
Creatinine, Ser: 1.76 mg/dL — ABNORMAL HIGH (ref 0.50–1.10)
Creatinine, Ser: 1.74 mg/dL — ABNORMAL HIGH (ref 0.50–1.10)
GFR calc Af Amer: 35 mL/min — ABNORMAL LOW (ref >90)
GFR calc non Af Amer: 30 mL/min — ABNORMAL LOW (ref >90)
GFR calc non Af Amer: 31 mL/min — ABNORMAL LOW (ref >90)
Glucose, Bld: 97 mg/dL (ref 70–99)
Glucose, Bld: 98 mg/dL (ref 70–99)
Potassium: 4.3 mEq/L (ref 3.5–5.1)
Sodium: 136 mEq/L (ref 135–145)

## 2013-05-18 MED ORDER — SODIUM CHLORIDE 0.9 % IV SOLN
1.0000 g | Freq: Once | INTRAVENOUS | Status: AC
  Administered 2013-05-18: 1 g via INTRAVENOUS
  Filled 2013-05-18: qty 10

## 2013-05-18 MED ORDER — ENSURE COMPLETE PO LIQD
237.0000 mL | Freq: Two times a day (BID) | ORAL | Status: DC
  Administered 2013-05-18 – 2013-05-20 (×6): 237 mL via ORAL

## 2013-05-18 MED ORDER — SODIUM PHOSPHATE 3 MMOLE/ML IV SOLN
30.0000 mmol | Freq: Four times a day (QID) | INTRAVENOUS | Status: AC
  Administered 2013-05-19 (×2): 30 mmol via INTRAVENOUS
  Filled 2013-05-18 (×2): qty 10

## 2013-05-18 MED ORDER — POTASSIUM PHOSPHATE DIBASIC 3 MMOLE/ML IV SOLN
20.0000 mmol | Freq: Once | INTRAVENOUS | Status: AC
  Administered 2013-05-18: 20 mmol via INTRAVENOUS
  Filled 2013-05-18: qty 6.67

## 2013-05-18 MED ORDER — SODIUM CHLORIDE 0.45 % IV SOLN
INTRAVENOUS | Status: DC
  Administered 2013-05-18: 19:00:00 via INTRAVENOUS

## 2013-05-18 MED ORDER — DEXTROSE 5 % IV SOLN: 30.0000 mmol | Freq: Four times a day (QID) | INTRAVENOUS | Status: DC

## 2013-05-18 MED ORDER — POTASSIUM & SODIUM PHOSPHATES 280-160-250 MG PO PACK
2.0000 | PACK | Freq: Three times a day (TID) | ORAL | Status: DC
  Filled 2013-05-18 (×3): qty 2

## 2013-05-18 MED ORDER — VITAMIN D (ERGOCALCIFEROL) 1.25 MG (50000 UNIT) PO CAPS
50000.0000 [IU] | ORAL_CAPSULE | ORAL | Status: DC
  Administered 2013-05-18: 50000 [IU] via ORAL
  Filled 2013-05-18: qty 1

## 2013-05-18 MED ORDER — SODIUM POLYSTYRENE SULFONATE 15 GM/60ML PO SUSP
15.0000 g | Freq: Once | ORAL | Status: DC
  Filled 2013-05-18: qty 60

## 2013-05-18 NOTE — Progress Notes (Addendum)
Attending Addendum  I examined the patient and discussed the assessment and plan with Dr. Bonner Puna. I have reviewed the note and agree.  Patient seen today. Much more calm. Reports tetany only when BP checked. Ostomy output is still watery.   1. Metabolic acidosis overcorrected to alkalosis: resolved after d/cing bicarb.  2. Hypokalemia: resolved. Removing K+ from IV fluids, KVO IV.   3. Hypocalcemia: improved. Giving additional  IV calcium today.  4. Hypophosphatemia: repleting with IV phosphorous (not with calcium).   5. Vit D deficiency: resulting in hypocalcemia and hypophosphatemia. Repeating Vit D level. Given oral vit D.   Patient stable for transfer from stepdown unit to floor bed. KVO IV. Advance diet to regular.  Repeat BMP at 1600 and tomorrow AM. Continue tele.      Boykin Nearing, [place]

## 2013-05-18 NOTE — Addendum Note (Signed)
Addended by: Zenia Resides on: 05/18/2013 09:14 AM   Modules accepted: Orders

## 2013-05-18 NOTE — Progress Notes (Signed)
CRITICAL VALUE ALERT  Critical value received: Phosphorous 0.8  Date of notification:  05/18/2013   Time of notification:  2123  Critical value read back: yes  Nurse who received alert:  Reatha Armour RN  MD notified (1st page): [place]  Time of first page: 2131  MD notified (2nd page):  Time of second page:  Responding MD: Marily Memos  Time MD responded:  2136

## 2013-05-18 NOTE — Progress Notes (Signed)
Family Medicine Teaching Service Daily Progress Note Intern Pager: [phone]  Patient name: [name] [name] Medical record number: [id number] Date of birth: 1951/04/16 Age: 62 y.o. Gender: female  Primary Care Provider: Zigmund Gottron, MD Consultants: Nephrology, CCM Code Status: Full  Pt Overview and Major Events to Date:  11/10: Admitted to SDU with ARF and acidosis.  11/12: Managing metabolic derangements  Assessment and Plan: [name] [name] is a 62 y.o. female presenting with vomiting and increased colostomy output found to have high anion gap metabolic acidosis. PMH is significant for crohn's disease s/p surgical resection.   # Acute renal failure on CKD stage III.  - Creatinine (baseline ~2.0) 6.46 on arrival, improving (1.76).  - High anion gap metabolic acidosis (ABG: 2.694/85/462/7.0/-35/00%) on arrival.  - Gap closed 25 >> 11. Bicarb 8 >> 28  - 93/81: metabolic alkalosis (ABG: 8.299/37.1/ 63.6/ 37.8/ BE 14.6/ 95.6%)  - 11/13: ABG pending  # Hypophosphatemia:  - 0.6: IV repletion following Ca gluconate, re check this evening.   # Hypocalcemia: - Signs of tetany in upper extremities - IV calcium gluconate 1gm for now and recheck BMP this PM - check PTH, VitD in progress: Giving VitD empirically   # Hypomagnesemia  - Repleted yesterday  # Hypokalemia  - Corrected to 4.3: stop KCl in IV fluids  # Anxiety:  - Holding ativan for now - ECG nonischemic - Recheck troponin  - Called daughter, who is coming later today. She was speaking with pt when I left.   # High ostomy output Gastroenteritis vs crohn's flare, improving. - CT abdomen/pelvis is unremarkable with no acute findings suggestive of etiology.  - No leukocytosis or fever, abdominal exam not concerning for acute abdomen - Symptomatic management with IV hydration, zofran IV prn  - Will continue home mesalamine. - Appreciate ostomy consult. Pt with education and supplies.   # Chest pain Non-anginal.   -  Troponins neg x3 - ECG continues to be without concern for ischemia this AM - Monitor on telemetry   # CAD, HTN, Hypercholesterolemia:  - History of viral myocarditis in 1990 and HFrEF, EF 30-35% on echo Nov 2012; improved to 60-65% with no mention of diastolic dysfunction on most recent echo (June 2013), followed by [facility] - CXR with subsegmental atelectasis and no signs of volume overload.  - Continue home plavix, which was started during admission Nov 2013 after episode of chest pain. Has documented anaphylactic reaction to ASA.  - Holding home toprol-XL 52m due to hypotension. Improving, 129/75 this AM - Not on ACE inhibitor due to CKD  - Continue statin. Most recent lipid profile: LDL 76, HDL 38, total 142, trigs 139.   # COPD  - No home medications listed, no oxygen requirement at this time.   # History of anemia of chronic disease  - Precipitous drop in platelets and hgb due to hemodilution, no s/sxs bleeding. Recheck shows less, but continued drop (hgb 10, plt 137) - Last iron studies were normal in Feb 2013.  # Tobacco abuse  - Cessation counseling prior to discharge.   FEN/GI: 1/2NS @100cc /hr, bland diet Prophylaxis: Subcutaneous heparin  Disposition: Stable for transfer to telemetry floor and continued management there  Subjective: Pt looks much better today, still a bit weak, and still with high volume from ostomy. Denies N/V/SOB/CP. She's much less anxious and is talking to her daughter frequently.   Objective: Temp:  [98.5 F (36.9 C)-99.9 F (37.7 C)] 98.5 F (36.9 C) (11/13 0011) Pulse Rate:  [92-118]  100 (11/13 0011) Resp:  [10-25] 10 (11/13 0011) BP: (126-138)/(57-75) 135/62 mmHg (11/13 0011) SpO2:  [94 %-100 %] 94 % (11/13 0011) Weight:  [141 lb (63.957 kg)] 141 lb (63.957 kg) (11/12 1600) Physical Exam: General: chronically ill-appearing 62 yo female in NAD Cardiovascular: Rate 100, regular, no M/R/G, No JVD Respiratory: Non-labored, no wheezes or  crackles Abdomen: Hyperactive BS, soft, very mildly tender to palpation generally, ostomy site without erythema, stump pink and bag with yellow/clear output.  Extremities: Warm, good strength, no edema.   Laboratory:  Recent Labs Lab 05/15/13 1811 05/16/13 0245 05/17/13 0920  WBC 9.5 9.1 6.4  HGB 14.8 10.6* 10.0*  HCT 43.9 31.7* 29.6*  PLT 232 143* 137*    Recent Labs Lab 05/15/13 1811  05/17/13 0920 05/17/13 1200 05/18/13 0342  NA 130*  < > 144 142 140  K 6.1*  < > 2.9* 3.1* 4.3  CL 97  < > 95* 94* 102  CO2 8*  < > 39* 35* 30  BUN 50*  < > 25* 21 15  CREATININE 6.46*  < > 2.18* 2.00* 1.76*  CALCIUM 9.9  < > 6.9* 7.6* 7.4*  PROT 10.5*  --   --   --   --   BILITOT 0.4  --   --   --   --   ALKPHOS 209*  --   --   --   --   ALT 49*  --   --   --   --   AST 40*  --   --   --   --   GLUCOSE 92  < > 106* 131* 97  < > = values in this interval not displayed. CT ABDOMEN/PELVIS  FINDINGS:  Visualized lung bases appear normal. These unenhanced images  demonstrate no focal abnormality involving the liver, spleen or  pancreas. Status post cholecystectomy. Adrenal glands appear normal.  Atherosclerotic calcifications of abdominal aorta are noted without  aneurysm formation. No hydronephrosis or renal obstruction is noted.  No renal or ureteral calculi are noted. Status post colectomy with  ostomy seen in left lower quadrant. Small bowel is again noted in  the associated hernia without evidence of bowel obstruction or  ileus. No definite inflammatory changes are noted. No abnormal fluid  collection is noted. Urinary bladder is decompressed. Uterus appears  normal.  IMPRESSION:  Stable postsurgical changes compared to prior exam. No acute  abnormality seen and abdomen or pelvis.   CXR  FINDINGS:  The heart size and mediastinal contours are within normal limits.  Right lung is clear. Curvilinear density is seen in lingular region  consistent with scarring or subsegmental  atelectasis. No  pneumothorax or pleural effusion is noted. The visualized skeletal  structures are unremarkable.  IMPRESSION:  Curvilinear lingular density consistent with scarring or  subsegmental atelectasis.   ECG 11/11 Sinus tachycardia with normal axis, no conduction delay, no evidence of ischemia, and no peaked T waves. QTc 479  ECG 11/12 Normal sinus rhythm with rate of 100, normal axis, no ST segment changes. Prolonged QTc Vance Gather, MD 05/18/2013, 7:19 AM PGY-1, [place] Intern pager: [phone], text pages welcome

## 2013-05-18 NOTE — Progress Notes (Addendum)
Family Medicine Progress Note  S: Pt states she is feeling much better adn that her ostomy output is significantly reduced since admission. Feels ready to sleep for the night.    O:  BP 112/62  Pulse 108  Temp(Src) 98.7 F (37.1 C) (Oral)  Resp 20  Ht 5' 6"  (1.676 m)  Wt 141 lb (63.957 kg)  BMI 22.77 kg/m2  SpO2 99% Gen: NAD, resting in bed CV: RRR, no m/r/g Res: CTAB, nml effort Abd: soft non-ttp   Critical labs called back  Lab Results  Component Value Date   CALCIUM 8.2* 05/18/2013   PHOS 0.8* 05/18/2013    Lab Results  Component Value Date   CREATININE 1.74* 05/18/2013   BUN 15 05/18/2013   NA 136 05/18/2013   K 6.0* 05/18/2013   CL 102 05/18/2013   CO2 27 05/18/2013   Ca, PHos, have responded to previous treatments w/ K phos and Ca gluconate (also used for HyperK)  Na Phos (x2, no K phos due to hyper K) and Ca Gluconate due to persistently low Ca, very low Phos, and elevated K. Recheck BMP iat 0100 and at [place], MD Family Medicine PGY-3 05/18/2013, 10:21 PM

## 2013-05-18 NOTE — Assessment & Plan Note (Addendum)
Discussed with Dr. Kennon Rounds about 2nd abnormal pap.  She is willing to accept in referral for colpo..  ----- Message from Donnamae Jude, MD sent at 05/17/2013 1:36 PM ----- I would be happy to try or someone in my office could--please copy Cathlean Marseilles when ready to refer so she could be appropriately scheduled. ----- Message from Zigmund Gottron, MD sent at 05/17/2013 12:32 PM ----- Clancy Gourd talking to you yesterday. I apologize for this long story. Patient has an abnormal PAP x 2 - ASC-H with no high risk HPV. She is a difficult pelvic exam. She has lots of vaginal scarring and distortion from her Crohn's Disease. Per my limited view, the cervix was visibly abnormal. [name] tried a colpo on her 6 months ago and failed due to difficult anatomy, high patient anxiety and discomfort. Now that I have a second abnormal pap, it sounds like she must have a colpo. [name] suggested [facility] Najjar - thinking he might have a bit more time in his office than your busy clinic. I sent Juanda Crumble an epic message such as this one and he has not responded. I infer that he is not anxious to take on this difficult patient. (Don't get me wrong. Kenidy is very nice. She is just very anxious and a difficult exam.) Bottom line: Would you be willing to see in your GYN clinic. Is there a better place to refer her? PS Patient now in the hospital, so referral will need to wait for her recovery. Thanks in advance. [place]

## 2013-05-19 LAB — CBC
Hemoglobin: 10.3 g/dL — ABNORMAL LOW (ref 12.0–15.0)
MCH: 30.9 pg (ref 26.0–34.0)
MCHC: 32.5 g/dL (ref 30.0–36.0)
MCV: 95.2 fL (ref 78.0–100.0)
Platelets: 145 10*3/uL — ABNORMAL LOW (ref 150–400)
RBC: 3.33 MIL/uL — ABNORMAL LOW (ref 3.87–5.11)

## 2013-05-19 LAB — BASIC METABOLIC PANEL
BUN: 15 mg/dL (ref 6–23)
BUN: 14 mg/dL (ref 6–23)
CO2: 24 mEq/L (ref 19–32)
CO2: 21 mEq/L (ref 19–32)
Calcium: 8.9 mg/dL (ref 8.4–10.5)
Calcium: 8.3 mg/dL — ABNORMAL LOW (ref 8.4–10.5)
Chloride: 102 mEq/L (ref 96–112)
Creatinine, Ser: 1.77 mg/dL — ABNORMAL HIGH (ref 0.50–1.10)
GFR calc Af Amer: 36 mL/min — ABNORMAL LOW (ref >90)
GFR calc Af Amer: 35 mL/min — ABNORMAL LOW (ref >90)
GFR calc non Af Amer: 31 mL/min — ABNORMAL LOW (ref >90)
Glucose, Bld: 100 mg/dL — ABNORMAL HIGH (ref 70–99)
Sodium: 135 mEq/L (ref 135–145)

## 2013-05-19 LAB — PHOSPHORUS: Phosphorus: 5.2 mg/dL — ABNORMAL HIGH (ref 2.3–4.6)

## 2013-05-19 LAB — VITAMIN D 25 HYDROXY (VIT D DEFICIENCY, FRACTURES): Vit D, 25-Hydroxy: 11 ng/mL — ABNORMAL LOW (ref 30–89)

## 2013-05-19 MED ORDER — CALCIUM CITRATE 950 (200 CA) MG PO TABS
200.0000 mg | ORAL_TABLET | Freq: Two times a day (BID) | ORAL | Status: DC
  Administered 2013-05-19 – 2013-05-20 (×2): 200 mg via ORAL
  Filled 2013-05-19 (×4): qty 1

## 2013-05-19 MED ORDER — CALCIUM CARBONATE 1250 (500 CA) MG PO TABS
500.0000 mg | ORAL_TABLET | Freq: Two times a day (BID) | ORAL | Status: DC
  Administered 2013-05-19: 500 mg via ORAL
  Filled 2013-05-19 (×2): qty 1

## 2013-05-19 MED ORDER — SODIUM PHOSPHATE 3 MMOLE/ML IV SOLN: 30.0000 mmol | Freq: Once | INTRAVENOUS | Status: DC

## 2013-05-19 MED ORDER — MAGNESIUM OXIDE 400 (241.3 MG) MG PO TABS
400.0000 mg | ORAL_TABLET | Freq: Every day | ORAL | Status: DC
  Administered 2013-05-19 – 2013-05-20 (×2): 400 mg via ORAL
  Filled 2013-05-19 (×2): qty 1

## 2013-05-19 MED ORDER — METOPROLOL SUCCINATE ER 50 MG PO TB24
50.0000 mg | ORAL_TABLET | Freq: Every day | ORAL | Status: DC
  Administered 2013-05-19 – 2013-05-20 (×2): 50 mg via ORAL
  Filled 2013-05-19 (×2): qty 1

## 2013-05-19 MED ORDER — SODIUM POLYSTYRENE SULFONATE 15 GM/60ML PO SUSP
15.0000 g | Freq: Once | ORAL | Status: AC
  Administered 2013-05-19: 15 g via ORAL
  Filled 2013-05-19: qty 60

## 2013-05-19 NOTE — Progress Notes (Signed)
Attending Addendum  I examined the patient and discussed the assessment and plan with Dr. Bonner Puna. I have reviewed the note and agree.  Approaching discharge. Repeat calcium in AM, patient on Cal citrate. I see that repeat phos is elevated to 5.2 s/p repletion, patient not at risk for hyperphosphatemia given normal renal function and no longer getting exogenous phosphate. Plan to continue cal citrate rather than cal carbonate since she is at risk of low phosphate.   Medically stable for discharge to home tomorrow.     Boykin Nearing, [place]

## 2013-05-19 NOTE — Progress Notes (Signed)
Family Medicine Teaching Service Daily Progress Note Intern Pager: [phone]  Patient name: [name] [name] Medical record number: [id number] Date of birth: 06/27/1951 Age: 63 y.o. Gender: female  Primary Care Provider: Zigmund Gottron, MD Consultants: Nephrology, CCM Code Status: Full  Pt Overview and Major Events to Date:  11/10: Admitted to SDU with ARF and acidosis.  11/12: Managing metabolic derangements  Assessment and Plan: [name] [name] is a 62 y.o. female presenting with vomiting and increased colostomy output found to have high anion gap metabolic acidosis. PMH is significant for crohn's disease s/p surgical resection.   # Acute renal failure on CKD stage III.  - Creatinine (baseline ~2.0) 6.46 on arrival, improving (1.72).  - High anion gap metabolic acidosis (ABG: 7.253/66/440/3.4/-74/25%) on arrival.  - Gap closed 25 >> 11. Bicarb 8 >> 28  - 95/63: metabolic alkalosis (ABG: 8.756/43.3/ 63.6/ 37.8/ BE 14.6/ 95.6%)  - 11/13: improving, alkalotic (ABG: 7.510/34.5/73.6/27.3/BE 4.2/96.9%)  # Hypophosphatemia:  - 0.6: IV repletion, 1.4 on recheck. Giving another run of NaPhos - Recheck labs at 1700  # Hypocalcemia: - 8.9 Repleted with CaGluconate - Starting oral CaCitrate 460m BID  # Vitamin D deficiency - Vit D: 11 - Giving VitD; continue for 8 weeks.   # Hypomagnesemia  - Repleted yesterday  # Hyperkalemia  - Up to 5.8 overnight, Rx: kayexalate, Ca Gluconate  # Anxiety:  - Holding ativan for now - ECG nonischemic - Recheck troponin  - Daughter is being updated.   # High ostomy output Gastroenteritis vs crohn's flare, improving. - CT abdomen/pelvis is unremarkable with no acute findings suggestive of etiology.  - No leukocytosis or fever, abdominal exam not concerning for acute abdomen - Symptomatic management with IV hydration, zofran IV prn  - Will continue home mesalamine. - Appreciate ostomy consult. Pt with education and supplies.   # Chest pain  Non-anginal.   - Troponins neg x3 - ECG without concern for ischemia this AM - Monitor on telemetry   # CAD, HTN, Hypercholesterolemia:  - History of viral myocarditis in 1990 and HFrEF, EF 30-35% on echo Nov 2012; improved to 60-65% with no mention of diastolic dysfunction on most recent echo (June 2013), [facility] CXR with subsegmental atelectasis and no signs of volume overload.  - Continue home plavix, which was started during admission Nov 2013 after episode of chest pain. Has documented anaphylactic reaction to ASA.  - Restarting home toprol-XL 258mafter original hypotension - Not on ACE inhibitor due to CKD  - Continue statin. Most recent lipid profile: LDL 76, HDL 38, total 142, trigs 139.   # COPD  - No home medications listed, no oxygen requirement at this time.   # History of anemia of chronic disease  - Hemoglobin stable > 10 - Last iron studies were normal in Feb 2013.  # Tobacco abuse  - Cessation counseling prior to discharge.   FEN/GI: 1/2NS @KVO , regular diet Prophylaxis: Subcutaneous heparin  Disposition: Stable for transfer to telemetry floor and continued management there  Subjective: Pt is much less anxious, reports no pain, decreased ostomy output, but is still clear. No CP/SOB/N/V/abd pain  Objective: Temp:  [97.6 F (36.4 C)-99.2 F (37.3 C)] 98.8 F (37.1 C) (11/14 0700) Pulse Rate:  [98-108] 98 (11/14 0700) Resp:  [16-24] 16 (11/14 0700) BP: (106-139)/(62-79) 126/74 mmHg (11/14 0700) SpO2:  [96 %-99 %] 96 % (11/14 0700) Physical Exam: General: chronically [age]-appearing [age]o female in NAD Cardiovascular: RRR, no M/R/G, No JVD Respiratory:  Non-labored, no wheezes or crackles Abdomen: +BS, soft, not tender, ostomy site without erythema, stump pink and bag with yellow/clear output.  Extremities: Warm, good strength, no edema.   Laboratory:  Recent Labs Lab 05/16/13 0245 05/17/13 0920 05/19/13 0125  WBC 9.1 6.4 8.2  HGB 10.6*  10.0* 10.3*  HCT 31.7* 29.6* 31.7*  PLT 143* 137* 145*    Recent Labs Lab 05/15/13 1811  05/18/13 0342 05/18/13 1942 05/19/13 0125  NA 130*  < > 140 136 135  K 6.1*  < > 4.3 6.0* 5.8*  CL 97  < > 102 102 101  CO2 8*  < > 30 27 24   BUN 50*  < > 15 15 15   CREATININE 6.46*  < > 1.76* 1.74* 1.72*  CALCIUM 9.9  < > 7.4* 8.2* 8.9  PROT 10.5*  --   --   --   --   BILITOT 0.4  --   --   --   --   ALKPHOS 209*  --   --   --   --   ALT 49*  --   --   --   --   AST 40*  --   --   --   --   GLUCOSE 92  < > 97 98 100*  < > = values in this interval not displayed. CT ABDOMEN/PELVIS  FINDINGS:  Visualized lung bases appear normal. These unenhanced images  demonstrate no focal abnormality involving the liver, spleen or  pancreas. Status post cholecystectomy. Adrenal glands appear normal.  Atherosclerotic calcifications of abdominal aorta are noted without  aneurysm formation. No hydronephrosis or renal obstruction is noted.  No renal or ureteral calculi are noted. Status post colectomy with  ostomy seen in left lower quadrant. Small bowel is again noted in  the associated hernia without evidence of bowel obstruction or  ileus. No definite inflammatory changes are noted. No abnormal fluid  collection is noted. Urinary bladder is decompressed. Uterus appears  normal.  IMPRESSION:  Stable postsurgical changes compared to prior exam. No acute  abnormality seen and abdomen or pelvis.   CXR  FINDINGS:  The heart size and mediastinal contours are within normal limits.  Right lung is clear. Curvilinear density is seen in lingular region  consistent with scarring or subsegmental atelectasis. No  pneumothorax or pleural effusion is noted. The visualized skeletal  structures are unremarkable.  IMPRESSION:  Curvilinear lingular density consistent with scarring or  subsegmental atelectasis.   ECG 11/11 Sinus tachycardia with normal axis, no conduction delay, no evidence of ischemia, and no  peaked T waves. QTc 479  ECG 11/12 Normal sinus rhythm with rate of 100, normal axis, no ST segment changes. Prolonged QTc  Vance Gather, MD 05/19/2013, 8:27 AM PGY-1, [place] Intern pager: [phone], text pages welcome

## 2013-05-19 NOTE — Progress Notes (Signed)
Report called to [facility]

## 2013-05-20 DIAGNOSIS — E559 Vitamin D deficiency, unspecified: Secondary | ICD-10-CM | POA: Diagnosis present

## 2013-05-20 LAB — BASIC METABOLIC PANEL
CO2: 23 mEq/L (ref 19–32)
Calcium: 8.8 mg/dL (ref 8.4–10.5)
Chloride: 104 mEq/L (ref 96–112)
Creatinine, Ser: 1.86 mg/dL — ABNORMAL HIGH (ref 0.50–1.10)
Glucose, Bld: 92 mg/dL (ref 70–99)
Potassium: 5 mEq/L (ref 3.5–5.1)

## 2013-05-20 LAB — PHOSPHORUS: Phosphorus: 3.8 mg/dL (ref 2.3–4.6)

## 2013-05-20 LAB — MAGNESIUM: Magnesium: 1.4 mg/dL — ABNORMAL LOW (ref 1.5–2.5)

## 2013-05-20 MED ORDER — MAGNESIUM OXIDE 400 (241.3 MG) MG PO TABS: 400.0000 mg | ORAL_TABLET | Freq: Every day | ORAL | Status: DC

## 2013-05-20 MED ORDER — VITAMIN D (ERGOCALCIFEROL) 1.25 MG (50000 UNIT) PO CAPS: 50000.0000 [IU] | ORAL_CAPSULE | ORAL | Status: DC

## 2013-05-20 MED ORDER — CALCIUM CITRATE 950 (200 CA) MG PO TABS: 200.0000 mg | ORAL_TABLET | Freq: Two times a day (BID) | ORAL | Status: DC

## 2013-05-20 NOTE — Progress Notes (Signed)
Patient ambulated from her room to the nursing station and back without SOB or an increase in HR.

## 2013-05-20 NOTE — Progress Notes (Signed)
Attending Addendum  I  discussed the assessment and plan with Dr. Venetia Maxon. I have reviewed the note and agree. Patient medically stable for discharge since I saw her last evening.  New meds: vit D, cal citrate, mag ox.     [name] [name], [place]

## 2013-05-20 NOTE — Discharge Summary (Signed)
Attending Addendum  I examined the patient and discussed the discharge plan with Dr. Berkley Harvey. I have reviewed the note and agree.  Patient will need new Rx for ostomy supplies. She reports having enough for now. Her PCP is aware and is taking over ordering supplies, per patient.     Boykin Nearing, [place]

## 2013-05-20 NOTE — Progress Notes (Signed)
Family Medicine Teaching Service Daily Progress Note Intern Pager: [phone]  Patient name: [name] [name] Medical record number: [id number] Date of birth: September 27, 1950 Age: 62 y.o. Gender: female  Primary Care Provider: Zigmund Gottron, MD Consultants: Nephrology, CCM Code Status: Full  Pt Overview and Major Events to Date:  11/10: Admitted to SDU with ARF and acidosis.  11/12-14: Managing metabolic derangements, improved  Assessment and Plan: [name] [name] is a 62 y.o. female presenting with vomiting and increased colostomy output found to have high anion gap metabolic acidosis. PMH is significant for crohn's disease s/p surgical resection.   # High ostomy output Gastroenteritis vs crohn's flare, improving with IV and now PO rehydration - CT abdomen/pelvis unremarkable with no acute findings suggestive of etiology.  - remains without leukocytosis or fever and benign abdominal exam - Continue continue home mesalamine. [ ]  consider another small fluid bolus for mild elevated heart rate, vs watch with PO intake  # Acute renal failure on CKD stage III.  - Creatinine (baseline ~2.0) 6.46 on arrival, improved to 1.8  # Hypophosphatemia:  - improved s/p repletion  # Hypocalcemia: - trending improved, previously on TUMS, changing to oral Ca Citrate 454m BID at discharge  # Vitamin D deficiency - Vit D: 11 intially - started qWeekly vitamin D to continue for 8 weeks.   # Hypomagnesemia  - repleting, plan to continue PO at discharge  # Hyperkalemia  - intermittent, now improved with stabilizing ostomy output, improved kidney function  # Anxiety:  - Restart home Ativan at discharge - ECG nonischemic, troponin negative  # Chest pain Non-anginal.   - Troponins neg x3 - ECG without concern for ischemia this AM  # CAD, HTN, Hypercholesterolemia:  - History of viral myocarditis in 1990 and HFrEF, EF 30-35% on echo Nov 2012; improved to 60-65% with no mention of diastolic  dysfunction on most recent echo (June 2013), [facility] with subsegmental atelectasis and no signs of volume overload.  - Continue home Plavix, which was started during admission Nov 2013 after episode of chest pain. Has documented anaphylactic reaction to ASA.  - Continue home Toprol-XL 269mafter original hypotension - Not on ACE inhibitor due to CKD  - Continue statin. Most recent lipid profile: LDL 76, HDL 38, total 142, trigs 139.   # COPD  - No home medications listed, no oxygen requirement at this time.   # History of anemia of chronic disease  - Hemoglobin stable > 10  # Tobacco abuse  - Cessation counseling prior to discharge.   FEN/GI: saline lock, regular diet Prophylaxis: Subcutaneous heparin  Disposition: Management as above, likely discharge later today if pt tolerates out of bed.  Subjective: Ostomy output "more like normal," with more formed material, though still loose. Pt denies pain but does feel a little "dizzy," and has not yet been up out of bed.   Objective: Temp:  [98.9 F (37.2 C)-100 F (37.8 C)] 98.9 F (37.2 C) (11/15 0438) Pulse Rate:  [101-112] 101 (11/15 0438) Resp:  [18] 18 (11/15 0438) BP: (112-122)/(71-82) 122/82 mmHg (11/15 0438) SpO2:  [96 %-100 %] 96 % (11/15 0438) Weight:  [136 lb (61.689 kg)] 136 lb (61.689 kg) (11/14 1342) Physical Exam: General: adult female in NAD Cardiovascular: mild tachycardia but regular rhythm, no M/R/G, No JVD Respiratory: CTAB, normal WOB Abdomen: +BS, soft, not tender, ostomy site stump pink and bag with brown/loose but some soft/formed output Extremities: Warm, well-perfused, no LE edema.   Laboratory:  Recent Labs Lab 05/16/13 0245 05/17/13 0920 05/19/13 0125  WBC 9.1 6.4 8.2  HGB 10.6* 10.0* 10.3*  HCT 31.7* 29.6* 31.7*  PLT 143* 137* 145*    Recent Labs Lab 05/15/13 1811  05/19/13 0125 05/19/13 1616 05/20/13 0533  NA 130*  < > 135 138 139  K 6.1*  < > 5.8* 4.7 5.0  CL 97   < > 101 102 104  CO2 8*  < > 24 21 23   BUN 50*  < > 15 14 15   CREATININE 6.46*  < > 1.72* 1.77* 1.86*  CALCIUM 9.9  < > 8.9 8.3* 8.8  PROT 10.5*  --   --   --   --   BILITOT 0.4  --   --   --   --   ALKPHOS 209*  --   --   --   --   ALT 49*  --   --   --   --   AST 40*  --   --   --   --   GLUCOSE 92  < > 100* 111* 92  < > = values in this interval not displayed. CT ABDOMEN/PELVIS  FINDINGS:  Visualized lung bases appear normal. These unenhanced images  demonstrate no focal abnormality involving the liver, spleen or  pancreas. Status post cholecystectomy. Adrenal glands appear normal.  Atherosclerotic calcifications of abdominal aorta are noted without  aneurysm formation. No hydronephrosis or renal obstruction is noted.  No renal or ureteral calculi are noted. Status post colectomy with  ostomy seen in left lower quadrant. Small bowel is again noted in  the associated hernia without evidence of bowel obstruction or  ileus. No definite inflammatory changes are noted. No abnormal fluid  collection is noted. Urinary bladder is decompressed. Uterus appears  normal.  IMPRESSION:  Stable postsurgical changes compared to prior exam. No acute  abnormality seen and abdomen or pelvis.   CXR  FINDINGS:  The heart size and mediastinal contours are within normal limits.  Right lung is clear. Curvilinear density is seen in lingular region  consistent with scarring or subsegmental atelectasis. No  pneumothorax or pleural effusion is noted. The visualized skeletal  structures are unremarkable.  IMPRESSION:  Curvilinear lingular density consistent with scarring or  subsegmental atelectasis.   ECG 11/11 Sinus tachycardia with normal axis, no conduction delay, no evidence of ischemia, and no peaked T waves. QTc 479  ECG 11/12 Normal sinus rhythm with rate of 100, normal axis, no ST segment changes. Prolonged QTc  [name], MD 05/20/2013, 9:31 AM PGY-2, [place] Intern pager: [phone], text pages welcome

## 2013-05-30 ENCOUNTER — Telehealth: Payer: Self-pay | Admitting: Family Medicine

## 2013-05-31 ENCOUNTER — Inpatient Hospital Stay: Payer: PRIVATE HEALTH INSURANCE | Admitting: Family Medicine

## 2013-06-08 ENCOUNTER — Encounter: Payer: Self-pay | Admitting: Obstetrics & Gynecology

## 2013-06-08 ENCOUNTER — Other Ambulatory Visit (HOSPITAL_COMMUNITY)
Admission: RE | Admit: 2013-06-08 | Discharge: 2013-06-08 | Disposition: A | Discharge status: Home / Self Care | Payer: PRIVATE HEALTH INSURANCE | Source: Ambulatory Visit | Attending: Obstetrics & Gynecology | Admitting: Obstetrics & Gynecology

## 2013-06-08 ENCOUNTER — Ambulatory Visit (INDEPENDENT_AMBULATORY_CARE_PROVIDER_SITE_OTHER): Payer: PRIVATE HEALTH INSURANCE | Admitting: Obstetrics & Gynecology

## 2013-06-08 VITALS — BP 123/73 | HR 86 | Temp 98.1°F | Ht 66.0 in

## 2013-06-08 DIAGNOSIS — R87611 Atypical squamous cells cannot exclude high grade squamous intraepithelial lesion on cytologic smear of cervix (ASC-H): Secondary | ICD-10-CM

## 2013-06-08 DIAGNOSIS — D069 Carcinoma in situ of cervix, unspecified: Secondary | ICD-10-CM | POA: Insufficient documentation

## 2013-06-08 DIAGNOSIS — N72 Inflammatory disease of cervix uteri: Secondary | ICD-10-CM | POA: Insufficient documentation

## 2013-06-08 NOTE — Patient Instructions (Signed)
Colposcopy Care After Colposcopy is a procedure in which a special tool is used to magnify the surface of the cervix. A tissue sample (biopsy) may also be taken. This sample will be looked at for cervical cancer or other problems. After the test:  You may have some cramping.  Lie down for a few minutes if you feel lightheaded.   You may have some bleeding which should stop in a few days. HOME CARE  Do not have sex or use tampons for 2 to 3 days or as told.  Only take medicine as told by your doctor.  Continue to take your birth control pills as usual. Finding out the results of your test Ask when your test results will be ready. Make sure you get your test results. GET HELP RIGHT AWAY IF:  You are bleeding a lot or are passing blood clots.  You develop a fever of 102 F (38.9 C) or higher.  You have abnormal vaginal discharge.  You have cramps that do not go away with medicine.  You feel lightheaded, dizzy, or pass out (faint). MAKE SURE YOU:   Understand these instructions.  Will watch your condition.  Will get help right away if you are not doing well or get worse. Document Released: 12/09/2007 Document Revised: 09/14/2011 Document Reviewed: 01/19/2013 [facility] Patient Information 2014 [place], [place]. Colposcopy Colposcopy is a procedure to examine your cervix and vagina, or the area around the outside of your vagina, for abnormalities or signs of disease. The procedure is done using a lighted microscope called a colposcope. Tissue samples may be collected during the colposcopy if your health care provider finds any unusual cells. A colposcopy may be done if a woman has:  An abnormal Pap test. A Pap test is a medical test done to evaluate cells that are on the surface of the cervix.  A Pap test result that is suggestive of human papillomavirus (HPV). This virus can cause genital warts and is linked to the development of cervical cancer.  A sore on her cervix and the  results of a Pap test were normal.  Genital warts on the cervix or in or around the outside of the vagina.  A mother who took the drug diethylstilbestrol (DES) while pregnant.  Painful intercourse.  Vaginal bleeding, especially after sexual intercourse. LET [facility] CARE PROVIDER KNOW ABOUT:  Any allergies you have.  All medicines you are taking, including vitamins, herbs, eye drops, creams, and over-the-counter medicines.  Previous problems you or members of your family have had with the use of anesthetics.  Any blood disorders you have.  Previous surgeries you have had.  Medical conditions you have. RISKS AND COMPLICATIONS Generally, a colposcopy is a safe procedure. However, as with any procedure, complications can occur. Possible complications include:  Bleeding.  Infection.  Missed lesions. BEFORE THE PROCEDURE   Tell your health care provider if you have your menstrual period. A colposcopy typically is not done during menstruation.  For 24 hours before the colposcopy, do not:  [place].  Use tampons.  Use medicines, creams, or suppositories in the vagina.  Have sexual intercourse. PROCEDURE  During the procedure, you will be lying on your back with your feet in foot rests (stirrups). A warm metal or plastic instrument (speculum) will be placed in your vagina to keep it open and to allow the health care provider to see the cervix. The colposcope will be placed outside the vagina. It will be used to magnify and examine the  cervix, vagina, and the area around the outside of the vagina. A small amount of liquid solution will be placed on the area that is to be viewed. This solution will make it easier to see the abnormal cells. Your health care provider will use tools to suck out mucus and cells from the canal of the cervix. Then he or she will record the location of the abnormal areas. If a biopsy is done during the procedure, a medicine will usually be given to numb  the area (local anesthetic). You may feel mild pain or cramping while the biopsy is done. After the procedure, tissue samples collected during the biopsy will be sent to a lab for analysis. AFTER THE PROCEDURE  You will be given instructions on when to follow up with your health care provider for your test results. It is important to keep your appointment. Document Released: 09/12/2002 Document Revised: 02/22/2013 Document Reviewed: 01/19/2013 [facility] Patient Information 2014 [place], [place]. Abnormal Pap Test Information During a Pap test, the cells on the surface of your cervix are checked to see if they look normal, abnormal, or if they show signs of having been altered by a certain type of virus called human papillomavirus, or HPV. Cervical cells that have been affected by HPV are called dysplasia. Dysplasia is not cancer, but describes abnormal cells found on the surface of the cervix. Depending on the degree of dysplasia, some of the cells may be considered pre-cancerous and may turn into cancer over time if follow up with a caregiver is delayed.  WHAT DOES AN ABNORMAL PAP TEST MEAN? Having an abnormal pap test does not mean that you have cancer. However, certain types of abnormal pap tests can be a sign that a person is at a higher risk of developing cancer. Your caregiver will want to do other tests to find out more about the abnormal cells. Your abnormal Pap test results could show:   Small and uncertain changes that should be carefully watched.   Cervical dysplasia that has caused mild changes and can be followed over time.  Cervical dysplasia that is more severe and needs to be followed and treated to ensure the problem goes away.  Cancer.  When severe cervical dysplasia is found and treated early, it rarely will grow into cancer.  WHAT WILL BE DONE ABOUT MY ABNORMAL PAP TEST?  A colposcopy may be needed. This is a procedure where your cervix is examined using light and  magnification.  A small tissue sample of your cervix (biopsy) may need to be removed and then examined. This is often performed if there are areas that appear infected.  A sample of cells from the cervical canal may be removed with either a small brush or scraping instrument (curette). Based on the results of the procedures above, some caregivers may recommend either cryotherapy of the cervix or a surgical LEEP where a portion of the cervix is removed. LEEP is short for "loop electrical excisional procedure." Rarely, a caregiver may recommend a cone biopsy.This is a procedure where a small, cone-shaped sample of your cervix is taken out. The part that is taken out is the area where the abnormal cells are.  WHAT IF I HAVE A DYSPLASIA OR A CANCER? You may be referred to a specialist. Radiation may also be a treatment for more advanced cancer. Having a hysterectomy is the last treatment option for dysplasia, but it is a more common treatment for someone with cancer. All treatment options will be discussed  with you by your caregiver. WHAT SHOULD YOU DO AFTER BEING TREATED? If you have had an abnormal pap test, you should continue to have regular pap tests and check-ups as directed by your caregiver. Your cervical problem will be carefully watched so it does not get worse. Also, your caregiver can watch for, and treat, any new problems that may come up. Document Released: 10/07/2010 Document Revised: 10/17/2012 Document Reviewed: 06/18/2011 [facility] Patient Information 2014 [place], [place].

## 2013-06-08 NOTE — Progress Notes (Signed)
Patient ID: [name] [name], female   DOB: [birth year], 62 y.o.   MRN: [id number] Patient given informed consent, signed copy in the chart, time out was performed.  Placed in lithotomy position. Cervix viewed with speculum and colposcope after application of acetic acid.  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  Colposcopy adequate?  No (TZ not visualized atrophy noted; pt not tolerant of exam)  There was an irreg shape to the post lip of the cervix.  This could represent prev scarring.   Biopsies taken of this area    Acetowhite lesions?no Punctation?no Mosaicism?  no Abnormal vasculature? No Biopsies?x3 ECC?yes  Patient was given post procedure instructions.  She will return in 4 weeks for results. If further tx needed, needs to be done under anesthesia.   Kindsey Eblin L. Harraway-Smith, [name], Cherlynn June

## 2013-06-26 ENCOUNTER — Telehealth: Payer: Self-pay | Admitting: General Practice

## 2013-06-26 NOTE — Telephone Encounter (Signed)
Called patient and informed her of procedure and appt time and to take ativan before appt. Patient verbalized understanding to all and had no further questions

## 2013-06-26 NOTE — Telephone Encounter (Signed)
Message copied by Shelly Coss on Mon Jun 26, 2013  3:37 PM ------      Message from: Debarah Crape A      Created: Mon Jun 26, 2013 10:08 AM       Kept same appointment date and time.                   ----- Message -----         From: Ronnell Freshwater Day, RN         Sent: 06/23/2013  12:30 PM           To: [facility] Admin Pool            Please schedule appt, then return to clinical pool we will call pt and give her the information and instructions.  Thanks       ----- Message -----         From: Lavonia Drafts, MD         Sent: 06/22/2013   1:03 PM           To: [facility] Clinical Pool            Please call pt.  She needs a LEEP.  We can try to do it in the ofc HOWEVER, pt MUST take her Ativan prior to her visit.            Thx,      clh-S             ------

## 2013-07-05 ENCOUNTER — Other Ambulatory Visit: Payer: Self-pay | Admitting: Family Medicine

## 2013-07-05 ENCOUNTER — Telehealth: Payer: Self-pay | Admitting: *Deleted

## 2013-07-05 NOTE — Telephone Encounter (Signed)
Prior authorization form for [employer] [facility] placed in MD box for completion.

## 2013-07-05 NOTE — Telephone Encounter (Signed)
Completed form faxed to number provided.

## 2013-07-12 ENCOUNTER — Encounter: Payer: Self-pay | Admitting: *Deleted

## 2013-07-12 ENCOUNTER — Encounter: Payer: Self-pay | Admitting: Family Medicine

## 2013-07-12 ENCOUNTER — Ambulatory Visit (INDEPENDENT_AMBULATORY_CARE_PROVIDER_SITE_OTHER): Payer: PRIVATE HEALTH INSURANCE | Admitting: Family Medicine

## 2013-07-12 VITALS — BP 128/81 | HR 76 | Temp 98.3°F | Ht 66.0 in | Wt 143.4 lb

## 2013-07-12 DIAGNOSIS — N183 Chronic kidney disease, stage 3 unspecified: Secondary | ICD-10-CM

## 2013-07-12 DIAGNOSIS — E559 Vitamin D deficiency, unspecified: Secondary | ICD-10-CM

## 2013-07-12 DIAGNOSIS — K5090 Crohn's disease, unspecified, without complications: Secondary | ICD-10-CM

## 2013-07-12 NOTE — Patient Instructions (Signed)
Make sure you keep your appointment to follow the pap smear problem I will call with lab results and tell you whether you need to stay on the vit D and Magnesium [name] [name] will work on your ostomy supplies. You can stop taking the protonix

## 2013-07-13 ENCOUNTER — Encounter: Payer: Self-pay | Admitting: Family Medicine

## 2013-07-13 LAB — BASIC METABOLIC PANEL
BUN: 19 mg/dL (ref 6–23)
CALCIUM: 9.7 mg/dL (ref 8.4–10.5)
CO2: 16 meq/L — AB (ref 19–32)
CREATININE: 2.06 mg/dL — AB (ref 0.50–1.10)
Chloride: 108 mEq/L (ref 96–112)
GLUCOSE: 82 mg/dL (ref 70–99)
Potassium: 3.6 mEq/L (ref 3.5–5.3)
Sodium: 135 mEq/L (ref 135–145)

## 2013-07-13 LAB — CBC
HCT: 34.3 % — ABNORMAL LOW (ref 36.0–46.0)
Hemoglobin: 11.4 g/dL — ABNORMAL LOW (ref 12.0–15.0)
MCH: 30.6 pg (ref 26.0–34.0)
MCHC: 33.2 g/dL (ref 30.0–36.0)
MCV: 92 fL (ref 78.0–100.0)
Platelets: 187 10*3/uL (ref 150–400)
RBC: 3.73 MIL/uL — ABNORMAL LOW (ref 3.87–5.11)
RDW: 14.8 % (ref 11.5–15.5)
WBC: 6.3 10*3/uL (ref 4.0–10.5)

## 2013-07-13 LAB — VITAMIN D 25 HYDROXY (VIT D DEFICIENCY, FRACTURES): Vit D, 25-Hydroxy: 32 ng/mL (ref 30–89)

## 2013-07-13 LAB — MAGNESIUM: Magnesium: 1.5 mg/dL (ref 1.5–2.5)

## 2013-07-13 NOTE — Assessment & Plan Note (Signed)
Asymptomatic.  Will check CBC

## 2013-07-13 NOTE — Progress Notes (Signed)
   Subjective:    Patient ID: [name] [name], female    DOB: [birth year], 63 y.o.   MRN: [id number]  HPI  Feels OK.  No complaints.  Needs several refills.  Wants to know if still needs to take Mag and vit D.  Has been seen by gyn, had colpo and biopsy.  Leep planned.  I reinforced the importance of follow up.      Review of Systems     Objective:   Physical Exam Lungs clear Abd benign        Assessment & Plan:

## 2013-07-13 NOTE — Assessment & Plan Note (Signed)
Appears chronic yet controled on current supplemental Mag.  Continue.

## 2013-07-13 NOTE — Assessment & Plan Note (Signed)
Resolved.  OK to DC vit D

## 2013-07-17 ENCOUNTER — Encounter: Payer: Self-pay | Admitting: Obstetrics & Gynecology

## 2013-07-17 ENCOUNTER — Ambulatory Visit (INDEPENDENT_AMBULATORY_CARE_PROVIDER_SITE_OTHER): Payer: PRIVATE HEALTH INSURANCE | Admitting: Obstetrics & Gynecology

## 2013-07-17 ENCOUNTER — Other Ambulatory Visit (HOSPITAL_COMMUNITY)
Admission: RE | Admit: 2013-07-17 | Discharge: 2013-07-17 | Disposition: A | Discharge status: Home / Self Care | Payer: PRIVATE HEALTH INSURANCE | Source: Ambulatory Visit | Attending: Obstetrics & Gynecology | Admitting: Obstetrics & Gynecology

## 2013-07-17 VITALS — BP 123/79 | HR 89 | Temp 98.0°F | Ht 66.0 in | Wt 143.5 lb

## 2013-07-17 DIAGNOSIS — IMO0002 Reserved for concepts with insufficient information to code with codable children: Secondary | ICD-10-CM

## 2013-07-17 DIAGNOSIS — D069 Carcinoma in situ of cervix, unspecified: Secondary | ICD-10-CM | POA: Insufficient documentation

## 2013-07-17 DIAGNOSIS — R87611 Atypical squamous cells cannot exclude high grade squamous intraepithelial lesion on cytologic smear of cervix (ASC-H): Secondary | ICD-10-CM

## 2013-07-17 NOTE — Patient Instructions (Signed)
Loop Electrosurgical Excision Procedure Loop electrosurgical excision procedure (LEEP) is the removal of a portion of the lower part of the uterus (cervix). The procedure is done when there are significantly abnormal cervical cell changes. Abnormal cell changes of the cervix can lead to cancer if left in place and untreated.  The LEEP procedure itself typically only takes a few minutes. Often, it may be done in your caregiver's office. The procedure is considered safe for those who wish to get pregnant or are trying to get pregnant. Only under rare circumstances should this procedure be done if you are pregnant. LET YOUR CAREGIVER KNOW ABOUT:  Whether you are pregnant or late for your last menstrual period.  Allergies to foods or medicines.  All the medicines you are taking includingherbs, eyedrops, and over-the-counter medicines, and creams.  Use of steroids (by mouth or creams).  Previous problems with anesthetics or numbing medicine.  Previous gynecological surgery.  History of blood clots or bleeding problems.  Any recent or current vaginal infections (herpes, sexually transmitted infections).  Other health problems. RISKS AND COMPLICATIONS  Bleeding.  Infection.  Injury to the vagina, bladder, or rectum.  Very rare obstruction of the cervical opening that causes problems during menstruation (cervical stenosis). BEFORE THE PROCEDURE  Do not take aspirin or blood thinners (anticoagulants) for 1 week before the procedure, or as told by your caregiver.  Eat a light meal before the procedure.  Ask your caregiver about changing or stopping your regular medicines.  You may be given a pain reliever 1 or 2 hours before the procedure. PROCEDURE   A tool (speculum) is placed in the vagina. This allows your caregiver to see the cervix.  An iodine stain is applied to the cervix to find the area of abnormal cells to be removed.  Medicine is injected to numb the cervix (local  anesthetic).   Electricity is passed through a thin wire loop which is then used to remove (cauterize) a small segment of the affected cervix.  Light electrocautery is used to seal any small blood vessels and prevent bleeding.  A paste may be applied to the cauterized area of the cervix to help prevent bleeding.  The tissue sample is sent to the lab. It is examined under the microscope. AFTER THE PROCEDURE  Have someone drive you home.  You may have slight to moderate cramping.  You may notice a black vaginal discharge from the paste used on the cervix to prevent bleeding. This is normal.  Watch for excessive bleeding. This requires immediate medical care.  Ask when your test results will be ready. Make sure you get your test results. Document Released: 09/12/2002 Document Revised: 09/14/2011 Document Reviewed: 12/02/2010 [facility] Patient Information 2014 [place], [place]. Loop Electrosurgical Excision Procedure Care After Refer to this sheet in the next few weeks. These instructions provide you with information on caring for yourself after your procedure. Your caregiver may also give you more specific instructions. Your treatment has been planned according to current medical practices, but problems sometimes occur. Call your caregiver if you have any problems or questions after your procedure. HOME CARE INSTRUCTIONS   Do not use tampons, douche, or have sexual intercourse for 2 weeks or as directed by your caregiver.  Begin normal activities if you have no or minimal cramping or bleeding, unless directed otherwise by your caregiver.  Take your temperature if you feel sick. Write down your temperature on paper, and tell your caregiver if you have a fever.  Take all  medicines as directed by your caregiver.  Keep all your follow-up appointments and Pap tests as directed by your caregiver. SEEK IMMEDIATE MEDICAL CARE IF:   You have bleeding that is heavier or longer than a normal  menstrual cycle.  You have bleeding that is bright red.  You have blood clots.  You have a fever.  You have increasing cramps or pain not relieved by medicine.  You develop abdominal pain that does not seem to be related to the same area of earlier cramping and pain.  You are lightheaded, unusually weak, or faint.  You develop painful or bloody urination.  You develop a bad smelling vaginal discharge. MAKE SURE YOU:  Understand these instructions.  Will watch your condition.  Will get help right away if you are not doing well or get worse. Document Released: 03/05/2011 Document Revised: 09/14/2011 Document Reviewed: 03/05/2011 [facility] Patient Information 2014 [place].

## 2013-07-17 NOTE — Progress Notes (Signed)
Patient ID: [name] [name], female   DOB: [birth year], 63 y.o.   MRN: [id number]  Pap smear and colposcopy reviewed.   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 Colpo Biopsy 12/2/2014Diagnosis 1. Cervix, biopsy, 5,6,11 o'clock on the cervix - LOW GRADE SQUAMOUS INTRAEPITHELIAL LESION, CIN-I (MILD DYSPLASIA). - BACKGROUND CHRONIC INFLAMMATION. 2. Endocervix, curettage - DETACHED FRAGMENTS OF DYSPLASTIC SQUAMOUS EPITHELIUM, CONSISTENT WITH HIGH GRADE SQUAMOUS INTRAEPITHELIAL LESION, CIN-III (SEVERE DYSPLASIA/CIS).  Risks, benefits, alternatives, and limitations of procedure explained to patient, including pain, bleeding, infection, failure to remove abnormal tissue and failure to cure dysplasia, need for repeat procedures, damage to pelvic organs, cervical incompetence.  Role of HPV,cervical dysplasia and need for close followup was empasized. Informed written consent was obtained. All questions were answered. Time out performed.  Procedure: The patient was placed in lithotomy position and the bivalved coated speculum was placed in the patient's vagina. A grounding pad placed on the patient. Local anesthesia was administered via an intracervical block using 10cc of 2% Lidocaine with epinephrine. The suction was turned on and the Large 1X Fisher Cone Biopsy Excisor on 69 Watts of cutting current was used to excise the area of decreased uptake and excise the entire transformation zone. Excellent hemostasis was achieved using roller ball coagulation set at 60 Watts coagulation current. Monsel's solution was then applied and excellent hemostasis was noted.  The speculum was removed from the vagina. Specimens were sent to pathology. The patient tolerated the procedure well. Post-operative instructions given to patient, including instruction to seek medical attention for persistent bright red bleeding, fever, abdominal/pelvic pain,  dysuria, nausea or vomiting. She was also told about the possibility of having copious yellow to black tinged discharge. She was counseled to avoid anything in the vagina (sex/douching/tampons) for 4 weeks. She has a 4 week post-operative check to review results and assess wound healing. Follow up in 6 months for repeat pap or as needed.

## 2013-07-18 ENCOUNTER — Telehealth: Payer: Self-pay | Admitting: Family Medicine

## 2013-07-18 NOTE — Telephone Encounter (Signed)
Patient needs a letter stating that she can have (in her best interest) her granddaughter and her child stay with patient due to her having anxiety. This letter will be picked up by her.

## 2013-07-18 NOTE — Telephone Encounter (Signed)
Pt called and would like Dr. Andria Frames to call her concerning a personal matter. jw

## 2013-07-19 NOTE — Telephone Encounter (Signed)
See letter.

## 2013-07-19 NOTE — Telephone Encounter (Signed)
Informed of below

## 2013-07-21 ENCOUNTER — Telehealth: Payer: Self-pay

## 2013-07-21 NOTE — Telephone Encounter (Signed)
Pt called and stated that she has had surgery and she needs to ask Dr. Ihor Dow a question about some light bleeding she is having.

## 2013-07-21 NOTE — Telephone Encounter (Signed)
Patient called [facility]--has question about "dark spotting" that she is having post-procedure.  Reviewed post-op notes from [facility] procedure on 07/17/13.  Per d/c instructions--"Post-operative instructions given to patient, including instruction to seek medical attention for persistent bright red bleeding, fever, abdominal/pelvic pain, dysuria, nausea or vomiting. She was also told about the possibility of having copious yellow to black tinged discharge. She was counseled to avoid anything in the vagina (sex/douching/tampons) for 4 weeks."  Patient denied having any bright red bleeding, fever, abd pain, dysuria, or N & V.  Informed patient that dark spotting she is having is part of healing process and she can wear pad/panty liner.  Also, informed she may receive call back from [facility] with similar advice.  Nolene Ebbs, RN

## 2013-07-26 ENCOUNTER — Encounter: Payer: Self-pay | Admitting: *Deleted

## 2013-08-03 NOTE — Telephone Encounter (Signed)
Called pt and pt informed me that she is having a dark blood and some spotting after a colpo.  I advised pt that they symptoms she is having is normal and that her body is healing from the biopsies.  Pt stated understanding and that she was not having any bleed now.

## 2013-08-07 ENCOUNTER — Other Ambulatory Visit: Payer: Self-pay | Admitting: Family Medicine

## 2013-08-07 MED ORDER — MAGNESIUM OXIDE 400 (241.3 MG) MG PO TABS: 400.0000 mg | ORAL_TABLET | Freq: Every day | ORAL | Status: DC

## 2013-08-14 ENCOUNTER — Encounter: Payer: Self-pay | Admitting: Obstetrics & Gynecology

## 2013-08-14 ENCOUNTER — Ambulatory Visit (INDEPENDENT_AMBULATORY_CARE_PROVIDER_SITE_OTHER): Payer: PRIVATE HEALTH INSURANCE | Admitting: Obstetrics & Gynecology

## 2013-08-14 VITALS — BP 136/77 | HR 88 | Temp 98.7°F | Ht 66.0 in | Wt 143.1 lb

## 2013-08-14 DIAGNOSIS — N879 Dysplasia of cervix uteri, unspecified: Secondary | ICD-10-CM

## 2013-08-14 NOTE — Progress Notes (Signed)
Subjective:     Patient ID: [name] [name], female   DOB: 07-12-1950, 63 y.o.   MRN: [id number]  HPI Pt with no complaints.  She denies pain or bleeding.    Review of Systems     Objective:   Physical Exam BP 136/77  Pulse 88  Temp(Src) 98.7 F (37.1 C)  Ht 5' 6"  (1.676 m)  Wt 143 lb 1.6 oz (64.91 kg)  BMI 23.11 kg/m2 Pt in NAD GU: EGBUS: no lesions Vagina: no blood in vault Cervix: no lesion; no mucopurulent d/c; well heeled    07/17/2013 Diagnosis Cervix, LEEP - HIGH GRADE SQUAMOUS INTRAEPITHELIAL LESION, CIN-III (SEVERE DYSPLASIA/CIS), SEE COMMENT. Microscopic Comment High grade squamous dysplasia is focally identified. Although there is a focus of freely floating high grade dysplasia adjacent to endocervical margin, there is no additional dysplasia present at cauterized excisional tissue. The finding is of uncertain significance. Regardless, the specimen is submitted in multiple fragments and correlation with clinical impression of lesion excision is required. (CR:kh 07/19/13)    Assessment:     Post LEEP check- margins clear      Plan:     F/u in 6 months for repeat check

## 2013-08-17 ENCOUNTER — Other Ambulatory Visit: Payer: Self-pay | Admitting: Family Medicine

## 2013-08-19 ENCOUNTER — Other Ambulatory Visit: Payer: Self-pay | Admitting: Family Medicine

## 2013-08-28 ENCOUNTER — Ambulatory Visit: Payer: PRIVATE HEALTH INSURANCE | Admitting: Cardiovascular Disease

## 2013-09-05 ENCOUNTER — Encounter: Payer: Self-pay | Admitting: Cardiovascular Disease

## 2013-09-06 ENCOUNTER — Other Ambulatory Visit: Payer: Self-pay

## 2013-09-06 DIAGNOSIS — Z1231 Encounter for screening mammogram for malignant neoplasm of breast: Secondary | ICD-10-CM

## 2013-09-21 ENCOUNTER — Ambulatory Visit
Admission: RE | Admit: 2013-09-21 | Discharge: 2013-09-21 | Disposition: A | Discharge status: Home / Self Care | Payer: PRIVATE HEALTH INSURANCE | Source: Ambulatory Visit

## 2013-09-21 DIAGNOSIS — Z1231 Encounter for screening mammogram for malignant neoplasm of breast: Secondary | ICD-10-CM

## 2013-09-28 ENCOUNTER — Ambulatory Visit (INDEPENDENT_AMBULATORY_CARE_PROVIDER_SITE_OTHER): Payer: PRIVATE HEALTH INSURANCE | Admitting: Cardiovascular Disease

## 2013-09-28 ENCOUNTER — Encounter: Payer: Self-pay | Admitting: Cardiovascular Disease

## 2013-09-28 VITALS — BP 110/66 | HR 80 | Ht 66.0 in | Wt 141.8 lb

## 2013-09-28 DIAGNOSIS — E785 Hyperlipidemia, unspecified: Secondary | ICD-10-CM

## 2013-09-28 DIAGNOSIS — I5022 Chronic systolic (congestive) heart failure: Secondary | ICD-10-CM

## 2013-09-28 NOTE — Progress Notes (Signed)
[name] [name] Date of Birth  03/06/1951 [facility] [address]    [place], [place]  [address] [phone]  Fax  [phone]  Problems: 1. Congestive heart failure-EF of 30-35%,  EF has now improved Left ventricle: 12/07/2011-- The cavity size was normal. Wall thickness was normal. Systolic function was normal. The estimated ejection fraction was in the range of 60% to 65%. - Aortic valve: Trivial regurgitation. - Mitral valve: Moderate regurgitation.  12/17/2011-normal Myoview study.   Left ventricular systolic function is normal with EF of 58% to   normal myoview in May , 2012 1. Chronic renal insufficiency 2. Chronic anemia   History of Present Illness:  This is a 63 year old female with a  diagnosis of congestive heart failure.   She's been on medical therapy and her ejection fraction has increased from 35% up to 60-65%.  She has done well from a medical standpoint.  No dyspnea. No CP.  October 18, 2013:  She is doing well .  No CP or dyspnea.  She had some [facility]. Her creatinine is up to 1.9.    September 28, 2013:  She's done well from a cardiac standpoint. No chest pains or shortness of breath. She's able to do all of her normal activities without difficulty. She has a history of chronic kidney disease. And is now up to 2.0 which is only minimally higher than it was at her last check.   Current Outpatient Prescriptions on File Prior to Visit  Medication Sig Dispense Refill  . acetaminophen (TYLENOL) 500 MG tablet Take 1,000 mg by mouth every 6 (six) hours as needed for pain.      . calcium citrate (CALCITRATE - DOSED IN MG ELEMENTAL CALCIUM) 950 MG tablet Take 1 tablet (200 mg of elemental calcium total) by mouth 2 (two) times daily.  60 tablet  1  . citalopram (CELEXA) 20 MG tablet take 1 tablet by mouth once daily  90 tablet  3  . clopidogrel (PLAVIX) 75 MG tablet Take 1 tablet (75 mg total) by mouth daily with  breakfast.  30 tablet  6  . LORazepam (ATIVAN) 0.5 MG tablet Take 0.5 mg by mouth 2 (two) times daily as needed for anxiety.      . magnesium oxide (MAG-OX) 400 (241.3 MG) MG tablet Take 1 tablet (400 mg total) by mouth daily.  30 tablet  6  . Mesalamine 800 MG TBEC Take 1 tablet (800 mg total) by mouth 3 (three) times daily.  180 tablet  5  . metoprolol succinate (TOPROL-XL) 50 MG 24 hr tablet Take 25 mg by mouth daily. Take with or immediately following a meal.      . simvastatin (ZOCOR) 20 MG tablet take 1 tablet by mouth at bedtime  90 tablet  3   No current facility-administered medications on file prior to visit.    Allergies  Allergen Reactions  . Amoxicillin Anaphylaxis and Rash    REACTION:Throat Swelling  . Aspirin Anaphylaxis, Itching and Rash  . Morphine And Related Anaphylaxis  . Penicillins Anaphylaxis and Rash  . Ace Inhibitors Cough    ACE possibly associated with cough and switched to ARB.  Would be OK to re-challenge if needed.  . Gabapentin     Patient had one time seizure shortly after stopping gabapentin    Past Medical History  Diagnosis Date  . Crohn's disease   . Hypertension   . Hyperlipidemia   .  CHF (congestive heart failure)     EF 30-35% 2012->EF 60-65% 2013  . GERD (gastroesophageal reflux disease)   . 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 Anxiety   . Panic attacks   . CKD (chronic kidney disease), stage III   . Anemia, chronic renal failure   . Asthma 05/2011  . Headache(784.0)     "related to high BP" (05/30/2012)  . Tobacco abuse   . Abnormal chest CT     Coronary atherosclerosis on chest CT 2012    Past Surgical History  Procedure Laterality Date  . 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 Ectopic pregnancy surgery  ?1980's    left  . Cholecystectomy  01/28/2005  . Cataract extraction w/ intraocular lens  implant, bilateral  ~ 2000  . Ileostomy  ?  2002    History  Smoking status  . Current Every Day Smoker -- 0.12 packs/day for 18  years  . Types: Cigarettes  Smokeless tobacco  . Never Used    Comment: USES E CIGARETTE    History  Alcohol Use No    Comment: 05/30/2012 "used to drink back in the day; last alcohol 23 yr ago"    Family History  Problem Relation Age of Onset  . Heart disease Mother   . Stroke Father     Reviw of Systems:  Reviewed in the HPI.  All other systems are negative.  Physical Exam: BP 110/66  Pulse 80  Ht 5' 6"  (1.676 m)  Wt 141 lb 12.8 oz (64.32 kg)  BMI 22.90 kg/m2 The patient is alert and oriented x 3.  The mood and affect are normal.   Skin: warm and dry.  Color is normal.    HEENT:   Normocephalic/atraumatic. She has normal carotids. There is no JVD.  Lungs: Lung exam is clear.   Heart: Regular rate S1-S2.    Abdomen: She has colostomy on her left side.  +BS. nontender  Extremities:  She has no clubbing cyanosis or edema.  Neuro:  There exam is nonfocal.    ECG:  Assessment / Plan:

## 2013-09-28 NOTE — Assessment & Plan Note (Addendum)
[name] [name]  is doing very well. Her left ventricular systolic function has improved significantly over the years on medical therapy. She's not having any limitations. She denies any chest pain or shortness breath.  Because of her kidney disease, we have not performed a cardiac catheterization but she's improved dramatically. In addition, she has a normal Myoview study in June, 2013. It does not appear that she has significant coronary artery disease.  We'll continue with her same medications. I'll see her again in one year for a followup office visit.

## 2013-09-28 NOTE — Patient Instructions (Signed)
Your physician recommends that you return for lab work in: [place] recommends that you return for a FASTING lipid profile: [place] wants you to follow-up in: [place] will receive a reminder letter in the mail two months in advance. If you don't receive a letter, please call our office to schedule the follow-up appointment.]

## 2013-09-29 ENCOUNTER — Other Ambulatory Visit: Payer: PRIVATE HEALTH INSURANCE

## 2013-09-29 LAB — BASIC METABOLIC PANEL
BUN: 40 mg/dL — ABNORMAL HIGH (ref 6–23)
CALCIUM: 9.9 mg/dL (ref 8.4–10.5)
CO2: 15 mEq/L — ABNORMAL LOW (ref 19–32)
CREATININE: 2.1 mg/dL — AB (ref 0.4–1.2)
Chloride: 112 mEq/L (ref 96–112)
GFR: 30.69 mL/min — AB (ref >60.00)
Glucose, Bld: 101 mg/dL — ABNORMAL HIGH (ref 70–99)
Potassium: 4.5 mEq/L (ref 3.5–5.1)
Sodium: 135 mEq/L (ref 135–145)

## 2013-09-29 LAB — LIPID PANEL
Cholesterol: 173 mg/dL (ref 0–200)
HDL: 36.8 mg/dL — ABNORMAL LOW (ref >39.00)
LDL Cholesterol: 92 mg/dL (ref 0–99)
Total CHOL/HDL Ratio: 5
Triglycerides: 219 mg/dL — ABNORMAL HIGH (ref 0.0–149.0)
VLDL: 43.8 mg/dL — ABNORMAL HIGH (ref 0.0–40.0)

## 2013-09-29 LAB — HEPATIC FUNCTION PANEL
ALBUMIN: 4.1 g/dL (ref 3.5–5.2)
ALT: 25 U/L (ref 0–35)
AST: 20 U/L (ref 0–37)
Alkaline Phosphatase: 120 U/L — ABNORMAL HIGH (ref 39–117)
Bilirubin, Direct: 0 mg/dL (ref 0.0–0.3)
Total Bilirubin: 0.3 mg/dL (ref 0.3–1.2)
Total Protein: 8.1 g/dL (ref 6.0–8.3)

## 2013-10-18 ENCOUNTER — Ambulatory Visit: Payer: PRIVATE HEALTH INSURANCE | Admitting: Family Medicine

## 2013-11-02 ENCOUNTER — Other Ambulatory Visit: Payer: Self-pay | Admitting: Family Medicine

## 2013-11-02 NOTE — Telephone Encounter (Signed)
Called refill

## 2013-11-03 ENCOUNTER — Ambulatory Visit: Payer: PRIVATE HEALTH INSURANCE | Admitting: Family Medicine

## 2013-11-28 IMAGING — CR DG CHEST 1V PORT
1 series · 1 of 1 positions shown · non-contrast
Comparison: Chest x-ray 05/08/2011.

CLINICAL DATA: Chest pain.

PORTABLE CHEST - 1 VIEW

[AP]
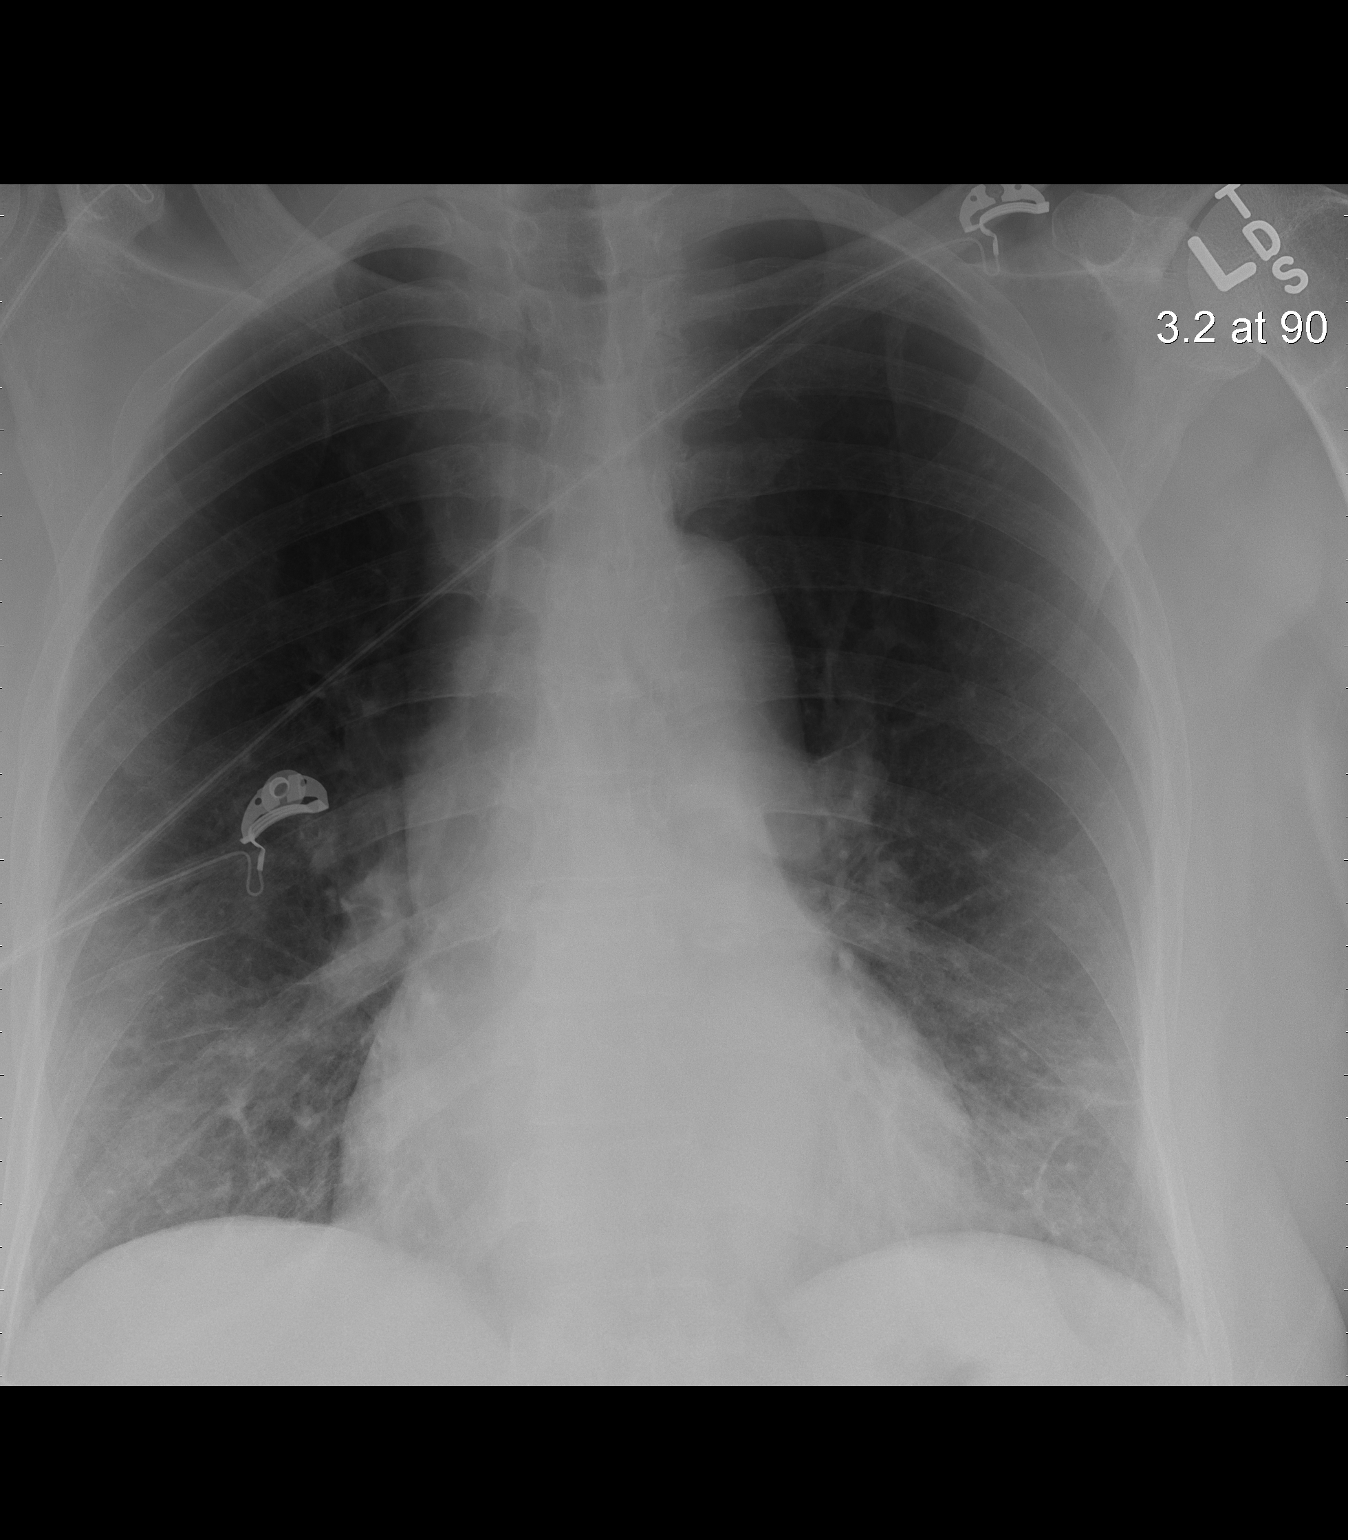

[1 of 1 positions shown; findings below may reference images not displayed]

FINDINGS: The cardiac silhouette, mediastinal and hilar contours
are stable.  There are chronic emphysematous and bronchitic type
lung changes likely related to smoking.  There is a pulmonary
scarring are stable.  No definite acute overlying pulmonary
process.
IMPRESSION: Chronic emphysematous and bronchitic type lung changes with
bibasilar compressive atelectasis but no infiltrates, edema or
effusions.

## 2013-12-27 ENCOUNTER — Ambulatory Visit: Payer: PRIVATE HEALTH INSURANCE | Admitting: Family Medicine

## 2014-01-18 ENCOUNTER — Other Ambulatory Visit: Payer: Self-pay | Admitting: Family Medicine

## 2014-01-19 ENCOUNTER — Ambulatory Visit (INDEPENDENT_AMBULATORY_CARE_PROVIDER_SITE_OTHER): Payer: PRIVATE HEALTH INSURANCE | Admitting: Family Medicine

## 2014-01-19 ENCOUNTER — Encounter: Payer: Self-pay | Admitting: Family Medicine

## 2014-01-19 VITALS — BP 121/67 | HR 76 | Temp 98.2°F | Ht 66.0 in | Wt 140.4 lb

## 2014-01-19 DIAGNOSIS — N183 Chronic kidney disease, stage 3 unspecified: Secondary | ICD-10-CM

## 2014-01-19 DIAGNOSIS — I2510 Atherosclerotic heart disease of native coronary artery without angina pectoris: Secondary | ICD-10-CM

## 2014-01-19 DIAGNOSIS — F17200 Nicotine dependence, unspecified, uncomplicated: Secondary | ICD-10-CM

## 2014-01-19 DIAGNOSIS — K5090 Crohn's disease, unspecified, without complications: Secondary | ICD-10-CM

## 2014-01-19 DIAGNOSIS — Z720 Tobacco use: Secondary | ICD-10-CM

## 2014-01-19 DIAGNOSIS — E559 Vitamin D deficiency, unspecified: Secondary | ICD-10-CM

## 2014-01-19 LAB — RENAL FUNCTION PANEL
ALBUMIN: 4 g/dL (ref 3.5–5.2)
BUN: 23 mg/dL (ref 6–23)
CALCIUM: 9.4 mg/dL (ref 8.4–10.5)
CO2: 19 meq/L (ref 19–32)
Chloride: 110 mEq/L (ref 96–112)
Creat: 1.72 mg/dL — ABNORMAL HIGH (ref 0.50–1.10)
Glucose, Bld: 84 mg/dL (ref 70–99)
Phosphorus: 3.2 mg/dL (ref 2.3–4.6)
Potassium: 3.7 mEq/L (ref 3.5–5.3)
Sodium: 136 mEq/L (ref 135–145)

## 2014-01-19 LAB — MAGNESIUM: MAGNESIUM: 1.7 mg/dL (ref 1.5–2.5)

## 2014-01-19 MED ORDER — CLOPIDOGREL BISULFATE 75 MG PO TABS: 75.0000 mg | ORAL_TABLET | Freq: Every day | ORAL | Status: DC

## 2014-01-19 MED ORDER — ZOSTER VACCINE LIVE 19400 UNT/0.65ML ~~LOC~~ SOLR: 0.6500 mL | Freq: Once | SUBCUTANEOUS | Status: DC

## 2014-01-19 MED ORDER — METOPROLOL SUCCINATE ER 25 MG PO TB24: 25.0000 mg | ORAL_TABLET | Freq: Every day | ORAL | Status: DC

## 2014-01-19 NOTE — Patient Instructions (Signed)
I sent in refills for your metoprolol and your clopidigrel. I also sent a prescription for the shingles vaccine to your pharmacy.  Let me know when you get it so that I can update your records. The refill for the Asacol should already be there Your mammogram was great.  Get another one next year Stay on all your same medications. I will call Monday to let you know how the kidney blood test came out. See me in 6 months.   Sooner if problems. Make sure to keep your appointment with [facility].

## 2014-01-19 NOTE — Assessment & Plan Note (Signed)
Asymptomatic.  Check labs.

## 2014-01-19 NOTE — Progress Notes (Signed)
   Subjective:    Patient ID: [name] [name], female    DOB: [birth year], 63 y.o.   MRN: [id number]  HPI   Feels well.  She had leep procedure by gyn and will be due for FU next month.   Normal mammo results given.   Had questions about meds - specifically, should she continue her plavix.  Yes.   Needs recheck of her renal function.   Needs refill on Asacol Now that she is 62, due for zostavax. States crohns under good control without pain, bleeding or diarrhea.    Review of Systems     Objective:   Physical ExamLungs clear Cardiac RRR without m or g Abd benign        Assessment & Plan:

## 2014-01-19 NOTE — Assessment & Plan Note (Signed)
Check labs 

## 2014-01-19 NOTE — Assessment & Plan Note (Addendum)
No chest pain.  Refill plavix check lipids.

## 2014-01-19 NOTE — Assessment & Plan Note (Signed)
Doing well on Asachol.  Refill

## 2014-01-19 NOTE — Assessment & Plan Note (Signed)
Recheck labs 

## 2014-01-19 NOTE — Assessment & Plan Note (Signed)
Advised to quit

## 2014-01-20 LAB — VITAMIN D 25 HYDROXY (VIT D DEFICIENCY, FRACTURES): Vit D, 25-Hydroxy: 24 ng/mL — ABNORMAL LOW (ref 30–89)

## 2014-04-19 ENCOUNTER — Emergency Department (HOSPITAL_COMMUNITY): Payer: PRIVATE HEALTH INSURANCE

## 2014-04-19 ENCOUNTER — Inpatient Hospital Stay (HOSPITAL_COMMUNITY)
Admission: EM | Admit: 2014-04-19 | Discharge: 2014-04-25 | DRG: 872 | Disposition: A | Discharge status: Home / Self Care | Payer: PRIVATE HEALTH INSURANCE | Attending: Family Medicine | Admitting: Family Medicine

## 2014-04-19 ENCOUNTER — Encounter (HOSPITAL_COMMUNITY): Payer: Self-pay | Admitting: Emergency Medicine

## 2014-04-19 DIAGNOSIS — E871 Hypo-osmolality and hyponatremia: Secondary | ICD-10-CM | POA: Diagnosis present

## 2014-04-19 DIAGNOSIS — Z8249 Family history of ischemic heart disease and other diseases of the circulatory system: Secondary | ICD-10-CM

## 2014-04-19 DIAGNOSIS — K5090 Crohn's disease, unspecified, without complications: Secondary | ICD-10-CM | POA: Diagnosis present

## 2014-04-19 DIAGNOSIS — F17210 Nicotine dependence, cigarettes, uncomplicated: Secondary | ICD-10-CM | POA: Diagnosis present

## 2014-04-19 DIAGNOSIS — I2510 Atherosclerotic heart disease of native coronary artery without angina pectoris: Secondary | ICD-10-CM | POA: Diagnosis present

## 2014-04-19 DIAGNOSIS — Z880 Allergy status to penicillin: Secondary | ICD-10-CM

## 2014-04-19 DIAGNOSIS — N183 Chronic kidney disease, stage 3 unspecified: Secondary | ICD-10-CM

## 2014-04-19 DIAGNOSIS — Z888 Allergy status to other drugs, medicaments and biological substances status: Secondary | ICD-10-CM

## 2014-04-19 DIAGNOSIS — Z885 Allergy status to narcotic agent status: Secondary | ICD-10-CM | POA: Diagnosis not present

## 2014-04-19 DIAGNOSIS — Z9841 Cataract extraction status, right eye: Secondary | ICD-10-CM | POA: Diagnosis not present

## 2014-04-19 DIAGNOSIS — Z79899 Other long term (current) drug therapy: Secondary | ICD-10-CM

## 2014-04-19 DIAGNOSIS — E785 Hyperlipidemia, unspecified: Secondary | ICD-10-CM | POA: Diagnosis present

## 2014-04-19 DIAGNOSIS — D631 Anemia in chronic kidney disease: Secondary | ICD-10-CM

## 2014-04-19 DIAGNOSIS — Z9842 Cataract extraction status, left eye: Secondary | ICD-10-CM | POA: Diagnosis not present

## 2014-04-19 DIAGNOSIS — Z7902 Long term (current) use of antithrombotics/antiplatelets: Secondary | ICD-10-CM | POA: Diagnosis not present

## 2014-04-19 DIAGNOSIS — K1121 Acute sialoadenitis: Secondary | ICD-10-CM | POA: Diagnosis not present

## 2014-04-19 DIAGNOSIS — Z961 Presence of intraocular lens: Secondary | ICD-10-CM | POA: Diagnosis present

## 2014-04-19 DIAGNOSIS — K1120 Sialoadenitis, unspecified: Secondary | ICD-10-CM | POA: Diagnosis present

## 2014-04-19 DIAGNOSIS — E876 Hypokalemia: Secondary | ICD-10-CM | POA: Diagnosis not present

## 2014-04-19 DIAGNOSIS — J45909 Unspecified asthma, uncomplicated: Secondary | ICD-10-CM | POA: Diagnosis present

## 2014-04-19 DIAGNOSIS — J449 Chronic obstructive pulmonary disease, unspecified: Secondary | ICD-10-CM

## 2014-04-19 DIAGNOSIS — Z823 Family history of stroke: Secondary | ICD-10-CM | POA: Diagnosis not present

## 2014-04-19 DIAGNOSIS — I129 Hypertensive chronic kidney disease with stage 1 through stage 4 chronic kidney disease, or unspecified chronic kidney disease: Secondary | ICD-10-CM | POA: Diagnosis present

## 2014-04-19 DIAGNOSIS — Z933 Colostomy status: Secondary | ICD-10-CM | POA: Diagnosis not present

## 2014-04-19 DIAGNOSIS — R609 Edema, unspecified: Secondary | ICD-10-CM

## 2014-04-19 DIAGNOSIS — N179 Acute kidney failure, unspecified: Secondary | ICD-10-CM | POA: Diagnosis present

## 2014-04-19 DIAGNOSIS — N189 Chronic kidney disease, unspecified: Secondary | ICD-10-CM

## 2014-04-19 DIAGNOSIS — F419 Anxiety disorder, unspecified: Secondary | ICD-10-CM | POA: Diagnosis present

## 2014-04-19 DIAGNOSIS — A419 Sepsis, unspecified organism: Secondary | ICD-10-CM | POA: Diagnosis present

## 2014-04-19 DIAGNOSIS — E860 Dehydration: Secondary | ICD-10-CM

## 2014-04-19 DIAGNOSIS — E872 Acidosis: Secondary | ICD-10-CM | POA: Diagnosis present

## 2014-04-19 DIAGNOSIS — I959 Hypotension, unspecified: Secondary | ICD-10-CM

## 2014-04-19 LAB — BASIC METABOLIC PANEL
Anion gap: 18 — ABNORMAL HIGH (ref 5–15)
BUN: 26 mg/dL — ABNORMAL HIGH (ref 6–23)
CALCIUM: 10.1 mg/dL (ref 8.4–10.5)
CO2: 12 mEq/L — ABNORMAL LOW (ref 19–32)
Chloride: 103 mEq/L (ref 96–112)
Creatinine, Ser: 2.67 mg/dL — ABNORMAL HIGH (ref 0.50–1.10)
GFR calc Af Amer: 21 mL/min — ABNORMAL LOW (ref >90)
GFR, EST NON AFRICAN AMERICAN: 18 mL/min — AB (ref >90)
GLUCOSE: 110 mg/dL — AB (ref 70–99)
Potassium: 4.7 mEq/L (ref 3.7–5.3)
Sodium: 133 mEq/L — ABNORMAL LOW (ref 137–147)

## 2014-04-19 LAB — CBC WITH DIFFERENTIAL/PLATELET
BASOS ABS: 0 10*3/uL (ref 0.0–0.1)
BASOS PCT: 0 % (ref 0–1)
EOS PCT: 0 % (ref 0–5)
Eosinophils Absolute: 0 10*3/uL (ref 0.0–0.7)
HCT: 37.7 % (ref 36.0–46.0)
Hemoglobin: 12.5 g/dL (ref 12.0–15.0)
Lymphocytes Relative: 10 % — ABNORMAL LOW (ref 12–46)
Lymphs Abs: 2 10*3/uL (ref 0.7–4.0)
MCH: 31 pg (ref 26.0–34.0)
MCHC: 33.2 g/dL (ref 30.0–36.0)
MCV: 93.5 fL (ref 78.0–100.0)
Monocytes Absolute: 1.9 10*3/uL — ABNORMAL HIGH (ref 0.1–1.0)
Monocytes Relative: 9 % (ref 3–12)
Neutro Abs: 16.3 10*3/uL — ABNORMAL HIGH (ref 1.7–7.7)
Neutrophils Relative %: 81 % — ABNORMAL HIGH (ref 43–77)
Platelets: 191 10*3/uL (ref 150–400)
RBC: 4.03 MIL/uL (ref 3.87–5.11)
RDW: 14.7 % (ref 11.5–15.5)
WBC: 20.2 10*3/uL — ABNORMAL HIGH (ref 4.0–10.5)

## 2014-04-19 MED ORDER — CLOPIDOGREL BISULFATE 75 MG PO TABS
75.0000 mg | ORAL_TABLET | Freq: Every day | ORAL | Status: DC
  Administered 2014-04-20 – 2014-04-25 (×6): 75 mg via ORAL
  Filled 2014-04-19 (×7): qty 1

## 2014-04-19 MED ORDER — MESALAMINE 400 MG PO CPDR
800.0000 mg | DELAYED_RELEASE_CAPSULE | Freq: Three times a day (TID) | ORAL | Status: DC
  Administered 2014-04-20 – 2014-04-25 (×16): 800 mg via ORAL
  Filled 2014-04-19 (×18): qty 2

## 2014-04-19 MED ORDER — FENTANYL CITRATE 0.05 MG/ML IJ SOLN
50.0000 ug | INTRAMUSCULAR | Status: DC | PRN
  Administered 2014-04-20 – 2014-04-21 (×2): 50 ug via INTRAVENOUS
  Filled 2014-04-19 (×2): qty 2

## 2014-04-19 MED ORDER — ACETAMINOPHEN 325 MG PO TABS
650.0000 mg | ORAL_TABLET | Freq: Four times a day (QID) | ORAL | Status: DC | PRN
  Administered 2014-04-20 – 2014-04-22 (×3): 650 mg via ORAL
  Filled 2014-04-19 (×3): qty 2

## 2014-04-19 MED ORDER — HEPARIN SODIUM (PORCINE) 5000 UNIT/ML IJ SOLN
5000.0000 [IU] | Freq: Three times a day (TID) | INTRAMUSCULAR | Status: DC
  Administered 2014-04-20 – 2014-04-25 (×15): 5000 [IU] via SUBCUTANEOUS
  Filled 2014-04-19 (×19): qty 1

## 2014-04-19 MED ORDER — CLINDAMYCIN PHOSPHATE 600 MG/50ML IV SOLN
600.0000 mg | Freq: Four times a day (QID) | INTRAVENOUS | Status: DC
  Administered 2014-04-20 (×3): 600 mg via INTRAVENOUS
  Filled 2014-04-19 (×4): qty 50

## 2014-04-19 MED ORDER — SODIUM CHLORIDE 0.9 % IV SOLN
INTRAVENOUS | Status: DC
  Administered 2014-04-20: 01:00:00 via INTRAVENOUS

## 2014-04-19 MED ORDER — LORAZEPAM 0.5 MG PO TABS
0.5000 mg | ORAL_TABLET | Freq: Two times a day (BID) | ORAL | Status: DC | PRN
  Administered 2014-04-21 – 2014-04-23 (×5): 0.5 mg via ORAL
  Filled 2014-04-19 (×5): qty 1

## 2014-04-19 MED ORDER — ACETAMINOPHEN 650 MG RE SUPP: 650.0000 mg | Freq: Four times a day (QID) | RECTAL | Status: DC | PRN

## 2014-04-19 MED ORDER — ACETAMINOPHEN 650 MG RE SUPP
650.0000 mg | Freq: Once | RECTAL | Status: AC
  Administered 2014-04-19: 650 mg via RECTAL
  Filled 2014-04-19: qty 1

## 2014-04-19 MED ORDER — CITALOPRAM HYDROBROMIDE 20 MG PO TABS
20.0000 mg | ORAL_TABLET | Freq: Every day | ORAL | Status: DC
  Administered 2014-04-20 – 2014-04-25 (×6): 20 mg via ORAL
  Filled 2014-04-19 (×6): qty 1

## 2014-04-19 MED ORDER — ONDANSETRON HCL 4 MG/2ML IJ SOLN
4.0000 mg | Freq: Four times a day (QID) | INTRAMUSCULAR | Status: DC | PRN
Start: 2014-04-19 — End: 2014-04-25

## 2014-04-19 MED ORDER — CLINDAMYCIN PHOSPHATE 600 MG/50ML IV SOLN
600.0000 mg | Freq: Once | INTRAVENOUS | Status: AC
  Administered 2014-04-19: 600 mg via INTRAVENOUS
  Filled 2014-04-19: qty 50

## 2014-04-19 MED ORDER — ONDANSETRON HCL 4 MG PO TABS: 4.0000 mg | ORAL_TABLET | Freq: Four times a day (QID) | ORAL | Status: DC | PRN

## 2014-04-19 MED ORDER — SIMVASTATIN 20 MG PO TABS
20.0000 mg | ORAL_TABLET | Freq: Every day | ORAL | Status: DC
  Administered 2014-04-20 – 2014-04-25 (×6): 20 mg via ORAL
  Filled 2014-04-19 (×6): qty 1

## 2014-04-19 NOTE — ED Provider Notes (Signed)
CSN: [id number]     Arrival date & time 04/19/14  1346 History   First MD Initiated Contact with Patient 04/19/14 1734     Chief Complaint  Patient presents with  . dental abscess       Patient is a 63 y.o. female presenting with tooth pain. The history is provided by the patient.  Dental Pain Location:  Lower Quality:  Sharp Severity:  Severe Duration:  3 days Timing:  Constant Progression:  Worsening Chronicity:  New Relieved by:  Nothing Worsened by:  Pressure Associated symptoms: facial pain, facial swelling, fever and neck swelling   pt reports dental pain for 3 days with associated left facial/neck swelling She also reports fever No vomiting She denies difficulty swallowing  Past Medical History  Diagnosis Date  . Crohn's disease   . Hypertension   . Hyperlipidemia   . CHF (congestive heart failure)     EF 30-35% 2012->EF 60-65% 2013  . GERD (gastroesophageal reflux disease)   . 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 Anxiety   . Panic attacks   . CKD (chronic kidney disease), stage III   . Anemia, chronic renal failure   . Asthma 05/2011  . Headache(784.0)     "related to high BP" (05/30/2012)  . Tobacco abuse   . Abnormal chest CT     Coronary atherosclerosis on chest CT 2012   Past Surgical History  Procedure Laterality Date  . 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 Ectopic pregnancy surgery  ?1980's    left  . Cholecystectomy  01/28/2005  . Cataract extraction w/ intraocular lens  implant, bilateral  ~ 2000  . Ileostomy  ?  2002   Family History  Problem Relation Age of Onset  . Heart disease Mother   . Stroke Father    History  Substance Use Topics  . Smoking status: Current Every Day Smoker -- 0.12 packs/day for 18 years    Types: Cigarettes  . Smokeless tobacco: Never Used     Comment: USES E CIGARETTE  . Alcohol Use: No     Comment: 05/30/2012 "used to drink back in the day; last alcohol 23 yr ago"   OB History   Grav Para Term Preterm  Abortions TAB SAB Ect Mult Living   2 1 1  0 1 0 0 1 0 1     Review of Systems  Constitutional: Positive for fever.  HENT: Positive for facial swelling.   Cardiovascular: Negative for chest pain.  Musculoskeletal: Negative for back pain.  All other systems reviewed and are negative.     Allergies  Amoxicillin; Aspirin; Morphine and related; Penicillins; Ace inhibitors; and Gabapentin  Home Medications   Prior to Admission medications   Medication Sig Start Date End Date Taking? Authorizing Provider  acetaminophen (TYLENOL) 500 MG tablet Take 1,000 mg by mouth every 6 (six) hours as needed for pain.    Historical Provider, MD  ASACOL HD 800 MG TBEC take 1 tablet by mouth three times a day 01/18/14   Zigmund Gottron, MD  calcium citrate (CALCITRATE - DOSED IN MG ELEMENTAL CALCIUM) 950 MG tablet Take 1 tablet (200 mg of elemental calcium total) by mouth 2 (two) times daily. 05/20/13   Gaffney, MD  citalopram (CELEXA) 20 MG tablet take 1 tablet by mouth once daily    Zigmund Gottron, MD  clopidogrel (PLAVIX) 75 MG tablet Take 1 tablet (75 mg total) by mouth daily with breakfast. 01/19/14  Zigmund Gottron, MD  LORazepam (ATIVAN) 0.5 MG tablet take 1 tablet by mouth twice a day for anxiety 11/02/13   Zigmund Gottron, MD  magnesium oxide (MAG-OX) 400 (241.3 MG) MG tablet Take 1 tablet (400 mg total) by mouth daily. 08/07/13   Zigmund Gottron, MD  metoprolol succinate (TOPROL-XL) 25 MG 24 hr tablet Take 1 tablet (25 mg total) by mouth daily. 01/19/14   Zigmund Gottron, MD  simvastatin (ZOCOR) 20 MG tablet take 1 tablet by mouth at bedtime 08/17/13   Zigmund Gottron, MD  zoster vaccine live, PF, (ZOSTAVAX) 28413 UNT/0.65ML injection Inject 19,400 Units into the skin once. 01/19/14   Zigmund Gottron, MD   BP 105/64  Pulse 110  Temp(Src) 100.6 F (38.1 C) (Oral)  Resp 23  Ht 5' 6"  (1.676 m)  SpO2 93% Physical Exam CONSTITUTIONAL:  uncomfortable appearing HEAD: Normocephalic/atraumatic EYES: EOMI/PERRL ENMT: poor dentition.  Tenderness to left lower molar with gingival tenderness/edema.  No trismus.  No stridor NECK: supple no meningeal signs, tenderness/edema to left lateral neck just below mandible SPINE:entire spine nontender CV:no murmurs/rubs/gallops noted, tachycardic LUNGS: Lungs are clear to auscultation bilaterally, no apparent distress ABDOMEN: soft, nontender, no rebound or guarding NEURO: Pt is awake/alert, moves all extremitiesx4 EXTREMITIES: pulses normal, full ROM SKIN: warm, color normal PSYCH: anxious  ED Course  Procedures   6:01 PM Suspect dental abcsess with extension into soft tissue Will start with IV clindamycin  Pt has multiple drug allergies which limits use of pain meds Will need CT imaging She is currently protecting airway 6:34 PM Due to renal failure, will obtain CT maxillofacial without IV contrast 8:17 PM CT shows sialoadenitis, no abscess D/w family medicine will admit Pt currently stable/protecting her airway at this time   Labs Review Labs Reviewed  BASIC METABOLIC PANEL - Abnormal; Notable for the following:    Sodium 133 (*)    CO2 12 (*)    Glucose, Bld 110 (*)    BUN 26 (*)    Creatinine, Ser 2.67 (*)    GFR calc non Af Amer 18 (*)    GFR calc Af Amer 21 (*)    Anion gap 18 (*)    All other components within normal limits  CBC WITH DIFFERENTIAL - Abnormal; Notable for the following:    WBC 20.2 (*)    Neutrophils Relative % 81 (*)    Neutro Abs 16.3 (*)    Lymphocytes Relative 10 (*)    Monocytes Absolute 1.9 (*)    All other components within normal limits    Imaging Review Ct Maxillofacial Wo Cm  04/19/2014   CLINICAL DATA:  63 year old female with left neck and jaw swelling. Concern for abscess tooth.  EXAM: CT MAXILLOFACIAL WITHOUT CONTRAST  TECHNIQUE: Multidetector CT imaging of the maxillofacial structures was performed. Multiplanar CT image  reconstructions were also generated.  COMPARISON:  Head CT 02/20/2012, 09/24/2011  FINDINGS: Visualized skull base unremarkable.  Mastoid air cells clear. No fracture of the visualized calvarium and skull base.  Unremarkable appearance of the visualized intracranial structures. Intracranial atherosclerosis.  Unremarkable appearance the bilateral orbits.  Asymmetric appearance of the bilateral parotid glands, with the left larger than the right. The fat planes surrounding the left parotid tail are maintained, although there are multiple left-sided lymph nodes throughout the cervical/jugular lymph node chain. Largest lymph node on the left measures 9 mm subjacent to the left sternocleidomastoid.  Inflammatory changes extend from the inferior aspect the left  parotid within the parotid space, extending towards the left submandibular gland in the left submandibular space.  Asymmetric enlargement of the left submandibular gland. There is ill-defined stranding within the surrounding fat extending into the platysma on the left and through the superficial fat of the left face.  Inflammatory changes of the left platysma and superficial tissues contribute to asymmetry of the facial contour.  The patient is edentulous of the maxillary bone. There is a single left mandibular molar. There is evidence of prior root canal of the left and right maxillary first premolar as well as a single left molar. No periapical lucency to suggest a abscess.  Slight asymmetry of the left pharyngeal contour, felt to be related to the medial eyes course of the carotid artery. The pharynx is not well evaluated with the absence of IV contrast.  IMPRESSION: Left-sided sialoadenitis, involving the left submandibular gland and inferior provided gland. Inflammatory changes result in thickening of the left platysma in the overlying soft tissues, with asymmetry of the facial contour. No sialolith identified. No focal fluid collection to suggest abscess.   The patient has endodontal disease, though there are no periapical lucencies to suggest abscess tooth.  Signed,  Dulcy Fanny. Earleen Newport, DO  [facility]  [facility]   Electronically Signed   By: Corrie Mckusick D.O.   On: 04/19/2014 20:03      MDM   Final diagnoses:  Acute sialoadenitis  Chronic renal failure, unspecified stage  Dehydration    Nursing notes including past medical history and social history reviewed and considered in documentation Labs/vital reviewed and considered Previous records reviewed and considered     Sharyon Cable, MD 04/19/14 2018

## 2014-04-19 NOTE — ED Notes (Signed)
Patient states she has a broken wisdom tooth that has abscessed.   Neck and L jaw extremely swollen.   Denies SOB or swallowing.

## 2014-04-19 NOTE — H&P (Signed)
[facility] Admission History and Physical Service Pager: [phone]  Patient name: [name] [name] Medical record number: [id number] Date of birth: 1950/07/23 Age: 63 y.o. Gender: female  Primary Care Provider: Zigmund Gottron, MD Consultants: ENT Code Status: Full code  Chief Complaint: L sided facial swelling and pain  Assessment and Plan: [name] [name] is a 63 y.o. female presenting with sepsis 2/2 L sided sialoadenitis. PMH is significant for Crohns disease s/p colostomy in 1997, CKD stage III (baseline CR 1.7-2.1), HTN, HLD, CAD, h/o viral myocarditis, anxiety.  #Sepsis 2/2 L sided Sialoadenitis: CT with Left-sided sialoadenitis, involving L submandibular & inferior parotid glands, no evidence of abscess.  Febrile to 102.4, WBC 20.2 on admission. No respiratory compromise. - Admit to floor under FPTS, attending [name] [name] - Continue IV Clindamycin 635m q6h - ENT c/s in AM, eval for potential surgical intervention, however significant abscess identified on CT - IV Fentanyl 542mq2h prn for pain control (noted severe allergy with anaphylaxis to Morphine and related since 2013, discussed with Pharmacy, med review showed she has received Fentanyl and Dilaudid within past 1 year without reaction, discussed with patient, nurses to monitor closely) - Tylenol PO or PR prn fever - Zofran PO or IV prn nausea - f/u repeat AM CBC with diff - RN bedside swallow eval, start clear liquid diet - vitals per unit protocol, pulse ox with vitals - Closely monitor resp status given neck swelling with acute onset and worsening x 3 days  #Crohns disease: Patient reports increased liquid ostomy output x1 day.  No abdominal pain or blood in ostomy output. - Continue home Mesalamine - Will hold home Magnesium, unclear why patient is taking this. Avoid laxative effect currently - Consult to wound/ostomy for colostomy care  #AKI on CKD Stage III - Cr 2.67 on admission (baseline  1.7-2.1). Likely prerenal given decreased PO intake and increased ostomy output recently. - Avoid all nephrotoxic agents - f/u repeat AM BMET - IVF: NS @ 1253mr  #Anion gap metabolic acidosis - AG 18, bicarb 12 on admission.  Likely related to chronic renal disease.  Bicarb has been low in the past as well. - f/u repeat AM BMET - f/u Lactic acid level  #Mild hypotension with h/o HTN - BP 107/73 on admission - Hold home Metoprolol for hypotension - Continue to monitor BP  # Hyponatremia - Na 133 on admission.  Likely hypovolemic hyponatremia related to decreased PO intake and increased ostomy output. - NS @ 125m38m - f/u repeat AM BMET  #CAD, HLD, h/o viral myocarditis: No CP or SOB. - Continue home plavix and Zocor  #Anxiety: Mood stable. - Continue home celexa and ativan 0.5mg 57mBID prn  FEN/GI: clear liquids, NPO (sips and chips) after 8am on 10/16 pending ENT c/s, NS @ 125mL/71m24h Prophylaxis: subq heparin  Disposition: Admit to floor under FPTS, attending [name] [name]  History of Present Illness: [name] Katona62 y.o77female presenting with L facial swelling x4 days.  PMH is significant for Crohns disease s/p colostomy in 1997, CKD stage III (baseline CR 1.7-2.1), HTN, HLD, CAD, h/o viral myocarditis, anxiety.  Patient reports that she noticed a small bump behind her L ear 3 days ago.  By the next morning, the mass had expanded down the L side of her face.  It has been progressively expanding down L face and neck.  It is painful, and the pain is worsening as the mass enlarges.  She is also having ear pain on  the L side.  +Fever and chills at home x3 days.  She vomited once on day 2 of illness.  She reports that it became painful to swallow today.  She denies any difficulty breathing, but has noticed that her voice is hoarse.  She has been taking tylenol at home, which has helped her fevers, but not her pain.  She also reports increased liquid ostomy output today and has had  decreased PO intake.  Review Of Systems: Per HPI with the following additions: +mild lower abdominal pain, +HA, +lightheadedness Otherwise 12 point review of systems was performed and was unremarkable.  Patient Active Problem List   Diagnosis Date Noted  . Acute sialoadenitis 04/19/2014  . Sialadenitis 04/19/2014  . Hypomagnesemia 07/13/2013  . Unspecified vitamin D deficiency 05/20/2013  . Eczema 05/03/2013  . CAD (coronary artery disease) 05/30/2012  . Tobacco abuse 05/30/2012  . COPD (chronic obstructive pulmonary disease) 05/30/2012  . Anemia in chronic kidney disease 05/30/2012  . Osteopenia 10/05/2011  . Abnormal Pap smear 09/21/2011  . Left renal artery stenosis 09/04/2011  . Chronic kidney disease (CKD), stage III (moderate) 07/30/2011  . Seizure 03/13/2011  . Hypertension, benign 09/25/2010  . HYPERCHOLESTEROLEMIA 01/10/2007  . Anxiety 09/02/2006  . NEUROPATHY, PERIPHERAL 09/02/2006  . Reflux esophagitis 09/02/2006  . CROHN'S DISEASE 09/02/2006   Past Medical History: Past Medical History  Diagnosis Date  . Crohn's disease   . Hypertension   . Hyperlipidemia   . CHF (congestive heart failure)     EF 30-35% 2012->EF 60-65% 2013  . GERD (gastroesophageal reflux disease)   . 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 Anxiety   . Panic attacks   . CKD (chronic kidney disease), stage III   . Anemia, chronic renal failure   . Asthma 05/2011  . Headache(784.0)     "related to high BP" (05/30/2012)  . Tobacco abuse   . Abnormal chest CT     Coronary atherosclerosis on chest CT 2012   Past Surgical History: Past Surgical History  Procedure Laterality Date  . 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 Ectopic pregnancy surgery  ?1980's    left  . Cholecystectomy  01/28/2005  . Cataract extraction w/ intraocular lens  implant, bilateral  ~ 2000  . Ileostomy  ?  2002   Social History: History  Substance Use Topics  . Smoking status: Current Every Day Smoker -- 0.12  packs/day for 18 years    Types: Cigarettes  . Smokeless tobacco: Never Used     Comment: USES E CIGARETTE  . Alcohol Use: No     Comment: 05/30/2012 "used to drink back in the day; last alcohol 23 yr ago"   Additional social history: 1 pack/2.5 weeks, no EtOH, no illicit drugs Please also refer to relevant sections of EMR.  Family History: Family History  Problem Relation Age of Onset  . Heart disease Mother   . Stroke Father    Allergies and Medications: Allergies  Allergen Reactions  . Amoxicillin Anaphylaxis, Rash and Other (See Comments)    REACTION:Throat Swelling  . Aspirin Anaphylaxis, Itching and Rash  . Morphine And Related Anaphylaxis  . Penicillins Anaphylaxis and Rash  . Gabapentin Other (See Comments)    Patient had one time seizure shortly after stopping gabapentin  . Ace Inhibitors Other (See Comments) and Cough    ACE possibly associated with cough and switched to ARB.  Would be OK to re-challenge if needed.   No current facility-administered medications  on file prior to encounter.   Current Outpatient Prescriptions on File Prior to Encounter  Medication Sig Dispense Refill  . acetaminophen (TYLENOL) 500 MG tablet Take 1,000 mg by mouth every 6 (six) hours as needed for pain.      . calcium citrate (CALCITRATE - DOSED IN MG ELEMENTAL CALCIUM) 950 MG tablet Take 1 tablet (200 mg of elemental calcium total) by mouth 2 (two) times daily.  60 tablet  1  . clopidogrel (PLAVIX) 75 MG tablet Take 1 tablet (75 mg total) by mouth daily with breakfast.  90 tablet  3  . magnesium oxide (MAG-OX) 400 (241.3 MG) MG tablet Take 1 tablet (400 mg total) by mouth daily.  30 tablet  6  . metoprolol succinate (TOPROL-XL) 25 MG 24 hr tablet Take 1 tablet (25 mg total) by mouth daily.  90 tablet  3    Objective: BP 107/73  Pulse 122  Temp(Src) 102.3 F (39.1 C) (Oral)  Resp 20  Ht 5' 6"  (1.676 m)  Wt 134 lb 4.2 oz (60.9 kg)  BMI 21.68 kg/m2  SpO2 100% Exam: General:  Pleasant AAF laying in hospital bed in NAD. Hoarse voice. HEENT: PERRL, EOMI, L side of face notable larger than R, Firm raised area on L cheek, no overlying erythema, MMM, Edema of L buccal surface, OP clear. Cannot fully open mouth 2/2 pain. Cardiovascular: RRR, no m/r/g. Respiratory: CTAB, no w/r/c. Good air movement. Normal WOB, though patient does occasionally gasp for air while talking Abdomen: Soft, NDNT, ostomy with liquid output and air filled bag.  No erythema around ostomy. Extremities: WWP, no edema/cyanosis Skin: No rashes noted Neuro: CN 2-12 grossly intact. No focal deficits. Moves all extremities.  Labs and Imaging: CBC BMET   Recent Labs Lab 04/19/14 1754  WBC 20.2*  HGB 12.5  HCT 37.7  PLT 191    Recent Labs Lab 04/19/14 1754  NA 133*  K 4.7  CL 103  CO2 12*  BUN 26*  CREATININE 2.67*  GLUCOSE 110*  CALCIUM 10.1      CT Maxillofacial (10/15): Left-sided sialoadenitis, involving the left submandibular gland and inferior provided gland. Inflammatory changes result in thickening of the left platysma in the overlying soft tissues, with asymmetry  of the facial contour. No sialolith identified. No focal fluid collection to suggest abscess.  The patient has endodontal disease, though there are no periapical  lucencies to suggest abscess tooth.   Lavon Paganini, MD 04/20/2014, 1:16 AM PGY-1, [facility] Intern pager: [phone], text pages welcome  Upper Level Addendum:  I have seen and evaluated this patient along with Dr. Brita Romp and reviewed the above note, making necessary revisions in purple.

## 2014-04-19 NOTE — Progress Notes (Signed)
New Admission Note:   Arrival Method: stretcher via ED Mental Orientation: Alert and Oriented x4 Telemetry: N/A Assessment: Completed; see Doc Flowsheets  Skin: Intact, warm, and dry IV: Peripheral IV Left Forearm Saline locked Pain: c/o 6/10 to Left Facial swelling Tubes: N/A Safety Measures: Educated on fall prevention safety plan, patient acknowledged and understood. Admission: Completed [facility] Orientation: Patient has been orientated to the room, unit and staff.  Family: N/A  Orders have been reviewed and implemented. Will continue to monitor the patient. Call light has been placed within reach and bed alarm has been activated.    Dorothea Glassman, RN  Phone number: [phone]

## 2014-04-20 DIAGNOSIS — K1121 Acute sialoadenitis: Secondary | ICD-10-CM

## 2014-04-20 LAB — BASIC METABOLIC PANEL
ANION GAP: 21 — AB (ref 5–15)
ANION GAP: 22 — AB (ref 5–15)
BUN: 32 mg/dL — AB (ref 6–23)
BUN: 36 mg/dL — ABNORMAL HIGH (ref 6–23)
CHLORIDE: 105 meq/L (ref 96–112)
CO2: 9 mEq/L — CL (ref 19–32)
CO2: 10 mEq/L — CL (ref 19–32)
Calcium: 9.3 mg/dL (ref 8.4–10.5)
Calcium: 8.9 mg/dL (ref 8.4–10.5)
Chloride: 102 mEq/L (ref 96–112)
Creatinine, Ser: 2.88 mg/dL — ABNORMAL HIGH (ref 0.50–1.10)
Creatinine, Ser: 2.97 mg/dL — ABNORMAL HIGH (ref 0.50–1.10)
GFR, EST AFRICAN AMERICAN: 19 mL/min — AB (ref >90)
GFR, EST AFRICAN AMERICAN: 19 mL/min — AB (ref >90)
GFR, EST NON AFRICAN AMERICAN: 17 mL/min — AB (ref >90)
GFR, EST NON AFRICAN AMERICAN: 16 mL/min — AB (ref >90)
Glucose, Bld: 101 mg/dL — ABNORMAL HIGH (ref 70–99)
Glucose, Bld: 150 mg/dL — ABNORMAL HIGH (ref 70–99)
POTASSIUM: 4.3 meq/L (ref 3.7–5.3)
POTASSIUM: 4.4 meq/L (ref 3.7–5.3)
SODIUM: 135 meq/L — AB (ref 137–147)
Sodium: 134 mEq/L — ABNORMAL LOW (ref 137–147)

## 2014-04-20 LAB — CBC WITH DIFFERENTIAL/PLATELET
BASOS PCT: 0 % (ref 0–1)
Basophils Absolute: 0 10*3/uL (ref 0.0–0.1)
EOS ABS: 0 10*3/uL (ref 0.0–0.7)
Eosinophils Relative: 0 % (ref 0–5)
HCT: 31.7 % — ABNORMAL LOW (ref 36.0–46.0)
HEMOGLOBIN: 10.7 g/dL — AB (ref 12.0–15.0)
LYMPHS ABS: 2.5 10*3/uL (ref 0.7–4.0)
Lymphocytes Relative: 12 % (ref 12–46)
MCH: 31.2 pg (ref 26.0–34.0)
MCHC: 33.8 g/dL (ref 30.0–36.0)
MCV: 92.4 fL (ref 78.0–100.0)
Monocytes Absolute: 2.6 10*3/uL — ABNORMAL HIGH (ref 0.1–1.0)
Monocytes Relative: 12 % (ref 3–12)
NEUTROS ABS: 16 10*3/uL — AB (ref 1.7–7.7)
NEUTROS PCT: 76 % (ref 43–77)
Platelets: 181 10*3/uL (ref 150–400)
RBC: 3.43 MIL/uL — AB (ref 3.87–5.11)
RDW: 14.7 % (ref 11.5–15.5)
WBC: 21.1 10*3/uL — ABNORMAL HIGH (ref 4.0–10.5)

## 2014-04-20 LAB — BLOOD GAS, ARTERIAL
ACID-BASE DEFICIT: 6.6 mmol/L — AB (ref 0.0–2.0)
BICARBONATE: 17.1 meq/L — AB (ref 20.0–24.0)
Drawn by: 24513
O2 Saturation: 91.2 %
PH ART: 7.411 (ref 7.350–7.450)
PO2 ART: 59 mmHg — AB (ref 80.0–100.0)
Patient temperature: 98.6
TCO2: 18 mmol/L (ref 0–100)
pCO2 arterial: 27.5 mmHg — ABNORMAL LOW (ref 35.0–45.0)

## 2014-04-20 LAB — LACTIC ACID, PLASMA
Lactic Acid, Venous: 0.8 mmol/L (ref 0.5–2.2)
Lactic Acid, Venous: 1.2 mmol/L (ref 0.5–2.2)

## 2014-04-20 MED ORDER — MEROPENEM 500 MG IV SOLR
500.0000 mg | Freq: Two times a day (BID) | INTRAVENOUS | Status: AC
  Administered 2014-04-20 – 2014-04-23 (×7): 500 mg via INTRAVENOUS
  Filled 2014-04-20 (×7): qty 0.5

## 2014-04-20 MED ORDER — SODIUM CHLORIDE 0.9 % IV BOLUS (SEPSIS)
500.0000 mL | Freq: Once | INTRAVENOUS | Status: AC
  Administered 2014-04-20: 500 mL via INTRAVENOUS

## 2014-04-20 MED ORDER — VANCOMYCIN HCL IN DEXTROSE 1-5 GM/200ML-% IV SOLN
1000.0000 mg | INTRAVENOUS | Status: DC
  Administered 2014-04-22: 1000 mg via INTRAVENOUS
  Filled 2014-04-20: qty 200

## 2014-04-20 MED ORDER — VANCOMYCIN HCL 10 G IV SOLR
1250.0000 mg | Freq: Once | INTRAVENOUS | Status: AC
  Administered 2014-04-20: 1250 mg via INTRAVENOUS
  Filled 2014-04-20 (×2): qty 1250

## 2014-04-20 MED ORDER — SODIUM BICARBONATE 8.4 % IV SOLN
INTRAVENOUS | Status: DC
  Administered 2014-04-20 – 2014-04-22 (×4): via INTRAVENOUS
  Filled 2014-04-20 (×11): qty 150

## 2014-04-20 NOTE — Consult Note (Signed)
ENT CONSULT:  Reason for Consult:Left Acute Parotitis Referring Physician: FM svc  [name] [name] is an 63 y.o. female.  HPI: Patient presents with history of left facial swelling, tenderness and fever. She reports recent symptoms of upper respiratory tract infection, no prior salivary gland abnormality. She reports a three-day history of progressive swelling in the left cheek and perimandibular region, seen in the emergency department with complaints of pain, difficulty swallowing and fever. CT scan shows diffuse swelling and inflammatory changes involving the left parotid and submandibular glands, no evidence of stone, obstruction or abscess, left neck adenopathy.  Past Medical History  Diagnosis Date  . Crohn's disease   . Hypertension   . Hyperlipidemia   . CHF (congestive heart failure)     EF 30-35% 2012->EF 60-65% 2013  . GERD (gastroesophageal reflux disease)   . 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 Anxiety   . Panic attacks   . CKD (chronic kidney disease), stage III   . Anemia, chronic renal failure   . Asthma 05/2011  . Headache(784.0)     "related to high BP" (05/30/2012)  . Tobacco abuse   . Abnormal chest CT     Coronary atherosclerosis on chest CT 2012    Past Surgical History  Procedure Laterality Date  . 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 Ectopic pregnancy surgery  ?1980's    left  . Cholecystectomy  01/28/2005  . Cataract extraction w/ intraocular lens  implant, bilateral  ~ 2000  . Ileostomy  ?  2002    Family History  Problem Relation Age of Onset  . Heart disease Mother   . Stroke Father     Social History:  reports that she has been smoking Cigarettes.  She has a 2.16 pack-year smoking history. She has never used smokeless tobacco. She reports that she does not drink alcohol or use illicit drugs.  Allergies:  Allergies  Allergen Reactions  . Amoxicillin Anaphylaxis, Rash and Other (See Comments)    REACTION:Throat Swelling  . Aspirin Anaphylaxis,  Itching and Rash  . Morphine And Related Anaphylaxis  . Penicillins Anaphylaxis and Rash  . Gabapentin Other (See Comments)    Patient had one time seizure shortly after stopping gabapentin  . Ace Inhibitors Other (See Comments) and Cough    ACE possibly associated with cough and switched to ARB.  Would be OK to re-challenge if needed.    Medications: I have reviewed the patient's current medications.  Results for orders placed during the hospital encounter of 04/19/14 (from the past 48 hour(s))  BASIC METABOLIC PANEL     Status: Abnormal   Collection Time    04/19/14  5:54 PM      Result Value Ref Range   Sodium 133 (*) 137 - 147 mEq/L   Potassium 4.7  3.7 - 5.3 mEq/L   Chloride 103  96 - 112 mEq/L   CO2 12 (*) 19 - 32 mEq/L   Glucose, Bld 110 (*) 70 - 99 mg/dL   BUN 26 (*) 6 - 23 mg/dL   Creatinine, Ser 2.67 (*) 0.50 - 1.10 mg/dL   Calcium 10.1  8.4 - 10.5 mg/dL   GFR calc non Af Amer 18 (*) >90 mL/min   GFR calc Af Amer 21 (*) >90 mL/min   Comment: (NOTE)     The eGFR has been calculated using the CKD EPI equation.     This calculation has not been validated in all clinical situations.  eGFR's persistently <90 mL/min signify possible Chronic Kidney     Disease.   Anion gap 18 (*) 5 - 15  CBC WITH DIFFERENTIAL     Status: Abnormal   Collection Time    04/19/14  5:54 PM      Result Value Ref Range   WBC 20.2 (*) 4.0 - 10.5 K/uL   RBC 4.03  3.87 - 5.11 MIL/uL   Hemoglobin 12.5  12.0 - 15.0 g/dL   HCT 37.7  36.0 - 46.0 %   MCV 93.5  78.0 - 100.0 fL   MCH 31.0  26.0 - 34.0 pg   MCHC 33.2  30.0 - 36.0 g/dL   RDW 14.7  11.5 - 15.5 %   Platelets 191  150 - 400 K/uL   Neutrophils Relative % 81 (*) 43 - 77 %   Neutro Abs 16.3 (*) 1.7 - 7.7 K/uL   Lymphocytes Relative 10 (*) 12 - 46 %   Lymphs Abs 2.0  0.7 - 4.0 K/uL   Monocytes Relative 9  3 - 12 %   Monocytes Absolute 1.9 (*) 0.1 - 1.0 K/uL   Eosinophils Relative 0  0 - 5 %   Eosinophils Absolute 0.0  0.0 - 0.7 K/uL    Basophils Relative 0  0 - 1 %   Basophils Absolute 0.0  0.0 - 0.1 K/uL  BASIC METABOLIC PANEL     Status: Abnormal   Collection Time    04/20/14  4:56 AM      Result Value Ref Range   Sodium 135 (*) 137 - 147 mEq/L   Potassium 4.3  3.7 - 5.3 mEq/L   Chloride 105  96 - 112 mEq/L   CO2 9 (*) 19 - 32 mEq/L   Comment: CRITICAL RESULT CALLED TO, READ BACK BY AND VERIFIED WITH:     DANG L,RN 04/20/14 0602 WAYK   Glucose, Bld 101 (*) 70 - 99 mg/dL   BUN 32 (*) 6 - 23 mg/dL   Creatinine, Ser 2.88 (*) 0.50 - 1.10 mg/dL   Calcium 9.3  8.4 - 10.5 mg/dL   GFR calc non Af Amer 17 (*) >90 mL/min   GFR calc Af Amer 19 (*) >90 mL/min   Comment: (NOTE)     The eGFR has been calculated using the CKD EPI equation.     This calculation has not been validated in all clinical situations.     eGFR's persistently <90 mL/min signify possible Chronic Kidney     Disease.   Anion gap 21 (*) 5 - 15  CBC WITH DIFFERENTIAL     Status: Abnormal   Collection Time    04/20/14  4:56 AM      Result Value Ref Range   WBC 21.1 (*) 4.0 - 10.5 K/uL   RBC 3.43 (*) 3.87 - 5.11 MIL/uL   Hemoglobin 10.7 (*) 12.0 - 15.0 g/dL   HCT 31.7 (*) 36.0 - 46.0 %   MCV 92.4  78.0 - 100.0 fL   MCH 31.2  26.0 - 34.0 pg   MCHC 33.8  30.0 - 36.0 g/dL   RDW 14.7  11.5 - 15.5 %   Platelets 181  150 - 400 K/uL   Neutrophils Relative % 76  43 - 77 %   Neutro Abs 16.0 (*) 1.7 - 7.7 K/uL   Lymphocytes Relative 12  12 - 46 %   Lymphs Abs 2.5  0.7 - 4.0 K/uL   Monocytes Relative 12  3 -  12 %   Monocytes Absolute 2.6 (*) 0.1 - 1.0 K/uL   Eosinophils Relative 0  0 - 5 %   Eosinophils Absolute 0.0  0.0 - 0.7 K/uL   Basophils Relative 0  0 - 1 %   Basophils Absolute 0.0  0.0 - 0.1 K/uL  LACTIC ACID, PLASMA     Status: None   Collection Time    04/20/14  4:56 AM      Result Value Ref Range   Lactic Acid, Venous 0.8  0.5 - 2.2 mmol/L    Ct Maxillofacial Wo Cm  04/19/2014   CLINICAL DATA:  64 year old female with left neck and jaw  swelling. Concern for abscess tooth.  EXAM: CT MAXILLOFACIAL WITHOUT CONTRAST  TECHNIQUE: Multidetector CT imaging of the maxillofacial structures was performed. Multiplanar CT image reconstructions were also generated.  COMPARISON:  Head CT 02/20/2012, 09/24/2011  FINDINGS: Visualized skull base unremarkable.  Mastoid air cells clear. No fracture of the visualized calvarium and skull base.  Unremarkable appearance of the visualized intracranial structures. Intracranial atherosclerosis.  Unremarkable appearance the bilateral orbits.  Asymmetric appearance of the bilateral parotid glands, with the left larger than the right. The fat planes surrounding the left parotid tail are maintained, although there are multiple left-sided lymph nodes throughout the cervical/jugular lymph node chain. Largest lymph node on the left measures 9 mm subjacent to the left sternocleidomastoid.  Inflammatory changes extend from the inferior aspect the left parotid within the parotid space, extending towards the left submandibular gland in the left submandibular space.  Asymmetric enlargement of the left submandibular gland. There is ill-defined stranding within the surrounding fat extending into the platysma on the left and through the superficial fat of the left face.  Inflammatory changes of the left platysma and superficial tissues contribute to asymmetry of the facial contour.  The patient is edentulous of the maxillary bone. There is a single left mandibular molar. There is evidence of prior root canal of the left and right maxillary first premolar as well as a single left molar. No periapical lucency to suggest a abscess.  Slight asymmetry of the left pharyngeal contour, felt to be related to the medial eyes course of the carotid artery. The pharynx is not well evaluated with the absence of IV contrast.  IMPRESSION: Left-sided sialoadenitis, involving the left submandibular gland and inferior provided gland. Inflammatory changes  result in thickening of the left platysma in the overlying soft tissues, with asymmetry of the facial contour. No sialolith identified. No focal fluid collection to suggest abscess.  The patient has endodontal disease, though there are no periapical lucencies to suggest abscess tooth.  Signed,  Dulcy Fanny. Earleen Newport, DO  [facility]  [facility]   Electronically Signed   By: Corrie Mckusick D.O.   On: 04/19/2014 20:03    ROS: Per admission paperwork, no evidence of dysphagia, diet aphasia, oral mucosal ulcer, mass or lesion.  Blood pressure 85/54, pulse 115, temperature 99.8 F (37.7 C), temperature source Oral, resp. rate 20, height 5' 6"  (1.676 m), weight 60.9 kg (134 lb 4.2 oz), SpO2 100.00%.  PHYSICAL EXAM: General appearance - alert, well appearing, and in no distress Nose - normal and patent, no erythema, discharge or polyps Mouth - mucous membranes moist, pharynx normal without lesions and Partially edentulous, wearing full upper denture, partial lower. normal salivary flow through the left parotid duct, no evidence of obstruction, stone or purulent discharge. Patient tender to bimanual palpation. Neck - significant left  parotid swelling, tender to palpation, no erythema. Normal facial function, no palpable adenopathy.  Studies Reviewed: Head and neck CT scan as above  Assessment/Plan: The patient presents with findings of acute left parotitis which may be related to recent upper respiratory tract infection and dehydration. No evidence of purulent discharge from the salivary duct or obstructive stone. Recommend continuing current medical therapy including aggressive intravenous hydration as tolerated, pain management and antibiotic therapy, patient switched from clindamycin to meropenem. No abscess on CT scan, no surgical intervention. Salivary precautions including gentle oral mouth rinse, oral fluids and soft diet as tolerated and sialagogues.  Recommend continued medical management, would expect improvement over the next 3-5 days. Discharged on oral antibiotics and hydration when stable. Reconsult ENT service as needed if progression in patient's symptoms.  [place], Tayvion Lauder 04/20/2014, 12:29 PM

## 2014-04-20 NOTE — Progress Notes (Signed)
Paged resident to inform them of elevated temp at 101.6 and BP of 90/58 with heart at 120. Awaiting return page.

## 2014-04-20 NOTE — Progress Notes (Signed)
Pt's vital signs rechecked after receiving 589m NS bolus and PRN Tylenol PO and Fentanyl 580m IV. T-101.6, P-117, BP-95/47, O2 sat-98% at room air. [facility] NeLonny Prudeordered to start pt on Vancomycin [id number] pharmacy review. Will carry out above order and continue to monitor pt closely.

## 2014-04-20 NOTE — Progress Notes (Signed)
Received a telephone order from Dr. Lonny Prude of Family Medicine to have another set of vital signs checked at this time; T-102.8, P-120, BP-88/52, O2 sat-100% at room air. Above vital signs relayed to above-mentioned MD. Ordered to give 568m NS bolus STAT and PRN Fentanyl 518m IV. Orders carried out, PRN dose of Tylenol 65081mlso given PO for fever. Will continue to monitor.

## 2014-04-20 NOTE — Progress Notes (Signed)
ANTIBIOTIC CONSULT NOTE - INITIAL  Pharmacy Consult for Vancomycin Indication: Sialoadenitis/Parotitis   Allergies  Allergen Reactions  . Amoxicillin Anaphylaxis, Rash and Other (See Comments)    REACTION:Throat Swelling  . Aspirin Anaphylaxis, Itching and Rash  . Morphine And Related Anaphylaxis  . Penicillins Anaphylaxis and Rash  . Gabapentin Other (See Comments)    Patient had one time seizure shortly after stopping gabapentin  . Ace Inhibitors Other (See Comments) and Cough    ACE possibly associated with cough and switched to ARB.  Would be OK to re-challenge if needed.    Patient Measurements: Height: 5' 6"  (167.6 cm) Weight: 134 lb 4.2 oz (60.9 kg) IBW/kg (Calculated) : 59.3 Adjusted Body Weight:   Vital Signs: Temp: 102.8 F (39.3 C) (10/16 2026) Temp Source: Oral (10/16 2026) BP: 90/58 mmHg (10/16 1837) Pulse Rate: 120 (10/16 2026) Intake/Output from previous day:   Intake/Output from this shift:    Labs:  Recent Labs  04/19/14 1754 04/20/14 0456 04/20/14 1855  WBC 20.2* 21.1*  --   HGB 12.5 10.7*  --   PLT 191 181  --   CREATININE 2.67* 2.88* 2.97*   Estimated Creatinine Clearance: 18.9 ml/min (by C-G formula based on Cr of 2.97). No results found for this basename: VANCOTROUGH, VANCOPEAK, VANCORANDOM, GENTTROUGH, GENTPEAK, GENTRANDOM, TOBRATROUGH, TOBRAPEAK, TOBRARND, AMIKACINPEAK, AMIKACINTROU, AMIKACIN,  in the last 72 hours   Microbiology: No results found for this or any previous visit (from the past 720 hour(s)).  Medical History: Past Medical History  Diagnosis Date  . Crohn's disease   . Hypertension   . Hyperlipidemia   . CHF (congestive heart failure)     EF 30-35% 2012->EF 60-65% 2013  . GERD (gastroesophageal reflux disease)   . 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 Anxiety   . Panic attacks   . CKD (chronic kidney disease), stage III   . Anemia, chronic renal failure   . Asthma 05/2011  . Headache(784.0)     "related to  high BP" (05/30/2012)  . Tobacco abuse   . Abnormal chest CT     Coronary atherosclerosis on chest CT 2012   Assessment: 62yof on Meropenem for sialoadenitis/parotitis now to have antibiotics broadened with Vancomycin for recurrent fever (Tmax 102.8). WBC remain elevated ~21, blood cultures pending.  - Wt 61kg - CrCl 19 (SCr trending up)  Goal of Therapy:  Vancomycin trough level 15-20 mcg/ml  Plan:  1. Vancomycin 1.25g IV x 1, then 1g IV q48h 2. Continue Meropenem 553m IV q12h 3. Monitor renal function, cultures, clinical course and [id number]/16/2015,9:38 PM

## 2014-04-20 NOTE — Consult Note (Addendum)
[employer] ostomy consult note  Stoma type/location: Pt familiar to [facility] team from previous admission, refer to 05/16/13 note.  She had surgery to repair a prolapsed stoma since the last visit. Pt had original colostomy surgery in 1997 and has been independent with pouching activities since that time. Denies any peristomal skin problems.  Stomal assessment/size: Stoma 13/4 inches, flush with skin level. She uses a one piece Kayara with clamp and belt and is not interested in changing to a high output pouch or velcro opening. Current pouch seal is leaking.  Peristomal assessment: Intact skin surrounding.  Output 100cc liquid brown stool Ostomy pouching: 1pc. Kayara and belt applied Education provided: She denies questions regarding pouching activities, ordering supplies, or application. Supplies ordered to bedside for pt use.  Please re-consult if further assistance is needed. Thank-you,  Julien Girt MSN, [place], [place], [place], [place]

## 2014-04-20 NOTE — Progress Notes (Signed)
Primary MD.  Seen and examined.  I am impressed by her hot, swollen parotid and upper neck on Left.  Agree with antibiotics.  Appreciate great care by team and help of ENT consult.  I bumped her fluids a little.  She has high insensible losses due to the output of her ileostomy.

## 2014-04-20 NOTE — Progress Notes (Signed)
Family Medicine Teaching Service Daily Progress Note Intern Pager: [phone]  Patient name: [name] [name] Medical record number: [id number] Date of birth: 1950/11/24 Age: 63 y.o. Gender: female  Primary Care Provider: Zigmund Gottron, MD Consultants: ENT Code Status: Full code  Pt Overview and Major Events to Date:  10/15 - admit to [facility] for L neck/facial swelling  Assessment and Plan: [name] [name] is a 63 y.o. female presenting with sepsis 2/2 L sided sialoadenitis. PMH is significant for Crohns disease s/p colostomy in 1997, CKD stage III (baseline CR 1.7-2.1), HTN, HLD, CAD, h/o viral myocarditis, anxiety.   #Sepsis 2/2 L sided Sialoadenitis: CT with Left-sided sialoadenitis, involving L submandibular & inferior parotid glands, no evidence of abscess. Febrile Tmax 102.5, WBC 20.2 > 21.1. No respiratory compromise.  - Continue IV Clindamycin 611m q6h  - ENT to see patient today for potential surgical intervention, appreciate recs  - IV Fentanyl 572mq2h prn for pain control  - Tylenol PO or PR prn fever  - Zofran PO or IV prn nausea   - RN bedside swallow eval, start clear liquid diet  - Closely monitor resp status given neck swelling with acute onset and worsening x 3 days  - f/u BCx (drawn after initiation of abx)  #Crohns disease: Reported increased liquid ostomy output x1 day on admission. No abdominal pain or blood in ostomy output.  - Continue home Mesalamine  - Will hold home Magnesium, unclear why patient is taking this. Avoid laxative effect currently  - Consult to wound/ostomy for colostomy care   #AKI on CKD Stage III - Cr 2.67 > 2.88 (baseline 1.7-2.1). Likely prerenal given decreased PO intake and increased ostomy output recently.  - Avoid all nephrotoxic agents  - Continue to monitor  - Continue IVF (see below)  #Anion gap metabolic acidosis - AG 1802>72bicarb 12>9 Likely related to AKI on CKD. Bicarb has been low in the past as well.  - Lactic acid wnl  (0.8) - Continue D5 with 15083mNa bicarb @125mL /hr  #Mild hypotension with h/o HTN - BP 92-120/64-80 O/N.  - Holding home Metoprolol for hypotension  - Continue to monitor BP - Will bolus with 500m58m this AM   # Hyponatremia - Improving. Na 133>135. Likely hypovolemic hyponatremia related to decreased PO intake and increased ostomy output.  - Continue IVF as above  - Continue to monitor  # Normocytic Anemia: Hgb 12.5>10.7.  Seems to be around baseline (10-11).  Likely hemo-concentrated on admission 2/2 decr PO intake. - Continue to monitor  #CAD, HLD, h/o viral myocarditis: No CP or SOB.  - Continue home plavix and Zocor   #Anxiety: Mood stable.  - Continue home celexa and ativan 0.5mg 24mBID prn   FEN/GI: currently NPO (sips and chips) pending ENT c/s, D5 with 150mEq83mbicarb @125mL /hr  Prophylaxis: subq heparin   Disposition: Pending ENT c/s  Subjective:  Patient reports that pain is better controlled with Fentanyl.  She would like to be able to drink liquids again.  She also needs a need ostomy bag.  Objective: Temp:  [99.4 F (37.4 C)-102.5 F (39.2 C)] 99.8 F (37.7 C) (10/16 0841) Pulse Rate:  [110-123] 115 (10/16 0841) Resp:  [16-23] 20 (10/16 0841) BP: (85-120)/(54-80) 85/54 mmHg (10/16 0841) SpO2:  [93 %-100 %] 100 % (10/16 0841) Weight:  [134 lb 4.2 oz (60.9 kg)] 134 lb 4.2 oz (60.9 kg) (10/15 2119) Physical Exam: General: Pleasant AAF laying in hospital bed in NAD. Hoarse voice.  HEENT: PERRL, EOMI, L side of face notable larger than R, Firm raised area on L cheek, no overlying erythema, MMM, Edema of L buccal surface, OP clear. Cannot fully open mouth 2/2 pain. Unchanged from yesterday.  Cardiovascular: RRR, no m/r/g.  Respiratory: CTAB, no w/r/c. Good air movement. Normal WOB Abdomen: Soft, NDNT, ostomy with liquid output and air filled bag. No erythema around ostomy.  Extremities: WWP, no edema/cyanosis  Neuro: CN 2-12 grossly intact. No focal  deficits. Moves all extremities.   Laboratory:  Recent Labs Lab 04/19/14 1754 04/20/14 0456  WBC 20.2* 21.1*  HGB 12.5 10.7*  HCT 37.7 31.7*  PLT 191 181    Recent Labs Lab 04/19/14 1754 04/20/14 0456  NA 133* 135*  K 4.7 4.3  CL 103 105  CO2 12* 9*  BUN 26* 32*  CREATININE 2.67* 2.88*  CALCIUM 10.1 9.3  GLUCOSE 110* 101*    Lactic acid 0.8  Imaging/Diagnostic Tests: CT Maxillofacial (10/15): Left-sided sialoadenitis, involving the left submandibular gland and inferior provided gland. Inflammatory changes result in thickening of the left platysma in the overlying soft tissues, with asymmetry  of the facial contour. No sialolith identified. No focal fluid collection to suggest abscess.  The patient has endodontal disease, though there are no periapical  lucencies to suggest abscess tooth.   Lavon Paganini, MD 04/20/2014, 9:53 AM PGY-1, [facility] Intern pager: [phone], text pages welcome

## 2014-04-20 NOTE — Progress Notes (Signed)
UR completed 

## 2014-04-20 NOTE — Progress Notes (Addendum)
ANTIBIOTIC CONSULT NOTE - INITIAL  Pharmacy Consult for meropenem Indication: sepsis d/t sialoadenitis  Allergies  Allergen Reactions  . Amoxicillin Anaphylaxis, Rash and Other (See Comments)    REACTION:Throat Swelling  . Aspirin Anaphylaxis, Itching and Rash  . Morphine And Related Anaphylaxis  . Penicillins Anaphylaxis and Rash  . Gabapentin Other (See Comments)    Patient had one time seizure shortly after stopping gabapentin  . Ace Inhibitors Other (See Comments) and Cough    ACE possibly associated with cough and switched to ARB.  Would be OK to re-challenge if needed.    Patient Measurements: Height: 5' 6"  (167.6 cm) Weight: 134 lb 4.2 oz (60.9 kg) IBW/kg (Calculated) : 59.3   Vital Signs: Temp: 99.8 F (37.7 C) (10/16 0841) Temp Source: Oral (10/16 0841) BP: 85/54 mmHg (10/16 0841) Pulse Rate: 115 (10/16 0841) Intake/Output from previous day:   Intake/Output from this shift: Total I/O In: -  Out: 30 [Stool:30]  Labs:  Recent Labs  04/19/14 1754 04/20/14 0456  WBC 20.2* 21.1*  HGB 12.5 10.7*  PLT 191 181  CREATININE 2.67* 2.88*   Estimated Creatinine Clearance: 19.4 ml/min (by C-G formula based on Cr of 2.88). No results found for this basename: VANCOTROUGH, VANCOPEAK, VANCORANDOM, GENTTROUGH, GENTPEAK, GENTRANDOM, TOBRATROUGH, TOBRAPEAK, TOBRARND, AMIKACINPEAK, AMIKACINTROU, AMIKACIN,  in the last 72 hours   Microbiology: No results found for this or any previous visit (from the past 720 hour(s)).  Assessment: 81 YOF presenting with L-sided facial swelling and pain. CT revealed sialoadenitis involved L submandibular and inferior parotid glands with no evidence of abscess. Already on clindamycin 653m IV q8h- team wants to broaden therapy, discussed with them and ID pharmacist and decision reached to switch to meropenem monotherapy for now with the intention to discharge on PO clindamycin. WBC 21.1, Tmax/24h 102.5. SCr 2.88 with est CrCl  ~280mmin. Blood cultures have been sent this morning- noted these were drawn after antibiotic initiation.  Goal of Therapy:  eradication of infection  Plan:  1. D/c clindamycin 2. Meropenem 50033mV q12h 3. Follow c/s, clinical progression, renal function and discharge planning  Lakisa Lotz D. Talynn Lebon, PharmD, BCPS Clinical Pharmacist Pager: [phone]/16/2015 11:07 AM

## 2014-04-20 NOTE — Progress Notes (Signed)
CRITICAL VALUE ALERT  Critical value received:  CO2 - 10  Date of notification: 04/20/14  Time of notification:  1946  Critical value read back:Yes.    Nurse who received alert: Georgeanna Harrison, RN  MD notified (1st page):  Dr. Lonny Prude  Time of first page: 1950  MD notified (2nd page):  Time of second page:  Responding MD:  Dr. Teryl Lucy  Time MD responded: [phone]

## 2014-04-20 NOTE — Evaluation (Signed)
Clinical/Bedside Swallow Evaluation Patient Details  Name: [name] [name] MRN: [id number] Date of Birth: 12/26/1950  Today's Date: 04/20/2014 Time: 4008-6761 SLP Time Calculation (min): 33 min  Past Medical History:  Past Medical History  Diagnosis Date  . Crohn's disease   . Hypertension   . Hyperlipidemia   . CHF (congestive heart failure)     EF 30-35% 2012->EF 60-65% 2013  . GERD (gastroesophageal reflux disease)   . 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 Anxiety   . Panic attacks   . CKD (chronic kidney disease), stage III   . Anemia, chronic renal failure   . Asthma 05/2011  . Headache(784.0)     "related to high BP" (05/30/2012)  . Tobacco abuse   . Abnormal chest CT     Coronary atherosclerosis on chest CT 2012   Past Surgical History:  Past Surgical History  Procedure Laterality Date  . 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 Ectopic pregnancy surgery  ?1980's    left  . Cholecystectomy  01/28/2005  . Cataract extraction w/ intraocular lens  implant, bilateral  ~ 2000  . Ileostomy  ?  2002   HPI:  Patient presents with history of GERD, left facial swelling, tenderness and fever. She reports recent symptoms of upper respiratory tract infection, no prior salivary gland abnormality. She reports a three-day history of progressive swelling in the left cheek and perimandibular region, seen in the emergency department with complaints of pain, difficulty swallowing and fever. CT scan shows diffuse swelling and inflammatory changes involving the left parotid and submandibular glands, no evidence of stone, obstruction or abscess, left neck adenopathy. Per ENT, patient with acute left parotitis which may be related to recent upper respiratory tract infection and dehydration.   Assessment / Plan / Recommendation Clinical Impression  Patient presents with a mild-moderate primarily oral dysphagia with suspected pharyngeal component. Decreased oral control of bolus noted appearing  related to both significant left sided facial edema (decreased labial, lingual, and mandibular ROM) and patient reported pain with oral movements. SLP suspects that this results in a delay in swallow initiation and when combined with possible pharyngeal edema and decreased glottal closure (dysphonia) results in intermittent penetration of liquids. Patient appears to have intact sensation responding with intermittent throat clear which quickly clears vocal quality. Although risk of aspiration moderate at this time, patient appears to be compensating relatively well for deficits with minimal SLP cueing for small single sips and oral direction of bolus to left buccal cavity for easier and more comfortable bolus manipulation and transit. Hopeful that swallowing function will only improve as swelling decreases as patient does not report a h/o dysphagia prior to acute illness. Will f/u closely at bedside for tolerance and potential to advance diet.     Aspiration Risk  Moderate    Diet Recommendation Dysphagia 1 (Puree);Thin liquid   Liquid Administration via: Cup;No straw Medication Administration: Crushed with puree Supervision: Patient able to self feed;Full supervision/cueing for compensatory strategies Compensations: Slow rate;Small sips/bites Postural Changes and/or Swallow Maneuvers: Seated upright 90 degrees    Other  Recommendations Oral Care Recommendations: Oral care BID   Follow Up Recommendations   (TBD)    Frequency and Duration min 3x week  2 weeks   Pertinent Vitals/Pain n/a        Swallow Study    General HPI: Patient presents with history of GERD, left facial swelling, tenderness and fever. She reports recent symptoms of upper respiratory tract infection, no prior  salivary gland abnormality. She reports a three-day history of progressive swelling in the left cheek and perimandibular region, seen in the emergency department with complaints of pain, difficulty swallowing and  fever. CT scan shows diffuse swelling and inflammatory changes involving the left parotid and submandibular glands, no evidence of stone, obstruction or abscess, left neck adenopathy. Per ENT, patient with acute left parotitis which may be related to recent upper respiratory tract infection and dehydration. Type of Study: Bedside swallow evaluation Previous Swallow Assessment: none Diet Prior to this Study: Other (Comment);Thin liquids (clear liquids) Temperature Spikes Noted: No Respiratory Status: Room air History of Recent Intubation: No Behavior/Cognition: Alert;Cooperative;Pleasant mood;Distractible (anxious) Self-Feeding Abilities: Able to feed self Patient Positioning: Upright in bed Baseline Vocal Quality: Hoarse;Low vocal intensity Volitional Cough: Strong Volitional Swallow: Able to elicit    Oral/Motor/Sensory Function Overall Oral Motor/Sensory Function: Impaired Labial ROM: Reduced left Labial Symmetry: Within Functional Limits Labial Strength: Within Functional Limits Labial Sensation: Within Functional Limits Lingual ROM: Reduced left Lingual Symmetry: Within Functional Limits Lingual Strength: Within Functional Limits Lingual Sensation: Within Functional Limits Facial ROM: Reduced left Facial Symmetry: Other (Comment) (significant swelling along left cheek and jay line) Facial Strength: Within Functional Limits Facial Sensation: Within Functional Limits Velum: Within Functional Limits Mandible: Impaired (decreased ROM due to swelling)   Ice Chips Ice chips: Not tested   Thin Liquid Thin Liquid: Impaired Presentation: Cup;Self Fed;Straw Oral Phase Impairments: Impaired anterior to posterior transit Oral Phase Functional Implications: Prolonged oral transit (mild loss of oral control due to pain with oral transit) Pharyngeal  Phase Impairments: Throat Clearing - Immediate;Wet Vocal Quality (intermittent, worse with straw sips)    Nectar Thick Nectar Thick Liquid: Not  tested   Honey Thick Honey Thick Liquid: Not tested   Puree Puree: Impaired Presentation: Spoon Oral Phase Impairments: Impaired anterior to posterior transit (pain with mastication on left) Oral Phase Functional Implications: Prolonged oral transit   Solid   GO   Kavion Mancinas MA, CCC-SLP [phone]  Solid: Not tested       Wilma Michaelson Meryl 04/20/2014,2:57 PM

## 2014-04-20 NOTE — Discharge Summary (Signed)
[facility] Discharge Summary  Patient name: Danetra Glock Medical record number: [id number] Date of birth: [birth year] Age: 63 y.o. Gender: female Date of Admission: 04/19/2014  Date of Discharge: 04/25/14 Admitting Physician: Lind Covert, MD  Primary Care Provider: Zigmund Gottron, MD Consultants: ENT   Indication for Hospitalization: L sided facial/neck swelling  Discharge Diagnoses/Problem List:  Sepsis 2/2 L sided sialoadenitis/Parotiditis Crohns disease AKI on CKD stage III AG Metabolic acidosis - resolved Hypotension with h/o HTN Hyponatrremia - resolved Hypokalemia - resolved Normocytic anemia CAD HLD H/o viral myocarditis Anxiety  Disposition: Home  Discharge Condition: Stable  Discharge Exam:  Blood pressure 137/52, pulse 65, temperature 98.6 F (37 C), temperature source Oral, resp. rate 18, height 5' 6"  (1.676 m), weight 142 lb 4.8 oz (64.547 kg), SpO2 97.00%. General: Pleasant AAF laying in hospital bed in NAD. Appears anxious.  HEENT: Large mass (decreasing in size) at left mandible that is much less tender and warm without erythema. Not fluctuant. Swelling in neck notably decreased from admission.  Cardiovascular: RRR, no m/r/g.  Respiratory: CTAB, no w/r/c. Good air movement. Normal WOB  Abdomen: Soft, NDNT, ostomy with good output. No erythema around ostomy. No frank blood in ostomy  Extremities: WWP, 1+ edema in b/l feet. No calf tenderness  Neuro: No focal deficits. Moves all extremities.   Brief Hospital Course:   [name] [name] is a 63 y.o. female presenting with sepsis 2/2 L sided sialoadenitis. PMH is significant for Crohns disease s/p colostomy in 1997, CKD stage III (baseline CR 1.7-2.1), HTN, HLD, CAD, h/o viral myocarditis, anxiety.   #Sepsis 2/2 L sided Sialoadenitis/Parotiditis: Patient presenting with L sided facial/neck swelling and TTP.  CT maxillofacial showed Left-sided sialoadenitis, involving L  submandibular & inferior parotid glands and no evidence of abscess. Patient initially febrile with Tmax 102.5 and had leukocytosis to 20.2. No respiratory compromise. Patient started on IV Clindamycin.  On day after admission, was switched to Vancomycin and Meropenem (later switched to Imipenem 2/2 Meropenem shortage).  ENT was consulted on admission and recommended continued IV antibiotics with eventual transition to PO with discharge when stable. After patient afebrile x3d, leukocytosis resolved, and clinically improving, transition made to PO Clindamycin and Ciprofloxacin.  Patient was given IV Fentanyl (morphine allergy) initially for pain control, but was switched to tylenol before discharge.  BCx collected after antibiotic initiation, but had no growth.  Patient stable and still improving at time of discharge.  #Crohns disease: Patient reported increased liquid ostomy output x1 day prior to admission. No abdominal pain or blood in ostomy output.  Output improved and no more liquid output was noted throughout admission.  Home Mesalamine was continue throughout admission.  Home Magnesium was held to avoid laxative effect.  #AKI on CKD Stage III and Anion Gap Metabolic Acidosis - Patient initially presenting with AKI on CKD (Creatinine as high as 9.37) and AG metabolic acidosis (AG as high as 22, bicarb as low as 9).  Lactic acid (0.8) wnl.  Patient placed on D5 with 147mq of Na bicarb.  ABG with pO2 of 59 and normal pH. Appears to be respiratory compensation for metabolic acidosis.  AKI was thought to be of prerenal etiology given patient's decreased PO intake and increased ostomy output.  All nephrotoxic agents were avoided. Anion gap closed and bicarb normalized, so bicarb in IVF was d/c'd.  Creatinine returned to baseline (1.7-2.1) after IVF prior to discharge.  #Mild hypotension with h/o HTN - Patient with hypotension to 70s/40s during  beginning of admission.  She was treated with IVF and fluid  boluses with some improvement.  Home Metoprolol was held throughout admission and on discharge.  There was concern for adrenal insufficiency 22/ h/o intermittent prednisone use.  Prednisone 31m daily was started.  Random cortisol (collected in PM) was 24.1.  Prednisone was decreased to 452mand tapered off over 1 week.  BP also improved with antibiotic treatment of infection.  BP was stable on discharge.   # Hyponatremia - Na 134 on admission.  Likely hypovolemic hyponatremia related to decreased PO intake and increased ostomy output. Resolved with IVF.  # Hypokalemia: K critically low at 2.8 on 10/17. Resolved s/p repletion with 40 and then another 2022mof PO Kdur. Remained wnl during remained of admission and stable on discharge.  # Normocytic Anemia: Hemoglobin elevated to 12.5 (baseline 10-11) on admission, likely hemoconcentrated 2/2 dehydration.  It then downtrended to ~8.5 with IVF, possibly due to aggressive IVF resuscitation. No blood from ostomy. No hematemesis.  Hemoglobin remained stable around 8.5 for remained of admission.   #Anxiety: Mood was stable on admission.  Patient had 15 sec shaking episode not concerning for seizure, likely anxiety related on 10/18. This occurred after fighting with family members.  Patient continued to be very anxious about IV pole beeping, BP being taken, etc throughout remainder of admission. SW was consulted for family strife, though patient reported that she would feel better if she just got out of the hospital.  Home celexa was continued throughout admission, and home ativan was increased to TID prn dosing.  Patient was resumed on home meds on discharge.   All other chronic medical conditions were stable throughout admission and managed with home regimens.  Issues for Follow Up:  1. F/u sialadenitis s/p antibiotic therapy 2. F/u HTN: Holding home Metoprolol 2/2 hypotension during admission.  BP normalized before discharge. 3. F/u normocytic anemia:  Hemoglobin downtrending, but could be 2/2 IVF. 4. Anxiety: Patient very anxious during admission and required home prn ativan.  5. Patient needs [facility] and PT services (ordered at discharge).   Significant Procedures: None  Significant Labs and Imaging:   Recent Labs Lab 04/23/14 0550 04/24/14 1016 04/25/14 0531  WBC 12.5* 9.4 12.3*  HGB 8.2* 9.0* 8.8*  HCT 24.4* 26.8* 26.8*  PLT 204 205 239    Recent Labs Lab 04/21/14 2223 04/22/14 0500 04/23/14 0550 04/24/14 1016 04/25/14 0531  NA 138 141 145 139 142  K 4.2 4.0 3.7 3.5* 4.0  CL 104 103 102 100 104  CO2 20 25 30 26 26   GLUCOSE 145* 144* 101* 119* 84  BUN 30* 25* 21 22 28*  CREATININE 2.13* 1.88* 1.47* 1.38* 1.55*  CALCIUM 7.6* 7.8* 7.7* 7.9* 8.1*    Lactic acid 0.8   Bcx: No growth  Imaging/Diagnostic Tests:  CT Maxillofacial (10/15): Left-sided sialoadenitis, involving the left submandibular gland and inferior provided gland. Inflammatory changes result in thickening of the left platysma in the overlying soft tissues, with asymmetry  of the facial contour. No sialolith identified. No focal fluid collection to suggest abscess.  The patient has endodontal disease, though there are no periapical  lucencies to suggest abscess tooth.   US [place] tissue head/neck (10/18): Previously described left parotid and submandibular sialoadenitis with associated reactive adenopathy. No abscess seen.   Results/Tests Pending at Time of Discharge: None  Discharge Medications:    Medication List    STOP taking these medications       metoprolol  succinate 25 MG 24 hr tablet  Commonly known as:  TOPROL-XL      TAKE these medications       acetaminophen 500 MG tablet  Commonly known as:  TYLENOL  Take 1,000 mg by mouth every 6 (six) hours as needed for pain.     ASACOL HD 800 MG Tbec  Generic drug:  Mesalamine  Take 800 mg by mouth 3 (three) times daily.     calcium citrate 950 MG tablet  Commonly known as:  CALCITRATE  - dosed in mg elemental calcium  Take 1 tablet (200 mg of elemental calcium total) by mouth 2 (two) times daily.     ciprofloxacin 750 MG tablet  Commonly known as:  CIPRO  Take 1 tablet (750 mg total) by mouth 2 (two) times daily.     citalopram 20 MG tablet  Commonly known as:  CELEXA  Take 20 mg by mouth daily.     clindamycin 150 MG capsule  Commonly known as:  CLEOCIN  Take 3 capsules (450 mg total) by mouth every 8 (eight) hours.     clopidogrel 75 MG tablet  Commonly known as:  PLAVIX  Take 1 tablet (75 mg total) by mouth daily with breakfast.     LORazepam 0.5 MG tablet  Commonly known as:  ATIVAN  Take 0.5 mg by mouth 2 (two) times daily as needed for anxiety.     magnesium oxide 400 (241.3 MG) MG tablet  Commonly known as:  MAG-OX  Take 1 tablet (400 mg total) by mouth daily.     predniSONE 10 MG tablet  Commonly known as:  DELTASONE  Take 1 tablet (10 mg total) by mouth daily with breakfast.  Start taking on:  04/26/2014     simvastatin 20 MG tablet  Commonly known as:  ZOCOR  Take 20 mg by mouth daily.        Discharge Instructions: Please refer to Patient Instructions section of EMR for full details.  Patient was counseled important signs and symptoms that should prompt return to medical care, changes in medications, dietary instructions, activity restrictions, and follow up appointments.   Follow-Up Appointments: Follow-up Information   Follow up with Zigmund Gottron, MD On 05/04/2014. (Appointment made for hospital follow-up on 10/30 at 10am.)    Specialty:  Family Medicine   Contact information:   [place] [address] [phone]       Lavon Paganini, MD 04/25/2014, 1:51 PM PGY-1, [place]

## 2014-04-20 NOTE — Progress Notes (Signed)
CRITICAL VALUE ALERT  Critical value received:  CO2   Date of notification:  .04/20/14  Time of notification:  0607  Critical value read back:Yes.    Nurse who received alert:  Dorothea Glassman, RN  MD notified (1st page):  On Call Notified  Time of first page:  0612  Responding MD:  Lavon Paganini, MD  Time MD responded:  [phone]

## 2014-04-20 NOTE — Progress Notes (Signed)
FMTS Attending Daily Note:  Annabell Sabal MD  [phone] pager  Family Practice pager:  [phone] I have discussed this patient with the resident Dr. Jacinto Reap and attending physician Dr. Erin Hearing.  I agree with their findings, assessment, and care plan

## 2014-04-20 NOTE — H&P (Signed)
Family Medicine Teaching Service Attending Note  I interviewed and examined patient [name] [name] and reviewed their tests and x-rays.  I discussed with Dr. Raliegh Ip and reviewed their note for today.  I agree with their assessment and plan.     Additionally  Feeling slightly better No swallowing or respiratory problems  Continue IV Antibiotics Consult ENT

## 2014-04-21 DIAGNOSIS — D631 Anemia in chronic kidney disease: Secondary | ICD-10-CM

## 2014-04-21 DIAGNOSIS — N189 Chronic kidney disease, unspecified: Secondary | ICD-10-CM

## 2014-04-21 DIAGNOSIS — E860 Dehydration: Secondary | ICD-10-CM

## 2014-04-21 DIAGNOSIS — N183 Chronic kidney disease, stage 3 (moderate): Secondary | ICD-10-CM

## 2014-04-21 DIAGNOSIS — J449 Chronic obstructive pulmonary disease, unspecified: Secondary | ICD-10-CM

## 2014-04-21 LAB — CBC
HCT: 25.6 % — ABNORMAL LOW (ref 36.0–46.0)
HEMATOCRIT: 23.5 % — AB (ref 36.0–46.0)
Hemoglobin: 8.7 g/dL — ABNORMAL LOW (ref 12.0–15.0)
Hemoglobin: 8 g/dL — ABNORMAL LOW (ref 12.0–15.0)
MCH: 31.1 pg (ref 26.0–34.0)
MCH: 30.5 pg (ref 26.0–34.0)
MCHC: 34 g/dL (ref 30.0–36.0)
MCHC: 34 g/dL (ref 30.0–36.0)
MCV: 91.4 fL (ref 78.0–100.0)
MCV: 89.7 fL (ref 78.0–100.0)
PLATELETS: 140 10*3/uL — AB (ref 150–400)
Platelets: 156 10*3/uL (ref 150–400)
RBC: 2.8 MIL/uL — AB (ref 3.87–5.11)
RBC: 2.62 MIL/uL — ABNORMAL LOW (ref 3.87–5.11)
RDW: 14.3 % (ref 11.5–15.5)
RDW: 14.4 % (ref 11.5–15.5)
WBC: 13.6 10*3/uL — AB (ref 4.0–10.5)
WBC: 11.3 10*3/uL — ABNORMAL HIGH (ref 4.0–10.5)

## 2014-04-21 LAB — BASIC METABOLIC PANEL
ANION GAP: 16 — AB (ref 5–15)
ANION GAP: 15 (ref 5–15)
Anion gap: 14 (ref 5–15)
BUN: 34 mg/dL — ABNORMAL HIGH (ref 6–23)
BUN: 34 mg/dL — AB (ref 6–23)
BUN: 30 mg/dL — AB (ref 6–23)
CO2: 18 meq/L — AB (ref 19–32)
CO2: 21 mEq/L (ref 19–32)
CO2: 20 mEq/L (ref 19–32)
CREATININE: 2.13 mg/dL — AB (ref 0.50–1.10)
Calcium: 7.9 mg/dL — ABNORMAL LOW (ref 8.4–10.5)
Calcium: 7.6 mg/dL — ABNORMAL LOW (ref 8.4–10.5)
Calcium: 7.6 mg/dL — ABNORMAL LOW (ref 8.4–10.5)
Chloride: 100 mEq/L (ref 96–112)
Chloride: 97 mEq/L (ref 96–112)
Chloride: 104 mEq/L (ref 96–112)
Creatinine, Ser: 2.73 mg/dL — ABNORMAL HIGH (ref 0.50–1.10)
Creatinine, Ser: 2.58 mg/dL — ABNORMAL HIGH (ref 0.50–1.10)
GFR calc Af Amer: 21 mL/min — ABNORMAL LOW (ref >90)
GFR calc Af Amer: 22 mL/min — ABNORMAL LOW (ref >90)
GFR calc non Af Amer: 18 mL/min — ABNORMAL LOW (ref >90)
GFR calc non Af Amer: 24 mL/min — ABNORMAL LOW (ref >90)
GFR, EST AFRICAN AMERICAN: 28 mL/min — AB (ref >90)
GFR, EST NON AFRICAN AMERICAN: 19 mL/min — AB (ref >90)
GLUCOSE: 138 mg/dL — AB (ref 70–99)
Glucose, Bld: 160 mg/dL — ABNORMAL HIGH (ref 70–99)
Glucose, Bld: 145 mg/dL — ABNORMAL HIGH (ref 70–99)
POTASSIUM: 3 meq/L — AB (ref 3.7–5.3)
POTASSIUM: 2.8 meq/L — AB (ref 3.7–5.3)
Potassium: 4.2 mEq/L (ref 3.7–5.3)
SODIUM: 134 meq/L — AB (ref 137–147)
Sodium: 133 mEq/L — ABNORMAL LOW (ref 137–147)
Sodium: 138 mEq/L (ref 137–147)

## 2014-04-21 LAB — CORTISOL: CORTISOL PLASMA: 24.1 ug/dL

## 2014-04-21 MED ORDER — SODIUM CHLORIDE 0.9 % IV SOLN
Freq: Once | INTRAVENOUS | Status: AC
  Administered 2014-04-21: 19:00:00 via INTRAVENOUS

## 2014-04-21 MED ORDER — OXYCODONE-ACETAMINOPHEN 5-325 MG PO TABS: 1.0000 | ORAL_TABLET | ORAL | Status: DC | PRN

## 2014-04-21 MED ORDER — TRAMADOL HCL 50 MG PO TABS
50.0000 mg | ORAL_TABLET | Freq: Two times a day (BID) | ORAL | Status: DC | PRN
  Administered 2014-04-21: 50 mg via ORAL
  Filled 2014-04-21: qty 1

## 2014-04-21 MED ORDER — POTASSIUM CHLORIDE 20 MEQ/15ML (10%) PO LIQD
20.0000 meq | Freq: Once | ORAL | Status: AC
  Administered 2014-04-21: 20 meq via ORAL
  Filled 2014-04-21: qty 15

## 2014-04-21 MED ORDER — SODIUM CHLORIDE 0.9 % IV SOLN
Freq: Once | INTRAVENOUS | Status: AC
  Administered 2014-04-21: 13:00:00 via INTRAVENOUS

## 2014-04-21 MED ORDER — POTASSIUM CHLORIDE 10 MEQ/100ML IV SOLN
10.0000 meq | INTRAVENOUS | Status: DC
  Administered 2014-04-21: 10 meq via INTRAVENOUS
  Filled 2014-04-21 (×4): qty 100

## 2014-04-21 MED ORDER — SODIUM CHLORIDE 0.9 % IV BOLUS (SEPSIS)
1000.0000 mL | Freq: Once | INTRAVENOUS | Status: AC
  Administered 2014-04-21: 1000 mL via INTRAVENOUS

## 2014-04-21 MED ORDER — POTASSIUM CHLORIDE 20 MEQ/15ML (10%) PO LIQD
40.0000 meq | Freq: Once | ORAL | Status: AC
  Administered 2014-04-21: 40 meq via ORAL
  Filled 2014-04-21: qty 30

## 2014-04-21 MED ORDER — PREDNISONE 50 MG PO TABS
60.0000 mg | ORAL_TABLET | Freq: Every day | ORAL | Status: DC
  Administered 2014-04-21 – 2014-04-22 (×2): 60 mg via ORAL
  Filled 2014-04-21 (×3): qty 1

## 2014-04-21 NOTE — Progress Notes (Signed)
Pt B/P 88/58 at 1247pm. Pt was Alert and oriented X4. MD made aware. Pt given 500 ml NS bolus per MD order.  BP 91/52 at 1408pm. Will continue to monitor.

## 2014-04-21 NOTE — Progress Notes (Signed)
Speech Language Pathology Treatment: Dysphagia  Patient Details Name: [name] [name] MRN: [id number] DOB: [birth year] Today's Date: 04/21/2014 Time: 0500-0515 SLP Time Calculation (min): 15 min  Assessment / Plan / Recommendation Clinical Impression  Pt tolerating Dysphagia 1/thin with swallowing precautions utilized including use of right buccal cavity for presentation of liquids/puree consistency with SLP verbal cues initially and then independent use by pt d/t pain on left side rated as an 8; pt continues with hoarse vocal quality, intermittent throat clearing during presentation of liquids/puree independently; pt utilizing small bites/sips with minimal verbal cueing from SLP; continue Dysphagia 1 with thin liquids utilizing swallow precautions during meals/snacks   HPI HPI: Patient presents with history of GERD, left facial swelling, tenderness and fever. She reports recent symptoms of upper respiratory tract infection, no prior salivary gland abnormality. She reports a three-day history of progressive swelling in the left cheek and perimandibular region, seen in the emergency department with complaints of pain, difficulty swallowing and fever. CT scan shows diffuse swelling and inflammatory changes involving the left parotid and submandibular glands, no evidence of stone, obstruction or abscess, left neck adenopathy. Per ENT, patient with acute left parotitis which may be related to recent upper respiratory tract infection and dehydration.   Pertinent Vitals Pain Assessment: 0-10 Pain Score: 8  Pain Location: left ear Pain Descriptors / Indicators: Aching;Constant Pain Intervention(s): Heat applied;Repositioned;Monitored during session (informed RN; BP very low without meds)  SLP Plan  Continue with current plan of care    Recommendations Diet recommendations: Thin liquid;Dysphagia 1 (puree) Liquids provided via: Cup;No straw Medication Administration: Crushed with puree Supervision:  Patient able to self feed;Full supervision/cueing for compensatory strategies Compensations: Slow rate;Small sips/bites Postural Changes and/or Swallow Maneuvers: Seated upright 90 degrees              Oral Care Recommendations: Oral care BID Plan: Continue with current plan of care         Tavyn Kurka,PAT, M.S., CCC-SLP 04/21/2014, 5:19 PM

## 2014-04-21 NOTE — Progress Notes (Signed)
Called by RN Carlyn Reichert for persistently low BP after now 2 500cc boluses, at 80s/50s. Cortisol level is still pending. Will bolus 1L NS and dose prednisone 8m daily empirically while cortisol level still pending.   K also critically low at 2.8 after kdur 467m (which was burning through IV so was dosed PO only about an hour prior to BMET being drawn). Will dose another 2030mand check BMET at 10pm.  MarHilton SinclairD

## 2014-04-21 NOTE — Progress Notes (Signed)
Family Medicine Teaching Service Daily Progress Note Intern Pager: [phone]  Patient name: [name] [name] Medical record number: [id number] Date of birth: December 17, 1950 Age: 63 y.o. Gender: female  Primary Care Provider: Zigmund Gottron, MD Consultants: ENT Code Status: Full code  Pt Overview and Major Events to Date:  10/15 - admit to [place] for L neck/facial swelling  Assessment and Plan: Diamonds Lippard is a 63 y.o. female presenting with sepsis 2/2 L sided sialoadenitis. PMH is significant for Crohns disease s/p colostomy in 1997, CKD stage III (baseline CR 1.7-2.1), HTN, HLD, CAD, h/o viral myocarditis, anxiety.   #Sepsis 2/2 L sided Sialoadenitis/Parotiditis: CT with Left-sided sialoadenitis, involving L submandibular & inferior parotid glands, no evidence of abscess. Febrile Tmax 102.5, WBC 20.2 > 21.1>13.6. No respiratory compromise. ENT recommends continued IV antibiotics with eventual transition to PO with discharge when stable. Patient has been febrile overnight with a Tmax of 102.8. ABG with pO2 of 59 and normal pH. Appears to be respiratory compensation for metabolic acidosis. - switched to meropenem and vancomycin, will continue - consider ultrasound today to assess for abscess since patient still spiking fevers (has been on abx for about 24 hours) - continue IV Fentanyl 33m q2h prn for pain control  - Tylenol PO or PR prn fever  - Zofran PO or IV prn nausea   - Closely monitor resp status given neck swelling with acute onset and worsening x 3 days  - f/u BCx (drawn after initiation of abx) - [place] O2 @2L  since low pO2 on ABG  #Crohns disease: Reported increased liquid ostomy output x1 day on admission. No abdominal pain or blood in ostomy output.  - Continue home Mesalamine  - Will hold home Magnesium, unclear why patient is taking this. Avoid laxative effect currently  - Consult to wound/ostomy for colostomy care   #AKI on CKD Stage III - Cr 2.67 > 2.88 >2.97>2.73 (baseline  1.7-2.1). Likely prerenal given decreased PO intake and increased ostomy output recently.  - Avoid all nephrotoxic agents  - Continue to monitor  - Continue IVF (see below)  #Anion gap metabolic acidosis - AG 117>00>17>49 bicarb 12>9>10->18. likely related to AKI on CKD. Bicarb has been low in the past as well.  - Lactic acid wnl (0.8) - Continue D5 with 1527m Na bicarb @125mL /hr  #Mild hypotension with h/o HTN - BP 92-120/64-80 O/N.  - Holding home Metoprolol for hypotension  - Continue to monitor BP - Will bolus with 50031mS this AM   # Hyponatremia - Improving. Na 133>135. Likely hypovolemic hyponatremia related to decreased PO intake and increased ostomy output.  - Continue IVF as above  - Continue to monitor  # Hypokalemia: down to 3.0 - potassium 5m87m4  # Normocytic Anemia: Hgb 12.5>10.7>8.7.  Seems to be around baseline (10-11).  All blood lines decreased. Possibly due to aggressive IVF resuscitation. No blood from ostomy. No hematemesis.  - repeat CBC this afternoon  #CAD, HLD, h/o viral myocarditis: No CP or SOB.  - Continue home plavix and Zocor   #Anxiety: Mood stable.  - Continue home celexa and ativan 0.5mg 41mBID prn   FEN/GI: dysphagia 1diet, D5 with 150mEq61mbicarb @150mL /hr  Prophylaxis: subq heparin   Disposition: Pending improvement with no fevers and transition to PO medications  Subjective:  Patient reports that pain is better controlled with Fentanyl.  She has been tolerating her diet. No other complaints overnight. She has been spiking fevers up to 102.8 with continued low blood pressures  with systolics in the 82M that mildly responded to a 563m bolus.  Objective: Temp:  [98.7 F (37.1 C)-102.8 F (39.3 C)] 98.7 F (37.1 C) (10/17 0642) Pulse Rate:  [97-120] 108 (10/17 0427) Resp:  [16-20] 18 (10/17 0427) BP: (89-95)/(47-58) 94/56 mmHg (10/17 0427) SpO2:  [97 %-100 %] 97 % (10/17 0427) Physical Exam: General: Pleasant AAF laying in  hospital bed in NAD. Hoarse voice. Appears tired HEENT: Large mass at left mandible that is very tender and warm without erythema.  Cardiovascular: RRR, no m/r/g.  Respiratory: CTAB, no w/r/c. Good air movement. Normal WOB Abdomen: Soft, NDNT, ostomy with liquid output and air filled bag. No erythema around ostomy. No frank blood in ostomy  Extremities: WWP, no edema/cyanosis  Neuro: No focal deficits. Moves all extremities.   Laboratory:  Recent Labs Lab 04/19/14 1754 04/20/14 0456 04/21/14 0549  WBC 20.2* 21.1* 13.6*  HGB 12.5 10.7* 8.7*  HCT 37.7 31.7* 25.6*  PLT 191 181 156    Recent Labs Lab 04/20/14 0456 04/20/14 1855 04/21/14 0549  NA 135* 134* 134*  K 4.3 4.4 3.0*  CL 105 102 100  CO2 9* 10* 18*  BUN 32* 36* 34*  CREATININE 2.88* 2.97* 2.73*  CALCIUM 9.3 8.9 7.9*  GLUCOSE 101* 150* 138*    Lactic acid 0.8  Imaging/Diagnostic Tests: CT Maxillofacial (10/15): Left-sided sialoadenitis, involving the left submandibular gland and inferior provided gland. Inflammatory changes result in thickening of the left platysma in the overlying soft tissues, with asymmetry  of the facial contour. No sialolith identified. No focal fluid collection to suggest abscess.  The patient has endodontal disease, though there are no periapical  lucencies to suggest abscess tooth.   RCordelia Poche MD 04/21/2014, 9:15 AM PGY-2, [facility] pager: [phone] text pages welcome

## 2014-04-21 NOTE — Progress Notes (Signed)
Seen and examined.  Agree with the documentation and management of Dr. Lonny Prude.  Perhaps some better.  Still has not turned the corner.  Continue inpatient treatment.

## 2014-04-21 NOTE — Progress Notes (Signed)
CRITICAL VALUE ALERT  Critical value received:  Potassium 2.8  Date of notification:  04/21/14  Time of notification:  5790  * Critical value read back:Yes.    Nurse who received alert:  Carole Civil  MD notified (1st page):  Time of first page:  1538 pm   Responding MD:   Dr.  Dianah Field  Time MD responded:  1543pm

## 2014-04-22 ENCOUNTER — Inpatient Hospital Stay (HOSPITAL_COMMUNITY): Payer: PRIVATE HEALTH INSURANCE

## 2014-04-22 DIAGNOSIS — F419 Anxiety disorder, unspecified: Secondary | ICD-10-CM

## 2014-04-22 LAB — BASIC METABOLIC PANEL
Anion gap: 13 (ref 5–15)
BUN: 25 mg/dL — AB (ref 6–23)
CO2: 25 mEq/L (ref 19–32)
CREATININE: 1.88 mg/dL — AB (ref 0.50–1.10)
Calcium: 7.8 mg/dL — ABNORMAL LOW (ref 8.4–10.5)
Chloride: 103 mEq/L (ref 96–112)
GFR, EST AFRICAN AMERICAN: 32 mL/min — AB (ref >90)
GFR, EST NON AFRICAN AMERICAN: 28 mL/min — AB (ref >90)
Glucose, Bld: 144 mg/dL — ABNORMAL HIGH (ref 70–99)
Potassium: 4 mEq/L (ref 3.7–5.3)
Sodium: 141 mEq/L (ref 137–147)

## 2014-04-22 LAB — CBC
HCT: 25.2 % — ABNORMAL LOW (ref 36.0–46.0)
Hemoglobin: 8.5 g/dL — ABNORMAL LOW (ref 12.0–15.0)
MCH: 30.7 pg (ref 26.0–34.0)
MCHC: 33.7 g/dL (ref 30.0–36.0)
MCV: 91 fL (ref 78.0–100.0)
PLATELETS: 191 10*3/uL (ref 150–400)
RBC: 2.77 MIL/uL — ABNORMAL LOW (ref 3.87–5.11)
RDW: 14.3 % (ref 11.5–15.5)
WBC: 10.5 10*3/uL (ref 4.0–10.5)

## 2014-04-22 MED ORDER — PREDNISONE 20 MG PO TABS
40.0000 mg | ORAL_TABLET | Freq: Every day | ORAL | Status: DC
Start: 2014-04-23 — End: 2014-04-24
  Administered 2014-04-23 – 2014-04-24 (×2): 40 mg via ORAL
  Filled 2014-04-22 (×3): qty 2

## 2014-04-22 MED ORDER — SODIUM CHLORIDE 0.45 % IV SOLN
INTRAVENOUS | Status: DC
  Administered 2014-04-22: 17:00:00 via INTRAVENOUS
  Administered 2014-04-23: 150 mL/h via INTRAVENOUS
  Administered 2014-04-23: 13:00:00 via INTRAVENOUS

## 2014-04-22 NOTE — Progress Notes (Signed)
Seen and examined.  Agree with the documentation and management of Dr. B.  Clinically improving.  I am concerned about possibility of abscess and have ordered an ultrasound.

## 2014-04-22 NOTE — Progress Notes (Signed)
Patient speaking with family and started to become short of breath and anxious.  Patient began to have jerking activity, but remained conscious.  The activity lasted around 15 seconds.  MD notified.  Will continue to monitor.

## 2014-04-22 NOTE — Progress Notes (Signed)
Patient IV infiltrated on the right side.  Site puffy and patient states that it stings when touched.  Moved IV site to left arm, will monitor swelling.

## 2014-04-22 NOTE — Progress Notes (Signed)
Family Medicine Teaching Service Daily Progress Note Intern Pager: [phone]  Patient name: [name] [name] Medical record number: [id number] Date of birth: May 31, 1951 Age: 63 y.o. Gender: female  Primary Care Provider: Zigmund Gottron, MD Consultants: ENT Code Status: Full code  Pt Overview and Major Events to Date:  10/15 - admit to [facility] for L neck/facial swelling  Assessment and Plan: [name] [name] is a 63 y.o. female presenting with sepsis 2/2 L sided sialoadenitis. PMH is significant for Crohns disease s/p colostomy in 1997, CKD stage III (baseline CR 1.7-2.1), HTN, HLD, CAD, h/o viral myocarditis, anxiety.   #Sepsis 2/2 L sided Sialoadenitis/Parotiditis: CT with Left-sided sialoadenitis, involving L submandibular & inferior parotid glands, no evidence of abscess. Febrile Tmax 102.5, leukocytosis resolved 20.2 > ...>10.5. No respiratory compromise. ENT recommends continued IV antibiotics with eventual transition to PO with discharge when stable. Patient has been afebrile O/N. ABG with pO2 of 59 and normal pH. Appears to be respiratory compensation for metabolic acidosis. - switched to meropenem and vancomycin, will continue - f/u US to assess for abscess - continue IV Fentanyl 673m q2h prn for pain control  - Tylenol PO or PR prn fever  - Zofran PO or IV prn nausea   - Closely monitor resp status given neck swelling with acute onset and worsening x 3 days  - f/u BCx (drawn after initiation of abx) - NGTD - [place] O2 @2L  since low pO2 on ABG  #Crohns disease: Reported increased liquid ostomy output x1 day on admission. No abdominal pain or blood in ostomy output.  - Continue home Mesalamine  - Will hold home Magnesium, unclear why patient is taking this. Avoid laxative effect currently  - Consult to wound/ostomy for colostomy care   #AKI on CKD Stage III - Resolved. Cr 2.67 > 2.88 >2.97>2.73 > 1.88 (baseline 1.7-2.1). Likely prerenal given decreased PO intake and increased ostomy output  recently.  - Avoid all nephrotoxic agents  - Continue to monitor  - Continue IVF (see below)  #Anion gap metabolic acidosis - Resolved. AG 18>21>22>16>13, bicarb 12>9>10->18>25. likely related to AKI on CKD. Bicarb has been low in the past as well.  - Lactic acid wnl (0.8) - will d/c bicarb in IVF  #Mild hypotension with h/o HTN - Improving. BP 114/61 this AM. S/p 1L NS bolus O/N.  - Holding home Metoprolol for hypotension  - Continue to monitor BP - Concern for adrenal insufficieny 2/2 h/o prednisone use.  AM cortisol 24.1.  Will decrease prednisone to 42mtoday.  # Hyponatremia - Resolved. Likely hypovolemic hyponatremia related to decreased PO intake and increased ostomy output.  - Continue IVF - Continue to monitor  # Hypokalemia: Resovled s/p 40 + 2- mEq PO repletion 10/17.  K 4.0 today. - Continue to monitor  # Normocytic Anemia: Stable. Hgb 12.5>10.7>8.7> 8.5.  Baseline (10-11).  All blood lines decreased. Possibly due to aggressive IVF resuscitation. No blood from ostomy. No hematemesis.  - Continue to monitor  #CAD, HLD, h/o viral myocarditis: No CP or SOB.  - Continue home plavix and Zocor   #Anxiety: Mood stable.  - Continue home celexa and ativan 0.73m17mO BID prn   FEN/GI: dysphagia 1diet, 1/2 NS @150mL /hr  Prophylaxis: subq heparin   Disposition: Pending improvement with no fevers and transition to PO medications  Subjective:  Patient reports that pain is well controlled.  She has been tolerating her diet. No other complaints overnight. Continued low BPs with systolics in the 80s37Dat responded well to  prednisone and/or 1L bolus O/N.  Objective: Temp:  [98.3 F (36.8 C)-100.3 F (37.9 C)] 98.5 F (36.9 C) (10/18 0925) Pulse Rate:  [82-104] 98 (10/18 0925) Resp:  [16-18] 17 (10/18 0925) BP: (85-131)/(43-61) 114/61 mmHg (10/18 0925) SpO2:  [98 %-100 %] 98 % (10/18 0925) Weight:  [141 lb 14.4 oz (64.365 kg)] 141 lb 14.4 oz (64.365 kg) (10/17 2015) Physical  Exam: General: Pleasant AAF laying in hospital bed in NAD.  HEENT: Large mass at left mandible that is very tender and warm without erythema. Not fluctuant. Swelling in neck notably decreased Cardiovascular: RRR, no m/r/g.  Respiratory: CTAB, no w/r/c. Good air movement. Normal WOB Abdomen: Soft, NDNT, ostomy with liquid output and air filled bag. No erythema around ostomy. No frank blood in ostomy  Extremities: WWP, no edema/cyanosis  Neuro: No focal deficits. Moves all extremities.   Laboratory:  Recent Labs Lab 04/21/14 0549 04/21/14 1440 04/22/14 0500  WBC 13.6* 11.3* 10.5  HGB 8.7* 8.0* 8.5*  HCT 25.6* 23.5* 25.2*  PLT 156 140* 191    Recent Labs Lab 04/21/14 1440 04/21/14 2223 04/22/14 0500  NA 133* 138 141  K 2.8* 4.2 4.0  CL 97 104 103  CO2 21 20 25   BUN 34* 30* 25*  CREATININE 2.58* 2.13* 1.88*  CALCIUM 7.6* 7.6* 7.8*  GLUCOSE 160* 145* 144*    Lactic acid 0.8  Imaging/Diagnostic Tests: CT Maxillofacial (10/15): Left-sided sialoadenitis, involving the left submandibular gland and inferior provided gland. Inflammatory changes result in thickening of the left platysma in the overlying soft tissues, with asymmetry  of the facial contour. No sialolith identified. No focal fluid collection to suggest abscess.  The patient has endodontal disease, though there are no periapical  lucencies to suggest abscess tooth.   Lavon Paganini, MD 04/22/2014, 1:37 PM PGY-1, [place] Intern pager: [phone], text pages welcome

## 2014-04-22 NOTE — Progress Notes (Deleted)
Pt complaining of abdominal cramping, and refuses any medication for the pain. Decreased the stimulus in the room by turning off the lights and ensuring it is quiet per patient request. Informed patient to call me directly or use the call light if she wants something for pain, pt verbalized understanding.

## 2014-04-22 NOTE — Progress Notes (Signed)
Called by RN Caryl Pina for shaking and anxiety.  Went to see patient and she reported feeling very upset and frustrated with granddaughter when she started feeling SOB.  RN reports that patient then had fixed gaze and rhythmic generalized shaking for 15 seconds.  No incontinence or tongue biting. No post-ictal state.  Patient A&Ox3, though seems very anxious, even becoming tearful when talking about situation with granddaughter. VSS.  A/P: Unlikely that this was seizure activity.  Likely related to anxiety.  Patient given dose of home prn Ativan for anxiety and calming down. - If another episode of seizure like acitivty, will consider EEG - Placed SW consult to discuss living situation and family discord. - Family updated on patient's hospital course - RN asked to page if this happens again or any further concerns   Lavon Paganini, MD, MPH PGY-1,  [facility] 04/22/2014 4:54 PM

## 2014-04-23 DIAGNOSIS — I959 Hypotension, unspecified: Secondary | ICD-10-CM

## 2014-04-23 LAB — BASIC METABOLIC PANEL
ANION GAP: 13 (ref 5–15)
BUN: 21 mg/dL (ref 6–23)
CHLORIDE: 102 meq/L (ref 96–112)
CO2: 30 mEq/L (ref 19–32)
Calcium: 7.7 mg/dL — ABNORMAL LOW (ref 8.4–10.5)
Creatinine, Ser: 1.47 mg/dL — ABNORMAL HIGH (ref 0.50–1.10)
GFR calc Af Amer: 44 mL/min — ABNORMAL LOW (ref >90)
GFR calc non Af Amer: 38 mL/min — ABNORMAL LOW (ref >90)
Glucose, Bld: 101 mg/dL — ABNORMAL HIGH (ref 70–99)
POTASSIUM: 3.7 meq/L (ref 3.7–5.3)
SODIUM: 145 meq/L (ref 137–147)

## 2014-04-23 LAB — CBC
HCT: 24.4 % — ABNORMAL LOW (ref 36.0–46.0)
Hemoglobin: 8.2 g/dL — ABNORMAL LOW (ref 12.0–15.0)
MCH: 30.9 pg (ref 26.0–34.0)
MCHC: 33.6 g/dL (ref 30.0–36.0)
MCV: 92.1 fL (ref 78.0–100.0)
Platelets: 204 10*3/uL (ref 150–400)
RBC: 2.65 MIL/uL — ABNORMAL LOW (ref 3.87–5.11)
RDW: 14.2 % (ref 11.5–15.5)
WBC: 12.5 10*3/uL — AB (ref 4.0–10.5)

## 2014-04-23 MED ORDER — IMIPENEM-CILASTATIN 250 MG IV SOLR
250.0000 mg | Freq: Three times a day (TID) | INTRAVENOUS | Status: DC
  Administered 2014-04-23 – 2014-04-24 (×3): 250 mg via INTRAVENOUS
  Filled 2014-04-23 (×6): qty 250

## 2014-04-23 MED ORDER — VANCOMYCIN HCL IN DEXTROSE 750-5 MG/150ML-% IV SOLN
750.0000 mg | INTRAVENOUS | Status: DC
  Administered 2014-04-23: 750 mg via INTRAVENOUS
  Filled 2014-04-23 (×2): qty 150

## 2014-04-23 NOTE — Progress Notes (Signed)
I have seen and examined this patient. I have discussed with Dr Brita Romp.  I agree with their findings and plans as documented in their progress note.

## 2014-04-23 NOTE — Progress Notes (Signed)
ANTIBIOTIC CONSULT NOTE - FOLLOW UP  Pharmacy Consult for Vancomycin & Imipenem Indication: Sialoadenitis/Parotitis  Allergies  Allergen Reactions  . Amoxicillin Anaphylaxis, Rash and Other (See Comments)    REACTION:Throat Swelling  . Aspirin Anaphylaxis, Itching and Rash  . Morphine And Related Anaphylaxis  . Penicillins Anaphylaxis and Rash  . Gabapentin Other (See Comments)    Patient had one time seizure shortly after stopping gabapentin  . Ace Inhibitors Other (See Comments) and Cough    ACE possibly associated with cough and switched to ARB.  Would be OK to re-challenge if needed.    Patient Measurements: Height: 5' 6"  (167.6 cm) Weight: 141 lb 14.4 oz (64.365 kg) IBW/kg (Calculated) : 59.3  Vital Signs: Temp: 98.6 F (37 C) (10/19 1025) Temp Source: Oral (10/19 1025) BP: 132/66 mmHg (10/19 1025) Pulse Rate: 94 (10/19 1025) Intake/Output from previous day: 10/18 0701 - 10/19 0700 In: 2587.5 [P.O.:240; I.V.:2047.5; IV Piggyback:300] Out: 775 [Urine:150; Stool:625] Intake/Output from this shift: Total I/O In: 120 [P.O.:120] Out: 200 [Stool:200]  Labs:  Recent Labs  04/21/14 1440 04/21/14 2223 04/22/14 0500 04/23/14 0550  WBC 11.3*  --  10.5 12.5*  HGB 8.0*  --  8.5* 8.2*  PLT 140*  --  191 204  CREATININE 2.58* 2.13* 1.88* 1.47*   Estimated Creatinine Clearance: 38.1 ml/min (by C-G formula based on Cr of 1.47). No results found for this basename: VANCOTROUGH, VANCOPEAK, VANCORANDOM, GENTTROUGH, GENTPEAK, GENTRANDOM, TOBRATROUGH, TOBRAPEAK, TOBRARND, AMIKACINPEAK, AMIKACINTROU, AMIKACIN,  in the last 72 hours   Microbiology: Recent Results (from the past 720 hour(s))  CULTURE, BLOOD (ROUTINE X 2)     Status: None   Collection Time    04/20/14  9:55 AM      Result Value Ref Range Status   Specimen Description BLOOD LEFT HAND   Final   Special Requests BOTTLES DRAWN AEROBIC AND ANAEROBIC 10CC   Final   Culture  Setup Time     Final   Value: 04/20/2014  17:10     Performed at [facility]   Culture     Final   Value:        BLOOD CULTURE RECEIVED NO GROWTH TO DATE CULTURE WILL BE HELD FOR 5 DAYS BEFORE ISSUING A FINAL NEGATIVE REPORT     Performed at [facility]   Report Status PENDING   Incomplete  CULTURE, BLOOD (ROUTINE X 2)     Status: None   Collection Time    04/20/14  9:59 AM      Result Value Ref Range Status   Specimen Description BLOOD RIGHT HAND   Final   Special Requests BOTTLES DRAWN AEROBIC ONLY 10CC   Final   Culture  Setup Time     Final   Value: 04/20/2014 17:07     Performed at [facility]   Culture     Final   Value:        BLOOD CULTURE RECEIVED NO GROWTH TO DATE CULTURE WILL BE HELD FOR 5 DAYS BEFORE ISSUING A FINAL NEGATIVE REPORT     Performed at [facility]   Report Status PENDING   Incomplete    Anti-infectives   Start     Dose/Rate Route Frequency Ordered Stop   04/22/14 2200  vancomycin (VANCOCIN) IVPB 1000 mg/200 mL premix     1,000 mg 200 mL/hr over 60 Minutes Intravenous Every 48 hours 04/20/14 2145     04/20/14 2215  vancomycin (VANCOCIN) 1,250 mg in sodium  chloride 0.9 % 250 mL IVPB     1,250 mg 166.7 mL/hr over 90 Minutes Intravenous  Once 04/20/14 2145 04/21/14 0003   04/20/14 1300  meropenem (MERREM) 500 mg in sodium chloride 0.9 % 50 mL IVPB     500 mg 100 mL/hr over 30 Minutes Intravenous Every 12 hours 04/20/14 1154     04/20/14 0100  clindamycin (CLEOCIN) IVPB 600 mg  Status:  Discontinued     600 mg 100 mL/hr over 30 Minutes Intravenous 4 times per day 04/19/14 2354 04/20/14 1152   04/19/14 1800  clindamycin (CLEOCIN) IVPB 600 mg     600 mg 100 mL/hr over 30 Minutes Intravenous  Once 04/19/14 1747 04/19/14 1945      Assessment: 63 year old female on Day #5 of antibiotics for sialoadenitis/parotitis.  She is currently on Vancomycin and Meropenem.  She has acute on chronic renal failure and her SCr is improving.  She will require Vancomycin regimen  adjustment.  Note meropenem is in shortage and is being reserved for patients that have meningitis.  Goal of Therapy:  Vancomycin trough level 15-20 mcg/ml  Plan:  Change Vancomycin to 75m IV q24h Monitor renal function closely Check Vancomycin trough at steady state Spoke with [facility]PT[facility] re: meropenem shortage.  She is not ready to narrow ABX so will change to Imipenem.  If our imipenem supply is exhausted will convert to Doripenem. Imipenem 2527mIV q8h  MiLegrand ComoPharm.D., BCPS, AAHIVP Clinical Pharmacist Phone: [phone] [phone]/19/2015, 10:40 AM

## 2014-04-23 NOTE — Progress Notes (Addendum)
Speech Language Pathology Treatment: Dysphagia  Patient Details Name: [name] [name] MRN: [id number] DOB: 26-Dec-1950 Today's Date: 04/23/2014 Time: 5041-3643 SLP Time Calculation (min): 12 min  Assessment / Plan / Recommendation Clinical Impression  Pt. Report edema has decreased and swallowing ability improved.  Vocal quality with decreased hoarseness compared with previous SLP documentation.  Inconsistent, immediate minimal throat clear following thin liquid, not concerning for aspiration/ perhaps penetration which is sensed.  Consumed a Dys 2 texture for potential upgrade with mildly prolonged mastication.  Required mild verbal cues to direct liquids to right side oral cavity.  Recommend upgrade to Dys 2 (order written), continue thin, no straw, attempt pills whole with applesauce.  Will follow for safety and progression of texture po's.   HPI HPI: Patient presents with history of GERD, left facial swelling, tenderness and fever. She reports recent symptoms of upper respiratory tract infection, no prior salivary gland abnormality. She reports a three-day history of progressive swelling in the left cheek and perimandibular region, seen in the emergency department with complaints of pain, difficulty swallowing and fever. CT scan shows diffuse swelling and inflammatory changes involving the left parotid and submandibular glands, no evidence of stone, obstruction or abscess, left neck adenopathy. Per ENT, patient with acute left parotitis which may be related to recent upper respiratory tract infection and dehydration.   Pertinent Vitals Pain Assessment: No/denies pain  SLP Plan  Continue with current plan of care    Recommendations Diet recommendations: Dysphagia 2 (fine chop);Thin liquid Liquids provided via: Cup;No straw Medication Administration: Whole meds with puree Supervision: Patient able to self feed;Intermittent supervision to cue for compensatory strategies Compensations: Slow  rate;Small sips/bites;Follow solids with liquid Postural Changes and/or Swallow Maneuvers: Seated upright 90 degrees              Oral Care Recommendations: Oral care BID Follow up Recommendations:  (TBD) Plan: Continue with current plan of care    GO     [name] [name] 04/23/2014, 9:52 AM  [name] [name] [name] [name] [name] [name].Ed [employer] [phone]

## 2014-04-23 NOTE — Progress Notes (Signed)
Family Medicine Teaching Service Daily Progress Note Intern Pager: [phone]  Patient name: [name] [name] Medical record number: [id number] Date of birth: 1950-07-15 Age: 63 y.o. Gender: female  Primary Care Provider: Zigmund Gottron, MD Consultants: ENT Code Status: Full code  Pt Overview and Major Events to Date:  10/15 - admit to [facility] for L neck/facial swelling  Assessment and Plan: [name] [name] is a 63 y.o. female presenting with sepsis 2/2 L sided sialoadenitis. PMH is significant for Crohns disease s/p colostomy in 1997, CKD stage III (baseline CR 1.7-2.1), HTN, HLD, CAD, h/o viral myocarditis, anxiety.   #Sepsis 2/2 L sided Sialoadenitis/Parotiditis: CT with Left-sided sialoadenitis, involving L submandibular & inferior parotid glands, no evidence of abscess. Febrile Tmax 102.5, leukocytosis resolved, now uptrending again 20.2 > ...>10.5>12.5. No respiratory compromise. ENT recommends continued IV antibiotics with eventual transition to PO with discharge when stable. Patient has been afebrile O/N. - switched to meropenem and vancomycin, will continue - [place] with no abscess - continue IV Fentanyl 47m q2h prn for pain control  - Tylenol PO or PR prn fever  - Zofran PO or IV prn nausea   - Closely monitor resp status given neck swelling with acute onset and worsening x 3 days  - f/u BCx (10/16) (drawn after initiation of abx) - NGTD - [place] O2 @2L  since low pO2 (59) on ABG  #Crohns disease: Reported increased liquid ostomy output x1 day on admission, now resolved. No abdominal pain or blood in ostomy output.  - Continue home Mesalamine  - Will hold home Magnesium, unclear why patient is taking this. Avoid laxative effect currently  - Consult to wound/ostomy for colostomy care   #AKI on CKD Stage III - Resolved. Cr 2.67 on admission, now below baseline at 1.47 (baseline 1.7-2.1). Was likely prerenal given decreased PO intake and increased ostomy output.  - Avoid all nephrotoxic  agents  - Continue to monitor   #Anion gap metabolic acidosis - Resolved. AG 13, bicarb 30. likely related to AKI on CKD. Bicarb has been low in the past as well. - Lactic acid wnl (0.8)  #Hypotension with h/o HTN - Improving. BP 125/63 this AM.   - Holding home Metoprolol for hypotension  - Continue to monitor BP - Concern for adrenal insufficieny 2/2 h/o prednisone use.  AM cortisol 24.1.  Continue prednisone 444mtoday, with plan to taper off over next week.  # Hyponatremia - Resolved. Likely hypovolemic hyponatremia related to decreased PO intake and increased ostomy output.   # Hypokalemia: Resolved. s/p 40 + 2- mEq PO repletion 10/17.  K 3.7 today.  # Normocytic Anemia: Still downtrending. Hgb 12.5>...> 8.5 > 8.2.  Baseline (10-11).  Other blood lines increasing today. No blood from ostomy. No hematemesis.  - Continue to monitor - Consider iron studies  #Anxiety: S/p 15 sec shaking episode not concerning for seizure, likely anxiety related on 10/18.  Patient very anxious and often tearful, resolved with home prn ativan. - Continue home celexa and ativan 0.4m63mO BID prn  - SW consult placed for family strife  #CAD, HLD, h/o viral myocarditis: No CP or SOB.  - Continue home plavix and Zocor   FEN/GI: dysphagia 1diet, KVO Prophylaxis: subq heparin   Disposition: Pending improvement with no fevers and transition to PO medications  Subjective:  Patient reports that pain is well controlled.  She gets anxious when the nurses take her blood pressure, because she feels a very brief tingling in her hand when cuff is inflated.  Objective: Temp:  [98.6 F (37 C)-98.9 F (37.2 C)] 98.7 F (37.1 C) (10/19 0700) Pulse Rate:  [83-111] 83 (10/19 0700) Resp:  [18-20] 18 (10/19 0700) BP: (118-140)/(63-85) 125/63 mmHg (10/19 0700) SpO2:  [95 %-100 %] 95 % (10/19 0700) Physical Exam: General: Pleasant AAF laying in hospital bed in NAD.  HEENT: Large mass at left mandible that is very  tender and warm without erythema. Not fluctuant. Swelling in neck notably decreased Cardiovascular: RRR, no m/r/g.  Respiratory: CTAB, no w/r/c. Good air movement. Normal WOB Abdomen: Soft, NDNT, ostomy with liquid output and air filled bag. No erythema around ostomy. No frank blood in ostomy  Extremities: WWP, no edema/cyanosis  Neuro: No focal deficits. Moves all extremities.   Laboratory:  Recent Labs Lab 04/21/14 1440 04/22/14 0500 04/23/14 0550  WBC 11.3* 10.5 12.5*  HGB 8.0* 8.5* 8.2*  HCT 23.5* 25.2* 24.4*  PLT 140* 191 204    Recent Labs Lab 04/21/14 2223 04/22/14 0500 04/23/14 0550  NA 138 141 145  K 4.2 4.0 3.7  CL 104 103 102  CO2 20 25 30   BUN 30* 25* 21  CREATININE 2.13* 1.88* 1.47*  CALCIUM 7.6* 7.8* 7.7*  GLUCOSE 145* 144* 101*    Lactic acid 0.8  Imaging/Diagnostic Tests: CT Maxillofacial (10/15): Left-sided sialoadenitis, involving the left submandibular gland and inferior provided gland. Inflammatory changes result in thickening of the left platysma in the overlying soft tissues, with asymmetry  of the facial contour. No sialolith identified. No focal fluid collection to suggest abscess.  The patient has endodontal disease, though there are no periapical  lucencies to suggest abscess tooth.  US Soft tissue head/neck (10/18): Previously described left parotid and submandibular sialoadenitis with associated reactive adenopathy. No abscess seen.    [name] Paganini, MD 04/23/2014, 9:48 AM PGY-1, [place] Intern pager: [phone], text pages welcome

## 2014-04-24 LAB — BASIC METABOLIC PANEL WITH GFR
Anion gap: 13 (ref 5–15)
BUN: 22 mg/dL (ref 6–23)
CO2: 26 meq/L (ref 19–32)
Calcium: 7.9 mg/dL — ABNORMAL LOW (ref 8.4–10.5)
Chloride: 100 meq/L (ref 96–112)
Creatinine, Ser: 1.38 mg/dL — ABNORMAL HIGH (ref 0.50–1.10)
GFR calc Af Amer: 47 mL/min — ABNORMAL LOW
GFR calc non Af Amer: 41 mL/min — ABNORMAL LOW
Glucose, Bld: 119 mg/dL — ABNORMAL HIGH (ref 70–99)
Potassium: 3.5 meq/L — ABNORMAL LOW (ref 3.7–5.3)
Sodium: 139 meq/L (ref 137–147)

## 2014-04-24 LAB — CBC
HCT: 26.8 % — ABNORMAL LOW (ref 36.0–46.0)
HEMOGLOBIN: 9 g/dL — AB (ref 12.0–15.0)
MCH: 31.8 pg (ref 26.0–34.0)
MCHC: 33.6 g/dL (ref 30.0–36.0)
MCV: 94.7 fL (ref 78.0–100.0)
Platelets: 205 10*3/uL (ref 150–400)
RBC: 2.83 MIL/uL — ABNORMAL LOW (ref 3.87–5.11)
RDW: 14.2 % (ref 11.5–15.5)
WBC: 9.4 10*3/uL (ref 4.0–10.5)

## 2014-04-24 MED ORDER — CLINDAMYCIN HCL 300 MG PO CAPS
450.0000 mg | ORAL_CAPSULE | Freq: Three times a day (TID) | ORAL | Status: DC
  Administered 2014-04-24 – 2014-04-25 (×3): 450 mg via ORAL
  Filled 2014-04-24 (×5): qty 1

## 2014-04-24 MED ORDER — PREDNISONE 20 MG PO TABS
20.0000 mg | ORAL_TABLET | Freq: Every day | ORAL | Status: DC
  Administered 2014-04-25: 20 mg via ORAL
  Filled 2014-04-24 (×2): qty 1

## 2014-04-24 MED ORDER — CIPROFLOXACIN HCL 750 MG PO TABS
750.0000 mg | ORAL_TABLET | Freq: Two times a day (BID) | ORAL | Status: DC
  Administered 2014-04-24 – 2014-04-25 (×2): 750 mg via ORAL
  Filled 2014-04-24 (×4): qty 1

## 2014-04-24 MED ORDER — LORAZEPAM 0.5 MG PO TABS
0.5000 mg | ORAL_TABLET | Freq: Three times a day (TID) | ORAL | Status: DC | PRN
  Administered 2014-04-24: 0.5 mg via ORAL
  Filled 2014-04-24: qty 1

## 2014-04-24 NOTE — Progress Notes (Signed)
I discussed with [place]ig[place].  I agree with their plans documented in their progress note.

## 2014-04-24 NOTE — Progress Notes (Signed)
CARE MANAGEMENT NOTE 04/24/2014  Patient:  [name] [name],[name] [name]   Account Number:  [id number]  Date Initiated:  04/20/2014  Documentation initiated by:  Lizabeth Leyden  Subjective/Objective Assessment:   admitted wiith infection, left sided sialoadenitis     Action/Plan:   progression of care and discharge planning   Anticipated DC Date:  04/22/2014   Anticipated DC Plan:  HOME/SELF CARE         Choice offered to / List presented to:             Status of service:  In process, will continue to follow Medicare Important Message given?  YES (If response is "NO", the following Medicare IM given date fields will be blank) Date Medicare IM given:  04/23/2014 Medicare IM given by:  Lizabeth Leyden Date Additional Medicare IM given:   Additional Medicare IM given by:    Discharge Disposition:    Per UR Regulation:    If discussed at Long Length of Stay Meetings, dates discussed:    Comments:

## 2014-04-24 NOTE — Evaluation (Signed)
Physical Therapy Evaluation Patient Details Name: [name] [name] MRN: [id number] DOB: 06-13-1951 Today's Date: 04/24/2014   History of Present Illness  patient is a 63 yo female admitted wiith infection, left sided sialoadenitis   Clinical Impression  Patient demonstrates deficits in functional mobility as indicated below. Will need continued skilled PT to address deficits and maximize function. Will continue to see as indicated and progress as tolerated.     Follow Up Recommendations Home health PT;Supervision/Assistance - 24 hour    Equipment Recommendations  None recommended by PT    Recommendations for Other Services       Precautions / Restrictions Precautions Precautions: Fall Restrictions Weight Bearing Restrictions: No      Mobility  Bed Mobility               General bed mobility comments: patient being assisted OOB to chair with nsg upon entering the room  Transfers Overall transfer level: Needs assistance Equipment used: Rolling walker (2 wheeled) Transfers: Sit to/from Stand Sit to Stand: Supervision         General transfer comment: performed x3 with VCs for hand placement and positioning at EOB coming to upright  Ambulation/Gait Ambulation/Gait assistance: Supervision;Min guard Ambulation Distance (Feet): 140 Feet Assistive device: Rolling walker (2 wheeled) Gait Pattern/deviations: Step-through pattern;Decreased stride length;Drifts right/left;Narrow base of support Gait velocity: decreased Gait velocity interpretation: Below normal speed for age/gender General Gait Details: instability noted with ambulation, cues for attention to task. One seated rest break during ambulation  Stairs            Wheelchair Mobility    Modified Rankin (Stroke Patients Only)       Balance Overall balance assessment: Needs assistance Sitting-balance support: Feet supported Sitting balance-Leahy Scale: Good     Standing balance support: Bilateral  upper extremity supported;During functional activity Standing balance-Leahy Scale: Fair                               Pertinent Vitals/Pain Pain Assessment: No/denies pain    Home Living Family/patient expects to be discharged to:: Private residence Living Arrangements: Alone Available Help at Discharge: Family (granddaughter) Type of Home: House Home Access: Stairs to enter Entrance Stairs-Rails: Psychiatric nurse of Steps: 4 Home Layout: One level Home Equipment: Environmental consultant - 2 wheels      Prior Function Level of Independence: Independent;Independent with assistive device(s)         Comments: has a rw at home     Hand Dominance   Dominant Hand: Right    Extremity/Trunk Assessment   Upper Extremity Assessment: Generalized weakness           Lower Extremity Assessment: Generalized weakness      Cervical / Trunk Assessment: Kyphotic  Communication   Communication: HOH  Cognition Arousal/Alertness: Awake/alert Behavior During Therapy: WFL for tasks assessed/performed Overall Cognitive Status: No family/caregiver present to determine baseline cognitive functioning                      General Comments General comments (skin integrity, edema, etc.): spoke with patient at length regarding need for initial 24/7 supervision upon discahrge, states that her granddaughter is able to help her possibly    Exercises General Exercises - Lower Extremity Ankle Circles/Pumps: AROM;Both (continuously performed at start of session)      Assessment/Plan    PT Assessment Patient needs continued PT services  PT Diagnosis Difficulty walking;Abnormality of gait;Generalized  weakness   PT Problem List Decreased strength;Decreased range of motion;Decreased activity tolerance;Decreased balance;Decreased mobility  PT Treatment Interventions DME instruction;Gait training;Stair training;Functional mobility training;Therapeutic  activities;Therapeutic exercise;Balance training;Patient/family education   PT Goals (Current goals can be found in the Care Plan section) Acute Rehab PT Goals Patient Stated Goal: to go home as soon as possible PT Goal Formulation: With patient Time For Goal Achievement: 05/08/14 Potential to Achieve Goals: Good    Frequency Min 3X/week   Barriers to discharge Decreased caregiver support      Co-evaluation               End of Session Equipment Utilized During Treatment: Gait belt Activity Tolerance: Patient tolerated treatment well Patient left: in chair;with call bell/phone within reach Nurse Communication: Mobility status         Time: 2202-6691 PT Time Calculation (min): 25 min   Charges:   PT Evaluation $Initial PT Evaluation Tier I: 1 Procedure PT Treatments $Gait Training: 8-22 mins $Self Care/Home Management: 8-22   PT G CodesDuncan Dull 04/24/2014, 3:56 PM Alben Deeds, [place] DPT  [phone]

## 2014-04-24 NOTE — Progress Notes (Signed)
Family Medicine Teaching Service Daily Progress Note Intern Pager: [phone]  Patient name: [name] [name] Medical record number: [id number] Date of birth: October 28, 1950 Age: 63 y.o. Gender: female  Primary Care Provider: Zigmund Gottron, MD Consultants: ENT Code Status: Full code  Pt Overview and Major Events to Date:  10/15 - admit to [place] for L neck/facial swelling 10/18 - US shows no abscess  Assessment and Plan: [name] [name] is a 63 y.o. female presenting with sepsis 2/2 L sided sialoadenitis. PMH is significant for Crohns disease s/p colostomy in 1997, CKD stage III (baseline CR 1.7-2.1), HTN, HLD, CAD, h/o viral myocarditis, anxiety.   #Sepsis 2/2 L sided Sialoadenitis/Parotiditis: CT with Left-sided sialoadenitis, involving L submandibular & inferior parotid glands, no evidence of abscess. [place] with no abscess.  Afebrile x3d, leukocytosis resolved, now uptrending again 20.2 > ...>10.5>12.5. No respiratory compromise.  - ENT recommends continued IV antibiotics with eventual transition to PO with discharge when stable. Patient has been afebrile O/N. - Continue Imipenem and vancomycin, per pharm - will touch base with ENT about when to switch to PO abx. - continue IV Fentanyl 825m q2h prn for pain control  - Tylenol PO or PR prn fever  - Zofran PO or IV prn nausea   - Closely monitor resp status - [place] O2 @2L  since low pO2 (59) on ABG - f/u BCx (10/16) (drawn after initiation of abx) - NGTD - AM CBC pending  #Crohns disease: Reported increased liquid ostomy output x1 day on admission, now resolved. No abdominal pain or blood in ostomy output.  - Continue home Mesalamine  - Will hold home Magnesium, unclear why patient is taking this. Avoid laxative effect currently  - Consult to wound/ostomy for colostomy care   #AKI on CKD Stage III - Resolved. Cr 2.67 on admission, now below baseline at 1.47 (baseline 1.7-2.1). Was likely prerenal given decreased PO intake and increased ostomy  output.  - Avoid all nephrotoxic agents  - Continue to monitor - AM BMET pending  #Anion gap metabolic acidosis - Resolved. AG 13, bicarb 30. likely related to AKI on CKD. Bicarb has been low in the past as well. - Lactic acid wnl (0.8)  #Hypotension with h/o HTN - Resolved. BP 121-156/55-84 O/N.   - Holding home Metoprolol for hypotension. Consider restarting if BPs continue to be elevated  - Continue to monitor BP - Concern for adrenal insufficiency 2/2 h/o prednisone use.  AM cortisol 24.1.   - Decrease prednisone to 2325mfor tomorrow morning's dose, with plan to taper off over next week. - Could be contributing to anxiety.  # Hyponatremia - Resolved. Likely hypovolemic hyponatremia related to decreased PO intake and increased ostomy output.   # Hypokalemia: Resolved. s/p 40 + 2- mEq PO repletion 10/17.  K 3.7 today.  # Normocytic Anemia: Hgb 12.5>...> 8.5 > 8.2.  Baseline (10-11).  Other blood lines increasing today. No blood from ostomy. No hematemesis.  - Continue to monitor - f/u hemacult  - AM CBC pending  #Anxiety: S/p 15 sec shaking episode not concerning for seizure, likely anxiety related on 10/18.  Patient continues to be very anxious. - Continue home celexa  - Consider increasing ativan 0.25m71mO to TID prn dosing - SW consult placed for family strife  #CAD, HLD, h/o viral myocarditis: No CP or SOB.  - Continue home plavix and Zocor   FEN/GI: dysphagia 2 diet, KVO Prophylaxis: subq heparin  Disposition: Pending improvement with no fevers and transition to PO medications. PT/OT  evals  Subjective:  Patient reports that pain is well controlled.  She gets anxious when the IV pump beeps and because she cannot get out of bed by her self.  She wonders when she can go home, where she is less anxious.   Objective: Temp:  [98 F (36.7 C)-99 F (37.2 C)] 98.4 F (36.9 C) (10/20 0641) Pulse Rate:  [78-94] 78 (10/20 0641) Resp:  [17-20] 17 (10/20 0641) BP:  (121-156)/(55-84) 156/84 mmHg (10/20 0641) SpO2:  [96 %-100 %] 100 % (10/20 0641) Physical Exam: General: Pleasant AAF laying in hospital bed in NAD. Appears anxious. HEENT: Large mass (decreasing in size) at left mandible that is much less tender and warm without erythema. Not fluctuant. Swelling in neck notably decreased from admission. Cardiovascular: RRR, no m/r/g.  Respiratory: CTAB, no w/r/c. Good air movement. Normal WOB Abdomen: Soft, NDNT, ostomy with good output. No erythema around ostomy. No frank blood in ostomy  Extremities: WWP, 1+ edema in b/l feet. No calf tenderness  Neuro: No focal deficits. Moves all extremities.   Laboratory:  Recent Labs Lab 04/21/14 1440 04/22/14 0500 04/23/14 0550  WBC 11.3* 10.5 12.5*  HGB 8.0* 8.5* 8.2*  HCT 23.5* 25.2* 24.4*  PLT 140* 191 204    Recent Labs Lab 04/21/14 2223 04/22/14 0500 04/23/14 0550  NA 138 141 145  K 4.2 4.0 3.7  CL 104 103 102  CO2 20 25 30   BUN 30* 25* 21  CREATININE 2.13* 1.88* 1.47*  CALCIUM 7.6* 7.8* 7.7*  GLUCOSE 145* 144* 101*    Lactic acid 0.8  Imaging/Diagnostic Tests: CT Maxillofacial (10/15): Left-sided sialoadenitis, involving the left submandibular gland and inferior provided gland. Inflammatory changes result in thickening of the left platysma in the overlying soft tissues, with asymmetry  of the facial contour. No sialolith identified. No focal fluid collection to suggest abscess.  The patient has endodontal disease, though there are no periapical  lucencies to suggest abscess tooth.  US Soft tissue head/neck (10/18): Previously described left parotid and submandibular sialoadenitis with associated reactive adenopathy. No abscess seen.    Lavon Paganini, MD 04/24/2014, 9:16 AM PGY-1, [place] Intern pager: [phone], text pages welcome

## 2014-04-24 NOTE — Progress Notes (Signed)
Speech Language Pathology Treatment: Dysphagia  Patient Details Name: [name] [name] MRN: [id number] DOB: 1951-01-29 Today's Date: 04/24/2014 Time: 7255-0016 SLP Time Calculation (min): 9 min  Assessment / Plan / Recommendation Clinical Impression  Pt. Observed with regular texture for possible upgrade.  Overall status improving; less left parotid edema; pt. Feels as she is improving.  No difficulty masticating solid texture; no s/s aspiration.  Recommend upgrade regular and continue thin liquids, pills whole with liquids.  No further ST needed.   HPI HPI: Patient presents with history of GERD, left facial swelling, tenderness and fever. She reports recent symptoms of upper respiratory tract infection, no prior salivary gland abnormality. She reports a three-day history of progressive swelling in the left cheek and perimandibular region, seen in the emergency department with complaints of pain, difficulty swallowing and fever. CT scan shows diffuse swelling and inflammatory changes involving the left parotid and submandibular glands, no evidence of stone, obstruction or abscess, left neck adenopathy. Per ENT, patient with acute left parotitis which may be related to recent upper respiratory tract infection and dehydration.   Pertinent Vitals Pain Assessment: No/denies pain  SLP Plan  All goals met;Discharge SLP treatment due to (comment)    Recommendations Diet recommendations: Regular;Thin liquid Liquids provided via: Cup;No straw Medication Administration: Whole meds with liquid Supervision: Patient able to self feed Compensations: Slow rate;Small sips/bites;Follow solids with liquid Postural Changes and/or Swallow Maneuvers: Out of bed for meals;Seated upright 90 degrees              Oral Care Recommendations: Oral care BID Follow up Recommendations: None Plan: All goals met;Discharge SLP treatment due to (comment)    GO     Houston Siren 04/24/2014, 2:36 PM  Orbie Pyo  Colvin Caroli.[employer] [phone]

## 2014-04-25 LAB — BASIC METABOLIC PANEL
Anion gap: 12 (ref 5–15)
BUN: 28 mg/dL — ABNORMAL HIGH (ref 6–23)
CO2: 26 mEq/L (ref 19–32)
Calcium: 8.1 mg/dL — ABNORMAL LOW (ref 8.4–10.5)
Chloride: 104 mEq/L (ref 96–112)
Creatinine, Ser: 1.55 mg/dL — ABNORMAL HIGH (ref 0.50–1.10)
GFR calc Af Amer: 41 mL/min — ABNORMAL LOW (ref >90)
GFR, EST NON AFRICAN AMERICAN: 35 mL/min — AB (ref >90)
GLUCOSE: 84 mg/dL (ref 70–99)
POTASSIUM: 4 meq/L (ref 3.7–5.3)
SODIUM: 142 meq/L (ref 137–147)

## 2014-04-25 LAB — CBC
HCT: 26.8 % — ABNORMAL LOW (ref 36.0–46.0)
Hemoglobin: 8.8 g/dL — ABNORMAL LOW (ref 12.0–15.0)
MCH: 31.1 pg (ref 26.0–34.0)
MCHC: 32.8 g/dL (ref 30.0–36.0)
MCV: 94.7 fL (ref 78.0–100.0)
Platelets: 239 10*3/uL (ref 150–400)
RBC: 2.83 MIL/uL — ABNORMAL LOW (ref 3.87–5.11)
RDW: 14.1 % (ref 11.5–15.5)
WBC: 12.3 10*3/uL — ABNORMAL HIGH (ref 4.0–10.5)

## 2014-04-25 MED ORDER — ACETAMINOPHEN 325 MG PO TABS
650.0000 mg | ORAL_TABLET | Freq: Four times a day (QID) | ORAL | Status: DC
Start: 2014-04-25 — End: 2014-04-25

## 2014-04-25 MED ORDER — PREDNISONE 10 MG PO TABS: 10.0000 mg | ORAL_TABLET | Freq: Every day | ORAL | Status: AC

## 2014-04-25 MED ORDER — CIPROFLOXACIN HCL 750 MG PO TABS: 750.0000 mg | ORAL_TABLET | Freq: Two times a day (BID) | ORAL | Status: DC

## 2014-04-25 MED ORDER — CLINDAMYCIN HCL 150 MG PO CAPS: 450.0000 mg | ORAL_CAPSULE | Freq: Three times a day (TID) | ORAL | Status: DC

## 2014-04-25 NOTE — Evaluation (Signed)
Occupational Therapy Evaluation Patient Details Name: [name] [name] MRN: [id number] DOB: [birth year] Today's Date: 04/25/2014    History of Present Illness patient is a 63 yo female admitted wiith infection, left sided sialoadenitis    Clinical Impression   Pt was independent prior to admission and drives.  She is performing ADL at a modified independent level with RW.  Reinforced walker use at home initially.  Recommended pt consider tub seat if standing showering proves too tiring.  No further OT needs.    Follow Up Recommendations  No OT follow up    Equipment Recommendations  None recommended by OT    Recommendations for Other Services       Precautions / Restrictions Precautions Precautions: Fall Precaution Comments: colostomy bag Restrictions Weight Bearing Restrictions: No      Mobility Bed Mobility Overal bed mobility: Modified Independent                Transfers Overall transfer level: Modified independent Equipment used: Rolling walker (2 wheeled) Transfers: Sit to/from Stand Sit to Stand: Modified independent (Device/Increase time)              Balance Overall balance assessment: Needs assistance         Standing balance support: No upper extremity supported Standing balance-Leahy Scale: Fair                              ADL Overall ADL's : Modified independent                                             Vision                     Perception     Praxis      Pertinent Vitals/Pain Pain Assessment: No/denies pain     Hand Dominance Right   Extremity/Trunk Assessment Upper Extremity Assessment Upper Extremity Assessment: Overall WFL for tasks assessed   Lower Extremity Assessment Lower Extremity Assessment: Overall WFL for tasks assessed       Communication Communication Communication: HOH   Cognition Arousal/Alertness: Awake/alert Behavior During Therapy: WFL for tasks  assessed/performed Overall Cognitive Status: Within Functional Limits for tasks assessed                     General Comments       Exercises       Shoulder Instructions      Home Living Family/patient expects to be discharged to:: Private residence Living Arrangements: Alone Available Help at Discharge: Family (granddaughter and 38 month old great grandson) Type of Home: House Home Access: Stairs to enter Technical brewer of Steps: 4 Entrance Stairs-Rails: Right;Left Home Layout: One level     Bathroom Shower/Tub: Teacher, early years/pre: Standard     Home Equipment: Environmental consultant - 2 wheels;Grab bars - tub/shower          Prior Functioning/Environment Level of Independence: Independent;Independent with assistive device(s)        Comments: has a rw at home    OT Diagnosis:     OT Problem List:     OT Treatment/Interventions:      OT Goals(Current goals can be found in the care plan section) Acute Rehab OT Goals Patient Stated Goal: to go home as soon as possible  OT Frequency:     Barriers to D/C:            Co-evaluation              End of Session Equipment Utilized During Treatment: Rolling walker  Activity Tolerance: Patient tolerated treatment well Patient left: in bed;with call bell/phone within reach   Time: 4469-5072 OT Time Calculation (min): 27 min Charges:  OT General Charges $OT Visit: 1 Procedure OT Evaluation $Initial OT Evaluation Tier I: 1 Procedure OT Treatments $Self Care/Home Management : 8-22 mins G-Codes:    [name] [name] 04/25/2014, 12:00 PM [phone]

## 2014-04-25 NOTE — Progress Notes (Signed)
Discharge documentation reviewed with pt; allowing time for questions. Pt verbalized understanding. IV removed without issue. Pt leaving unit via wheelchair accompanied by Probation officer.

## 2014-04-25 NOTE — Progress Notes (Signed)
Physical Therapy Treatment Patient Details Name: [name] [name] MRN: [id number] DOB: 09/22/1950 Today's Date: 04/25/2014    History of Present Illness patient is a 63 yo female admitted wiith infection, left sided sialoadenitis     PT Comments    Pt is making excellent progress with mobility. Pt reports she lives alone and is determined to return home. Pt does present with some gait instability when not using a RW. Pt reported she probably will not use the RW at home. Pt is Mod. Indep. with the RW. Reinforced to the pt she is at a fall risk if she does not use the RW. Pt acknowledged my recommendation. Pt agreeable to HHPT to progress gait for improved independence.   Follow Up Recommendations  Home health PT;Supervision - Intermittent     Equipment Recommendations  None recommended by PT    Recommendations for Other Services       Precautions / Restrictions Precautions Precautions: Fall Precaution Comments: colostomy bag Restrictions Weight Bearing Restrictions: No    Mobility  Bed Mobility Overal bed mobility: Modified Independent                Transfers Overall transfer level: Modified independent   Transfers: Sit to/from Stand Sit to Stand: Modified independent (Device/Increase time)            Ambulation/Gait Ambulation/Gait assistance: Supervision Ambulation Distance (Feet): 200 Feet Assistive device: None Gait Pattern/deviations: Step-through pattern;Decreased stride length Gait velocity: decreased Gait velocity interpretation: Below normal speed for age/gender General Gait Details: Pt requires supervision without RW due to bilateral edema in her feet causing decreased ROM and discomfort with gait affecting her balance. Pt is mod. Indep. with RW 50 ft.   Stairs            Wheelchair Mobility    Modified Rankin (Stroke Patients Only)       Balance Overall balance assessment: Needs assistance         Standing balance support: No  upper extremity supported Standing balance-Leahy Scale: Fair                      Cognition Arousal/Alertness: Awake/alert Behavior During Therapy: WFL for tasks assessed/performed Overall Cognitive Status: Within Functional Limits for tasks assessed                      Exercises      General Comments        Pertinent Vitals/Pain Pain Assessment: No/denies pain    Home Living                      Prior Function            PT Goals (current goals can now be found in the care plan section) Progress towards PT goals: Progressing toward goals    Frequency  Min 3X/week    PT Plan Current plan remains appropriate    Co-evaluation             End of Session Equipment Utilized During Treatment: Gait belt Activity Tolerance: Patient tolerated treatment well Patient left: in chair;with call bell/phone within reach     Time: 0743-0806 PT Time Calculation (min): 23 min  Charges:  $Gait Training: 23-37 mins                    G Codes:      [name] [name] 04/25/2014, 8:50 AM

## 2014-04-25 NOTE — Discharge Instructions (Signed)
You were admitted for an infection in your salivary glands.  Please finish the course of antibiotics at home.  If you start to have fevers again or the swelling increases, seek medical attention.  Remember to use the salivary precautions to help make you feel better (massage the gland from back to front, suck on a lemon drop or chew gum, drink plenty of fluids, use a warm compress as needed).  You can take tylenol as needed for pain control.  Finish 3 more days of the prednisone and then stop taking it.  Parotitis Parotitis is soreness and inflammation of one or both parotid glands. The parotid glands produce saliva. They are located on each side of the face, below and in front of the earlobes. The saliva produced comes out of tiny openings (ducts) inside the cheeks. In most cases, parotitis goes away over time or with treatment. If your parotitis is caused by certain long-term (chronic) diseases, it may come back again.  CAUSES  Parotitis can be caused by:  Viral infections. Mumps is one viral infection that can cause parotitis.  Bacterial infections.  Blockage of the salivary ducts due to a salivary stone.  Narrowing of the salivary ducts.  Swelling of the salivary ducts.  Dehydration.  Autoimmune conditions, such as sarcoidosis or Sjogren syndrome.  Air from activities such as scuba diving, glass blowing, or playing an instrument (rare).  Human immunodeficiency virus (HIV) or acquired immunodeficiency syndrome (AIDS).  Tuberculosis. SIGNS AND SYMPTOMS   The ears may appear to be pushed up and out from their normal position.  Redness (erythema) of the skin over the parotid glands.  Pain and tenderness over the parotid glands.  Swelling in the parotid gland area.  Yellowish-white fluid (pus) coming from the ducts inside the cheeks.  Dry mouth.  Bad taste in the mouth. DIAGNOSIS  Your health care provider may determine that you have parotitis based on your symptoms and a  physical exam. A sample of fluid may also be taken from the parotid gland and tested to find the cause of your infection. X-rays or computed tomography (CT) scans may be taken if your health care provider thinks you might have a salivary stone blocking your salivary duct. TREATMENT  Treatment varies depending upon the cause of your parotitis. If your parotitis is caused by mumps, no treatment is needed. The condition will go away on its own after 7 to 10 days. In other cases, treatment may include:  Antibiotic medicine if your infection was caused by bacteria.  Pain medicines.  Gland massage.  Eating sour candy to increase your saliva production.  Removal of salivary stones. Your health care provider may flush stones out with fluids or remove them with tweezers.  Surgery to remove the parotid glands. HOME CARE INSTRUCTIONS   If you were prescribed an antibiotic medicine, finish it all even if you start to feel better.  Put warm compresses on the sore area.  Take medicines only as directed by your health care provider.  Drink enough fluids to keep your urine clear or pale yellow. SEEK IMMEDIATE MEDICAL CARE IF:   You have increasing pain or swelling that is not controlled with medicine.  You have a fever. MAKE SURE YOU:  Understand these instructions.  Will watch your condition.  Will get help right away if you are not doing well or get worse. Document Released: 12/12/2001 Document Revised: 11/06/2013 Document Reviewed: 05/18/2011 [facility] Patient Information 2015 [place], [place]. This information is not intended to  replace advice given to you by your health care provider. Make sure you discuss any questions you have with your health care provider.

## 2014-04-25 NOTE — Progress Notes (Signed)
CARE MANAGEMENT NOTE 04/25/2014  Patient:  [name],[name]   Account Number:  [id number]  Date Initiated:  04/20/2014  Documentation initiated by:  [name] [name]  Subjective/Objective Assessment:   admitted wiith infection, left sided sialoadenitis     Action/Plan:   progression of care and discharge planning   Anticipated DC Date:  04/22/2014   Anticipated DC Plan:  [place]  CM consult      [facility] Choice  HOME HEALTH   Choice offered to / List presented to:  C-1 Patient   DME arranged  [facility]      DME agency  [facility] arranged  HH-1 RN  [place].   Status of service:  Completed, signed off Medicare Important Message given?  YES (If response is "NO", the following Medicare IM given date fields will be blank) Date Medicare IM given:  04/23/2014 Medicare IM given by:  [name] [name] Date Additional Medicare IM given:   Additional Medicare IM given by:    Discharge Disposition:  [name] [name]  Per UR Regulation:    If discussed at Long Length of Stay Meetings, dates discussed:    Comments:  04/25/2014 [place], [place] [phone] CM referral: [facility] RN, PT met with patient she selected [facility] for home health services.  [facility]/[name] [name] NCM called with referral.

## 2014-04-26 LAB — CULTURE, BLOOD (ROUTINE X 2)
CULTURE: NO GROWTH
Culture: NO GROWTH

## 2014-04-26 NOTE — Discharge Summary (Signed)
I discussed with  Dr Brita Romp.  I agree with their plans documented in their progress note.

## 2014-04-29 ENCOUNTER — Encounter (HOSPITAL_COMMUNITY): Payer: Self-pay | Admitting: Emergency Medicine

## 2014-04-29 ENCOUNTER — Emergency Department (HOSPITAL_COMMUNITY): Payer: PRIVATE HEALTH INSURANCE

## 2014-04-29 ENCOUNTER — Emergency Department (HOSPITAL_COMMUNITY)
Admission: EM | Admit: 2014-04-29 | Discharge: 2014-04-29 | Disposition: A | Discharge status: Home / Self Care | Payer: PRIVATE HEALTH INSURANCE | Attending: Emergency Medicine | Admitting: Emergency Medicine

## 2014-04-29 DIAGNOSIS — M25512 Pain in left shoulder: Secondary | ICD-10-CM | POA: Insufficient documentation

## 2014-04-29 DIAGNOSIS — M79641 Pain in right hand: Secondary | ICD-10-CM | POA: Insufficient documentation

## 2014-04-29 DIAGNOSIS — M25571 Pain in right ankle and joints of right foot: Secondary | ICD-10-CM | POA: Diagnosis not present

## 2014-04-29 DIAGNOSIS — Z7902 Long term (current) use of antithrombotics/antiplatelets: Secondary | ICD-10-CM | POA: Diagnosis not present

## 2014-04-29 DIAGNOSIS — Z720 Tobacco use: Secondary | ICD-10-CM | POA: Insufficient documentation

## 2014-04-29 DIAGNOSIS — M79642 Pain in left hand: Secondary | ICD-10-CM | POA: Insufficient documentation

## 2014-04-29 DIAGNOSIS — M79671 Pain in right foot: Secondary | ICD-10-CM

## 2014-04-29 DIAGNOSIS — J45909 Unspecified asthma, uncomplicated: Secondary | ICD-10-CM | POA: Insufficient documentation

## 2014-04-29 DIAGNOSIS — Z880 Allergy status to penicillin: Secondary | ICD-10-CM | POA: Insufficient documentation

## 2014-04-29 DIAGNOSIS — I129 Hypertensive chronic kidney disease with stage 1 through stage 4 chronic kidney disease, or unspecified chronic kidney disease: Secondary | ICD-10-CM | POA: Diagnosis not present

## 2014-04-29 DIAGNOSIS — Z862 Personal history of diseases of the blood and blood-forming organs and certain disorders involving the immune mechanism: Secondary | ICD-10-CM | POA: Diagnosis not present

## 2014-04-29 DIAGNOSIS — F419 Anxiety disorder, unspecified: Secondary | ICD-10-CM | POA: Diagnosis present

## 2014-04-29 DIAGNOSIS — Z792 Long term (current) use of antibiotics: Secondary | ICD-10-CM | POA: Diagnosis not present

## 2014-04-29 DIAGNOSIS — I509 Heart failure, unspecified: Secondary | ICD-10-CM | POA: Diagnosis not present

## 2014-04-29 DIAGNOSIS — N183 Chronic kidney disease, stage 3 (moderate): Secondary | ICD-10-CM | POA: Insufficient documentation

## 2014-04-29 DIAGNOSIS — Z79899 Other long term (current) drug therapy: Secondary | ICD-10-CM | POA: Insufficient documentation

## 2014-04-29 DIAGNOSIS — M79672 Pain in left foot: Secondary | ICD-10-CM

## 2014-04-29 DIAGNOSIS — E785 Hyperlipidemia, unspecified: Secondary | ICD-10-CM | POA: Diagnosis not present

## 2014-04-29 DIAGNOSIS — K5090 Crohn's disease, unspecified, without complications: Secondary | ICD-10-CM | POA: Diagnosis not present

## 2014-04-29 DIAGNOSIS — M25572 Pain in left ankle and joints of left foot: Secondary | ICD-10-CM | POA: Diagnosis not present

## 2014-04-29 DIAGNOSIS — K219 Gastro-esophageal reflux disease without esophagitis: Secondary | ICD-10-CM | POA: Insufficient documentation

## 2014-04-29 LAB — URINALYSIS, ROUTINE W REFLEX MICROSCOPIC
Bilirubin Urine: NEGATIVE
Glucose, UA: NEGATIVE mg/dL
Hgb urine dipstick: NEGATIVE
Ketones, ur: NEGATIVE mg/dL
Nitrite: NEGATIVE
PH: 5 (ref 5.0–8.0)
Protein, ur: NEGATIVE mg/dL
Specific Gravity, Urine: 1.009 (ref 1.005–1.030)
Urobilinogen, UA: 0.2 mg/dL (ref 0.0–1.0)

## 2014-04-29 LAB — COMPREHENSIVE METABOLIC PANEL
ALK PHOS: 129 U/L — AB (ref 39–117)
ALT: 45 U/L — AB (ref 0–35)
AST: 17 U/L (ref 0–37)
Albumin: 3.4 g/dL — ABNORMAL LOW (ref 3.5–5.2)
Anion gap: 18 — ABNORMAL HIGH (ref 5–15)
BUN: 30 mg/dL — ABNORMAL HIGH (ref 6–23)
CALCIUM: 8.5 mg/dL (ref 8.4–10.5)
CO2: 16 mEq/L — ABNORMAL LOW (ref 19–32)
Chloride: 105 mEq/L (ref 96–112)
Creatinine, Ser: 2.13 mg/dL — ABNORMAL HIGH (ref 0.50–1.10)
GFR calc Af Amer: 28 mL/min — ABNORMAL LOW (ref >90)
GFR calc non Af Amer: 24 mL/min — ABNORMAL LOW (ref >90)
Glucose, Bld: 123 mg/dL — ABNORMAL HIGH (ref 70–99)
POTASSIUM: 4.7 meq/L (ref 3.7–5.3)
Sodium: 139 mEq/L (ref 137–147)
Total Bilirubin: 0.3 mg/dL (ref 0.3–1.2)
Total Protein: 7.6 g/dL (ref 6.0–8.3)

## 2014-04-29 LAB — CBC WITH DIFFERENTIAL/PLATELET
BASOS ABS: 0 10*3/uL (ref 0.0–0.1)
Basophils Relative: 0 % (ref 0–1)
EOS ABS: 0 10*3/uL (ref 0.0–0.7)
EOS PCT: 0 % (ref 0–5)
HCT: 26.6 % — ABNORMAL LOW (ref 36.0–46.0)
Hemoglobin: 8.8 g/dL — ABNORMAL LOW (ref 12.0–15.0)
LYMPHS PCT: 7 % — AB (ref 12–46)
Lymphs Abs: 0.9 10*3/uL (ref 0.7–4.0)
MCH: 31 pg (ref 26.0–34.0)
MCHC: 33.1 g/dL (ref 30.0–36.0)
MCV: 93.7 fL (ref 78.0–100.0)
Monocytes Absolute: 0.5 10*3/uL (ref 0.1–1.0)
Monocytes Relative: 4 % (ref 3–12)
Neutro Abs: 11.2 10*3/uL — ABNORMAL HIGH (ref 1.7–7.7)
Neutrophils Relative %: 89 % — ABNORMAL HIGH (ref 43–77)
Platelets: 274 10*3/uL (ref 150–400)
RBC: 2.84 MIL/uL — AB (ref 3.87–5.11)
RDW: 15 % (ref 11.5–15.5)
WBC: 12.6 10*3/uL — AB (ref 4.0–10.5)

## 2014-04-29 LAB — TROPONIN I: Troponin I: <0.3 ng/mL (ref <0.30)

## 2014-04-29 LAB — URINE MICROSCOPIC-ADD ON

## 2014-04-29 LAB — I-STAT VENOUS BLOOD GAS, ED
Acid-base deficit: 6 mmol/L — ABNORMAL HIGH (ref 0.0–2.0)
Bicarbonate: 19.3 mEq/L — ABNORMAL LOW (ref 20.0–24.0)
O2 Saturation: 69 %
PH VEN: 7.323 — AB (ref 7.250–7.300)
TCO2: 20 mmol/L (ref 0–100)
pCO2, Ven: 37.3 mmHg — ABNORMAL LOW (ref 45.0–50.0)
pO2, Ven: 39 mmHg (ref 30.0–45.0)

## 2014-04-29 MED ORDER — LORAZEPAM 1 MG PO TABS
1.0000 mg | ORAL_TABLET | Freq: Once | ORAL | Status: AC
  Administered 2014-04-29: 1 mg via ORAL
  Filled 2014-04-29: qty 1

## 2014-04-29 NOTE — ED Notes (Signed)
Pt arrived by [facility] with c/o left side/chest "fullness" and tingling and swelling in hands. Pt has hx of anxiety and was tachpneic. Pt stated that this feels different than anxiety. Pt lungs clear. Recently discharged from hospital this past week for salivary gland infection. Also has hx of infection of heart.

## 2014-04-29 NOTE — ED Notes (Signed)
Pt states she started taking antibiotics 4 days ago and that is when the symptoms of swollen hands started, along with tenderness to touch. Pt states she feels as if "my skin is peeling off." Hands are swollen and red. Pt also states she has fullness in her chest. Heart sounds s1 s2 heard. Pulses wnl. ECG taken.

## 2014-04-29 NOTE — ED Notes (Signed)
Abnormal labs given to MD

## 2014-04-29 NOTE — ED Provider Notes (Signed)
CSN: [id number]     Arrival date & time 04/29/14  1535 History   First MD Initiated Contact with Patient 04/29/14 1536     Chief Complaint  Patient presents with  . Anxiety  . Numbness     (Consider location/radiation/quality/duration/timing/severity/associated sxs/prior Treatment) Patient is a 63 y.o. female presenting with general illness.  Illness Location:  Bil hands and feet Quality:  Burning pain and redness Severity:  Moderate Onset quality:  Gradual Duration:  3 days Timing:  Constant Progression:  Worsening Chronicity:  New Context:  Started on cipro and clinda for parotitis/sialoadenitis Relieved by:  Nothing Worsened by:  Palpation, movement Associated symptoms: no abdominal pain, no fever, no loss of consciousness, no nausea, no shortness of breath and no vomiting     Past Medical History  Diagnosis Date  . Crohn's disease   . Hypertension   . Hyperlipidemia   . CHF (congestive heart failure)     EF 30-35% 2012->EF 60-65% 2013  . GERD (gastroesophageal reflux disease)   . 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 Anxiety   . Panic attacks   . CKD (chronic kidney disease), stage III   . Anemia, chronic renal failure   . Asthma 05/2011  . Headache(784.0)     "related to high BP" (05/30/2012)  . Tobacco abuse   . Abnormal chest CT     Coronary atherosclerosis on chest CT 2012   Past Surgical History  Procedure Laterality Date  . 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 Ectopic pregnancy surgery  ?1980's    left  . Cholecystectomy  01/28/2005  . Cataract extraction w/ intraocular lens  implant, bilateral  ~ 2000  . Ileostomy  ?  2002   Family History  Problem Relation Age of Onset  . Heart disease Mother   . Stroke Father    History  Substance Use Topics  . Smoking status: Current Every Day Smoker -- 0.12 packs/day for 18 years    Types: Cigarettes  . Smokeless tobacco: Never Used     Comment: USES E CIGARETTE  . Alcohol Use: No     Comment:  05/30/2012 "used to drink back in the day; last alcohol 23 yr ago"   OB History   Grav Para Term Preterm Abortions TAB SAB Ect Mult Living   2 1 1  0 1 0 0 1 0 1     Review of Systems  Constitutional: Negative for fever.  Respiratory: Negative for shortness of breath.   Gastrointestinal: Negative for nausea, vomiting and abdominal pain.  Neurological: Negative for loss of consciousness.  All other systems reviewed and are negative.     Allergies  Amoxicillin; Aspirin; Morphine and related; Penicillins; Gabapentin; and Ace inhibitors  Home Medications   Prior to Admission medications   Medication Sig Start Date End Date Taking? Authorizing Provider  acetaminophen (TYLENOL) 500 MG tablet Take 1,000 mg by mouth every 6 (six) hours as needed for pain.   Yes Historical Provider, MD  calcium citrate (CALCITRATE - DOSED IN MG ELEMENTAL CALCIUM) 950 MG tablet Take 1 tablet (200 mg of elemental calcium total) by mouth 2 (two) times daily. 05/20/13  Yes Sharon [address], MD  citalopram (CELEXA) 20 MG tablet Take 20 mg by mouth daily.   Yes Historical Provider, MD  clindamycin (CLEOCIN) 150 MG capsule Take 3 capsules (450 mg total) by mouth every 8 (eight) hours. 04/25/14  Yes Lavon Paganini, MD  clopidogrel (PLAVIX) 75 MG tablet Take 1  tablet (75 mg total) by mouth daily with breakfast. 01/19/14  Yes Zigmund Gottron, MD  LORazepam (ATIVAN) 0.5 MG tablet Take 0.5 mg by mouth 2 (two) times daily as needed for anxiety.   Yes Historical Provider, MD  magnesium oxide (MAG-OX) 400 (241.3 MG) MG tablet Take 1 tablet (400 mg total) by mouth daily. 08/07/13  Yes Zigmund Gottron, MD  Mesalamine (ASACOL HD) 800 MG TBEC Take 800 mg by mouth 3 (three) times daily.   Yes Historical Provider, MD  Multiple Vitamin (MULTIVITAMIN WITH MINERALS) TABS tablet Take 1 tablet by mouth 2 (two) times daily.   Yes Historical Provider, MD  simvastatin (ZOCOR) 20 MG tablet Take 20 mg by mouth daily.   Yes  Historical Provider, MD   BP 98/56  Pulse 98  Temp(Src) 99 F (37.2 C) (Oral)  Resp 16  Ht 5' 6"  (1.676 m)  Wt 142 lb (64.411 kg)  BMI 22.93 kg/m2  SpO2 99% Physical Exam  Vitals reviewed. Constitutional: She is oriented to person, place, and time. She appears well-developed and well-nourished.  HENT:  Head: Normocephalic and atraumatic.  Right Ear: External ear normal.  Left Ear: External ear normal.  Eyes: Conjunctivae and EOM are normal. Pupils are equal, round, and reactive to light.  Neck: Normal range of motion. Neck supple.  Cardiovascular: Normal rate, regular rhythm, normal heart sounds and intact distal pulses.   Pulmonary/Chest: Effort normal and breath sounds normal.  Abdominal: Soft. Bowel sounds are normal. There is no tenderness.  Musculoskeletal: Normal range of motion.  bil erythematous palms and soles with mild swelling and tender  Neurological: She is alert and oriented to person, place, and time.  Skin: Skin is warm and dry.    ED Course  Procedures (including critical care time) Labs Review Labs Reviewed  CBC WITH DIFFERENTIAL - Abnormal; Notable for the following:    WBC 12.6 (*)    RBC 2.84 (*)    Hemoglobin 8.8 (*)    HCT 26.6 (*)    Neutrophils Relative % 89 (*)    Neutro Abs 11.2 (*)    Lymphocytes Relative 7 (*)    All other components within normal limits  COMPREHENSIVE METABOLIC PANEL - Abnormal; Notable for the following:    CO2 16 (*)    Glucose, Bld 123 (*)    BUN 30 (*)    Creatinine, Ser 2.13 (*)    Albumin 3.4 (*)    ALT 45 (*)    Alkaline Phosphatase 129 (*)    GFR calc non Af Amer 24 (*)    GFR calc Af Amer 28 (*)    Anion gap 18 (*)    All other components within normal limits  URINALYSIS, ROUTINE W REFLEX MICROSCOPIC - Abnormal; Notable for the following:    APPearance CLOUDY (*)    Leukocytes, UA MODERATE (*)    All other components within normal limits  URINE MICROSCOPIC-ADD ON - Abnormal; Notable for the following:     Bacteria, UA FEW (*)    All other components within normal limits  I-STAT VENOUS BLOOD GAS, ED - Abnormal; Notable for the following:    pH, Ven 7.323 (*)    pCO2, Ven 37.3 (*)    Bicarbonate 19.3 (*)    Acid-base deficit 6.0 (*)    All other components within normal limits  TROPONIN I  BLOOD GAS, VENOUS    Imaging Review Dg Chest 2 View  04/29/2014   CLINICAL DATA:  Left chest  fullness. Tingling and swelling in both hands.  EXAM: CHEST  2 VIEW  COMPARISON:  PA and lateral chest 05/15/2013.  FINDINGS: Mild linear scarring in the left lung base is unchanged. Mild thickening of the minor fissure is also unchanged. Lungs are otherwise clear. Heart size is normal. No pneumothorax or effusion. Heart size is normal.  IMPRESSION: No acute disease.   Electronically Signed   By: Inge Rise [name]   On: 04/29/2014 17:34     EKG Interpretation   Date/Time:  Sunday April 29 2014 16:19:35 EDT Ventricular Rate:  90 PR Interval:  143 QRS Duration: 94 QT Interval:  413 QTC Calculation: 505 R Axis:   17 Text Interpretation:  Sinus rhythm Abnormal R-wave progression, early  transition Borderline prolonged QT interval No significant change since  last tracing Confirmed by Debby Freiberg [phone]) on 04/29/2014 6:29:28 PM      MDM   Final diagnoses:  Left shoulder pain  Bilateral hand pain  Bilateral foot pain    63 y.o. female with pertinent PMH of recent parotitis/sialoadenitis, anxiety, ckd, crohn's dz, CHF (30-35%) presents with bil hand/foot swelling and pain.  Pt states symptoms started shortly after leaving the hospital for parotitis and sialoadenitis and began taking oral clinda and cipro.  On arrival today vitals and physical exam as above.  Pt has mild erythema over palms and soles, but no blistering lesions or mucous membrane involvement.  Labs and imaging as above obtained due to L shoulder pain which were unremarkable.  Likely etiology of symptoms is medication reaction to  cipro.  Doubt clindamycin as causative agent.  No signs of SSS, TSS, TEN, or SJS on my exam.  Pt informed of possible etiology, as well as possibility of permanent peripheral neuropathy.  Will have her dc cipro, but continue clinda.  DC home in stable condition with strict return precautions.    1. Left shoulder pain   2. Bilateral hand pain   3. Bilateral foot pain         Debby Freiberg, MD 04/30/14 [phone]

## 2014-04-29 NOTE — ED Notes (Signed)
Pt back from x-ray.

## 2014-04-29 NOTE — Discharge Instructions (Signed)
Discontinue use of ciprofloxacin immediately Rash A rash is a change in the color or texture of your skin. There are many different types of rashes. You may have other problems that accompany your rash. CAUSES   Infections.  Allergic reactions. This can include allergies to pets or foods.  Certain medicines.  Exposure to certain chemicals, soaps, or cosmetics.  Heat.  Exposure to poisonous plants.  Tumors, both cancerous and noncancerous. SYMPTOMS   Redness.  Scaly skin.  Itchy skin.  Dry or cracked skin.  Bumps.  Blisters.  Pain. DIAGNOSIS  Your caregiver may do a physical exam to determine what type of rash you have. A skin sample (biopsy) may be taken and examined under a microscope. TREATMENT  Treatment depends on the type of rash you have. Your caregiver may prescribe certain medicines. For serious conditions, you may need to see a skin doctor (dermatologist). HOME CARE INSTRUCTIONS   Avoid the substance that caused your rash.  Do not scratch your rash. This can cause infection.  You may take cool baths to help stop itching.  Only take over-the-counter or prescription medicines as directed by your caregiver.  Keep all follow-up appointments as directed by your caregiver. SEEK IMMEDIATE MEDICAL CARE IF:  You have increasing pain, swelling, or redness.  You have a fever.  You have new or severe symptoms.  You have body aches, diarrhea, or vomiting.  Your rash is not better after 3 days. MAKE SURE YOU:  Understand these instructions.  Will watch your condition.  Will get help right away if you are not doing well or get worse. Document Released: 06/12/2002 Document Revised: 09/14/2011 Document Reviewed: 04/06/2011 [facility] Patient Information 2015 [place], [place]. This information is not intended to replace advice given to you by your health care provider. Make sure you discuss any questions you have with your health care provider.

## 2014-04-30 ENCOUNTER — Telehealth: Payer: Self-pay | Admitting: *Deleted

## 2014-04-30 NOTE — Telephone Encounter (Signed)
Clair Gulling, Physical Therapist with [facility] left voice message stating he received a referral for pt after recent hospitalization.  He contacted pt, but pt delayed therapy until 05/01/2014.  If you have questions please give him a call at [phone].  Derl Barrow, RN

## 2014-05-01 ENCOUNTER — Telehealth: Payer: Self-pay | Admitting: Family Medicine

## 2014-05-01 ENCOUNTER — Telehealth: Payer: Self-pay | Admitting: *Deleted

## 2014-05-01 NOTE — Telephone Encounter (Signed)
Attempted to call.  Correct number is [phone].  No answer.  Will try again tomorrow.  I am reassured that these do not sound like serious or life threatening symptoms and that she already has an appointment with me on 10/30 (3 more days.)  Will try calling again tomorrow.

## 2014-05-01 NOTE — Telephone Encounter (Signed)
Called Clair Gulling and heard symptoms.  I had tried to call the patient and had the phone number transposed.  The correct number is [phone]

## 2014-05-01 NOTE — Telephone Encounter (Signed)
Clair Gulling, Physical Therapist with [facility] called requesting verbal orders to continue treatment to reduce fall risk from recent hospitalization.  Pt went ER on 04/30/2014 regarding pain and numbness in hand/feet.  Doctors reported adverse reaction to new medication Cipro.  Pt told to stop medication.  Pt still having numbness in hands/feet.  Appt for Friday 05/04/14 at 10 AM.  Please give Clair Gulling a call at [phone].  Derl Barrow, RN

## 2014-05-01 NOTE — Telephone Encounter (Signed)
Pt called to let Dr. Andria Frames know that she had to go to the ER over the weekend because of a allergic reaction from Ciprofloxacin 250 mg. She said that her hands, feet, and mouth were swollen and tingly. The ER thought is was a reaction to her other medication Citalopram so she stop taking this. She is still taking her Clindamycin 150 mg. She has not had the money to get her Lorazepam and her Magnesium. jw

## 2014-05-02 ENCOUNTER — Ambulatory Visit: Payer: PRIVATE HEALTH INSURANCE | Admitting: Family Medicine

## 2014-05-02 NOTE — Telephone Encounter (Signed)
Still having some swelling of hands and feet.  Not getting worse.  No SOB.  Has appointment in two days.  No changes and will reevaluate at that time.

## 2014-05-03 ENCOUNTER — Telehealth: Payer: Self-pay | Admitting: Family Medicine

## 2014-05-03 NOTE — Telephone Encounter (Signed)
Will address at the visit tomorrow.  ENT was consulted as an inpatient.

## 2014-05-03 NOTE — Telephone Encounter (Signed)
[facility] nurse called and wanted Dr. Andria Frames to know that the patient was not taking her Clindamycin correctly. She was only taking twice a day instead of 3 times day. She instructed the patient on the correct dosage . jw

## 2014-05-03 NOTE — Telephone Encounter (Signed)
Pt called and would like a referral to see a ENT doctor. She will be in the office on 10/30 for a follow up jw

## 2014-05-03 NOTE — Telephone Encounter (Signed)
Noted and I see her tomorrow in the office.

## 2014-05-04 ENCOUNTER — Encounter: Payer: Self-pay | Admitting: Family Medicine

## 2014-05-04 ENCOUNTER — Ambulatory Visit (INDEPENDENT_AMBULATORY_CARE_PROVIDER_SITE_OTHER): Payer: PRIVATE HEALTH INSURANCE | Admitting: Family Medicine

## 2014-05-04 DIAGNOSIS — K1120 Sialoadenitis, unspecified: Secondary | ICD-10-CM

## 2014-05-04 DIAGNOSIS — N183 Chronic kidney disease, stage 3 unspecified: Secondary | ICD-10-CM

## 2014-05-04 DIAGNOSIS — D631 Anemia in chronic kidney disease: Secondary | ICD-10-CM

## 2014-05-04 DIAGNOSIS — L301 Dyshidrosis [pompholyx]: Secondary | ICD-10-CM

## 2014-05-04 DIAGNOSIS — N189 Chronic kidney disease, unspecified: Secondary | ICD-10-CM

## 2014-05-04 LAB — RENAL FUNCTION PANEL
Albumin: 3.6 g/dL (ref 3.5–5.2)
BUN: 27 mg/dL — ABNORMAL HIGH (ref 6–23)
CALCIUM: 9 mg/dL (ref 8.4–10.5)
CO2: 13 meq/L — AB (ref 19–32)
CREATININE: 1.66 mg/dL — AB (ref 0.50–1.10)
Chloride: 112 mEq/L (ref 96–112)
Glucose, Bld: 89 mg/dL (ref 70–99)
Phosphorus: 3.5 mg/dL (ref 2.3–4.6)
Potassium: 4.3 mEq/L (ref 3.5–5.3)
Sodium: 136 mEq/L (ref 135–145)

## 2014-05-04 LAB — CBC
HCT: 28.3 % — ABNORMAL LOW (ref 36.0–46.0)
Hemoglobin: 9.2 g/dL — ABNORMAL LOW (ref 12.0–15.0)
MCH: 30.4 pg (ref 26.0–34.0)
MCHC: 32.5 g/dL (ref 30.0–36.0)
MCV: 93.4 fL (ref 78.0–100.0)
Platelets: 299 10*3/uL (ref 150–400)
RBC: 3.03 MIL/uL — ABNORMAL LOW (ref 3.87–5.11)
RDW: 15.5 % (ref 11.5–15.5)
WBC: 8.3 10*3/uL (ref 4.0–10.5)

## 2014-05-04 LAB — MAGNESIUM: Magnesium: 1.2 mg/dL — ABNORMAL LOW (ref 1.5–2.5)

## 2014-05-04 MED ORDER — TRIAMCINOLONE ACETONIDE 0.1 % EX CREA: 1.0000 "application " | TOPICAL_CREAM | Freq: Two times a day (BID) | CUTANEOUS | Status: DC

## 2014-05-04 NOTE — Assessment & Plan Note (Signed)
Doubt due to cipro.  Treat per usual with steroids.

## 2014-05-04 NOTE — Patient Instructions (Signed)
You are doing well Continue the antibiotic until gone. Stay on all your normal medications. I will call with the blood test results on Monday.  I am checking several things, including your magnesium The nurse will work with you to get the referral to Dr. Constance Holster I sent in a new ointment for your hands.  Use twice a day.

## 2014-05-04 NOTE — Progress Notes (Signed)
   Subjective:    Patient ID: [name] [name], female    DOB: June 20, 1951, 63 y.o.   MRN: [id number]  HPI FU hospitalization and ER visit Siloadenitis, clinically improving.  Only on clinda.  No diarrhea Will need authorization for ENT FU Numbness of hands and feet.  Some rash on hands.  Seen in ER and attributed to Cipro reaction.  Off Cipro and not getting worse. She is anxious but not hyperventilating with the numbness. She has a history of hypomag and hypokalemia.  Off supplemental mag for one month.    Review of Systems     Objective:   Physical Exam Mild swelling, non tender left parotid.  Much improved from hospital Neck - only adenopathy is adjacent to the parotid. Lungs clear Hands no edema.  Does have eczematous rash (hx of eczema) Feet subjectively numb, no swelling or rash.        Assessment & Plan:

## 2014-05-04 NOTE — Assessment & Plan Note (Signed)
I wonder if electrolyte problem could be contributing to her numbness.

## 2014-05-04 NOTE — Assessment & Plan Note (Signed)
Recheck labs 

## 2014-05-04 NOTE — Assessment & Plan Note (Signed)
Improving.  Complete Clinda course.  See ENT for FU.

## 2014-05-07 ENCOUNTER — Encounter: Payer: Self-pay | Admitting: Family Medicine

## 2014-06-02 ENCOUNTER — Other Ambulatory Visit: Payer: Self-pay | Admitting: Family Medicine

## 2014-06-09 ENCOUNTER — Other Ambulatory Visit: Payer: Self-pay | Admitting: Family Medicine

## 2014-08-29 ENCOUNTER — Other Ambulatory Visit: Payer: Self-pay | Admitting: Family Medicine

## 2014-08-29 DIAGNOSIS — E7800 Pure hypercholesterolemia, unspecified: Secondary | ICD-10-CM

## 2014-08-30 NOTE — Assessment & Plan Note (Signed)
Refill per e request

## 2014-09-04 ENCOUNTER — Other Ambulatory Visit: Payer: Self-pay | Admitting: Family Medicine

## 2014-09-06 DIAGNOSIS — F419 Anxiety disorder, unspecified: Secondary | ICD-10-CM | POA: Diagnosis not present

## 2014-09-07 ENCOUNTER — Encounter: Payer: Self-pay | Admitting: *Deleted

## 2014-09-07 NOTE — Progress Notes (Signed)
Pt in nurse clinic for complaints of wt loss, weakness, fatigue and insomnia.  Review pt's wt loss, pt lost 10 lb since last visit 04/2014.  Pt stated she is not trying to loss any weight.  Pt stated she does not go to until the sun comes up.  Pt asked if her anxiety medication help her sleep.  Pt takes the medication at 11 PM.  Advised to take medication at 8 PM, eat dinner before 8 and then try to relax for bed and see if that help. Precept with Dr. Nori Riis; pt should follow up with PCP.  Pt denies any other symptoms.  Will forward to PCP.  Derl Barrow, RN

## 2014-09-10 NOTE — Progress Notes (Signed)
Agree with advice given

## 2014-09-12 ENCOUNTER — Ambulatory Visit: Payer: Medicaid Other | Admitting: Family Medicine

## 2014-09-19 ENCOUNTER — Ambulatory Visit (INDEPENDENT_AMBULATORY_CARE_PROVIDER_SITE_OTHER): Payer: Medicare Other | Admitting: Family Medicine

## 2014-09-19 VITALS — BP 108/75 | HR 88 | Temp 98.9°F | Ht 66.0 in | Wt 132.0 lb

## 2014-09-19 DIAGNOSIS — Z114 Encounter for screening for human immunodeficiency virus [HIV]: Secondary | ICD-10-CM

## 2014-09-19 DIAGNOSIS — Z23 Encounter for immunization: Secondary | ICD-10-CM | POA: Diagnosis not present

## 2014-09-19 DIAGNOSIS — F419 Anxiety disorder, unspecified: Secondary | ICD-10-CM | POA: Diagnosis not present

## 2014-09-19 DIAGNOSIS — N183 Chronic kidney disease, stage 3 unspecified: Secondary | ICD-10-CM

## 2014-09-19 DIAGNOSIS — K50919 Crohn's disease, unspecified, with unspecified complications: Secondary | ICD-10-CM

## 2014-09-19 DIAGNOSIS — D631 Anemia in chronic kidney disease: Secondary | ICD-10-CM

## 2014-09-19 DIAGNOSIS — N189 Chronic kidney disease, unspecified: Secondary | ICD-10-CM

## 2014-09-19 DIAGNOSIS — Z1159 Encounter for screening for other viral diseases: Secondary | ICD-10-CM | POA: Insufficient documentation

## 2014-09-19 LAB — CBC
HEMATOCRIT: 33.5 % — AB (ref 36.0–46.0)
Hemoglobin: 11 g/dL — ABNORMAL LOW (ref 12.0–15.0)
MCH: 29.9 pg (ref 26.0–34.0)
MCHC: 32.8 g/dL (ref 30.0–36.0)
MCV: 91 fL (ref 78.0–100.0)
MPV: 9.3 fL (ref 8.6–12.4)
PLATELETS: 189 10*3/uL (ref 150–400)
RBC: 3.68 MIL/uL — ABNORMAL LOW (ref 3.87–5.11)
RDW: 14.9 % (ref 11.5–15.5)
WBC: 6.4 10*3/uL (ref 4.0–10.5)

## 2014-09-19 LAB — MAGNESIUM: Magnesium: 1.8 mg/dL (ref 1.5–2.5)

## 2014-09-19 LAB — BASIC METABOLIC PANEL
BUN: 19 mg/dL (ref 6–23)
CO2: 17 mEq/L — ABNORMAL LOW (ref 19–32)
CREATININE: 1.74 mg/dL — AB (ref 0.50–1.10)
Calcium: 9 mg/dL (ref 8.4–10.5)
Chloride: 113 mEq/L — ABNORMAL HIGH (ref 96–112)
Glucose, Bld: 77 mg/dL (ref 70–99)
Potassium: 4.2 mEq/L (ref 3.5–5.3)
Sodium: 139 mEq/L (ref 135–145)

## 2014-09-19 LAB — TSH: TSH: 0.522 u[IU]/mL (ref 0.350–4.500)

## 2014-09-19 NOTE — Patient Instructions (Signed)
Your weight has been stable over the past two weeks and you look good.  I doubt this is a medical problem.   I will send a letter with your lab results. Stay on the same medicines. For the next two weeks, I want you to take a lorazepam pill before bed.  It will help you get your days and nights straightened back up. You will also get a flu shot today. You have already had the shingles vaccine.

## 2014-09-19 NOTE — Assessment & Plan Note (Addendum)
Bmp recheck

## 2014-09-20 ENCOUNTER — Encounter: Payer: Self-pay | Admitting: Family Medicine

## 2014-09-20 LAB — HIV ANTIBODY (ROUTINE TESTING W REFLEX): HIV: NONREACTIVE

## 2014-09-20 NOTE — Assessment & Plan Note (Signed)
Recheck CBC

## 2014-09-20 NOTE — Assessment & Plan Note (Signed)
Recheck labs 

## 2014-09-20 NOTE — Assessment & Plan Note (Signed)
Doubt flair causing wt loss.  No GI symptoms and on her chronic maint meds.

## 2014-09-20 NOTE — Progress Notes (Signed)
   Subjective:    Patient ID: [name] [name], female    DOB: 1951/02/01, 63 y.o.   MRN: [id number]  HPI Patient had some unexplained wt loss.  Stable over past two weeks. Feels fine except not sleeping well.   Stool output steady.  Appetite normal Due for flu shot     Review of Systems     Objective:   Physical ExamNeck, no sig nodes Lungs clear Cardiac RRR without m or g Abd benign.        Assessment & Plan:

## 2014-09-20 NOTE — Assessment & Plan Note (Signed)
Only taking lorazepam intermitantly.  Due to insomnia (acute problem, generally a good sleeper) will advise to take one lorazepam qhs.

## 2014-09-27 ENCOUNTER — Telehealth: Payer: Self-pay | Admitting: *Deleted

## 2014-09-27 NOTE — Telephone Encounter (Signed)
Received fax request for prior authorization from pharmacy for Asacol.  [facility]rior authorization form placed in Dr. Lowella Bandy box for completion.  Burna Forts, BSN, RN-BC

## 2014-09-27 NOTE — Telephone Encounter (Signed)
Done

## 2014-09-28 ENCOUNTER — Ambulatory Visit: Payer: Medicaid Other | Admitting: Cardiovascular Disease

## 2014-10-01 NOTE — Telephone Encounter (Signed)
PA approved from [employer] for Asacol HD until 07/06/15.  Reference number: [id number].  [facility] pharmacy is aware of approval.  Derl Barrow, RN

## 2014-11-05 ENCOUNTER — Telehealth: Payer: Self-pay | Admitting: Family Medicine

## 2014-11-05 NOTE — Telephone Encounter (Signed)
Tried to call patient back but home number doesn't work.  I did leave a message on her cell number for her to contact the office.  She will need to call [facility] and ask them what home health place they are in network with.  Then we can send her order for these supplies there. Jazmin Hartsell,CMA

## 2014-11-05 NOTE — Telephone Encounter (Signed)
[place] doesn't accept [employer] card for her ostomy supplies Needs help in finding somewhere she can order her supplies and that takes her insurance

## 2014-11-06 NOTE — Telephone Encounter (Signed)
Will forward to [name], CSW to inquire about coverage. [name] [name], [name]

## 2014-11-07 NOTE — Telephone Encounter (Signed)
Will forward to Dr. Andria Frames to make him aware. Jazmin Hartsell,CMA

## 2014-11-07 NOTE — Telephone Encounter (Signed)
[name] [name] is for personal care services they dont provide [facility] or DME. CSW tried to contact pt home number however is not working. Mobile number listed was answered by a wrong number - CSW has removed the number. If pt calls back please ask what supplies she needs. We can send this to [facility].  [name], MSW, [place]

## 2014-11-07 NOTE — Telephone Encounter (Signed)
I am aware of the situation, agree with the efforts of the team, and will try to address with her during her next office visit - sooner if she calls back.

## 2014-11-21 ENCOUNTER — Ambulatory Visit (INDEPENDENT_AMBULATORY_CARE_PROVIDER_SITE_OTHER): Payer: Medicare Other | Admitting: Family Medicine

## 2014-11-21 ENCOUNTER — Encounter: Payer: Self-pay | Admitting: Family Medicine

## 2014-11-21 VITALS — BP 120/68 | HR 87 | Temp 98.5°F | Ht 66.0 in | Wt 134.0 lb

## 2014-11-21 DIAGNOSIS — J309 Allergic rhinitis, unspecified: Secondary | ICD-10-CM | POA: Insufficient documentation

## 2014-11-21 DIAGNOSIS — J301 Allergic rhinitis due to pollen: Secondary | ICD-10-CM

## 2014-11-21 DIAGNOSIS — K5090 Crohn's disease, unspecified, without complications: Secondary | ICD-10-CM

## 2014-11-21 MED ORDER — CETIRIZINE HCL 10 MG PO TABS: 10.0000 mg | ORAL_TABLET | Freq: Every day | ORAL | Status: DC

## 2014-11-21 NOTE — Progress Notes (Signed)
   Subjective:    Patient ID: [name] [name], female    DOB: Oct 17, 1950, 64 y.o.   MRN: [id number]  HPI [phone] is her current contact number.  [name] [name] comes in quite upset.  Although she is prone to anxiety, her frustrations seem well grounded.  She had been getting a regular supply of ostomy products for her sigmoid colostomy through [facility].  She signed up for a new insurance starting the first of this year.  (I believe she changed from red, white and blue Medicare to a [facility] Medicare advantage product.)  Per the patient, [name] [name] referred her to another company who she later found did not participate with [facility].  She has tried several places.  She is out of her ostomy supplies and is using old, larger bags left over from her 2013 revision.  She is starting to get skin breakdown at the exposed skin.  She will be completely out of supplies in less than one week.   Review of Systems     Objective:   Physical Exam Abd benign.  No bleeding at skin.  Bag not removed.        Assessment & Plan:

## 2014-11-21 NOTE — Patient Instructions (Signed)
Constance Holster and I will work on getting you supplies before the weekend I sent in a prescription for allergy medication. See me in 3 months.

## 2014-11-22 NOTE — Assessment & Plan Note (Signed)
S/P sigmoid colostomy.  Enteritis now stable but desperately needs ostomy supplies.  I involved our social worker, [name], who will help me get supplies promptly.

## 2014-11-22 NOTE — Progress Notes (Signed)
CSW has faxed orders for ostomy supplies to [facility]. CSW informed that it could take up to 5 days for processing but the order has been placed as urgent. CSW has LVM for pt to notify her.  [name], MSW, [place]

## 2014-11-26 DIAGNOSIS — Z933 Colostomy status: Secondary | ICD-10-CM | POA: Diagnosis not present

## 2014-11-26 DIAGNOSIS — K5090 Crohn's disease, unspecified, without complications: Secondary | ICD-10-CM | POA: Diagnosis not present

## 2014-11-28 ENCOUNTER — Telehealth: Payer: Self-pay | Admitting: Clinical

## 2014-11-28 NOTE — Telephone Encounter (Signed)
CSW received a call from pt thanking CSW for assisting with pts ostomy supplies. Pt received her supplies yesterday and is pleased with the delivery company. Pt will complete paperwork to have [facility] continue providing her supplies. [name], MSW, [place]

## 2014-12-16 ENCOUNTER — Other Ambulatory Visit: Payer: Self-pay | Admitting: Family Medicine

## 2014-12-24 ENCOUNTER — Ambulatory Visit: Payer: Medicare Other | Admitting: Family Medicine

## 2015-01-09 ENCOUNTER — Ambulatory Visit (INDEPENDENT_AMBULATORY_CARE_PROVIDER_SITE_OTHER): Payer: Medicare Other | Admitting: Family Medicine

## 2015-01-09 ENCOUNTER — Encounter: Payer: Self-pay | Admitting: Family Medicine

## 2015-01-09 VITALS — BP 111/64 | HR 98 | Temp 98.4°F | Wt 132.0 lb

## 2015-01-09 DIAGNOSIS — B354 Tinea corporis: Secondary | ICD-10-CM

## 2015-01-09 MED ORDER — KETOCONAZOLE 2 % EX CREA
1.0000 | TOPICAL_CREAM | Freq: Two times a day (BID) | CUTANEOUS | Status: DC
Start: 2015-01-09 — End: 2016-03-19

## 2015-01-09 NOTE — Patient Instructions (Signed)
Use the ketoconazole 2% cream on the affected areas and surrounding area (about 0.5"), use it twice a day  If not improving within the next 1-2 weeks, call the clinic and we will either have you come back to see one of the doctors or we may refer you to the wound clinic to see if they have any advice for products to help protect the skin.

## 2015-01-09 NOTE — Progress Notes (Signed)
   Subjective:    Patient ID: [name] [name], female    DOB: Jun 10, 1951, 64 y.o.   MRN: [id number]  HPI  Patient presents for Same Day Appointment  CC: rash  # Rash:  Has had a rash for about 1 month around her ostomy and also on the other side of her abdomen on 2 spots  Very itchy, but not painful  Skin has not broken down  Ostomy is doing okay, no issues ROS: no fevers or chills  Review of Systems   See HPI for ROS. All other systems reviewed and are negative.  Past medical history, surgical, family, and social history reviewed and updated in the EMR as appropriate.  Objective:  BP 111/64 mmHg  Pulse 98  Temp(Src) 98.4 F (36.9 C) (Oral)  Wt 132 lb (59.875 kg) Vitals and nursing note reviewed  General: NAD Abdomen: left sided ostomy is pink, well appearing. Skin: hyperpigmented areas surrounding ostomy on abdomen with satellite lesions ring/slightly less pigmented central clearing on the right side of her abdomen. No breaks in the skin noted  Assessment & Plan:  See Problem List Documentation

## 2015-01-10 DIAGNOSIS — B354 Tinea corporis: Secondary | ICD-10-CM | POA: Insufficient documentation

## 2015-01-10 NOTE — Assessment & Plan Note (Signed)
Itchy rash surrounding ostomy and right side of abdomen with appearance of fungal skin infection. Will treat with ketoconazole x 2 weeks, f/u if not improving. If continues may need wound/ostomy care consult for barrier protection and instructions on proper cleaning of the skin.

## 2015-01-19 ENCOUNTER — Other Ambulatory Visit: Payer: Self-pay | Admitting: Family Medicine

## 2015-01-28 DIAGNOSIS — K5090 Crohn's disease, unspecified, without complications: Secondary | ICD-10-CM | POA: Diagnosis not present

## 2015-01-28 DIAGNOSIS — Z933 Colostomy status: Secondary | ICD-10-CM | POA: Diagnosis not present

## 2015-02-06 IMAGING — CR DG CHEST 1V PORT
1 series · 1 of 1 positions shown · non-contrast
Comparison: 05/30/2012

CLINICAL DATA: Left-sided chest pain.  History of smoking and
hypertension.

PORTABLE CHEST - 1 VIEW

[AP]
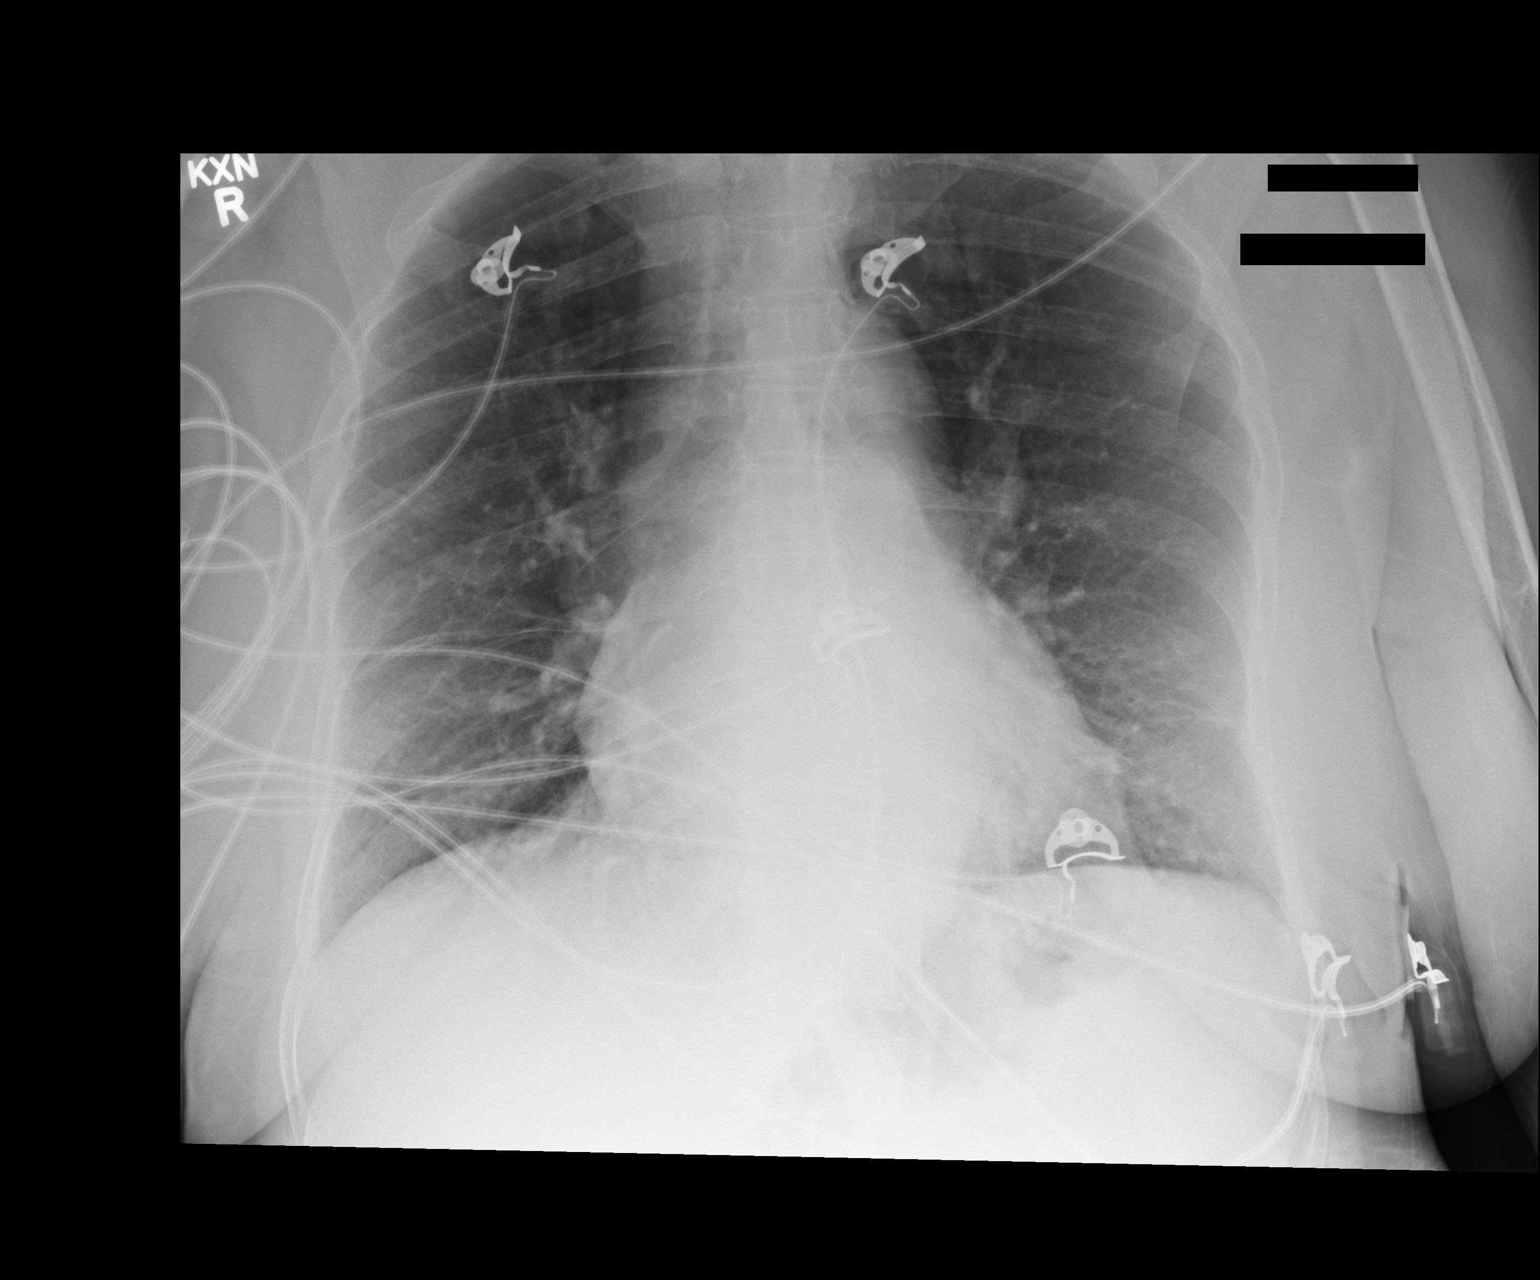

[1 of 1 positions shown; findings below may reference images not displayed]

FINDINGS: Artifact overlies the chest.  Heart size is normal.
Mediastinal shadows are unremarkable.  There is mild pulmonary
scarring but no evidence of active disease.  No edema.  No
effusions.  No significant bony finding.
IMPRESSION: Mild pulmonary scarring.  No active disease.

## 2015-03-25 DIAGNOSIS — K5090 Crohn's disease, unspecified, without complications: Secondary | ICD-10-CM | POA: Diagnosis not present

## 2015-03-25 DIAGNOSIS — Z933 Colostomy status: Secondary | ICD-10-CM | POA: Diagnosis not present

## 2015-04-18 ENCOUNTER — Ambulatory Visit: Payer: Medicare Other | Admitting: Family Medicine

## 2015-05-18 ENCOUNTER — Other Ambulatory Visit: Payer: Self-pay | Admitting: Family Medicine

## 2015-05-24 DIAGNOSIS — Z933 Colostomy status: Secondary | ICD-10-CM | POA: Diagnosis not present

## 2015-05-24 DIAGNOSIS — K5090 Crohn's disease, unspecified, without complications: Secondary | ICD-10-CM | POA: Diagnosis not present

## 2015-07-04 ENCOUNTER — Other Ambulatory Visit: Payer: Self-pay | Admitting: Family Medicine

## 2015-07-24 DIAGNOSIS — K5090 Crohn's disease, unspecified, without complications: Secondary | ICD-10-CM | POA: Diagnosis not present

## 2015-07-24 DIAGNOSIS — Z933 Colostomy status: Secondary | ICD-10-CM | POA: Diagnosis not present

## 2015-09-05 ENCOUNTER — Other Ambulatory Visit: Payer: Self-pay | Admitting: *Deleted

## 2015-09-05 MED ORDER — SIMVASTATIN 20 MG PO TABS: 20.0000 mg | ORAL_TABLET | Freq: Every day | ORAL | Status: DC

## 2015-09-05 MED ORDER — CITALOPRAM HYDROBROMIDE 20 MG PO TABS: 20.0000 mg | ORAL_TABLET | Freq: Every day | ORAL | Status: DC

## 2015-09-12 ENCOUNTER — Encounter (HOSPITAL_COMMUNITY): Payer: Self-pay

## 2015-09-12 ENCOUNTER — Emergency Department (HOSPITAL_COMMUNITY)
Admission: EM | Admit: 2015-09-12 | Discharge: 2015-09-12 | Disposition: A | Discharge status: Home / Self Care | Payer: Medicaid Other | Attending: Emergency Medicine | Admitting: Emergency Medicine

## 2015-09-12 DIAGNOSIS — F419 Anxiety disorder, unspecified: Secondary | ICD-10-CM | POA: Diagnosis not present

## 2015-09-12 DIAGNOSIS — N183 Chronic kidney disease, stage 3 (moderate): Secondary | ICD-10-CM | POA: Diagnosis not present

## 2015-09-12 DIAGNOSIS — R51 Headache: Secondary | ICD-10-CM | POA: Insufficient documentation

## 2015-09-12 DIAGNOSIS — I509 Heart failure, unspecified: Secondary | ICD-10-CM | POA: Diagnosis not present

## 2015-09-12 DIAGNOSIS — I6789 Other cerebrovascular disease: Secondary | ICD-10-CM | POA: Diagnosis not present

## 2015-09-12 DIAGNOSIS — I129 Hypertensive chronic kidney disease with stage 1 through stage 4 chronic kidney disease, or unspecified chronic kidney disease: Secondary | ICD-10-CM | POA: Diagnosis not present

## 2015-09-12 DIAGNOSIS — R200 Anesthesia of skin: Secondary | ICD-10-CM | POA: Diagnosis not present

## 2015-09-12 DIAGNOSIS — R202 Paresthesia of skin: Secondary | ICD-10-CM | POA: Diagnosis not present

## 2015-09-12 NOTE — ED Notes (Signed)
Pt brought in via EMS for c/o headache that started tonight at 2045, worse with inspiration. EMS reports pt was hyperventilating upon their arrival and was encouraged to slow her breathing down and once she did she stated she felt better and her HA decreased, "its not bas bad as it was." BP- 134/64, HR- 90, CBG-206.

## 2015-09-12 NOTE — ED Notes (Signed)
Pt leaving due to wait times

## 2015-09-24 ENCOUNTER — Telehealth: Payer: Self-pay | Admitting: *Deleted

## 2015-09-24 NOTE — Telephone Encounter (Signed)
Prior Authorization received from [facility] for Mesalamine DR 800 mg. PA form completed and faxed to [employer] for review. Review process could take 24-72 hours to complete. Derl Barrow, RN

## 2015-09-25 NOTE — Telephone Encounter (Signed)
PA approved for Mesalamine DR 800 mg tablet until 07/05/2016 via [employer].  Reference number: [id number].  Derl Barrow, RN

## 2015-10-02 DIAGNOSIS — Z933 Colostomy status: Secondary | ICD-10-CM | POA: Diagnosis not present

## 2015-10-02 DIAGNOSIS — K5090 Crohn's disease, unspecified, without complications: Secondary | ICD-10-CM | POA: Diagnosis not present

## 2015-10-09 ENCOUNTER — Emergency Department (HOSPITAL_COMMUNITY)
Admission: EM | Admit: 2015-10-09 | Discharge: 2015-10-09 | Disposition: A | Discharge status: Home / Self Care | Payer: Medicare Other | Attending: Emergency Medicine | Admitting: Emergency Medicine

## 2015-10-09 ENCOUNTER — Encounter (HOSPITAL_COMMUNITY): Payer: Self-pay | Admitting: *Deleted

## 2015-10-09 ENCOUNTER — Emergency Department (HOSPITAL_COMMUNITY): Payer: Medicare Other

## 2015-10-09 DIAGNOSIS — D638 Anemia in other chronic diseases classified elsewhere: Secondary | ICD-10-CM

## 2015-10-09 DIAGNOSIS — F410 Panic disorder [episodic paroxysmal anxiety] without agoraphobia: Secondary | ICD-10-CM | POA: Diagnosis not present

## 2015-10-09 DIAGNOSIS — R000 Tachycardia, unspecified: Secondary | ICD-10-CM | POA: Diagnosis not present

## 2015-10-09 DIAGNOSIS — K5090 Crohn's disease, unspecified, without complications: Secondary | ICD-10-CM | POA: Diagnosis not present

## 2015-10-09 DIAGNOSIS — R0789 Other chest pain: Secondary | ICD-10-CM | POA: Diagnosis not present

## 2015-10-09 DIAGNOSIS — N289 Disorder of kidney and ureter, unspecified: Secondary | ICD-10-CM | POA: Diagnosis not present

## 2015-10-09 DIAGNOSIS — E785 Hyperlipidemia, unspecified: Secondary | ICD-10-CM | POA: Diagnosis not present

## 2015-10-09 DIAGNOSIS — R109 Unspecified abdominal pain: Secondary | ICD-10-CM | POA: Diagnosis not present

## 2015-10-09 DIAGNOSIS — I129 Hypertensive chronic kidney disease with stage 1 through stage 4 chronic kidney disease, or unspecified chronic kidney disease: Secondary | ICD-10-CM | POA: Insufficient documentation

## 2015-10-09 DIAGNOSIS — I509 Heart failure, unspecified: Secondary | ICD-10-CM | POA: Diagnosis not present

## 2015-10-09 DIAGNOSIS — J45909 Unspecified asthma, uncomplicated: Secondary | ICD-10-CM | POA: Insufficient documentation

## 2015-10-09 DIAGNOSIS — Z9049 Acquired absence of other specified parts of digestive tract: Secondary | ICD-10-CM | POA: Diagnosis not present

## 2015-10-09 DIAGNOSIS — Z79899 Other long term (current) drug therapy: Secondary | ICD-10-CM | POA: Insufficient documentation

## 2015-10-09 DIAGNOSIS — Z7952 Long term (current) use of systemic steroids: Secondary | ICD-10-CM | POA: Diagnosis not present

## 2015-10-09 DIAGNOSIS — D631 Anemia in chronic kidney disease: Secondary | ICD-10-CM | POA: Insufficient documentation

## 2015-10-09 DIAGNOSIS — F17210 Nicotine dependence, cigarettes, uncomplicated: Secondary | ICD-10-CM | POA: Insufficient documentation

## 2015-10-09 DIAGNOSIS — R079 Chest pain, unspecified: Secondary | ICD-10-CM | POA: Diagnosis not present

## 2015-10-09 DIAGNOSIS — N183 Chronic kidney disease, stage 3 (moderate): Secondary | ICD-10-CM | POA: Diagnosis not present

## 2015-10-09 LAB — COMPREHENSIVE METABOLIC PANEL
ALK PHOS: 128 U/L — AB (ref 38–126)
ALT: 19 U/L (ref 14–54)
ANION GAP: 8 (ref 5–15)
AST: 24 U/L (ref 15–41)
Albumin: 3.3 g/dL — ABNORMAL LOW (ref 3.5–5.0)
BILIRUBIN TOTAL: 0.3 mg/dL (ref 0.3–1.2)
BUN: 23 mg/dL — ABNORMAL HIGH (ref 6–20)
CALCIUM: 8.4 mg/dL — AB (ref 8.9–10.3)
CO2: 16 mmol/L — ABNORMAL LOW (ref 22–32)
Chloride: 117 mmol/L — ABNORMAL HIGH (ref 101–111)
Creatinine, Ser: 2.3 mg/dL — ABNORMAL HIGH (ref 0.44–1.00)
GFR, EST AFRICAN AMERICAN: 25 mL/min — AB (ref >60)
GFR, EST NON AFRICAN AMERICAN: 22 mL/min — AB (ref >60)
GLUCOSE: 97 mg/dL (ref 65–99)
Potassium: 3.9 mmol/L (ref 3.5–5.1)
Sodium: 141 mmol/L (ref 135–145)
Total Protein: 7 g/dL (ref 6.5–8.1)

## 2015-10-09 LAB — CBC WITH DIFFERENTIAL/PLATELET
BASOS PCT: 0 %
Basophils Absolute: 0 10*3/uL (ref 0.0–0.1)
EOS ABS: 0.3 10*3/uL (ref 0.0–0.7)
Eosinophils Relative: 3 %
HCT: 26.1 % — ABNORMAL LOW (ref 36.0–46.0)
Hemoglobin: 8.3 g/dL — ABNORMAL LOW (ref 12.0–15.0)
Lymphocytes Relative: 28 %
Lymphs Abs: 2 10*3/uL (ref 0.7–4.0)
MCH: 30.2 pg (ref 26.0–34.0)
MCHC: 31.8 g/dL (ref 30.0–36.0)
MCV: 94.9 fL (ref 78.0–100.0)
Monocytes Absolute: 0.6 10*3/uL (ref 0.1–1.0)
Monocytes Relative: 8 %
NEUTROS ABS: 4.5 10*3/uL (ref 1.7–7.7)
NEUTROS PCT: 61 %
Platelets: 198 10*3/uL (ref 150–400)
RBC: 2.75 MIL/uL — AB (ref 3.87–5.11)
RDW: 15.8 % — ABNORMAL HIGH (ref 11.5–15.5)
WBC: 7.3 10*3/uL (ref 4.0–10.5)

## 2015-10-09 LAB — URINE MICROSCOPIC-ADD ON

## 2015-10-09 LAB — URINALYSIS, ROUTINE W REFLEX MICROSCOPIC
BILIRUBIN URINE: NEGATIVE
Glucose, UA: NEGATIVE mg/dL
HGB URINE DIPSTICK: NEGATIVE
Ketones, ur: NEGATIVE mg/dL
NITRITE: NEGATIVE
PH: 5 (ref 5.0–8.0)
Protein, ur: NEGATIVE mg/dL
SPECIFIC GRAVITY, URINE: 1.007 (ref 1.005–1.030)

## 2015-10-09 LAB — I-STAT TROPONIN, ED
TROPONIN I, POC: 0 ng/mL (ref 0.00–0.08)
Troponin i, poc: 0 ng/mL (ref 0.00–0.08)

## 2015-10-09 LAB — LIPASE, BLOOD: LIPASE: 215 U/L — AB (ref 11–51)

## 2015-10-09 MED ORDER — ACETAMINOPHEN 325 MG PO TABS
650.0000 mg | ORAL_TABLET | Freq: Once | ORAL | Status: AC
  Administered 2015-10-09: 650 mg via ORAL
  Filled 2015-10-09: qty 2

## 2015-10-09 NOTE — ED Notes (Signed)
Pt arrives from home via [facility] c/o left flank pain that began about 30 minutes ago. Pt describes pain as gas. Rates pain 8/10 on pain scale.

## 2015-10-09 NOTE — ED Provider Notes (Signed)
CSN: [id number]     Arrival date & time 10/09/15  1357 History   First MD Initiated Contact with Patient 10/09/15 1408     Chief Complaint  Patient presents with  . Flank Pain   HPI   [name] [name] is an 65 y.o. female with history of HTN, HLD, CHF, CKD, crohn's (s/p diverting colostomy and ileostomy) who presents to the ED for evaluation of left sided chest pain and flank pain. She states she was resting at home when she started experiencing sudden onset 8/10 sharp pain near the lateral edge of her left chest that radiates down to her flank. She states the pain is constant but is better when she lays on her right side. She states that she took a lorazepam at home which provided moderately relief. Denies SOB or diaphoresis. Denies n/v/d. Denies dysuria, urinary frequency/urgency, gross hematuria, or fever/chills. Pt states she is anaphylactic to most pain meds but can take tylenol.   Past Medical History  Diagnosis Date  . Crohn's disease ([place])   . Hypertension   . Hyperlipidemia   . CHF (congestive heart failure) ([facility])     EF 30-35% 2012->EF 60-65% 2013  . GERD (gastroesophageal reflux disease)   . 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 Anxiety   . Panic attacks   . CKD (chronic kidney disease), stage III   . Anemia, chronic renal failure   . Asthma 05/2011  . Headache(784.0)     "related to high BP" (05/30/2012)  . Tobacco abuse   . Abnormal chest CT     Coronary atherosclerosis on chest CT 2012   Past Surgical History  Procedure Laterality Date  . 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 Ectopic pregnancy surgery  ?1980's    left  . Cholecystectomy  01/28/2005  . Cataract extraction w/ intraocular lens  implant, bilateral  ~ 2000  . Ileostomy  ?  2002   Family History  Problem Relation Age of Onset  . Heart disease Mother   . Stroke Father    Social History  Substance Use Topics  . Smoking status: Current Every Day Smoker -- 0.12 packs/day for 18 years    Types: Cigarettes  .  Smokeless tobacco: Never Used     Comment: USES E CIGARETTE  . Alcohol Use: No     Comment: 05/30/2012 "used to drink back in the day; last alcohol 23 yr ago"   OB History    Gravida Para Term Preterm AB TAB SAB Ectopic Multiple Living   2 1 1  0 1 0 0 1 0 1     Review of Systems  All other systems reviewed and are negative.     Allergies  Amoxicillin; Aspirin; Morphine and related; Penicillins; Gabapentin; Ciprofloxacin; and Ace inhibitors  Home Medications   Prior to Admission medications   Medication Sig Start Date End Date Taking? Authorizing Provider  acetaminophen (TYLENOL) 500 MG tablet Take 1,000 mg by mouth every 6 (six) hours as needed for pain.    Historical Provider, MD  ASACOL HD 800 MG TBEC TAKE 1 TABLET BY MOUTH 3 TIMES DAILY 01/21/15   Zenia Resides, MD  calcium citrate (CALCITRATE - DOSED IN MG ELEMENTAL CALCIUM) 950 MG tablet Take 1 tablet (200 mg of elemental calcium total) by mouth 2 (two) times daily. 05/20/13   [place], MD  cetirizine (ZYRTEC) 10 MG tablet Take 1 tablet (10 mg total) by mouth daily. For allergies/sinus problems 11/21/14  Zenia Resides, MD  citalopram (CELEXA) 20 MG tablet Take 1 tablet (20 mg total) by mouth daily. 09/05/15   Zenia Resides, MD  clopidogrel (PLAVIX) 75 MG tablet take 1 tablet by mouth once daily WITH BREAKFAST 05/20/15   Zenia Resides, MD  ketoconazole (NIZORAL) 2 % cream Apply 1 application topically 2 (two) times daily. 01/09/15   Leone Brand, MD  LORazepam (ATIVAN) 0.5 MG tablet take 1 tablet by mouth twice a day for anxiety 07/04/15   Zenia Resides, MD  magnesium oxide (MAG-OX) 400 MG tablet take 1 tablet by mouth once daily 06/11/14   Zenia Resides, MD  Multiple Vitamin (MULTIVITAMIN WITH MINERALS) TABS tablet Take 1 tablet by mouth 2 (two) times daily.    Historical Provider, MD  simvastatin (ZOCOR) 20 MG tablet Take 1 tablet (20 mg total) by mouth at bedtime. 09/05/15   Zenia Resides, MD    triamcinolone cream (KENALOG) 0.1 % Apply 1 application topically 2 (two) times daily. To hands 05/04/14   Zenia Resides, MD   BP 118/64 mmHg  Pulse 101  Temp(Src) 98.5 F (36.9 C) (Oral)  Resp 18  SpO2 100% Physical Exam  Constitutional: She is oriented to person, place, and time.  HENT:  Right Ear: External ear normal.  Left Ear: External ear normal.  Nose: Nose normal.  Mouth/Throat: Oropharynx is clear and moist. No oropharyngeal exudate.  Eyes: Conjunctivae and EOM are normal. Pupils are equal, round, and reactive to light.  Neck: Normal range of motion. Neck supple.  Cardiovascular: Regular rhythm, normal heart sounds and intact distal pulses.  Tachycardia present.   Pulmonary/Chest: Effort normal and breath sounds normal. No respiratory distress. She has no wheezes. She exhibits tenderness.    Lateral left chest tenderness  Abdominal: Soft. Bowel sounds are normal. She exhibits no distension. There is no tenderness. There is no rebound and no guarding.  No CVA tenderness  Musculoskeletal: She exhibits no edema.  No lower extremity edema  Neurological: She is alert and oriented to person, place, and time. No cranial nerve deficit.  Skin: Skin is warm and dry.  Psychiatric: She has a normal mood and affect.  Nursing note and vitals reviewed.   ED Course  Procedures (including critical care time) Labs Review Labs Reviewed  URINALYSIS, ROUTINE W REFLEX MICROSCOPIC (NOT AT [facility]) - Abnormal; Notable for the following:    APPearance HAZY (*)    Leukocytes, UA MODERATE (*)    All other components within normal limits  COMPREHENSIVE METABOLIC PANEL - Abnormal; Notable for the following:    Chloride 117 (*)    CO2 16 (*)    BUN 23 (*)    Creatinine, Ser 2.30 (*)    Calcium 8.4 (*)    Albumin 3.3 (*)    Alkaline Phosphatase 128 (*)    GFR calc non Af Amer 22 (*)    GFR calc Af Amer 25 (*)    All other components within normal limits  LIPASE, BLOOD - Abnormal;  Notable for the following:    Lipase 215 (*)    All other components within normal limits  CBC WITH DIFFERENTIAL/PLATELET - Abnormal; Notable for the following:    RBC 2.75 (*)    Hemoglobin 8.3 (*)    HCT 26.1 (*)    RDW 15.8 (*)    All other components within normal limits  URINE MICROSCOPIC-ADD ON - Abnormal; Notable for the following:    Squamous Epithelial /  LPF 6-30 (*)    Bacteria, UA MANY (*)    Casts GRANULAR CAST (*)    All other components within normal limits  URINE CULTURE  Randolm Idol, ED    Imaging Review Dg Chest 2 View  10/09/2015  CLINICAL DATA:  65 year old female with chest pain. Smoker. Initial encounter. EXAM: CHEST  2 VIEW COMPARISON:  04/29/2014 and earlier. FINDINGS: Stable lung volumes. Normal cardiac size and mediastinal contours. Visualized tracheal air column is within normal limits. Stable cholecystectomy clips. Chronic increased interstitial markings in both lungs are stable. No pneumothorax, pulmonary edema, pleural effusion or superimposed acute pulmonary opacity. Osteopenia. No acute osseous abnormality identified. Calcified atherosclerosis of the abdominal aorta. IMPRESSION: No acute cardiopulmonary abnormality. Electronically Signed   By: Genevie Ann [name]   On: 10/09/2015 14:51   I have personally reviewed and evaluated these images and lab results as part of my medical decision-making.   EKG Interpretation None      MDM   Final diagnoses:  Left-sided chest wall pain  Left flank pain  Renal insufficiency  Anemia of chronic disease    Labs with cr of 2.3 (baseline in the past appears to be 1.5-2.1), lipase of 215. hgb of 8.3 which is slightly lower than baseline. Urine does show many bacteria and 6-30 WBC per hpf. However, there is also evidence of contamination. Pt denies abdominal or urinary complaints at this time. Will hold off on abx therapy and send urine for culture.   Her pain is improved with tylenol. She does have a little bit  increased renal insufficiency and worsening anemia of chronic disease. She has not see family practice in clinic in over a year. HEART score is 3. Tachycardia is at baseline. I spoke to family medicine to touch base as I do not think pt necessarily needs admission just for AKI, but given lack of recent outpatient f/u will discuss.  I spoke with family med who agrees since pt otherwise asymptomatic without urinary complaints, eating and drinking okay, they feel pt does not need admission at this time. I will obtain delta troponin and if negative will d/c home with instructions for outpatient f/u.  Delta trop negative. PT continues to rest comfortably. VSS and pt nontoxic appearing. Instructions to f/u at [facility] this week for management of chronic conditions. ER return precautions given.   Anne Ng, PA-C 10/10/15 [place], MD 10/10/15 [phone]

## 2015-10-09 NOTE — Discharge Instructions (Signed)
You were seen in the emergency room today for evaluation of pain in your left side. Your workup was not concerning for any emergent abnormality. Your kidney function and anemia seem to be a little bit worse than they had been over the past couple of years. Please call [facility] to schedule a follow up appointment as soon as possible so they can re-check your labs and manage as necessary. Return to the emergency room for new or worsening symptoms.

## 2015-10-10 LAB — URINE CULTURE

## 2015-10-11 ENCOUNTER — Other Ambulatory Visit: Payer: Self-pay | Admitting: Family Medicine

## 2015-10-11 DIAGNOSIS — Z1231 Encounter for screening mammogram for malignant neoplasm of breast: Secondary | ICD-10-CM

## 2015-10-17 ENCOUNTER — Ambulatory Visit (HOSPITAL_BASED_OUTPATIENT_CLINIC_OR_DEPARTMENT_OTHER): Payer: Medicare Other

## 2015-11-29 DIAGNOSIS — Z933 Colostomy status: Secondary | ICD-10-CM | POA: Diagnosis not present

## 2015-11-29 DIAGNOSIS — K5090 Crohn's disease, unspecified, without complications: Secondary | ICD-10-CM | POA: Diagnosis not present

## 2016-01-06 ENCOUNTER — Other Ambulatory Visit: Payer: Self-pay | Admitting: *Deleted

## 2016-01-15 ENCOUNTER — Ambulatory Visit: Payer: Medicare Other | Admitting: Family Medicine

## 2016-02-05 ENCOUNTER — Other Ambulatory Visit: Payer: Self-pay | Admitting: Family Medicine

## 2016-02-28 DIAGNOSIS — Z933 Colostomy status: Secondary | ICD-10-CM | POA: Diagnosis not present

## 2016-02-28 DIAGNOSIS — K5090 Crohn's disease, unspecified, without complications: Secondary | ICD-10-CM | POA: Diagnosis not present

## 2016-03-12 ENCOUNTER — Ambulatory Visit: Payer: Medicare Other

## 2016-03-19 ENCOUNTER — Other Ambulatory Visit (HOSPITAL_COMMUNITY)
Admission: RE | Admit: 2016-03-19 | Discharge: 2016-03-19 | Disposition: A | Discharge status: Home / Self Care | Payer: Medicare Other | Source: Ambulatory Visit | Attending: Family Medicine | Admitting: Family Medicine

## 2016-03-19 ENCOUNTER — Encounter: Payer: Self-pay | Admitting: Family Medicine

## 2016-03-19 ENCOUNTER — Ambulatory Visit (INDEPENDENT_AMBULATORY_CARE_PROVIDER_SITE_OTHER): Payer: Medicare Other | Admitting: Family Medicine

## 2016-03-19 VITALS — BP 126/59 | HR 81 | Temp 98.6°F | Ht 66.0 in | Wt 127.0 lb

## 2016-03-19 DIAGNOSIS — Z124 Encounter for screening for malignant neoplasm of cervix: Secondary | ICD-10-CM | POA: Diagnosis not present

## 2016-03-19 DIAGNOSIS — K50013 Crohn's disease of small intestine with fistula: Secondary | ICD-10-CM

## 2016-03-19 DIAGNOSIS — Z1151 Encounter for screening for human papillomavirus (HPV): Secondary | ICD-10-CM | POA: Insufficient documentation

## 2016-03-19 DIAGNOSIS — Z87898 Personal history of other specified conditions: Secondary | ICD-10-CM

## 2016-03-19 DIAGNOSIS — R87610 Atypical squamous cells of undetermined significance on cytologic smear of cervix (ASC-US): Secondary | ICD-10-CM

## 2016-03-19 DIAGNOSIS — E7800 Pure hypercholesterolemia, unspecified: Secondary | ICD-10-CM

## 2016-03-19 DIAGNOSIS — Z01419 Encounter for gynecological examination (general) (routine) without abnormal findings: Secondary | ICD-10-CM | POA: Insufficient documentation

## 2016-03-19 DIAGNOSIS — Z8669 Personal history of other diseases of the nervous system and sense organs: Secondary | ICD-10-CM

## 2016-03-19 DIAGNOSIS — Z23 Encounter for immunization: Secondary | ICD-10-CM

## 2016-03-19 DIAGNOSIS — Z1159 Encounter for screening for other viral diseases: Secondary | ICD-10-CM | POA: Diagnosis not present

## 2016-03-19 NOTE — Progress Notes (Signed)
   Subjective:    Patient ID: [name] [name], female    DOB: 23-Sep-1950, 65 y.o.   MRN: [id number]  HPI first visit with me in over one year.  Came in for a recheck of multiple problems.   1. Start with most worrisome.  Had abnormal pap in 2014 and was supposed to have follow up in 6 months.  She has avoided because she hates pap smears.  Even though not on her agenda today, I insisted. 2. Crohns has been stable on asachol, off prednisone for several years. 3. Social, lost her section 8 housing because she allowed a niece to live with her.  Now has housing insecurity, currently staying with a cousin.  Also sister died in last 62 months. 4. Seizure.  No recent seizures, still a one time thing.   5. CKD stage three.  Not on any nephrotoxic drugs.  Denies edema. 6. HPDP, needs mammo, pap flu and hep c screen.    Review of Systems     Objective:   Physical ExamLungs clear Cardiac RRR without m or g Abd benign Vulva distorted anatomy due to old crohns fisulas.  Best view of cervix showed a smooth, non bleeding lump.  By visual, it looks like a nabothean cyst but the hx of abnormal pap makes me very worried.          Assessment & Plan:

## 2016-03-19 NOTE — Patient Instructions (Signed)
You will get a flu shot today Make sure you get your mammogram I will call with your lab test and pap smear results.   I am sorry about your family turmoil.  You are now doing the right thing to take care of yourself.

## 2016-03-19 NOTE — Assessment & Plan Note (Addendum)
Very worried given abnormal cervix on exam and three year gap since last pap.  Await pap.  Normal PAP!!!!  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

## 2016-03-19 NOTE — Assessment & Plan Note (Signed)
Stable, cont asachol

## 2016-03-19 NOTE — Assessment & Plan Note (Signed)
Check labs 

## 2016-03-19 NOTE — Assessment & Plan Note (Signed)
Stable off all seizure meds.

## 2016-03-19 NOTE — Assessment & Plan Note (Signed)
She has not been taking mag.  Check labs.

## 2016-03-20 ENCOUNTER — Ambulatory Visit: Payer: Medicare Other

## 2016-03-20 ENCOUNTER — Ambulatory Visit
Admission: RE | Admit: 2016-03-20 | Discharge: 2016-03-20 | Disposition: A | Discharge status: Home / Self Care | Payer: Medicare Other | Source: Ambulatory Visit | Attending: Family Medicine | Admitting: Family Medicine

## 2016-03-20 DIAGNOSIS — Z1231 Encounter for screening mammogram for malignant neoplasm of breast: Secondary | ICD-10-CM | POA: Diagnosis not present

## 2016-03-20 LAB — LIPID PANEL
Cholesterol: 168 mg/dL (ref 125–200)
HDL: 43 mg/dL — AB (ref >=46)
LDL Cholesterol: 89 mg/dL (ref <130)
TRIGLYCERIDES: 179 mg/dL — AB (ref <150)
Total CHOL/HDL Ratio: 3.9 Ratio (ref <=5.0)
VLDL: 36 mg/dL — ABNORMAL HIGH (ref <30)

## 2016-03-20 LAB — HEPATITIS C ANTIBODY: HCV Ab: NEGATIVE

## 2016-03-20 LAB — MAGNESIUM: Magnesium: 1.8 mg/dL (ref 1.5–2.5)

## 2016-03-23 LAB — CYTOLOGY - PAP

## 2016-04-08 DIAGNOSIS — Z933 Colostomy status: Secondary | ICD-10-CM | POA: Diagnosis not present

## 2016-04-08 DIAGNOSIS — K5090 Crohn's disease, unspecified, without complications: Secondary | ICD-10-CM | POA: Diagnosis not present

## 2016-05-07 ENCOUNTER — Ambulatory Visit: Payer: Medicare Other | Admitting: Family Medicine

## 2016-05-29 DIAGNOSIS — Z933 Colostomy status: Secondary | ICD-10-CM | POA: Diagnosis not present

## 2016-05-29 DIAGNOSIS — K5090 Crohn's disease, unspecified, without complications: Secondary | ICD-10-CM | POA: Diagnosis not present

## 2016-06-17 ENCOUNTER — Encounter: Payer: Self-pay | Admitting: Family Medicine

## 2016-06-17 ENCOUNTER — Ambulatory Visit (INDEPENDENT_AMBULATORY_CARE_PROVIDER_SITE_OTHER): Payer: Medicare Other | Admitting: Family Medicine

## 2016-06-17 DIAGNOSIS — L259 Unspecified contact dermatitis, unspecified cause: Secondary | ICD-10-CM | POA: Insufficient documentation

## 2016-06-17 DIAGNOSIS — L231 Allergic contact dermatitis due to adhesives: Secondary | ICD-10-CM

## 2016-06-17 DIAGNOSIS — H60312 Diffuse otitis externa, left ear: Secondary | ICD-10-CM

## 2016-06-17 DIAGNOSIS — H60502 Unspecified acute noninfective otitis externa, left ear: Secondary | ICD-10-CM | POA: Insufficient documentation

## 2016-06-17 MED ORDER — NEOMYCIN-POLYMYXIN-HC 3.5-10000-1 OT SOLN: 3.0000 [drp] | OTIC | 0 refills | Status: DC

## 2016-06-17 MED ORDER — TRIAMCINOLONE ACETONIDE 0.1 % EX OINT: 1.0000 "application " | TOPICAL_OINTMENT | Freq: Two times a day (BID) | CUTANEOUS | 0 refills | Status: DC

## 2016-06-17 MED ORDER — SULFAMETHOXAZOLE-TRIMETHOPRIM 800-160 MG PO TABS
1.0000 | ORAL_TABLET | Freq: Two times a day (BID) | ORAL | 0 refills | Status: DC
Start: 2016-06-17 — End: 2016-11-11

## 2016-06-17 NOTE — Patient Instructions (Signed)
The most important medicines are the two for your ear infection.  The ointment for the skin can wait. To save money, try using Selsun shampoo over the counter daily on your scalp.

## 2016-06-17 NOTE — Assessment & Plan Note (Signed)
Around stoma

## 2016-06-17 NOTE — Progress Notes (Signed)
   Subjective:    Patient ID: [name] [name], female    DOB: July 21, 1950, 65 y.o.   MRN: [id number]  HPI  Three issues: Has URI for ~1 week improving except worsening left ear pain.  Also fullness, ringing and decreased hearing. C/O scalp scale and irritation.  Not using any meds.  Not shampoing regularly "It is too cold to shampoo every day." C/O irritation around stoma.  Wonders if it is connected to scalp problem.  Also financial concerns.  Doesn't know if she can afford medications.      Review of Systems     Objective:   Physical Exam Scalp, No descrete lesions.  Dry and thick scale throughout.  Woods lamp neg. Left ear. Pain on manipulation of pinna.  Red, edematous canal with purulent debri.  TM is purulent - can't tell if all external or also otitis media. Abd Dry scaly skin around stoma.  KOH neg        Assessment & Plan:

## 2016-06-17 NOTE — Assessment & Plan Note (Signed)
May also have otitis media

## 2016-08-07 DIAGNOSIS — K5090 Crohn's disease, unspecified, without complications: Secondary | ICD-10-CM | POA: Diagnosis not present

## 2016-08-07 DIAGNOSIS — Z933 Colostomy status: Secondary | ICD-10-CM | POA: Diagnosis not present

## 2016-08-11 DIAGNOSIS — Z933 Colostomy status: Secondary | ICD-10-CM | POA: Diagnosis not present

## 2016-08-11 DIAGNOSIS — K5090 Crohn's disease, unspecified, without complications: Secondary | ICD-10-CM | POA: Diagnosis not present

## 2016-08-28 ENCOUNTER — Telehealth: Payer: Self-pay | Admitting: Family Medicine

## 2016-09-03 ENCOUNTER — Other Ambulatory Visit: Payer: Self-pay | Admitting: Family Medicine

## 2016-09-09 ENCOUNTER — Encounter: Payer: Self-pay | Admitting: Family Medicine

## 2016-09-09 ENCOUNTER — Ambulatory Visit (INDEPENDENT_AMBULATORY_CARE_PROVIDER_SITE_OTHER): Payer: Medicare Other | Admitting: Family Medicine

## 2016-09-09 DIAGNOSIS — J111 Influenza due to unidentified influenza virus with other respiratory manifestations: Secondary | ICD-10-CM

## 2016-09-09 DIAGNOSIS — R69 Illness, unspecified: Secondary | ICD-10-CM

## 2016-09-09 NOTE — Patient Instructions (Signed)
I believe you have the flu.  Unfortunately, the flu shot wasn't very good this year. You should slowly get better from here.  If you get worse at any time, call me.  I would send in antibiotics for pneumonia. OK to take mucinex or any over the counter medicine that makes you feel better.  You do not need to take any medicine.

## 2016-09-09 NOTE — Assessment & Plan Note (Signed)
Past the 48 hour window for tamiflu.  Will observe.  Antibiotics only if worsening.  Educated about second sickening.

## 2016-09-09 NOTE — Progress Notes (Signed)
   Subjective:    Patient ID: [name] [name], female    DOB: 06-Jan-1951, 66 y.o.   MRN: [id number]  HPI C/O acute illness 4-5 days.  Low grade fever, head congestion, dry cough, feels sick.  Some myalgias.  Did get flu shot.  No obvious infectious exposure.  Still with local epidemic flu    Review of Systems     Objective:   Physical Exam  VS noted Gen non toxic and no resp distress HEENT, TMs nl. Throat mild injection. No sig cervical lymphadenopathy.   Lungs clear Cardiac RRR without m or g       Assessment & Plan:

## 2016-10-03 ENCOUNTER — Other Ambulatory Visit: Payer: Self-pay | Admitting: Family Medicine

## 2016-10-09 DIAGNOSIS — Z933 Colostomy status: Secondary | ICD-10-CM | POA: Diagnosis not present

## 2016-10-09 DIAGNOSIS — K5090 Crohn's disease, unspecified, without complications: Secondary | ICD-10-CM | POA: Diagnosis not present

## 2016-10-23 ENCOUNTER — Telehealth: Payer: Self-pay | Admitting: Family Medicine

## 2016-10-23 NOTE — Telephone Encounter (Signed)
She has questions about the cream she uses around her ostamy.  Thank you

## 2016-10-26 NOTE — Telephone Encounter (Signed)
LVM for pt to call back to inquire about below. Zimmerman Rumple, April D, CMA  

## 2016-11-10 NOTE — Telephone Encounter (Signed)
Pt has appointment with PCP tomorrow. [name] [name], [name] [name] D, [place]

## 2016-11-11 ENCOUNTER — Encounter: Payer: Self-pay | Admitting: Family Medicine

## 2016-11-11 ENCOUNTER — Ambulatory Visit (INDEPENDENT_AMBULATORY_CARE_PROVIDER_SITE_OTHER): Payer: Medicare Other | Admitting: Family Medicine

## 2016-11-11 DIAGNOSIS — N183 Chronic kidney disease, stage 3 unspecified: Secondary | ICD-10-CM

## 2016-11-11 DIAGNOSIS — L231 Allergic contact dermatitis due to adhesives: Secondary | ICD-10-CM

## 2016-11-11 DIAGNOSIS — I10 Essential (primary) hypertension: Secondary | ICD-10-CM

## 2016-11-11 DIAGNOSIS — F419 Anxiety disorder, unspecified: Secondary | ICD-10-CM | POA: Diagnosis not present

## 2016-11-11 DIAGNOSIS — K50013 Crohn's disease of small intestine with fistula: Secondary | ICD-10-CM | POA: Diagnosis not present

## 2016-11-11 DIAGNOSIS — E7800 Pure hypercholesterolemia, unspecified: Secondary | ICD-10-CM | POA: Diagnosis not present

## 2016-11-11 MED ORDER — TRIAMCINOLONE ACETONIDE 0.1 % EX OINT: 1.0000 "application " | TOPICAL_OINTMENT | Freq: Two times a day (BID) | CUTANEOUS | 6 refills | Status: DC

## 2016-11-11 MED ORDER — MESALAMINE 800 MG PO TBEC: 1.0000 | DELAYED_RELEASE_TABLET | Freq: Three times a day (TID) | ORAL | 3 refills | Status: DC

## 2016-11-11 NOTE — Assessment & Plan Note (Signed)
Refill kenalog to rx stoma irritation.

## 2016-11-11 NOTE — Assessment & Plan Note (Signed)
Continue current meds 

## 2016-11-11 NOTE — Progress Notes (Signed)
   Subjective:    Patient ID: [name] [name], female    DOB: [birth year], 66 y.o.   MRN: [id number]  HPI  Here for recheck multiple problems and refill of meds. Crohns disease.  Stable on mesalamine.  No recent GI visit.  Due for colonoscopy survelence.   Contact dermatitis around stoma.  Needs refill on kenalog, which treats well.  High cholesterol.  No recent labs. Anxiety. Chronic Reasonably controled on clexa and lorazepam   Review of Systems     Objective:   Physical Exam VS noted Lungs clear Cardiac RRR without m or g Abd benign.        Assessment & Plan:

## 2016-11-11 NOTE — Assessment & Plan Note (Signed)
Due for lab check.

## 2016-11-11 NOTE — Assessment & Plan Note (Addendum)
Refill asachol.  Reconsult GI for opinion on colon cancer screen.

## 2016-11-11 NOTE — Patient Instructions (Addendum)
You are looking good.   No changes in your medications. I think it would be good for you to see Dr. Amedeo Plenty again.  Just to touch base even though you are doing well.  I put in the order and someone should call with an appointment. I will call you with blood test results.   See me in 6 months if things continue to go well.

## 2016-11-11 NOTE — Assessment & Plan Note (Signed)
Now normotensive off meds with her weight loss.

## 2016-11-12 LAB — CMP14+EGFR
ALK PHOS: 119 IU/L — AB (ref 39–117)
ALT: 11 IU/L (ref 0–32)
AST: 18 IU/L (ref 0–40)
Albumin/Globulin Ratio: 1.3 (ref 1.2–2.2)
Albumin: 4.6 g/dL (ref 3.6–4.8)
BUN/Creatinine Ratio: 12 (ref 12–28)
BUN: 34 mg/dL — ABNORMAL HIGH (ref 8–27)
Bilirubin Total: 0.2 mg/dL (ref 0.0–1.2)
CO2: 9 mmol/L — AB (ref 18–29)
CREATININE: 2.87 mg/dL — AB (ref 0.57–1.00)
Calcium: 9.5 mg/dL (ref 8.7–10.3)
Chloride: 107 mmol/L — ABNORMAL HIGH (ref 96–106)
GFR calc Af Amer: 19 mL/min/{1.73_m2} — ABNORMAL LOW (ref >59)
GFR, EST NON AFRICAN AMERICAN: 17 mL/min/{1.73_m2} — AB (ref >59)
GLOBULIN, TOTAL: 3.6 g/dL (ref 1.5–4.5)
Glucose: 95 mg/dL (ref 65–99)
POTASSIUM: 4.7 mmol/L (ref 3.5–5.2)
Sodium: 138 mmol/L (ref 134–144)
TOTAL PROTEIN: 8.2 g/dL (ref 6.0–8.5)

## 2016-11-12 LAB — CBC
HEMATOCRIT: 29.7 % — AB (ref 34.0–46.6)
HEMOGLOBIN: 9.9 g/dL — AB (ref 11.1–15.9)
MCH: 30.8 pg (ref 26.6–33.0)
MCHC: 33.3 g/dL (ref 31.5–35.7)
MCV: 93 fL (ref 79–97)
Platelets: 202 10*3/uL (ref 150–379)
RBC: 3.21 x10E6/uL — ABNORMAL LOW (ref 3.77–5.28)
RDW: 15.1 % (ref 12.3–15.4)
WBC: 6.6 10*3/uL (ref 3.4–10.8)

## 2016-11-12 NOTE — Addendum Note (Signed)
Addended by: Zenia Resides on: 11/12/2016 10:01 AM   Modules accepted: Orders

## 2016-11-12 NOTE — Assessment & Plan Note (Signed)
CKD progressive.  Creat now. 2.87.  Has hx of left renal artery stenosis.  Not diabetic and BP controled.  Will refer to renal.  May need renal artery angioplasty/stenting.

## 2016-11-20 DIAGNOSIS — K50118 Crohn's disease of large intestine with other complication: Secondary | ICD-10-CM | POA: Diagnosis not present

## 2016-12-02 DIAGNOSIS — N189 Chronic kidney disease, unspecified: Secondary | ICD-10-CM | POA: Diagnosis not present

## 2016-12-02 DIAGNOSIS — E872 Acidosis: Secondary | ICD-10-CM | POA: Diagnosis not present

## 2016-12-02 DIAGNOSIS — N179 Acute kidney failure, unspecified: Secondary | ICD-10-CM | POA: Diagnosis not present

## 2016-12-02 DIAGNOSIS — I2510 Atherosclerotic heart disease of native coronary artery without angina pectoris: Secondary | ICD-10-CM | POA: Diagnosis not present

## 2016-12-02 DIAGNOSIS — Z720 Tobacco use: Secondary | ICD-10-CM | POA: Diagnosis not present

## 2016-12-05 ENCOUNTER — Other Ambulatory Visit: Payer: Self-pay | Admitting: Family Medicine

## 2016-12-11 DIAGNOSIS — K5090 Crohn's disease, unspecified, without complications: Secondary | ICD-10-CM | POA: Diagnosis not present

## 2016-12-11 DIAGNOSIS — Z933 Colostomy status: Secondary | ICD-10-CM | POA: Diagnosis not present

## 2017-03-02 DIAGNOSIS — K5090 Crohn's disease, unspecified, without complications: Secondary | ICD-10-CM | POA: Diagnosis not present

## 2017-03-02 DIAGNOSIS — Z933 Colostomy status: Secondary | ICD-10-CM | POA: Diagnosis not present

## 2017-04-12 ENCOUNTER — Emergency Department (HOSPITAL_COMMUNITY)
Admission: EM | Admit: 2017-04-12 | Discharge: 2017-04-13 | Disposition: A | Discharge status: Home / Self Care | Payer: Medicare Other | Attending: Emergency Medicine | Admitting: Emergency Medicine

## 2017-04-12 ENCOUNTER — Emergency Department (HOSPITAL_COMMUNITY): Payer: Medicare Other

## 2017-04-12 ENCOUNTER — Encounter (HOSPITAL_COMMUNITY): Payer: Self-pay

## 2017-04-12 DIAGNOSIS — F17210 Nicotine dependence, cigarettes, uncomplicated: Secondary | ICD-10-CM | POA: Insufficient documentation

## 2017-04-12 DIAGNOSIS — J45909 Unspecified asthma, uncomplicated: Secondary | ICD-10-CM | POA: Insufficient documentation

## 2017-04-12 DIAGNOSIS — R202 Paresthesia of skin: Secondary | ICD-10-CM | POA: Insufficient documentation

## 2017-04-12 DIAGNOSIS — R9431 Abnormal electrocardiogram [ECG] [EKG]: Secondary | ICD-10-CM | POA: Diagnosis not present

## 2017-04-12 DIAGNOSIS — J449 Chronic obstructive pulmonary disease, unspecified: Secondary | ICD-10-CM | POA: Insufficient documentation

## 2017-04-12 DIAGNOSIS — I130 Hypertensive heart and chronic kidney disease with heart failure and stage 1 through stage 4 chronic kidney disease, or unspecified chronic kidney disease: Secondary | ICD-10-CM | POA: Insufficient documentation

## 2017-04-12 DIAGNOSIS — N183 Chronic kidney disease, stage 3 (moderate): Secondary | ICD-10-CM | POA: Insufficient documentation

## 2017-04-12 DIAGNOSIS — I509 Heart failure, unspecified: Secondary | ICD-10-CM | POA: Diagnosis not present

## 2017-04-12 DIAGNOSIS — R200 Anesthesia of skin: Secondary | ICD-10-CM

## 2017-04-12 DIAGNOSIS — J948 Other specified pleural conditions: Secondary | ICD-10-CM | POA: Diagnosis not present

## 2017-04-12 LAB — CBC
HCT: 28 % — ABNORMAL LOW (ref 36.0–46.0)
Hemoglobin: 9 g/dL — ABNORMAL LOW (ref 12.0–15.0)
MCH: 30.2 pg (ref 26.0–34.0)
MCHC: 32.1 g/dL (ref 30.0–36.0)
MCV: 94 fL (ref 78.0–100.0)
PLATELETS: 203 10*3/uL (ref 150–400)
RBC: 2.98 MIL/uL — AB (ref 3.87–5.11)
RDW: 14.7 % (ref 11.5–15.5)
WBC: 6.8 10*3/uL (ref 4.0–10.5)

## 2017-04-12 LAB — BASIC METABOLIC PANEL
Anion gap: 12 (ref 5–15)
BUN: 28 mg/dL — ABNORMAL HIGH (ref 6–20)
CALCIUM: 9.6 mg/dL (ref 8.9–10.3)
CHLORIDE: 110 mmol/L (ref 101–111)
CO2: 13 mmol/L — ABNORMAL LOW (ref 22–32)
CREATININE: 2.57 mg/dL — AB (ref 0.44–1.00)
GFR, EST AFRICAN AMERICAN: 22 mL/min — AB (ref >60)
GFR, EST NON AFRICAN AMERICAN: 19 mL/min — AB (ref >60)
Glucose, Bld: 82 mg/dL (ref 65–99)
Potassium: 3.8 mmol/L (ref 3.5–5.1)
SODIUM: 135 mmol/L (ref 135–145)

## 2017-04-12 LAB — I-STAT TROPONIN, ED: TROPONIN I, POC: 0 ng/mL (ref 0.00–0.08)

## 2017-04-12 MED ORDER — LORAZEPAM 2 MG/ML IJ SOLN
1.0000 mg | Freq: Once | INTRAMUSCULAR | Status: AC
  Administered 2017-04-12: 1 mg via INTRAVENOUS
  Filled 2017-04-12: qty 1

## 2017-04-12 NOTE — ED Provider Notes (Signed)
Emergency Department Provider Note   I have reviewed the triage vital signs and the nursing notes.   HISTORY  Chief Complaint Numbness   HPI [name] [name] is a 66 y.o. female with PMH of CHF, CKD, Crohn's disease, GERD, HLD, and HTN presents to the emergency room in for evaluation of right arm numbness/heaviness with tingling nd throbbing sensation. Symptoms been present for the past 6 days. She reports occasional tingling in her extremities when she gets a Crohn's flare and she thought that may be what's going on. She denies any worsening diarrhea or abdominal pain. No fevers or chills. No chest pain.No pain radiatg from the neck. No injury to the neck or arm. She has no numbness or weakness in the right leg. Denies any vision changes, speech difficulty, difficulty swallowing. Denies any difficulty walking.    Past Medical History:  Diagnosis Date  . Abnormal chest CT    Coronary atherosclerosis on chest CT 2012  . Anemia, chronic renal failure   . Anxiety   . Asthma 05/2011  . CHF (congestive heart failure) (HCC)    EF 30-35% 2012->EF 60-65% 2013  . CKD (chronic kidney disease), stage III ([place])   . Crohn's disease ([place])   . GERD (gastroesophageal reflux disease)   . Headache(784.0)    "related to high BP" (05/30/2012)  . 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 Hyperlipidemia   . Hypertension   . Panic attacks   . Tobacco abuse     Patient Active Problem List   Diagnosis Date Noted  . Contact dermatitis 06/17/2016  . Eczema, dyshidrotic 05/04/2014  . Hypomagnesemia 07/13/2013  . Unspecified vitamin D deficiency 05/20/2013  . CAD (coronary artery disease) 05/30/2012  . Tobacco abuse 05/30/2012  . COPD (chronic obstructive pulmonary disease) ([place]) 05/30/2012  . Anemia in chronic kidney disease 05/30/2012  . Osteopenia 10/05/2011  . Atypical squamous cells of undetermined significance on cytologic smear of cervix (ASC-US) 09/21/2011  . Left renal artery stenosis ([place])  09/04/2011  . Chronic kidney disease (CKD), stage III (moderate) ([place]) 07/30/2011  . History of seizure 03/13/2011  . Hypertension, benign 09/25/2010  . HYPERCHOLESTEROLEMIA 01/10/2007  . Anxiety 09/02/2006  . NEUROPATHY, PERIPHERAL 09/02/2006  . Reflux esophagitis 09/02/2006  . Regional enteritis [facility]) 09/02/2006    Past Surgical History:  Procedure Laterality Date  . CATARACT EXTRACTION W/ INTRAOCULAR LENS  IMPLANT, BILATERAL  ~ 2000  . CHOLECYSTECTOMY  01/28/2005  . COLOSTOMY  03/1996   diverting  . ECTOPIC PREGNANCY SURGERY  ?1980's   left  . ILEOSTOMY  ?  2002    Current Outpatient Rx  . Order #: [id number] Class: Historical Med  . Order #: [id number] Class: Normal  . Order #: [id number] Class: Normal  . Order #: [id number] Class: Historical Med  . Order #: [id number] Class: Normal  . Order #: [id number] Class: Normal  . Order #: [id number] Class: Phone In  . Order #: [id number] Class: Normal    Allergies Amoxicillin; Aspirin; Morphine and related; Penicillins; Gabapentin; Ciprofloxacin; and Ace inhibitors  Family History  Problem Relation Age of Onset  . Heart disease Mother   . Stroke Father     Social History Social History  Substance Use Topics  . Smoking status: Current Every Day Smoker    Packs/day: 0.12    Years: 18.00    Types: Cigarettes  . Smokeless tobacco: Never Used     Comment: USES E CIGARETTE  . Alcohol use No     Comment: 05/30/2012 "used to drink back in the  day; last alcohol 23 yr ago"    Review of Systems  Constitutional: No fever/chills Eyes: No visual changes. ENT: No sore throat. Cardiovascular: Denies chest pain. Respiratory: Denies shortness of breath. Gastrointestinal: No abdominal pain.  No nausea, no vomiting.  No diarrhea.  No constipation. Genitourinary: Negative for dysuria. Musculoskeletal: Negative for back pain. Skin: Negative for rash. Neurological: Negative for headaches. Positive LUE weakness and numbness.   10-point ROS  otherwise negative.  ____________________________________________   PHYSICAL EXAM:  VITAL SIGNS: ED Triage Vitals [04/12/17 1612]  Enc Vitals Group     BP (!) 157/83     Pulse Rate 78     Resp 16     Temp 98.5 F (36.9 C)     Temp Source Oral     SpO2 100 %     Pain Score 7   Constitutional: Alert and oriented. Well appearing and in no acute distress. Eyes: Conjunctivae are normal.  Head: Atraumatic. Nose: No congestion/rhinnorhea. Mouth/Throat: Mucous membranes are moist. Neck: No stridor.  Cardiovascular: Normal rate, regular rhythm. Good peripheral circulation. Grossly normal heart sounds.   Respiratory: Normal respiratory effort.  No retractions. Lungs CTAB. Gastrointestinal: Soft and nontender. Well-appearing, stable ostomy noted. No distention.  Musculoskeletal: No lower extremity tenderness nor edema. No gross deformities of extremities. Neurologic:  Normal speech and language. 4+/5 LUE strength in the LUE (biceps/triceps). Normal RUE and B/L LE strength. Decreased sensation to light touch over the LUE. Normal CN exam 2-12.  Skin:  Skin is warm, dry and intact. No rash noted.  ____________________________________________   LABS (all labs ordered are listed, but only abnormal results are displayed)  Labs Reviewed  BASIC METABOLIC PANEL - Abnormal; Notable for the following:       Result Value   CO2 13 (*)    BUN 28 (*)    Creatinine, Ser 2.57 (*)    GFR calc non Af Amer 19 (*)    GFR calc Af Amer 22 (*)    All other components within normal limits  CBC - Abnormal; Notable for the following:    RBC 2.98 (*)    Hemoglobin 9.0 (*)    HCT 28.0 (*)    All other components within normal limits  I-STAT TROPONIN, ED   ____________________________________________  EKG   EKG Interpretation  Date/Time:  Monday April 12 2017 16:05:42 EDT Ventricular Rate:  88 PR Interval:  148 QRS Duration: 88 QT Interval:  392 QTC Calculation: 474 R Axis:   45 Text  Interpretation:  Sinus rhythm with frequent Premature ventricular complexes Otherwise normal ECG No STEMI.  Confirmed by Nanda Quinton [phone]) on 04/12/2017 11:04:29 PM       ____________________________________________  RADIOLOGY  Dg Chest 2 View  Result Date: 04/12/2017 CLINICAL DATA:  Right arm numbness, tingling and throbbing for 6 days. EXAM: CHEST  2 VIEW COMPARISON:  10/09/2015 FINDINGS: The cardiac silhouette, mediastinal and hilar contours are within normal limits and stable. Chronic emphysematous changes and hyperinflation with basilar scarring changes. No infiltrates or effusions. The bony thorax is intact. IMPRESSION: Stable emphysematous changes and basilar pulmonary scarring. No acute overlying pulmonary process. Electronically Signed   By: Marijo Sanes [name]   On: 04/12/2017 17:08    ____________________________________________   PROCEDURES  Procedure(s) performed:   Procedures  None ____________________________________________   INITIAL IMPRESSION / ASSESSMENT AND PLAN / ED COURSE  Pertinent labs & imaging results that were available during my care of the patient were reviewed by me and considered  in my medical decision making (see chart for details).  Patient presents o the emergency department with right upper extremity tingling, numbness, and heaviness. Patient is outside of any stroke window for intervention. She has multiple risk factors for cerebrovascular disease. She has slight weakness in the right upper extremity in my exam with decreased sensation to light touch.No cranial nerve findings. No lower extremity numbness or weakness. Labs from triage are normal. Plan for MRI with subacute stroke like symptoms. Given symptoms isolated in the arm this could be more of a peripheral nerve issue but symptoms don't seem to be following a peripheral nerve distribution.   MRI ordered and pending. Care transferred to Dr. Leonides Schanz. Anticipate discharge with Neurology follow up  if MRI negative for acute stroke.  ____________________________________________  FINAL CLINICAL IMPRESSION(S) / ED DIAGNOSES  Final diagnoses:  Right arm numbness     MEDICATIONS GIVEN DURING THIS VISIT:  Medications  LORazepam (ATIVAN) injection 1 mg (1 mg Intravenous Given 04/12/17 2336)     NEW OUTPATIENT MEDICATIONS STARTED DURING THIS VISIT:  None  Note:  This document was prepared using Dragon voice recognition software and may include unintentional dictation errors.  Nanda Quinton, MD Emergency Medicine    Matie Dimaano, [facility], MD 04/13/17 [phone]

## 2017-04-12 NOTE — ED Notes (Signed)
Pt transported to MRI 

## 2017-04-12 NOTE — ED Triage Notes (Signed)
PT coming from home complaint of right arm numbness, tingling, and throbbing X6 days. She denies shortness of breath but appears slightly dyspneic. Denies chest pain. Skin warm and dry.

## 2017-04-13 DIAGNOSIS — R202 Paresthesia of skin: Secondary | ICD-10-CM | POA: Diagnosis not present

## 2017-04-13 NOTE — ED Provider Notes (Signed)
1:30 AM  Assumed care from Dr. Laverta Baltimore.  Pt is a 66 y.o. female with history of hypertension, hyperlipidemia, Crohn's disease who presented to the emergency department with 6 days of right arm weakness and decreased sensation. No neck pain or pain in the arm. MRI of the brain was pending for further evaluation. Plan was to disposition patient based on MRI. MRI shows no acute abnormality. Her labs show chronic kidney disease and chronic non-anion gap metabolic acidosis. Otherwise workup unremarkable. Patient has no current complaints at this time. She is comfortable with plan for discharge home. I recommend she follow-up with her primary care physician Dr. Andria Frames and will also give her outpatient neurology follow-up.  Patient comfortable with this plan.   [name] [name], Delice Bison, DO 04/13/17 [phone]

## 2017-04-13 NOTE — Discharge Instructions (Signed)
MRI of your brain was normal.  Please make an appointment for close follow up with your PCP.

## 2017-04-13 NOTE — ED Notes (Signed)
Pt verbalized understanding of d/c instructions and has no further questions. Pt is stable, A&Ox4, VSS.  

## 2017-04-18 ENCOUNTER — Other Ambulatory Visit: Payer: Self-pay | Admitting: Family Medicine

## 2017-06-07 ENCOUNTER — Other Ambulatory Visit: Payer: Self-pay | Admitting: Family Medicine

## 2017-06-07 MED ORDER — LORAZEPAM 0.5 MG PO TABS: ORAL_TABLET | ORAL | 5 refills | Status: DC

## 2017-06-08 DIAGNOSIS — K5090 Crohn's disease, unspecified, without complications: Secondary | ICD-10-CM | POA: Diagnosis not present

## 2017-06-08 DIAGNOSIS — Z933 Colostomy status: Secondary | ICD-10-CM | POA: Diagnosis not present

## 2017-08-18 DIAGNOSIS — K5090 Crohn's disease, unspecified, without complications: Secondary | ICD-10-CM | POA: Diagnosis not present

## 2017-08-18 DIAGNOSIS — Z933 Colostomy status: Secondary | ICD-10-CM | POA: Diagnosis not present

## 2017-09-27 DIAGNOSIS — K5090 Crohn's disease, unspecified, without complications: Secondary | ICD-10-CM | POA: Diagnosis not present

## 2017-09-27 DIAGNOSIS — Z933 Colostomy status: Secondary | ICD-10-CM | POA: Diagnosis not present

## 2017-11-08 ENCOUNTER — Other Ambulatory Visit: Payer: Self-pay

## 2017-11-08 MED ORDER — SIMVASTATIN 20 MG PO TABS: 20.0000 mg | ORAL_TABLET | Freq: Every day | ORAL | 3 refills | Status: DC

## 2017-11-08 NOTE — Telephone Encounter (Signed)
Prior to denying refill on plavix, I saw that the patient reported that she was no longer taking.  Has not seen a cardiologist in years.  Had systolic CHF but never had documented CAD.  No cath.  Reassuring myoview.  She is at high risk of GI bleed due to Crohn's disease.  Will DC plavix.

## 2017-11-24 ENCOUNTER — Other Ambulatory Visit: Payer: Self-pay

## 2017-11-24 ENCOUNTER — Encounter: Payer: Self-pay | Admitting: Family Medicine

## 2017-11-24 ENCOUNTER — Ambulatory Visit (INDEPENDENT_AMBULATORY_CARE_PROVIDER_SITE_OTHER): Payer: Medicare Other | Admitting: Family Medicine

## 2017-11-24 DIAGNOSIS — G5601 Carpal tunnel syndrome, right upper limb: Secondary | ICD-10-CM

## 2017-11-24 DIAGNOSIS — N183 Chronic kidney disease, stage 3 unspecified: Secondary | ICD-10-CM

## 2017-11-24 DIAGNOSIS — K50013 Crohn's disease of small intestine with fistula: Secondary | ICD-10-CM | POA: Diagnosis not present

## 2017-11-24 DIAGNOSIS — I2510 Atherosclerotic heart disease of native coronary artery without angina pectoris: Secondary | ICD-10-CM | POA: Diagnosis not present

## 2017-11-24 MED ORDER — SIMVASTATIN 20 MG PO TABS: 20.0000 mg | ORAL_TABLET | Freq: Every day | ORAL | 3 refills | Status: DC

## 2017-11-24 MED ORDER — CLOPIDOGREL BISULFATE 75 MG PO TABS: 75.0000 mg | ORAL_TABLET | Freq: Every day | ORAL | 3 refills | Status: DC

## 2017-11-24 NOTE — Patient Instructions (Addendum)
It was great seeing you today! It is very likely that your hand tingling and numbness is caused by carpal tunnel syndrome. We sent in a referral to a hand surgeon since this has been going on for several months and you are having some hand weakness. - Until you see the hand doctor, you can try using a carpal tunnel wrist splint that you can order or buy at [facility] or any other convenience store. You can wear this at night and during the day when you're not using your right hand.  Please also set up an appointment with a GI doctor to follow up on your Crohn's disease and schedule a colonoscopy.  We refilled your Plavix and simvastatin today - You should be able to pick these up at your pharmacy.

## 2017-11-24 NOTE — Assessment & Plan Note (Addendum)
Given history of Crohn's disease with ileostomy (2002), recommended re-establishing care with gastroenterologist. - Referral placed to [facility] - Checking CBC and BMP today

## 2017-11-24 NOTE — Progress Notes (Signed)
Date of Visit: 11/24/2017   HPI:  Ms. Goodell is a 67 yo woman here today to discuss the following:  Tingling in right hand - Started on October 2018; tingling has remained unchanged since that time - Sleeps on right side, including on right arm, but otherwise does not recall any injury or trauma to area - Entire palmar surface of right hand affected; denies tingling of dorsal hand - Describes "catching" sensation of right thumb that radiates pain up lateral forearm when she repeatedly uses her hand - Endorses some weakness of right hand; for example, unable to hold down tab on childproof prescription medication bottle with right thumb, but can do so with left thumb - Has tried applying hot towel and using vibrating pad on area with no relief  Crohn's disease - No recent issues - Previous gastroenterologist, Dr. Teena Irani, is no longer practicing in area and has not seen a GI doctor in a long time  Need for medication refills Was unable to pick up Plavix and simvastatin at pharmacy and would like refills on these medications  Transportation services paperwork Requested assistance completing paperwork for SCAT application based on chronic medical conditions   ROS: See HPI.  PMH: Crohn's disease, CKD, CAD  PHYSICAL EXAM: BP 130/64   Pulse 83   Temp 98.1 F (36.7 C) (Oral)   Ht 5' 6"  (1.676 m)   Wt 120 lb 12.8 oz (54.8 kg)   SpO2 99%   BMI 19.50 kg/m  Gen: Well appearing, sitting comfortably in chair, in no acute distress Neuro: grossly intact, speech normal MSK: mild right thenar atrophy versus left hand; weakness with thumb opposition; positive Tinel's and Phalen's sign, right wrist  ASSESSMENT/PLAN:  Health maintenance:  - GI referral placed for follow up on Crohn's disease and colonoscopy  Regional enteritis Given history of Crohn's disease with ileostomy (2002), recommended re-establishing care with gastroenterologist. - Referral placed to [facility]astro[facility]  Carpal tunnel syndrome Positive Tinel's/Phalen's sign combined with paresthesia of palmar surface of right hand and weakness with thumb opposition are highly suggestive of carpal tunnel syndrome. - Placed referral to hand surgeon given muscle weakness - Recommended carpal tunnel wrist brace for symptom relief until appointment with hand surgeon and definitive treatment - Checking TSH today   CAD (coronary artery disease) Refilled Plavix and simvastatin    Michael Litter, [facility]

## 2017-11-24 NOTE — Assessment & Plan Note (Signed)
Refilled Plavix and simvastatin

## 2017-11-24 NOTE — Assessment & Plan Note (Signed)
Positive Tinel's/Phalen's sign combined with paresthesia of palmar surface of right hand and weakness with thumb opposition are highly suggestive of carpal tunnel syndrome. - Placed referral to hand surgeon given muscle weakness - Recommended carpal tunnel wrist brace for symptom relief until appointment with hand surgeon and definitive treatment - Checking TSH today

## 2017-11-25 ENCOUNTER — Encounter: Payer: Self-pay | Admitting: Family Medicine

## 2017-11-25 LAB — BASIC METABOLIC PANEL
BUN/Creatinine Ratio: 12 (ref 12–28)
BUN: 26 mg/dL (ref 8–27)
CALCIUM: 9.4 mg/dL (ref 8.7–10.3)
CO2: 16 mmol/L — AB (ref 20–29)
Chloride: 109 mmol/L — ABNORMAL HIGH (ref 96–106)
Creatinine, Ser: 2.19 mg/dL — ABNORMAL HIGH (ref 0.57–1.00)
GFR, EST AFRICAN AMERICAN: 27 mL/min/{1.73_m2} — AB (ref >59)
GFR, EST NON AFRICAN AMERICAN: 23 mL/min/{1.73_m2} — AB (ref >59)
Glucose: 80 mg/dL (ref 65–99)
POTASSIUM: 4.6 mmol/L (ref 3.5–5.2)
SODIUM: 139 mmol/L (ref 134–144)

## 2017-11-25 LAB — CBC
HEMOGLOBIN: 10 g/dL — AB (ref 11.1–15.9)
Hematocrit: 30 % — ABNORMAL LOW (ref 34.0–46.6)
MCH: 31.1 pg (ref 26.6–33.0)
MCHC: 33.3 g/dL (ref 31.5–35.7)
MCV: 93 fL (ref 79–97)
Platelets: 196 10*3/uL (ref 150–450)
RBC: 3.22 x10E6/uL — AB (ref 3.77–5.28)
RDW: 14 % (ref 12.3–15.4)
WBC: 5.8 10*3/uL (ref 3.4–10.8)

## 2017-11-25 LAB — TSH: TSH: 0.531 u[IU]/mL (ref 0.450–4.500)

## 2017-11-25 NOTE — Assessment & Plan Note (Signed)
Creat reassuring today.

## 2017-11-26 ENCOUNTER — Telehealth: Payer: Self-pay

## 2017-11-26 NOTE — Telephone Encounter (Signed)
Patient left message that she is returning a call. No message seen but did have an OV with labs recently.  Call back is [phone].  Danley Danker, RN [facility] RN)

## 2017-11-30 ENCOUNTER — Other Ambulatory Visit: Payer: Self-pay | Admitting: Family Medicine

## 2017-11-30 DIAGNOSIS — K50013 Crohn's disease of small intestine with fistula: Secondary | ICD-10-CM

## 2017-11-30 NOTE — Telephone Encounter (Signed)
I have called twice and left messages that her recent blood work was fine - no action needed.  I also encouraged her to keep the appointment with the hand surgeon and gastroenterologist.

## 2017-12-20 DIAGNOSIS — Z933 Colostomy status: Secondary | ICD-10-CM | POA: Diagnosis not present

## 2017-12-20 DIAGNOSIS — K5090 Crohn's disease, unspecified, without complications: Secondary | ICD-10-CM | POA: Diagnosis not present

## 2017-12-21 DIAGNOSIS — K5090 Crohn's disease, unspecified, without complications: Secondary | ICD-10-CM | POA: Diagnosis not present

## 2017-12-21 DIAGNOSIS — Z933 Colostomy status: Secondary | ICD-10-CM | POA: Diagnosis not present

## 2017-12-27 DIAGNOSIS — G5601 Carpal tunnel syndrome, right upper limb: Secondary | ICD-10-CM | POA: Diagnosis not present

## 2017-12-29 ENCOUNTER — Other Ambulatory Visit: Payer: Self-pay | Admitting: Family Medicine

## 2017-12-31 ENCOUNTER — Telehealth: Payer: Self-pay | Admitting: *Deleted

## 2017-12-31 NOTE — Telephone Encounter (Signed)
Patient's son calling to request to start process for caregiver assistance through home health.  Will route request to PCP and call back if office visit is needed.  Burna Forts, BSN, RN-BC

## 2018-01-03 NOTE — Telephone Encounter (Signed)
Things are curious.  Mr. Zenia Resides is Ms Weir's significant other, not her son.  They have been together for 12 years.  Since he made the call, a daughter came and got her mother - and is now not returning any of his calls.  Clearly, the situation is in flux.  No action on my part at this time.

## 2018-01-26 ENCOUNTER — Other Ambulatory Visit: Payer: Self-pay | Admitting: *Deleted

## 2018-01-26 ENCOUNTER — Encounter: Payer: Self-pay | Admitting: Neurology

## 2018-01-26 DIAGNOSIS — G5603 Carpal tunnel syndrome, bilateral upper limbs: Secondary | ICD-10-CM

## 2018-01-27 ENCOUNTER — Encounter: Payer: Self-pay | Admitting: Family Medicine

## 2018-02-03 DIAGNOSIS — Z9049 Acquired absence of other specified parts of digestive tract: Secondary | ICD-10-CM | POA: Diagnosis not present

## 2018-02-03 DIAGNOSIS — K50118 Crohn's disease of large intestine with other complication: Secondary | ICD-10-CM | POA: Diagnosis not present

## 2018-02-03 DIAGNOSIS — Z8679 Personal history of other diseases of the circulatory system: Secondary | ICD-10-CM | POA: Diagnosis not present

## 2018-02-03 DIAGNOSIS — Z939 Artificial opening status, unspecified: Secondary | ICD-10-CM | POA: Diagnosis not present

## 2018-02-10 ENCOUNTER — Ambulatory Visit (INDEPENDENT_AMBULATORY_CARE_PROVIDER_SITE_OTHER): Payer: Medicare Other | Admitting: Neurology

## 2018-02-10 DIAGNOSIS — G5603 Carpal tunnel syndrome, bilateral upper limbs: Secondary | ICD-10-CM | POA: Diagnosis not present

## 2018-02-10 DIAGNOSIS — M79641 Pain in right hand: Secondary | ICD-10-CM

## 2018-02-10 NOTE — Procedures (Signed)
[facility]  [address]  [place], [place] [address] Tel: [phone] Fax:  [phone] Test Date:  02/10/2018  Patient: [name] [name] DOB: Oct 31, 1950 Physician: Narda Amber, DO  Sex: Female Height: 5' 6"  Ref Phys: Daryll Brod, MD  ID#: [id number] Temp: 33.3C Technician:    Patient Complaints: This is a 67 year-old female referred for evaluation of bilateral hand paresthesias, worse on the right.  NCV & EMG Findings: Nerve conduction study of the right upper extremity is limited. Findings are as follows: 1. Right median, mixed palmer, and ulnar sensory responses are within normal limits. 2. Right median motor response is within normal limits.  Patient did not tolerate proximal median nerve stimulation and requested to stop.  Impression: This is limited study, as testing was prematurely terminated at patient's request due to pain. Nerve conduction study of the ulnar nerve and electromyography was not performed.  There is no evidence of right carpal tunnel syndrome.    ___________________________ Narda Amber, DO    Nerve Conduction Studies Anti Sensory Summary Table   Stim Site NR Peak (ms) Norm Peak (ms) P-T Amp (V) Norm P-T Amp  Right Median Anti Sensory (2nd Digit)  Wrist    2.6 <3.8 22.2 >10  Right Ulnar Anti Sensory (5th Digit)  Wrist    2.7 <3.2 16.4 >5   Motor Summary Table   Stim Site NR Onset (ms) Norm Onset (ms) O-P Amp (mV) Norm O-P Amp Site1 Site2 Delta-0 (ms) Dist (cm) Vel (m/s) Norm Vel (m/s)  Right Median Motor (Abd Poll Brev)    Paient did not tolerate proximal stimulation  Wrist    2.0 <4.0 10.4 >5 Elbow Wrist 4.7 26.0 55 >50  Elbow    6.7  8.0          Comparison Summary Table   Stim Site NR Peak (ms) Norm Peak (ms) P-T Amp (V) Site1 Site2 Delta-P (ms) Norm Delta (ms)  Right Median/Ulnar Palm Comparison (Wrist - 8cm)  Median Palm    1.6 <2.2 52.7 Median Palm Ulnar Palm 0.2   Ulnar Palm    1.8 <2.2 20.0           Waveforms:

## 2018-02-14 DIAGNOSIS — R200 Anesthesia of skin: Secondary | ICD-10-CM | POA: Diagnosis not present

## 2018-02-14 DIAGNOSIS — G5601 Carpal tunnel syndrome, right upper limb: Secondary | ICD-10-CM | POA: Diagnosis not present

## 2018-02-15 ENCOUNTER — Other Ambulatory Visit: Payer: Self-pay | Admitting: Orthopedic Surgery

## 2018-02-15 DIAGNOSIS — R200 Anesthesia of skin: Secondary | ICD-10-CM

## 2018-02-24 DIAGNOSIS — K5000 Crohn's disease of small intestine without complications: Secondary | ICD-10-CM | POA: Diagnosis not present

## 2018-02-24 DIAGNOSIS — K50118 Crohn's disease of large intestine with other complication: Secondary | ICD-10-CM | POA: Diagnosis not present

## 2018-02-28 ENCOUNTER — Other Ambulatory Visit: Payer: Self-pay | Admitting: Gastroenterology

## 2018-02-28 DIAGNOSIS — K50118 Crohn's disease of large intestine with other complication: Secondary | ICD-10-CM

## 2018-03-07 DIAGNOSIS — Z933 Colostomy status: Secondary | ICD-10-CM | POA: Diagnosis not present

## 2018-03-07 DIAGNOSIS — K5090 Crohn's disease, unspecified, without complications: Secondary | ICD-10-CM | POA: Diagnosis not present

## 2018-03-16 ENCOUNTER — Ambulatory Visit
Admission: RE | Admit: 2018-03-16 | Discharge: 2018-03-16 | Disposition: A | Discharge status: Home / Self Care | Payer: Medicare Other | Source: Ambulatory Visit | Attending: Orthopedic Surgery | Admitting: Orthopedic Surgery

## 2018-03-16 DIAGNOSIS — R200 Anesthesia of skin: Secondary | ICD-10-CM | POA: Diagnosis not present

## 2018-03-18 ENCOUNTER — Other Ambulatory Visit: Payer: Medicare Other

## 2018-03-23 DIAGNOSIS — R200 Anesthesia of skin: Secondary | ICD-10-CM | POA: Diagnosis not present

## 2018-04-07 DIAGNOSIS — Z9049 Acquired absence of other specified parts of digestive tract: Secondary | ICD-10-CM | POA: Diagnosis not present

## 2018-04-07 DIAGNOSIS — K50118 Crohn's disease of large intestine with other complication: Secondary | ICD-10-CM | POA: Diagnosis not present

## 2018-04-07 DIAGNOSIS — D649 Anemia, unspecified: Secondary | ICD-10-CM | POA: Diagnosis not present

## 2018-04-12 ENCOUNTER — Other Ambulatory Visit: Payer: Self-pay | Admitting: Gastroenterology

## 2018-04-12 ENCOUNTER — Other Ambulatory Visit: Payer: Self-pay | Admitting: Pharmacist

## 2018-04-12 DIAGNOSIS — K50118 Crohn's disease of large intestine with other complication: Secondary | ICD-10-CM

## 2018-04-12 NOTE — Patient Outreach (Signed)
[facility]) Care Management  04/12/2018  Haset Oaxaca January 17, 1951 [id number]   67 year old female referred to [facility] for medication review due to High Risk review of [employer] beneficiaries. PMHx includes, but not limited to, CAD, COPD, CKD, CHF.   Left HIPAA compliant message for patient to return my call at her convenience.   Catie Darnelle Maffucci, PharmD PGY2 Ambulatory Care Pharmacy Resident, [facility] Phone: [phone]

## 2018-04-19 ENCOUNTER — Other Ambulatory Visit: Payer: Self-pay | Admitting: Pharmacist

## 2018-04-19 NOTE — Patient Outreach (Signed)
[facility]) Care Management  04/19/2018  [name] [name] Nov 19, 1950 [id number]   67 year old female referred to [facility] for medication review due to High Risk review of [employer] beneficiaries. PMHx includes, but not limited to, CAD, COPD, CKD, CHF.  Left HIPAA compliant message (unsuccessful outreach attempt #2) for patient to return my call at her convenience.   Catie Darnelle Maffucci, PharmD PGY2 Ambulatory Care Pharmacy Resident, [facility] Phone: [phone]

## 2018-04-21 ENCOUNTER — Other Ambulatory Visit: Payer: Medicare Other

## 2018-04-21 ENCOUNTER — Ambulatory Visit
Admission: RE | Admit: 2018-04-21 | Discharge: 2018-04-21 | Disposition: A | Discharge status: Home / Self Care | Payer: Medicare Other | Source: Ambulatory Visit | Attending: Gastroenterology | Admitting: Gastroenterology

## 2018-04-21 DIAGNOSIS — K50118 Crohn's disease of large intestine with other complication: Secondary | ICD-10-CM

## 2018-04-21 DIAGNOSIS — K5090 Crohn's disease, unspecified, without complications: Secondary | ICD-10-CM | POA: Diagnosis not present

## 2018-04-21 DIAGNOSIS — N200 Calculus of kidney: Secondary | ICD-10-CM | POA: Diagnosis not present

## 2018-04-26 NOTE — Patient Outreach (Signed)
[facility]) Care Management  04/26/2018  Sevin Farone 1951-01-08 [id number]   Will close case d/t failure to contact patient.   Catie Darnelle Maffucci, PharmD PGY2 Ambulatory Care Pharmacy Resident, [facility] Phone: [phone]

## 2018-05-03 DIAGNOSIS — Z933 Colostomy status: Secondary | ICD-10-CM | POA: Diagnosis not present

## 2018-05-03 DIAGNOSIS — K5090 Crohn's disease, unspecified, without complications: Secondary | ICD-10-CM | POA: Diagnosis not present

## 2018-05-26 ENCOUNTER — Other Ambulatory Visit: Payer: Self-pay | Admitting: Family Medicine

## 2018-06-15 DIAGNOSIS — R200 Anesthesia of skin: Secondary | ICD-10-CM | POA: Diagnosis not present

## 2018-06-15 DIAGNOSIS — R29898 Other symptoms and signs involving the musculoskeletal system: Secondary | ICD-10-CM | POA: Diagnosis not present

## 2018-06-15 DIAGNOSIS — R292 Abnormal reflex: Secondary | ICD-10-CM | POA: Diagnosis not present

## 2018-07-04 ENCOUNTER — Other Ambulatory Visit: Payer: Self-pay | Admitting: Family Medicine

## 2018-07-06 HISTORY — PX: NECK SURGERY: SHX720

## 2018-08-03 DIAGNOSIS — Z933 Colostomy status: Secondary | ICD-10-CM | POA: Diagnosis not present

## 2018-08-03 DIAGNOSIS — K5090 Crohn's disease, unspecified, without complications: Secondary | ICD-10-CM | POA: Diagnosis not present

## 2018-08-04 DIAGNOSIS — K5090 Crohn's disease, unspecified, without complications: Secondary | ICD-10-CM | POA: Diagnosis not present

## 2018-08-04 DIAGNOSIS — Z933 Colostomy status: Secondary | ICD-10-CM | POA: Diagnosis not present

## 2018-08-09 ENCOUNTER — Other Ambulatory Visit: Payer: Self-pay | Admitting: Family Medicine

## 2018-08-09 DIAGNOSIS — I2510 Atherosclerotic heart disease of native coronary artery without angina pectoris: Secondary | ICD-10-CM

## 2018-09-02 DIAGNOSIS — K5090 Crohn's disease, unspecified, without complications: Secondary | ICD-10-CM | POA: Diagnosis not present

## 2018-09-02 DIAGNOSIS — Z933 Colostomy status: Secondary | ICD-10-CM | POA: Diagnosis not present

## 2018-09-23 ENCOUNTER — Other Ambulatory Visit: Payer: Self-pay | Admitting: Family Medicine

## 2018-09-23 DIAGNOSIS — K50013 Crohn's disease of small intestine with fistula: Secondary | ICD-10-CM

## 2018-09-28 ENCOUNTER — Other Ambulatory Visit: Payer: Self-pay | Admitting: Family Medicine

## 2018-10-03 DIAGNOSIS — Z933 Colostomy status: Secondary | ICD-10-CM | POA: Diagnosis not present

## 2018-10-03 DIAGNOSIS — K5090 Crohn's disease, unspecified, without complications: Secondary | ICD-10-CM | POA: Diagnosis not present

## 2018-10-27 DIAGNOSIS — K50118 Crohn's disease of large intestine with other complication: Secondary | ICD-10-CM | POA: Diagnosis not present

## 2018-10-27 DIAGNOSIS — D649 Anemia, unspecified: Secondary | ICD-10-CM | POA: Diagnosis not present

## 2018-11-01 DIAGNOSIS — K50118 Crohn's disease of large intestine with other complication: Secondary | ICD-10-CM | POA: Diagnosis not present

## 2018-11-01 DIAGNOSIS — D649 Anemia, unspecified: Secondary | ICD-10-CM | POA: Diagnosis not present

## 2018-11-02 ENCOUNTER — Other Ambulatory Visit: Payer: Self-pay

## 2018-11-02 ENCOUNTER — Telehealth (INDEPENDENT_AMBULATORY_CARE_PROVIDER_SITE_OTHER): Payer: Medicare Other | Admitting: Family Medicine

## 2018-11-02 DIAGNOSIS — K50013 Crohn's disease of small intestine with fistula: Secondary | ICD-10-CM

## 2018-11-02 DIAGNOSIS — M25561 Pain in right knee: Secondary | ICD-10-CM | POA: Insufficient documentation

## 2018-11-02 DIAGNOSIS — R202 Paresthesia of skin: Secondary | ICD-10-CM

## 2018-11-02 DIAGNOSIS — R200 Anesthesia of skin: Secondary | ICD-10-CM

## 2018-11-02 NOTE — Assessment & Plan Note (Signed)
Reviewed notes.  Normal nerve conduction studies made carpal tunnel less likely.  Patient was referred to ortho who suspects cervical radiculopathy.  MRI of neck ordered last fall and through a series of misadventures, never done. She will call ortho (given number of NP [place] who did visit.)  Suggested she do this promptly.

## 2018-11-02 NOTE — Assessment & Plan Note (Signed)
Sounds like osteoarthritis of knee with flair.  I ordered x rays.  I will call patient when I see those results.

## 2018-11-02 NOTE — Assessment & Plan Note (Signed)
With fistula, needs to be seen by GI.  She will call for appointment.  She will also request that I receive copy of last colonoscopy.

## 2018-11-02 NOTE — Progress Notes (Signed)
Patient agrees to video visit. Visit complicated by her low health literacy and anxiety.   I am continuity doc so I know well.   CC hand problem: Previously, seen at the hand center,  [place] through call, video went out.  Continued call by phone. Seen by hand surg and questioned diagnosis of carpal tunnel because of normal nerve conduction studies.  Then seen by ortho who found hyperreflexia concerning for C spine problem  Ordered MRI.  Not done.  Now with bad numbness in palm extending up to elbow.  Worsening.    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 Also has right leg pain and numbness.  Last seen by GI for colonoscopy. Thinks she had it 6-7 months ago.  I do not have that report.    Leg problem is in its forth day.  No trauma.  Feeling pain and numbness around the knee.  Knee occasionally locks on her.  On further discussion, has had knee pain and catching for months.  Acutely worse four days ago.  No redness or swelling.  Duration of visit was 31 minutes

## 2018-11-10 ENCOUNTER — Other Ambulatory Visit: Payer: Self-pay | Admitting: Family Medicine

## 2018-12-06 DIAGNOSIS — Z933 Colostomy status: Secondary | ICD-10-CM | POA: Diagnosis not present

## 2018-12-06 DIAGNOSIS — K5090 Crohn's disease, unspecified, without complications: Secondary | ICD-10-CM | POA: Diagnosis not present

## 2018-12-16 ENCOUNTER — Other Ambulatory Visit: Payer: Self-pay

## 2018-12-16 DIAGNOSIS — L231 Allergic contact dermatitis due to adhesives: Secondary | ICD-10-CM

## 2018-12-16 MED ORDER — LORAZEPAM 0.5 MG PO TABS: 0.5000 mg | ORAL_TABLET | Freq: Two times a day (BID) | ORAL | 5 refills | Status: DC | PRN

## 2018-12-16 MED ORDER — TRIAMCINOLONE ACETONIDE 0.1 % EX OINT: 1.0000 "application " | TOPICAL_OINTMENT | Freq: Two times a day (BID) | CUTANEOUS | 6 refills | Status: DC

## 2018-12-20 ENCOUNTER — Other Ambulatory Visit: Payer: Self-pay | Admitting: Family Medicine

## 2018-12-20 ENCOUNTER — Other Ambulatory Visit: Payer: Self-pay

## 2018-12-20 DIAGNOSIS — K50013 Crohn's disease of small intestine with fistula: Secondary | ICD-10-CM

## 2018-12-20 DIAGNOSIS — I2510 Atherosclerotic heart disease of native coronary artery without angina pectoris: Secondary | ICD-10-CM

## 2018-12-20 MED ORDER — CLOPIDOGREL BISULFATE 75 MG PO TABS: 75.0000 mg | ORAL_TABLET | Freq: Every day | ORAL | 3 refills | Status: DC

## 2018-12-20 MED ORDER — SIMVASTATIN 20 MG PO TABS: 20.0000 mg | ORAL_TABLET | Freq: Every day | ORAL | 3 refills | Status: DC

## 2018-12-21 DIAGNOSIS — G5601 Carpal tunnel syndrome, right upper limb: Secondary | ICD-10-CM | POA: Diagnosis not present

## 2018-12-21 DIAGNOSIS — Z01812 Encounter for preprocedural laboratory examination: Secondary | ICD-10-CM | POA: Diagnosis not present

## 2018-12-21 DIAGNOSIS — Z01818 Encounter for other preprocedural examination: Secondary | ICD-10-CM | POA: Diagnosis not present

## 2018-12-21 DIAGNOSIS — Z1159 Encounter for screening for other viral diseases: Secondary | ICD-10-CM | POA: Diagnosis not present

## 2018-12-22 ENCOUNTER — Ambulatory Visit: Payer: Medicare Other | Admitting: Family Medicine

## 2018-12-28 DIAGNOSIS — M47814 Spondylosis without myelopathy or radiculopathy, thoracic region: Secondary | ICD-10-CM | POA: Diagnosis not present

## 2018-12-28 DIAGNOSIS — M50221 Other cervical disc displacement at C4-C5 level: Secondary | ICD-10-CM | POA: Diagnosis not present

## 2018-12-28 DIAGNOSIS — G5601 Carpal tunnel syndrome, right upper limb: Secondary | ICD-10-CM | POA: Diagnosis not present

## 2018-12-28 DIAGNOSIS — R29898 Other symptoms and signs involving the musculoskeletal system: Secondary | ICD-10-CM | POA: Diagnosis not present

## 2018-12-28 DIAGNOSIS — M4802 Spinal stenosis, cervical region: Secondary | ICD-10-CM | POA: Diagnosis not present

## 2018-12-28 DIAGNOSIS — R292 Abnormal reflex: Secondary | ICD-10-CM | POA: Diagnosis not present

## 2018-12-28 DIAGNOSIS — M50222 Other cervical disc displacement at C5-C6 level: Secondary | ICD-10-CM | POA: Diagnosis not present

## 2018-12-28 DIAGNOSIS — M50223 Other cervical disc displacement at C6-C7 level: Secondary | ICD-10-CM | POA: Diagnosis not present

## 2018-12-28 DIAGNOSIS — M5021 Other cervical disc displacement,  high cervical region: Secondary | ICD-10-CM | POA: Diagnosis not present

## 2018-12-28 DIAGNOSIS — R200 Anesthesia of skin: Secondary | ICD-10-CM | POA: Diagnosis not present

## 2018-12-29 ENCOUNTER — Telehealth: Payer: Self-pay

## 2018-12-29 NOTE — Telephone Encounter (Signed)
Called: Yes, OK to hold plavix for planned surgery.  Restart when hemodynamically stable.

## 2018-12-29 NOTE — Telephone Encounter (Signed)
Meredith Pel from the [facility] is calling to let you know pt is having surger on  July 2nd. Pt is on Plavix. Marci needs Dr. Christie Beckers for pt to stop taking the plavix until after her surgery. Please call her cell phone at [phone]. Ottis Stain, CMA

## 2019-01-04 DIAGNOSIS — M4802 Spinal stenosis, cervical region: Secondary | ICD-10-CM | POA: Diagnosis not present

## 2019-01-04 DIAGNOSIS — R261 Paralytic gait: Secondary | ICD-10-CM | POA: Diagnosis not present

## 2019-01-04 DIAGNOSIS — R292 Abnormal reflex: Secondary | ICD-10-CM | POA: Diagnosis not present

## 2019-01-05 DIAGNOSIS — K5090 Crohn's disease, unspecified, without complications: Secondary | ICD-10-CM | POA: Diagnosis not present

## 2019-01-05 DIAGNOSIS — Z933 Colostomy status: Secondary | ICD-10-CM | POA: Diagnosis not present

## 2019-01-13 DIAGNOSIS — Z1159 Encounter for screening for other viral diseases: Secondary | ICD-10-CM | POA: Diagnosis not present

## 2019-01-13 DIAGNOSIS — M4802 Spinal stenosis, cervical region: Secondary | ICD-10-CM | POA: Diagnosis not present

## 2019-01-13 DIAGNOSIS — Z01812 Encounter for preprocedural laboratory examination: Secondary | ICD-10-CM | POA: Diagnosis not present

## 2019-01-19 DIAGNOSIS — G959 Disease of spinal cord, unspecified: Secondary | ICD-10-CM | POA: Diagnosis not present

## 2019-01-19 DIAGNOSIS — K210 Gastro-esophageal reflux disease with esophagitis: Secondary | ICD-10-CM | POA: Diagnosis not present

## 2019-01-19 DIAGNOSIS — G992 Myelopathy in diseases classified elsewhere: Secondary | ICD-10-CM | POA: Diagnosis not present

## 2019-01-19 DIAGNOSIS — K5090 Crohn's disease, unspecified, without complications: Secondary | ICD-10-CM | POA: Diagnosis not present

## 2019-01-19 DIAGNOSIS — G9589 Other specified diseases of spinal cord: Secondary | ICD-10-CM | POA: Diagnosis not present

## 2019-01-19 DIAGNOSIS — M4802 Spinal stenosis, cervical region: Secondary | ICD-10-CM | POA: Diagnosis not present

## 2019-01-19 DIAGNOSIS — E7800 Pure hypercholesterolemia, unspecified: Secondary | ICD-10-CM | POA: Diagnosis not present

## 2019-01-19 DIAGNOSIS — J449 Chronic obstructive pulmonary disease, unspecified: Secondary | ICD-10-CM | POA: Diagnosis not present

## 2019-01-19 DIAGNOSIS — Z933 Colostomy status: Secondary | ICD-10-CM | POA: Diagnosis not present

## 2019-01-19 DIAGNOSIS — N183 Chronic kidney disease, stage 3 (moderate): Secondary | ICD-10-CM | POA: Diagnosis not present

## 2019-01-19 DIAGNOSIS — I130 Hypertensive heart and chronic kidney disease with heart failure and stage 1 through stage 4 chronic kidney disease, or unspecified chronic kidney disease: Secondary | ICD-10-CM | POA: Diagnosis not present

## 2019-01-19 DIAGNOSIS — I2510 Atherosclerotic heart disease of native coronary artery without angina pectoris: Secondary | ICD-10-CM | POA: Diagnosis not present

## 2019-01-19 DIAGNOSIS — I509 Heart failure, unspecified: Secondary | ICD-10-CM | POA: Diagnosis not present

## 2019-01-19 DIAGNOSIS — D631 Anemia in chronic kidney disease: Secondary | ICD-10-CM | POA: Diagnosis not present

## 2019-01-22 MED ORDER — ALUMINUM-MAGNESIUM-SIMETHICONE 200-200-20 MG/5ML PO SUSP
30.00 | ORAL | Status: DC
Start: ? — End: 2019-01-22

## 2019-01-22 MED ORDER — TRAMADOL HCL 50 MG PO TABS
50.00 | ORAL_TABLET | ORAL | Status: DC
Start: ? — End: 2019-01-22

## 2019-01-22 MED ORDER — MELATONIN 3 MG PO TABS
6.00 | ORAL_TABLET | ORAL | Status: DC
Start: ? — End: 2019-01-22

## 2019-01-22 MED ORDER — GENERIC EXTERNAL MEDICATION
10.00 | Status: DC
Start: 2019-01-22 — End: 2019-01-22

## 2019-01-22 MED ORDER — LORAZEPAM 0.5 MG PO TABS
0.50 | ORAL_TABLET | ORAL | Status: DC
Start: 2019-01-22 — End: 2019-01-22

## 2019-01-22 MED ORDER — HYDROCODONE-ACETAMINOPHEN 5-325 MG PO TABS
1.00 | ORAL_TABLET | ORAL | Status: DC
Start: ? — End: 2019-01-22

## 2019-01-22 MED ORDER — GENERIC EXTERNAL MEDICATION
10.00 | Status: DC
Start: ? — End: 2019-01-22

## 2019-01-22 MED ORDER — ENEMA 7-19 GM/118ML RE ENEM
1.00 | ENEMA | RECTAL | Status: DC
Start: ? — End: 2019-01-22

## 2019-01-22 MED ORDER — ACETAMINOPHEN 325 MG PO TABS
325.00 | ORAL_TABLET | ORAL | Status: DC
Start: 2019-01-22 — End: 2019-01-22

## 2019-01-22 MED ORDER — DIPHENHYDRAMINE HCL 25 MG PO CAPS
25.00 | ORAL_CAPSULE | ORAL | Status: DC
Start: ? — End: 2019-01-22

## 2019-01-22 MED ORDER — CALCIUM CITRATE 200 MG PO TABS
950.00 | ORAL_TABLET | ORAL | Status: DC
Start: 2019-01-23 — End: 2019-01-22

## 2019-01-22 MED ORDER — GENERIC EXTERNAL MEDICATION
500.00 | Status: DC
Start: 2019-01-22 — End: 2019-01-22

## 2019-01-22 MED ORDER — BISACODYL 10 MG RE SUPP
10.00 | RECTAL | Status: DC
Start: ? — End: 2019-01-22

## 2019-01-22 MED ORDER — SENNOSIDES-DOCUSATE SODIUM 8.6-50 MG PO TABS
2.00 | ORAL_TABLET | ORAL | Status: DC
Start: 2019-01-22 — End: 2019-01-22

## 2019-01-22 MED ORDER — POLYSACCHARIDE IRON COMPLEX 150 MG PO CAPS
150.00 | ORAL_CAPSULE | ORAL | Status: DC
Start: 2019-01-23 — End: 2019-01-22

## 2019-01-22 MED ORDER — SODIUM CHLORIDE 0.9 % IV SOLN
INTRAVENOUS | Status: DC
Start: ? — End: 2019-01-22

## 2019-02-02 DIAGNOSIS — Z933 Colostomy status: Secondary | ICD-10-CM | POA: Diagnosis not present

## 2019-02-02 DIAGNOSIS — K5090 Crohn's disease, unspecified, without complications: Secondary | ICD-10-CM | POA: Diagnosis not present

## 2019-02-03 DIAGNOSIS — Z933 Colostomy status: Secondary | ICD-10-CM | POA: Diagnosis not present

## 2019-02-03 DIAGNOSIS — K5090 Crohn's disease, unspecified, without complications: Secondary | ICD-10-CM | POA: Diagnosis not present

## 2019-02-22 DIAGNOSIS — Z9889 Other specified postprocedural states: Secondary | ICD-10-CM | POA: Diagnosis not present

## 2019-02-22 DIAGNOSIS — M4802 Spinal stenosis, cervical region: Secondary | ICD-10-CM | POA: Diagnosis not present

## 2019-05-03 DIAGNOSIS — Z981 Arthrodesis status: Secondary | ICD-10-CM | POA: Diagnosis not present

## 2019-05-03 DIAGNOSIS — M4802 Spinal stenosis, cervical region: Secondary | ICD-10-CM | POA: Diagnosis not present

## 2019-05-03 DIAGNOSIS — M50323 Other cervical disc degeneration at C6-C7 level: Secondary | ICD-10-CM | POA: Diagnosis not present

## 2019-05-18 ENCOUNTER — Other Ambulatory Visit: Payer: Self-pay | Admitting: Family Medicine

## 2019-05-26 ENCOUNTER — Ambulatory Visit (INDEPENDENT_AMBULATORY_CARE_PROVIDER_SITE_OTHER): Payer: Medicare Other | Admitting: *Deleted

## 2019-05-26 ENCOUNTER — Other Ambulatory Visit: Payer: Self-pay

## 2019-05-26 DIAGNOSIS — Z23 Encounter for immunization: Secondary | ICD-10-CM

## 2019-05-26 NOTE — Progress Notes (Signed)
Pt tolerated vaccine well. Printice Hellmer Laretta Alstrom, CMA

## 2019-05-29 DIAGNOSIS — R200 Anesthesia of skin: Secondary | ICD-10-CM | POA: Diagnosis not present

## 2019-06-05 ENCOUNTER — Ambulatory Visit: Payer: Medicare Other | Admitting: Family Medicine

## 2019-06-05 DIAGNOSIS — Z933 Colostomy status: Secondary | ICD-10-CM | POA: Diagnosis not present

## 2019-06-05 DIAGNOSIS — K5090 Crohn's disease, unspecified, without complications: Secondary | ICD-10-CM | POA: Diagnosis not present

## 2019-06-14 ENCOUNTER — Other Ambulatory Visit: Payer: Self-pay

## 2019-06-14 ENCOUNTER — Ambulatory Visit (INDEPENDENT_AMBULATORY_CARE_PROVIDER_SITE_OTHER): Payer: Medicare Other | Admitting: Family Medicine

## 2019-06-14 ENCOUNTER — Encounter: Payer: Self-pay | Admitting: Family Medicine

## 2019-06-14 VITALS — BP 130/64 | HR 86 | Wt 131.0 lb

## 2019-06-14 DIAGNOSIS — D631 Anemia in chronic kidney disease: Secondary | ICD-10-CM

## 2019-06-14 DIAGNOSIS — E7800 Pure hypercholesterolemia, unspecified: Secondary | ICD-10-CM | POA: Diagnosis not present

## 2019-06-14 DIAGNOSIS — Z720 Tobacco use: Secondary | ICD-10-CM

## 2019-06-14 DIAGNOSIS — Z23 Encounter for immunization: Secondary | ICD-10-CM

## 2019-06-14 DIAGNOSIS — I2510 Atherosclerotic heart disease of native coronary artery without angina pectoris: Secondary | ICD-10-CM

## 2019-06-14 DIAGNOSIS — K50013 Crohn's disease of small intestine with fistula: Secondary | ICD-10-CM | POA: Diagnosis not present

## 2019-06-14 DIAGNOSIS — N184 Chronic kidney disease, stage 4 (severe): Secondary | ICD-10-CM | POA: Diagnosis not present

## 2019-06-14 DIAGNOSIS — R200 Anesthesia of skin: Secondary | ICD-10-CM

## 2019-06-14 DIAGNOSIS — R202 Paresthesia of skin: Secondary | ICD-10-CM

## 2019-06-14 NOTE — Patient Instructions (Addendum)
It is OK to wean yourself off the citalopram.  Take 1/2 tab per day for two weeks then stop. I will call with the blood test results. I removed COPD from your problem list. You really need to quit smoking. Someone should call to make a cardiology appointment for you.  They will determine if you need more testing.

## 2019-06-15 ENCOUNTER — Telehealth: Payer: Self-pay | Admitting: *Deleted

## 2019-06-15 LAB — CMP14+EGFR
ALT: 16 IU/L (ref 0–32)
AST: 24 IU/L (ref 0–40)
Albumin/Globulin Ratio: 1.3 (ref 1.2–2.2)
Albumin: 4.3 g/dL (ref 3.8–4.8)
Alkaline Phosphatase: 134 IU/L — ABNORMAL HIGH (ref 39–117)
BUN/Creatinine Ratio: 10 — ABNORMAL LOW (ref 12–28)
BUN: 20 mg/dL (ref 8–27)
Bilirubin Total: 0.3 mg/dL (ref 0.0–1.2)
CO2: 16 mmol/L — ABNORMAL LOW (ref 20–29)
Calcium: 9.3 mg/dL (ref 8.7–10.3)
Chloride: 114 mmol/L — ABNORMAL HIGH (ref 96–106)
Creatinine, Ser: 2.05 mg/dL — ABNORMAL HIGH (ref 0.57–1.00)
GFR calc Af Amer: 28 mL/min/{1.73_m2} — ABNORMAL LOW (ref >59)
GFR calc non Af Amer: 25 mL/min/{1.73_m2} — ABNORMAL LOW (ref >59)
Globulin, Total: 3.2 g/dL (ref 1.5–4.5)
Glucose: 86 mg/dL (ref 65–99)
Potassium: 4.7 mmol/L (ref 3.5–5.2)
Sodium: 142 mmol/L (ref 134–144)
Total Protein: 7.5 g/dL (ref 6.0–8.5)

## 2019-06-15 LAB — CBC
Hematocrit: 30.5 % — ABNORMAL LOW (ref 34.0–46.6)
Hemoglobin: 10.1 g/dL — ABNORMAL LOW (ref 11.1–15.9)
MCH: 31.8 pg (ref 26.6–33.0)
MCHC: 33.1 g/dL (ref 31.5–35.7)
MCV: 96 fL (ref 79–97)
Platelets: 188 10*3/uL (ref 150–450)
RBC: 3.18 x10E6/uL — ABNORMAL LOW (ref 3.77–5.28)
RDW: 13 % (ref 11.7–15.4)
WBC: 5.8 10*3/uL (ref 3.4–10.8)

## 2019-06-15 LAB — LIPID PANEL
Chol/HDL Ratio: 3.3 ratio (ref 0.0–4.4)
Cholesterol, Total: 183 mg/dL (ref 100–199)
HDL: 55 mg/dL (ref >39)
LDL Chol Calc (NIH): 106 mg/dL — ABNORMAL HIGH (ref 0–99)
Triglycerides: 121 mg/dL (ref 0–149)
VLDL Cholesterol Cal: 22 mg/dL (ref 5–40)

## 2019-06-15 MED ORDER — ATORVASTATIN CALCIUM 40 MG PO TABS: 40.0000 mg | ORAL_TABLET | Freq: Every day | ORAL | 3 refills | Status: DC

## 2019-06-15 NOTE — Telephone Encounter (Signed)
A message was left, re: her new patient appointment. 

## 2019-06-17 ENCOUNTER — Encounter: Payer: Self-pay | Admitting: Family Medicine

## 2019-06-17 NOTE — Assessment & Plan Note (Signed)
Check labs.  Currently on statin.  Changed to atorvastatin since not at goal on simvastatin.

## 2019-06-17 NOTE — Assessment & Plan Note (Signed)
Creat is stable.

## 2019-06-17 NOTE — Assessment & Plan Note (Signed)
Strongly encouraged smoking cessation.

## 2019-06-17 NOTE — Assessment & Plan Note (Signed)
Stable hgb at 10.1

## 2019-06-17 NOTE — Progress Notes (Signed)
   Subjective:    Patient ID: [name], female    DOB: 06-Aug-1950, 68 y.o.   MRN: [id number]  HPI Multiple issues: 1. Rt. Hand numbness.  Had surgery on neck July 2020 for radiculopathy.  Right had numbness continues.  She has follow up planned with the hand surgeon. 2. Anxiety. Mom is COVID + in a nursing home.  Patient has chronic anxiety and this has worsened it.  She has no exposure to her mom, so no covid testing or isolation is indicated.  Paradoxically, despite concerns over mom worsening anxiety, she wishes to wean herself off the citalopram.  "I don't think it is helping." 3. Tobacco abuse.  I am not sure when/why Dx of COPD entered on problem list.  I see no confirmation and patient does not have hospitalizations or ongoing medications.  Will remove per patient request.  Also emphasized how important it is for her to quit smoking. 4. Hx of CHF.  Now asymptomatic.  Denies DOE or pedal edema.  I believe the CHF was thought to be viral myocarditis.  It has been years since she has seen cards.  Will refer back to see if FU testing is indicatied.   5. She is followed by [facility] for her Crohns.  I am almost certain she is up to date on colon cancer screen - but my records show due.  Will ask for release.      Review of Systems     Objective:   Physical Exam  Lungs clear Cardiac RRR without m or g Abd benign Ext no edema.        Assessment & Plan:

## 2019-06-17 NOTE — Assessment & Plan Note (Signed)
FU with hand surg.

## 2019-06-28 ENCOUNTER — Telehealth: Payer: Self-pay | Admitting: *Deleted

## 2019-06-28 NOTE — Telephone Encounter (Signed)
A message was left, re: her new patient appointment. 

## 2019-07-05 ENCOUNTER — Encounter: Payer: Self-pay | Admitting: Family Medicine

## 2019-07-19 DIAGNOSIS — M4802 Spinal stenosis, cervical region: Secondary | ICD-10-CM | POA: Diagnosis not present

## 2019-07-19 DIAGNOSIS — R200 Anesthesia of skin: Secondary | ICD-10-CM | POA: Diagnosis not present

## 2019-07-23 NOTE — Progress Notes (Signed)
Cardiology Office Note:   Date:  07/25/2019  NAME:  [name] [name]    MRN: [id number] DOB:  09-Aug-1950   PCP:  Zenia Resides, MD  Cardiologist:  No primary care provider on file.   Referring MD: Zenia Resides, MD   Chief Complaint  Patient presents with  . Shortness of Breath   History of Present Illness:   [name] [name] is a 68 y.o. female with a hx of nonischemic cardiomyopathy with recovery of ejection fraction, CKD 4, hyperlipidemia who is being seen today for the evaluation of CHF at the request of Zenia Resides, MD.  She was diagnosed with systolic heart failure in 2012.  She had a normal stress test at that time.  Her EF recovered up to 60%.  She has been lost to follow-up since that time.  She has been doing relatively well since 2013.  She reports no chest pain or trouble breathing with exertion.  She has no lower extremity edema or symptoms of heart failure to report today.  She does report some left-sided pain in her chest wall.  She reports this occurs when sleeping on it.  The pain is described as sharp and achy.  She does not have this pain when she is exerting herself or exercising.  She is able to walk without any limitations.  She denies any shortness of breath with exertion.  She denies any orthopnea or PND.  Her blood pressure is well controlled today 130/62.  Most recent lipid profile shows LDL of 106.  She is on a statin.  She remains on Plavix due to aspirin allergy.  She continues to smoke.  She smokes about 1/4 pack a day for about 18 years.  Not wanting to quit at this time.  Reports she will think about it.  1. Non-ischemic cardiomyopathy with recovery of LVEF -EF 35% -> 60% -normal MPI -2012 2. CKD IV 3. Hyperlipidemia total cholesterol 183, HDL 55, LDL 106, triglycerides 121  Past Medical History: Past Medical History:  Diagnosis Date  . Abnormal chest CT    Coronary atherosclerosis on chest CT 2012  . Anemia, chronic renal failure   . Anxiety   .  Asthma 05/2011  . CHF (congestive heart failure) (HCC)    EF 30-35% 2012->EF 60-65% 2013  . CKD (chronic kidney disease), stage III   . Crohn's disease ([place])   . GERD (gastroesophageal reflux disease)   . Headache(784.0)    "related to high BP" (05/30/2012)  . 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 Hyperlipidemia   . Hypertension   . Panic attacks   . Tobacco abuse     Past Surgical History: Past Surgical History:  Procedure Laterality Date  . CATARACT EXTRACTION W/ INTRAOCULAR LENS  IMPLANT, BILATERAL  ~ 2000  . CHOLECYSTECTOMY  01/28/2005  . COLOSTOMY  03/1996   diverting  . ECTOPIC PREGNANCY SURGERY  ?1980's   left  . ILEOSTOMY  ?  2002    Current Medications: Current Meds  Medication Sig  . ascorbic acid (VITAMIN C) 500 MG tablet Take by mouth.  Marland Kitchen atorvastatin (LIPITOR) 40 MG tablet Take 1 tablet (40 mg total) by mouth daily. Stop taking simvastatin.  . calcium citrate (CALCITRATE - DOSED IN MG ELEMENTAL CALCIUM) 950 (200 Ca) MG tablet Take by mouth daily.  . cholecalciferol (VITAMIN D) 1000 units tablet Take 1,000 Units by mouth daily.  . clopidogrel (PLAVIX) 75 MG tablet Take 1 tablet (75 mg total) by mouth daily.  Marland Kitchen  Ferrous Fumarate 86 (27 Fe) MG CAPS Take by mouth daily.  Marland Kitchen LORazepam (ATIVAN) 0.5 MG tablet Take 1 tablet (0.5 mg total) by mouth 2 (two) times daily as needed. for anxiety  . Mesalamine 800 MG TBEC TAKE ONE TABLET BY MOUTH THREE TIMES DAILY  . Multiple Vitamin (MULTIVITAMIN WITH MINERALS) TABS tablet Take 1 tablet by mouth 2 (two) times daily.  . Multiple Vitamin (MULTIVITAMIN) tablet Take 1 tablet by mouth 2 (two) times daily.  . simvastatin (ZOCOR) 20 MG tablet daily.  Marland Kitchen triamcinolone ointment (KENALOG) 0.1 % Apply 1 application topically 2 (two) times daily. Apply to skin around stoma     Allergies:    Amoxicillin, Aspirin, Morphine and related, Penicillins, Gabapentin, Ciprofloxacin, and Ace inhibitors   Social History: Social History    Socioeconomic History  . Marital status: Single    Spouse name: Not on file  . Number of children: 1  . Years of education: associate  . Highest education level: Not on file  Occupational History  . Occupation: Retired-cook    Employer: [employer]  Tobacco Use  . Smoking status: Current Every Day Smoker    Packs/day: 0.12    Years: 18.00    Pack years: 2.16    Types: Cigarettes  . Smokeless tobacco: Never Used  . Tobacco comment: USES E CIGARETTE  Substance and Sexual Activity  . Alcohol use: No    Comment: 05/30/2012 "used to drink back in the day; last alcohol 23 yr ago"  . Drug use: No  . Sexual activity: Not Currently  Other Topics Concern  . Not on file  Social History Narrative   Health Care POA:    Emergency Contact: partner, [name] [name], [phone]   End of Life Plan:    Who lives with you: granddaughter and partner   Any pets: cat- Wild Thing   Diet: pt has a varied diet of protein, starch, and vegetables    Exercise: Pt does not have any regular exercise routine   Seatbelts:Pt reports wearing seatbelt when in vehicles.    Nancy Fetter Exposure/Protection:    Hobbies: cooking, bingo, basketball            Social Determinants of Radio broadcast assistant Strain:   . Difficulty of Paying Living Expenses: Not on file  Food Insecurity:   . Worried About Charity fundraiser in the Last Year: Not on file  . Ran Out of Food in the Last Year: Not on file  Transportation Needs:   . Lack of Transportation (Medical): Not on file  . Lack of Transportation (Non-Medical): Not on file  Physical Activity:   . Days of Exercise per Week: Not on file  . Minutes of Exercise per Session: Not on file  Stress:   . Feeling of Stress : Not on file  Social Connections:   . Frequency of Communication with Friends and Family: Not on file  . Frequency of Social Gatherings with Friends and Family: Not on file  . Attends Religious Services: Not on file  . Active Member of Clubs  or Organizations: Not on file  . Attends Archivist Meetings: Not on file  . Marital Status: Not on file     Family History: The patient's family history includes Heart disease in her mother; Stroke in her father.  ROS:   All other ROS reviewed and negative. Pertinent positives noted in the HPI.     EKGs/Labs/Other Studies Reviewed:   The following studies were  personally reviewed by me today:  EKG:  EKG is ordered today.  The ekg ordered today demonstrates NSR 93, LVH, and was personally reviewed by me.   TTE 12/2011 - Left ventricle: The cavity size was normal. Wall thickness  was normal. Systolic function was normal. The estimated  ejection fraction was in the range of 60% to 65%.  - Aortic valve: Trivial regurgitation.  - Mitral valve: Moderate regurgitation.  Transthoracic echocardiography. M-mode, complete 2D,  spectral Doppler, and color Doppler. Height: Height:  167.6cm. Height: 66in. Weight: Weight: 70.3kg. Weight:  154.7lb. Body mass index: BMI: 25kg/m^2. Body surface  area:  BSA: 1.54m2. Blood pressure:   110/76. Patient  status: Outpatient. [facility]   Recent Labs: 06/14/2019: ALT 16; BUN 20; Creatinine, Ser 2.05; Hemoglobin 10.1; Platelets 188; Potassium 4.7; Sodium 142   Recent Lipid Panel    Component Value Date/Time   CHOL 183 06/14/2019 1553   TRIG 121 06/14/2019 1553   HDL 55 06/14/2019 1553   CHOLHDL 3.3 06/14/2019 1553   CHOLHDL 3.9 03/19/2016 1200   VLDL 36 (H) 03/19/2016 1200   LDLCALC 106 (H) 06/14/2019 1553   LDLDIRECT 129 (H) 07/23/2006 2031    Physical Exam:   VS:  BP 130/62   Pulse 93   Ht 5' 6"  (1.676 m)   Wt 120 lb 9.6 oz (54.7 kg)   SpO2 95%   BMI 19.47 kg/m    Wt Readings from Last 3 Encounters:  07/25/19 120 lb 9.6 oz (54.7 kg)  06/14/19 131 lb (59.4 kg)  11/24/17 120 lb 12.8 oz (54.8 kg)    General: Well nourished, well developed, in no acute distress Heart: Atraumatic, normal  size  Eyes: PEERLA, EOMI  Neck: Supple, no JVD Endocrine: No thryomegaly Cardiac: Normal S1, S2; RRR; no murmurs, rubs, or gallops Lungs: Clear to auscultation bilaterally, no wheezing, rhonchi or rales  Abd: Soft, nontender, no hepatomegaly  Ext: No edema, pulses 2+ Musculoskeletal: No deformities, BUE and BLE strength normal and equal Skin: Warm and dry, no rashes   Neuro: Alert and oriented to person, place, time, and situation, CNII-XII grossly intact, no focal deficits  Psych: Normal mood and affect   ASSESSMENT:   OJaimie Pippinsis a 69y.o. female who presents for the following: 1. Chronic systolic heart failure ([place]   2. Essential hypertension   3. Mixed hyperlipidemia   4. LVH (left ventricular hypertrophy)   5. Intercostal pain   6. Tobacco abuse     PLAN:   1. Chronic systolic heart failure (HCC) -EF 35% in 2012.  Recovered function up to 60% in 2013.  Negative stress test.  She has no symptoms of congestive heart failure.  We will recheck an echocardiogram just to ensure that her heart has maintained normal function.  She is no longer on any medications such as beta-blocker or ACE inhibitor.  She has significant CKD and will not be a candidate for an ACE inhibitor anyway.  If her echo is normal we will plan to see her on a yearly basis.  2. Essential hypertension -Well-controlled today.  She appears to be on no medication.  3. Mixed hyperlipidemia -Recent lipid profile with LDL 106 -Continue Lipitor 40 mg daily -She is on Plavix due to aspirin allergy  4. LVH (left ventricular hypertrophy) -EKG with LVH.  We will check an echocardiogram just to make sure heart function is normal.  5. Intercostal pain -She reports pain in her left chest with sleeping.  It is worse when she lays on that side.  This is all musculoskeletal.  I advised her to pursue a heating pad.  EKG without any acute changes.  She will avoid NSAIDs due to her kidney function.  6. Tobacco abuse  -Quarter pack a day for 18 years.  Advised her to quit.  Disposition: Return in about 1 year (around 07/24/2020).  Medication Adjustments/Labs and Tests Ordered: Current medicines are reviewed at length with the patient today.  Concerns regarding medicines are outlined above.  Orders Placed This Encounter  Procedures  . EKG 12-Lead  . ECHOCARDIOGRAM COMPLETE   No orders of the defined types were placed in this encounter.   Patient Instructions  Medication Instructions:  The current medical regimen is effective;  continue present plan and medications.  *If you need a refill on your cardiac medications before your next appointment, please call your pharmacy*  Testing/Procedures: Echocardiogram - Your physician has requested that you have an echocardiogram. Echocardiography is a painless test that uses sound waves to create images of your heart. It provides your doctor with information about the size and shape of your heart and how well your heart's chambers and valves are working. This procedure takes approximately one hour. There are no restrictions for this procedure. This will be performed at our [facility] location - [address], [address].   Follow-Up: At [facility], you and your health needs are our priority.  As part of our continuing mission to provide you with exceptional heart care, we have created designated Provider Care Teams.  These Care Teams include your primary Cardiologist (physician) and Advanced Practice Providers (APPs -  Physician Assistants and Nurse Practitioners) who all work together to provide you with the care you need, when you need it.  Your next appointment:   12 month(s)  The format for your next appointment:   In Person  Provider:   Eleonore Chiquito, MD        Signed, Addison Naegeli. Audie Box, [place]  [address] [place], [place] [address] [phone]  07/25/2019 2:35 PM

## 2019-07-25 ENCOUNTER — Other Ambulatory Visit: Payer: Self-pay

## 2019-07-25 ENCOUNTER — Ambulatory Visit (INDEPENDENT_AMBULATORY_CARE_PROVIDER_SITE_OTHER): Payer: Medicare Other | Admitting: Cardiovascular Disease

## 2019-07-25 ENCOUNTER — Encounter: Payer: Self-pay | Admitting: Cardiovascular Disease

## 2019-07-25 VITALS — BP 130/62 | HR 93 | Ht 66.0 in | Wt 120.6 lb

## 2019-07-25 DIAGNOSIS — E782 Mixed hyperlipidemia: Secondary | ICD-10-CM

## 2019-07-25 DIAGNOSIS — Z720 Tobacco use: Secondary | ICD-10-CM

## 2019-07-25 DIAGNOSIS — I5022 Chronic systolic (congestive) heart failure: Secondary | ICD-10-CM

## 2019-07-25 DIAGNOSIS — I517 Cardiomegaly: Secondary | ICD-10-CM | POA: Diagnosis not present

## 2019-07-25 DIAGNOSIS — I10 Essential (primary) hypertension: Secondary | ICD-10-CM

## 2019-07-25 DIAGNOSIS — R0782 Intercostal pain: Secondary | ICD-10-CM

## 2019-07-25 NOTE — Patient Instructions (Signed)
Medication Instructions:  The current medical regimen is effective;  continue present plan and medications.  *If you need a refill on your cardiac medications before your next appointment, please call your pharmacy*  Testing/Procedures: Echocardiogram - Your physician has requested that you have an echocardiogram. Echocardiography is a painless test that uses sound waves to create images of your heart. It provides your doctor with information about the size and shape of your heart and how well your heart's chambers and valves are working. This procedure takes approximately one hour. There are no restrictions for this procedure. This will be performed at our [facility] location - [address].   Follow-Up: At [facility], you and your health needs are our priority.  As part of our continuing mission to provide you with exceptional heart care, we have created designated Provider Care Teams.  These Care Teams include your primary Cardiologist (physician) and Advanced Practice Providers (APPs -  Physician Assistants and Nurse Practitioners) who all work together to provide you with the care you need, when you need it.  Your next appointment:   12 month(s)  The format for your next appointment:   In Person  Provider:   Eleonore Chiquito, MD

## 2019-08-02 ENCOUNTER — Other Ambulatory Visit (HOSPITAL_COMMUNITY): Payer: Medicare Other

## 2019-08-07 DIAGNOSIS — Z933 Colostomy status: Secondary | ICD-10-CM | POA: Diagnosis not present

## 2019-08-07 DIAGNOSIS — K5090 Crohn's disease, unspecified, without complications: Secondary | ICD-10-CM | POA: Diagnosis not present

## 2019-08-17 ENCOUNTER — Other Ambulatory Visit: Payer: Self-pay

## 2019-08-17 ENCOUNTER — Ambulatory Visit (HOSPITAL_COMMUNITY): Payer: Medicare Other | Attending: Cardiology

## 2019-08-17 DIAGNOSIS — E782 Mixed hyperlipidemia: Secondary | ICD-10-CM | POA: Diagnosis not present

## 2019-08-17 DIAGNOSIS — I5022 Chronic systolic (congestive) heart failure: Secondary | ICD-10-CM | POA: Diagnosis not present

## 2019-08-17 DIAGNOSIS — R0782 Intercostal pain: Secondary | ICD-10-CM

## 2019-08-17 DIAGNOSIS — I10 Essential (primary) hypertension: Secondary | ICD-10-CM | POA: Insufficient documentation

## 2019-08-17 DIAGNOSIS — I517 Cardiomegaly: Secondary | ICD-10-CM | POA: Insufficient documentation

## 2019-08-18 DIAGNOSIS — K5090 Crohn's disease, unspecified, without complications: Secondary | ICD-10-CM | POA: Diagnosis not present

## 2019-08-18 DIAGNOSIS — Z933 Colostomy status: Secondary | ICD-10-CM | POA: Diagnosis not present

## 2019-09-12 ENCOUNTER — Encounter: Payer: Self-pay | Admitting: *Deleted

## 2019-09-12 DIAGNOSIS — K5090 Crohn's disease, unspecified, without complications: Secondary | ICD-10-CM | POA: Diagnosis not present

## 2019-09-12 DIAGNOSIS — Z933 Colostomy status: Secondary | ICD-10-CM | POA: Diagnosis not present

## 2019-09-13 ENCOUNTER — Ambulatory Visit (INDEPENDENT_AMBULATORY_CARE_PROVIDER_SITE_OTHER): Payer: Medicare Other | Admitting: Diagnostic Neuroimaging

## 2019-09-13 ENCOUNTER — Encounter: Payer: Self-pay | Admitting: Diagnostic Neuroimaging

## 2019-09-13 ENCOUNTER — Other Ambulatory Visit: Payer: Self-pay

## 2019-09-13 VITALS — BP 120/75 | HR 106 | Temp 97.6°F | Ht 66.0 in | Wt 117.8 lb

## 2019-09-13 DIAGNOSIS — M79641 Pain in right hand: Secondary | ICD-10-CM | POA: Diagnosis not present

## 2019-09-13 DIAGNOSIS — Z933 Colostomy status: Secondary | ICD-10-CM | POA: Diagnosis not present

## 2019-09-13 DIAGNOSIS — K5090 Crohn's disease, unspecified, without complications: Secondary | ICD-10-CM | POA: Diagnosis not present

## 2019-09-13 NOTE — Progress Notes (Signed)
[facility]  PATIENT: [name] [name] DOB: Jul 04, 1951  REFERRING CLINICIAN: Daryll Brod, MD HISTORY FROM: patient  REASON FOR VISIT: new consult    HISTORICAL  CHIEF COMPLAINT:  Chief Complaint  Patient presents with  . Numbness/tingling R hand    rm 7 New Pt "right hand issues x 2 years, now my right knee gets a pressure type feeling in it, a catch in it"    HISTORY OF PRESENT ILLNESS:   69 year old female with hypertension, hyperlipidemia, coronary artery disease, CHF, chronic kidney disease, tobacco abuse, here for evaluation of right hand numbness.  Symptoms started around 2017 or 2018, went to hand surgeon, spine surgeon, diagnosed with severe cervical spinal stenosis and myelopathy, status post ACDF in July 2020.  Following this patient continued to have right hand numbness and pain.  Patient has had 2 EMG nerve conduction studies which were normal.  Patient was told that this may be sequelae of her cervical spine disease.  Patient referred here for further evaluation.  Patient cannot take NSAIDs due to chronic kidney disease.  Apparently she has an allergy to gabapentin.  She was previously on citalopram for depression but this stopped this.  She has not tried Cymbalta or Lyrica.  She is reluctant to try stronger pain medications because they may be habit-forming.   REVIEW OF SYSTEMS: Full 14 system review of systems performed and negative with exception of: As per HPI.  ALLERGIES: Allergies  Allergen Reactions  . Amoxicillin Anaphylaxis, Rash and Other (See Comments)    Throat Swelling  . Aspirin Anaphylaxis, Itching and Rash  . Morphine And Related Anaphylaxis  . Penicillins Anaphylaxis and Rash  . Gabapentin Other (See Comments)    Patient had one time seizure shortly after stopping gabapentin  . Ciprofloxacin Swelling    Possible reaction to cipro or clindamycin 04/29/14  . Ace Inhibitors Other (See Comments) and Cough    ACE possibly associated  with cough and switched to ARB.  Would be OK to re-challenge if needed.    HOME MEDICATIONS: Outpatient Medications Prior to Visit  Medication Sig Dispense Refill  . ascorbic acid (VITAMIN C) 500 MG tablet Take by mouth.    Marland Kitchen atorvastatin (LIPITOR) 40 MG tablet Take 1 tablet (40 mg total) by mouth daily. Stop taking simvastatin. 90 tablet 3  . calcium citrate (CALCITRATE - DOSED IN MG ELEMENTAL CALCIUM) 950 (200 Ca) MG tablet Take by mouth daily.    . cholecalciferol (VITAMIN D) 1000 units tablet Take 1,000 Units by mouth daily.    . clopidogrel (PLAVIX) 75 MG tablet Take 1 tablet (75 mg total) by mouth daily. 90 tablet 3  . Ferrous Fumarate 86 (27 Fe) MG CAPS Take by mouth daily.    Marland Kitchen LORazepam (ATIVAN) 0.5 MG tablet Take 1 tablet (0.5 mg total) by mouth 2 (two) times daily as needed. for anxiety 60 tablet 5  . Mesalamine 800 MG TBEC TAKE ONE TABLET BY MOUTH THREE TIMES DAILY 270 tablet 3  . Multiple Vitamin (MULTIVITAMIN WITH MINERALS) TABS tablet Take 1 tablet by mouth 2 (two) times daily.    . Multiple Vitamin (MULTIVITAMIN) tablet Take 1 tablet by mouth 2 (two) times daily.    Marland Kitchen triamcinolone ointment (KENALOG) 0.1 % Apply 1 application topically 2 (two) times daily. Apply to skin around stoma 80 g 6  . simvastatin (ZOCOR) 20 MG tablet daily.     No facility-administered medications prior to visit.    PAST MEDICAL HISTORY: Past Medical History:  Diagnosis Date  . Abnormal chest CT    Coronary atherosclerosis on chest CT 2012  . Anemia, chronic renal failure   . Anxiety   . Asthma 05/2011  . CAD (coronary artery disease)   . Carpal tunnel syndrome   . CHF (congestive heart failure) (HCC)    EF 30-35% 2012->EF 60-65% 2013  . CKD (chronic kidney disease), stage III   . Crohn's disease ([place])   . GERD (gastroesophageal reflux disease)   . Headache(784.0)    "related to high BP" (05/30/2012)  . 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 Hyperlipidemia   . Hypertension   .  Osteopenia   . Panic attacks   . Reflux esophagitis   . Seizure ([place])    hx of  . Tobacco abuse   . Vitamin D deficiency     PAST SURGICAL HISTORY: Past Surgical History:  Procedure Laterality Date  . CATARACT EXTRACTION W/ INTRAOCULAR LENS  IMPLANT, BILATERAL  ~ 2000  . CHOLECYSTECTOMY  01/28/2005  . COLOSTOMY  03/1996   diverting  . ECTOPIC PREGNANCY SURGERY  ?1980's   left  . ILEOSTOMY  ?  2002  . NECK SURGERY  2020   "pinched nerve"    FAMILY HISTORY: Family History  Problem Relation Age of Onset  . Heart disease Mother   . Other Mother        Covid  . Stroke Father   . Other Sister        AIDS    SOCIAL HISTORY: Social History   Socioeconomic History  . Marital status: Single    Spouse name: Not on file  . Number of children: 1  . Years of education: associate  . Highest education level: Not on file  Occupational History  . Occupation: Retired-cook    Employer: [employer]  Tobacco Use  . Smoking status: Current Every Day Smoker    Packs/day: 0.12    Years: 18.00    Pack years: 2.16    Types: Cigarettes  . Smokeless tobacco: Never Used  Substance and Sexual Activity  . Alcohol use: No    Comment: 05/30/2012 "used to drink back in the day; last alcohol 23 yr ago"  . Drug use: No  . Sexual activity: Not Currently  Other Topics Concern  . Not on file  Social History Narrative   Health Care POA:    Emergency Contact: partner, Raj Janus, [phone]   End of Life Plan:    Who lives with you: no one 09/13/19, no pets   Any pets: cat- Wild Thing   Diet: pt has a varied diet of protein, starch, and vegetables    Exercise: Pt does not have any regular exercise routine   Seatbelts:Pt reports wearing seatbelt when in vehicles.    Nancy Fetter Exposure/Protection:    Hobbies: cooking, bingo, basketball         Social Determinants of Radio broadcast assistant Strain:   . Difficulty of Paying Living Expenses: Not on file  Food Insecurity:   .  Worried About Charity fundraiser in the Last Year: Not on file  . Ran Out of Food in the Last Year: Not on file  Transportation Needs:   . Lack of Transportation (Medical): Not on file  . Lack of Transportation (Non-Medical): Not on file  Physical Activity:   . Days of Exercise per Week: Not on file  . Minutes of Exercise per Session: Not on file  Stress:   . Feeling  of Stress : Not on file  Social Connections:   . Frequency of Communication with Friends and Family: Not on file  . Frequency of Social Gatherings with Friends and Family: Not on file  . Attends Religious Services: Not on file  . Active Member of Clubs or Organizations: Not on file  . Attends Archivist Meetings: Not on file  . Marital Status: Not on file  Intimate Partner Violence:   . Fear of Current or Ex-Partner: Not on file  . Emotionally Abused: Not on file  . Physically Abused: Not on file  . Sexually Abused: Not on file     PHYSICAL EXAM  GENERAL EXAM/CONSTITUTIONAL: Vitals:  Vitals:   09/13/19 0804  BP: 120/75  Pulse: (!) 106  Temp: 97.6 F (36.4 C)  Weight: 117 lb 12.8 oz (53.4 kg)  Height: 5' 6"  (1.676 m)     Body mass index is 19.01 kg/m. Wt Readings from Last 3 Encounters:  09/13/19 117 lb 12.8 oz (53.4 kg)  07/25/19 120 lb 9.6 oz (54.7 kg)  06/14/19 131 lb (59.4 kg)     Patient is in no distress; well developed, nourished and groomed; neck is supple  CARDIOVASCULAR:  Examination of carotid arteries is normal; no carotid bruits  Regular rate and rhythm, no murmurs  Examination of peripheral vascular system by observation and palpation is normal  EYES:  Ophthalmoscopic exam of optic discs and posterior segments is normal; no papilledema or hemorrhages  No exam data present  MUSCULOSKELETAL:  Gait, strength, tone, movements noted in Neurologic exam below  NEUROLOGIC: MENTAL STATUS:  MMSE - Mini Mental State Exam 09/23/2011  Orientation to time 5  Orientation to  Place 5  Registration 3  Attention/ Calculation 5  Recall 3  Language- name 2 objects 2  Language- repeat 1  Language- follow 3 step command 3  Language- read & follow direction 1  Write a sentence 1  Copy design 1  Total score 30    awake, alert, oriented to person, place and time  recent and remote memory intact  normal attention and concentration  language fluent, comprehension intact, naming intact  fund of knowledge appropriate  CRANIAL NERVE:   2nd - no papilledema on fundoscopic exam  2nd, 3rd, 4th, 6th - pupils equal and reactive to light, visual fields full to confrontation, extraocular muscles intact, no nystagmus  5th - facial sensation symmetric  7th - facial strength symmetric  8th - hearing intact  9th - palate elevates symmetrically, uvula midline  11th - shoulder shrug symmetric  12th - tongue protrusion midline  MOTOR:   normal bulk and tone, full strength in the BUE, BLE; EXCEPT BILATERAL UPPER EXT 4+  ATROPHY OF BILATERAL APB  SENSORY:   normal and symmetric to light touch; EXCEPT DECR IN RIGHT HAND AND RIGHT LEG  COORDINATION:   finger-nose-finger, fine finger movements normal  REFLEXES:   deep tendon reflexes TRACE and symmetric  GAIT/STATION:   narrow based gait     DIAGNOSTIC DATA (LABS, IMAGING, TESTING) - I reviewed patient records, labs, notes, testing and imaging myself where available.  Lab Results  Component Value Date   WBC 5.8 06/14/2019   HGB 10.1 (L) 06/14/2019   HCT 30.5 (L) 06/14/2019   MCV 96 06/14/2019   PLT 188 06/14/2019      Component Value Date/Time   NA 142 06/14/2019 1553   K 4.7 06/14/2019 1553   CL 114 (H) 06/14/2019 1553   CO2 16 (  L) 06/14/2019 1553   GLUCOSE 86 06/14/2019 1553   GLUCOSE 82 04/12/2017 1637   BUN 20 06/14/2019 1553   CREATININE 2.05 (H) 06/14/2019 1553   CREATININE 1.74 (H) 09/19/2014 1620   CALCIUM 9.3 06/14/2019 1553   CALCIUM 7.4 (L) 05/17/2013 1405   PROT 7.5  06/14/2019 1553   ALBUMIN 4.3 06/14/2019 1553   AST 24 06/14/2019 1553   ALT 16 06/14/2019 1553   ALKPHOS 134 (H) 06/14/2019 1553   BILITOT 0.3 06/14/2019 1553   GFRNONAA 25 (L) 06/14/2019 1553   GFRNONAA 24 (L) 09/04/2011 1040   GFRAA 28 (L) 06/14/2019 1553   GFRAA 28 (L) 09/04/2011 1040   Lab Results  Component Value Date   CHOL 183 06/14/2019   HDL 55 06/14/2019   LDLCALC 106 (H) 06/14/2019   LDLDIRECT 129 (H) 07/23/2006   TRIG 121 06/14/2019   CHOLHDL 3.3 06/14/2019   Lab Results  Component Value Date   HGBA1C (H) 11/07/2010    5.7 (NOTE)                                                                       According to the ADA Clinical Practice Recommendations for 2011, when HbA1c is used as a screening test:   >=6.5%   Diagnostic of Diabetes Mellitus           (if abnormal result  is confirmed)  5.7-6.4%   Increased risk of developing Diabetes Mellitus  References:Diagnosis and Classification of Diabetes Mellitus,Diabetes BVQX,4503,88(EKCMK 1):S62-S69 and Standards of Medical Care in         Diabetes - 2011,Diabetes Care,2011,34  (Suppl 1):S11-S61.   Lab Results  Component Value Date   VITAMINB12 402 10/09/2009   Lab Results  Component Value Date   TSH 0.531 11/24/2017    02/10/18 NCV/EMG (Dr. Posey Pronto) - This is limited study, as testing was prematurely terminated at patient's request due to pain. Nerve conduction study of the ulnar nerve and electromyography was not performed. - There is no evidence of right carpal tunnel syndrome.  04/12/17 MRI brain  1. Truncated, motion degraded examination without acute intracranial abnormality. 2. White matter hyperintensities most commonly indicating chronic ischemic microangiopathy.  12/28/18 MRI cervical spine 1. Large soft disc extrusion at C3-4 severely compressing the cervical spinal cord with myelopathy. 2. Small disc protrusions at C4-5 and C5-6 with slight indentation upon the ventral aspect of the spinal cord at C4-5  centrally. 3. Critical Value/emergent results were called by telephone at the time of interpretation on 12/28/2018 at 4:29 pm to Raquel Sarna, [place] at [facility] office , who verbally acknowledged these results.  12/28/18 MRI thoracic spine - Focal hypertrophy of the ligamentum flavum T9-10 without neural impingement. - Diffuse atrophy of the thoracic spinal cord from T3-4 through T8-9.  This may be due to the severe compression cervical spinal cord at C3-4.  07/19/19 EMG/NCS (Dr. Dione Booze) - normal   ASSESSMENT AND PLAN  69 y.o. year old female here with:  Dx:  1. Right hand pain      PLAN:  RIGHT HAND NUMBNESS (sequelae due to cervical spinal stenosis + cervical radiculopathy; s/p ACDF; cannot take NSAIDs or gabapentin) - consider pain mgmt clinic evaluation; may discuss with PCP (Dr. Andria Frames)  and spine surgeon (Dr. Brigitte Pulse)  Return for return to PCP, pending if symptoms worsen or fail to improve.    Penni Bombard, MD 5/49/8264, 1:58 AM Certified in Neurology, Neurophysiology and Neuroimaging  [facility] [address] [place], [place] [address] [phone]

## 2019-09-13 NOTE — Patient Instructions (Signed)
RIGHT HAND NUMBNESS (due to spinal cord and nerve root compression in the neck) - consider pain mgmt clinic evaluation; may discuss with PCP (Dr. Andria Frames) and spine surgeon (Dr. Brigitte Pulse)

## 2019-09-14 NOTE — Progress Notes (Signed)
Cardiology Office Note:   Date:  09/15/2019  NAME:  [name] [name]    MRN: [id number] DOB:  1950-12-03   PCP:  Zenia Resides, MD  Cardiologist:  Evalina Field, MD   Referring MD: Zenia Resides, MD   Chief Complaint  Patient presents with  . Congestive Heart Failure   History of Present Illness:   [name] [name] is a 69 y.o. female with a hx of systolic HF, CKD IV, HLD, Crohn's disease who presents for follow-up of systolic HF. EF 25% in 2012 and had recovery of function on medications. Seen in follow-up and was without symptoms. TTE shows EF 25-30%.  She presents without symptoms.  She reports she is able to walk up to 1 mile without any chest pain or shortness of breath.  Her main issue has been pain in her back.  She does continue to smoke and has smoked for nearly 30 years.  She reports she smokes 4 to 5 cigarettes/day.  She takes Plavix due to an aspirin allergy.  I suspect the reason her heart function has declined and she has been off all medications.  She has significant CKD we will not be able to prescribe ACE/ARB/Arni therapy.  She will be a good candidate for BiDil.  She reports no lower extremity edema.  She describes no symptoms of orthopnea or PND.  She appears to be relatively asymptomatic for the level of her LV dysfunction.  Problem List 1. Systolic HF, EF 40-98% -EF recovered in past 35% -> 60%, now back to 30% -normal MPI -EF 25-30% 07/2019 2. CKD IV -EGFR 28 3. Hyperlipidemia total cholesterol  -183, HDL 55, LDL 106, triglycerides 121  Past Medical History: Past Medical History:  Diagnosis Date  . Abnormal chest CT    Coronary atherosclerosis on chest CT 2012  . Anemia, chronic renal failure   . Anxiety   . Asthma 05/2011  . CAD (coronary artery disease)   . Carpal tunnel syndrome   . CHF (congestive heart failure) (HCC)    EF 30-35% 2012->EF 60-65% 2013  . CKD (chronic kidney disease), stage III   . Crohn's disease ([place])   . GERD (gastroesophageal  reflux disease)   . Headache(784.0)    "related to high BP" (05/30/2012)  . 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 Hyperlipidemia   . Hypertension   . Osteopenia   . Panic attacks   . Reflux esophagitis   . Seizure ([place])    hx of  . Tobacco abuse   . Vitamin D deficiency     Past Surgical History: Past Surgical History:  Procedure Laterality Date  . CATARACT EXTRACTION W/ INTRAOCULAR LENS  IMPLANT, BILATERAL  ~ 2000  . CHOLECYSTECTOMY  01/28/2005  . COLOSTOMY  03/1996   diverting  . ECTOPIC PREGNANCY SURGERY  ?1980's   left  . ILEOSTOMY  ?  2002  . NECK SURGERY  2020   "pinched nerve"    Current Medications: Current Meds  Medication Sig  . ascorbic acid (VITAMIN C) 500 MG tablet Take by mouth.  Marland Kitchen atorvastatin (LIPITOR) 40 MG tablet Take 1 tablet (40 mg total) by mouth daily. Stop taking simvastatin.  . calcium citrate (CALCITRATE - DOSED IN MG ELEMENTAL CALCIUM) 950 (200 Ca) MG tablet Take by mouth daily.  . cholecalciferol (VITAMIN D) 1000 units tablet Take 1,000 Units by mouth daily.  . clopidogrel (PLAVIX) 75 MG tablet Take 1 tablet (75 mg total) by mouth daily.  . Ferrous Fumarate  86 (27 Fe) MG CAPS Take by mouth daily.  Marland Kitchen LORazepam (ATIVAN) 0.5 MG tablet Take 1 tablet (0.5 mg total) by mouth 2 (two) times daily as needed. for anxiety  . Mesalamine 800 MG TBEC TAKE ONE TABLET BY MOUTH THREE TIMES DAILY  . Multiple Vitamin (MULTIVITAMIN WITH MINERALS) TABS tablet Take 1 tablet by mouth 2 (two) times daily.  . Multiple Vitamin (MULTIVITAMIN) tablet Take 1 tablet by mouth 2 (two) times daily.  Marland Kitchen triamcinolone ointment (KENALOG) 0.1 % Apply 1 application topically 2 (two) times daily. Apply to skin around stoma     Allergies:    Amoxicillin, Aspirin, Morphine and related, Penicillins, Gabapentin, Ciprofloxacin, and Ace inhibitors   Social History: Social History   Socioeconomic History  . Marital status: Single    Spouse name: Not on file  . Number of children: 1    . Years of education: associate  . Highest education level: Not on file  Occupational History  . Occupation: Retired-cook    Employer: [employer]  Tobacco Use  . Smoking status: Current Every Day Smoker    Packs/day: 0.12    Years: 30.00    Pack years: 3.60    Types: Cigarettes  . Smokeless tobacco: Never Used  Substance and Sexual Activity  . Alcohol use: No    Comment: 05/30/2012 "used to drink back in the day; last alcohol 23 yr ago"  . Drug use: No  . Sexual activity: Not Currently  Other Topics Concern  . Not on file  Social History Narrative   Health Care POA:    Emergency Contact: partner, Raj Janus, [phone]   End of Life Plan:    Who lives with you: no one 09/13/19, no pets   Any pets: cat- Wild Thing   Diet: pt has a varied diet of protein, starch, and vegetables    Exercise: Pt does not have any regular exercise routine   Seatbelts:Pt reports wearing seatbelt when in vehicles.    Nancy Fetter Exposure/Protection:    Hobbies: cooking, bingo, basketball         Social Determinants of Radio broadcast assistant Strain:   . Difficulty of Paying Living Expenses:   Food Insecurity:   . Worried About Charity fundraiser in the Last Year:   . Arboriculturist in the Last Year:   Transportation Needs:   . Film/video editor (Medical):   Marland Kitchen Lack of Transportation (Non-Medical):   Physical Activity:   . Days of Exercise per Week:   . Minutes of Exercise per Session:   Stress:   . Feeling of Stress :   Social Connections:   . Frequency of Communication with Friends and Family:   . Frequency of Social Gatherings with Friends and Family:   . Attends Religious Services:   . Active Member of Clubs or Organizations:   . Attends Archivist Meetings:   Marland Kitchen Marital Status:      Family History: The patient's family history includes Heart disease in her mother; Other in her mother and sister; Stroke in her father.  ROS:   All other ROS reviewed and  negative. Pertinent positives noted in the HPI.     EKGs/Labs/Other Studies Reviewed:   The following studies were personally reviewed by me today:  EKG:  EKG is ordered today.  The ekg ordered today demonstrates sinus tachycardia, heart rate 106, no acute ischemic changes, no evidence of prior infarction, and was personally reviewed by me.  TTE 08/17/2019 1. Severe global reduction in LV systolic function; grade 1 diastolic  dysfunction; mild AI; moderate, eccentric, posteriorly directed MR  suggesting prolapse of anterior MV leaflet; mild LAE.  2. Left ventricular ejection fraction, by estimation, is 25 to 30%. The  left ventricle has severely decreased function. The left ventrical  demonstrates global hypokinesis. Left ventricular diastolic parameters are  consistent with Grade I diastolic  dysfunction (impaired relaxation). Elevated left atrial pressure.  3. Right ventricular systolic function is normal. The right ventricular  size is normal.  4. Left atrial size was mildly dilated.  5. The mitral valve is normal in structure and function. Moderate mitral  valve regurgitation. No evidence of mitral stenosis.  6. The aortic valve is tricuspid. Aortic valve regurgitation is mild.  Mild to moderate aortic valve sclerosis/calcification is present, without  any evidence of aortic stenosis.  7. The inferior vena cava is normal in size with greater than 50%  respiratory variability, suggesting right atrial pressure of 3 mmHg.   Recent Labs: 06/14/2019: ALT 16; BUN 20; Creatinine, Ser 2.05; Hemoglobin 10.1; Platelets 188; Potassium 4.7; Sodium 142   Recent Lipid Panel    Component Value Date/Time   CHOL 183 06/14/2019 1553   TRIG 121 06/14/2019 1553   HDL 55 06/14/2019 1553   CHOLHDL 3.3 06/14/2019 1553   CHOLHDL 3.9 03/19/2016 1200   VLDL 36 (H) 03/19/2016 1200   LDLCALC 106 (H) 06/14/2019 1553   LDLDIRECT 129 (H) 07/23/2006 2031    Physical Exam:   VS:  BP (!) 142/80    Pulse (!) 106   Ht 5' 6"  (1.676 m)   Wt 118 lb 3.2 oz (53.6 kg)   SpO2 100%   BMI 19.08 kg/m    Wt Readings from Last 3 Encounters:  09/15/19 118 lb 3.2 oz (53.6 kg)  09/13/19 117 lb 12.8 oz (53.4 kg)  07/25/19 120 lb 9.6 oz (54.7 kg)    General: Well nourished, well developed, in no acute distress Heart: Atraumatic, normal size  Eyes: PEERLA, EOMI  Neck: Supple, no JVD Endocrine: No thryomegaly Cardiac: Normal S1, S2; RRR; no murmurs, rubs, or gallops Lungs: Clear to auscultation bilaterally, no wheezing, rhonchi or rales  Abd: Soft, nontender, no hepatomegaly  Ext: No edema, pulses 2+ Musculoskeletal: No deformities, BUE and BLE strength normal and equal Skin: Warm and dry, no rashes   Neuro: Alert and oriented to person, place, time, and situation, CNII-XII grossly intact, no focal deficits  Psych: Normal mood and affect   ASSESSMENT:   [name] [name] is a 69 y.o. female who presents for the following: 1. Chronic systolic heart failure ([place])   2. Nonrheumatic mitral valve regurgitation   3. Essential hypertension   4. Mixed hyperlipidemia   5. CKD (chronic kidney disease) stage 4, GFR 15-29 ml/min (HCC)   6. Tobacco abuse     PLAN:   1. Chronic systolic heart failure ([place]) -She had systolic heart failure in 2012 down to 35% and ejection fraction recovered with medication of to 60% the following year.  She had a negative stress test at that time.  This is attributed to viral myocarditis.  She never underwent cardiac MRI.  Ejection fraction is back down 25-30%.  She has global hypokinesis.  We will go ahead and start her on carvedilol 12.5 mg twice daily as well as BiDil 20-37.5 mg 3 times daily.  She is not a candidate for ACE/ARB/Arni due to significant CKD.  She has no symptoms surprisingly.  She can walk up to 1 mile without limitations of chest pain or shortness of breath.  She describes no orthopnea, PND, lower extremity edema.  She will not need Lasix moving forward.  She  is euvolemic on examination today.  We will also repeat a Lexiscan nuclear medicine stress test to exclude significant coronary artery disease.  She does have considerable risk factors of CKD as well as tobacco abuse.  2. Nonrheumatic mitral valve regurgitation -Moderate on recent echocardiogram.  No real murmur of mitral vegetation.  Suspect this is all functional.  We will treat her heart failure and continue to follow this.  3. Essential hypertension -We are starting antihypertensives Coreg and BiDil today.  This will treat her heart failure as well.  4. Mixed hyperlipidemia -She will continue her Lipitor 40 mg daily  5. CKD (chronic kidney disease) stage 4, GFR 15-29 ml/min (HCC) -We will be cognizant of her CKD as we move forward with medications.  6. Tobacco abuse -Counseled on the importance of smoking cessation today  Disposition: Return in about 3 months (around 12/16/2019).  Medication Adjustments/Labs and Tests Ordered: Current medicines are reviewed at length with the patient today.  Concerns regarding medicines are outlined above.  Orders Placed This Encounter  Procedures  . CBC  . TSH  . MYOCARDIAL PERFUSION IMAGING   Meds ordered this encounter  Medications  . isosorbide-hydrALAZINE (BIDIL) 20-37.5 MG tablet    Sig: Take 1 tablet by mouth 3 (three) times daily.    Dispense:  360 tablet    Refill:  1  . carvedilol (COREG) 12.5 MG tablet    Sig: Take 1 tablet (12.5 mg total) by mouth 2 (two) times daily.    Dispense:  180 tablet    Refill:  3    Patient Instructions  Medication Instructions:  Start Bidil 20-37.5 mg three times a day  Start Coreg 12.5 mg twice daily   *If you need a refill on your cardiac medications before your next appointment, please call your pharmacy*   Lab Work: CBC, TSH today   If you have labs (blood work) drawn today and your tests are completely normal, you will receive your results only by: Marland Kitchen MyChart Message (if you have  MyChart) OR . A paper copy in the mail If you have any lab test that is abnormal or we need to change your treatment, we will call you to review the results.   Testing/Procedures: Your physician has requested that you have a lexiscan myoview. A cardiac stress test is a cardiological test that measures the heart's ability to respond to external stress in a controlled clinical environment. The stress response is induced by intravenous pharmacological stimulation.    Follow-Up: At [facility], you and your health needs are our priority.  As part of our continuing mission to provide you with exceptional heart care, we have created designated Provider Care Teams.  These Care Teams include your primary Cardiologist (physician) and Advanced Practice Providers (APPs -  Physician Assistants and Nurse Practitioners) who all work together to provide you with the care you need, when you need it.  We recommend signing up for the patient portal called "MyChart".  Sign up information is provided on this After Visit Summary.  MyChart is used to connect with patients for Virtual Visits (Telemedicine).  Patients are able to view lab/test results, encounter notes, upcoming appointments, etc.  Non-urgent messages can be sent to your provider as well.   To learn more about what  you can do with MyChart, go to [email or web address].    Your next appointment:   3 month(s)  The format for your next appointment:   In Person  Provider:   Eleonore Chiquito, MD      Time Spent with Patient: I have spent a total of 35 minutes with patient reviewing hospital notes, telemetry, EKGs, labs and examining the patient as well as establishing an assessment and plan that was discussed with the patient.  > 50% of time was spent in direct patient care.  Signed, Addison Naegeli. Audie Box, [place]  [address], [place], [place] [address] [phone]  09/15/2019 11:28 AM

## 2019-09-15 ENCOUNTER — Encounter: Payer: Self-pay | Admitting: Cardiovascular Disease

## 2019-09-15 ENCOUNTER — Other Ambulatory Visit: Payer: Self-pay

## 2019-09-15 ENCOUNTER — Ambulatory Visit (INDEPENDENT_AMBULATORY_CARE_PROVIDER_SITE_OTHER): Payer: Medicare Other | Admitting: Cardiovascular Disease

## 2019-09-15 VITALS — BP 142/80 | HR 106 | Ht 66.0 in | Wt 118.2 lb

## 2019-09-15 DIAGNOSIS — I340 Nonrheumatic mitral (valve) insufficiency: Secondary | ICD-10-CM

## 2019-09-15 DIAGNOSIS — E782 Mixed hyperlipidemia: Secondary | ICD-10-CM

## 2019-09-15 DIAGNOSIS — I10 Essential (primary) hypertension: Secondary | ICD-10-CM

## 2019-09-15 DIAGNOSIS — N184 Chronic kidney disease, stage 4 (severe): Secondary | ICD-10-CM | POA: Diagnosis not present

## 2019-09-15 DIAGNOSIS — I5022 Chronic systolic (congestive) heart failure: Secondary | ICD-10-CM | POA: Diagnosis not present

## 2019-09-15 DIAGNOSIS — Z720 Tobacco use: Secondary | ICD-10-CM

## 2019-09-15 LAB — CBC
Hematocrit: 28.4 % — ABNORMAL LOW (ref 34.0–46.6)
Hemoglobin: 9.4 g/dL — ABNORMAL LOW (ref 11.1–15.9)
MCH: 31.9 pg (ref 26.6–33.0)
MCHC: 33.1 g/dL (ref 31.5–35.7)
MCV: 96 fL (ref 79–97)
Platelets: 170 10*3/uL (ref 150–450)
RBC: 2.95 x10E6/uL — ABNORMAL LOW (ref 3.77–5.28)
RDW: 13.2 % (ref 11.7–15.4)
WBC: 5.5 10*3/uL (ref 3.4–10.8)

## 2019-09-15 LAB — TSH: TSH: 0.851 u[IU]/mL (ref 0.450–4.500)

## 2019-09-15 MED ORDER — CARVEDILOL 12.5 MG PO TABS: 12.5000 mg | ORAL_TABLET | Freq: Two times a day (BID) | ORAL | 3 refills | Status: DC

## 2019-09-15 MED ORDER — BIDIL 20-37.5 MG PO TABS: 1.0000 | ORAL_TABLET | Freq: Three times a day (TID) | ORAL | 1 refills | Status: DC

## 2019-09-15 NOTE — Patient Instructions (Signed)
Medication Instructions:  Start Bidil 20-37.5 mg three times a day  Start Coreg 12.5 mg twice daily   *If you need a refill on your cardiac medications before your next appointment, please call your pharmacy*   Lab Work: CBC, TSH today   If you have labs (blood work) drawn today and your tests are completely normal, you will receive your results only by: Marland Kitchen MyChart Message (if you have MyChart) OR . A paper copy in the mail If you have any lab test that is abnormal or we need to change your treatment, we will call you to review the results.   Testing/Procedures: Your physician has requested that you have a lexiscan myoview. A cardiac stress test is a cardiological test that measures the heart's ability to respond to external stress in a controlled clinical environment. The stress response is induced by intravenous pharmacological stimulation.    Follow-Up: At [facility], you and your health needs are our priority.  As part of our continuing mission to provide you with exceptional heart care, we have created designated Provider Care Teams.  These Care Teams include your primary Cardiologist (physician) and Advanced Practice Providers (APPs -  Physician Assistants and Nurse Practitioners) who all work together to provide you with the care you need, when you need it.  We recommend signing up for the patient portal called "MyChart".  Sign up information is provided on this After Visit Summary.  MyChart is used to connect with patients for Virtual Visits (Telemedicine).  Patients are able to view lab/test results, encounter notes, upcoming appointments, etc.  Non-urgent messages can be sent to your provider as well.   To learn more about what you can do with MyChart, go to [email or web address].    Your next appointment:   3 month(s)  The format for your next appointment:   In Person  Provider:   Eleonore Chiquito, MD

## 2019-09-19 ENCOUNTER — Other Ambulatory Visit (INDEPENDENT_AMBULATORY_CARE_PROVIDER_SITE_OTHER): Payer: Medicare Other

## 2019-09-19 DIAGNOSIS — I10 Essential (primary) hypertension: Secondary | ICD-10-CM | POA: Diagnosis not present

## 2019-09-20 ENCOUNTER — Encounter: Payer: Medicare Other | Admitting: Neurology

## 2019-09-20 ENCOUNTER — Telehealth: Payer: Self-pay | Admitting: Cardiovascular Disease

## 2019-09-20 NOTE — Telephone Encounter (Signed)
Patient returning call for lab results.

## 2019-09-20 NOTE — Telephone Encounter (Signed)
Attempted to contact patient back.  Left call back number.

## 2019-09-21 ENCOUNTER — Telehealth (HOSPITAL_COMMUNITY): Payer: Self-pay

## 2019-09-21 NOTE — Telephone Encounter (Signed)
Encounter complete. 

## 2019-09-26 ENCOUNTER — Other Ambulatory Visit: Payer: Self-pay

## 2019-09-26 ENCOUNTER — Ambulatory Visit (HOSPITAL_COMMUNITY)
Admission: RE | Admit: 2019-09-26 | Discharge: 2019-09-26 | Disposition: A | Discharge status: Home / Self Care | Payer: Medicare Other | Source: Ambulatory Visit | Attending: Cardiovascular Disease | Admitting: Cardiovascular Disease

## 2019-09-26 DIAGNOSIS — I5022 Chronic systolic (congestive) heart failure: Secondary | ICD-10-CM | POA: Diagnosis not present

## 2019-09-26 MED ORDER — TECHNETIUM TC 99M TETROFOSMIN IV KIT
30.1000 | PACK | Freq: Once | INTRAVENOUS | Status: AC | PRN
  Administered 2019-09-26: 14:00:00 30.1 via INTRAVENOUS
  Filled 2019-09-26: qty 31

## 2019-09-26 MED ORDER — TECHNETIUM TC 99M TETROFOSMIN IV KIT
10.5000 | PACK | Freq: Once | INTRAVENOUS | Status: AC | PRN
  Administered 2019-09-26: 10.5 via INTRAVENOUS
  Filled 2019-09-26: qty 11

## 2019-09-26 MED ORDER — REGADENOSON 0.4 MG/5ML IV SOLN
0.4000 mg | Freq: Once | INTRAVENOUS | Status: AC
  Administered 2019-09-26: 0.4 mg via INTRAVENOUS

## 2019-09-26 MED ORDER — AMINOPHYLLINE 25 MG/ML IV SOLN
75.0000 mg | Freq: Once | INTRAVENOUS | Status: AC
  Administered 2019-09-26: 75 mg via INTRAVENOUS

## 2019-09-28 LAB — MYOCARDIAL PERFUSION IMAGING
LV dias vol: 128 mL (ref 46–106)
LV sys vol: 83 mL
Peak HR: 121 {beats}/min
Rest HR: 77 {beats}/min
SDS: 2
SRS: 1
SSS: 3
TID: 1.01

## 2019-09-29 NOTE — Telephone Encounter (Signed)
Attempted to contact patient to discuss results. Left call back number- will remove from calls.

## 2019-10-20 ENCOUNTER — Other Ambulatory Visit: Payer: Self-pay | Admitting: Family Medicine

## 2019-10-20 DIAGNOSIS — I2510 Atherosclerotic heart disease of native coronary artery without angina pectoris: Secondary | ICD-10-CM

## 2019-11-01 DIAGNOSIS — R202 Paresthesia of skin: Secondary | ICD-10-CM | POA: Diagnosis not present

## 2019-11-01 DIAGNOSIS — Z720 Tobacco use: Secondary | ICD-10-CM | POA: Diagnosis not present

## 2019-11-01 DIAGNOSIS — M545 Low back pain: Secondary | ICD-10-CM | POA: Diagnosis not present

## 2019-11-01 DIAGNOSIS — Z981 Arthrodesis status: Secondary | ICD-10-CM | POA: Diagnosis not present

## 2019-11-01 DIAGNOSIS — R200 Anesthesia of skin: Secondary | ICD-10-CM | POA: Diagnosis not present

## 2019-11-01 DIAGNOSIS — G8929 Other chronic pain: Secondary | ICD-10-CM | POA: Diagnosis not present

## 2019-11-01 DIAGNOSIS — N1831 Chronic kidney disease, stage 3a: Secondary | ICD-10-CM | POA: Diagnosis not present

## 2019-11-02 ENCOUNTER — Encounter: Payer: Self-pay | Admitting: Family Medicine

## 2019-11-02 DIAGNOSIS — M5382 Other specified dorsopathies, cervical region: Secondary | ICD-10-CM | POA: Insufficient documentation

## 2019-11-06 ENCOUNTER — Telehealth: Payer: Self-pay | Admitting: Family Medicine

## 2019-11-06 DIAGNOSIS — N184 Chronic kidney disease, stage 4 (severe): Secondary | ICD-10-CM

## 2019-11-06 NOTE — Telephone Encounter (Signed)
Attempted to call x 2.  Left VM.  Will try again tomorrow.

## 2019-11-06 NOTE — Telephone Encounter (Signed)
Pt  Called because she was told to see a Nephrologist by her cardiologist . Please call the pt to discuss. jw

## 2019-11-07 NOTE — Telephone Encounter (Signed)
Called patient.  Has been seen by nephrology before for CKD.  Numbers look stable over past 4 years.  Placed referral.  Also asked to come see me.  She has some worsening general health and decreased exercise tolerance.  I don't think we should blame all problems on her kidneys.

## 2019-11-07 NOTE — Telephone Encounter (Signed)
Pt is returning Dr. Mike Craze call. She will be ready today and will be by the phone. jw

## 2019-11-09 DIAGNOSIS — R064 Hyperventilation: Secondary | ICD-10-CM | POA: Diagnosis not present

## 2019-11-09 DIAGNOSIS — W19XXXA Unspecified fall, initial encounter: Secondary | ICD-10-CM | POA: Diagnosis not present

## 2019-11-09 DIAGNOSIS — R42 Dizziness and giddiness: Secondary | ICD-10-CM | POA: Diagnosis not present

## 2019-11-09 DIAGNOSIS — R0689 Other abnormalities of breathing: Secondary | ICD-10-CM | POA: Diagnosis not present

## 2019-11-14 DIAGNOSIS — Z933 Colostomy status: Secondary | ICD-10-CM | POA: Diagnosis not present

## 2019-11-14 DIAGNOSIS — K5090 Crohn's disease, unspecified, without complications: Secondary | ICD-10-CM | POA: Diagnosis not present

## 2019-11-29 DIAGNOSIS — I130 Hypertensive heart and chronic kidney disease with heart failure and stage 1 through stage 4 chronic kidney disease, or unspecified chronic kidney disease: Secondary | ICD-10-CM | POA: Diagnosis not present

## 2019-11-29 DIAGNOSIS — I5022 Chronic systolic (congestive) heart failure: Secondary | ICD-10-CM | POA: Diagnosis not present

## 2019-11-29 DIAGNOSIS — D631 Anemia in chronic kidney disease: Secondary | ICD-10-CM | POA: Diagnosis not present

## 2019-11-29 DIAGNOSIS — N184 Chronic kidney disease, stage 4 (severe): Secondary | ICD-10-CM | POA: Diagnosis not present

## 2019-12-03 NOTE — Progress Notes (Deleted)
Cardiology Office Note:   Date:  12/03/2019  NAME:  [name] [name]    MRN: [id number] DOB:  07/27/1950   PCP:  Zenia Resides, MD  Cardiologist:  Evalina Field, MD  Electrophysiologist:  None   Referring MD: Zenia Resides, MD   No chief complaint on file. ***  History of Present Illness:   [name] [name] is a 69 y.o. female with a hx of CKD IV, HTN, HLD, Systolic HF who presents for follow-up. Recent MPI with fixed defect and concerns for ischemia. Looks artifactual on my review.   Problem List 1. Systolic HF, EF 01-60% -EF recovered in past 35% -> 60%, now back to 30% -normal MPI -EF 25-30% 07/2019 2. CKD IV -EGFR 28 3. Hyperlipidemiatotal cholesterol  -183, HDL 55, LDL 106, triglycerides 121  Past Medical History: Past Medical History:  Diagnosis Date  . Abnormal chest CT    Coronary atherosclerosis on chest CT 2012  . Anemia, chronic renal failure   . Anxiety   . Asthma 05/2011  . CAD (coronary artery disease)   . Carpal tunnel syndrome   . CHF (congestive heart failure) (HCC)    EF 30-35% 2012->EF 60-65% 2013  . CKD (chronic kidney disease), stage III   . Crohn's disease ([place])   . GERD (gastroesophageal reflux disease)   . Headache(784.0)    "related to high BP" (05/30/2012)  . 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 Hyperlipidemia   . Hypertension   . Osteopenia   . Panic attacks   . Reflux esophagitis   . Seizure ([place])    hx of  . Tobacco abuse   . Vitamin D deficiency     Past Surgical History: Past Surgical History:  Procedure Laterality Date  . CATARACT EXTRACTION W/ INTRAOCULAR LENS  IMPLANT, BILATERAL  ~ 2000  . CHOLECYSTECTOMY  01/28/2005  . COLOSTOMY  03/1996   diverting  . ECTOPIC PREGNANCY SURGERY  ?1980's   left  . ILEOSTOMY  ?  2002  . NECK SURGERY  2020   "pinched nerve"    Current Medications: No outpatient medications have been marked as taking for the 12/06/19 encounter (Appointment) with Geralynn Rile, MD.      Allergies:    Amoxicillin, Aspirin, Morphine and related, Penicillins, Gabapentin, Ciprofloxacin, and Ace inhibitors   Social History: Social History   Socioeconomic History  . Marital status: Single    Spouse name: Not on file  . Number of children: 1  . Years of education: associate  . Highest education level: Not on file  Occupational History  . Occupation: Retired-cook    Employer: [employer]  Tobacco Use  . Smoking status: Current Every Day Smoker    Packs/day: 0.12    Years: 30.00    Pack years: 3.60    Types: Cigarettes  . Smokeless tobacco: Never Used  Substance and Sexual Activity  . Alcohol use: No    Comment: 05/30/2012 "used to drink back in the day; last alcohol 23 yr ago"  . Drug use: No  . Sexual activity: Not Currently  Other Topics Concern  . Not on file  Social History Narrative   Health Care POA:    Emergency Contact: partner, Raj Janus, [phone]   End of Life Plan:    Who lives with you: no one 09/13/19, no pets   Any pets: cat- Wild Thing   Diet: pt has a varied diet of protein, starch, and vegetables    Exercise: Pt does  not have any regular exercise routine   Seatbelts:Pt reports wearing seatbelt when in vehicles.    Nancy Fetter Exposure/Protection:    Hobbies: cooking, bingo, basketball         Social Determinants of Radio broadcast assistant Strain:   . Difficulty of Paying Living Expenses:   Food Insecurity:   . Worried About Charity fundraiser in the Last Year:   . Arboriculturist in the Last Year:   Transportation Needs:   . Film/video editor (Medical):   Marland Kitchen Lack of Transportation (Non-Medical):   Physical Activity:   . Days of Exercise per Week:   . Minutes of Exercise per Session:   Stress:   . Feeling of Stress :   Social Connections:   . Frequency of Communication with Friends and Family:   . Frequency of Social Gatherings with Friends and Family:   . Attends Religious Services:   . Active Member of Clubs or  Organizations:   . Attends Archivist Meetings:   Marland Kitchen Marital Status:      Family History: The patient's ***family history includes Heart disease in her mother; Other in her mother and sister; Stroke in her father.  ROS:   All other ROS reviewed and negative. Pertinent positives noted in the HPI.     EKGs/Labs/Other Studies Reviewed:   The following studies were personally reviewed by me today:  EKG:  EKG is *** ordered today.  The ekg ordered today demonstrates ***, and was personally reviewed by me.   TTE 08/17/2019 1. Severe global reduction in LV systolic function; grade 1 diastolic  dysfunction; mild AI; moderate, eccentric, posteriorly directed MR  suggesting prolapse of anterior MV leaflet; mild LAE.  2. Left ventricular ejection fraction, by estimation, is 25 to 30%. The  left ventricle has severely decreased function. The left ventrical  demonstrates global hypokinesis. Left ventricular diastolic parameters are  consistent with Grade I diastolic  dysfunction (impaired relaxation). Elevated left atrial pressure.  3. Right ventricular systolic function is normal. The right ventricular  size is normal.  4. Left atrial size was mildly dilated.  5. The mitral valve is normal in structure and function. Moderate mitral  valve regurgitation. No evidence of mitral stenosis.  6. The aortic valve is tricuspid. Aortic valve regurgitation is mild.  Mild to moderate aortic valve sclerosis/calcification is present, without  any evidence of aortic stenosis.  7. The inferior vena cava is normal in size with greater than 50%  respiratory variability, suggesting right atrial pressure of 3 mmHg.     EKG not interpretable due to resting ST depressions  The left ventricular ejection fraction is moderately decreased (30-44%).  Nuclear stress EF: 35%.  Defect 1: There is a medium defect of mild severity present in the basal anteroseptal, mid anteroseptal, apical anterior  and apex location.  Defect 2: There is a small defect of mild severity present in the basal inferior, mid inferior and apical inferior location.  Findings consistent with ischemia and prior myocardial infarction.  This is an intermediate risk study due to moderately decreased systolic function. Small amount of ischemia   1. Fixed defect consistent with infarct in apex, apical anterior/inferior, and mid to basal anteroseptal    Recent Labs: 06/14/2019: ALT 16; BUN 20; Creatinine, Ser 2.05; Potassium 4.7; Sodium 142 09/15/2019: Hemoglobin 9.4; Platelets 170; TSH 0.851   Recent Lipid Panel    Component Value Date/Time   CHOL 183 06/14/2019 1553   TRIG 121  06/14/2019 1553   HDL 55 06/14/2019 1553   CHOLHDL 3.3 06/14/2019 1553   CHOLHDL 3.9 03/19/2016 1200   VLDL 36 (H) 03/19/2016 1200   LDLCALC 106 (H) 06/14/2019 1553   LDLDIRECT 129 (H) 07/23/2006 2031    Physical Exam:   VS:  There were no vitals taken for this visit.   Wt Readings from Last 3 Encounters:  09/26/19 118 lb (53.5 kg)  09/15/19 118 lb 3.2 oz (53.6 kg)  09/13/19 117 lb 12.8 oz (53.4 kg)    General: Well nourished, well developed, in no acute distress Heart: Atraumatic, normal size  Eyes: PEERLA, EOMI  Neck: Supple, no JVD Endocrine: No thryomegaly Cardiac: Normal S1, S2; RRR; no murmurs, rubs, or gallops Lungs: Clear to auscultation bilaterally, no wheezing, rhonchi or rales  Abd: Soft, nontender, no hepatomegaly  Ext: No edema, pulses 2+ Musculoskeletal: No deformities, BUE and BLE strength normal and equal Skin: Warm and dry, no rashes   Neuro: Alert and oriented to person, place, time, and situation, CNII-XII grossly intact, no focal deficits  Psych: Normal mood and affect   ASSESSMENT:   [name] [name] is a 69 y.o. female who presents for the following: No diagnosis found.  PLAN:   There are no diagnoses linked to this encounter.  Disposition: No follow-ups on file.  Medication Adjustments/Labs and  Tests Ordered: Current medicines are reviewed at length with the patient today.  Concerns regarding medicines are outlined above.  No orders of the defined types were placed in this encounter.  No orders of the defined types were placed in this encounter.   There are no Patient Instructions on file for this visit.   Time Spent with Patient: I have spent a total of *** minutes with patient reviewing hospital notes, telemetry, EKGs, labs and examining the patient as well as establishing an assessment and plan that was discussed with the patient.  > 50% of time was spent in direct patient care.  Signed, Addison Naegeli. Audie Box, [place]  [address], [place], [place] [address] [phone]  12/03/2019 6:56 PM

## 2019-12-06 ENCOUNTER — Ambulatory Visit: Payer: Medicare Other | Admitting: Cardiovascular Disease

## 2019-12-06 DIAGNOSIS — N184 Chronic kidney disease, stage 4 (severe): Secondary | ICD-10-CM | POA: Diagnosis not present

## 2020-01-03 ENCOUNTER — Encounter (HOSPITAL_COMMUNITY): Payer: Self-pay | Admitting: Pediatrics

## 2020-01-03 ENCOUNTER — Emergency Department (HOSPITAL_COMMUNITY)
Admission: EM | Admit: 2020-01-03 | Discharge: 2020-01-04 | Disposition: A | Discharge status: Home / Self Care | Payer: Medicare Other | Attending: Emergency Medicine | Admitting: Emergency Medicine

## 2020-01-03 ENCOUNTER — Other Ambulatory Visit: Payer: Self-pay

## 2020-01-03 DIAGNOSIS — Z743 Need for continuous supervision: Secondary | ICD-10-CM | POA: Diagnosis not present

## 2020-01-03 DIAGNOSIS — N189 Chronic kidney disease, unspecified: Secondary | ICD-10-CM | POA: Diagnosis not present

## 2020-01-03 DIAGNOSIS — I2510 Atherosclerotic heart disease of native coronary artery without angina pectoris: Secondary | ICD-10-CM | POA: Diagnosis not present

## 2020-01-03 DIAGNOSIS — I129 Hypertensive chronic kidney disease with stage 1 through stage 4 chronic kidney disease, or unspecified chronic kidney disease: Secondary | ICD-10-CM | POA: Diagnosis not present

## 2020-01-03 DIAGNOSIS — M542 Cervicalgia: Secondary | ICD-10-CM | POA: Diagnosis not present

## 2020-01-03 DIAGNOSIS — F17210 Nicotine dependence, cigarettes, uncomplicated: Secondary | ICD-10-CM | POA: Insufficient documentation

## 2020-01-03 DIAGNOSIS — Z7901 Long term (current) use of anticoagulants: Secondary | ICD-10-CM | POA: Insufficient documentation

## 2020-01-03 DIAGNOSIS — M5412 Radiculopathy, cervical region: Secondary | ICD-10-CM | POA: Insufficient documentation

## 2020-01-03 DIAGNOSIS — R6889 Other general symptoms and signs: Secondary | ICD-10-CM | POA: Diagnosis not present

## 2020-01-03 DIAGNOSIS — I130 Hypertensive heart and chronic kidney disease with heart failure and stage 1 through stage 4 chronic kidney disease, or unspecified chronic kidney disease: Secondary | ICD-10-CM | POA: Diagnosis not present

## 2020-01-03 DIAGNOSIS — Z79899 Other long term (current) drug therapy: Secondary | ICD-10-CM | POA: Diagnosis not present

## 2020-01-03 DIAGNOSIS — N184 Chronic kidney disease, stage 4 (severe): Secondary | ICD-10-CM | POA: Diagnosis not present

## 2020-01-03 DIAGNOSIS — I509 Heart failure, unspecified: Secondary | ICD-10-CM | POA: Insufficient documentation

## 2020-01-03 NOTE — ED Triage Notes (Signed)
Arrived via EMS; from home, c/o neck pain. S/P neck surgery 5 months. C/O tingling down her spine. Pt stated she ran out of lorazepam on Tuesday.

## 2020-01-04 ENCOUNTER — Emergency Department (HOSPITAL_COMMUNITY): Payer: Medicare Other

## 2020-01-04 DIAGNOSIS — M5412 Radiculopathy, cervical region: Secondary | ICD-10-CM | POA: Diagnosis not present

## 2020-01-04 DIAGNOSIS — M542 Cervicalgia: Secondary | ICD-10-CM | POA: Diagnosis not present

## 2020-01-04 LAB — BASIC METABOLIC PANEL
Anion gap: 8 (ref 5–15)
BUN: 45 mg/dL — ABNORMAL HIGH (ref 8–23)
CO2: 14 mmol/L — ABNORMAL LOW (ref 22–32)
Calcium: 9.4 mg/dL (ref 8.9–10.3)
Chloride: 114 mmol/L — ABNORMAL HIGH (ref 98–111)
Creatinine, Ser: 3.07 mg/dL — ABNORMAL HIGH (ref 0.44–1.00)
GFR calc Af Amer: 18 mL/min — ABNORMAL LOW (ref >60)
GFR calc non Af Amer: 15 mL/min — ABNORMAL LOW (ref >60)
Glucose, Bld: 97 mg/dL (ref 70–99)
Potassium: 4.6 mmol/L (ref 3.5–5.1)
Sodium: 136 mmol/L (ref 135–145)

## 2020-01-04 LAB — CBC
HCT: 31.1 % — ABNORMAL LOW (ref 36.0–46.0)
Hemoglobin: 10.2 g/dL — ABNORMAL LOW (ref 12.0–15.0)
MCH: 32 pg (ref 26.0–34.0)
MCHC: 32.8 g/dL (ref 30.0–36.0)
MCV: 97.5 fL (ref 80.0–100.0)
Platelets: 175 10*3/uL (ref 150–400)
RBC: 3.19 MIL/uL — ABNORMAL LOW (ref 3.87–5.11)
RDW: 13.2 % (ref 11.5–15.5)
WBC: 6 10*3/uL (ref 4.0–10.5)
nRBC: 0 % (ref 0.0–0.2)

## 2020-01-04 MED ORDER — LORAZEPAM 2 MG/ML IJ SOLN: 0.5000 mg | Freq: Once | INTRAMUSCULAR | Status: DC

## 2020-01-04 MED ORDER — LORAZEPAM 1 MG PO TABS
0.5000 mg | ORAL_TABLET | Freq: Once | ORAL | Status: AC
  Administered 2020-01-04: 0.5 mg via ORAL
  Filled 2020-01-04: qty 1

## 2020-01-04 MED ORDER — PREDNISONE 20 MG PO TABS
ORAL_TABLET | ORAL | 0 refills | Status: DC
Start: 2020-01-04 — End: 2020-02-05

## 2020-01-04 MED ORDER — FENTANYL CITRATE (PF) 100 MCG/2ML IJ SOLN
50.0000 ug | Freq: Once | INTRAMUSCULAR | Status: AC
  Administered 2020-01-04: 50 ug via INTRAVENOUS
  Filled 2020-01-04: qty 2

## 2020-01-04 MED ORDER — LORAZEPAM 2 MG/ML IJ SOLN: 1.0000 mg | Freq: Once | INTRAMUSCULAR | Status: DC | PRN

## 2020-01-04 NOTE — ED Notes (Signed)
Patient verbalizes understanding of discharge instructions. Opportunity for questioning and answers were provided. Pt discharged from ED. 

## 2020-01-04 NOTE — ED Provider Notes (Signed)
[facility] EMERGENCY DEPARTMENT Provider Note   CSN: [id number] Arrival date & time: 01/03/20  1759     History Chief Complaint  Patient presents with  . Neck Pain    [name] [name] is a 69 y.o. female.  Patient presents to the emergency department for evaluation of neck pain.  Patient reports that the pain began around 3:30 PM yesterday.  At that time she started to notice pain and inability to find a comfortable position.  Since then the pain has worsened.  She reports numbness of the right arm but this is chronic and unchanged.  She reports that she underwent neck surgery 5 months ago at [facility].  She has not had any recent trauma.  She has not noticed any decreased strength in the upper extremities.  She is feeling very anxious.  She ran out of her lorazepam 2 days ago and cannot get a refill until tomorrow.  She describes the pain as feeling like "a spasm".        Past Medical History:  Diagnosis Date  . Abnormal chest CT    Coronary atherosclerosis on chest CT 2012  . Anemia, chronic renal failure   . Anxiety   . Asthma 05/2011  . CAD (coronary artery disease)   . Carpal tunnel syndrome   . CHF (congestive heart failure) (HCC)    EF 30-35% 2012->EF 60-65% 2013  . CKD (chronic kidney disease), stage III   . Crohn's disease ([place])   . GERD (gastroesophageal reflux disease)   . Headache(784.0)    "related to high BP" (05/30/2012)  . 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 Hyperlipidemia   . Hypertension   . Osteopenia   . Panic attacks   . Reflux esophagitis   . Seizure ([place])    hx of  . Tobacco abuse   . Vitamin D deficiency     Patient Active Problem List   Diagnosis Date Noted  . 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 Right knee pain 11/02/2018  . Eczema, dyshidrotic 05/04/2014  . Hypomagnesemia 07/13/2013  . Unspecified vitamin D deficiency 05/20/2013  . CAD (coronary artery disease) 05/30/2012  . Tobacco  abuse 05/30/2012  . Anemia due to chronic kidney disease 05/30/2012  . Osteopenia 10/05/2011  . Atypical squamous cells of undetermined significance on cytologic smear of cervix (ASC-US) 09/21/2011  . Left renal artery stenosis ([place]) 09/04/2011  . CKD (chronic kidney disease) stage 4, GFR 15-29 ml/min (HCC) 07/30/2011  . History of seizure 03/13/2011  . Hypertension, benign 09/25/2010  . Numbness and tingling in right hand 11/18/2007  . HYPERCHOLESTEROLEMIA 01/10/2007  . Anxiety 09/02/2006  . NEUROPATHY, PERIPHERAL 09/02/2006  . Reflux esophagitis 09/02/2006  . Regional enteritis [facility]) 09/02/2006    Past Surgical History:  Procedure Laterality Date  . CATARACT EXTRACTION W/ INTRAOCULAR LENS  IMPLANT, BILATERAL  ~ 2000  . CHOLECYSTECTOMY  01/28/2005  . COLOSTOMY  03/1996   diverting  . ECTOPIC PREGNANCY SURGERY  ?1980's   left  . ILEOSTOMY  ?  2002  . NECK SURGERY  2020   "pinched nerve"     OB History    Gravida  2   Para  1   Term  1   Preterm  0   AB  1   Living  1     SAB  0   TAB  0   Ectopic  1   Multiple  0   Live Births  Family History  Problem Relation Age of Onset  . Heart disease Mother   . Other Mother        Covid  . Stroke Father   . Other Sister        AIDS    Social History   Tobacco Use  . Smoking status: Current Every Day Smoker    Packs/day: 0.12    Years: 30.00    Pack years: 3.60    Types: Cigarettes  . Smokeless tobacco: Never Used  Substance Use Topics  . Alcohol use: No    Comment: 05/30/2012 "used to drink back in the day; last alcohol 23 yr ago"  . Drug use: No    Home Medications Prior to Admission medications   Medication Sig Start Date End Date Taking? Authorizing Provider  ascorbic acid (VITAMIN C) 500 MG tablet Take by mouth. 01/21/19   [provider]  atorvastatin (LIPITOR) 40 MG tablet Take 1 tablet (40 mg total) by mouth daily. Stop taking simvastatin. 06/15/19   Zenia Resides, MD  calcium citrate (CALCITRATE - DOSED IN MG ELEMENTAL CALCIUM) 950 (200 Ca) MG tablet Take by mouth daily. 01/21/19   [provider]  carvedilol (COREG) 12.5 MG tablet Take 1 tablet (12.5 mg total) by mouth 2 (two) times daily. 09/15/19 12/14/19  O'NealCassie Freer, MD  cholecalciferol (VITAMIN D) 1000 units tablet Take 1,000 Units by mouth daily.    [provider]  clopidogrel (PLAVIX) 75 MG tablet Take 1 tablet (75 mg total) by mouth daily. 10/21/19   Zenia Resides, MD  Ferrous Fumarate 86 (27 Fe) MG CAPS Take by mouth daily.    [provider]  isosorbide-hydrALAZINE (BIDIL) 20-37.5 MG tablet Take 1 tablet by mouth 3 (three) times daily. 09/15/19   O'Neal, Cassie Freer, MD  LORazepam (ATIVAN) 0.5 MG tablet Take 1 tablet (0.5 mg total) by mouth 2 (two) times daily as needed. for anxiety 10/21/19   Zenia Resides, MD  Mesalamine 800 MG TBEC TAKE ONE TABLET BY MOUTH THREE TIMES DAILY 12/20/18   Zenia Resides, MD  Multiple Vitamin (MULTIVITAMIN WITH MINERALS) TABS tablet Take 1 tablet by mouth 2 (two) times daily.    [provider]  Multiple Vitamin (MULTIVITAMIN) tablet Take 1 tablet by mouth 2 (two) times daily.    [provider]  triamcinolone ointment (KENALOG) 0.1 % Apply 1 application topically 2 (two) times daily. Apply to skin around stoma 12/16/18   Zenia Resides, MD    Allergies    Amoxicillin, Aspirin, Morphine and related, Penicillins, Gabapentin, Ciprofloxacin, and Ace inhibitors  Review of Systems   Review of Systems  Musculoskeletal: Positive for neck pain.  All other systems reviewed and are negative.   Physical Exam Updated Vital Signs BP 115/60 (BP Location: Left Arm)   Pulse 67   Temp 98.3 F (36.8 C) (Oral)   Resp 18   Ht 5' 6"  (1.676 m)   Wt 53.5 kg   SpO2 100%   BMI 19.04 kg/m   Physical Exam Vitals and nursing note reviewed.  Constitutional:      General: She is not in acute distress.     Appearance: Normal appearance. She is well-developed.  HENT:     Head: Normocephalic and atraumatic.     Right Ear: Hearing normal.     Left Ear: Hearing normal.     Nose: Nose normal.  Eyes:     Conjunctiva/sclera: Conjunctivae normal.  Pupils: Pupils are equal, round, and reactive to light.  Cardiovascular:     Rate and Rhythm: Regular rhythm.     Heart sounds: S1 normal and S2 normal. No murmur heard.  No friction rub. No gallop.   Pulmonary:     Effort: Pulmonary effort is normal. No respiratory distress.     Breath sounds: Normal breath sounds.  Chest:     Chest wall: No tenderness.  Abdominal:     General: Bowel sounds are normal.     Palpations: Abdomen is soft.     Tenderness: There is no abdominal tenderness. There is no guarding or rebound. Negative signs include Murphy's sign and McBurney's sign.     Hernia: No hernia is present.  Musculoskeletal:        General: Normal range of motion.     Cervical back: Normal range of motion and neck supple.  Skin:    General: Skin is warm and dry.     Findings: No rash.  Neurological:     Mental Status: She is alert and oriented to person, place, and time.     GCS: GCS eye subscore is 4. GCS verbal subscore is 5. GCS motor subscore is 6.     Cranial Nerves: No cranial nerve deficit.     Sensory: No sensory deficit.     Coordination: Coordination normal.  Psychiatric:        Speech: Speech normal.        Behavior: Behavior normal.        Thought Content: Thought content normal.     ED Results / Procedures / Treatments   Labs (all labs ordered are listed, but only abnormal results are displayed) Labs Reviewed  CBC - Abnormal; Notable for the following components:      Result Value   RBC 3.19 (*)    Hemoglobin 10.2 (*)    HCT 31.1 (*)    All other components within normal limits  BASIC METABOLIC PANEL    EKG None  Radiology No results found.  Procedures Procedures (including critical care  time)  Medications Ordered in ED Medications  LORazepam (ATIVAN) injection 1 mg (has no administration in time range)  fentaNYL (SUBLIMAZE) injection 50 mcg (has no administration in time range)  LORazepam (ATIVAN) tablet 0.5 mg (0.5 mg Oral Given 01/04/20 0516)    ED Course  I have reviewed the triage vital signs and the nursing notes.  Pertinent labs & imaging results that were available during my care of the patient were reviewed by me and considered in my medical decision making (see chart for details).    MDM Rules/Calculators/A&P                         Patient presents to the emergency department for evaluation of severe neck pain.  Patient reports that the pain began around 3:30 PM yesterday.  She has a prior chronic cervical radiculopathy with numbness in the right arm.  This is not significantly changed from her baseline.  Examination does reveal normal strength in bilateral upper extremities.  Patient extremely anxious.  She has been out of her Ativan for 2 days.  She was given p.o. Ativan for her anxiety as well as to help with spasms of the neck but did not have improvement.  Nursing staff was unable to establish an IV.  I did place an IV myself and will administer IV analgesia.  Patient will undergo MRI to further evaluate.  Will sign out to oncoming ER physician.  If MRI does not show any concerning findings, patient can be reassured and discharged with analgesia, follow-up with her neurosurgeon in [facility].  Final Clinical Impression(s) / ED Diagnoses Final diagnoses:  Cervical radiculopathy    Rx / DC Orders ED Discharge Orders    None       Orpah Greek, MD 01/04/20 [phone]

## 2020-01-04 NOTE — Discharge Instructions (Addendum)
Follow-up up with neurosurgery to discuss treatment options for your spinal stenosis and mild disc herniation. Return to the emergency room if you develop incontinence, leg or arm weakness, fevers or new concerns. Take Tylenol every 4 hours as needed for pain take steroids to help with inflammation and pain.

## 2020-01-04 NOTE — ED Notes (Signed)
Pt reports anxiety, appearing extremely anxious and crying loudly during phlebotomy attempt, visible tremor noted, increased serevity with extended arms. Pt denies headache, reporting increased neck pain. Pt provided neck support at this time, pt comfortably positioned on stretcher.

## 2020-01-04 NOTE — ED Notes (Signed)
Pt transported to MRI 

## 2020-01-04 NOTE — ED Provider Notes (Signed)
Patient CARE signed out to follow-up MRI to ensure no acute findings that require neurosurgery intervention. Patient had ACDF 5 months prior and has had persistent radicular symptoms.  No focal neuro deficits in the emergency room. MRI results reviewed showing unchanged degeneration and mild and moderate stenosis. Patient stable to follow-up with her neurosurgeon after the weekend.  Golda Acre, MD 01/04/20 [phone]

## 2020-01-31 ENCOUNTER — Other Ambulatory Visit: Payer: Self-pay | Admitting: Family Medicine

## 2020-01-31 DIAGNOSIS — Z981 Arthrodesis status: Secondary | ICD-10-CM | POA: Diagnosis not present

## 2020-01-31 DIAGNOSIS — I2510 Atherosclerotic heart disease of native coronary artery without angina pectoris: Secondary | ICD-10-CM

## 2020-01-31 DIAGNOSIS — K50013 Crohn's disease of small intestine with fistula: Secondary | ICD-10-CM

## 2020-02-05 ENCOUNTER — Ambulatory Visit (INDEPENDENT_AMBULATORY_CARE_PROVIDER_SITE_OTHER): Payer: Medicare Other | Admitting: Family Medicine

## 2020-02-05 ENCOUNTER — Other Ambulatory Visit: Payer: Self-pay

## 2020-02-05 ENCOUNTER — Encounter: Payer: Self-pay | Admitting: Family Medicine

## 2020-02-05 DIAGNOSIS — R202 Paresthesia of skin: Secondary | ICD-10-CM

## 2020-02-05 DIAGNOSIS — I10 Essential (primary) hypertension: Secondary | ICD-10-CM

## 2020-02-05 DIAGNOSIS — F419 Anxiety disorder, unspecified: Secondary | ICD-10-CM

## 2020-02-05 DIAGNOSIS — K50013 Crohn's disease of small intestine with fistula: Secondary | ICD-10-CM | POA: Diagnosis not present

## 2020-02-05 DIAGNOSIS — R200 Anesthesia of skin: Secondary | ICD-10-CM

## 2020-02-05 DIAGNOSIS — M5382 Other specified dorsopathies, cervical region: Secondary | ICD-10-CM

## 2020-02-05 DIAGNOSIS — N184 Chronic kidney disease, stage 4 (severe): Secondary | ICD-10-CM | POA: Diagnosis not present

## 2020-02-05 MED ORDER — PREGABALIN 25 MG PO CAPS: 25.0000 mg | ORAL_CAPSULE | Freq: Two times a day (BID) | ORAL | 3 refills | Status: DC

## 2020-02-05 NOTE — Patient Instructions (Addendum)
I sent in a prescription for a nerve pain medication to help your right arm.   You have a lot of health problems 1. Bad heart failure 2. Worsening kidney failure. 3. 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 4. Crohn's disease. I will do my best to help you feel better and live longer.  Because of all your health problems we are fighting an uphill battle. See me in 3-4 weeks and we will decide what to do next. Keep working with your heart and kidney doctors.  Below is the name and number for your heart doctor.  You are due for an appointment.   Signed, Addison Naegeli. Audie Box, [place]  [address], [place], [place] [address] [phone]

## 2020-02-06 ENCOUNTER — Encounter: Payer: Self-pay | Admitting: Family Medicine

## 2020-02-06 NOTE — Progress Notes (Signed)
    SUBJECTIVE:   CHIEF COMPLAINT / HPI:   Right arm pain in patient with known cervical arthritis.  Plus other chronic medical problems.  1. Patient had cervical fusion one year ago.  Over the past few months has been having worsening right arm pain and tingling.  NS does not want to operate again due to her co-morbid diseases (see below).  She is reluctant to have epidural steroids.  Not currently on any neuropathic pain meds.  She has been on gabapentin in the past but "had a seizure" when she stopped the gabapentin and does not want to try again.  Pain is keeping her up at night.   2. HFrEF.  No leg swelling or orthopnea.  Patient was confused who her heart doctor was and when she was due for her next appointment.  I clarified that she is due now.  On beta blocker and bidil.  Not on ACE or ARB due to renal intolerance. 3. CKD stage 4.  Followed by nephro at [facility].  Recent labs show creat now 3.  No leg swelling or muscle cramping.  No confusion. 4. Crohns disease seems stable.  Appetite OK.  Stool output at her baseline.  5. Anxiety.  Chronic for her. 6. Social.  Lives alone.      OBJECTIVE:   BP (!) 96/54   Pulse 87   Ht 5' 6"  (1.676 m)   Wt 116 lb 12.8 oz (53 kg)   SpO2 93%   BMI 18.85 kg/m   Low BP noted.   Lungs clear Cardiac RRR without m or g abd benign Ext no edema  ASSESSMENT/PLAN:   No problem-specific Assessment & Plan notes found for this encounter.     Zenia Resides, MD [place]

## 2020-02-06 NOTE — Assessment & Plan Note (Signed)
Try on LOW DOSE lyrica.  Adjusted down due to renal disease and previous concern with gabapentin.

## 2020-02-06 NOTE — Assessment & Plan Note (Addendum)
Now hypotensive on meds needed for HFrEF.  I will not make changes but encourage cards FU

## 2020-02-06 NOTE — Assessment & Plan Note (Signed)
Worsening.  Followed by nephro at [facility].  No changes by me.

## 2020-02-06 NOTE — Assessment & Plan Note (Signed)
At her chronic baseline.  No change

## 2020-02-06 NOTE — Assessment & Plan Note (Signed)
Stable on current meds 

## 2020-02-08 ENCOUNTER — Other Ambulatory Visit: Payer: Self-pay | Admitting: Family Medicine

## 2020-02-08 DIAGNOSIS — K50013 Crohn's disease of small intestine with fistula: Secondary | ICD-10-CM

## 2020-02-14 DIAGNOSIS — K5090 Crohn's disease, unspecified, without complications: Secondary | ICD-10-CM | POA: Diagnosis not present

## 2020-02-14 DIAGNOSIS — Z933 Colostomy status: Secondary | ICD-10-CM | POA: Diagnosis not present

## 2020-02-15 DIAGNOSIS — Z933 Colostomy status: Secondary | ICD-10-CM | POA: Diagnosis not present

## 2020-02-15 DIAGNOSIS — K5090 Crohn's disease, unspecified, without complications: Secondary | ICD-10-CM | POA: Diagnosis not present

## 2020-02-19 ENCOUNTER — Other Ambulatory Visit: Payer: Self-pay | Admitting: Family Medicine

## 2020-02-19 DIAGNOSIS — I2510 Atherosclerotic heart disease of native coronary artery without angina pectoris: Secondary | ICD-10-CM

## 2020-02-22 DIAGNOSIS — E872 Acidosis: Secondary | ICD-10-CM | POA: Diagnosis not present

## 2020-02-22 DIAGNOSIS — I130 Hypertensive heart and chronic kidney disease with heart failure and stage 1 through stage 4 chronic kidney disease, or unspecified chronic kidney disease: Secondary | ICD-10-CM | POA: Diagnosis not present

## 2020-02-22 DIAGNOSIS — D631 Anemia in chronic kidney disease: Secondary | ICD-10-CM | POA: Diagnosis not present

## 2020-02-22 DIAGNOSIS — R809 Proteinuria, unspecified: Secondary | ICD-10-CM | POA: Diagnosis not present

## 2020-02-22 DIAGNOSIS — I5022 Chronic systolic (congestive) heart failure: Secondary | ICD-10-CM | POA: Diagnosis not present

## 2020-02-22 DIAGNOSIS — N184 Chronic kidney disease, stage 4 (severe): Secondary | ICD-10-CM | POA: Diagnosis not present

## 2020-03-04 ENCOUNTER — Ambulatory Visit: Payer: Medicare Other | Admitting: Family Medicine

## 2020-03-04 ENCOUNTER — Other Ambulatory Visit: Payer: Self-pay

## 2020-03-04 ENCOUNTER — Ambulatory Visit (HOSPITAL_COMMUNITY)
Admission: EM | Admit: 2020-03-04 | Discharge: 2020-03-04 | Disposition: A | Discharge status: Home / Self Care | Payer: Medicare Other | Attending: Family Medicine | Admitting: Family Medicine

## 2020-03-04 ENCOUNTER — Encounter (HOSPITAL_COMMUNITY): Payer: Self-pay | Admitting: Emergency Medicine

## 2020-03-04 DIAGNOSIS — R220 Localized swelling, mass and lump, head: Secondary | ICD-10-CM

## 2020-03-04 DIAGNOSIS — I10 Essential (primary) hypertension: Secondary | ICD-10-CM | POA: Diagnosis not present

## 2020-03-04 DIAGNOSIS — K146 Glossodynia: Secondary | ICD-10-CM | POA: Diagnosis not present

## 2020-03-04 MED ORDER — NYSTATIN 100000 UNIT/ML MT SUSP
5.0000 mL | Freq: Four times a day (QID) | OROMUCOSAL | 0 refills | Status: DC
Start: 2020-03-04 — End: 2020-03-25

## 2020-03-04 MED ORDER — LIDOCAINE VISCOUS HCL 2 % MT SOLN
20.0000 mL | OROMUCOSAL | 0 refills | Status: DC | PRN
Start: 2020-03-04 — End: 2021-04-25

## 2020-03-04 NOTE — ED Provider Notes (Signed)
[place]    CSN: [id number] Arrival date & time: 03/04/20  1357      History   Chief Complaint Chief Complaint  Patient presents with  . Oral Swelling    HPI [name] [name] is a 69 y.o. female.   Presenting today with daughter who helps care for her. She is here today with the complaint of tongue swelling and pain that she first noticed yesterday. Some pain with swallowing, as well. Patient believes she is having a reaction to the lyrica, which was started 02/05/20. Denies issue over the first 3 or so week on it. No diet changes, wheezing, CP, SOB, rashes, dysphagia. Not trying anything OTC for sxs, and has stopped her lyrica as of this morning. Daughter notes patient does not regularly brush her teeth or clean her dentures. Patient has been unable to find a dentist in the area.      Past Medical History:  Diagnosis Date  . Abnormal chest CT    Coronary atherosclerosis on chest CT 2012  . Anemia, chronic renal failure   . Anxiety   . Asthma 05/2011  . CAD (coronary artery disease)   . Carpal tunnel syndrome   . CHF (congestive heart failure) (HCC)    EF 30-35% 2012->EF 60-65% 2013  . CKD (chronic kidney disease), stage III   . Crohn's disease ([place])   . GERD (gastroesophageal reflux disease)   . Headache(784.0)    "related to high BP" (05/30/2012)  . 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 Hyperlipidemia   . Hypertension   . Osteopenia   . Panic attacks   . Reflux esophagitis   . Seizure ([place])    hx of  . Tobacco abuse   . Vitamin D deficiency     Patient Active Problem List   Diagnosis Date Noted  . 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 Right knee pain 11/02/2018  . Eczema, dyshidrotic 05/04/2014  . Hypomagnesemia 07/13/2013  . Unspecified vitamin D deficiency 05/20/2013  . CAD (coronary artery disease) 05/30/2012  . Tobacco abuse 05/30/2012  . Anemia due to chronic kidney disease 05/30/2012  . Osteopenia 10/05/2011  .  Atypical squamous cells of undetermined significance on cytologic smear of cervix (ASC-US) 09/21/2011  . Left renal artery stenosis ([place]) 09/04/2011  . CKD (chronic kidney disease) stage 4, GFR 15-29 ml/min (HCC) 07/30/2011  . History of seizure 03/13/2011  . Hypertension, benign 09/25/2010  . Numbness and tingling in right hand 11/18/2007  . HYPERCHOLESTEROLEMIA 01/10/2007  . Anxiety 09/02/2006  . NEUROPATHY, PERIPHERAL 09/02/2006  . Reflux esophagitis 09/02/2006  . Regional enteritis [facility]) 09/02/2006    Past Surgical History:  Procedure Laterality Date  . CATARACT EXTRACTION W/ INTRAOCULAR LENS  IMPLANT, BILATERAL  ~ 2000  . CHOLECYSTECTOMY  01/28/2005  . COLOSTOMY  03/1996   diverting  . ECTOPIC PREGNANCY SURGERY  ?1980's   left  . ILEOSTOMY  ?  2002  . NECK SURGERY  2020   "pinched nerve"    OB History    Gravida  2   Para  1   Term  1   Preterm  0   AB  1   Living  1     SAB  0   TAB  0   Ectopic  1   Multiple  0   Live Births               Home Medications    Prior to Admission medications  Medication Sig Start Date End Date Taking? Authorizing Provider  ascorbic acid (VITAMIN C) 500 MG tablet Take by mouth. 01/21/19   [provider]  atorvastatin (LIPITOR) 40 MG tablet Take 1 tablet (40 mg total) by mouth daily. Stop taking simvastatin. 02/19/20   Zenia Resides, MD  calcium citrate (CALCITRATE - DOSED IN MG ELEMENTAL CALCIUM) 950 (200 Ca) MG tablet Take by mouth daily. 01/21/19   [provider]  carvedilol (COREG) 12.5 MG tablet Take 1 tablet (12.5 mg total) by mouth 2 (two) times daily. 09/15/19 12/14/19  O'NealCassie Freer, MD  cholecalciferol (VITAMIN D) 1000 units tablet Take 1,000 Units by mouth daily.    [provider]  clopidogrel (PLAVIX) 75 MG tablet Take 1 tablet (75 mg total) by mouth daily. 10/21/19   Zenia Resides, MD  Ferrous Fumarate 86 (27 Fe) MG CAPS Take by mouth daily.    [provider]  isosorbide-hydrALAZINE (BIDIL) 20-37.5 MG tablet Take 1 tablet by mouth 3 (three) times daily. 09/15/19   O'Neal, Cassie Freer, MD  lidocaine (XYLOCAINE) 2 % solution Use as directed 20 mLs in the mouth or throat as needed for mouth pain. 03/04/20   Volney American, PA-C  LORazepam (ATIVAN) 0.5 MG tablet Take 1 tablet (0.5 mg total) by mouth 2 (two) times daily as needed. for anxiety 10/21/19   Zenia Resides, MD  Mesalamine 800 MG TBEC TAKE ONE TABLET BY MOUTH THREE TIMES DAILY 02/08/20   Zenia Resides, MD  Multiple Vitamin (MULTIVITAMIN WITH MINERALS) TABS tablet Take 1 tablet by mouth 2 (two) times daily.    [provider]  Multiple Vitamin (MULTIVITAMIN) tablet Take 1 tablet by mouth 2 (two) times daily.    [provider]  nystatin (MYCOSTATIN) 100000 UNIT/ML suspension Take 5 mLs (500,000 Units total) by mouth 4 (four) times daily. 03/04/20   Volney American, PA-C  pregabalin (LYRICA) 25 MG capsule Take 1 capsule (25 mg total) by mouth 2 (two) times daily. 02/05/20   Zenia Resides, MD  triamcinolone ointment (KENALOG) 0.1 % Apply 1 application topically 2 (two) times daily. Apply to skin around stoma 12/16/18   Zenia Resides, MD    Family History Family History  Problem Relation Age of Onset  . Heart disease Mother   . Other Mother        Covid  . Stroke Father   . Other Sister        AIDS    Social History Social History   Tobacco Use  . Smoking status: Current Every Day Smoker    Packs/day: 0.12    Years: 30.00    Pack years: 3.60    Types: Cigarettes  . Smokeless tobacco: Never Used  Substance Use Topics  . Alcohol use: No    Comment: 05/30/2012 "used to drink back in the day; last alcohol 23 yr ago"  . Drug use: No     Allergies   Amoxicillin, Aspirin, Morphine and related, Penicillins, Gabapentin, Ciprofloxacin, and Ace inhibitors   Review of Systems Review of Systems  Constitutional: Negative.   HENT:         Tongue swelling and pain  Eyes: Negative.   Respiratory: Negative.   Cardiovascular: Negative.   Gastrointestinal: Negative.   Genitourinary: Negative.   Musculoskeletal: Negative.   Skin: Negative.   Neurological: Negative.   Psychiatric/Behavioral: Negative.      Physical Exam Triage Vital Signs ED Triage Vitals  Enc Vitals Group  BP 03/04/20 1624 (!) 185/78     Pulse Rate 03/04/20 1624 (!) 102     Resp 03/04/20 1624 18     Temp 03/04/20 1624 98.2 F (36.8 C)     Temp src --      SpO2 03/04/20 1624 100 %     Weight --      Height --      Head Circumference --      Peak Flow --      Pain Score 03/04/20 1629 7     Pain Loc --      Pain Edu? --      Excl. in [place]? --    No data found.  Updated Vital Signs BP (!) 185/78 (BP Location: Left Arm)   Pulse (!) 102   Temp 98.2 F (36.8 C)   Resp 18   SpO2 100%   Visual Acuity Right Eye Distance:   Left Eye Distance:   Bilateral Distance:    Right Eye Near:   Left Eye Near:    Bilateral Near:     Physical Exam Vitals and nursing note reviewed.  Constitutional:      Appearance: Normal appearance. She is not ill-appearing.  HENT:     Head: Atraumatic.     Mouth/Throat:     Mouth: Mucous membranes are dry.     Comments: Tongue is pale, coated in white with some areas of irritation at tip. Some mild erythema posterior oropharynx with exudates. No significant swelling appreciable.  Eyes:     Extraocular Movements: Extraocular movements intact.     Conjunctiva/sclera: Conjunctivae normal.  Cardiovascular:     Rate and Rhythm: Normal rate and regular rhythm.     Heart sounds: Normal heart sounds.  Pulmonary:     Effort: Pulmonary effort is normal.     Breath sounds: Normal breath sounds.  Abdominal:     General: Bowel sounds are normal. There is no distension.     Palpations: Abdomen is soft.     Tenderness: There is no abdominal tenderness.  Musculoskeletal:        General: Normal range of motion.      Cervical back: Normal range of motion and neck supple.  Skin:    General: Skin is warm and dry.  Neurological:     Mental Status: She is alert and oriented to person, place, and time.  Psychiatric:        Mood and Affect: Mood normal.        Thought Content: Thought content normal.        Judgment: Judgment normal.     Comments: Mildly anxious      UC Treatments / Results  Labs (all labs ordered are listed, but only abnormal results are displayed) Labs Reviewed - No data to display  EKG   Radiology No results found.  Procedures Procedures (including critical care time)  Medications Ordered in UC Medications - No data to display  Initial Impression / Assessment and Plan / UC Course  I have reviewed the triage vital signs and the nursing notes.  Pertinent labs & imaging results that were available during my care of the patient were reviewed by me and considered in my medical decision making (see chart for details).     Low suspicion for allergic reaction to lyrica given timeline, but given patient's anxiety regarding this recommended to hold until able to follow up with PCP on this. Suspect thrush infection causing her sxs, discussed good oral  hygiene and cleaning of dentures, finding a dentist as soon as able. Tx with nystatin rinse, viscous lidocaine and salt water gargles prn. F/u if worsening or not improving.   Noted to have elevated blood pressure reading today, she states she hasn't yet taken her medicines today. Recommended taking them, rechecking at home and following up with Primary Care if still elevated.   Final Clinical Impressions(s) / UC Diagnoses   Final diagnoses:  Tongue pain  Mild tongue swelling  Essential hypertension   Discharge Instructions   None    ED Prescriptions    Medication Sig Dispense Auth. Provider   lidocaine (XYLOCAINE) 2 % solution Use as directed 20 mLs in the mouth or throat as needed for mouth pain. 100 mL Volney American, PA-C   nystatin (MYCOSTATIN) 100000 UNIT/ML suspension Take 5 mLs (500,000 Units total) by mouth 4 (four) times daily. 60 mL Volney American, [place]     PDMP not reviewed this encounter.   Volney American, [place] 03/05/20 1038

## 2020-03-04 NOTE — ED Triage Notes (Addendum)
Pt states that her tongue is swollen and broken out onset yesterday. Pt states she believes the swelling is due to the lyrica. Daughter is now in triage with patient. Daughter states her mother "cries wolf" a lot for attention and she is growing concerned that she sleeps all day. She states that sometimes she can be reassured by talking and calming her down. Of note patient was behaving normal but once daughter entered room she began labored breathing and rocking back and forth and fidgeting. Pts daughter states she does this when she accompanies her to the doctor or comes to visit.

## 2020-03-13 ENCOUNTER — Other Ambulatory Visit: Payer: Self-pay | Admitting: Cardiovascular Disease

## 2020-03-13 ENCOUNTER — Other Ambulatory Visit: Payer: Self-pay | Admitting: Family Medicine

## 2020-03-13 DIAGNOSIS — L231 Allergic contact dermatitis due to adhesives: Secondary | ICD-10-CM

## 2020-03-13 DIAGNOSIS — I2510 Atherosclerotic heart disease of native coronary artery without angina pectoris: Secondary | ICD-10-CM

## 2020-03-20 ENCOUNTER — Other Ambulatory Visit: Payer: Self-pay | Admitting: Family Medicine

## 2020-03-20 DIAGNOSIS — I5022 Chronic systolic (congestive) heart failure: Secondary | ICD-10-CM | POA: Diagnosis not present

## 2020-03-20 DIAGNOSIS — R200 Anesthesia of skin: Secondary | ICD-10-CM

## 2020-03-20 DIAGNOSIS — I701 Atherosclerosis of renal artery: Secondary | ICD-10-CM | POA: Diagnosis not present

## 2020-03-20 DIAGNOSIS — N179 Acute kidney failure, unspecified: Secondary | ICD-10-CM | POA: Diagnosis not present

## 2020-03-20 DIAGNOSIS — E872 Acidosis: Secondary | ICD-10-CM | POA: Diagnosis not present

## 2020-03-20 DIAGNOSIS — N184 Chronic kidney disease, stage 4 (severe): Secondary | ICD-10-CM | POA: Diagnosis not present

## 2020-03-25 ENCOUNTER — Encounter: Payer: Self-pay | Admitting: Family Medicine

## 2020-03-25 ENCOUNTER — Other Ambulatory Visit: Payer: Self-pay

## 2020-03-25 ENCOUNTER — Ambulatory Visit (INDEPENDENT_AMBULATORY_CARE_PROVIDER_SITE_OTHER): Payer: Medicare Other | Admitting: Family Medicine

## 2020-03-25 VITALS — BP 100/62 | HR 98 | Ht 66.0 in | Wt 112.3 lb

## 2020-03-25 DIAGNOSIS — N184 Chronic kidney disease, stage 4 (severe): Secondary | ICD-10-CM

## 2020-03-25 DIAGNOSIS — Z23 Encounter for immunization: Secondary | ICD-10-CM

## 2020-03-25 DIAGNOSIS — F329 Major depressive disorder, single episode, unspecified: Secondary | ICD-10-CM

## 2020-03-25 DIAGNOSIS — I5020 Unspecified systolic (congestive) heart failure: Secondary | ICD-10-CM

## 2020-03-25 DIAGNOSIS — I2510 Atherosclerotic heart disease of native coronary artery without angina pectoris: Secondary | ICD-10-CM | POA: Diagnosis not present

## 2020-03-25 DIAGNOSIS — F419 Anxiety disorder, unspecified: Secondary | ICD-10-CM | POA: Diagnosis not present

## 2020-03-25 DIAGNOSIS — I959 Hypotension, unspecified: Secondary | ICD-10-CM

## 2020-03-25 DIAGNOSIS — F32A Depression, unspecified: Secondary | ICD-10-CM

## 2020-03-25 MED ORDER — CARVEDILOL 6.25 MG PO TABS: 6.2500 mg | ORAL_TABLET | Freq: Two times a day (BID) | ORAL | 3 refills | Status: DC

## 2020-03-25 MED ORDER — MIRTAZAPINE 15 MG PO TABS: 15.0000 mg | ORAL_TABLET | Freq: Every day | ORAL | 6 refills | Status: DC

## 2020-03-25 NOTE — Patient Instructions (Signed)
I will call with the blood test results. You will get a flu shot today. Your blood pressure is still low, which makes you weak.  I am cutting back the dose of your carvedilol- I sent in a new prescription. Stop taking the old higher dose and start taking the new lower dose. I also sent in a prescription for a pill that will help you sleep and help your appetite.  Take it with the lorazepam. See me in three weeks.  I want to see you frequently until we get you feeling better.

## 2020-03-26 ENCOUNTER — Telehealth: Payer: Self-pay | Admitting: Family Medicine

## 2020-03-26 LAB — RENAL FUNCTION PANEL
Albumin: 4 g/dL (ref 3.8–4.8)
BUN/Creatinine Ratio: 10 — ABNORMAL LOW (ref 12–28)
BUN: 29 mg/dL — ABNORMAL HIGH (ref 8–27)
CO2: 13 mmol/L — ABNORMAL LOW (ref 20–29)
Calcium: 9.3 mg/dL (ref 8.7–10.3)
Chloride: 112 mmol/L — ABNORMAL HIGH (ref 96–106)
Creatinine, Ser: 2.78 mg/dL — ABNORMAL HIGH (ref 0.57–1.00)
GFR calc Af Amer: 20 mL/min/{1.73_m2} — ABNORMAL LOW (ref >59)
GFR calc non Af Amer: 17 mL/min/{1.73_m2} — ABNORMAL LOW (ref >59)
Glucose: 105 mg/dL — ABNORMAL HIGH (ref 65–99)
Phosphorus: 2.6 mg/dL — ABNORMAL LOW (ref 3.0–4.3)
Potassium: 4.1 mmol/L (ref 3.5–5.2)
Sodium: 143 mmol/L (ref 134–144)

## 2020-03-26 LAB — LDL CHOLESTEROL, DIRECT: LDL Direct: 73 mg/dL (ref 0–99)

## 2020-03-26 NOTE — Telephone Encounter (Signed)
Called patient.  Chol good.  Renal function slightly improved.  No change in plan from yesterday.

## 2020-03-27 ENCOUNTER — Encounter: Payer: Self-pay | Admitting: Family Medicine

## 2020-03-27 DIAGNOSIS — I5020 Unspecified systolic (congestive) heart failure: Secondary | ICD-10-CM | POA: Insufficient documentation

## 2020-03-27 NOTE — Assessment & Plan Note (Signed)
Direct LDL at goal.  No chest pain.

## 2020-03-27 NOTE — Assessment & Plan Note (Signed)
Options are limited.  Cannot use ace or ARB due to renal function.  Did not tolerate Bidild due to low BP.  I am reducing carvedilol dose again due to low BP.  Important to keep scheduled cards appointment.

## 2020-03-27 NOTE — Assessment & Plan Note (Signed)
Chronic.  She has done best on lorazepam.  I will add mirtazipine to hopefully help with anxiety, depression, insomnia and poor appetite.

## 2020-03-27 NOTE — Assessment & Plan Note (Signed)
Decrease carvedilol dose - her only current BP med

## 2020-03-27 NOTE — Progress Notes (Signed)
    SUBJECTIVE:   CHIEF COMPLAINT / HPI:   FU weakness.  Patient has follow up of hypotension, CKD 4 and HFrER.  I saw her on 8/2.  Subsequently she was seen by nephro on 8/19 and 9/15.  Because of hypotension, her HFrEF meds were reduced.  She continues to feel week.  She denies any leg swelling.  Appetite is poor.  She has low level weight loss. BP in nephro office was reportedly 80/65.  Better by [place] today but still low.  She does not feel that her weakness has improved.    Has cards appointment on 10/16. No falls.  Usually lives alone.  Daughter has been living with her until she gets her strength back.   Her chronic anxiety seems to be at her baseline.  She endorses not sleeping much.   OBJECTIVE:   BP 100/62   Pulse 98   Ht 5' 6"  (1.676 m)   Wt 112 lb 4.8 oz (50.9 kg)   SpO2 100%   BMI 18.13 kg/m   Lungs clear Cardiac RRR without m or g Abd benign. Ext no edema  ASSESSMENT/PLAN:   CKD (chronic kidney disease) stage 4, GFR 15-29 ml/min (HCC) Creat improved since my last visit.  Cannot see neprho results.  Anxiety and depression Chronic.  She has done best on lorazepam.  I will add mirtazipine to hopefully help with anxiety, depression, insomnia and poor appetite.     HFrEF (heart failure with reduced ejection fraction) (HCC) Options are limited.  Cannot use ace or ARB due to renal function.  Did not tolerate Bidild due to low BP.  I am reducing carvedilol dose again due to low BP.  Important to keep scheduled cards appointment.    CAD (coronary artery disease) Direct LDL at goal.  No chest pain.  Hypotension Decrease carvedilol dose - her only current BP med     Zenia Resides, MD [place]

## 2020-03-27 NOTE — Assessment & Plan Note (Signed)
Creat improved since my last visit.  Cannot see neprho results.

## 2020-04-08 NOTE — Progress Notes (Signed)
Cardiology Office Note:   Date:  04/10/2020  NAME:  [name] [name]    MRN: [id number] DOB:  [birth year]   PCP:  Zenia Resides, MD  Cardiologist:  Evalina Field, MD  Electrophysiologist:  None   Referring MD: Zenia Resides, MD   Chief Complaint  Patient presents with  . Congestive Heart Failure   History of Present Illness:   [name] [name] is a 69 y.o. female with a hx of hypertension, CKD stage IV, hyperlipidemia, systolic heart failure presents for follow-up for systolic heart failure.  She presents for follow-up.  Her nuclear medicine stress test was normal on my review.  I not see any appreciable ischemia.  She reports she is getting short of breath when she exerts herself.  She reports activities such as climbing a flight of stairs does get her profoundly short of breath.  She is still smoking and is smoked for nearly 40 years.  Apparently a pack lasts her 2 weeks.  She describes no increased lower extremity edema.  She has had some low blood pressures at her PCP office but this is resolved.  She remains only on Coreg for blood pressure control.  We did go over the results of her stress test and echocardiogram.  We do need to get her on appropriate heart failure medications.  Given her CKD stage IV this can be problematic.  She is okay to try to get on blood pressure medicines with close follow-up.  She denies any exertional chest pain or pressure.  No volume overload on exam today.  BP 136/83.  Problem List 1. Systolic HF, EF 24-09% -EF recovered in past 35% -> 60%, now back to 30% -MPI 09/28/2019 -> normal on my review  -EF 25-30% 07/2019 2. CKD IV -EGFR 28 3. Hyperlipidemiatotal cholesterol  -183, HDL 55, LDL 106, triglycerides 121 4. Tobacco abuse -every day smoker -40 pack-years  Past Medical History: Past Medical History:  Diagnosis Date  . Abnormal chest CT    Coronary atherosclerosis on chest CT 2012  . Anemia, chronic renal failure   . Anxiety   . Asthma 05/2011   . CAD (coronary artery disease)   . Carpal tunnel syndrome   . CHF (congestive heart failure) (HCC)    EF 30-35% 2012->EF 60-65% 2013  . CKD (chronic kidney disease), stage III ([place])   . Crohn's disease ([place])   . GERD (gastroesophageal reflux disease)   . Headache(784.0)    "related to high BP" (05/30/2012)  . 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 Hyperlipidemia   . Hypertension   . Osteopenia   . Panic attacks   . Reflux esophagitis   . Seizure ([place])    hx of  . Tobacco abuse   . Vitamin D deficiency     Past Surgical History: Past Surgical History:  Procedure Laterality Date  . CATARACT EXTRACTION W/ INTRAOCULAR LENS  IMPLANT, BILATERAL  ~ 2000  . CHOLECYSTECTOMY  01/28/2005  . COLOSTOMY  03/1996   diverting  . ECTOPIC PREGNANCY SURGERY  ?1980's   left  . ILEOSTOMY  ?  2002  . NECK SURGERY  2020   "pinched nerve"    Current Medications: Current Meds  Medication Sig  . ascorbic acid (VITAMIN C) 500 MG tablet Take by mouth.  Marland Kitchen atorvastatin (LIPITOR) 40 MG tablet Take 1 tablet (40 mg total) by mouth daily. Stop taking simvastatin.  . calcium citrate (CALCITRATE - DOSED IN MG ELEMENTAL CALCIUM) 950 (200 Ca) MG tablet  Take by mouth daily.  . carvedilol (COREG) 6.25 MG tablet Take 1 tablet (6.25 mg total) by mouth 2 (two) times daily with a meal.  . cholecalciferol (VITAMIN D) 1000 units tablet Take 1,000 Units by mouth daily.  . clopidogrel (PLAVIX) 75 MG tablet Take 1 tablet (75 mg total) by mouth daily.  . Ferrous Fumarate 86 (27 Fe) MG CAPS Take by mouth daily.  Marland Kitchen lidocaine (XYLOCAINE) 2 % solution Use as directed 20 mLs in the mouth or throat as needed for mouth pain.  Marland Kitchen LORazepam (ATIVAN) 0.5 MG tablet Take 1 tablet (0.5 mg total) by mouth 2 (two) times daily as needed. for anxiety  . Mesalamine 800 MG TBEC TAKE ONE TABLET BY MOUTH THREE TIMES DAILY  . mirtazapine (REMERON) 15 MG tablet Take 1 tablet (15 mg total) by mouth at bedtime.  . Multiple Vitamin  (MULTIVITAMIN) tablet Take 1 tablet by mouth 2 (two) times daily.  . pregabalin (LYRICA) 25 MG capsule Take 1 capsule (25 mg total) by mouth 2 (two) times daily.  . sodium bicarbonate 650 MG tablet Take 1,300 mg by mouth 2 (two) times daily.  Marland Kitchen triamcinolone ointment (KENALOG) 0.1 % Apply 1 application topically 2 (two) times daily. Apply to skin around stoma     Allergies:    Amoxicillin, Aspirin, Morphine and related, Penicillins, Gabapentin, Ciprofloxacin, and Ace inhibitors   Social History: Social History   Socioeconomic History  . Marital status: Single    Spouse name: Not on file  . Number of children: 1  . Years of education: associate  . Highest education level: Not on file  Occupational History  . Occupation: Retired-cook    Employer: [employer]  Tobacco Use  . Smoking status: Current Every Day Smoker    Packs/day: 0.12    Years: 30.00    Pack years: 3.60    Types: Cigarettes  . Smokeless tobacco: Never Used  Substance and Sexual Activity  . Alcohol use: No    Comment: 05/30/2012 "used to drink back in the day; last alcohol 23 yr ago"  . Drug use: No  . Sexual activity: Not Currently  Other Topics Concern  . Not on file  Social History Narrative   Health Care POA:    Emergency Contact: partner, Raj Janus, [phone]   End of Life Plan:    Who lives with you: no one 09/13/19, no pets   Any pets: cat- Wild Thing   Diet: pt has a varied diet of protein, starch, and vegetables    Exercise: Pt does not have any regular exercise routine   Seatbelts:Pt reports wearing seatbelt when in vehicles.    Nancy Fetter Exposure/Protection:    Hobbies: cooking, bingo, basketball         Social Determinants of Radio broadcast assistant Strain:   . Difficulty of Paying Living Expenses: Not on file  Food Insecurity:   . Worried About Charity fundraiser in the Last Year: Not on file  . Ran Out of Food in the Last Year: Not on file  Transportation Needs:   . Lack of  Transportation (Medical): Not on file  . Lack of Transportation (Non-Medical): Not on file  Physical Activity:   . Days of Exercise per Week: Not on file  . Minutes of Exercise per Session: Not on file  Stress:   . Feeling of Stress : Not on file  Social Connections:   . Frequency of Communication with Friends and Family: Not on file  .  Frequency of Social Gatherings with Friends and Family: Not on file  . Attends Religious Services: Not on file  . Active Member of Clubs or Organizations: Not on file  . Attends Archivist Meetings: Not on file  . Marital Status: Not on file     Family History: The patient's family history includes Heart disease in her mother; Other in her mother and sister; Stroke in her father.  ROS:   All other ROS reviewed and negative. Pertinent positives noted in the HPI.     EKGs/Labs/Other Studies Reviewed:   The following studies were personally reviewed by me today:  NM Stress 09/28/2019  EKG not interpretable due to resting ST depressions  The left ventricular ejection fraction is moderately decreased (30-44%).  Nuclear stress EF: 35%.  Defect 1: There is a medium defect of mild severity present in the basal anteroseptal, mid anteroseptal, apical anterior and apex location.  Defect 2: There is a small defect of mild severity present in the basal inferior, mid inferior and apical inferior location.  Findings consistent with ischemia and prior myocardial infarction.  This is an intermediate risk study due to moderately decreased systolic function. Small amount of ischemia   1. Fixed defect consistent with infarct in apex, apical anterior/inferior, and mid to basal anteroseptal walls 2. Reversible defect consistent with ischemia in basal to mid inferior wall consistent with ischemia.  TTE 08/17/2019 1. Severe global reduction in LV systolic function; grade 1 diastolic  dysfunction; mild AI; moderate, eccentric, posteriorly directed MR    suggesting prolapse of anterior MV leaflet; mild LAE.  2. Left ventricular ejection fraction, by estimation, is 25 to 30%. The  left ventricle has severely decreased function. The left ventrical  demonstrates global hypokinesis. Left ventricular diastolic parameters are  consistent with Grade I diastolic  dysfunction (impaired relaxation). Elevated left atrial pressure.  3. Right ventricular systolic function is normal. The right ventricular  size is normal.  4. Left atrial size was mildly dilated.  5. The mitral valve is normal in structure and function. Moderate mitral  valve regurgitation. No evidence of mitral stenosis.  6. The aortic valve is tricuspid. Aortic valve regurgitation is mild.  Mild to moderate aortic valve sclerosis/calcification is present, without  any evidence of aortic stenosis.  7. The inferior vena cava is normal in size with greater than 50%  respiratory variability, suggesting right atrial pressure of 3 mmHg.   Recent Labs: 06/14/2019: ALT 16 09/15/2019: TSH 0.851 01/04/2020: Hemoglobin 10.2; Platelets 175 03/25/2020: BUN 29; Creatinine, Ser 2.78; Potassium 4.1; Sodium 143   Recent Lipid Panel    Component Value Date/Time   CHOL 183 06/14/2019 1553   TRIG 121 06/14/2019 1553   HDL 55 06/14/2019 1553   CHOLHDL 3.3 06/14/2019 1553   CHOLHDL 3.9 03/19/2016 1200   VLDL 36 (H) 03/19/2016 1200   LDLCALC 106 (H) 06/14/2019 1553   LDLDIRECT 73 03/25/2020 1112   LDLDIRECT 129 (H) 07/23/2006 2031    Physical Exam:   VS:  BP 136/83   Pulse 89   Ht 5' 6"  (1.676 m)   Wt 116 lb 9.6 oz (52.9 kg)   SpO2 97%   BMI 18.82 kg/m    Wt Readings from Last 3 Encounters:  04/10/20 116 lb 9.6 oz (52.9 kg)  03/25/20 112 lb 4.8 oz (50.9 kg)  02/05/20 116 lb 12.8 oz (53 kg)    General: Well nourished, well developed, in no acute distress Heart: Atraumatic, normal size  Eyes: PEERLA,  EOMI  Neck: Supple, no JVD Endocrine: No thryomegaly Cardiac: Normal S1, S2;  RRR; no murmurs, rubs, or gallops Lungs: Clear to auscultation bilaterally, no wheezing, rhonchi or rales  Abd: Soft, nontender, no hepatomegaly  Ext: No edema, diminished pulses bilaterally Musculoskeletal: No deformities, BUE and BLE strength normal and equal Skin: Warm and dry, no rashes   Neuro: Alert and oriented to person, place, time, and situation, CNII-XII grossly intact, no focal deficits  Psych: Normal mood and affect   ASSESSMENT:   [name] [name] is a 69 y.o. female who presents for the following: 1. Chronic systolic heart failure ([place])   2. Essential hypertension   3. Mixed hyperlipidemia   4. CKD (chronic kidney disease) stage 4, GFR 15-29 ml/min (HCC)    PLAN:   1. Chronic systolic heart failure (HCC) -EF recovered in past 35% -> 60%, now back to 30% -MPI 09/28/2019 -> normal on my review  -EF 25-30% 07/2019 -EF has dropped.  Significant CKD presents left heart cath.  I did review her nuclear medicine stress test I do not feel strongly enough about pursuing left heart cath.  I think this is a normal test. -I would recommend medical therapy and then pursuing left heart cath if EF does not recover.  She had a nonischemic cardiomyopathy in the past with recovery of EF.  Hopefully we can get her there. -Euvolemic on exam.  No lower extremity edema. -She does get short of breath with exertion. -We will try to optimize her medical regimen.  She will continue Coreg 6.25 mg twice daily.  We will add hydralazine 25 mg 3 times daily this week.  She will then start Imdur 15 mg daily next week.  The idea will be to get her on effective dose of afterload reduction.  Hopefully her blood pressure will tolerate this. -We will see her back in 1 month to determine if she is tolerating medical therapy.  2. Essential hypertension -Coreg, hydralazine, Imdur plan as above.  3. Mixed hyperlipidemia -Continue statin.  She will have labs drawn by PCP soon.  4. CKD (chronic kidney disease) stage  4, GFR 15-29 ml/min (HCC) -Stable.  This precludes ACE/ARB/Arni therapy.  Disposition: Return in about 1 month (around 05/11/2020).  Medication Adjustments/Labs and Tests Ordered: Current medicines are reviewed at length with the patient today.  Concerns regarding medicines are outlined above.  No orders of the defined types were placed in this encounter.  Meds ordered this encounter  Medications  . hydrALAZINE (APRESOLINE) 25 MG tablet    Sig: Take 1 tablet (25 mg total) by mouth 3 (three) times daily.    Dispense:  360 tablet    Refill:  1  . isosorbide mononitrate (IMDUR) 30 MG 24 hr tablet    Sig: Take 0.5 tablets (15 mg total) by mouth daily.    Dispense:  90 tablet    Refill:  1    Patient Instructions  Medication Instructions:  Start Hydralazine 25 three times daily (start this week) Start Imdur 15 mg daily (start next week)   *If you need a refill on your cardiac medications before your next appointment, please call your pharmacy*   Follow-Up: At [facility], you and your health needs are our priority.  As part of our continuing mission to provide you with exceptional heart care, we have created designated Provider Care Teams.  These Care Teams include your primary Cardiologist (physician) and Advanced Practice Providers (APPs -  Physician Assistants and Nurse Practitioners) who  all work together to provide you with the care you need, when you need it.  We recommend signing up for the patient portal called "MyChart".  Sign up information is provided on this After Visit Summary.  MyChart is used to connect with patients for Virtual Visits (Telemedicine).  Patients are able to view lab/test results, encounter notes, upcoming appointments, etc.  Non-urgent messages can be sent to your provider as well.   To learn more about what you can do with MyChart, go to [email or web address].    Your next appointment:   1 month(s)  The format for your next appointment:   In  Person  Provider:   Eleonore Chiquito, MD       Time Spent with Patient: I have spent a total of 35 minutes with patient reviewing hospital notes, telemetry, EKGs, labs and examining the patient as well as establishing an assessment and plan that was discussed with the patient.  > 50% of time was spent in direct patient care.  Signed, Addison Naegeli. Audie Box, [place]  [address] [place], [place] [address] [phone]  04/10/2020 10:26 AM

## 2020-04-10 ENCOUNTER — Ambulatory Visit (INDEPENDENT_AMBULATORY_CARE_PROVIDER_SITE_OTHER): Payer: Medicare Other | Admitting: Cardiovascular Disease

## 2020-04-10 ENCOUNTER — Other Ambulatory Visit: Payer: Self-pay

## 2020-04-10 ENCOUNTER — Encounter: Payer: Self-pay | Admitting: Cardiovascular Disease

## 2020-04-10 VITALS — BP 136/83 | HR 89 | Ht 66.0 in | Wt 116.6 lb

## 2020-04-10 DIAGNOSIS — I5022 Chronic systolic (congestive) heart failure: Secondary | ICD-10-CM

## 2020-04-10 DIAGNOSIS — I10 Essential (primary) hypertension: Secondary | ICD-10-CM

## 2020-04-10 DIAGNOSIS — E782 Mixed hyperlipidemia: Secondary | ICD-10-CM | POA: Diagnosis not present

## 2020-04-10 DIAGNOSIS — N184 Chronic kidney disease, stage 4 (severe): Secondary | ICD-10-CM

## 2020-04-10 MED ORDER — ISOSORBIDE MONONITRATE ER 30 MG PO TB24
15.0000 mg | ORAL_TABLET | Freq: Every day | ORAL | 1 refills | Status: DC
Start: 2020-04-10 — End: 2020-06-19

## 2020-04-10 MED ORDER — HYDRALAZINE HCL 25 MG PO TABS
25.0000 mg | ORAL_TABLET | Freq: Three times a day (TID) | ORAL | 1 refills | Status: DC
Start: 2020-04-10 — End: 2020-07-08

## 2020-04-10 NOTE — Patient Instructions (Addendum)
Medication Instructions:  Start Hydralazine 25 three times daily (start this week) Start Imdur 15 mg daily (start next week)   *If you need a refill on your cardiac medications before your next appointment, please call your pharmacy*   Follow-Up: At [facility], you and your health needs are our priority.  As part of our continuing mission to provide you with exceptional heart care, we have created designated Provider Care Teams.  These Care Teams include your primary Cardiologist (physician) and Advanced Practice Providers (APPs -  Physician Assistants and Nurse Practitioners) who all work together to provide you with the care you need, when you need it.  We recommend signing up for the patient portal called "MyChart".  Sign up information is provided on this After Visit Summary.  MyChart is used to connect with patients for Virtual Visits (Telemedicine).  Patients are able to view lab/test results, encounter notes, upcoming appointments, etc.  Non-urgent messages can be sent to your provider as well.   To learn more about what you can do with MyChart, go to [email or web address].    Your next appointment:   1 month(s)  The format for your next appointment:   In Person  Provider:   Eleonore Chiquito, MD

## 2020-04-12 ENCOUNTER — Other Ambulatory Visit: Payer: Self-pay

## 2020-04-12 ENCOUNTER — Ambulatory Visit (INDEPENDENT_AMBULATORY_CARE_PROVIDER_SITE_OTHER): Payer: Medicare Other | Admitting: Ophthalmology

## 2020-04-12 ENCOUNTER — Encounter (INDEPENDENT_AMBULATORY_CARE_PROVIDER_SITE_OTHER): Payer: Self-pay | Admitting: Ophthalmology

## 2020-04-12 DIAGNOSIS — H43812 Vitreous degeneration, left eye: Secondary | ICD-10-CM | POA: Diagnosis not present

## 2020-04-12 DIAGNOSIS — Z961 Presence of intraocular lens: Secondary | ICD-10-CM | POA: Diagnosis not present

## 2020-04-12 DIAGNOSIS — H3581 Retinal edema: Secondary | ICD-10-CM | POA: Diagnosis not present

## 2020-04-12 DIAGNOSIS — H35033 Hypertensive retinopathy, bilateral: Secondary | ICD-10-CM | POA: Diagnosis not present

## 2020-04-12 DIAGNOSIS — I10 Essential (primary) hypertension: Secondary | ICD-10-CM

## 2020-04-12 DIAGNOSIS — H04123 Dry eye syndrome of bilateral lacrimal glands: Secondary | ICD-10-CM | POA: Diagnosis not present

## 2020-04-12 DIAGNOSIS — H4312 Vitreous hemorrhage, left eye: Secondary | ICD-10-CM | POA: Diagnosis not present

## 2020-04-12 DIAGNOSIS — H20022 Recurrent acute iridocyclitis, left eye: Secondary | ICD-10-CM | POA: Diagnosis not present

## 2020-04-12 NOTE — Progress Notes (Addendum)
[facility] Note  04/12/2020     CHIEF COMPLAINT Patient presents for Retina Evaluation   HISTORY OF PRESENT ILLNESS: [name] [name] is a 69 y.o. female who presents to the clinic today for:   HPI    Retina Evaluation    In left eye.  This started 5 days ago.  Duration of 5 days.  Associated Symptoms Floaters, Photophobia and Pain.  Negative for Flashes, Blind Spot, Scalp Tenderness, Fever, Glare, Jaw Claudication, Weight Loss, Distortion, Redness, Trauma, Shoulder/Hip pain and Fatigue.  Context:  distance vision, mid-range vision, near vision, night driving and driving.  Treatments tried include no treatments.  I, the attending physician,  performed the HPI with the patient and updated documentation appropriately.          Comments    Patient states has had smoky vision OS. Clouds float in and out of vision OS. Minimal flashing OS, if driving at night-headlights from oncoming cars. No curtain/veil over vision. OS is photophobic, bright lights cause discomfort/pain OS.       Last edited by Bernarda Caffey, MD on 04/12/2020 11:13 AM. (History)    pt states for the past 5 days her vision has seemed like she is looking through "thick smoke", she states she feels like it is trying to get worse because now she sees "red spots", she states she is on Plavix bc a virus attacked a muscle in her heart, she just started taking hydralazine, but states it is bc her BP is too low, pt has had cataract sx with Dr. Elliot Dally  Referring physician: Shirleen Schirmer, PA-C  [facility] [address] [place],  [place] [address]  HISTORICAL INFORMATION:   Selected notes from the MEDICAL RECORD NUMBER Referred by Shirleen Schirmer for concern of PVD / vitreous hemorrhage LEE:  Ocular Hx- PMH-    CURRENT MEDICATIONS: No current outpatient medications on file. (Ophthalmic Drugs)   No current facility-administered medications for this visit. (Ophthalmic  Drugs)   Current Outpatient Medications (Other)  Medication Sig  . ascorbic acid (VITAMIN C) 500 MG tablet Take by mouth.  Marland Kitchen atorvastatin (LIPITOR) 40 MG tablet Take 1 tablet (40 mg total) by mouth daily. Stop taking simvastatin.  . calcium citrate (CALCITRATE - DOSED IN MG ELEMENTAL CALCIUM) 950 (200 Ca) MG tablet Take by mouth daily.  . carvedilol (COREG) 6.25 MG tablet Take 1 tablet (6.25 mg total) by mouth 2 (two) times daily with a meal.  . cholecalciferol (VITAMIN D) 1000 units tablet Take 1,000 Units by mouth daily.  . clopidogrel (PLAVIX) 75 MG tablet Take 1 tablet (75 mg total) by mouth daily.  . Ferrous Fumarate 86 (27 Fe) MG CAPS Take by mouth daily.  . hydrALAZINE (APRESOLINE) 25 MG tablet Take 1 tablet (25 mg total) by mouth 3 (three) times daily.  . isosorbide mononitrate (IMDUR) 30 MG 24 hr tablet Take 0.5 tablets (15 mg total) by mouth daily.  Marland Kitchen lidocaine (XYLOCAINE) 2 % solution Use as directed 20 mLs in the mouth or throat as needed for mouth pain.  Marland Kitchen LORazepam (ATIVAN) 0.5 MG tablet Take 1 tablet (0.5 mg total) by mouth 2 (two) times daily as needed. for anxiety  . Mesalamine 800 MG TBEC TAKE ONE TABLET BY MOUTH THREE TIMES DAILY  . mirtazapine (REMERON) 15 MG tablet Take 1 tablet (15 mg total) by mouth at bedtime.  . Multiple Vitamin (MULTIVITAMIN) tablet Take 1 tablet by mouth 2 (two)  times daily.  . pregabalin (LYRICA) 25 MG capsule Take 1 capsule (25 mg total) by mouth 2 (two) times daily.  . sodium bicarbonate 650 MG tablet Take 1,300 mg by mouth 2 (two) times daily.  Marland Kitchen triamcinolone ointment (KENALOG) 0.1 % Apply 1 application topically 2 (two) times daily. Apply to skin around stoma   No current facility-administered medications for this visit. (Other)      REVIEW OF SYSTEMS: ROS    Positive for: Cardiovascular, Eyes   Negative for: Constitutional, Gastrointestinal, Neurological, Skin, Genitourinary, Musculoskeletal, HENT, Endocrine, Respiratory, Psychiatric,  Allergic/Imm, Heme/Lymph   Last edited by Roselee Nova D, COT on 04/12/2020 11:00 AM. (History)       ALLERGIES Allergies  Allergen Reactions  . Amoxicillin Anaphylaxis, Rash and Other (See Comments)    Throat Swelling  . Aspirin Anaphylaxis, Itching and Rash  . Morphine And Related Anaphylaxis  . Penicillins Anaphylaxis and Rash  . Gabapentin Other (See Comments)    Patient had one time seizure shortly after stopping gabapentin  . Ciprofloxacin Swelling    Possible reaction to cipro or clindamycin 04/29/14  . Ace Inhibitors Other (See Comments) and Cough    ACE possibly associated with cough and switched to ARB.  Would be OK to re-challenge if needed.    PAST MEDICAL HISTORY Past Medical History:  Diagnosis Date  . Abnormal chest CT    Coronary atherosclerosis on chest CT 2012  . Anemia, chronic renal failure   . Anxiety   . Asthma 05/2011  . CAD (coronary artery disease)   . Carpal tunnel syndrome   . CHF (congestive heart failure) (HCC)    EF 30-35% 2012->EF 60-65% 2013  . CKD (chronic kidney disease), stage III ([place])   . Crohn's disease ([place])   . GERD (gastroesophageal reflux disease)   . Headache(784.0)    "related to high BP" (05/30/2012)  . 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 Hyperlipidemia   . Hypertension   . Osteopenia   . Panic attacks   . Reflux esophagitis   . Seizure ([place])    hx of  . Tobacco abuse   . Vitamin D deficiency    Past Surgical History:  Procedure Laterality Date  . CATARACT EXTRACTION     Dr. Clent Jacks  . CATARACT EXTRACTION W/ INTRAOCULAR LENS  IMPLANT, BILATERAL  ~ 2000  . CHOLECYSTECTOMY  01/28/2005  . COLOSTOMY  03/1996   diverting  . ECTOPIC PREGNANCY SURGERY  ?1980's   left  . ILEOSTOMY  ?  2002  . NECK SURGERY  2020   "pinched nerve"    FAMILY HISTORY Family History  Problem Relation Age of Onset  . Heart disease Mother   . Other Mother        Covid  . Glaucoma Mother   . Stroke Father   . Other Sister         AIDS    SOCIAL HISTORY Social History   Tobacco Use  . Smoking status: Current Every Day Smoker    Packs/day: 0.12    Years: 30.00    Pack years: 3.60    Types: Cigarettes  . Smokeless tobacco: Never Used  Substance Use Topics  . Alcohol use: No    Comment: 05/30/2012 "used to drink back in the day; last alcohol 23 yr ago"  . Drug use: No         OPHTHALMIC EXAM:  Base Eye Exam    Visual Acuity (Snellen - Linear)  Right Left   Dist [place] 20/50 20/50   Dist ph [place] 20/30 -2 20/40 -1       Tonometry (Tonopen, 11:07 AM)      Right Left   Pressure 11 12       Pupils      Dark Light Shape React APD   Right 7 7 Round None None   Left 7 7 Round None None  Patient dilated OU       Visual Fields (Counting fingers)      Left Right    Full Full       Extraocular Movement      Right Left    Full, Ortho Full, Ortho       Neuro/Psych    Oriented x3: Yes   Mood/Affect: Normal       Dilation    Both eyes: 2.5% Phenylephrine, 1.0% Mydriacyl @ 11:07 AM        Slit Lamp and Fundus Exam    Slit Lamp Exam      Right Left   Lids/Lashes Dermatochalasis - upper lid Dermatochalasis - upper lid   Conjunctiva/Sclera White and quiet Melanosis, nasal pinguecula   Cornea arcus, Debris in tear film, 1+ Punctate epithelial erosions, EBMD arcus, Debris in tear film, 1+ Punctate epithelial erosions, EBMD   Anterior Chamber Deep and quiet Deep and quiet   Iris Round and dilated Round and dilated   Lens 3 piece PC IOL in good position, +phimosis and fibrosis, open PC PC IOL in good position   Vitreous Vitreous syneresis Vitreous syneresis, Posterior vitreous detachment, blood stained vitreous condensations, pre-retinal heme settled inferiorly       Fundus Exam      Right Left   Disc mild Pallor, Sharp rim Pink and Sharp   C/D Ratio 0.3 0.4   Macula Flat, Blunted foveal reflex, RPE mottling and clumping, No heme or edema Flat, Blunted foveal reflex, RPE mottling, No heme  or edema   Vessels attenuated attenuated, Tortuous   Periphery Attached, No heme, No RT/RD Attached, No RT/RD; mild preretinal heme settled inferiorly        Refraction    Manifest Refraction (Auto)      Sphere Cylinder Axis Dist VA   Right -0.25 +0.75 131 20/50   Left -1.50 +1.00 144 20/50          IMAGING AND PROCEDURES  Imaging and Procedures for 04/12/2020  OCT, Retina - OU - Both Eyes       Right Eye Quality was good. Central Foveal Thickness: 260. Progression has no prior data. Findings include normal foveal contour, no IRF, no SRF (Partial PVD).   Left Eye Quality was good. Central Foveal Thickness: 303. Progression has no prior data. Findings include normal foveal contour, no IRF, no SRF (Vitreous opacities).   Notes *Images captured and stored on drive  Diagnosis / Impression:  NFP; no IRF/SRF OU OD: partial PVD OS: +Vit opacities  Clinical management:  See below  Abbreviations: NFP - Normal foveal profile. CME - cystoid macular edema. PED - pigment epithelial detachment. IRF - intraretinal fluid. SRF - subretinal fluid. EZ - ellipsoid zone. ERM - epiretinal membrane. ORA - outer retinal atrophy. ORT - outer retinal tubulation. SRHM - subretinal hyper-reflective material. IRHM - intraretinal hyper-reflective material                 ASSESSMENT/PLAN:    ICD-10-CM   1. Posterior vitreous detachment of left eye  H43.812   2.  Retinal edema  H35.81 OCT, Retina - OU - Both Eyes  3. Essential hypertension  I10   4. Hypertensive retinopathy of both eyes  H35.033   5. Pseudophakia, both eyes  Z96.1     1,2. Hemorrhagic PVD OS  - Onset ~5 days -- presented to [facility] today, 10.8.2021  - Discussed findings and prognosis  - No RT or RD on 360 scleral depressed exam OS  - Reviewed s/s of RT/RD  - Strict return precautions for any such RT/RD signs/symptoms  - pt is on plavix  - VH precautions reviewed -- minimize activities, keep head elevated, avoid  ASA/NSAIDs/blood thinners as able  - F/U 2 weeks, sooner prn -- DFE/OCT  3,4. Hypertensive retinopathy OU - discussed importance of tight BP control - monitor  5. Pseudophakia OU  - s/p CE/IOL OU (Dr. Elliot Dally)  - IOLs in good position, doing well  - monitor   Ophthalmic Meds Ordered this visit:  No orders of the defined types were placed in this encounter.      Return in about 2 weeks (around 04/26/2020) for f/u PVD OS, DFE, OCT.  There are no Patient Instructions on file for this visit.   Explained the diagnoses, plan, and follow up with the patient and they expressed understanding.  Patient expressed understanding of the importance of proper follow up care.  This document serves as a record of services personally performed by Gardiner Sleeper, MD, PhD. It was created on their behalf by San Jetty. Owens Shark, OA an ophthalmic technician. The creation of this record is the provider's dictation and/or activities during the visit.    Electronically signed by: San Jetty. Owens Shark, [place] 10.08.2021 1:08 PM   Gardiner Sleeper, [name], Ph.D. Diseases & Surgery of the Retina and Vitreous [facility]  I have reviewed the above documentation for accuracy and completeness, and I agree with the above. Gardiner Sleeper, [name], Ph.D. 04/12/20 1:08 PM  Abbreviations: M myopia (nearsighted); A astigmatism; H hyperopia (farsighted); P presbyopia; Mrx spectacle prescription;  CTL contact lenses; OD right eye; OS left eye; OU both eyes  XT exotropia; ET esotropia; PEK punctate epithelial keratitis; PEE punctate epithelial erosions; DES dry eye syndrome; MGD meibomian gland dysfunction; ATs artificial tears; PFAT's preservative free artificial tears; [place] nuclear sclerotic cataract; PSC posterior subcapsular cataract; ERM epi-retinal membrane; PVD posterior vitreous detachment; RD retinal detachment; DM diabetes mellitus; DR diabetic retinopathy; NPDR non-proliferative diabetic retinopathy;  PDR proliferative diabetic retinopathy; CSME clinically significant macular edema; DME diabetic macular edema; dbh dot blot hemorrhages; CWS cotton wool spot; POAG primary open angle glaucoma; C/D cup-to-disc ratio; HVF humphrey visual field; GVF goldmann visual field; OCT optical coherence tomography; IOP intraocular pressure; BRVO Branch retinal vein occlusion; CRVO central retinal vein occlusion; CRAO central retinal artery occlusion; BRAO branch retinal artery occlusion; RT retinal tear; SB scleral buckle; PPV pars plana vitrectomy; VH Vitreous hemorrhage; PRP panretinal laser photocoagulation; IVK intravitreal kenalog; VMT vitreomacular traction; MH Macular hole;  NVD neovascularization of the disc; NVE neovascularization elsewhere; AREDS age related eye disease study; ARMD age related macular degeneration; POAG primary open angle glaucoma; EBMD epithelial/anterior basement membrane dystrophy; ACIOL anterior chamber intraocular lens; IOL intraocular lens; PCIOL posterior chamber intraocular lens; Phaco/IOL phacoemulsification with intraocular lens placement; [place] photorefractive keratectomy; LASIK laser assisted in situ keratomileusis; HTN hypertension; DM diabetes mellitus; COPD chronic obstructive pulmonary disease

## 2020-04-15 ENCOUNTER — Ambulatory Visit (INDEPENDENT_AMBULATORY_CARE_PROVIDER_SITE_OTHER): Payer: Medicare Other | Admitting: Family Medicine

## 2020-04-15 ENCOUNTER — Other Ambulatory Visit: Payer: Self-pay

## 2020-04-15 ENCOUNTER — Encounter: Payer: Self-pay | Admitting: Family Medicine

## 2020-04-15 VITALS — BP 130/76 | Ht 66.0 in | Wt 117.2 lb

## 2020-04-15 DIAGNOSIS — N184 Chronic kidney disease, stage 4 (severe): Secondary | ICD-10-CM | POA: Diagnosis not present

## 2020-04-15 DIAGNOSIS — I5020 Unspecified systolic (congestive) heart failure: Secondary | ICD-10-CM

## 2020-04-15 DIAGNOSIS — Z23 Encounter for immunization: Secondary | ICD-10-CM | POA: Diagnosis not present

## 2020-04-15 DIAGNOSIS — F419 Anxiety disorder, unspecified: Secondary | ICD-10-CM | POA: Diagnosis not present

## 2020-04-15 DIAGNOSIS — F32A Depression, unspecified: Secondary | ICD-10-CM | POA: Diagnosis not present

## 2020-04-15 NOTE — Assessment & Plan Note (Signed)
Stable on recent check.  No need to check today.

## 2020-04-15 NOTE — Patient Instructions (Signed)
I am glad you have a good doctor team.  Please stay with your heart and kidney and eye doctors: We need their help. I think you are on a good medication program right now.  Stick to it. You will get your COVID booster today See me in early December to make sure are going well. You and I both have cold hands.

## 2020-04-15 NOTE — Progress Notes (Signed)
    SUBJECTIVE:   CHIEF COMPLAINT / HPI:   Follow up CHF and CKD. Patient has been seen by both cards and nephrology since my last visit.  Patient feels somewhat better.  More steady.  Less DOE.  No CP, no swelling.  Started on hydralazine and imdur by cards.  Tolerating well.  No side effects.  No light headedness.   Chronic anxiety is under reasonable control.  She does not feel overwhelmed at present.  Living with daughter who is helping.  She looks forward to regaining her independence and returning to living alone.       OBJECTIVE:   BP 130/76   Ht 5' 6"  (1.676 m)   Wt 117 lb 3.2 oz (53.2 kg)   BMI 18.92 kg/m   Lungs clear Cardiac RRR without m or g Abd benign Ext no edema  ASSESSMENT/PLAN:   HFrEF (heart failure with reduced ejection fraction) (HCC) Euvolemic on exam.  No medication induced hypotension.  CKD (chronic kidney disease) stage 4, GFR 15-29 ml/min (HCC) Stable on recent check.  No need to check today.  Anxiety and depression Stable on current meds.  She appears calm and happy today.     Zenia Resides, MD [place]

## 2020-04-15 NOTE — Assessment & Plan Note (Signed)
Stable on current meds.  She appears calm and happy today.

## 2020-04-15 NOTE — Assessment & Plan Note (Signed)
Euvolemic on exam.  No medication induced hypotension.

## 2020-04-24 NOTE — Progress Notes (Signed)
[facility] Note  04/29/2020     CHIEF COMPLAINT Patient presents for Retina Follow Up   HISTORY OF PRESENT ILLNESS: [name] [name] is a 69 y.o. female who presents to the clinic today for:   HPI    Retina Follow Up    Patient presents with  PVD.  In left eye.  Severity is moderate.  Duration of 2 weeks.  Since onset it is stable.  I, the attending physician,  performed the HPI with the patient and updated documentation appropriately.          Comments    Patient states still has blur, "smokiness"in vision OS. No change since last visit.        Last edited by Bernarda Caffey, MD on 04/29/2020 11:55 AM. (History)    pt states her left eye is still blurry, but she denies fol or missing spots of her vision, she has been sleeping with her head elavated  Referring physician: Shirleen Schirmer, PA-C     HISTORICAL INFORMATION:   Selected notes from the [place] Referred by Shirleen Schirmer for concern of PVD / vitreous hemorrhage LEE:  Ocular Hx- PMH-    CURRENT MEDICATIONS: No current outpatient medications on file. (Ophthalmic Drugs)   No current facility-administered medications for this visit. (Ophthalmic Drugs)   Current Outpatient Medications (Other)  Medication Sig  . ascorbic acid (VITAMIN C) 500 MG tablet Take by mouth.  Marland Kitchen atorvastatin (LIPITOR) 40 MG tablet Take 1 tablet (40 mg total) by mouth daily. Stop taking simvastatin.  . calcium citrate (CALCITRATE - DOSED IN MG ELEMENTAL CALCIUM) 950 (200 Ca) MG tablet Take by mouth daily.  . carvedilol (COREG) 6.25 MG tablet Take 1 tablet (6.25 mg total) by mouth 2 (two) times daily with a meal.  . cholecalciferol (VITAMIN D) 1000 units tablet Take 1,000 Units by mouth daily.  . clopidogrel (PLAVIX) 75 MG tablet Take 1 tablet (75 mg total) by mouth daily.  . Ferrous Fumarate 86 (27 Fe) MG CAPS Take by mouth daily.  . hydrALAZINE (APRESOLINE) 25 MG tablet Take 1 tablet (25 mg total)  by mouth 3 (three) times daily.  . isosorbide mononitrate (IMDUR) 30 MG 24 hr tablet Take 0.5 tablets (15 mg total) by mouth daily.  Marland Kitchen lidocaine (XYLOCAINE) 2 % solution Use as directed 20 mLs in the mouth or throat as needed for mouth pain.  Marland Kitchen LORazepam (ATIVAN) 0.5 MG tablet Take 1 tablet (0.5 mg total) by mouth 2 (two) times daily as needed. for anxiety  . Mesalamine 800 MG TBEC TAKE ONE TABLET BY MOUTH THREE TIMES DAILY  . mirtazapine (REMERON) 15 MG tablet Take 1 tablet (15 mg total) by mouth at bedtime.  . Multiple Vitamin (MULTIVITAMIN) tablet Take 1 tablet by mouth 2 (two) times daily.  . pregabalin (LYRICA) 25 MG capsule Take 1 capsule (25 mg total) by mouth 2 (two) times daily.  . sodium bicarbonate 650 MG tablet Take 1,300 mg by mouth 2 (two) times daily.  Marland Kitchen triamcinolone ointment (KENALOG) 0.1 % Apply 1 application topically 2 (two) times daily. Apply to skin around stoma   No current facility-administered medications for this visit. (Other)      REVIEW OF SYSTEMS: ROS    Positive for: Cardiovascular, Eyes   Negative for: Constitutional, Gastrointestinal, Neurological, Skin, Genitourinary, Musculoskeletal, HENT, Endocrine, Respiratory, Psychiatric, Allergic/Imm, Heme/Lymph   Last edited by Roselee Nova D, COT on 04/29/2020  9:21 AM. (History)  ALLERGIES Allergies  Allergen Reactions  . Amoxicillin Anaphylaxis, Rash and Other (See Comments)    Throat Swelling  . Aspirin Anaphylaxis, Itching and Rash  . Morphine And Related Anaphylaxis  . Penicillins Anaphylaxis and Rash  . Gabapentin Other (See Comments)    Patient had one time seizure shortly after stopping gabapentin  . Ciprofloxacin Swelling    Possible reaction to cipro or clindamycin 04/29/14  . Ace Inhibitors Other (See Comments) and Cough    ACE possibly associated with cough and switched to ARB.  Would be OK to re-challenge if needed.    PAST MEDICAL HISTORY Past Medical History:  Diagnosis Date  .  Abnormal chest CT    Coronary atherosclerosis on chest CT 2012  . Anemia, chronic renal failure   . Anxiety   . Asthma 05/2011  . CAD (coronary artery disease)   . Carpal tunnel syndrome   . CHF (congestive heart failure) (HCC)    EF 30-35% 2012->EF 60-65% 2013  . CKD (chronic kidney disease), stage III ([place])   . Crohn's disease ([place])   . GERD (gastroesophageal reflux disease)   . Headache(784.0)    "related to high BP" (05/30/2012)  . 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 Hyperlipidemia   . Hypertension   . Osteopenia   . Panic attacks   . Reflux esophagitis   . Seizure ([place])    hx of  . Tobacco abuse   . Vitamin D deficiency    Past Surgical History:  Procedure Laterality Date  . CATARACT EXTRACTION     Dr. Clent Jacks  . CATARACT EXTRACTION W/ INTRAOCULAR LENS  IMPLANT, BILATERAL  ~ 2000  . CHOLECYSTECTOMY  01/28/2005  . COLOSTOMY  03/1996   diverting  . ECTOPIC PREGNANCY SURGERY  ?1980's   left  . ILEOSTOMY  ?  2002  . NECK SURGERY  2020   "pinched nerve"    FAMILY HISTORY Family History  Problem Relation Age of Onset  . Heart disease Mother   . Other Mother        Covid  . Glaucoma Mother   . Stroke Father   . Other Sister        AIDS    SOCIAL HISTORY Social History   Tobacco Use  . Smoking status: Current Every Day Smoker    Packs/day: 0.12    Years: 30.00    Pack years: 3.60    Types: Cigarettes  . Smokeless tobacco: Never Used  Substance Use Topics  . Alcohol use: No    Comment: 05/30/2012 "used to drink back in the day; last alcohol 23 yr ago"  . Drug use: No         OPHTHALMIC EXAM:  Base Eye Exam    Visual Acuity (Snellen - Linear)      Right Left   Dist [place] 20/30 -2 20/25   Dist ph [place] 20/30 20/20       Tonometry (Tonopen, 9:29 AM)      Right Left   Pressure 12 12       Pupils      Dark Light Shape React APD   Right 3 2 Round Slow None   Left 3 2 Round Slow None       Visual Fields (Counting fingers)      Left Right     Full Full       Extraocular Movement      Right Left    Full, Ortho Full, Ortho  Neuro/Psych    Oriented x3: Yes   Mood/Affect: Normal       Dilation    Both eyes: 1.0% Mydriacyl, 2.5% Phenylephrine @ 9:29 AM        Slit Lamp and Fundus Exam    Slit Lamp Exam      Right Left   Lids/Lashes Dermatochalasis - upper lid Dermatochalasis - upper lid   Conjunctiva/Sclera White and quiet Melanosis, nasal pinguecula   Cornea arcus, Debris in tear film, 1+ Punctate epithelial erosions, EBMD arcus, Debris in tear film, 1+ Punctate epithelial erosions, EBMD   Anterior Chamber Deep and quiet Deep and quiet   Iris Round and dilated Round and dilated   Lens 3 piece PC IOL in good position, +phimosis and fibrosis, open PC PC IOL in good position   Vitreous Vitreous syneresis Vitreous syneresis, Posterior vitreous detachment, blood stained vitreous condensations - improving, pre-retinal heme settled inferiorly       Fundus Exam      Right Left   Disc mild Pallor, Sharp rim Mild Pallor, Sharp rim   C/D Ratio 0.3 0.5   Macula Flat, Blunted foveal reflex, RPE mottling and clumping, No heme or edema Flat, Blunted foveal reflex, RPE mottling, No heme or edema   Vessels attenuated attenuated, Tortuous   Periphery Attached, No heme, No RT/RD Attached, No RT/RD; mild preretinal heme settled inferiorly          IMAGING AND PROCEDURES  Imaging and Procedures for 04/29/2020  OCT, Retina - OU - Both Eyes       Right Eye Quality was good. Central Foveal Thickness: 258. Progression has been stable. Findings include normal foveal contour, no IRF, no SRF (Partial PVD).   Left Eye Quality was good. Central Foveal Thickness: 287. Progression has been stable. Findings include normal foveal contour, no IRF, no SRF (Vitreous opacities -- slightly improved).   Notes *Images captured and stored on drive  Diagnosis / Impression:  NFP; no IRF/SRF OU OD: partial PVD OS: +Vit opacities --  slightly improved  Clinical management:  See below  Abbreviations: NFP - Normal foveal profile. CME - cystoid macular edema. PED - pigment epithelial detachment. IRF - intraretinal fluid. SRF - subretinal fluid. EZ - ellipsoid zone. ERM - epiretinal membrane. ORA - outer retinal atrophy. ORT - outer retinal tubulation. SRHM - subretinal hyper-reflective material. IRHM - intraretinal hyper-reflective material        Fluorescein Angiography Optos (Transit OS)       Right Eye   Progression has no prior data. Early phase findings include (No earlies obtained, first image at 1:20). Mid/Late phase findings include vascular perfusion defect, leakage (Focal area of vascular non-perfusion at 1000 with mild NV and leakage).   Left Eye   Progression has no prior data. Early phase findings include delayed filling, normal observations. Mid/Late phase findings include microaneurysm, leakage, vascular perfusion defect (Mild focal area of vascular non-perfusion temporal periphery with late leaking MA's).   Notes **Images stored on drive**  Impression: OD: non-diabetic proliferative retinopathy--focal patch of peripheral nonperfusion w/ leaking NV, superotemporal periphery OS: Mild focal area of vascular non-perfusion temporal periphery with late leaking MA's                  ASSESSMENT/PLAN:    ICD-10-CM   1. Posterior vitreous detachment of left eye  H43.812   2. Retinal edema  H35.81 OCT, Retina - OU - Both Eyes  3. Proliferative retinopathy of right eye  H35.21   4.  Retinal ischemia  H35.82   5. Essential hypertension  I10   6. Hypertensive retinopathy of both eyes  H35.033 Fluorescein Angiography Optos (Transit OS)  7. Pseudophakia, both eyes  Z96.1     1,2. Hemorrhagic PVD OS  - pt [facility]  - Discussed findings and prognosis  - No RT or RD on 360 scleral depressed exam OS  - Reviewed s/s of RT/RD  - Strict return precautions for any such RT/RD  signs/symptoms  - pt is on plavix  - VH improving  - VH precautions reviewed -- minimize activities, keep head elevated, avoid ASA/NSAIDs/blood thinners as able  - F/U 2-3 weeks, sooner prn -- DFE/OCT  3,4. Proliferative retinopathy, non diabetic OD; retinal ischemia OU  - FA 10.25.21 shows   OD - focal area of peripheral nonperfusion with leaking NV, sup temp periphery   OS - focal area of peripheral nonperfusion with leaking MA, inf temp periphery  - may benefit from focal, segmental PRP OD  5,6. Hypertensive retinopathy OU - discussed importance of tight BP control - monitor  7. Pseudophakia OU  - s/p CE/IOL OU (Dr. Elliot Dally)  - IOLs in good position, doing well  - monitor   Ophthalmic Meds Ordered this visit:  No orders of the defined types were placed in this encounter.      Return for f/u 2-3 weeks, PVD OS, DFE, OCT.  There are no Patient Instructions on file for this visit.  This document serves as a record of services personally performed by Gardiner Sleeper, MD, PhD. It was created on their behalf by Leeann Must, [place], an ophthalmic technician. The creation of this record is the provider's dictation and/or activities during the visit.    Electronically signed by: Leeann Must, [place] 10.20.2021 12:34 PM   This document serves as a record of services personally performed by Gardiner Sleeper, MD, PhD. It was created on their behalf by San Jetty. Owens Shark, OA an ophthalmic technician. The creation of this record is the provider's dictation and/or activities during the visit.    Electronically signed by: San Jetty. Owens Shark, OA  12:34 PM  Gardiner Sleeper, [name], Ph.D. Diseases & Surgery of the Retina and Vitreous [facility] 04/29/2020   I have reviewed the above documentation for accuracy and completeness, and I agree with the above. Gardiner Sleeper, [name], Ph.D. 04/29/20 12:34 PM   Abbreviations: M myopia (nearsighted); A astigmatism; H hyperopia  (farsighted); P presbyopia; Mrx spectacle prescription;  CTL contact lenses; OD right eye; OS left eye; OU both eyes  XT exotropia; ET esotropia; PEK punctate epithelial keratitis; PEE punctate epithelial erosions; DES dry eye syndrome; MGD meibomian gland dysfunction; ATs artificial tears; PFAT's preservative free artificial tears; [place] nuclear sclerotic cataract; PSC posterior subcapsular cataract; ERM epi-retinal membrane; PVD posterior vitreous detachment; RD retinal detachment; DM diabetes mellitus; DR diabetic retinopathy; NPDR non-proliferative diabetic retinopathy; PDR proliferative diabetic retinopathy; CSME clinically significant macular edema; DME diabetic macular edema; dbh dot blot hemorrhages; CWS cotton wool spot; POAG primary open angle glaucoma; C/D cup-to-disc ratio; HVF humphrey visual field; GVF goldmann visual field; OCT optical coherence tomography; IOP intraocular pressure; BRVO Branch retinal vein occlusion; CRVO central retinal vein occlusion; CRAO central retinal artery occlusion; BRAO branch retinal artery occlusion; RT retinal tear; SB scleral buckle; PPV pars plana vitrectomy; VH Vitreous hemorrhage; PRP panretinal laser photocoagulation; IVK intravitreal kenalog; VMT vitreomacular traction; MH Macular hole;  NVD neovascularization of the disc; NVE  neovascularization elsewhere; AREDS age related eye disease study; ARMD age related macular degeneration; POAG primary open angle glaucoma; EBMD epithelial/anterior basement membrane dystrophy; ACIOL anterior chamber intraocular lens; IOL intraocular lens; PCIOL posterior chamber intraocular lens; Phaco/IOL phacoemulsification with intraocular lens placement; [place] photorefractive keratectomy; LASIK laser assisted in situ keratomileusis; HTN hypertension; DM diabetes mellitus; COPD chronic obstructive pulmonary disease

## 2020-04-29 ENCOUNTER — Encounter (INDEPENDENT_AMBULATORY_CARE_PROVIDER_SITE_OTHER): Payer: Self-pay | Admitting: Ophthalmology

## 2020-04-29 ENCOUNTER — Ambulatory Visit (INDEPENDENT_AMBULATORY_CARE_PROVIDER_SITE_OTHER): Payer: Medicare Other | Admitting: Ophthalmology

## 2020-04-29 ENCOUNTER — Other Ambulatory Visit: Payer: Self-pay

## 2020-04-29 DIAGNOSIS — H35033 Hypertensive retinopathy, bilateral: Secondary | ICD-10-CM

## 2020-04-29 DIAGNOSIS — H3582 Retinal ischemia: Secondary | ICD-10-CM | POA: Diagnosis not present

## 2020-04-29 DIAGNOSIS — H3581 Retinal edema: Secondary | ICD-10-CM | POA: Diagnosis not present

## 2020-04-29 DIAGNOSIS — Z961 Presence of intraocular lens: Secondary | ICD-10-CM

## 2020-04-29 DIAGNOSIS — H3521 Other non-diabetic proliferative retinopathy, right eye: Secondary | ICD-10-CM | POA: Diagnosis not present

## 2020-04-29 DIAGNOSIS — H43812 Vitreous degeneration, left eye: Secondary | ICD-10-CM

## 2020-04-29 DIAGNOSIS — I10 Essential (primary) hypertension: Secondary | ICD-10-CM

## 2020-05-03 DIAGNOSIS — N179 Acute kidney failure, unspecified: Secondary | ICD-10-CM | POA: Diagnosis not present

## 2020-05-03 DIAGNOSIS — I5022 Chronic systolic (congestive) heart failure: Secondary | ICD-10-CM | POA: Diagnosis not present

## 2020-05-03 DIAGNOSIS — D631 Anemia in chronic kidney disease: Secondary | ICD-10-CM | POA: Diagnosis not present

## 2020-05-03 DIAGNOSIS — N184 Chronic kidney disease, stage 4 (severe): Secondary | ICD-10-CM | POA: Diagnosis not present

## 2020-05-03 DIAGNOSIS — E872 Acidosis: Secondary | ICD-10-CM | POA: Diagnosis not present

## 2020-05-03 DIAGNOSIS — Z720 Tobacco use: Secondary | ICD-10-CM | POA: Diagnosis not present

## 2020-05-09 NOTE — Progress Notes (Signed)
[facility] Note  05/14/2020     CHIEF COMPLAINT Patient presents for Retina Follow Up   HISTORY OF PRESENT ILLNESS: [name] [name] is a 69 y.o. female who presents to the clinic today for:   HPI    Retina Follow Up    Patient presents with  PVD.  In left eye.  This started weeks ago.  Severity is moderate.  Duration of 2 weeks.  Since onset it is stable.  I, the attending physician,  performed the HPI with the patient and updated documentation appropriately.          Comments    69 y/o female pt here for 2 wk f/u for hemorrhagic PVD OS.  No change in [place] OU.  Denies pain, FOL, floaters.  No gtts.       Last edited by Bernarda Caffey, MD on 05/14/2020  9:22 AM. (History)    pt states her left eye vision is clearing up, but is still slightly blurry, she denies fol  Referring physician: Shirleen Schirmer, PA-C     HISTORICAL INFORMATION:   Selected notes from the MEDICAL RECORD NUMBER Referred by Shirleen Schirmer for concern of PVD / vitreous hemorrhage   CURRENT MEDICATIONS: No current outpatient medications on file. (Ophthalmic Drugs)   No current facility-administered medications for this visit. (Ophthalmic Drugs)   Current Outpatient Medications (Other)  Medication Sig  . ascorbic acid (VITAMIN C) 500 MG tablet Take by mouth.  Marland Kitchen atorvastatin (LIPITOR) 40 MG tablet Take 1 tablet (40 mg total) by mouth daily. Stop taking simvastatin.  Marland Kitchen BIDIL 20-37.5 MG tablet Take 1 tablet by mouth 3 (three) times daily.  . calcium citrate (CALCITRATE - DOSED IN MG ELEMENTAL CALCIUM) 950 (200 Ca) MG tablet Take by mouth daily.  . carvedilol (COREG) 6.25 MG tablet Take 1 tablet (6.25 mg total) by mouth 2 (two) times daily with a meal.  . cholecalciferol (VITAMIN D) 1000 units tablet Take 1,000 Units by mouth daily.  . clopidogrel (PLAVIX) 75 MG tablet Take 1 tablet (75 mg total) by mouth daily.  . Ferrous Fumarate 86 (27 Fe) MG CAPS Take by mouth daily.  .  hydrALAZINE (APRESOLINE) 25 MG tablet Take 1 tablet (25 mg total) by mouth 3 (three) times daily.  . isosorbide mononitrate (IMDUR) 30 MG 24 hr tablet Take 0.5 tablets (15 mg total) by mouth daily.  Marland Kitchen lidocaine (XYLOCAINE) 2 % solution Use as directed 20 mLs in the mouth or throat as needed for mouth pain.  Marland Kitchen LORazepam (ATIVAN) 0.5 MG tablet TAKE ONE TABLET BY MOUTH TWICE DAILY AS NEEDED FOR ANXIETY  . Mesalamine 800 MG TBEC TAKE ONE TABLET BY MOUTH THREE TIMES DAILY  . mirtazapine (REMERON) 15 MG tablet Take 1 tablet (15 mg total) by mouth at bedtime.  . Multiple Vitamin (MULTIVITAMIN) tablet Take 1 tablet by mouth 2 (two) times daily.  . pregabalin (LYRICA) 25 MG capsule Take 1 capsule (25 mg total) by mouth 2 (two) times daily.  . sodium bicarbonate 650 MG tablet Take 1,300 mg by mouth 2 (two) times daily.  Marland Kitchen triamcinolone ointment (KENALOG) 0.1 % Apply 1 application topically 2 (two) times daily. Apply to skin around stoma   No current facility-administered medications for this visit. (Other)      REVIEW OF SYSTEMS: ROS    Positive for: Neurological, Genitourinary, Musculoskeletal, Cardiovascular, Eyes   Negative for: Constitutional, Gastrointestinal, Skin, HENT, Endocrine, Respiratory, Psychiatric, Allergic/Imm, Heme/Lymph   Last edited by  Matthew Folks, COA on 05/14/2020  8:57 AM. (History)       ALLERGIES Allergies  Allergen Reactions  . Amoxicillin Anaphylaxis, Rash and Other (See Comments)    Throat Swelling  . Aspirin Anaphylaxis, Itching and Rash  . Morphine And Related Anaphylaxis  . Penicillins Anaphylaxis and Rash  . Gabapentin Other (See Comments)    Patient had one time seizure shortly after stopping gabapentin  . Ciprofloxacin Swelling    Possible reaction to cipro or clindamycin 04/29/14  . Ace Inhibitors Other (See Comments) and Cough    ACE possibly associated with cough and switched to ARB.  Would be OK to re-challenge if needed.    PAST MEDICAL  HISTORY Past Medical History:  Diagnosis Date  . Abnormal chest CT    Coronary atherosclerosis on chest CT 2012  . Anemia, chronic renal failure   . Anxiety   . Asthma 05/2011  . CAD (coronary artery disease)   . Carpal tunnel syndrome   . CHF (congestive heart failure) (HCC)    EF 30-35% 2012->EF 60-65% 2013  . CKD (chronic kidney disease), stage III ([place])   . Crohn's disease ([place])   . GERD (gastroesophageal reflux disease)   . Headache(784.0)    "related to high BP" (05/30/2012)  . 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 Hyperlipidemia   . Hypertension   . Hypertensive retinopathy    OU  . Osteopenia   . Panic attacks   . Reflux esophagitis   . Seizure ([place])    hx of  . Tobacco abuse   . Vitamin D deficiency    Past Surgical History:  Procedure Laterality Date  . CATARACT EXTRACTION Bilateral    Dr. Clent Jacks  . CATARACT EXTRACTION W/ INTRAOCULAR LENS  IMPLANT, BILATERAL  ~ 2000  . CHOLECYSTECTOMY  01/28/2005  . COLOSTOMY  03/1996   diverting  . ECTOPIC PREGNANCY SURGERY  ?1980's   left  . EYE SURGERY Bilateral    Cat Sx - Dr. Clent Jacks  . ILEOSTOMY  ?  2002  . NECK SURGERY  2020   "pinched nerve"    FAMILY HISTORY Family History  Problem Relation Age of Onset  . Heart disease Mother   . Other Mother        Covid  . Glaucoma Mother   . Stroke Father   . Other Sister        AIDS    SOCIAL HISTORY Social History   Tobacco Use  . Smoking status: Current Every Day Smoker    Packs/day: 0.12    Years: 30.00    Pack years: 3.60    Types: Cigarettes  . Smokeless tobacco: Never Used  Substance Use Topics  . Alcohol use: No    Comment: 05/30/2012 "used to drink back in the day; last alcohol 23 yr ago"  . Drug use: No         OPHTHALMIC EXAM:  Base Eye Exam    Visual Acuity (Snellen - Linear)      Right Left   Dist [facility] 20/25 -2 20/25 +   Dist ph [employer] NI NI       Tonometry (Tonopen, 9:01 AM)      Right Left   Pressure 13 15       Pupils       Dark Light Shape React APD   Right 3 2 Round Slow None   Left 3 2 Round Slow None       Visual  Fields (Counting fingers)      Left Right    Full Full       Extraocular Movement      Right Left    Full, Ortho Full, Ortho       Neuro/Psych    Oriented x3: Yes   Mood/Affect: Normal       Dilation    Both eyes: 1.0% Mydriacyl, 2.5% Phenylephrine @ 9:01 AM        Slit Lamp and Fundus Exam    Slit Lamp Exam      Right Left   Lids/Lashes Dermatochalasis - upper lid Dermatochalasis - upper lid   Conjunctiva/Sclera White and quiet Melanosis, nasal pinguecula   Cornea arcus, Debris in tear film, 1+ Punctate epithelial erosions, EBMD arcus, Debris in tear film, 1+ Punctate epithelial erosions, EBMD   Anterior Chamber Deep and quiet Deep and quiet   Iris Round and dilated Round and dilated   Lens 3 piece PC IOL in good position, +phimosis and fibrosis, open PC PC IOL in good position   Vitreous Vitreous syneresis Vitreous syneresis, Posterior vitreous detachment, blood stained vitreous condensations - improving, pre-retinal heme settled inferiorly - improving       Fundus Exam      Right Left   Disc mild Pallor, Sharp rim Mild Pallor, Sharp rim   C/D Ratio 0.3 0.5   Macula Flat, Blunted foveal reflex, RPE mottling and clumping, No heme or edema Flat, Blunted foveal reflex, RPE mottling, No heme or edema   Vessels attenuated, peripheral sclerosis temporal periphery attenuated, Tortuous   Periphery Attached, No heme, No RT/RD Attached, No RT/RD; mild preretinal heme settled inferiorly - improving          IMAGING AND PROCEDURES  Imaging and Procedures for 05/14/2020  OCT, Retina - OU - Both Eyes       Right Eye Quality was good. Central Foveal Thickness: 259. Progression has been stable. Findings include normal foveal contour, no IRF, no SRF (Partial PVD).   Left Eye Quality was good. Central Foveal Thickness: 285. Progression has improved. Findings include normal  foveal contour, no IRF, no SRF (Vitreous opacities -- slightly improved).   Notes *Images captured and stored on drive  Diagnosis / Impression:  NFP; no IRF/SRF OU OD: partial PVD OS: +Vit opacities -- slightly improved  Clinical management:  See below  Abbreviations: NFP - Normal foveal profile. CME - cystoid macular edema. PED - pigment epithelial detachment. IRF - intraretinal fluid. SRF - subretinal fluid. EZ - ellipsoid zone. ERM - epiretinal membrane. ORA - outer retinal atrophy. ORT - outer retinal tubulation. SRHM - subretinal hyper-reflective material. IRHM - intraretinal hyper-reflective material                 ASSESSMENT/PLAN:    ICD-10-CM   1. Posterior vitreous detachment of left eye  H43.812   2. Retinal edema  H35.81 OCT, Retina - OU - Both Eyes  3. Proliferative retinopathy of right eye  H35.21   4. Retinal ischemia  H35.82   5. Essential hypertension  I10   6. Hypertensive retinopathy of both eyes  H35.033   7. Pseudophakia, both eyes  Z96.1     1,2. Hemorrhagic PVD OS  - pt presented to [facility] on 10[facility]2021  - Discussed findings and prognosis  - No RT or RD on 360 scleral depressed exam OS  - Reviewed s/s of RT/RD  - Strict return precautions for any such RT/RD signs/symptoms  - pt is on  plavix  - VH improving  - VH precautions reviewed -- minimize activities, keep head elevated, avoid ASA/NSAIDs/blood thinners as able  - F/U 1 week, sooner prn -- DFE/OCT  3,4. Proliferative retinopathy, non diabetic OD; retinal ischemia OU  - FA 10.25.21 shows   OD - focal area of peripheral nonperfusion with leaking NV, sup temp periphery   OS - focal area of peripheral nonperfusion with leaking MA, inf temp periphery  - f/u November 16 at 10:00AM for PRP OD  5,6. Hypertensive retinopathy OU - discussed importance of tight BP control - monitor  7. Pseudophakia OU  - s/p CE/IOL OU (Dr. Elliot Dally)  - IOLs in good position, doing well  -  monitor   Ophthalmic Meds Ordered this visit:  No orders of the defined types were placed in this encounter.      Return in about 1 week (around 05/21/2020) for f/u PDR OU, DFE, OCT -- PRP OD.  There are no Patient Instructions on file for this visit.  This document serves as a record of services personally performed by Gardiner Sleeper, MD, PhD. It was created on their behalf by Estill Bakes, COT an ophthalmic technician. The creation of this record is the provider's dictation and/or activities during the visit.    Electronically signed by: Estill Bakes, COT 11.4.21 @ 1:09 AM   This document serves as a record of services personally performed by Gardiner Sleeper, MD, PhD. It was created on their behalf by San Jetty. Owens Shark, OA an ophthalmic technician. The creation of this record is the provider's dictation and/or activities during the visit.    Electronically signed by: San Jetty. Owens Shark, [place] 11.09.2021 1:09 AM  Gardiner Sleeper, [name], Ph.D. Diseases & Surgery of the Retina and [place] 05/14/2020   I have reviewed the above documentation for accuracy and completeness, and I agree with the above. Gardiner Sleeper, [name], Ph.D. 05/19/20 1:09 AM   Abbreviations: M myopia (nearsighted); A astigmatism; H hyperopia (farsighted); P presbyopia; Mrx spectacle prescription;  CTL contact lenses; OD right eye; OS left eye; OU both eyes  XT exotropia; ET esotropia; PEK punctate epithelial keratitis; PEE punctate epithelial erosions; DES dry eye syndrome; MGD meibomian gland dysfunction; ATs artificial tears; PFAT's preservative free artificial tears; [place] nuclear sclerotic cataract; PSC posterior subcapsular cataract; ERM epi-retinal membrane; PVD posterior vitreous detachment; RD retinal detachment; DM diabetes mellitus; DR diabetic retinopathy; NPDR non-proliferative diabetic retinopathy; PDR proliferative diabetic retinopathy; CSME clinically significant macular  edema; DME diabetic macular edema; dbh dot blot hemorrhages; CWS cotton wool spot; POAG primary open angle glaucoma; C/D cup-to-disc ratio; HVF humphrey visual field; GVF goldmann visual field; OCT optical coherence tomography; IOP intraocular pressure; BRVO Branch retinal vein occlusion; CRVO central retinal vein occlusion; CRAO central retinal artery occlusion; BRAO branch retinal artery occlusion; RT retinal tear; SB scleral buckle; PPV pars plana vitrectomy; VH Vitreous hemorrhage; PRP panretinal laser photocoagulation; IVK intravitreal kenalog; VMT vitreomacular traction; MH Macular hole;  NVD neovascularization of the disc; NVE neovascularization elsewhere; AREDS age related eye disease study; ARMD age related macular degeneration; POAG primary open angle glaucoma; EBMD epithelial/anterior basement membrane dystrophy; ACIOL anterior chamber intraocular lens; IOL intraocular lens; PCIOL posterior chamber intraocular lens; Phaco/IOL phacoemulsification with intraocular lens placement; [place] photorefractive keratectomy; LASIK laser assisted in situ keratomileusis; HTN hypertension; DM diabetes mellitus; COPD chronic obstructive pulmonary disease

## 2020-05-10 ENCOUNTER — Other Ambulatory Visit: Payer: Self-pay | Admitting: Family Medicine

## 2020-05-14 ENCOUNTER — Other Ambulatory Visit: Payer: Self-pay

## 2020-05-14 ENCOUNTER — Ambulatory Visit (INDEPENDENT_AMBULATORY_CARE_PROVIDER_SITE_OTHER): Payer: Medicare Other | Admitting: Ophthalmology

## 2020-05-14 ENCOUNTER — Encounter (INDEPENDENT_AMBULATORY_CARE_PROVIDER_SITE_OTHER): Payer: Self-pay | Admitting: Ophthalmology

## 2020-05-14 DIAGNOSIS — H3521 Other non-diabetic proliferative retinopathy, right eye: Secondary | ICD-10-CM | POA: Diagnosis not present

## 2020-05-14 DIAGNOSIS — H3581 Retinal edema: Secondary | ICD-10-CM | POA: Diagnosis not present

## 2020-05-14 DIAGNOSIS — H3582 Retinal ischemia: Secondary | ICD-10-CM | POA: Diagnosis not present

## 2020-05-14 DIAGNOSIS — I10 Essential (primary) hypertension: Secondary | ICD-10-CM | POA: Diagnosis not present

## 2020-05-14 DIAGNOSIS — Z961 Presence of intraocular lens: Secondary | ICD-10-CM

## 2020-05-14 DIAGNOSIS — H43812 Vitreous degeneration, left eye: Secondary | ICD-10-CM

## 2020-05-14 DIAGNOSIS — H35033 Hypertensive retinopathy, bilateral: Secondary | ICD-10-CM

## 2020-05-21 ENCOUNTER — Encounter (INDEPENDENT_AMBULATORY_CARE_PROVIDER_SITE_OTHER): Payer: Medicare Other | Admitting: Ophthalmology

## 2020-05-21 DIAGNOSIS — H3582 Retinal ischemia: Secondary | ICD-10-CM

## 2020-05-21 DIAGNOSIS — H3581 Retinal edema: Secondary | ICD-10-CM

## 2020-05-21 DIAGNOSIS — Z961 Presence of intraocular lens: Secondary | ICD-10-CM

## 2020-05-21 DIAGNOSIS — H35033 Hypertensive retinopathy, bilateral: Secondary | ICD-10-CM

## 2020-05-21 DIAGNOSIS — I10 Essential (primary) hypertension: Secondary | ICD-10-CM

## 2020-05-21 DIAGNOSIS — H3521 Other non-diabetic proliferative retinopathy, right eye: Secondary | ICD-10-CM

## 2020-05-21 DIAGNOSIS — H43812 Vitreous degeneration, left eye: Secondary | ICD-10-CM

## 2020-05-22 NOTE — Progress Notes (Deleted)
Cardiology Office Note:   Date:  05/22/2020  NAME:  [name] [name]    MRN: [id number] DOB:  08-31-1950   PCP:  Zenia Resides, MD  Cardiologist:  Evalina Field, MD  Electrophysiologist:  None   Referring MD: Zenia Resides, MD   No chief complaint on file. ***  History of Present Illness:   [name] [name] is a 69 y.o. female with a hx of systolic HF, CKD IV, HTN, tobacco abuse who presents for follow-up. Started on hydralazine/imdur at last visit.   Problem List 1.Systolic HF, EF 01-65% -EFrecovered in past35% ->60%, now back to 30% -MPI 09/28/2019 -> normal on my review  -EF25-30% 07/2019 2. CKD IV -EGFR 28 3. Hyperlipidemiatotal cholesterol -183, HDL 55, LDL 106, triglycerides 121 4. Tobacco abuse -every day smoker -40 pack-years  Past Medical History: Past Medical History:  Diagnosis Date  . Abnormal chest CT    Coronary atherosclerosis on chest CT 2012  . Anemia, chronic renal failure   . Anxiety   . Asthma 05/2011  . CAD (coronary artery disease)   . Carpal tunnel syndrome   . CHF (congestive heart failure) (HCC)    EF 30-35% 2012->EF 60-65% 2013  . CKD (chronic kidney disease), stage III ([place])   . Crohn's disease ([place])   . GERD (gastroesophageal reflux disease)   . Headache(784.0)    "related to high BP" (05/30/2012)  . 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 Hyperlipidemia   . Hypertension   . Hypertensive retinopathy    OU  . Osteopenia   . Panic attacks   . Reflux esophagitis   . Seizure ([place])    hx of  . Tobacco abuse   . Vitamin D deficiency     Past Surgical History: Past Surgical History:  Procedure Laterality Date  . CATARACT EXTRACTION Bilateral    Dr. Clent Jacks  . CATARACT EXTRACTION W/ INTRAOCULAR LENS  IMPLANT, BILATERAL  ~ 2000  . CHOLECYSTECTOMY  01/28/2005  . COLOSTOMY  03/1996   diverting  . ECTOPIC PREGNANCY SURGERY  ?1980's   left  . EYE SURGERY Bilateral    Cat Sx - Dr. Clent Jacks  . ILEOSTOMY  ?  2002  .  NECK SURGERY  2020   "pinched nerve"    Current Medications: No outpatient medications have been marked as taking for the 05/23/20 encounter (Appointment) with Geralynn Rile, MD.     Allergies:    Amoxicillin, Aspirin, Morphine and related, Penicillins, Gabapentin, Ciprofloxacin, and Ace inhibitors   Social History: Social History   Socioeconomic History  . Marital status: Single    Spouse name: Not on file  . Number of children: 1  . Years of education: associate  . Highest education level: Not on file  Occupational History  . Occupation: Retired-cook    Employer: [employer]  Tobacco Use  . Smoking status: Current Every Day Smoker    Packs/day: 0.12    Years: 30.00    Pack years: 3.60    Types: Cigarettes  . Smokeless tobacco: Never Used  Substance and Sexual Activity  . Alcohol use: No    Comment: 05/30/2012 "used to drink back in the day; last alcohol 23 yr ago"  . Drug use: No  . Sexual activity: Not Currently  Other Topics Concern  . Not on file  Social History Narrative   Health Care POA:    Emergency Contact: partner, [name] [name], [phone]   End of Life Plan:  Who lives with you: no one 09/13/19, no pets   Any pets: cat- [place] Thing   Diet: pt has a varied diet of protein, starch, and vegetables    Exercise: Pt does not have any regular exercise routine   Seatbelts:Pt reports wearing seatbelt when in vehicles.    [name] [name] Exposure/Protection:    Hobbies: cooking, bingo, basketball         Social Determinants of Radio broadcast assistant Strain:   . Difficulty of Paying Living Expenses: Not on file  Food Insecurity:   . Worried About Charity fundraiser in the Last Year: Not on file  . Ran Out of Food in the Last Year: Not on file  Transportation Needs:   . Lack of Transportation (Medical): Not on file  . Lack of Transportation (Non-Medical): Not on file  Physical Activity:   . Days of Exercise per Week: Not on file  . Minutes of  Exercise per Session: Not on file  Stress:   . Feeling of Stress : Not on file  Social Connections:   . Frequency of Communication with Friends and Family: Not on file  . Frequency of Social Gatherings with Friends and Family: Not on file  . Attends Religious Services: Not on file  . Active Member of Clubs or Organizations: Not on file  . Attends Archivist Meetings: Not on file  . Marital Status: Not on file     Family History: The patient's ***family history includes Glaucoma in her mother; Heart disease in her mother; Other in her mother and sister; Stroke in her father.  ROS:   All other ROS reviewed and negative. Pertinent positives noted in the HPI.     EKGs/Labs/Other Studies Reviewed:   The following studies were personally reviewed by me today:  EKG:  EKG is *** ordered today.  The ekg ordered today demonstrates ***, and was personally reviewed by me.   TTE 08/17/2019 1. Severe global reduction in LV systolic function; grade 1 diastolic  dysfunction; mild AI; moderate, eccentric, posteriorly directed MR  suggesting prolapse of anterior MV leaflet; mild LAE.  2. Left ventricular ejection fraction, by estimation, is 25 to 30%. The  left ventricle has severely decreased function. The left ventrical  demonstrates global hypokinesis. Left ventricular diastolic parameters are  consistent with Grade I diastolic  dysfunction (impaired relaxation). Elevated left atrial pressure.  3. Right ventricular systolic function is normal. The right ventricular  size is normal.  4. Left atrial size was mildly dilated.  5. The mitral valve is normal in structure and function. Moderate mitral  valve regurgitation. No evidence of mitral stenosis.  6. The aortic valve is tricuspid. Aortic valve regurgitation is mild.  Mild to moderate aortic valve sclerosis/calcification is present, without  any evidence of aortic stenosis.  7. The inferior vena cava is normal in size with  greater than 50%  respiratory variability, suggesting right atrial pressure of 3 mmHg.   NM Stress 09/2019  EKG not interpretable due to resting ST depressions  The left ventricular ejection fraction is moderately decreased (30-44%).  Nuclear stress EF: 35%.  Defect 1: There is a medium defect of mild severity present in the basal anteroseptal, mid anteroseptal, apical anterior and apex location.  Defect 2: There is a small defect of mild severity present in the basal inferior, mid inferior and apical inferior location.  Findings consistent with ischemia and prior myocardial infarction.  This is an intermediate risk study due to moderately  decreased systolic function. Small amount of ischemia   1. Fixed defect consistent with infarct in apex, apical anterior/inferior, and mid to basal anteroseptal walls 2. Reversible defect consistent with ischemia in basal to mid inferior wall consistent with ischemia.   Recent Labs: 06/14/2019: ALT 16 09/15/2019: TSH 0.851 01/04/2020: Hemoglobin 10.2; Platelets 175 03/25/2020: BUN 29; Creatinine, Ser 2.78; Potassium 4.1; Sodium 143   Recent Lipid Panel    Component Value Date/Time   CHOL 183 06/14/2019 1553   TRIG 121 06/14/2019 1553   HDL 55 06/14/2019 1553   CHOLHDL 3.3 06/14/2019 1553   CHOLHDL 3.9 03/19/2016 1200   VLDL 36 (H) 03/19/2016 1200   LDLCALC 106 (H) 06/14/2019 1553   LDLDIRECT 73 03/25/2020 1112   LDLDIRECT 129 (H) 07/23/2006 2031    Physical Exam:   VS:  There were no vitals taken for this visit.   Wt Readings from Last 3 Encounters:  04/15/20 117 lb 3.2 oz (53.2 kg)  04/10/20 116 lb 9.6 oz (52.9 kg)  03/25/20 112 lb 4.8 oz (50.9 kg)    General: Well nourished, well developed, in no acute distress Heart: Atraumatic, normal size  Eyes: PEERLA, EOMI  Neck: Supple, no JVD Endocrine: No thryomegaly Cardiac: Normal S1, S2; RRR; no murmurs, rubs, or gallops Lungs: Clear to auscultation bilaterally, no wheezing, rhonchi or  rales  Abd: Soft, nontender, no hepatomegaly  Ext: No edema, pulses 2+ Musculoskeletal: No deformities, BUE and BLE strength normal and equal Skin: Warm and dry, no rashes   Neuro: Alert and oriented to person, place, time, and situation, CNII-XII grossly intact, no focal deficits  Psych: Normal mood and affect   ASSESSMENT:   [name] [name] is a 69 y.o. female who presents for the following: No diagnosis found.  PLAN:   There are no diagnoses linked to this encounter.  Disposition: No follow-ups on file.  Medication Adjustments/Labs and Tests Ordered: Current medicines are reviewed at length with the patient today.  Concerns regarding medicines are outlined above.  No orders of the defined types were placed in this encounter.  No orders of the defined types were placed in this encounter.   There are no Patient Instructions on file for this visit.   Time Spent with Patient: I have spent a total of *** minutes with patient reviewing hospital notes, telemetry, EKGs, labs and examining the patient as well as establishing an assessment and plan that was discussed with the patient.  > 50% of time was spent in direct patient care.  Signed, Addison Naegeli. Audie Box, [place]  [address] [place], [place] [address] [phone]  05/22/2020 8:40 PM

## 2020-05-23 ENCOUNTER — Ambulatory Visit: Payer: Medicare Other | Admitting: Cardiovascular Disease

## 2020-05-23 DIAGNOSIS — I10 Essential (primary) hypertension: Secondary | ICD-10-CM

## 2020-05-23 DIAGNOSIS — E782 Mixed hyperlipidemia: Secondary | ICD-10-CM

## 2020-05-23 DIAGNOSIS — I5022 Chronic systolic (congestive) heart failure: Secondary | ICD-10-CM

## 2020-05-28 ENCOUNTER — Encounter (INDEPENDENT_AMBULATORY_CARE_PROVIDER_SITE_OTHER): Payer: Medicare Other | Admitting: Ophthalmology

## 2020-05-28 DIAGNOSIS — H3582 Retinal ischemia: Secondary | ICD-10-CM

## 2020-05-28 DIAGNOSIS — H35033 Hypertensive retinopathy, bilateral: Secondary | ICD-10-CM

## 2020-05-28 DIAGNOSIS — H43812 Vitreous degeneration, left eye: Secondary | ICD-10-CM

## 2020-05-28 DIAGNOSIS — H3521 Other non-diabetic proliferative retinopathy, right eye: Secondary | ICD-10-CM

## 2020-05-28 DIAGNOSIS — I10 Essential (primary) hypertension: Secondary | ICD-10-CM

## 2020-05-28 DIAGNOSIS — H3581 Retinal edema: Secondary | ICD-10-CM

## 2020-05-28 DIAGNOSIS — Z961 Presence of intraocular lens: Secondary | ICD-10-CM

## 2020-06-08 ENCOUNTER — Other Ambulatory Visit: Payer: Self-pay | Admitting: Cardiovascular Disease

## 2020-06-17 DIAGNOSIS — Z933 Colostomy status: Secondary | ICD-10-CM | POA: Diagnosis not present

## 2020-06-17 DIAGNOSIS — K5090 Crohn's disease, unspecified, without complications: Secondary | ICD-10-CM | POA: Diagnosis not present

## 2020-06-18 ENCOUNTER — Telehealth: Payer: Self-pay

## 2020-06-18 ENCOUNTER — Other Ambulatory Visit: Payer: Self-pay | Admitting: Family Medicine

## 2020-06-18 ENCOUNTER — Telehealth: Payer: Self-pay | Admitting: Cardiovascular Disease

## 2020-06-18 DIAGNOSIS — K5090 Crohn's disease, unspecified, without complications: Secondary | ICD-10-CM | POA: Diagnosis not present

## 2020-06-18 DIAGNOSIS — R200 Anesthesia of skin: Secondary | ICD-10-CM

## 2020-06-18 DIAGNOSIS — Z933 Colostomy status: Secondary | ICD-10-CM | POA: Diagnosis not present

## 2020-06-18 NOTE — Telephone Encounter (Signed)
New message       pharmacy calling stating that they need to speak with some concering  Isosorbide.

## 2020-06-18 NOTE — Telephone Encounter (Signed)
Patient calls nurse line requesting to speak with PCP. Patient has some concerns and only wants to see/speak with PCP. PCP has no availability until January. I have scheduled her an apt for 07/11/2020. Please call her when you get a chance.

## 2020-06-19 ENCOUNTER — Other Ambulatory Visit: Payer: Self-pay

## 2020-06-19 MED ORDER — ISOSORBIDE MONONITRATE ER 30 MG PO TB24
30.0000 mg | ORAL_TABLET | Freq: Every day | ORAL | 3 refills | Status: DC
Start: 2020-06-19 — End: 2021-05-19

## 2020-06-19 NOTE — Telephone Encounter (Signed)
Tried again, LM again.

## 2020-06-19 NOTE — Telephone Encounter (Signed)
Attempted to call.  LM that I would call back.

## 2020-06-19 NOTE — Telephone Encounter (Signed)
Called, no answer.  LM that I did not plan to call again.  She needs to leave a new message if she still needs to talk with me.

## 2020-06-19 NOTE — Telephone Encounter (Signed)
Spoke with pharmacy about the patients concern for her imdur. Patient states she cant cut the 30 mg of imdur in half because it is so small and wanted to know if we coould send in a smaller script so the patient wouldn't have to cut it in have.

## 2020-06-19 NOTE — Telephone Encounter (Signed)
What is the concern?  [place] T. Audie Box, [place]  [address] [place], [place] [address] [phone]  11:20 AM

## 2020-06-20 DIAGNOSIS — K5090 Crohn's disease, unspecified, without complications: Secondary | ICD-10-CM | POA: Diagnosis not present

## 2020-06-20 DIAGNOSIS — Z933 Colostomy status: Secondary | ICD-10-CM | POA: Diagnosis not present

## 2020-06-24 ENCOUNTER — Encounter (INDEPENDENT_AMBULATORY_CARE_PROVIDER_SITE_OTHER): Payer: Medicare Other | Admitting: Ophthalmology

## 2020-07-08 ENCOUNTER — Other Ambulatory Visit: Payer: Self-pay | Admitting: Cardiovascular Disease

## 2020-07-11 ENCOUNTER — Ambulatory Visit: Payer: Medicare Other | Admitting: Family Medicine

## 2020-07-22 ENCOUNTER — Ambulatory Visit: Payer: Medicare Other | Admitting: Family Medicine

## 2020-08-03 IMAGING — US US EXTREM UP*R* COMP
1 series · 14 of 25 positions shown · non-contrast
Comparison: None.

CLINICAL DATA: Numbness in the fingers of the right hand.

EXAM:
ULTRASOUND RIGHT UPPER EXTREMITY COMPLETE
TECHNIQUE: Ultrasound examination was performed including evaluation of the
muscles, tendons, joint, and adjacent soft tissues.

[Series 1: us extrem up*right* comp · 0.06mm/px · 28 acquisitions, 14 frames shown]
[im 1/28]
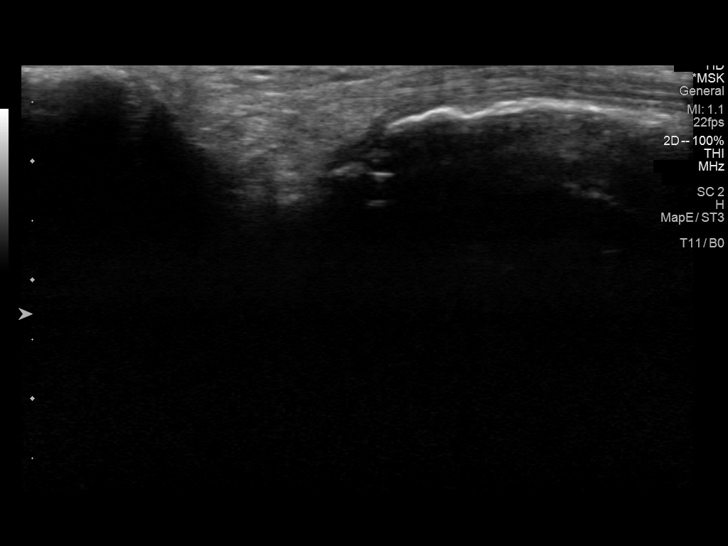
[im 3/28]
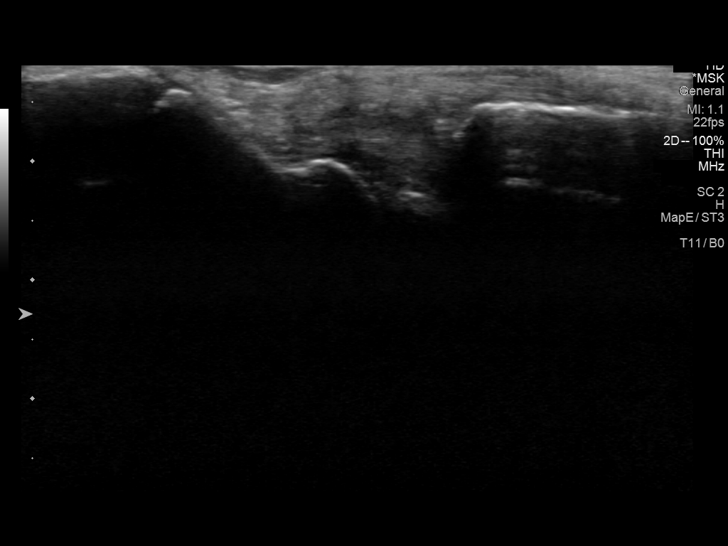
[im 5/28]
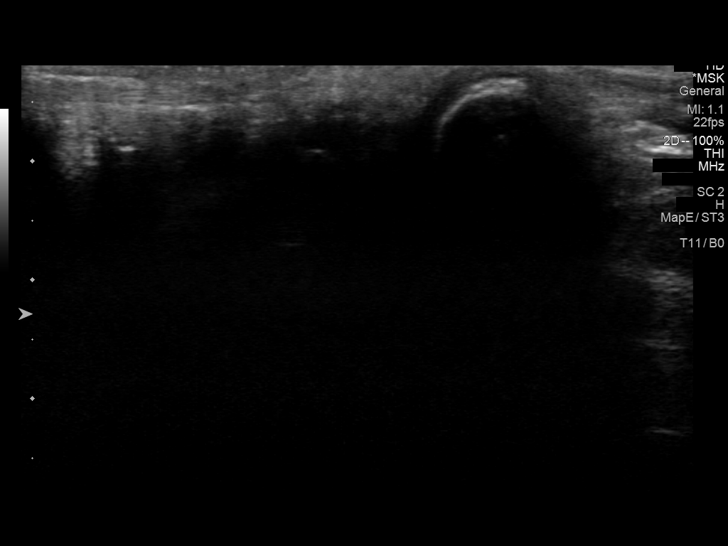
[im 7/28]
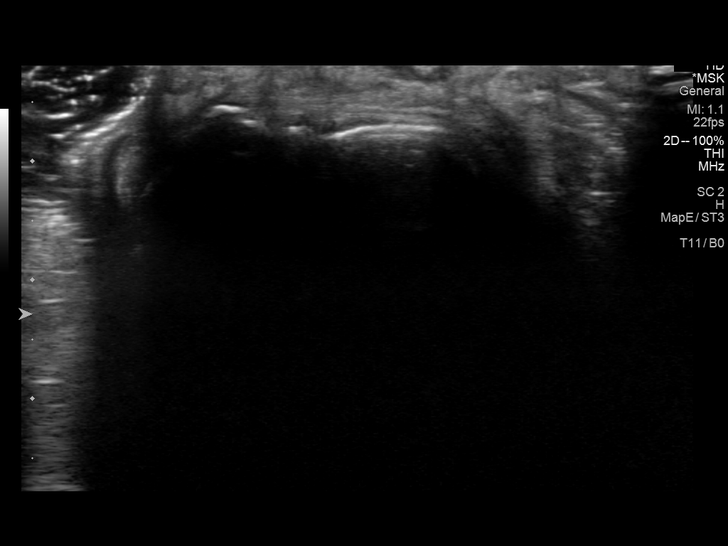
[im 10/28]
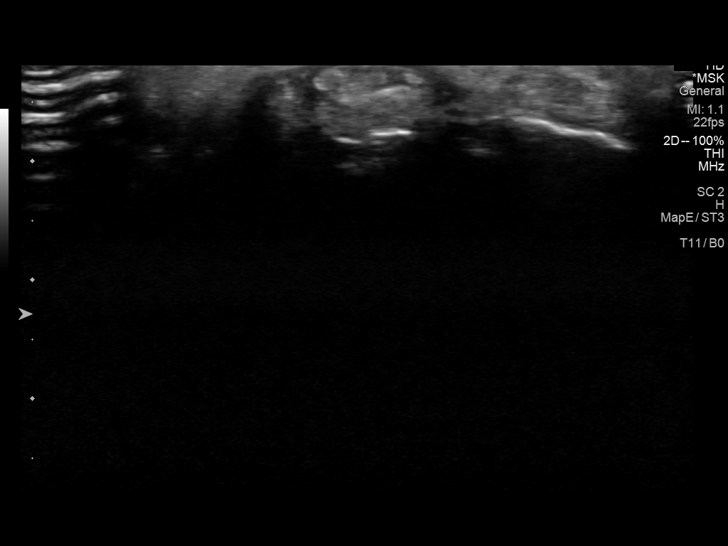
[im 11/28]
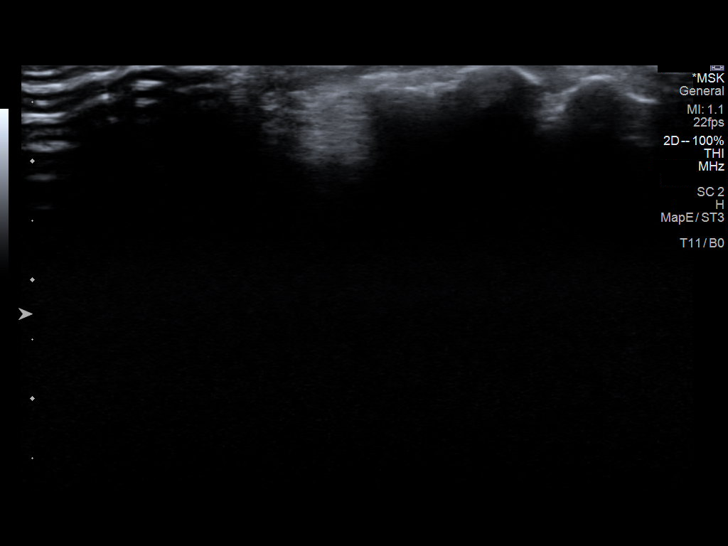
[im 13/28]
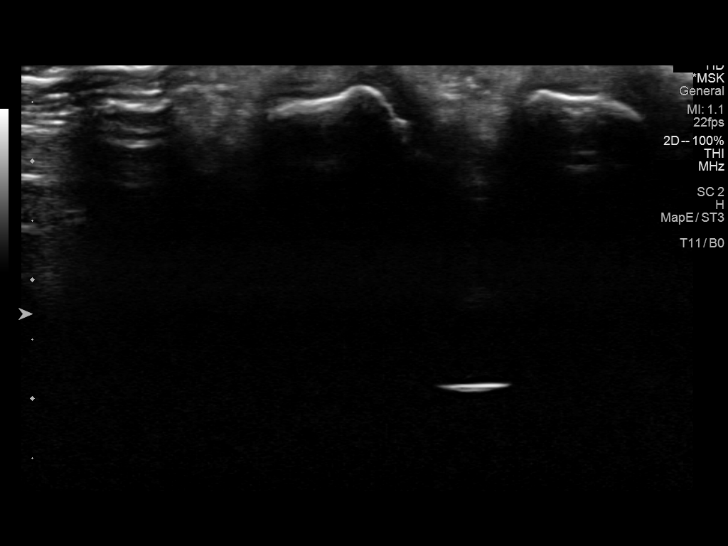
[im 15/28]
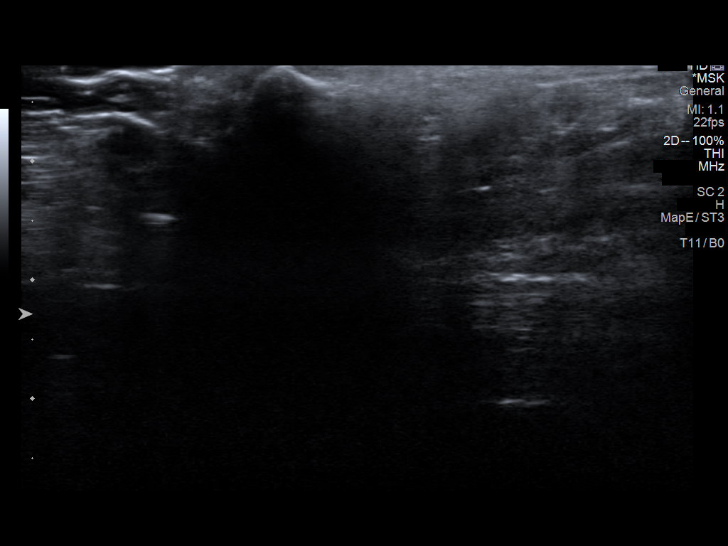
[im 17/28]
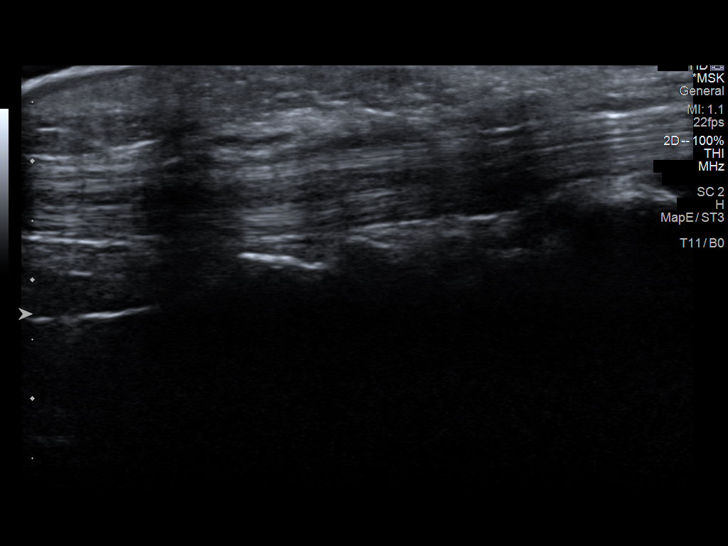
[im 19/28]
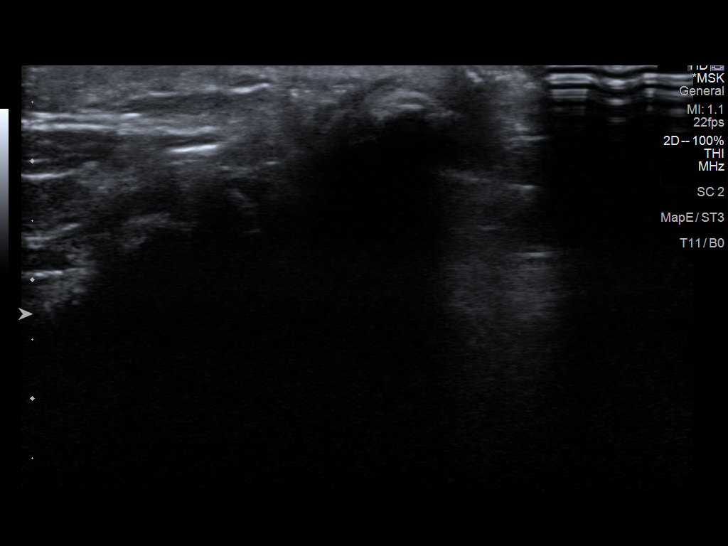
[im 21/28]
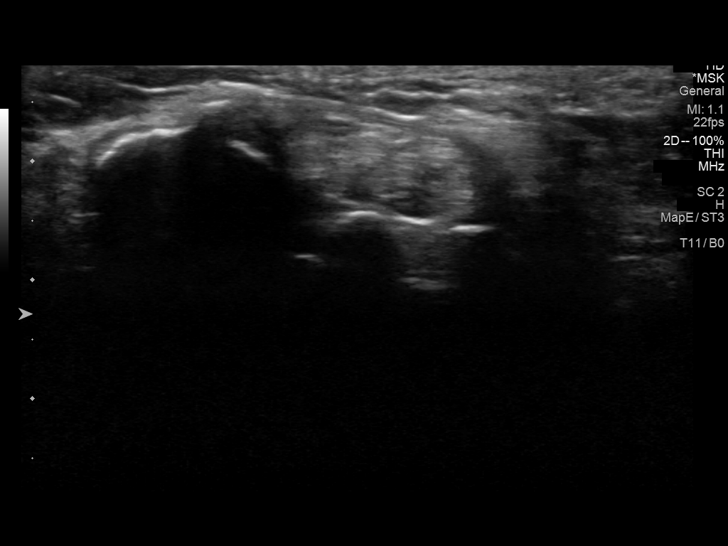
[im 23/28]
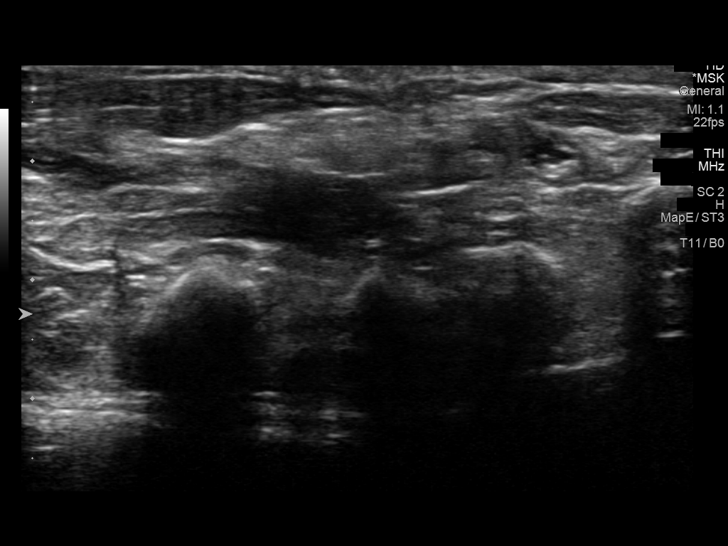
[im 25/28]
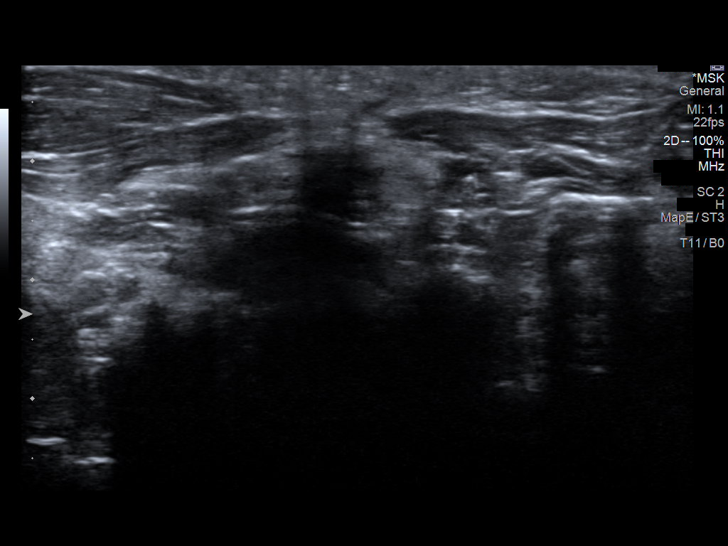
[im 28/28]
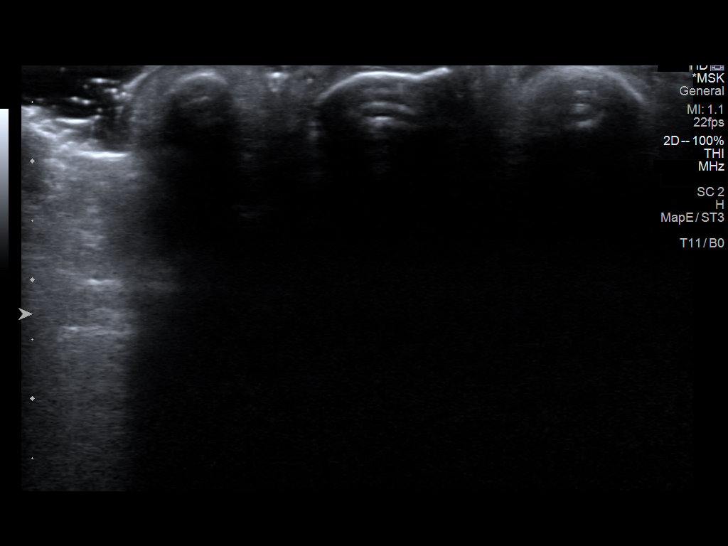

[14 of 25 positions shown; findings below may reference images not displayed]

FINDINGS: Joint Space: The MCP joints, IP joint and radiocarpal joint were
examined and demonstrate no effusions or other abnormalities.

Muscles: Normal.

Tendons: The flexor and extensor tendons were examined in the distal
forearm, wrist, hand, and fingers and all appear normal. There is no
fluid in the tendon sheaths to suggest tenosynovitis.

Other Soft Tissue Structures: There is no evidence of an abnormality
of the carpal tunnel. No ganglion cysts or masses. No abnormal blood
flow.
IMPRESSION: Negative ultrasound of the right hand and wrist.

## 2020-08-08 DIAGNOSIS — Z932 Ileostomy status: Secondary | ICD-10-CM | POA: Diagnosis not present

## 2020-08-08 DIAGNOSIS — F17210 Nicotine dependence, cigarettes, uncomplicated: Secondary | ICD-10-CM | POA: Diagnosis not present

## 2020-08-08 DIAGNOSIS — I5022 Chronic systolic (congestive) heart failure: Secondary | ICD-10-CM | POA: Diagnosis not present

## 2020-08-08 DIAGNOSIS — N179 Acute kidney failure, unspecified: Secondary | ICD-10-CM | POA: Diagnosis not present

## 2020-08-08 DIAGNOSIS — N2581 Secondary hyperparathyroidism of renal origin: Secondary | ICD-10-CM | POA: Diagnosis not present

## 2020-08-08 DIAGNOSIS — I130 Hypertensive heart and chronic kidney disease with heart failure and stage 1 through stage 4 chronic kidney disease, or unspecified chronic kidney disease: Secondary | ICD-10-CM | POA: Diagnosis not present

## 2020-08-08 DIAGNOSIS — E872 Acidosis: Secondary | ICD-10-CM | POA: Diagnosis not present

## 2020-08-08 DIAGNOSIS — N184 Chronic kidney disease, stage 4 (severe): Secondary | ICD-10-CM | POA: Diagnosis not present

## 2020-08-08 DIAGNOSIS — D631 Anemia in chronic kidney disease: Secondary | ICD-10-CM | POA: Diagnosis not present

## 2020-08-13 ENCOUNTER — Other Ambulatory Visit: Payer: Self-pay | Admitting: Family Medicine

## 2020-08-13 DIAGNOSIS — I2510 Atherosclerotic heart disease of native coronary artery without angina pectoris: Secondary | ICD-10-CM

## 2020-09-23 DIAGNOSIS — K5090 Crohn's disease, unspecified, without complications: Secondary | ICD-10-CM | POA: Diagnosis not present

## 2020-09-23 DIAGNOSIS — Z933 Colostomy status: Secondary | ICD-10-CM | POA: Diagnosis not present

## 2020-10-05 ENCOUNTER — Other Ambulatory Visit: Payer: Self-pay | Admitting: Family Medicine

## 2020-10-05 DIAGNOSIS — F419 Anxiety disorder, unspecified: Secondary | ICD-10-CM

## 2020-10-08 DIAGNOSIS — Z933 Colostomy status: Secondary | ICD-10-CM | POA: Diagnosis not present

## 2020-10-08 DIAGNOSIS — E872 Acidosis: Secondary | ICD-10-CM | POA: Diagnosis not present

## 2020-10-08 DIAGNOSIS — N179 Acute kidney failure, unspecified: Secondary | ICD-10-CM | POA: Diagnosis not present

## 2020-10-08 DIAGNOSIS — F17210 Nicotine dependence, cigarettes, uncomplicated: Secondary | ICD-10-CM | POA: Diagnosis not present

## 2020-10-08 DIAGNOSIS — I5022 Chronic systolic (congestive) heart failure: Secondary | ICD-10-CM | POA: Diagnosis not present

## 2020-10-08 DIAGNOSIS — R197 Diarrhea, unspecified: Secondary | ICD-10-CM | POA: Diagnosis not present

## 2020-10-08 DIAGNOSIS — N184 Chronic kidney disease, stage 4 (severe): Secondary | ICD-10-CM | POA: Diagnosis not present

## 2020-10-08 DIAGNOSIS — N2581 Secondary hyperparathyroidism of renal origin: Secondary | ICD-10-CM | POA: Diagnosis not present

## 2020-10-08 DIAGNOSIS — D631 Anemia in chronic kidney disease: Secondary | ICD-10-CM | POA: Diagnosis not present

## 2020-10-08 DIAGNOSIS — K5010 Crohn's disease of large intestine without complications: Secondary | ICD-10-CM | POA: Diagnosis not present

## 2020-10-08 DIAGNOSIS — I130 Hypertensive heart and chronic kidney disease with heart failure and stage 1 through stage 4 chronic kidney disease, or unspecified chronic kidney disease: Secondary | ICD-10-CM | POA: Diagnosis not present

## 2020-10-18 DIAGNOSIS — Z933 Colostomy status: Secondary | ICD-10-CM | POA: Diagnosis not present

## 2020-10-18 DIAGNOSIS — K5090 Crohn's disease, unspecified, without complications: Secondary | ICD-10-CM | POA: Diagnosis not present

## 2020-10-28 DIAGNOSIS — Z933 Colostomy status: Secondary | ICD-10-CM | POA: Diagnosis not present

## 2020-10-28 DIAGNOSIS — K5090 Crohn's disease, unspecified, without complications: Secondary | ICD-10-CM | POA: Diagnosis not present

## 2020-10-29 DIAGNOSIS — K5090 Crohn's disease, unspecified, without complications: Secondary | ICD-10-CM | POA: Diagnosis not present

## 2020-10-29 DIAGNOSIS — Z933 Colostomy status: Secondary | ICD-10-CM | POA: Diagnosis not present

## 2020-11-01 ENCOUNTER — Other Ambulatory Visit: Payer: Self-pay | Admitting: Family Medicine

## 2020-12-10 DIAGNOSIS — N184 Chronic kidney disease, stage 4 (severe): Secondary | ICD-10-CM | POA: Diagnosis not present

## 2020-12-10 DIAGNOSIS — N2581 Secondary hyperparathyroidism of renal origin: Secondary | ICD-10-CM | POA: Diagnosis not present

## 2020-12-10 DIAGNOSIS — I130 Hypertensive heart and chronic kidney disease with heart failure and stage 1 through stage 4 chronic kidney disease, or unspecified chronic kidney disease: Secondary | ICD-10-CM | POA: Diagnosis not present

## 2020-12-10 DIAGNOSIS — I5022 Chronic systolic (congestive) heart failure: Secondary | ICD-10-CM | POA: Diagnosis not present

## 2020-12-10 DIAGNOSIS — I701 Atherosclerosis of renal artery: Secondary | ICD-10-CM | POA: Diagnosis not present

## 2020-12-10 DIAGNOSIS — E559 Vitamin D deficiency, unspecified: Secondary | ICD-10-CM | POA: Diagnosis not present

## 2020-12-10 DIAGNOSIS — D631 Anemia in chronic kidney disease: Secondary | ICD-10-CM | POA: Diagnosis not present

## 2020-12-10 DIAGNOSIS — E872 Acidosis: Secondary | ICD-10-CM | POA: Diagnosis not present

## 2020-12-10 DIAGNOSIS — N179 Acute kidney failure, unspecified: Secondary | ICD-10-CM | POA: Diagnosis not present

## 2020-12-10 DIAGNOSIS — F17210 Nicotine dependence, cigarettes, uncomplicated: Secondary | ICD-10-CM | POA: Diagnosis not present

## 2020-12-17 ENCOUNTER — Other Ambulatory Visit: Payer: Self-pay | Admitting: Family Medicine

## 2020-12-17 DIAGNOSIS — R202 Paresthesia of skin: Secondary | ICD-10-CM

## 2021-01-28 DIAGNOSIS — K5090 Crohn's disease, unspecified, without complications: Secondary | ICD-10-CM | POA: Diagnosis not present

## 2021-01-28 DIAGNOSIS — Z933 Colostomy status: Secondary | ICD-10-CM | POA: Diagnosis not present

## 2021-01-29 ENCOUNTER — Other Ambulatory Visit: Payer: Self-pay | Admitting: Family Medicine

## 2021-01-29 DIAGNOSIS — I2510 Atherosclerotic heart disease of native coronary artery without angina pectoris: Secondary | ICD-10-CM

## 2021-02-14 ENCOUNTER — Telehealth: Payer: Self-pay

## 2021-02-14 NOTE — Telephone Encounter (Signed)
LVM to have pt call back to schedule AWV.   RE: confirm insurance and schedule AWV on my schedule if times are convenient for patient or other AWV schedule as template permits.   

## 2021-02-28 ENCOUNTER — Other Ambulatory Visit: Payer: Self-pay | Admitting: Family Medicine

## 2021-02-28 ENCOUNTER — Other Ambulatory Visit: Payer: Self-pay | Admitting: Cardiovascular Disease

## 2021-02-28 DIAGNOSIS — I2510 Atherosclerotic heart disease of native coronary artery without angina pectoris: Secondary | ICD-10-CM

## 2021-03-13 ENCOUNTER — Other Ambulatory Visit: Payer: Self-pay | Admitting: Cardiovascular Disease

## 2021-03-28 ENCOUNTER — Other Ambulatory Visit: Payer: Self-pay | Admitting: Family Medicine

## 2021-03-28 DIAGNOSIS — R200 Anesthesia of skin: Secondary | ICD-10-CM

## 2021-04-23 DIAGNOSIS — E872 Acidosis, unspecified: Secondary | ICD-10-CM | POA: Diagnosis not present

## 2021-04-23 DIAGNOSIS — D631 Anemia in chronic kidney disease: Secondary | ICD-10-CM | POA: Diagnosis not present

## 2021-04-23 DIAGNOSIS — I132 Hypertensive heart and chronic kidney disease with heart failure and with stage 5 chronic kidney disease, or end stage renal disease: Secondary | ICD-10-CM | POA: Diagnosis not present

## 2021-04-23 DIAGNOSIS — I5022 Chronic systolic (congestive) heart failure: Secondary | ICD-10-CM | POA: Diagnosis not present

## 2021-04-23 DIAGNOSIS — N2581 Secondary hyperparathyroidism of renal origin: Secondary | ICD-10-CM | POA: Diagnosis not present

## 2021-04-23 DIAGNOSIS — N185 Chronic kidney disease, stage 5: Secondary | ICD-10-CM | POA: Diagnosis not present

## 2021-04-23 DIAGNOSIS — F17210 Nicotine dependence, cigarettes, uncomplicated: Secondary | ICD-10-CM | POA: Diagnosis not present

## 2021-04-23 DIAGNOSIS — N184 Chronic kidney disease, stage 4 (severe): Secondary | ICD-10-CM | POA: Diagnosis not present

## 2021-04-24 ENCOUNTER — Inpatient Hospital Stay (HOSPITAL_COMMUNITY)
Admission: EM | Admit: 2021-04-24 | Discharge: 2021-04-29 | DRG: 674 | Disposition: A | Discharge status: Home / Self Care | Payer: Medicare Other | Attending: Family Medicine | Admitting: Family Medicine

## 2021-04-24 ENCOUNTER — Other Ambulatory Visit: Payer: Self-pay

## 2021-04-24 ENCOUNTER — Encounter (HOSPITAL_COMMUNITY): Payer: Self-pay | Admitting: Emergency Medicine

## 2021-04-24 ENCOUNTER — Emergency Department (HOSPITAL_COMMUNITY): Payer: Medicare Other

## 2021-04-24 DIAGNOSIS — R0600 Dyspnea, unspecified: Secondary | ICD-10-CM | POA: Diagnosis not present

## 2021-04-24 DIAGNOSIS — Z992 Dependence on renal dialysis: Secondary | ICD-10-CM | POA: Diagnosis not present

## 2021-04-24 DIAGNOSIS — I10 Essential (primary) hypertension: Secondary | ICD-10-CM | POA: Diagnosis not present

## 2021-04-24 DIAGNOSIS — E860 Dehydration: Secondary | ICD-10-CM | POA: Diagnosis present

## 2021-04-24 DIAGNOSIS — I132 Hypertensive heart and chronic kidney disease with heart failure and with stage 5 chronic kidney disease, or end stage renal disease: Secondary | ICD-10-CM | POA: Diagnosis not present

## 2021-04-24 DIAGNOSIS — Z8249 Family history of ischemic heart disease and other diseases of the circulatory system: Secondary | ICD-10-CM | POA: Diagnosis not present

## 2021-04-24 DIAGNOSIS — F17210 Nicotine dependence, cigarettes, uncomplicated: Secondary | ICD-10-CM | POA: Diagnosis not present

## 2021-04-24 DIAGNOSIS — Z932 Ileostomy status: Secondary | ICD-10-CM

## 2021-04-24 DIAGNOSIS — N2581 Secondary hyperparathyroidism of renal origin: Secondary | ICD-10-CM | POA: Diagnosis present

## 2021-04-24 DIAGNOSIS — E7800 Pure hypercholesterolemia, unspecified: Secondary | ICD-10-CM | POA: Diagnosis not present

## 2021-04-24 DIAGNOSIS — J449 Chronic obstructive pulmonary disease, unspecified: Secondary | ICD-10-CM | POA: Diagnosis not present

## 2021-04-24 DIAGNOSIS — E875 Hyperkalemia: Secondary | ICD-10-CM | POA: Diagnosis present

## 2021-04-24 DIAGNOSIS — Z7902 Long term (current) use of antithrombotics/antiplatelets: Secondary | ICD-10-CM | POA: Diagnosis not present

## 2021-04-24 DIAGNOSIS — N179 Acute kidney failure, unspecified: Secondary | ICD-10-CM | POA: Diagnosis not present

## 2021-04-24 DIAGNOSIS — Z933 Colostomy status: Secondary | ICD-10-CM | POA: Diagnosis not present

## 2021-04-24 DIAGNOSIS — F410 Panic disorder [episodic paroxysmal anxiety] without agoraphobia: Secondary | ICD-10-CM | POA: Diagnosis present

## 2021-04-24 DIAGNOSIS — Z20822 Contact with and (suspected) exposure to covid-19: Secondary | ICD-10-CM | POA: Diagnosis present

## 2021-04-24 DIAGNOSIS — N186 End stage renal disease: Secondary | ICD-10-CM | POA: Diagnosis not present

## 2021-04-24 DIAGNOSIS — D631 Anemia in chronic kidney disease: Secondary | ICD-10-CM | POA: Diagnosis present

## 2021-04-24 DIAGNOSIS — F419 Anxiety disorder, unspecified: Secondary | ICD-10-CM

## 2021-04-24 DIAGNOSIS — E559 Vitamin D deficiency, unspecified: Secondary | ICD-10-CM | POA: Diagnosis not present

## 2021-04-24 DIAGNOSIS — K219 Gastro-esophageal reflux disease without esophagitis: Secondary | ICD-10-CM | POA: Diagnosis present

## 2021-04-24 DIAGNOSIS — Z9049 Acquired absence of other specified parts of digestive tract: Secondary | ICD-10-CM

## 2021-04-24 DIAGNOSIS — F32A Depression, unspecified: Secondary | ICD-10-CM | POA: Diagnosis not present

## 2021-04-24 DIAGNOSIS — G629 Polyneuropathy, unspecified: Secondary | ICD-10-CM | POA: Diagnosis present

## 2021-04-24 DIAGNOSIS — E8722 Chronic metabolic acidosis: Secondary | ICD-10-CM | POA: Diagnosis present

## 2021-04-24 DIAGNOSIS — R200 Anesthesia of skin: Secondary | ICD-10-CM

## 2021-04-24 DIAGNOSIS — N189 Chronic kidney disease, unspecified: Secondary | ICD-10-CM | POA: Diagnosis present

## 2021-04-24 DIAGNOSIS — N185 Chronic kidney disease, stage 5: Secondary | ICD-10-CM

## 2021-04-24 DIAGNOSIS — D6959 Other secondary thrombocytopenia: Secondary | ICD-10-CM | POA: Diagnosis not present

## 2021-04-24 DIAGNOSIS — Z79899 Other long term (current) drug therapy: Secondary | ICD-10-CM

## 2021-04-24 DIAGNOSIS — I2510 Atherosclerotic heart disease of native coronary artery without angina pectoris: Secondary | ICD-10-CM

## 2021-04-24 DIAGNOSIS — I5022 Chronic systolic (congestive) heart failure: Secondary | ICD-10-CM | POA: Diagnosis not present

## 2021-04-24 DIAGNOSIS — N19 Unspecified kidney failure: Secondary | ICD-10-CM

## 2021-04-24 DIAGNOSIS — F411 Generalized anxiety disorder: Secondary | ICD-10-CM

## 2021-04-24 LAB — BRAIN NATRIURETIC PEPTIDE: B Natriuretic Peptide: 7.9 pg/mL (ref 0.0–100.0)

## 2021-04-24 LAB — URINALYSIS, ROUTINE W REFLEX MICROSCOPIC
Bilirubin Urine: NEGATIVE
Glucose, UA: NEGATIVE mg/dL
Hgb urine dipstick: NEGATIVE
Ketones, ur: NEGATIVE mg/dL
Nitrite: NEGATIVE
Protein, ur: NEGATIVE mg/dL
Specific Gravity, Urine: 1.006 (ref 1.005–1.030)
pH: 5 (ref 5.0–8.0)

## 2021-04-24 LAB — CBC WITH DIFFERENTIAL/PLATELET
Abs Immature Granulocytes: 0.01 10*3/uL (ref 0.00–0.07)
Basophils Absolute: 0 10*3/uL (ref 0.0–0.1)
Basophils Relative: 0 %
Eosinophils Absolute: 0.1 10*3/uL (ref 0.0–0.5)
Eosinophils Relative: 3 %
HCT: 26.1 % — ABNORMAL LOW (ref 36.0–46.0)
Hemoglobin: 8.3 g/dL — ABNORMAL LOW (ref 12.0–15.0)
Immature Granulocytes: 0 %
Lymphocytes Relative: 41 %
Lymphs Abs: 2 10*3/uL (ref 0.7–4.0)
MCH: 31.7 pg (ref 26.0–34.0)
MCHC: 31.8 g/dL (ref 30.0–36.0)
MCV: 99.6 fL (ref 80.0–100.0)
Monocytes Absolute: 0.5 10*3/uL (ref 0.1–1.0)
Monocytes Relative: 11 %
Neutro Abs: 2.2 10*3/uL (ref 1.7–7.7)
Neutrophils Relative %: 45 %
Platelets: 152 10*3/uL (ref 150–400)
RBC: 2.62 MIL/uL — ABNORMAL LOW (ref 3.87–5.11)
RDW: 13.9 % (ref 11.5–15.5)
WBC: 4.8 10*3/uL (ref 4.0–10.5)
nRBC: 0.6 % — ABNORMAL HIGH (ref 0.0–0.2)

## 2021-04-24 LAB — COMPREHENSIVE METABOLIC PANEL WITH GFR
ALT: 21 U/L (ref 0–44)
AST: 20 U/L (ref 15–41)
Albumin: 3.7 g/dL (ref 3.5–5.0)
Alkaline Phosphatase: 92 U/L (ref 38–126)
Anion gap: 11 (ref 5–15)
BUN: 84 mg/dL — ABNORMAL HIGH (ref 8–23)
CO2: 12 mmol/L — ABNORMAL LOW (ref 22–32)
Calcium: 9 mg/dL (ref 8.9–10.3)
Chloride: 110 mmol/L (ref 98–111)
Creatinine, Ser: 5.46 mg/dL — ABNORMAL HIGH (ref 0.44–1.00)
GFR, Estimated: 8 mL/min — ABNORMAL LOW (ref >60)
Glucose, Bld: 112 mg/dL — ABNORMAL HIGH (ref 70–99)
Potassium: 5.1 mmol/L (ref 3.5–5.1)
Sodium: 133 mmol/L — ABNORMAL LOW (ref 135–145)
Total Bilirubin: 0.8 mg/dL (ref 0.3–1.2)
Total Protein: 7.6 g/dL (ref 6.5–8.1)

## 2021-04-24 MED ORDER — SODIUM CHLORIDE 0.9 % IV BOLUS
500.0000 mL | Freq: Once | INTRAVENOUS | Status: AC
  Administered 2021-04-25: 500 mL via INTRAVENOUS

## 2021-04-24 NOTE — H&P (Addendum)
[facility] Admission History and Physical Service Pager: [phone]  Patient name: [name] [name] Medical record number: [id number] Date of birth: 1950/08/28 Age: 70 y.o. Gender: female  Primary Care Provider: Zenia Resides, MD Consultants: nephrology  Code Status: Full   Preferred Emergency Contact: Gardiner Rhyme (Daughter), [phone]   Chief Complaint: abnormal labs   Assessment and Plan: [name] [name] is a 70 y.o. female presenting with dehydration and abnormal lab . PMH is significant for CKD stage V, anemia of chronic disease, COPD, CAD, Anxiety, HLD, HTN and Tobacco use    Acute on Chronic Kidney failure  CKD Stage V  Metabolic acidosis  Patient was seen recently by her nephrologist who informed her of worsening kidney function. The nephrologist was concerned patient could be dehydrated and  advised her to come to the ED due to elevated Creatinine of  5.06 (baseline is 2.5-3) and GFR was 9.  Pt received 500 cc bolus in the ED. On admission, she has acute worsening CKD with her creatinine 5.46, BUN 84 and GFR 8. She was found to be hyperkalemic with K 5.1. Metabolic acidosis with pH of 7.25, hypocapnic with bicarbonate of 10.6 and PO2 of 103.0. Additional lab work returned with  K 5.6. Repeat EKG was ordered and she got one time dose of Lokelma 10g.  Per chart review her out patient nephrologist suggested patient might need to go on HD or PD should her GFR remain or get less than 15. Patient reports that she normal drinks a lot of water and her urine color is usually yellow. She denies any issue with voiding, urinary frequency, or hematuria.  Patient's overall picture seems to have a worsening condition of her kidney failure. sodium bicarbonate 1300 mg twice daily. She was given 1300 mg sodium bicarb overnight. Will consult nephrology for further management recommendations. -Admit to FPTS, Attending Dr. Andria Frames -Avoid nephrotoxic agents - No blood draws in  the left arm.  -Follow up 12 lead EKG -Daily Lab: RFP, CBC - Follow up renal US  - Obtain urine sediment  - Follow up urine studies: Pr/Cr ratio, Osm, Na - Cardiorespiratory Monitoring - Heparin for VTE prophylaxis - 75 mL/hr NS IV fluid --Renal diet with boost supplement QID  Crohn's disease s/p colectomy & ileostomy Patient with long standing history of Crohns had her ileostomy in 2002 and Colostomy in 1997. On exam patient has a soft abdomen with colostomy bag placed to the left of the abdomen. The Colostomy bag has small drainage, patient reports she changes the bag twice a day and its due for a change. She is on home medication of  Mesalamine 800 mg 3 times daily, Kenalog 0.1% topical lotion -Change of colostomy bag BID -Boost supplements QID -Continue home Mesalamine  Chronic systolic CHF  HFrEF 38-33%  Last Echo (08/17/19) showed EF of 20-25%  Her vitals were within normal limits, BMP was normal at 7.9. CXR showed no cardiopulmonary abnormalities. On exam patient has bibasilar crackles with no JVD or LE edema. Patient denies any dyspnea or chest pain. She is managed outpatient by Dr. Eleonore Chiquito.  On home medication of Coreg 6.25 mg twice daily and possibly Imdur.  -Daily Lab: BNP, CBC -Cardiorespiratory Monitoring - restart home medication once med-rec completed      Hypertension Chronic and stable. Her BP on admission was 113/75. She is on home medication of  Hydralazine 25 mg 3 times daily, Coreg 6.25 mg twice daily and BiDil 20-37.5 mg TID -Continue daily vitals -  We will assess need for home medication once med rec completed.  Hyperlipidemia  Stable and chronic. On home medication of Lipitor 40 mg daily -Restart home medication once med rec completed  CAD Chronic and stable. Patient takes home medication of Lipitor 39m daily and Plavix 741mdaily. -Restart home medication once med rec is complete  Anemia of chronic diease  Patient on admission was found to have  normocytic anemia with hemoglobin of 7.9 and MCV of 98. Most likely attributed to her long standing history of CKD. She takes home medication of Ferrous sulfate 86 mg daily. Pt may need EPO medication.  -Continue daily CBC -Continue home medication of ferrous sulfate 86 mg daily -Transfusion threshold <8   Peripheral Neuropathy vs Radiculopathy  Patient reports having history of tingling and numbness on her upper and lower extremities.  On exam patient had paresthesia of the right foot up to mid shin.  Sensation on the left lower extremities is intact. She takes home medication of Lyrica 25 mg BID -Restart home medication once med rec completed  Severe Anxiety and depression Chronic: On Exam patient had a pleasant affect and mood. Patient report smoking cigarettes occasionally as a coping mechanism.  Awake, she takes home medication of mirtazapine 15 mg at bedtime and Ativan 0.5 mg BID PRN. -Continue home medication once rec completed.   COPD  Tobacco Use Disorder: -Patient smoking 1 pack per 2-3 weeks  for the past 11 years.  Says she smokes only when she has anxiety and is more likely coping mechanism.  Offered nicotine patch however patient declined. Does not use any inhalers for her COPD.  - Nicotine patch available upon request -Educated patient on smoking cessation   FEN/GI: Renal diet with Boost supplement QID  Prophylaxis: Heparin  Disposition: Progressive  History of Present Illness:  [name] Altamiranos a [age].o. female presenting for abnormal lab. States her kidney doctor called her on Wednesday and told her she may be dehydrated and needed to go to the hospital. Drinks a lot of water and occasional light colored soda.     Review Of Systems: Per HPI with the following additions:   Review of Systems  Constitutional:  Positive for fever.  HENT:  Negative for sore throat.   Eyes:        Blurry vision  Respiratory:  Negative for cough and shortness of breath.   Cardiovascular:   Negative for chest pain.  Genitourinary:  Positive for decreased urine volume. Negative for difficulty urinating, dysuria, frequency and urgency.  Musculoskeletal:  Positive for back pain.  Skin:  Negative for rash.  Neurological:  Negative for headaches.    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   Right knee pain 11/02/2018   Eczema, dyshidrotic 05/04/2014   Hypomagnesemia 07/13/2013   Unspecified vitamin D deficiency 05/20/2013   Hypotension 02/08/2013   CAD (coronary artery disease) 05/30/2012   Tobacco abuse 05/30/2012   Anemia due to chronic kidney disease 05/30/2012   Osteopenia 10/05/2011   Atypical squamous cells of undetermined significance on cytologic smear of cervix ([facility]-US) 09/21/2011   Left renal artery stenosis ([facility]CM[facility]03/07/2011   CKD (chronic kidney disease) stage 4, GFR 15-29 ml/min (HCS[facility]01/24/2013   History of seizure 03/13/2011   Numbness and tingling in right hand 11/18/2007   HYPERCHOLESTEROLEMIA 01/10/2007   Anxiety and depression 09/02/2006   NEUROPATHY,  PERIPHERAL 09/02/2006   Reflux esophagitis 09/02/2006   Regional enteritis ([place]) 09/02/2006    Past Medical History: Past Medical History:  Diagnosis Date   Abnormal chest CT    Coronary atherosclerosis on chest CT 2012   Anemia, chronic renal failure    Anxiety    Asthma 05/2011   CAD (coronary artery disease)    Carpal tunnel syndrome    CHF (congestive heart failure) (HCC)    EF 30-35% 2012->EF 60-65% 2013   CKD (chronic kidney disease), stage III (HCC)    Crohn's disease ([place])    GERD (gastroesophageal reflux disease)    Headache(784.0)    "related to high BP" (05/30/2012)   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 Clent Jacks   CATARACT EXTRACTION W/ INTRAOCULAR LENS  IMPLANT, BILATERAL  ~ 2000   CHOLECYSTECTOMY  01/28/2005   COLOSTOMY  03/1996   diverting   ECTOPIC PREGNANCY SURGERY  ?1980's   left   EYE SURGERY Bilateral    Cat Sx - Dr. Clent Jacks   ILEOSTOMY  ?  2002   NECK SURGERY  2020   "pinched nerve"    Social History: Social History   Tobacco Use   Smoking status: Every Day    Packs/day: 0.12    Years: 30.00    Pack years: 3.60    Types: Cigarettes   Smokeless tobacco: Never  Substance Use Topics   Alcohol use: No    Comment: 05/30/2012 "used to drink back in the day; last alcohol 23 yr ago"   Drug use: No   Additional social history:   Please also refer to relevant sections of EMR.  Family History: Family History  Problem Relation Age of Onset   Heart disease Mother    Other Mother        Covid   Glaucoma Mother    Stroke Father    Other Sister        AIDS     Allergies and Medications: Allergies  Allergen Reactions   Amoxicillin Anaphylaxis, Rash and Other (See Comments)    Throat Swelling   Aspirin Anaphylaxis, Itching and Rash   Morphine And Related Anaphylaxis   Penicillins Anaphylaxis and Rash   Gabapentin Other (See Comments)    Patient had one time seizure shortly after stopping gabapentin   Ciprofloxacin Swelling    Possible reaction to cipro or clindamycin 04/29/14   Ace Inhibitors Other (See Comments) and Cough    ACE possibly associated with cough and switched to ARB.  Would be OK to re-challenge if needed.   No current facility-administered medications on file prior to encounter.   Current Outpatient Medications on File Prior to Encounter  Medication Sig Dispense Refill   ascorbic acid (VITAMIN C) 500 MG tablet Take by mouth.     atorvastatin (LIPITOR) 40 MG tablet  Take 1 tablet (40 mg total) by mouth daily. Stop taking simvastatin. 90 tablet 3   BIDIL 20-37.5 MG tablet Take 1 tablet by mouth 3 (three) times daily.     calcium citrate (CALCITRATE - DOSED IN MG ELEMENTAL  CALCIUM) 950 (200 Ca) MG tablet Take by mouth daily.     carvedilol (COREG) 6.25 MG tablet Take 1 tablet (6.25 mg total) by mouth 2 (two) times daily with a meal. 180 tablet 0   cholecalciferol (VITAMIN D) 1000 units tablet Take 1,000 Units by mouth daily.     clopidogrel (PLAVIX) 75 MG tablet Take 1 tablet (75 mg total) by mouth daily. 90 tablet 3   Ferrous Fumarate 86 (27 Fe) MG CAPS Take by mouth daily.     hydrALAZINE (APRESOLINE) 25 MG tablet Take 1 tablet (25 mg total) by mouth 3 (three) times daily. 90 tablet 1   isosorbide mononitrate (IMDUR) 30 MG 24 hr tablet Take 1 tablet (30 mg total) by mouth daily. 90 tablet 3   lidocaine (XYLOCAINE) 2 % solution Use as directed 20 mLs in the mouth or throat as needed for mouth pain. 100 mL 0   LORazepam (ATIVAN) 0.5 MG tablet TAKE ONE TABLET BY MOUTH TWICE DAILY AS NEEDED FOR ANXIETY 60 tablet 5   Mesalamine 800 MG TBEC TAKE ONE TABLET BY MOUTH THREE TIMES DAILY 270 tablet 3   mirtazapine (REMERON) 15 MG tablet Take 1 tablet (15 mg total) by mouth at bedtime. 30 tablet 0   Multiple Vitamin (MULTIVITAMIN) tablet Take 1 tablet by mouth 2 (two) times daily.     pregabalin (LYRICA) 25 MG capsule Take 1 capsule (25 mg total) by mouth 2 (two) times daily. 180 capsule 1   sodium bicarbonate 650 MG tablet Take 1,300 mg by mouth 2 (two) times daily.     triamcinolone ointment (KENALOG) 0.1 % Apply 1 application topically 2 (two) times daily. Apply to skin around stoma 80 g 6    Objective: BP 121/64   Pulse 77   Temp 98.5 F (36.9 C) (Oral)   Resp 16   SpO2 100%  Exam: General: Awake, well-appearing, NAD HEENT: Moist mucous membrane, no conjunctival injection Cardiovascular: RRR, no murmurs, normal S1/S2 Respiratory: Bibasilar crackles, no  wheezing, nonlabored breathing on RA Gastrointestinal: Soft, no distention, colostomy bag placed on the left abdomen with small drainage. MSK: Normal range of motion, no LE edema,  5/5 muscle strength on all extremities. Derm: Dry, warm  Neuro: Oriented x3, paresthesia of the right foot up to mid shin, normal sensation on RLE. Psych: anxious, normal affect   Labs and Imaging: CBC BMET  Recent Labs  Lab 04/24/21 1604  WBC 4.8  HGB 8.3*  HCT 26.1*  PLT 152   Recent Labs  Lab 04/24/21 1604  NA 133*  K 5.1  CL 110  CO2 12*  BUN 84*  CREATININE 5.46*  GLUCOSE 112*  CALCIUM 9.0     EKG: Normal sinus rhythm, no acute ST or T wave changes   DG Chest 2 View  Result Date: 04/24/2021 CLINICAL DATA:  Dyspnea on exertion. EXAM: CHEST - 2 VIEW COMPARISON:  October 09, 2015. FINDINGS: The heart size and mediastinal contours are within normal limits. Both lungs are clear. The visualized skeletal structures are unremarkable. IMPRESSION: No active cardiopulmonary disease. Electronically Signed   By: Marijo Conception [name]   On: 04/24/2021 17:21     Alen Bleacher, MD 04/24/2021, 10:14 PM PGY-1, [facility] Intern pager: [phone], text pages welcome   FPTS Upper-Level Resident Addendum   I have independently interviewed and examined the patient. I have discussed the above with the original author and agree with their documentation. My edits for correction/addition/clarification are in  within the document. Please see also any attending notes.   Lyndee Hensen, DO PGY-3, [facility] 04/25/2021 8:14 AM  FPTS Service pager: [phone] (text pages welcome through [facility])

## 2021-04-24 NOTE — ED Notes (Signed)
Pt ambulated to restroom with standby assist.

## 2021-04-24 NOTE — ED Provider Notes (Addendum)
[facility] EMERGENCY DEPARTMENT Provider Note   CSN: [id number] Arrival date & time: 04/24/21  1554     History Chief Complaint  Patient presents with   Abnormal Lab    [name] [name] is a 70 y.o. female.  HPI  70 year old female with past medical history of HTN, HLD, CHF, CAD, CKD presents to the emergency department at the request of her outpatient doctor for worsening lab results.  Patient states for the last week she has been very weak, fatigued.  Has had a decreased appetite and minimal p.o. intake over the last 2 days due to just "feeling lousy".  Patient denies any acute symptoms including chest pain, shortness of breath, fever, vomiting/diarrhea.  States that she is urinating at baseline.  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   Hyperlipidemia    Hypertension    Hypertensive retinopathy    OU   Osteopenia    Panic attacks    Reflux esophagitis    Seizure ([place])    hx of   Tobacco abuse    Vitamin D deficiency     Patient Active Problem List   Diagnosis Date Noted   HFrEF (heart failure with reduced ejection fraction) ([place]) 03/27/2020   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   Right knee pain 11/02/2018   Eczema, dyshidrotic 05/04/2014   Hypomagnesemia 07/13/2013   Unspecified vitamin D deficiency 05/20/2013   Hypotension 02/08/2013   CAD (coronary artery disease) 05/30/2012   Tobacco abuse 05/30/2012   Anemia due to chronic kidney disease 05/30/2012    Osteopenia 10/05/2011   Atypical squamous cells of undetermined significance on cytologic smear of cervix (ASC-US) 09/21/2011   Left renal artery stenosis ([place]) 09/04/2011   CKD (chronic kidney disease) stage 4, GFR 15-29 ml/min ([place]) 07/30/2011   History of seizure 03/13/2011   Numbness and tingling in right hand 11/18/2007   HYPERCHOLESTEROLEMIA 01/10/2007   Anxiety and depression 09/02/2006   NEUROPATHY, PERIPHERAL 09/02/2006   Reflux esophagitis 09/02/2006   Regional enteritis ([place]) 09/02/2006    Past Surgical History:  Procedure Laterality Date   CATARACT EXTRACTION Bilateral    Dr. Clent Jacks   CATARACT EXTRACTION W/ INTRAOCULAR LENS  IMPLANT, BILATERAL  ~ 2000   CHOLECYSTECTOMY  01/28/2005   COLOSTOMY  03/1996   diverting   ECTOPIC PREGNANCY SURGERY  ?1980's   left   EYE SURGERY Bilateral    Cat Sx - Dr. Clent Jacks   ILEOSTOMY  ?  2002   NECK SURGERY  2020   "pinched nerve"     OB History     Gravida  2   Para  1   Term  1   Preterm  0   AB  1   Living  1      SAB  0   IAB  0   Ectopic  1   Multiple  0   Live Births  Family History  Problem Relation Age of Onset   Heart disease Mother    Other Mother        Covid   Glaucoma Mother    Stroke Father    Other Sister        AIDS    Social History   Tobacco Use   Smoking status: Every Day    Packs/day: 0.12    Years: 30.00    Pack years: 3.60    Types: Cigarettes   Smokeless tobacco: Never  Substance Use Topics   Alcohol use: No    Comment: 05/30/2012 "used to drink back in the day; last alcohol 23 yr ago"   Drug use: No    Home Medications Prior to Admission medications   Medication Sig Start Date End Date Taking? Authorizing Provider  ascorbic acid (VITAMIN C) 500 MG tablet Take by mouth. 01/21/19   [provider]  atorvastatin (LIPITOR) 40 MG tablet Take 1 tablet (40 mg total) by mouth daily. Stop taking simvastatin. 01/30/21   Zenia Resides, MD   BIDIL 20-37.5 MG tablet Take 1 tablet by mouth 3 (three) times daily. 05/09/20   [provider]  calcium citrate (CALCITRATE - DOSED IN MG ELEMENTAL CALCIUM) 950 (200 Ca) MG tablet Take by mouth daily. 01/21/19   [provider]  carvedilol (COREG) 6.25 MG tablet Take 1 tablet (6.25 mg total) by mouth 2 (two) times daily with a meal. 02/28/21   McDiarmid, Blane Ohara, MD  cholecalciferol (VITAMIN D) 1000 units tablet Take 1,000 Units by mouth daily.    [provider]  clopidogrel (PLAVIX) 75 MG tablet Take 1 tablet (75 mg total) by mouth daily. 08/13/20   Zenia Resides, MD  Ferrous Fumarate 86 (27 Fe) MG CAPS Take by mouth daily.    [provider]  hydrALAZINE (APRESOLINE) 25 MG tablet Take 1 tablet (25 mg total) by mouth 3 (three) times daily. 02/28/21 05/29/21  Geralynn Rile, MD  isosorbide mononitrate (IMDUR) 30 MG 24 hr tablet Take 1 tablet (30 mg total) by mouth daily. 06/19/20 09/17/20  O'NealCassie Freer, MD  lidocaine (XYLOCAINE) 2 % solution Use as directed 20 mLs in the mouth or throat as needed for mouth pain. 03/04/20   Vol[place] American, PA-C  LORazepam (ATIVAN) 0.5 MG tablet TAKE ONE TABLET BY MOUTH TWICE DAILY AS NEEDED FOR ANXIETY 11/04/20   Zenia Resides, MD  Mesalamine 800 MG TBEC TAKE ONE TABLET BY MOUTH THREE TIMES DAILY 02/08/20   Zenia Resides, MD  mirtazapine (REMERON) 15 MG tablet Take 1 tablet (15 mg total) by mouth at bedtime. 10/07/20   McDiarmid, Blane Ohara, MD  Multiple Vitamin (MULTIVITAMIN) tablet Take 1 tablet by mouth 2 (two) times daily.    [provider]  pregabalin (LYRICA) 25 MG capsule Take 1 capsule (25 mg total) by mouth 2 (two) times daily. 03/31/21   Zenia Resides, MD  sodium bicarbonate 650 MG tablet Take 1,300 mg by mouth 2 (two) times daily.    [provider]  triamcinolone ointment (KENALOG) 0.1 % Apply 1 application topically 2 (two) times daily. Apply to skin around stoma 03/14/20    McDiarmid, Blane Ohara, MD    Allergies    Amoxicillin, Aspirin, Morphine and related, Penicillins, Gabapentin, Ciprofloxacin, and Ace inhibitors  Review of Systems   Review of Systems  Constitutional:  Positive for appetite change and fatigue. Negative for chills and fever.  HENT:  Negative for congestion.  Eyes:  Negative for visual disturbance.  Respiratory:  Negative for shortness of breath.   Cardiovascular:  Negative for chest pain.  Gastrointestinal:  Negative for abdominal pain, diarrhea and vomiting.  Genitourinary:  Negative for difficulty urinating and dysuria.  Skin:  Negative for rash.  Neurological:  Negative for headaches.   Physical Exam Updated Vital Signs BP 113/75   Pulse 77   Temp 98.5 F (36.9 C) (Oral)   Resp 15   SpO2 100%   Physical Exam Vitals and nursing note reviewed.  Constitutional:      General: She is not in acute distress.    Appearance: Normal appearance.  HENT:     Head: Normocephalic.     Mouth/Throat:     Mouth: Mucous membranes are moist.  Cardiovascular:     Rate and Rhythm: Normal rate.  Pulmonary:     Effort: Pulmonary effort is normal. No respiratory distress.  Abdominal:     Palpations: Abdomen is soft.     Tenderness: There is no abdominal tenderness.  Skin:    General: Skin is warm.  Neurological:     Mental Status: She is alert and oriented to person, place, and time. Mental status is at baseline.  Psychiatric:        Mood and Affect: Mood normal.    ED Results / Procedures / Treatments   Labs (all labs ordered are listed, but only abnormal results are displayed) Labs Reviewed  CBC WITH DIFFERENTIAL/PLATELET - Abnormal; Notable for the following components:      Result Value   RBC 2.62 (*)    Hemoglobin 8.3 (*)    HCT 26.1 (*)    nRBC 0.6 (*)    All other components within normal limits  COMPREHENSIVE METABOLIC PANEL - Abnormal; Notable for the following components:   Sodium 133 (*)    CO2 12 (*)    Glucose, Bld  112 (*)    BUN 84 (*)    Creatinine, Ser 5.46 (*)    GFR, Estimated 8 (*)    All other components within normal limits  RESP PANEL BY RT-PCR (FLU A&B, COVID) ARPGX2  BRAIN NATRIURETIC PEPTIDE  URINALYSIS, ROUTINE W REFLEX MICROSCOPIC  I-STAT VENOUS BLOOD GAS, ED    EKG EKG Interpretation  Date/Time:  Thursday April 24 2021 15:55:38 EDT Ventricular Rate:  89 PR Interval:  138 QRS Duration: 102 QT Interval:  386 QTC Calculation: 469 R Axis:   48 Text Interpretation: Normal sinus rhythm Cannot rule out Anterior infarct , age undetermined ST & T wave abnormality, consider lateral ischemia Abnormal ECG Confirmed by Fredia Sorrow [phone]) on 04/24/2021 5:37:38 PM  Radiology DG Chest 2 View  Result Date: 04/24/2021 CLINICAL DATA:  Dyspnea on exertion. EXAM: CHEST - 2 VIEW COMPARISON:  October 09, 2015. FINDINGS: The heart size and mediastinal contours are within normal limits. Both lungs are clear. The visualized skeletal structures are unremarkable. IMPRESSION: No active cardiopulmonary disease. Electronically Signed   By: Marijo Conception [name]   On: 04/24/2021 17:21    Procedures Procedures   Medications Ordered in ED Medications  sodium chloride 0.9 % bolus 500 mL (has no administration in time range)    ED Course  I have reviewed the triage vital signs and the nursing notes.  Pertinent labs & imaging results that were available during my care of the patient were reviewed by me and considered in my medical decision making (see chart for details).    MDM Rules/Calculators/A&P  70 year old female presents emergency department the request of her outpatient doctor for worsening lab work.  Patient endorses a week of fatigue and decreased appetite with decreased p.o. intake.  Blood work here shows worsening kidney dysfunction, creatinine is typically 2-3 and today it is over 5.  No acute electrolyte abnormalities.  VBG is still pending.  Urinalysis has  been ordered.  Patient states that she has been urinating without difficulty.  It had originally been reported to me that the patient received IV fluids prior to arrival.  No further IV fluids were given in the emergency department for concern of CHF history.  However family confirmed to staff that the patient did not receive any IV fluids prior to arrival, small bolus ordered here in the emergency department.  Patient is pending urinalysis.  Otherwise stable vitals.  Does not appear to meet criteria for emergent dialysis.  Patients evaluation and results requires admission for further treatment and care. Patient agrees with admission plan, offers no new complaints and is stable/unchanged at time of admit.  I attempted to reach the patient's daughter Helene Kelp at the listed number but was unsuccessful.  Final Clinical Impression(s) / ED Diagnoses Final diagnoses:  None    Rx / DC Orders ED Discharge Orders     None        Lorelle Gibbs, DO 04/24/21 2214    Lorelle Gibbs, DO 04/24/21 2220

## 2021-04-24 NOTE — ED Triage Notes (Signed)
Pt sent by her kidney doctor for further evaluation of dehydration and abnormal labs. Denies nausea/vomiting/diarrhea. C/o shortness of breath on exertion.

## 2021-04-24 NOTE — ED Provider Notes (Signed)
[facility] EMERGENCY DEPARTMENT Provider Note   CSN: [id number] Arrival date & time: 04/24/21  1554     History Chief Complaint  Patient presents with   Abnormal Lab    [name] [name] is a 70 y.o. female.  Patient not seen by me.  Patient seen by Dr. 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   Hyperlipidemia    Hypertension    Hypertensive retinopathy    OU   Osteopenia    Panic attacks    Reflux esophagitis    Seizure ([place])    hx of   Tobacco abuse    Vitamin D deficiency     Patient Active Problem List   Diagnosis Date Noted   HFrEF (heart failure with reduced ejection fraction) ([place]) 03/27/2020   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   Right knee pain 11/02/2018   Eczema, dyshidrotic 05/04/2014   Hypomagnesemia 07/13/2013   Unspecified vitamin D deficiency 05/20/2013   Hypotension 02/08/2013   CAD (coronary artery disease) 05/30/2012   Tobacco abuse 05/30/2012   Anemia due to chronic kidney disease 05/30/2012   Osteopenia 10/05/2011   Atypical squamous cells of undetermined significance on cytologic smear of cervix (ASC-US) 09/21/2011   Left renal artery stenosis ([place]) 09/04/2011   CKD (chronic kidney disease) stage 4, GFR 15-29 ml/min ([place]) 07/30/2011   History of seizure 03/13/2011   Numbness and tingling in right hand 11/18/2007   HYPERCHOLESTEROLEMIA 01/10/2007   Anxiety and depression 09/02/2006   NEUROPATHY, PERIPHERAL 09/02/2006    Reflux esophagitis 09/02/2006   Regional enteritis ([place]) 09/02/2006    Past Surgical History:  Procedure Laterality Date   CATARACT EXTRACTION Bilateral    Dr. Clent Jacks   CATARACT EXTRACTION W/ INTRAOCULAR LENS  IMPLANT, BILATERAL  ~ 2000   CHOLECYSTECTOMY  01/28/2005   COLOSTOMY  03/1996   diverting   ECTOPIC PREGNANCY SURGERY  ?1980's   left   EYE SURGERY Bilateral    Cat Sx - Dr. Clent Jacks   ILEOSTOMY  ?  2002   NECK SURGERY  2020   "pinched nerve"     OB History     Gravida  2   Para  1   Term  1   Preterm  0   AB  1   Living  1      SAB  0   IAB  0   Ectopic  1   Multiple  0   Live Births              Family History  Problem Relation Age of Onset   Heart disease Mother    Other Mother        Covid   Glaucoma Mother    Stroke Father    Other Sister        AIDS    Social History   Tobacco Use  Smoking status: Every Day    Packs/day: 0.12    Years: 30.00    Pack years: 3.60    Types: Cigarettes   Smokeless tobacco: Never  Substance Use Topics   Alcohol use: No    Comment: 05/30/2012 "used to drink back in the day; last alcohol 23 yr ago"   Drug use: No    Home Medications Prior to Admission medications   Medication Sig Start Date End Date Taking? Authorizing Provider  ascorbic acid (VITAMIN C) 500 MG tablet Take by mouth. 01/21/19   [provider]  atorvastatin (LIPITOR) 40 MG tablet Take 1 tablet (40 mg total) by mouth daily. Stop taking simvastatin. 01/30/21   Zenia Resides, MD  BIDIL 20-37.5 MG tablet Take 1 tablet by mouth 3 (three) times daily. 05/09/20   [provider]  calcium citrate (CALCITRATE - DOSED IN MG ELEMENTAL CALCIUM) 950 (200 Ca) MG tablet Take by mouth daily. 01/21/19   [provider]  carvedilol (COREG) 6.25 MG tablet Take 1 tablet (6.25 mg total) by mouth 2 (two) times daily with a meal. 02/28/21   McDiarmid, Blane Ohara, MD  cholecalciferol (VITAMIN D) 1000 units tablet Take  1,000 Units by mouth daily.    [provider]  clopidogrel (PLAVIX) 75 MG tablet Take 1 tablet (75 mg total) by mouth daily. 08/13/20   Zenia Resides, MD  Ferrous Fumarate 86 (27 Fe) MG CAPS Take by mouth daily.    [provider]  hydrALAZINE (APRESOLINE) 25 MG tablet Take 1 tablet (25 mg total) by mouth 3 (three) times daily. 02/28/21 05/29/21  Geralynn Rile, MD  isosorbide mononitrate (IMDUR) 30 MG 24 hr tablet Take 1 tablet (30 mg total) by mouth daily. 06/19/20 09/17/20  O'NealCassie Freer, MD  lidocaine (XYLOCAINE) 2 % solution Use as directed 20 mLs in the mouth or throat as needed for mouth pain. 03/04/20   [place] American, PA-C  LORazepam (ATIVAN) 0.5 MG tablet TAKE ONE TABLET BY MOUTH TWICE DAILY AS NEEDED FOR ANXIETY 11/04/20   Zenia Resides, MD  Mesalamine 800 MG TBEC TAKE ONE TABLET BY MOUTH THREE TIMES DAILY 02/08/20   Zenia Resides, MD  mirtazapine (REMERON) 15 MG tablet Take 1 tablet (15 mg total) by mouth at bedtime. 10/07/20   McDiarmid, Blane Ohara, MD  Multiple Vitamin (MULTIVITAMIN) tablet Take 1 tablet by mouth 2 (two) times daily.    [provider]  pregabalin (LYRICA) 25 MG capsule Take 1 capsule (25 mg total) by mouth 2 (two) times daily. 03/31/21   Zenia Resides, MD  sodium bicarbonate 650 MG tablet Take 1,300 mg by mouth 2 (two) times daily.    [provider]  triamcinolone ointment (KENALOG) 0.1 % Apply 1 application topically 2 (two) times daily. Apply to skin around stoma 03/14/20   McDiarmid, Blane Ohara, MD    Allergies    Amoxicillin, Aspirin, Morphine and related, Penicillins, Gabapentin, Ciprofloxacin, and Ace inhibitors  Review of Systems   Review of Systems  Physical Exam Updated Vital Signs BP (!) 95/58 (BP Location: Right Arm)   Pulse 90   Temp 98.5 F (36.9 C) (Oral)   Resp (!) 24   SpO2 100%   Physical Exam  ED Results / Procedures / Treatments   Labs (all labs ordered are listed, but only  abnormal results are displayed) Labs Reviewed  CBC WITH DIFFERENTIAL/PLATELET - Abnormal; Notable for the following components:      Result Value  RBC 2.62 (*)    Hemoglobin 8.3 (*)    HCT 26.1 (*)    nRBC 0.6 (*)    All other components within normal limits  COMPREHENSIVE METABOLIC PANEL  BRAIN NATRIURETIC PEPTIDE    EKG None  Radiology DG Chest 2 View  Result Date: 04/24/2021 CLINICAL DATA:  Dyspnea on exertion. EXAM: CHEST - 2 VIEW COMPARISON:  October 09, 2015. FINDINGS: The heart size and mediastinal contours are within normal limits. Both lungs are clear. The visualized skeletal structures are unremarkable. IMPRESSION: No active cardiopulmonary disease. Electronically Signed   By: Marijo Conception [name]   On: 04/24/2021 17:21    Procedures Procedures   Medications Ordered in ED Medications - No data to display  ED Course  I have reviewed the triage vital signs and the nursing notes.  Pertinent labs & imaging results that were available during my care of the patient were reviewed by me and considered in my medical decision making (see chart for details).    MDM Rules/Calculators/A&P                            Final Clinical Impression(s) / ED Diagnoses Final diagnoses:  None    Rx / DC Orders ED Discharge Orders     None        Fredia Sorrow, MD 04/25/21 2329

## 2021-04-24 NOTE — ED Provider Notes (Signed)
Emergency Medicine Provider Triage Evaluation Note  [name] [name] , a 70 y.o. female  was evaluated in triage.  Pt complains of abnormal labs.  Patient was seen yesterday by her kidney doctor, patient is in stage V renal failure.  Reports that her lab work was abnormal and that she appeared dehydrated and should go to the ER.  Patient is not having any symptoms, not feeling nauseated, vomiting, having abdominal pain, fevers, chest pain, shortness of breath.  Patient has a colostomy bag, normal output..  Review of Systems  Positive: Abnormal lab Negative: See above  Physical Exam  BP (!) 95/58 (BP Location: Right Arm)   Pulse 90   Temp 98.5 F (36.9 C) (Oral)   Resp (!) 24   SpO2 100%  Gen:   Awake, no distress   Resp:  Normal effort  MSK:   Moves extremities without difficulty  Other:    Medical Decision Making  Medically screening exam initiated at 4:02 PM.  Appropriate orders placed.  [name] [name] was informed that the remainder of the evaluation will be completed by another provider, this initial triage assessment does not replace that evaluation, and the importance of remaining in the ED until their evaluation is complete.  Blood pressure is soft, vital stable.  We will check basic labs and get an EKG for weakness.    Sherrill Raring, PA-C 04/24/21 [place], [place], DO 04/25/21 2000

## 2021-04-25 ENCOUNTER — Inpatient Hospital Stay (HOSPITAL_COMMUNITY): Payer: Medicare Other

## 2021-04-25 ENCOUNTER — Encounter (HOSPITAL_COMMUNITY): Payer: Self-pay | Admitting: Student

## 2021-04-25 DIAGNOSIS — E872 Acidosis, unspecified: Secondary | ICD-10-CM | POA: Diagnosis not present

## 2021-04-25 DIAGNOSIS — Z79899 Other long term (current) drug therapy: Secondary | ICD-10-CM | POA: Diagnosis not present

## 2021-04-25 DIAGNOSIS — N19 Unspecified kidney failure: Secondary | ICD-10-CM | POA: Diagnosis not present

## 2021-04-25 DIAGNOSIS — N189 Chronic kidney disease, unspecified: Secondary | ICD-10-CM | POA: Diagnosis not present

## 2021-04-25 DIAGNOSIS — Z7902 Long term (current) use of antithrombotics/antiplatelets: Secondary | ICD-10-CM | POA: Diagnosis not present

## 2021-04-25 DIAGNOSIS — F410 Panic disorder [episodic paroxysmal anxiety] without agoraphobia: Secondary | ICD-10-CM | POA: Diagnosis present

## 2021-04-25 DIAGNOSIS — Z932 Ileostomy status: Secondary | ICD-10-CM | POA: Diagnosis not present

## 2021-04-25 DIAGNOSIS — Z432 Encounter for attention to ileostomy: Secondary | ICD-10-CM | POA: Diagnosis not present

## 2021-04-25 DIAGNOSIS — Z95828 Presence of other vascular implants and grafts: Secondary | ICD-10-CM | POA: Diagnosis not present

## 2021-04-25 DIAGNOSIS — E7800 Pure hypercholesterolemia, unspecified: Secondary | ICD-10-CM | POA: Diagnosis present

## 2021-04-25 DIAGNOSIS — D649 Anemia, unspecified: Secondary | ICD-10-CM | POA: Diagnosis not present

## 2021-04-25 DIAGNOSIS — I5022 Chronic systolic (congestive) heart failure: Secondary | ICD-10-CM | POA: Diagnosis not present

## 2021-04-25 DIAGNOSIS — N185 Chronic kidney disease, stage 5: Secondary | ICD-10-CM

## 2021-04-25 DIAGNOSIS — Z9049 Acquired absence of other specified parts of digestive tract: Secondary | ICD-10-CM | POA: Diagnosis not present

## 2021-04-25 DIAGNOSIS — F419 Anxiety disorder, unspecified: Secondary | ICD-10-CM

## 2021-04-25 DIAGNOSIS — E875 Hyperkalemia: Secondary | ICD-10-CM | POA: Diagnosis present

## 2021-04-25 DIAGNOSIS — Z20822 Contact with and (suspected) exposure to covid-19: Secondary | ICD-10-CM | POA: Diagnosis not present

## 2021-04-25 DIAGNOSIS — N179 Acute kidney failure, unspecified: Secondary | ICD-10-CM | POA: Diagnosis not present

## 2021-04-25 DIAGNOSIS — N289 Disorder of kidney and ureter, unspecified: Secondary | ICD-10-CM | POA: Diagnosis not present

## 2021-04-25 DIAGNOSIS — F32A Depression, unspecified: Secondary | ICD-10-CM | POA: Diagnosis not present

## 2021-04-25 DIAGNOSIS — N186 End stage renal disease: Secondary | ICD-10-CM | POA: Diagnosis present

## 2021-04-25 DIAGNOSIS — E559 Vitamin D deficiency, unspecified: Secondary | ICD-10-CM | POA: Diagnosis present

## 2021-04-25 DIAGNOSIS — D631 Anemia in chronic kidney disease: Secondary | ICD-10-CM | POA: Diagnosis not present

## 2021-04-25 DIAGNOSIS — Z992 Dependence on renal dialysis: Secondary | ICD-10-CM | POA: Diagnosis not present

## 2021-04-25 DIAGNOSIS — I132 Hypertensive heart and chronic kidney disease with heart failure and with stage 5 chronic kidney disease, or end stage renal disease: Secondary | ICD-10-CM | POA: Diagnosis not present

## 2021-04-25 DIAGNOSIS — Z4901 Encounter for fitting and adjustment of extracorporeal dialysis catheter: Secondary | ICD-10-CM | POA: Diagnosis not present

## 2021-04-25 DIAGNOSIS — Z8249 Family history of ischemic heart disease and other diseases of the circulatory system: Secondary | ICD-10-CM | POA: Diagnosis not present

## 2021-04-25 DIAGNOSIS — D6959 Other secondary thrombocytopenia: Secondary | ICD-10-CM | POA: Diagnosis not present

## 2021-04-25 DIAGNOSIS — I5020 Unspecified systolic (congestive) heart failure: Secondary | ICD-10-CM | POA: Diagnosis not present

## 2021-04-25 DIAGNOSIS — F17210 Nicotine dependence, cigarettes, uncomplicated: Secondary | ICD-10-CM | POA: Diagnosis not present

## 2021-04-25 DIAGNOSIS — I2510 Atherosclerotic heart disease of native coronary artery without angina pectoris: Secondary | ICD-10-CM | POA: Diagnosis not present

## 2021-04-25 DIAGNOSIS — Z933 Colostomy status: Secondary | ICD-10-CM | POA: Diagnosis not present

## 2021-04-25 DIAGNOSIS — E8722 Chronic metabolic acidosis: Secondary | ICD-10-CM | POA: Diagnosis not present

## 2021-04-25 DIAGNOSIS — E860 Dehydration: Secondary | ICD-10-CM | POA: Diagnosis not present

## 2021-04-25 DIAGNOSIS — J449 Chronic obstructive pulmonary disease, unspecified: Secondary | ICD-10-CM | POA: Diagnosis not present

## 2021-04-25 HISTORY — PX: IR US GUIDE VASC ACCESS RIGHT: IMG2390

## 2021-04-25 HISTORY — PX: IR FLUORO GUIDE CV LINE RIGHT: IMG2283

## 2021-04-25 HISTORY — PX: IR PERC TUN PERIT CATH WO PORT S&I /IMAG: IMG2327

## 2021-04-25 LAB — URINALYSIS, COMPLETE (UACMP) WITH MICROSCOPIC
Bilirubin Urine: NEGATIVE
Glucose, UA: NEGATIVE mg/dL
Hgb urine dipstick: NEGATIVE
Ketones, ur: NEGATIVE mg/dL
Nitrite: NEGATIVE
Protein, ur: NEGATIVE mg/dL
Specific Gravity, Urine: 1.008 (ref 1.005–1.030)
pH: 5 (ref 5.0–8.0)

## 2021-04-25 LAB — RENAL FUNCTION PANEL
Albumin: 3.3 g/dL — ABNORMAL LOW (ref 3.5–5.0)
Albumin: 3.4 g/dL — ABNORMAL LOW (ref 3.5–5.0)
Albumin: 3.3 g/dL — ABNORMAL LOW (ref 3.5–5.0)
Anion gap: 9 (ref 5–15)
Anion gap: 10 (ref 5–15)
Anion gap: 9 (ref 5–15)
BUN: 83 mg/dL — ABNORMAL HIGH (ref 8–23)
BUN: 82 mg/dL — ABNORMAL HIGH (ref 8–23)
BUN: 80 mg/dL — ABNORMAL HIGH (ref 8–23)
CO2: 13 mmol/L — ABNORMAL LOW (ref 22–32)
CO2: 13 mmol/L — ABNORMAL LOW (ref 22–32)
CO2: 13 mmol/L — ABNORMAL LOW (ref 22–32)
Calcium: 8.1 mg/dL — ABNORMAL LOW (ref 8.9–10.3)
Calcium: 8.1 mg/dL — ABNORMAL LOW (ref 8.9–10.3)
Calcium: 7.8 mg/dL — ABNORMAL LOW (ref 8.9–10.3)
Chloride: 113 mmol/L — ABNORMAL HIGH (ref 98–111)
Chloride: 109 mmol/L (ref 98–111)
Chloride: 111 mmol/L (ref 98–111)
Creatinine, Ser: 5.18 mg/dL — ABNORMAL HIGH (ref 0.44–1.00)
Creatinine, Ser: 5.22 mg/dL — ABNORMAL HIGH (ref 0.44–1.00)
Creatinine, Ser: 5.35 mg/dL — ABNORMAL HIGH (ref 0.44–1.00)
GFR, Estimated: 9 mL/min — ABNORMAL LOW (ref >60)
GFR, Estimated: 8 mL/min — ABNORMAL LOW (ref >60)
GFR, Estimated: 8 mL/min — ABNORMAL LOW (ref >60)
Glucose, Bld: 69 mg/dL — ABNORMAL LOW (ref 70–99)
Glucose, Bld: 85 mg/dL (ref 70–99)
Glucose, Bld: 137 mg/dL — ABNORMAL HIGH (ref 70–99)
Phosphorus: 4.1 mg/dL (ref 2.5–4.6)
Phosphorus: 4.8 mg/dL — ABNORMAL HIGH (ref 2.5–4.6)
Phosphorus: 4.4 mg/dL (ref 2.5–4.6)
Potassium: 3.8 mmol/L (ref 3.5–5.1)
Potassium: 3.9 mmol/L (ref 3.5–5.1)
Potassium: 3.9 mmol/L (ref 3.5–5.1)
Sodium: 135 mmol/L (ref 135–145)
Sodium: 132 mmol/L — ABNORMAL LOW (ref 135–145)
Sodium: 133 mmol/L — ABNORMAL LOW (ref 135–145)

## 2021-04-25 LAB — COMPREHENSIVE METABOLIC PANEL
ALT: 22 U/L (ref 0–44)
AST: 22 U/L (ref 15–41)
Albumin: 3.5 g/dL (ref 3.5–5.0)
Alkaline Phosphatase: 85 U/L (ref 38–126)
Anion gap: 9 (ref 5–15)
BUN: 84 mg/dL — ABNORMAL HIGH (ref 8–23)
CO2: 14 mmol/L — ABNORMAL LOW (ref 22–32)
Calcium: 8.6 mg/dL — ABNORMAL LOW (ref 8.9–10.3)
Chloride: 112 mmol/L — ABNORMAL HIGH (ref 98–111)
Creatinine, Ser: 5.56 mg/dL — ABNORMAL HIGH (ref 0.44–1.00)
GFR, Estimated: 8 mL/min — ABNORMAL LOW (ref >60)
Glucose, Bld: 176 mg/dL — ABNORMAL HIGH (ref 70–99)
Potassium: 4.2 mmol/L (ref 3.5–5.1)
Sodium: 135 mmol/L (ref 135–145)
Total Bilirubin: 0.7 mg/dL (ref 0.3–1.2)
Total Protein: 7.4 g/dL (ref 6.5–8.1)

## 2021-04-25 LAB — GLUCOSE, CAPILLARY: Glucose-Capillary: 115 mg/dL — ABNORMAL HIGH (ref 70–99)

## 2021-04-25 LAB — I-STAT VENOUS BLOOD GAS, ED
Acid-base deficit: 15 mmol/L — ABNORMAL HIGH (ref 0.0–2.0)
Bicarbonate: 10.6 mmol/L — ABNORMAL LOW (ref 20.0–28.0)
Calcium, Ion: 1.18 mmol/L (ref 1.15–1.40)
HCT: 26 % — ABNORMAL LOW (ref 36.0–46.0)
Hemoglobin: 8.8 g/dL — ABNORMAL LOW (ref 12.0–15.0)
O2 Saturation: 97 %
Potassium: 5.6 mmol/L — ABNORMAL HIGH (ref 3.5–5.1)
Sodium: 137 mmol/L (ref 135–145)
TCO2: 11 mmol/L — ABNORMAL LOW (ref 22–32)
pCO2, Ven: 24 mmHg — ABNORMAL LOW (ref 44.0–60.0)
pH, Ven: 7.253 (ref 7.250–7.430)
pO2, Ven: 103 mmHg — ABNORMAL HIGH (ref 32.0–45.0)

## 2021-04-25 LAB — IRON AND TIBC
Iron: 54 ug/dL (ref 28–170)
Saturation Ratios: 19 % (ref 10.4–31.8)
TIBC: 290 ug/dL (ref 250–450)
UIBC: 236 ug/dL

## 2021-04-25 LAB — CBC
HCT: 24.4 % — ABNORMAL LOW (ref 36.0–46.0)
Hemoglobin: 7.9 g/dL — ABNORMAL LOW (ref 12.0–15.0)
MCH: 31.9 pg (ref 26.0–34.0)
MCHC: 32.4 g/dL (ref 30.0–36.0)
MCV: 98.4 fL (ref 80.0–100.0)
Platelets: 141 10*3/uL — ABNORMAL LOW (ref 150–400)
RBC: 2.48 MIL/uL — ABNORMAL LOW (ref 3.87–5.11)
RDW: 13.9 % (ref 11.5–15.5)
WBC: 4.6 10*3/uL (ref 4.0–10.5)
nRBC: 0.7 % — ABNORMAL HIGH (ref 0.0–0.2)

## 2021-04-25 LAB — FOLATE: Folate: 75 ng/mL (ref >5.9)

## 2021-04-25 LAB — PROTEIN / CREATININE RATIO, URINE
Creatinine, Urine: 126.7 mg/dL
Protein Creatinine Ratio: 0.21 mg/mg{Cre} — ABNORMAL HIGH (ref 0.00–0.15)
Total Protein, Urine: 27 mg/dL

## 2021-04-25 LAB — FERRITIN: Ferritin: 156 ng/mL (ref 11–307)

## 2021-04-25 LAB — HEMOGLOBIN AND HEMATOCRIT, BLOOD
HCT: 23.3 % — ABNORMAL LOW (ref 36.0–46.0)
Hemoglobin: 7.5 g/dL — ABNORMAL LOW (ref 12.0–15.0)

## 2021-04-25 LAB — SODIUM, URINE, RANDOM: Sodium, Ur: <10 mmol/L

## 2021-04-25 LAB — RESP PANEL BY RT-PCR (FLU A&B, COVID) ARPGX2
Influenza A by PCR: NEGATIVE
Influenza B by PCR: NEGATIVE
SARS Coronavirus 2 by RT PCR: NEGATIVE

## 2021-04-25 LAB — HEPATITIS B SURFACE ANTIBODY,QUALITATIVE: Hep B S Ab: NONREACTIVE

## 2021-04-25 LAB — HEPATITIS B SURFACE ANTIGEN: Hepatitis B Surface Ag: NONREACTIVE

## 2021-04-25 LAB — VITAMIN B12: Vitamin B-12: 512 pg/mL (ref 180–914)

## 2021-04-25 LAB — PHOSPHORUS: Phosphorus: 3.6 mg/dL (ref 2.5–4.6)

## 2021-04-25 LAB — ABO/RH: ABO/RH(D): B POS

## 2021-04-25 LAB — OSMOLALITY, URINE: Osmolality, Ur: 231 mOsm/kg — ABNORMAL LOW (ref 300–900)

## 2021-04-25 LAB — HIV ANTIBODY (ROUTINE TESTING W REFLEX): HIV Screen 4th Generation wRfx: NONREACTIVE

## 2021-04-25 MED ORDER — HEPARIN SODIUM (PORCINE) 5000 UNIT/ML IJ SOLN
5000.0000 [IU] | Freq: Three times a day (TID) | INTRAMUSCULAR | Status: AC
  Administered 2021-04-25 – 2021-04-27 (×7): 5000 [IU] via SUBCUTANEOUS
  Filled 2021-04-25 (×7): qty 1

## 2021-04-25 MED ORDER — SODIUM CHLORIDE 0.9 % IV SOLN: 100.0000 mL | INTRAVENOUS | Status: DC | PRN

## 2021-04-25 MED ORDER — MESALAMINE 800 MG PO TBEC: 1.0000 | DELAYED_RELEASE_TABLET | Freq: Three times a day (TID) | ORAL | Status: DC

## 2021-04-25 MED ORDER — LORAZEPAM 0.5 MG PO TABS
0.5000 mg | ORAL_TABLET | Freq: Two times a day (BID) | ORAL | Status: DC | PRN
  Administered 2021-04-26 – 2021-04-28 (×4): 0.5 mg via ORAL
  Filled 2021-04-25 (×4): qty 1

## 2021-04-25 MED ORDER — LIDOCAINE HCL (PF) 1 % IJ SOLN: 5.0000 mL | INTRAMUSCULAR | Status: DC | PRN

## 2021-04-25 MED ORDER — HEPARIN SODIUM (PORCINE) 1000 UNIT/ML IJ SOLN
INTRAMUSCULAR | Status: AC
  Filled 2021-04-25: qty 1

## 2021-04-25 MED ORDER — DARBEPOETIN ALFA 40 MCG/0.4ML IJ SOSY
40.0000 ug | PREFILLED_SYRINGE | INTRAMUSCULAR | Status: DC
  Administered 2021-04-26: 40 ug via INTRAVENOUS
  Filled 2021-04-25: qty 0.4

## 2021-04-25 MED ORDER — ACETAMINOPHEN 650 MG RE SUPP: 650.0000 mg | Freq: Four times a day (QID) | RECTAL | Status: DC | PRN

## 2021-04-25 MED ORDER — KIDNEY FAILURE BOOK: Freq: Once | Status: AC

## 2021-04-25 MED ORDER — ACETAMINOPHEN 325 MG PO TABS
650.0000 mg | ORAL_TABLET | Freq: Four times a day (QID) | ORAL | Status: DC | PRN
  Administered 2021-04-26 – 2021-04-29 (×3): 650 mg via ORAL
  Filled 2021-04-25 (×4): qty 2

## 2021-04-25 MED ORDER — PENTAFLUOROPROP-TETRAFLUOROETH EX AERO: 1.0000 "application " | INHALATION_SPRAY | CUTANEOUS | Status: DC | PRN

## 2021-04-25 MED ORDER — FERROUS FUMARATE 86 (27 FE) MG PO CAPS: 86.0000 mg | ORAL_CAPSULE | Freq: Every day | ORAL | Status: DC

## 2021-04-25 MED ORDER — SODIUM CHLORIDE 0.9% IV SOLUTION: Freq: Once | INTRAVENOUS | Status: DC

## 2021-04-25 MED ORDER — MIRTAZAPINE 15 MG PO TABS
15.0000 mg | ORAL_TABLET | Freq: Every day | ORAL | Status: DC
  Administered 2021-04-25 – 2021-04-28 (×4): 15 mg via ORAL
  Filled 2021-04-25 (×4): qty 1

## 2021-04-25 MED ORDER — LIDOCAINE-EPINEPHRINE 1 %-1:100000 IJ SOLN
INTRAMUSCULAR | Status: AC
  Filled 2021-04-25: qty 1

## 2021-04-25 MED ORDER — FENTANYL CITRATE (PF) 100 MCG/2ML IJ SOLN
INTRAMUSCULAR | Status: AC
  Filled 2021-04-25: qty 2

## 2021-04-25 MED ORDER — HEPARIN SODIUM (PORCINE) 1000 UNIT/ML IJ SOLN
INTRAMUSCULAR | Status: AC | PRN
  Administered 2021-04-25: 3200 [IU] via INTRAVENOUS

## 2021-04-25 MED ORDER — MIDAZOLAM HCL 2 MG/2ML IJ SOLN
INTRAMUSCULAR | Status: AC
  Filled 2021-04-25: qty 2

## 2021-04-25 MED ORDER — PREGABALIN 25 MG PO CAPS
25.0000 mg | ORAL_CAPSULE | Freq: Two times a day (BID) | ORAL | Status: DC
  Administered 2021-04-25 – 2021-04-29 (×8): 25 mg via ORAL
  Filled 2021-04-25 (×8): qty 1

## 2021-04-25 MED ORDER — FENTANYL CITRATE (PF) 100 MCG/2ML IJ SOLN
INTRAMUSCULAR | Status: AC | PRN
  Administered 2021-04-25 (×2): 50 ug via INTRAVENOUS

## 2021-04-25 MED ORDER — LIDOCAINE-EPINEPHRINE 1 %-1:100000 IJ SOLN
INTRAMUSCULAR | Status: AC | PRN
  Administered 2021-04-25: 10 mL via INTRADERMAL

## 2021-04-25 MED ORDER — SODIUM CHLORIDE 0.9 % IV SOLN: INTRAVENOUS | Status: DC

## 2021-04-25 MED ORDER — LIDOCAINE-PRILOCAINE 2.5-2.5 % EX CREA: 1.0000 "application " | TOPICAL_CREAM | CUTANEOUS | Status: DC | PRN

## 2021-04-25 MED ORDER — VANCOMYCIN HCL IN DEXTROSE 1-5 GM/200ML-% IV SOLN
1000.0000 mg | INTRAVENOUS | Status: AC
  Administered 2021-04-25: 1000 mg via INTRAVENOUS
  Filled 2021-04-25: qty 200

## 2021-04-25 MED ORDER — SODIUM CHLORIDE 0.9 % IV SOLN
125.0000 mg | INTRAVENOUS | Status: DC
  Administered 2021-04-29: 125 mg via INTRAVENOUS
  Filled 2021-04-25 (×3): qty 10

## 2021-04-25 MED ORDER — MESALAMINE 400 MG PO CPDR
800.0000 mg | DELAYED_RELEASE_CAPSULE | Freq: Three times a day (TID) | ORAL | Status: DC
  Administered 2021-04-25 – 2021-04-29 (×11): 800 mg via ORAL
  Filled 2021-04-25 (×15): qty 2

## 2021-04-25 MED ORDER — FERROUS FUMARATE 324 (106 FE) MG PO TABS
1.0000 | ORAL_TABLET | Freq: Every day | ORAL | Status: DC
  Administered 2021-04-25: 106 mg via ORAL
  Filled 2021-04-25 (×2): qty 1

## 2021-04-25 MED ORDER — HEPARIN SODIUM (PORCINE) 1000 UNIT/ML DIALYSIS
1000.0000 [IU] | INTRAMUSCULAR | Status: DC | PRN
  Filled 2021-04-25: qty 1

## 2021-04-25 MED ORDER — SODIUM ZIRCONIUM CYCLOSILICATE 10 G PO PACK
10.0000 g | PACK | Freq: Once | ORAL | Status: AC
  Administered 2021-04-25: 10 g via ORAL
  Filled 2021-04-25: qty 1

## 2021-04-25 MED ORDER — MIDAZOLAM HCL 2 MG/2ML IJ SOLN
INTRAMUSCULAR | Status: AC | PRN
  Administered 2021-04-25 (×2): 1 mg via INTRAVENOUS

## 2021-04-25 MED ORDER — SODIUM BICARBONATE 650 MG PO TABS
1300.0000 mg | ORAL_TABLET | Freq: Two times a day (BID) | ORAL | Status: DC
  Administered 2021-04-25 – 2021-04-27 (×6): 1300 mg via ORAL
  Filled 2021-04-25 (×7): qty 2

## 2021-04-25 MED ORDER — ATORVASTATIN CALCIUM 40 MG PO TABS
40.0000 mg | ORAL_TABLET | Freq: Every day | ORAL | Status: DC
  Administered 2021-04-25 – 2021-04-29 (×5): 40 mg via ORAL
  Filled 2021-04-25 (×5): qty 1

## 2021-04-25 MED ORDER — CARVEDILOL 6.25 MG PO TABS
6.2500 mg | ORAL_TABLET | Freq: Two times a day (BID) | ORAL | Status: DC
  Administered 2021-04-25 – 2021-04-28 (×7): 6.25 mg via ORAL
  Filled 2021-04-25 (×3): qty 1
  Filled 2021-04-25: qty 2
  Filled 2021-04-25 (×4): qty 1

## 2021-04-25 MED ORDER — SODIUM BICARBONATE 8.4 % IV SOLN
INTRAVENOUS | Status: DC
  Filled 2021-04-25: qty 1000

## 2021-04-25 MED ORDER — CHLORHEXIDINE GLUCONATE CLOTH 2 % EX PADS
6.0000 | MEDICATED_PAD | Freq: Every day | CUTANEOUS | Status: DC
  Administered 2021-04-25 – 2021-04-28 (×3): 6 via TOPICAL

## 2021-04-25 MED ORDER — ALTEPLASE 2 MG IJ SOLR: 2.0000 mg | Freq: Once | INTRAMUSCULAR | Status: DC | PRN

## 2021-04-25 MED ORDER — CLOPIDOGREL BISULFATE 75 MG PO TABS
75.0000 mg | ORAL_TABLET | Freq: Every day | ORAL | Status: AC
  Administered 2021-04-26 – 2021-04-27 (×2): 75 mg via ORAL
  Filled 2021-04-25 (×3): qty 1

## 2021-04-25 MED ORDER — CALCIUM CITRATE 950 (200 CA) MG PO TABS
200.0000 mg | ORAL_TABLET | Freq: Three times a day (TID) | ORAL | Status: DC
  Administered 2021-04-25 – 2021-04-26 (×2): 200 mg via ORAL
  Filled 2021-04-25 (×5): qty 1

## 2021-04-25 NOTE — Consult Note (Addendum)
Hospital Consult  VASCULAR SURGERY ASSESSMENT & PLAN:   END-STAGE RENAL DISEASE: This patient has stage V chronic kidney disease which has progressed to end-stage renal disease.  She is having a dialysis catheter placed today so that she can initiate dialysis.  She is left-handed.  She has an IV in her left arm.  Her vein map is pending.  I suspect that her veins are quite small.  I will tentatively schedule her for a right AV fistula or AV graft on Monday.  She has palpable radial pulses.  I have explained the indications for placement of an AV fistula or AV graft. I've explained that if at all possible we will place an AV fistula.  I have reviewed the risks of placement of an AV fistula including but not limited to: failure of the fistula to mature, need for subsequent interventions, and thrombosis. In addition I have reviewed the potential complications of placement of an AV graft. These risks include, but are not limited to, graft thrombosis, graft infection, wound healing problems, bleeding, arm swelling, and steal syndrome. All the patient's questions were answered and they are agreeable to proceed with surgery.  Gae Gallop, MD 12:16 PM   Reason for Consult: Permanent dialysis access Requesting Physician: Dr. Joelyn Oms MRN #:  [id number]  History of Present Illness: This is a 70 y.o. female with multiple medical co morbidities including CKD stage 5 progressing to ESRD. She is now having some uremic symptoms and felt that she should consider initiation of dialysis. She has never had dialysis before. She has remote history of some sort of catheter or IV in her left chest wall. She says this was many years ago and she cannot recall what it was for. Otherwise denies any central lines, PICC lines or other central access. She does not have pacemaker or ICD. She is left handed. She does have some neuropathy in her right hand greater than left hand. She otherwise has never had any upper extremity  surgeries and has no limitations. 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 Clent Jacks   CATARACT EXTRACTION W/ INTRAOCULAR LENS  IMPLANT, BILATERAL  ~ 2000   CHOLECYSTECTOMY  01/28/2005   COLOSTOMY  03/1996   diverting   ECTOPIC PREGNANCY SURGERY  ?1980's   left   EYE SURGERY Bilateral    Cat Sx - Dr. Clent Jacks   ILEOSTOMY  ?  2002   NECK SURGERY  2020   "pinched nerve"  Allergies  Allergen Reactions   Amoxicillin Anaphylaxis, Rash and Other (See Comments)    Throat Swelling   Aspirin Anaphylaxis, Itching and Rash   Morphine And Related Anaphylaxis   Penicillins Anaphylaxis and Rash   Gabapentin Other (See Comments)    Patient had one time seizure shortly after stopping gabapentin   Ciprofloxacin Swelling    Possible reaction to cipro or clindamycin 04/29/14   Ace Inhibitors Other (See Comments) and Cough    ACE possibly associated with cough and switched to ARB.  Would be OK to re-challenge if needed.    Prior to Admission medications   Medication Sig Start Date End Date Taking? Authorizing Provider  ascorbic acid (VITAMIN C) 500 MG tablet Take 500  mg by mouth daily. 01/21/19  Yes [provider]  atorvastatin (LIPITOR) 40 MG tablet Take 1 tablet (40 mg total) by mouth daily. Stop taking simvastatin. 01/30/21  Yes Hensel, Jamal Collin, MD  BIDIL 20-37.5 MG tablet Take 1 tablet by mouth 3 (three) times daily. 05/09/20  Yes [provider]  calcium citrate (CALCITRATE - DOSED IN MG ELEMENTAL CALCIUM) 950 (200 Ca) MG tablet Take 200 mg of elemental calcium by mouth daily. 01/21/19  Yes [provider]  carvedilol (COREG) 12.5 MG tablet Take 12.5 mg by mouth in the morning and at bedtime. 04/08/21  Yes [provider]  cholecalciferol (VITAMIN D) 1000 units tablet Take 1,000 Units by mouth daily.   Yes [provider]  clopidogrel (PLAVIX) 75 MG tablet Take 1 tablet (75 mg total) by mouth daily. 08/13/20  Yes Hensel, Jamal Collin, MD  Ferrous Fumarate 86 (27 Fe) MG CAPS Take 86 mg by mouth daily.   Yes [provider]  hydrALAZINE (APRESOLINE) 25 MG tablet Take 1 tablet (25 mg total) by mouth 3 (three) times daily. 02/28/21 05/29/21 Yes O'Neal, Cassie Freer, MD  LORazepam (ATIVAN) 0.5 MG tablet TAKE ONE TABLET BY MOUTH TWICE DAILY AS NEEDED FOR ANXIETY Patient taking differently: Take 0.5 mg by mouth 2 (two) times daily as needed for anxiety. 11/04/20  Yes Hensel, Jamal Collin, MD  Mesalamine 800 MG TBEC TAKE ONE TABLET BY MOUTH THREE TIMES DAILY Patient taking differently: Take 800 mg by mouth in the morning, at noon, and at bedtime. 02/08/20  Yes Hensel, Jamal Collin, MD  mirtazapine (REMERON) 15 MG tablet Take 1 tablet (15 mg total) by mouth at bedtime. 10/07/20  Yes McDiarmid, Blane Ohara, MD  Multiple Vitamin (MULTIVITAMIN) tablet Take 1 tablet by mouth 2 (two) times daily.   Yes [provider]  pregabalin (LYRICA) 25 MG capsule Take 1 capsule (25 mg total) by mouth 2 (two) times daily. 03/31/21  Yes Hensel, Jamal Collin, MD  sodium bicarbonate 650 MG tablet Take 1,300 mg by mouth 2 (two) times daily.   Yes  [provider]  triamcinolone ointment (KENALOG) 0.1 % Apply 1 application topically 2 (two) times daily. Apply to skin around stoma 03/14/20  Yes McDiarmid, Blane Ohara, MD  carvedilol (COREG) 6.25 MG tablet Take 1 tablet (6.25 mg total) by mouth 2 (two) times daily with a meal. Patient not taking: Reported on 04/25/2021 02/28/21   McDiarmid, Blane Ohara, MD  isosorbide mononitrate (IMDUR) 30 MG 24 hr tablet Take 1 tablet (30 mg total) by mouth daily. 06/19/20 06/14/21  O'NealCassie Freer, MD    Social History   Socioeconomic History   Marital status: Single    Spouse name: Not on file   Number of children: 1  Years of education: associate   Highest education level: Not on file  Occupational History   Occupation: Retired-cook    Employer: [employer]  Tobacco Use   Smoking status: Every Day    Packs/day: 0.12    Years: 30.00    Pack years: 3.60    Types: Cigarettes   Smokeless tobacco: Never  Substance and Sexual Activity   Alcohol use: No    Comment: 05/30/2012 "used to drink back in the day; last alcohol 23 yr ago"   Drug use: No   Sexual activity: Not Currently  Other Topics Concern   Not on file  Social History Narrative   Health Care POA:    Emergency Contact: partner, Raj Janus, [phone]   End of Life Plan:    Who lives with you: no one 09/13/19, no pets   Any pets: cat- Wild Thing   Diet: pt has a varied diet of protein, starch, and vegetables    Exercise: Pt does not have any regular exercise routine   Seatbelts:Pt reports wearing seatbelt when in vehicles.    Nancy Fetter Exposure/Protection:    Hobbies: cooking, bingo, basketball         Social Determinants of Radio broadcast assistant Strain: Not on file  Food Insecurity: Not on file  Transportation Needs: Not on file  Physical Activity: Not on file  Stress: Not on file  Social Connections: Not on file  Intimate Partner Violence: Not on file     Family History  Problem Relation Age of Onset    Heart disease Mother    Other Mother        Covid   Glaucoma Mother    Stroke Father    Other Sister        AIDS    ROS: Otherwise negative unless mentioned in HPI  Physical Examination  Vitals:   04/25/21 0259 04/25/21 0649  BP: 113/63 115/69  Pulse: 92 96  Resp: 18 18  Temp: 98.2 F (36.8 C) 98.4 F (36.9 C)  SpO2: 100% 100%   Body mass index is 20.34 kg/m.  General:  WDWN in NAD Gait: Not observed HENT: WNL, normocephalic Pulmonary: normal non-labored breathing, without  wheezing Cardiac: regular Abdomen: flat, soft, non tender Vascular Exam/Pulses: 2+ radial pulses bilaterally, hands warm and well perfused. 5/5 grip strength bilaterally Musculoskeletal: no muscle wasting or atrophy  Neurologic: A&O X 3;  No focal weakness or paresthesias are detected; speech is fluent/normal Psychiatric:  The pt has Normal affect.   CBC    Component Value Date/Time   WBC 4.6 04/25/2021 0421   RBC 2.48 (L) 04/25/2021 0421   HGB 7.9 (L) 04/25/2021 0421   HGB 9.4 (L) 09/15/2019 1131   HCT 24.4 (L) 04/25/2021 0421   HCT 28.4 (L) 09/15/2019 1131   PLT 141 (L) 04/25/2021 0421   PLT 170 09/15/2019 1131   MCV 98.4 04/25/2021 0421   MCV 96 09/15/2019 1131   MCH 31.9 04/25/2021 0421   MCHC 32.4 04/25/2021 0421   RDW 13.9 04/25/2021 0421   RDW 13.2 09/15/2019 1131   LYMPHSABS 2.0 04/24/2021 1604   MONOABS 0.5 04/24/2021 1604   EOSABS 0.1 04/24/2021 1604   BASOSABS 0.0 04/24/2021 1604    BMET    Component Value Date/Time   NA 135 04/25/2021 0749   NA 143 03/25/2020 1112   K 3.8 04/25/2021 0749   CL 113 (H) 04/25/2021 0749   CO2 13 (L) 04/25/2021 0749   GLUCOSE 69 (L)  04/25/2021 0749   BUN 83 (H) 04/25/2021 0749   BUN 29 (H) 03/25/2020 1112   CREATININE 5.18 (H) 04/25/2021 0749   CREATININE 1.74 (H) 09/19/2014 1620   CALCIUM 8.1 (L) 04/25/2021 0749   CALCIUM 7.4 (L) 05/17/2013 1405   GFRNONAA 9 (L) 04/25/2021 0749   GFRNONAA 24 (L) 09/04/2011 1040   GFRAA 20 (L)  03/25/2020 1112   GFRAA 28 (L) 09/04/2011 1040    COAGS: No results found for: INR, PROTIME   Non-Invasive Vascular Imaging:   Upper extremity vein mapping pending  Statin:  Yes.   Beta Blocker:  Yes.   Aspirin:  No. ACEI:  No. ARB:  No. CCB use:  No Other antiplatelets/anticoagulants:  Yes.   Plavix   ASSESSMENT/PLAN: This is a 70 y.o. female with CKD stage 5 progressing to ESRD in need of permanent dialysis access. She is left handed. Upper extremity vein mapping is pending but will try for right upper extremity fistula if she has adequate veins. Discussed with patient AVF vs AVG and need for these. She is overwhelmed by all of this at this time. At patients request I talked with her daughter on the phone and answered her questions. I discussed that she will have [place] placed today and her permanent access can be arranged while she is in the hospital but it also can be scheduled as an outpatient if she goes home.Patient has been made NPO but she ate a full breakfast around 7:30/8 this morning so she will have [place] placed by IR later today - She is on Plavix which will need held for surgery. - Order placed for bilateral upper extremity vein mapping - On call vascular surgeon, Dr. Scot Dock will see patient later this afternoon and provide further management plans  Corrina Baglia PA-C [facility] [phone] 04/25/2021  11:00 AM

## 2021-04-25 NOTE — TOC Initial Note (Signed)
Transition of Care [facility]) - Initial/Assessment Note    Patient Details  Name: [name] [name] MRN: [id number] Date of Birth: [birth year]  [facility] CM/SW Contact:    Tom-Johnson, Renea Ee, RN Phone Number: 04/25/2021, 3:47 PM  Clinical Narrative:                 CM spoke with patient at bedside about TOC needs for post hospital transition. Patient was tearful upon entering room. Patient states she is to start dialysis, she knew it will happen sometime but did not expect it now. CM consoled and encouraged patient and let her know it's for the best. Patient present with dehydration and abnormal lab. Dx with acute on chronic renal failure. Scheduled for temporary HD cath today. Renal navigator to schedule outpatient dialysis at discharge. Patient lives alone but states her daughter will move in with her at discharge. Independent with care and drives self prior to hospitalization. 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 Has a cane and walker at home. Retired from [employer]. Has a PCP and medically insured by united healthcare and medicaid. Medical workup continues. CM will continue to follow with needs.        Expected Discharge Plan: Home/Self Care Barriers to Discharge: Continued Medical Work up   Patient Goals and CMS Choice Patient states their goals for this hospitalization and ongoing recovery are:: To go home CMS Medicare.gov Compare Post Acute Care list provided to:: Patient    Expected Discharge Plan and Services Expected Discharge Plan: Home/Self Care       Living arrangements for the past 2 months: Apartment                                      Prior Living Arrangements/Services Living arrangements for the past 2 months: Apartment Lives with:: Self Patient language and need for interpreter reviewed:: Yes Do you feel safe going back to the place where you live?: Yes      Need for Family Participation in Patient Care: Yes  (Comment) Care giver support system in place?: Yes (comment)   Criminal Activity/Legal Involvement Pertinent to Current Situation/Hospitalization: No - Comment as needed  Activities of Daily Living Home Assistive Devices/Equipment: Dentures (specify type) (Upper but left it at home) ADL Screening (condition at time of admission) Patient's cognitive ability adequate to safely complete daily activities?: Yes Is the patient deaf or have difficulty hearing?: No Does the patient have difficulty seeing, even when wearing glasses/contacts?: No Does the patient have difficulty concentrating, remembering, or making decisions?: No Patient able to express need for assistance with ADLs?: No Does the patient have difficulty dressing or bathing?: No Independently performs ADLs?: Yes (appropriate for developmental age) Does the patient have difficulty walking or climbing stairs?: No Weakness of Legs: Both Weakness of Arms/Hands: None  Permission Sought/Granted Permission sought to share information with : Case Manager, Family Supports Permission granted to share information with : Yes, Verbal Permission Granted              Emotional Assessment Appearance:: Appears stated age Attitude/Demeanor/Rapport: Engaged Affect (typically observed): Accepting, Tearful/Crying, Appropriate Orientation: : Oriented to Self, Oriented to Place, Oriented to  Time, Oriented to Situation Alcohol / Substance Use: Not Applicable Psych Involvement: No (comment)  Admission diagnosis:  Renal failure (ARF), acute on chronic (HCC) [N17.9, N18.9] Renal failure, unspecified chronicity [N19] Patient Active Problem List   Diagnosis Date  Noted   Renal failure (ARF), acute on chronic (HCC) 04/25/2021   Renal failure    CKD (chronic kidney disease) stage 5, GFR less than 15 ml/min (HCC)    HFrEF (heart failure with reduced ejection fraction) ([place]) 03/27/2020   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    Right knee pain 11/02/2018   Eczema, dyshidrotic 05/04/2014   Hypomagnesemia 07/13/2013   Unspecified vitamin D deficiency 05/20/2013   Hypotension 02/08/2013   CAD (coronary artery disease) 05/30/2012   Tobacco abuse 05/30/2012   Anemia due to chronic kidney disease 05/30/2012   Osteopenia 10/05/2011   Atypical squamous cells of undetermined significance on cytologic smear of cervix (ASC-US) 09/21/2011   Left renal artery stenosis ([place]) 09/04/2011   CKD (chronic kidney disease) stage 4, GFR 15-29 ml/min ([place]) 07/30/2011   History of seizure 03/13/2011   Numbness and tingling in right hand 11/18/2007   HYPERCHOLESTEROLEMIA 01/10/2007   Anxiety 09/02/2006   NEUROPATHY, PERIPHERAL 09/02/2006   Reflux esophagitis 09/02/2006   Regional enteritis ([place]) 09/02/2006   PCP:  Zenia Resides, MD Pharmacy:   [facility] [place] [address] [place] [address] Phone: [phone] Fax: [phone]     Social Determinants of Health (SDOH) Interventions    Readmission Risk Interventions No flowsheet data found.

## 2021-04-25 NOTE — Consult Note (Signed)
[place] Nurse Consult Note: Reason for Consult: Consulted by Nursing staff for ostomy supplies. Patient has a colostomy and is independent in her care. She uses a 1-piece Karaya pouch and a clamp closure, she does not like the Velcro-type closures. Patient seen once each in 2014 and in 2015 by my associates. Stoma type/location: LLQ Colostomy Stomal assessment/size: 1 and 3/4 inches round, raised, red, moist Peristomal assessment: intact Treatment options for stomal/peristomal skin: none Output brown stool, liquid Ostomy pouching: 1pc.cut-to-fit ostomy pouch Lawson # [id number] with Clamp Lawson # [id number] Education provided: Patient is taught that the Karaya pouches are no longer on the house formulary and that if it is her preference not to wear the house product while here, that her family may bring in a few pouches for her to use.  The clamp is familiar to the patient. Enrolled patient in [place] program: No. Patient is established with a supplier.  Discussed with Bedside RN, Kathlen [place].  [place] nursing team will not follow, but will remain available to this patient, the nursing and medical teams.  Please re-consult if needed. Thanks, Maudie Flakes, MSN, RN, [place], Arther Abbott  Pager# [phone]

## 2021-04-25 NOTE — H&P (Signed)
Chief Complaint: Patient was seen in consultation today for image guided tunneled dialysis catheter placement  Chief Complaint  Patient presents with   Abnormal Lab   at the request of Pearson Grippe   Referring Physician(s): Pearson Grippe   Supervising Physician: Ruthann Cancer  Patient Status: [facility] - In-pt  History of Present Illness: [name] [name] is a 70 y.o. female with PMHs of asthma, COPD, CAD on Plavix, CHF, GERD, HTN, HLD, anxiety, anemia, CKD stage 5 has been followed by Dr. Audie Clear [facility] who was directed to go to ED on 10/20 due to worsening of renal function.  Patient was found to have dehydration and metabolic acidosis, admitted and nephrology was consulted. Patient was seen Dr. Joelyn Oms today when treatment options for worsening CKD stage 5, now in ESRD, and patient decided to proceed with initiating hemodialysis.   IR was requested for [facility] placement.   Patient laying in bed, not in acute distress.  Patient is tearful, states that she is little bit overwhelmed by the situation.  Denise headache, fever, chills, shortness of breath, cough, chest pain, abdominal pain, nausea ,vomiting, and bleeding.   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 Clent Jacks   CATARACT EXTRACTION W/ INTRAOCULAR LENS  IMPLANT, BILATERAL  ~ 2000   CHOLECYSTECTOMY  01/28/2005   COLOSTOMY  03/1996   diverting   ECTOPIC PREGNANCY SURGERY  ?1980's   left   EYE SURGERY Bilateral    Cat Sx - Dr. Clent Jacks   ILEOSTOMY  ?  2002   NECK SURGERY  2020   "pinched nerve"    Allergies: Amoxicillin, Aspirin, Morphine and related, Penicillins, Gabapentin, Ciprofloxacin, and Ace inhibitors  Medications: Prior to Admission medications   Medication Sig Start Date End Date Taking? Authorizing Provider  ascorbic acid (VITAMIN C) 500 MG tablet Take 500 mg by mouth daily. 01/21/19  Yes [provider]  atorvastatin (LIPITOR) 40 MG tablet Take 1 tablet (40 mg total) by mouth daily. Stop taking simvastatin. 01/30/21  Yes Hensel, Jamal Collin, MD  BIDIL 20-37.5 MG tablet Take 1 tablet by mouth 3 (three) times daily. 05/09/20  Yes [provider]  calcium citrate (CALCITRATE - DOSED IN MG ELEMENTAL CALCIUM) 950 (200 Ca) MG tablet Take 200 mg of elemental calcium by mouth daily. 01/21/19  Yes [provider]  carvedilol (COREG) 12.5 MG tablet Take 12.5 mg by mouth in the morning and at bedtime. 04/08/21  Yes [provider]  cholecalciferol (VITAMIN D) 1000 units tablet Take 1,000 Units  by mouth daily.   Yes [provider]  clopidogrel (PLAVIX) 75 MG tablet Take 1 tablet (75 mg total) by mouth daily. 08/13/20  Yes Hensel, Jamal Collin, MD  Ferrous Fumarate 86 (27 Fe) MG CAPS Take 86 mg by mouth daily.   Yes [provider]  hydrALAZINE (APRESOLINE) 25 MG tablet Take 1 tablet (25 mg total) by mouth 3 (three) times daily. 02/28/21 05/29/21 Yes O'Neal, Cassie Freer, MD  LORazepam (ATIVAN) 0.5 MG tablet TAKE ONE TABLET BY MOUTH TWICE DAILY AS NEEDED FOR ANXIETY Patient taking differently: Take 0.5 mg by mouth 2 (two) times daily as needed for anxiety. 11/04/20  Yes Hensel, Jamal Collin, MD   Mesalamine 800 MG TBEC TAKE ONE TABLET BY MOUTH THREE TIMES DAILY Patient taking differently: Take 800 mg by mouth in the morning, at noon, and at bedtime. 02/08/20  Yes Hensel, Jamal Collin, MD  mirtazapine (REMERON) 15 MG tablet Take 1 tablet (15 mg total) by mouth at bedtime. 10/07/20  Yes McDiarmid, Blane Ohara, MD  Multiple Vitamin (MULTIVITAMIN) tablet Take 1 tablet by mouth 2 (two) times daily.   Yes [provider]  pregabalin (LYRICA) 25 MG capsule Take 1 capsule (25 mg total) by mouth 2 (two) times daily. 03/31/21  Yes Hensel, Jamal Collin, MD  sodium bicarbonate 650 MG tablet Take 1,300 mg by mouth 2 (two) times daily.   Yes [provider]  triamcinolone ointment (KENALOG) 0.1 % Apply 1 application topically 2 (two) times daily. Apply to skin around stoma 03/14/20  Yes McDiarmid, Blane Ohara, MD  carvedilol (COREG) 6.25 MG tablet Take 1 tablet (6.25 mg total) by mouth 2 (two) times daily with a meal. Patient not taking: Reported on 04/25/2021 02/28/21   McDiarmid, Blane Ohara, MD  isosorbide mononitrate (IMDUR) 30 MG 24 hr tablet Take 1 tablet (30 mg total) by mouth daily. 06/19/20 06/14/21  O'Neal, Cassie Freer, MD     Family History  Problem Relation Age of Onset   Heart disease Mother    Other Mother        Covid   Glaucoma Mother    Stroke Father    Other Sister        AIDS    Social History   Socioeconomic History   Marital status: Single    Spouse name: Not on file   Number of children: 1   Years of education: associate   Highest education level: Not on file  Occupational History   Occupation: Retired-cook    Employer: [employer]  Tobacco Use   Smoking status: Every Day    Packs/day: 0.12    Years: 30.00    Pack years: 3.60    Types: Cigarettes   Smokeless tobacco: Never  Substance and Sexual Activity   Alcohol use: No    Comment: 05/30/2012 "used to drink back in the day; last alcohol 23 yr ago"   Drug use: No   Sexual activity: Not Currently  Other  Topics Concern   Not on file  Social History Narrative   Health Care POA:    Emergency Contact: partner, Raj Janus, [phone]   End of Life Plan:    Who lives with you: no one 09/13/19, no pets   Any pets: cat- Wild Thing   Diet: pt has a varied diet of protein, starch, and vegetables    Exercise: Pt does not have any regular exercise routine   Seatbelts:Pt reports wearing seatbelt when in vehicles.    Nancy Fetter Exposure/Protection:  Hobbies: cooking, bingo, basketball         Social Determinants of Health   Financial Resource Strain: Not on file  Food Insecurity: Not on file  Transportation Needs: Not on file  Physical Activity: Not on file  Stress: Not on file  Social Connections: Not on file     Review of Systems: A 12 point ROS discussed and pertinent positives are indicated in the HPI above.  All other systems are negative.  Vital Signs: BP 115/69 (BP Location: Right Arm)   Pulse 96   Temp 98.4 F (36.9 C) (Oral)   Resp 18   Ht 5' 6"  (1.676 m) Comment: Simultaneous filing. User may not have seen previous data.  Wt 126 lb (57.2 kg) Comment: Simultaneous filing. User may not have seen previous data.  SpO2 100%   BMI 20.34 kg/m    Physical Exam Vitals reviewed.  Constitutional:      General: She is not in acute distress.    Appearance: Normal appearance. She is not ill-appearing.  HENT:     Head: Normocephalic and atraumatic.     Mouth/Throat:     Mouth: Mucous membranes are moist.  Cardiovascular:     Rate and Rhythm: Normal rate and regular rhythm.     Heart sounds: Normal heart sounds.  Pulmonary:     Effort: Pulmonary effort is normal.     Breath sounds: Normal breath sounds.  Abdominal:     General: Abdomen is flat.     Palpations: Abdomen is soft.  Musculoskeletal:     Cervical back: Normal range of motion and neck supple.  Skin:    General: Skin is warm and dry.     Coloration: Skin is not jaundiced or pale.  Neurological:     Mental Status:  She is alert and oriented to person, place, and time.  Psychiatric:        Behavior: Behavior normal.        Judgment: Judgment normal.     Comments: Nervous, pt tearful     MD Evaluation Airway: WNL Heart: WNL Abdomen: WNL Chest/ Lungs: WNL ASA  Classification: 3 Mallampati/Airway Score: One  Imaging: DG Chest 2 View  Result Date: 04/24/2021 CLINICAL DATA:  Dyspnea on exertion. EXAM: CHEST - 2 VIEW COMPARISON:  October 09, 2015. FINDINGS: The heart size and mediastinal contours are within normal limits. Both lungs are clear. The visualized skeletal structures are unremarkable. IMPRESSION: No active cardiopulmonary disease. Electronically Signed   By: Marijo Conception [name]   On: 04/24/2021 17:21    Labs:  CBC: Recent Labs    04/24/21 1604 04/25/21 0007 04/25/21 0421  WBC 4.8  --  4.6  HGB 8.3* 8.8* 7.9*  HCT 26.1* 26.0* 24.4*  PLT 152  --  141*    COAGS: No results for input(s): INR, APTT in the last 8760 hours.  BMP: Recent Labs    04/24/21 1604 04/25/21 0007 04/25/21 0421 04/25/21 0749  NA 133* 137 135 135  K 5.1 5.6* 4.2 3.8  CL 110  --  112* 113*  CO2 12*  --  14* 13*  GLUCOSE 112*  --  176* 69*  BUN 84*  --  84* 83*  CALCIUM 9.0  --  8.6* 8.1*  CREATININE 5.46*  --  5.56* 5.18*  GFRNONAA 8*  --  8* 9*    LIVER FUNCTION TESTS: Recent Labs    04/24/21 1604 04/25/21 0421 04/25/21 0749  BILITOT 0.8 0.7  --  AST 20 22  --   ALT 21 22  --   ALKPHOS 92 85  --   PROT 7.6 7.4  --   ALBUMIN 3.7 3.5 3.3*    TUMOR MARKERS: No results for input(s): AFPTM, CEA, CA199, CHROMGRNA in the last 8760 hours.  Assessment and Plan: 71 y.o. female with worsening of CKD 5 now with ESRD presenting with uremia, who is in need of dialysis access. After through discussion with nephrology, patient decided to proceed with initiation of hemodialysis.   IR was requested for [facility] placement.  Ate breakfast at 7:30, made NPO at 10:16.  VSS CBC with worsening of chronic  anemia hgb 7.9, thrombocytopenia 141  RF BUN 83, creatinine 5.18, GFR 9  Last Plavix on 10/20 - no need for d/c per IR anticoag protocol   Risks and benefits discussed with the patient including, but not limited to bleeding, infection, vascular injury, pneumothorax which may require chest tube placement, air embolism.  All of the patient's questions were answered, patient is agreeable to proceed. Consent signed and in chart.   Thank you for this interesting consult.  I greatly enjoyed meeting [name] [name] and look forward to participating in their care.  A copy of this report was sent to the requesting provider on this date.  Electronically Signed: Tera Mater, PA-C 04/25/2021, 11:27 AM   I spent a total of 20 Minutes    in face to face in clinical consultation, greater than 50% of which was counseling/coordinating care for [facility] placement   This chart was dictated using voice recognition software.  Despite best efforts to proofread,  errors can occur which can change the documentation meaning.

## 2021-04-25 NOTE — Progress Notes (Signed)
Requested to see pt for out-pt HD arrangements. Met with pt at bedside. Pt is from home alone and resides in [place]. Prior to admission, pt's nephrologist is in [facility]. Discussed with pt out-pt HD options. Pt prefers to receive out-pt HD in [place] close to her home. Will proceed with referral to [facility] admissions. Discussed referral process and will f/u with pt regarding clinic and chair time once known. Will follow and assist.    Melven Sartorius Renal Navigator [phone]

## 2021-04-25 NOTE — Progress Notes (Signed)
FPTS Brief Progress Note  S: Pt resting sounding in HD.    O: BP 121/62   Pulse 77   Temp 97.9 F (36.6 C)   Resp 14   Ht 5' 6"  (1.676 m) Comment: Simultaneous filing. User may not have seen previous data.  Wt 57.2 kg Comment: Simultaneous filing. User may not have seen previous data.  SpO2 100%   BMI 20.34 kg/m     A/P: VSS Asked the HD personnel to transfuse 1u while HD session is underway. Agreed.  - Follow up post transfusion/HD H&H.  - Orders reviewed. Added labs for AM.    Lyndee Hensen, DO 04/25/2021, 10:29 PM PGY-3, [facility] Night Resident  Please page [phone] with questions.

## 2021-04-25 NOTE — Consult Note (Signed)
[name] [name] Admit Date: 04/24/2021 04/25/2021 Rexene Agent Requesting Physician:  Andria Frames MD  Reason for Consult:  Progressive CKD5, Acidosis HPI:  52F PMH including CKD 5 followed by Dr. Audie Clear with [facility], presumably hypertensive disease; anemia; Crohn's disease with ileostomy; COPD with ongoing tobacco use; history of ASCVD; hyperlipidemia; hypertension; chronic systolic heart failure who was directed to present to the emergency room because of worsening renal function.  Patient seen 10/19 by outpatient nephrology and preparatory steps for vascular access were in consideration.  Her creatinine on 10/19 was 5.06 and her recent baseline of been around 3.5, with the most recent value being in June of this year.  With this increase in creatinine she was directed to the emergency room and here her creatinine was confirmed at 5.46, 5.18 here today.  Patient denies use of NSAIDs.  No change in ileostomy output.  No recent antibiotics or other medications.  She has a chronic metabolic acidosis related to her CKD and ostomy status for which she takes sodium bicarbonate.  Her potassium at presentation was 5.6 but is currently 3.8.  She denies nausea/vomiting, anorexia, itching/hiccups, chest pain, shortness of breath.  However she does say for the past 2 weeks that she has been very crummy feeling and does not have an explanation.  We discussed that she has progressive CKD and no absolute indications for immediate initiation of dialysis but that her crummy feeling is likely the onset of uremia and initiating dialysis would be reasonable if she desired.   Creat (mg/dL)  Date Value  09/19/2014 1.74 (H)  05/04/2014 1.66 (H)  01/19/2014 1.72 (H)  07/12/2013 2.06 (H)  10/12/2012 1.91 (H)  12/01/2011 1.65 (H)  10/07/2011 1.54 (H)  09/11/2011 1.58 (H)  09/04/2011 2.17 (H)  07/29/2011 1.95 (H)   Creatinine, Ser (mg/dL)  Date Value  04/25/2021 5.18 (H)  04/25/2021 5.56 (H)  04/24/2021  5.46 (H)  03/25/2020 2.78 (H)  01/04/2020 3.07 (H)  06/14/2019 2.05 (H)  11/24/2017 2.19 (H)  04/12/2017 2.57 (H)  11/11/2016 2.87 (H)  10/09/2015 2.30 (H)  ] I/Os: I/O last 3 completed shifts: In: 788.2 [P.O.:240; I.V.:48.2; IV Piggyback:500] Out: 100 [Urine:100]   ROS NSAIDS: no exposure IV Contrast no exposure TMP/SMX no exosure Hypotension not present Balance of 12 systems is negative w/ exceptions as above  PMH  Past Medical History:  Diagnosis Date   Abnormal chest CT    Coronary atherosclerosis on chest CT 2012   Anemia, chronic renal failure    Anxiety    Asthma 05/2011   CAD (coronary artery disease)    Carpal tunnel syndrome    CHF (congestive heart failure) (HCC)    EF 30-35% 2012->EF 60-65% 2013   CKD (chronic kidney disease), stage III (HCC)    Crohn's disease ([place])    GERD (gastroesophageal reflux disease)    Headache(784.0)    "related to high BP" (05/30/2012)   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 Clent Jacks   CATARACT EXTRACTION W/ INTRAOCULAR LENS  IMPLANT, BILATERAL  ~ 2000   CHOLECYSTECTOMY  01/28/2005   COLOSTOMY  03/1996  diverting   ECTOPIC PREGNANCY SURGERY  ?1980's   left   EYE SURGERY Bilateral    Cat Sx - Dr. Clent Jacks   ILEOSTOMY  ?  2002   NECK SURGERY  2020   "pinched nerve"   FH  Family History  Problem Relation Age of Onset   Heart disease Mother    Other Mother        Covid   Glaucoma Mother    Stroke Father    Other Sister        AIDS   SH  reports that she has been smoking cigarettes. She has a 3.60 pack-year smoking history. She has never used smokeless tobacco. She reports that she does not drink alcohol and does not use  drugs. Allergies  Allergies  Allergen Reactions   Amoxicillin Anaphylaxis, Rash and Other (See Comments)    Throat Swelling   Aspirin Anaphylaxis, Itching and Rash   Morphine And Related Anaphylaxis   Penicillins Anaphylaxis and Rash   Gabapentin Other (See Comments)    Patient had one time seizure shortly after stopping gabapentin   Ciprofloxacin Swelling    Possible reaction to cipro or clindamycin 04/29/14   Ace Inhibitors Other (See Comments) and Cough    ACE possibly associated with cough and switched to ARB.  Would be OK to re-challenge if needed.   Home medications Prior to Admission medications   Medication Sig Start Date End Date Taking? Authorizing Provider  ascorbic acid (VITAMIN C) 500 MG tablet Take 500 mg by mouth daily. 01/21/19  Yes [provider]  atorvastatin (LIPITOR) 40 MG tablet Take 1 tablet (40 mg total) by mouth daily. Stop taking simvastatin. 01/30/21  Yes Hensel, Jamal Collin, MD  BIDIL 20-37.5 MG tablet Take 1 tablet by mouth 3 (three) times daily. 05/09/20  Yes [provider]  calcium citrate (CALCITRATE - DOSED IN MG ELEMENTAL CALCIUM) 950 (200 Ca) MG tablet Take 200 mg of elemental calcium by mouth daily. 01/21/19  Yes [provider]  carvedilol (COREG) 12.5 MG tablet Take 12.5 mg by mouth in the morning and at bedtime. 04/08/21  Yes [provider]  cholecalciferol (VITAMIN D) 1000 units tablet Take 1,000 Units by mouth daily.   Yes [provider]  clopidogrel (PLAVIX) 75 MG tablet Take 1 tablet (75 mg total) by mouth daily. 08/13/20  Yes Hensel, Jamal Collin, MD  Ferrous Fumarate 86 (27 Fe) MG CAPS Take 86 mg by mouth daily.   Yes [provider]  hydrALAZINE (APRESOLINE) 25 MG tablet Take 1 tablet (25 mg total) by mouth 3 (three) times daily. 02/28/21 05/29/21 Yes O'Neal, Cassie Freer, MD  LORazepam (ATIVAN) 0.5 MG tablet TAKE ONE TABLET BY MOUTH TWICE DAILY AS NEEDED FOR ANXIETY Patient taking differently:  Take 0.5 mg by mouth 2 (two) times daily as needed for anxiety. 11/04/20  Yes Hensel, Jamal Collin, MD  Mesalamine 800 MG TBEC TAKE ONE TABLET BY MOUTH THREE TIMES DAILY Patient taking differently: Take 800 mg by mouth in the morning, at noon, and at bedtime. 02/08/20  Yes Hensel, Jamal Collin, MD  mirtazapine (REMERON) 15 MG tablet Take 1 tablet (15 mg total) by mouth at bedtime. 10/07/20  Yes McDiarmid, Blane Ohara, MD  Multiple Vitamin (MULTIVITAMIN) tablet Take 1 tablet by mouth 2 (two) times daily.   Yes [provider]  pregabalin (LYRICA) 25 MG capsule Take 1 capsule (25 mg total) by mouth 2 (two) times daily. 03/31/21  Yes Hensel, Jamal Collin, MD  sodium bicarbonate 650 MG tablet Take 1,300 mg by mouth 2 (two) times daily.   Yes [provider]  triamcinolone ointment (KENALOG) 0.1 % Apply 1 application topically 2 (two) times daily. Apply to skin around stoma 03/14/20  Yes McDiarmid, Blane Ohara, MD  carvedilol (COREG) 6.25 MG tablet Take 1 tablet (6.25 mg total) by mouth 2 (two) times daily with a meal. Patient not taking: Reported on 04/25/2021 02/28/21   McDiarmid, Blane Ohara, MD  isosorbide mononitrate (IMDUR) 30 MG 24 hr tablet Take 1 tablet (30 mg total) by mouth daily. 06/19/20 06/14/21  Geralynn Rile, MD    Current Medications Scheduled Meds:  atorvastatin  40 mg Oral Daily   calcium citrate  200 mg of elemental calcium Oral TID WC   carvedilol  6.25 mg Oral BID WC   Chlorhexidine Gluconate Cloth  6 each Topical Q0600   clopidogrel  75 mg Oral Daily   Ferrous Fumarate  1 tablet Oral Daily   heparin  5,000 Units Subcutaneous Q8H   Mesalamine  800 mg Oral TID   mirtazapine  15 mg Oral QHS   pregabalin  25 mg Oral BID   sodium bicarbonate  1,300 mg Oral BID   Continuous Infusions:  sodium chloride 75 mL/hr at 04/25/21 0521   PRN Meds:.acetaminophen **OR** acetaminophen, LORazepam  CBC Recent Labs  Lab 04/24/21 1604 04/25/21 0007 04/25/21 0421  WBC 4.8  --  4.6   NEUTROABS 2.2  --   --   HGB 8.3* 8.8* 7.9*  HCT 26.1* 26.0* 24.4*  MCV 99.6  --  98.4  PLT 152  --  893*   Basic Metabolic Panel Recent Labs  Lab 04/24/21 1604 04/25/21 0007 04/25/21 0421 04/25/21 0749  NA 133* 137 135 135  K 5.1 5.6* 4.2 3.8  CL 110  --  112* 113*  CO2 12*  --  14* 13*  GLUCOSE 112*  --  176* 69*  BUN 84*  --  84* 83*  CREATININE 5.46*  --  5.56* 5.18*  CALCIUM 9.0  --  8.6* 8.1*  PHOS  --   --  3.6 4.1    Physical Exam   Blood pressure 115/69, pulse 96, temperature 98.4 F (36.9 C), temperature source Oral, resp. rate 18, height 5' 6"  (1.676 m), weight 57.2 kg, SpO2 100 %. GEN: NAD, chronically ill-appearing ENT: NCAT EYES: EOMI CV: Regular, normal S1 and S2, 3 out of 6 systolic murmur present, no rub PULM: Clear bilaterally ABD: Soft, ostomy bag noted SKIN: No rashes or lesions EXT: No edema  Assessment 56F CKD5 preesnting with uremia and now ESRD, to start iHD  New ESRD Chronic metabolic acidosis 2/2 #1 and #3 Ileostomy present Anemia of CKD CKD-BMD, Ca and P at goal HTN, BPs stable CAD sCHF, chronic Hx/o chrons disease COPD  Plan Pt given option of close monitoring or starting HD, and she has chosen to start HD Will need TDC and AVF/G,  Consult [facility], if they can't get to [facility] today will d/w IR HD after [facility], #1 CLIP patient Start ESA and Check Fe values Check PTH  Rexene Agent  04/25/2021, 10:16 AM

## 2021-04-25 NOTE — Progress Notes (Addendum)
FMTS Attending Daily Note: [name], MD  Team Pager [phone] Pager [phone]  I have seen and examined this patient, reviewed their chart. I have discussed this patient with the resident physician.  Addendums to below note include:  Anxious woman, no distress + tunnelled line in R chest wall, Clean dry and intact  Cardiac: Regular rate and rhythm. Normal S1/S2. No murmurs, rubs, or gallops appreciated. Lungs: Clear bilaterally to ascultation.  Abdomen: Normoactive bowel sounds. No tenderness to deep or light palpation. No rebound or guarding.  + ostomy bag, ostomy site with small amount of pink tissue  Psych: Pleasant and appropriate   Anemia, likely of chronic disease, transfusion threshold 8 given CAD. Repeat 7.5. Type and screen, tranfuse X1 unit. Baseline Hgb is 8-9, last CSY on file 2007, will monitor very closely for GI blood loss. If persists, will ask GI to weigh in. Iron studies show saturation of 19%, suggestive of relative IDA.  Crohn's disease likely contributing. Monitor for GU blood loss as well.    I agree with the remainder of the findings, exam, and plan below.   Disposition: Pending HD, clinical progress    Family Medicine Teaching Service Daily Progress Note Intern Pager: [phone]  Patient name: [name] [name] Medical record number: [id number] Date of birth: [birth year] Age: 70 y.o. Gender: female  Primary Care Provider: Zenia Resides, MD Consultants: Dr. Joelyn Oms, Nephrology Code Status: Full  Pt Overview and Major Events to Date:  10/20: pt admitted for acute on chronic renal failure  Assessment and Plan:  [name] [name] is a 70 yo female presenting with acute on chronic kidney disease and symptoms of uremia. PMH is significant for CKD stage V, anemia of chronic disease, Crohn's disease, COPD, CAD, HLD, HTN, and Tobacco use.  Acute on Chronic Kidney failure  CKD Stage V  Metabolic acidosis  Pt was AAOx3 this morning with no acute events overnight. Pt  denies any major complaints this morning. She states she is still feeling fatigued as she has felt the past two weeks. She denies any issues with voiding, urinary frequency, or hematuria. Pt was made aware of the plan to consult nephro today and that hemodialysis is a strong possibility for the next step, as suggested by her outpatient nephrology physician who recommends HD or PD when GFR is less than 15. Pt was upset with the news but understands it may be the best option at this time. Will converse with patient again today after consulting nephro.  Pt's most recent GFR is 9 with creatinine of 5.18 and BUN 83, all slight improvements from previous labs. Initial hyperkalemia on arrival has since resolved after treatment with 10g Lokelma, most recent K+ of 3.8. Will repeat VBG today to monitor metabolic acidosis, as well as monitor BUN.  Overall, pt seems to be stable and no longer declining in terms of her acute on chronic renal failure. Will continue to monitor labs. Consulted nephro, appreciate and will follow recommendations to start HD today, vascular is involved.  -Admit to FPTS, Attending Dr. Andria Frames -Avoid nephrotoxic agents - No blood draws in the left arm - Follow up 12 lead EKG - Daily Lab: RFP, CBC - Follow up renal US  - Consulted nephrology, Dr. Joelyn Oms. Appreciate and will follow recommendations for dialysis.  - UA shows hazy urine and moderate leukocytes, suggests dehydration - Follow up urine studies:  -Pr/Cr ratio 0.21 -Osm 231 -Na <10 - Cardiorespiratory Monitoring - Heparin for VTE prophylaxis - Discontinued fluids 75 ml/hr d/t  HD this afternoon --Renal diet with boost supplement QID (NPO 10/21 for vascular access this PM)  Crohn's disease s/p colectomy & ileostomy Pt denies any abdominal pain this morning but requests more education on the colostomy bags used here at [facility] as she is used to a different type. Pt states the drainage from her bag yesterday was not because it was  overflowing but because it was not sealed properly.  Mesalamine 800 mg 3 times daily, Kenalog 0.1% topical lotion -change colostomy bag BID -Boost supplements QID  Chronic systolic CHF  HFrEF 35-70%  Pt denies any SOB, chest pain, or swelling this morning. Last echo done in 2021. CXR in ED showed no cardiopulmonary abnormalities. On exam, patient does have some bibasilar crackles but no JVD or LE edema.  -daily labs: BNP, CBC -will consider echo pending clinical course -cardiorespiratory monitoring -restart home meds when med-rec is completed (Coreg 6.25 mg BID)  Hypertension BP this morning was stable at 115/69.  -continue daily vitals -assess need for home meds once med rec completed (hydralazine 25 mg TID, Coreg 6.25 mg BID and BiDil 20-37.5 mg TID)  Hyperlipidemia Home medication lipitor 40 mg daily -continue home medication once med rec is complete  CAD Stable/chronic. Home medication of lipitor 40 mg daily and plavix 75 mg daily. -restart home meds once med rec complete  Anemia of chronic disease Pt has a hemoglobin of 7.9 this morning. Will continue to monitor CBC. Take home medication ferrous sulfate 86 mg daily. May consider starting EPO medication, discuss possible transfusion today.  -daily CBC, H and H this afternoon -start home med once med rec complete -transfusion threshold <8, monitor closely with HD today, so far pt is asymptomatic -iron studies, B12, folate (possible crohn's related)  Peripheral Neuropathy vs Radiculopathy Pt is still complaining of tingling/numbness in her upper and lower extremities. Pt experiencing parasthesia on exam of her right foot up to mid shin. Left lower extremity sensation in tact on exam. Lyrica 25 mg BID home med. -restart home med once med rec is complete  Severe Anxiety and Depression Chronic. Pt had a pleasant affect and mood this morning. Pt was a little upset when realizing she may need to start hemodialysis but was  understanding of why it may be the next step. Home meds of mirtazepine 15 mg bedtime and ativan 0.69m BID PRN.  -continue home meds once med rec completed.   COPD  Tobacco use disorder Chronic. Pt declining nicotine patch at this time. Does not use inhalers for COPD.   FEN/GI: Renal diet with Boost supplement QID PPx: Heparin Dispo: Progressive  Subjective:  Pt seen at bedside this morning with no acute events overnight. Pt denies any SOB, chest pain, or LE edema. Pt complaining of parasthesias in right LE. Pt aware of nephrology's recommendation to start HD. Pt is overwhelmed but understands this is the next best step.   Objective: Temp:  [98.2 F (36.8 C)-98.5 F (36.9 C)] 98.4 F (36.9 C) (10/21 0649) Pulse Rate:  [74-96] 96 (10/21 0649) Resp:  [11-24] 18 (10/21 0649) BP: (95-127)/(56-107) 115/69 (10/21 0649) SpO2:  [98 %-100 %] 100 % (10/21 0649) Weight:  [57.2 kg] 57.2 kg (10/21 0259) Physical Exam: General: Awake, well appearing, NAD Cardiovascular: RRR, no murmurs Respiratory: Bibasilar crackles, no wheezing, nonlabored breathing on RA Abdomen: Soft, no distention, colostomy bag on left abdomen with no drainage Extremities: parasthesias noted on right foot up to mid shift, no edema Derm: dry, warm Neuro: oriented x3 Psych:  anxious, normal affect  Laboratory: Recent Labs  Lab 04/24/21 1604 04/25/21 0007 04/25/21 0421  WBC 4.8  --  4.6  HGB 8.3* 8.8* 7.9*  HCT 26.1* 26.0* 24.4*  PLT 152  --  141*   Recent Labs  Lab 04/24/21 1604 04/25/21 0007 04/25/21 0421 04/25/21 0749  NA 133* 137 135 135  K 5.1 5.6* 4.2 3.8  CL 110  --  112* 113*  CO2 12*  --  14* 13*  BUN 84*  --  84* 83*  CREATININE 5.46*  --  5.56* 5.18*  CALCIUM 9.0  --  8.6* 8.1*  PROT 7.6  --  7.4  --   BILITOT 0.8  --  0.7  --   ALKPHOS 92  --  85  --   ALT 21  --  22  --   AST 20  --  22  --   GLUCOSE 112*  --  176* 69*      Imaging/Diagnostic Tests:   Angus Seller, Medical  Student 04/25/2021, 9:53 AM MS4, [place] Intern pager: [phone], text pages welcome

## 2021-04-25 NOTE — Plan of Care (Signed)
Nutrition Education Note  RD consulted for Renal Education. Provided the following educational handouts: Renal Food Guide Pyramid, Nutrition for People on Dialysis, All About Protein for People on HD, Sodium and Kidney Disease, Making Sense of Phosphorus, Potassium Content of Foods, and Low and High Potassium Fruits and Vegetables. Reviewed food groups and provided written recommended serving sizes specifically determined for patient's current nutritional status.   Explained why diet restrictions are needed and provided lists of foods to limit/avoid that are high potassium, sodium, and phosphorus. Provided specific recommendations on safer alternatives of these foods. Strongly encouraged compliance of this diet.   Discussed importance of protein intake at each meal and snack. Provided examples of how to maximize protein intake throughout the day. Discussed need for fluid restriction with dialysis, importance of minimizing weight gain between HD treatments, and renal-friendly beverage options.  Encouraged pt to discuss specific diet questions/concerns with RD at HD outpatient facility. Teach back method used.  Expect fair compliance.  Body mass index is 20.34 kg/m. Pt meets criteria for normal based on current BMI.  Current diet order is renal, patient is consuming approximately 100% of meals at this time. Labs and medications reviewed. No further nutrition interventions warranted at this time. RD contact information provided. If additional nutrition issues arise, please re-consult RD.  Larkin Ina, MS, RD, LDN (she/her/hers) RD pager number and weekend/on-call pager number located in [place].

## 2021-04-25 NOTE — Progress Notes (Signed)
Round on patient at the bedside today secondary to necessity to initiate dialysis. Patient's daughter, Clarene Critchley, also brought to the room. Educated patient and daughter on the process of hemodialysis. Patient and daughter also educated on dietary and fluid restrictions for hemodialysis patients. Patient also educated on the resources she would find in the outpatient setting.  Patient reports that she hadn't previously informed her daughter of the extent of her kidney damage because she didn't want to worry her, but she does want her to be informed moving forward. Daughters phone number corrected in [place]. Patient and daughter with all questions answered at this time. Will continue follow.   Mady Gemma, RN Dialysis Nurse Coordinator [phone]

## 2021-04-26 ENCOUNTER — Inpatient Hospital Stay (HOSPITAL_COMMUNITY): Payer: Medicare Other

## 2021-04-26 DIAGNOSIS — Z432 Encounter for attention to ileostomy: Secondary | ICD-10-CM | POA: Diagnosis not present

## 2021-04-26 DIAGNOSIS — N19 Unspecified kidney failure: Secondary | ICD-10-CM | POA: Diagnosis not present

## 2021-04-26 DIAGNOSIS — N186 End stage renal disease: Secondary | ICD-10-CM

## 2021-04-26 DIAGNOSIS — F419 Anxiety disorder, unspecified: Secondary | ICD-10-CM | POA: Diagnosis not present

## 2021-04-26 LAB — HEPATITIS B SURFACE ANTIBODY, QUANTITATIVE: Hep B S AB Quant (Post): <3.1 m[IU]/mL — ABNORMAL LOW (ref >9.9)

## 2021-04-26 LAB — RENAL FUNCTION PANEL
Albumin: 3.3 g/dL — ABNORMAL LOW (ref 3.5–5.0)
Anion gap: 9 (ref 5–15)
BUN: 32 mg/dL — ABNORMAL HIGH (ref 8–23)
CO2: 22 mmol/L (ref 22–32)
Calcium: 8.1 mg/dL — ABNORMAL LOW (ref 8.9–10.3)
Chloride: 103 mmol/L (ref 98–111)
Creatinine, Ser: 2.85 mg/dL — ABNORMAL HIGH (ref 0.44–1.00)
GFR, Estimated: 18 mL/min — ABNORMAL LOW (ref >60)
Glucose, Bld: 95 mg/dL (ref 70–99)
Phosphorus: 2.8 mg/dL (ref 2.5–4.6)
Potassium: 3 mmol/L — ABNORMAL LOW (ref 3.5–5.1)
Sodium: 134 mmol/L — ABNORMAL LOW (ref 135–145)

## 2021-04-26 LAB — BPAM RBC
Blood Product Expiration Date: 202210252359
ISSUE DATE / TIME: 202210212303
Unit Type and Rh: 7300

## 2021-04-26 LAB — CBC
HCT: 27.1 % — ABNORMAL LOW (ref 36.0–46.0)
Hemoglobin: 9.2 g/dL — ABNORMAL LOW (ref 12.0–15.0)
MCH: 30.6 pg (ref 26.0–34.0)
MCHC: 33.9 g/dL (ref 30.0–36.0)
MCV: 90 fL (ref 80.0–100.0)
Platelets: 121 10*3/uL — ABNORMAL LOW (ref 150–400)
RBC: 3.01 MIL/uL — ABNORMAL LOW (ref 3.87–5.11)
RDW: 14.8 % (ref 11.5–15.5)
WBC: 6.5 10*3/uL (ref 4.0–10.5)
nRBC: 0.5 % — ABNORMAL HIGH (ref 0.0–0.2)

## 2021-04-26 LAB — TYPE AND SCREEN
ABO/RH(D): B POS
Antibody Screen: NEGATIVE
Unit division: 0

## 2021-04-26 LAB — PARATHYROID HORMONE, INTACT (NO CA): PTH: 125 pg/mL — ABNORMAL HIGH (ref 15–65)

## 2021-04-26 LAB — GLUCOSE, CAPILLARY
Glucose-Capillary: 98 mg/dL (ref 70–99)
Glucose-Capillary: 109 mg/dL — ABNORMAL HIGH (ref 70–99)
Glucose-Capillary: 117 mg/dL — ABNORMAL HIGH (ref 70–99)
Glucose-Capillary: 127 mg/dL — ABNORMAL HIGH (ref 70–99)

## 2021-04-26 MED ORDER — ONDANSETRON HCL 4 MG/2ML IJ SOLN
4.0000 mg | Freq: Three times a day (TID) | INTRAMUSCULAR | Status: DC | PRN
  Administered 2021-04-26: 4 mg via INTRAVENOUS
  Filled 2021-04-26: qty 2

## 2021-04-26 NOTE — Progress Notes (Signed)
Upper extremity venous mapping study completed.   Please see CV Proc for preliminary results.   Darlin Coco, RDMS, RVT

## 2021-04-26 NOTE — Evaluation (Signed)
Physical Therapy Evaluation Patient Details Name: [name] [name] MRN: [id number] DOB: 1950/08/13 Today's Date: 04/26/2021  History of Present Illness  Pt is a 70 y.o. F admitted 04/24/21 for acute on chronic kidney failure; images significant for CKD stage V. RIJ TDC placed 10/21 by IR, HD start 10/21. Significant PMH: CKD, Crohn's disease s/p colectomy and ileostomy, anemia of chronic disease, COPD, CAD, HTN, and tobacco use.  Clinical Impression  PTA, pt lives in an one level apartment and is independent. Pt presents with mild balance deficits and decreased endurance. She is pleasant and motivated to regain her independence. Pt able to complete ADL task of lower body dressing and bathing in standing vs sitting position. Ambulating 200 feet with no assistive device at a supervision level. SpO2 97% on RA, HR 87-103 bpm. Pt will benefit from continued acute PT to address deficits, however, do not anticipate will need PT follow up.     Recommendations for follow up therapy are one component of a multi-disciplinary discharge planning process, led by the attending physician.  Recommendations may be updated based on patient status, additional functional criteria and insurance authorization.  Follow Up Recommendations No PT follow up;Supervision for mobility/OOB    Equipment Recommendations  None recommended by PT    Recommendations for Other Services       Precautions / Restrictions Precautions Precautions: Fall Restrictions Weight Bearing Restrictions: No      Mobility  Bed Mobility Overal bed mobility: Modified Independent                  Transfers Overall transfer level: Modified independent Equipment used: None                Ambulation/Gait Ambulation/Gait assistance: Supervision Gait Distance (Feet): 200 Feet Assistive device: None Gait Pattern/deviations: Step-through pattern;Decreased stride length Gait velocity: decreased   General Gait Details: Pt with  increased trunk/hip flexion, slow and cautious gait, one mild lateral LOB which she was able to self correct. Cues for stopping to "reset," with imbalance  Stairs            Wheelchair Mobility    Modified Rankin (Stroke Patients Only)       Balance Overall balance assessment: Needs assistance Sitting-balance support: Feet supported Sitting balance-Leahy Scale: Normal     Standing balance support: During functional activity;No upper extremity supported Standing balance-Leahy Scale: Good Standing balance comment: Pt able to remove her underwear and wash up with no UE support                             Pertinent Vitals/Pain Pain Assessment: Faces Faces Pain Scale: Hurts a little bit Pain Location: RIJ site Pain Descriptors / Indicators: Discomfort Pain Intervention(s): Monitored during session    Home Living Family/patient expects to be discharged to:: Private residence Living Arrangements: Children (daughter) Available Help at Discharge: Family;Available PRN/intermittently Type of Home: Apartment (1st floor apartment) Home Access: Level entry     Home Layout: One level Home Equipment: [place] - 2 wheels;Cane - single point      Prior Function Level of Independence: Independent         Comments: enjoys Radio producer Dominance        Extremity/Trunk Assessment   Upper Extremity Assessment Upper Extremity Assessment: RUE deficits/detail RUE Deficits / Details: Pt reports right arm numbness    Lower Extremity Assessment Lower Extremity Assessment: Overall WFL for tasks assessed  Communication   Communication: No difficulties  Cognition Arousal/Alertness: Awake/alert Behavior During Therapy: WFL for tasks assessed/performed Overall Cognitive Status: Within Functional Limits for tasks assessed                                        General Comments      Exercises     Assessment/Plan    PT Assessment  Patient needs continued PT services  PT Problem List Decreased strength;Decreased activity tolerance;Decreased balance;Decreased mobility;Cardiopulmonary status limiting activity       PT Treatment Interventions Gait training;Stair training;Functional mobility training;Therapeutic activities;Therapeutic exercise;Balance training;Patient/family education    PT Goals (Current goals can be found in the Care Plan section)  Acute Rehab PT Goals Patient Stated Goal: remain independent PT Goal Formulation: With patient Time For Goal Achievement: 05/10/21 Potential to Achieve Goals: Good    Frequency Min 3X/week   Barriers to discharge        Co-evaluation               AM-PAC PT "6 Clicks" Mobility  Outcome Measure Help needed turning from your back to your side while in a flat bed without using bedrails?: None Help needed moving from lying on your back to sitting on the side of a flat bed without using bedrails?: None Help needed moving to and from a bed to a chair (including a wheelchair)?: None Help needed standing up from a chair using your arms (e.g., wheelchair or bedside chair)?: None Help needed to walk in hospital room?: A Little Help needed climbing 3-5 steps with a railing? : A Little 6 Click Score: 22    End of Session   Activity Tolerance: Patient tolerated treatment well Patient left: in chair;with call bell/phone within reach Nurse Communication: Mobility status PT Visit Diagnosis: Unsteadiness on feet (R26.81)    Time: 8882-8003 PT Time Calculation (min) (ACUTE ONLY): 25 min   Charges:   PT Evaluation $PT Eval Moderate Complexity: 1 Mod PT Treatments $Therapeutic Activity: 8-22 mins        [name] [name], PT, DPT Acute Rehabilitation Services Pager [phone] Office [phone]   [name] [name] 04/26/2021, 12:42 PM

## 2021-04-26 NOTE — Progress Notes (Signed)
Admit: 04/24/2021 LOS: 1  24F CKD5 presenting with uremia and now ESRD, start iHD  Subjective:  HD#1 yesterday after TDC with IR; 0.5L UF CLIP in process VVS following for AVF/G early next week  10/21 0701 - 10/22 0700 In: 403.3 [P.O.:240; IV Piggyback:163.3] Out: 1250 [Urine:200; Stool:550]  Filed Weights   04/25/21 0259 04/26/21 0029  Weight: 57.2 kg 54.5 kg    Scheduled Meds:  sodium chloride   Intravenous Once   atorvastatin  40 mg Oral Daily   calcium citrate  200 mg of elemental calcium Oral TID WC   carvedilol  6.25 mg Oral BID WC   Chlorhexidine Gluconate Cloth  6 each Topical Q0600   clopidogrel  75 mg Oral Daily   darbepoetin (ARANESP) injection - DIALYSIS  40 mcg Intravenous Q Fri-HD   Ferrous Fumarate  1 tablet Oral Daily   heparin  5,000 Units Subcutaneous Q8H   Mesalamine  800 mg Oral TID   mirtazapine  15 mg Oral QHS   pregabalin  25 mg Oral BID   sodium bicarbonate  1,300 mg Oral BID   Continuous Infusions:  ferric gluconate (FERRLECIT) IVPB     PRN Meds:.acetaminophen **OR** acetaminophen, LORazepam  Current Labs: reviewed  Results for [name], [name] (MRN [id number]) as of 04/26/2021 08:57  Ref. Range 04/25/2021 12:11  PTH, Intact Latest Ref Range: 15 - 65 pg/mL 125 (H)   Results for [name], [name] (MRN [id number]) as of 04/26/2021 08:57  Ref. Range 04/25/2021 12:11  Saturation Ratios Latest Ref Range: 10.4 - 31.8 % 19  Ferritin Latest Ref Range: 11 - 307 ng/mL 156   Physical Exam:  Blood pressure 116/63, pulse 77, temperature 98.7 F (37.1 C), temperature source Oral, resp. rate 18, height 5' 6"  (1.676 m), weight 54.5 kg, SpO2 97 %. GEN: NAD, chronically ill-appearing ENT: NCAT EYES: EOMI CV: Regular, normal S1 and S2, 3 out of 6 systolic murmur present, no rub PULM: Clear bilaterally ABD: Soft, ostomy bag noted SKIN: No rashes or lesions EXT: No edema  A New ESRD HD start 04/25/21 R IJ TDC placed 10/21 by IR VVS following for  AVG?F CLIP in process Next HD 46/56 Chronic metabolic acidosis 2/2 #1 and #3: cont PO NaHCO3 at this time 2/2 #3 Ileostomy present Anemia of CKD on Fe and ESA CKD-BMD, Ca and P at goal, stop Ca Citrate HTN, BPs stable, Carvedilol CAD sCHF, chronic Hx/o chrons disease COPD  P As above Next HD 10/24 Medication Issues; Preferred narcotic agents for pain control are hydromorphone, fentanyl, and methadone. Morphine should not be used.  Baclofen should be avoided Avoid oral sodium phosphate and magnesium citrate based laxatives / bowel preps    Pearson Grippe MD 04/26/2021, 8:56 AM  Recent Labs  Lab 04/25/21 1211 04/25/21 1924 04/26/21 0407  NA 132* 133* 134*  K 3.9 3.9 3.0*  CL 109 111 103  CO2 13* 13* 22  GLUCOSE 85 137* 95  BUN 82* 80* 32*  CREATININE 5.22* 5.35* 2.85*  CALCIUM 8.1* 7.8* 8.1*  PHOS 4.8* 4.4 2.8   Recent Labs  Lab 04/24/21 1604 04/25/21 0007 04/25/21 0421 04/25/21 1211 04/26/21 0407  WBC 4.8  --  4.6  --  6.5  NEUTROABS 2.2  --   --   --   --   HGB 8.3*   < > 7.9* 7.5* 9.2*  HCT 26.1*   < > 24.4* 23.3* 27.1*  MCV 99.6  --  98.4  --  90.0  PLT 152  --  141*  --  121*   < > = values in this interval not displayed.

## 2021-04-26 NOTE — Consult Note (Signed)
[place] Nurse ostomy follow up Reconsulted regarding ostomy supplies.  Supply orders provided yesterday. Checked with Bedside RN and patient is wearing new pouch.  [place] nursing team will not follow, but will remain available to this patient, the nursing and medical teams.  Please re-consult if needed. Thanks, Maudie Flakes, MSN, RN, [place], Arther Abbott  Pager# [phone]

## 2021-04-26 NOTE — Evaluation (Signed)
Occupational Therapy Evaluation Patient Details Name: [name] [name] MRN: [id number] DOB: 1950/11/18 Today's Date: 04/26/2021   History of Present Illness Pt is a 70 y.o. F admitted 04/24/21 for acute on chronic kidney failure; images significant for CKD stage V. RIJ TDC placed 10/21 by IR, HD start 10/21. Significant PMH: CKD, Crohn's disease s/p colectomy and ileostomy, anemia of chronic disease, COPD, CAD, HTN, and tobacco use.   Clinical Impression   [name] [name] was indep prior to the above admission, she lives alone in a 1 level apartment & drives. Upon evaluation pt was mod I level for all ADLs and functional mobility within the room; she does not have any acute OT needs. OT will sign off. Recommend d/c to home with supervision initally for ADLs and mobility - pt reports she has asked her daughter to come stay with her for awhile at d/c.    Recommendations for follow up therapy are one component of a multi-disciplinary discharge planning process, led by the attending physician.  Recommendations may be updated based on patient status, additional functional criteria and insurance authorization.   Follow Up Recommendations  No OT follow up;Supervision - Intermittent    Equipment Recommendations  None recommended by OT       Precautions / Restrictions Precautions Precautions: Fall Restrictions Weight Bearing Restrictions: No      Mobility Bed Mobility Overal bed mobility: Modified Independent                  Transfers Overall transfer level: Modified independent Equipment used: None                  Balance Overall balance assessment: Needs assistance Sitting-balance support: Feet supported Sitting balance-Leahy Scale: Normal     Standing balance support: During functional activity;No upper extremity supported Standing balance-Leahy Scale: Good                             ADL either performed or assessed with clinical judgement   ADL Overall ADL's :  At baseline;Modified independent                                       General ADL Comments: pt completed BADLs at mod I level for incrased time and some compensatory techniqes. No AD. Pt is likely very close to her baseline despite slight deminised sensation of RUE and limited activity tolerance     Vision Baseline Vision/History: 0 No visual deficits Ability to See in Adequate Light: 0 Adequate Patient Visual Report: No change from baseline Vision Assessment?: No apparent visual deficits     Perception     Praxis      Pertinent Vitals/Pain Pain Assessment: No/denies pain Pain Intervention(s): Monitored during session     Hand Dominance Right   Extremity/Trunk Assessment Upper Extremity Assessment Upper Extremity Assessment: RUE deficits/detail RUE Deficits / Details: deminished sensation of RUE RUE Sensation: decreased light touch RUE Coordination: decreased fine motor   Lower Extremity Assessment Lower Extremity Assessment: Overall WFL for tasks assessed   Cervical / Trunk Assessment Cervical / Trunk Assessment: Normal   Communication Communication Communication: No difficulties   Cognition Arousal/Alertness: Awake/alert Behavior During Therapy: WFL for tasks assessed/performed Overall Cognitive Status: Within Functional Limits for tasks assessed  General Comments: sleeping upon arrival, easy to rouse. Pleasant and motivated.   General Comments  VSS on RA, pt with reports of being sleepy    Exercises     Shoulder Instructions      Home Living Family/patient expects to be discharged to:: Private residence Living Arrangements: Children Available Help at Discharge: Family;Available PRN/intermittently Type of Home: Apartment Home Access: Level entry     Home Layout: One level     Bathroom Shower/Tub: Teacher, early years/pre: Standard     Home Equipment: Environmental consultant - 2 wheels;Cane -  single point   Additional Comments: Pt states she has had a BSC and toilet riser in the past - she does not like them because they do not stay clean & are difficult for her to clean      Prior Functioning/Environment Level of Independence: Independent        Comments: enjoys writing        OT Problem List: Decreased activity tolerance;Pain      OT Treatment/Interventions:      OT Goals(Current goals can be found in the care plan section) Acute Rehab OT Goals Patient Stated Goal: remain independent OT Goal Formulation: All assessment and education complete, DC therapy  OT Frequency:     Barriers to D/C:            Co-evaluation              AM-PAC OT "6 Clicks" Daily Activity     Outcome Measure Help from another person eating meals?: None Help from another person taking care of personal grooming?: None Help from another person toileting, which includes using toliet, bedpan, or urinal?: None Help from another person bathing (including washing, rinsing, drying)?: None Help from another person to put on and taking off regular upper body clothing?: None Help from another person to put on and taking off regular lower body clothing?: None 6 Click Score: 24   End of Session Nurse Communication: Mobility status  Activity Tolerance: Patient tolerated treatment well Patient left: in bed;with call bell/phone within reach;with bed alarm set  OT Visit Diagnosis: Other abnormalities of gait and mobility (R26.89);Pain                Time: 1521-1535 OT Time Calculation (min): 14 min Charges:  OT General Charges $OT Visit: 1 Visit OT Evaluation $OT Eval Low Complexity: 1 Low   [name] [name] A [name] [name] 04/26/2021, 4:18 PM

## 2021-04-26 NOTE — Progress Notes (Signed)
FPTS Brief Progress Note  S: In bed sleeping.    O: BP 130/69 (BP Location: Left Arm)   Pulse 91   Temp 98.4 F (36.9 C) (Oral)   Resp 18   Ht 5' 6"  (1.676 m) Comment: Simultaneous filing. User may not have seen previous data.  Wt 54.5 kg   SpO2 97%   BMI 19.39 kg/m   General: Sleeping, no acute distress. Age appropriate. Respiratory: normal effort  A/P: Acute on chronic kidney failure  metabolic acidosis - Orders reviewed. Labs for AM ordered, which was adjusted as needed.   Gerlene Fee, DO 04/26/2021, 9:43 PM PGY-3, [facility] Night Resident  Please page [phone] with questions.

## 2021-04-26 NOTE — Progress Notes (Signed)
Family Medicine Teaching Service Daily Progress Note Intern Pager: [phone]  Patient name: [name] [name] Medical record number: [id number] Date of birth: [birth year] Age: 70 y.o. Gender: female  Primary Care Provider: Zenia Resides, MD Consultants: Neurology Code Status: Full  Pt Overview and Major Events to Date:  10/20 - admitted 10/21- first dialysis  Assessment and Plan:  [name] [name] is a 70 year old female who was admitted for acute on chronic kidney failure.  Images significant for CKD stage V, Crohn's disease, anemia of chronic disease, COPD, CAD, HTN, HLD, and tobacco use  Acute on chronic kidney failure  metabolic acidosis Patient had no acute events overnight.  She had her first ever  dialysis completed last night.  There was no complaints or incidents during the procedure. Patient currently has a temporary dialysis catheter in place. VVS  Plans to complete either a right AV fistula or AV graft on Monday (10/24).  Renal ultrasound showed no acute renal findings. Her creatinine after dialysis improved to 2.85, bun 32 and GFR 18. Patient reports feeling fatigued after her dialysis, will put in PT/OT eval for her. Nephrology following, appreciate recs -VS following, appreciate recs -Daily lab; RFP -PT/OT eval -Avoid nephrotoxic agents -Continue daily cardiorespiratory monitoring  Crohn's disease s/p colectomy and ileostomy On exam patient's colostomy bag well placed on the left side of the abdomen shows no sign of drainage on exam.  She has no abdominal tenderness or distention on exam -Continue daily routine of changing colostomy bag -Continue boost supplements QID  Chronic systolic CHF  HFrEF 25 to 30% Patient is showing no signs of CHF exacerbation.  She denies any shortness of breath or chest pain.  On exam has no crackles,  JVD or BLE edema  CAD Chronic and stable.  Home medication of Lipitor 40 mg daily and Plavix 75 mg daily -Continue home  medication  Anemia of chronic disease Patient's hemoglobin is improved this morning at 9.2.  Her anemia is most likely linked to her chronic kidney disease.  Transfusion threshold is <8 would consider GI consult should she continue to show consistent decrease in Hgb -Daily CBC  Hypertension On home medication of hydralazine 25 mg 3 times daily, Coreg 6.25 mg twice daily and Bidil 20-37.5 mg 3 times daily -Continue routine vitals -Continue home medications  Hyperlipidemia Chronic, stable: Continue home medication of Lipitor 40 mg daily  Peripheral neuropathy Patient continues to experience tingling and numbness of the extremities.  She still have positive paresthesia of the right foot up to shin with normal sensation on the left lower extremities.  On home medication of Lyrica twice daily -Continue Lyrica 25 mg twice daily   FEN/GI: Renal diet with boost supplements 4 times daily PPx: Heparin Dispo:Home pending clinical improvement .  Subjective:  [name] [name] said she is doing fine. She is tired and trying to get rest. Still have some soreness around here right shoulder where the dialysis catheter was placed.   Objective: Temp:  [97.9 F (36.6 C)-98.7 F (37.1 C)] 98.7 F (37.1 C) (10/22 0109) Pulse Rate:  [70-96] 77 (10/22 0109) Resp:  [11-22] 18 (10/22 0109) BP: (61-153)/(45-78) 153/76 (10/22 0109) SpO2:  [96 %-100 %] 100 % (10/22 0109) Weight:  [54.5 kg] 54.5 kg (10/22 0029) Physical Exam: General: Awake, fatigued, NAD Cardiovascular: RRR, No murmur, normal S1/S2 Respiratory: CTAB, no wheezing or crackles. Abdomen: Soft, no distension or tenderness Extremities: no BLE edema, +2 pedal and radial pulse   Laboratory: Recent Labs  Lab  04/24/21 1604 04/25/21 0007 04/25/21 0421 04/25/21 1211  WBC 4.8  --  4.6  --   HGB 8.3* 8.8* 7.9* 7.5*  HCT 26.1* 26.0* 24.4* 23.3*  PLT 152  --  141*  --    Recent Labs  Lab 04/24/21 1604 04/25/21 0007 04/25/21 0421  04/25/21 0749 04/25/21 1211 04/25/21 1924  NA 133*   < > 135 135 132* 133*  K 5.1   < > 4.2 3.8 3.9 3.9  CL 110  --  112* 113* 109 111  CO2 12*  --  14* 13* 13* 13*  BUN 84*  --  84* 83* 82* 80*  CREATININE 5.46*  --  5.56* 5.18* 5.22* 5.35*  CALCIUM 9.0  --  8.6* 8.1* 8.1* 7.8*  PROT 7.6  --  7.4  --   --   --   BILITOT 0.8  --  0.7  --   --   --   ALKPHOS 92  --  85  --   --   --   ALT 21  --  22  --   --   --   AST 20  --  22  --   --   --   GLUCOSE 112*  --  176* 69* 85 137*   < > = values in this interval not displayed.     Imaging/Diagnostic Tests: US RENAL  Result Date: 04/25/2021 CLINICAL DATA:  70 year old female with acute renal insufficiency. EXAM: RENAL / URINARY TRACT ULTRASOUND COMPLETE COMPARISON:  CT Abdomen and Pelvis 04/21/2018 CT. FINDINGS: Right Kidney: Renal measurements: 9.9 x 4.6 x 3.8 cm = volume: 91 mL. Echogenic right kidney (image 3) with some renal medulla visible. No hydronephrosis. Several small 10-13 mm cysts appear simple, benign. Left Kidney: Renal measurements: 9.4 x 3.4 x 3.9 cm = volume: 83 mL. Echogenic kidney. Hypoechoic renal medulla visible. Occasional cysts (24 mm at the lower pole). No solid mass identified. Bladder: Appears normal for degree of bladder distention. Other: None. IMPRESSION: Chronic medical renal disease with no superimposed acute renal finding. Electronically Signed   By: Genevie Ann [name]   On: 04/25/2021 10:10     Alen Bleacher, MD 04/26/2021, 3:42 AM PGY-1, [place] Intern pager: [phone], text pages welcome

## 2021-04-27 DIAGNOSIS — N186 End stage renal disease: Secondary | ICD-10-CM | POA: Diagnosis not present

## 2021-04-27 DIAGNOSIS — N19 Unspecified kidney failure: Secondary | ICD-10-CM | POA: Diagnosis not present

## 2021-04-27 LAB — CBC
HCT: 27.7 % — ABNORMAL LOW (ref 36.0–46.0)
Hemoglobin: 9 g/dL — ABNORMAL LOW (ref 12.0–15.0)
MCH: 30.7 pg (ref 26.0–34.0)
MCHC: 32.5 g/dL (ref 30.0–36.0)
MCV: 94.5 fL (ref 80.0–100.0)
Platelets: 111 10*3/uL — ABNORMAL LOW (ref 150–400)
RBC: 2.93 MIL/uL — ABNORMAL LOW (ref 3.87–5.11)
RDW: 15.6 % — ABNORMAL HIGH (ref 11.5–15.5)
WBC: 6.5 10*3/uL (ref 4.0–10.5)
nRBC: 0.8 % — ABNORMAL HIGH (ref 0.0–0.2)

## 2021-04-27 LAB — RENAL FUNCTION PANEL
Albumin: 3.1 g/dL — ABNORMAL LOW (ref 3.5–5.0)
Anion gap: 11 (ref 5–15)
BUN: 44 mg/dL — ABNORMAL HIGH (ref 8–23)
CO2: 18 mmol/L — ABNORMAL LOW (ref 22–32)
Calcium: 7.6 mg/dL — ABNORMAL LOW (ref 8.9–10.3)
Chloride: 101 mmol/L (ref 98–111)
Creatinine, Ser: 4.37 mg/dL — ABNORMAL HIGH (ref 0.44–1.00)
GFR, Estimated: 11 mL/min — ABNORMAL LOW (ref >60)
Glucose, Bld: 103 mg/dL — ABNORMAL HIGH (ref 70–99)
Phosphorus: 2.6 mg/dL (ref 2.5–4.6)
Potassium: 3.4 mmol/L — ABNORMAL LOW (ref 3.5–5.1)
Sodium: 130 mmol/L — ABNORMAL LOW (ref 135–145)

## 2021-04-27 LAB — GLUCOSE, CAPILLARY
Glucose-Capillary: 113 mg/dL — ABNORMAL HIGH (ref 70–99)
Glucose-Capillary: 112 mg/dL — ABNORMAL HIGH (ref 70–99)

## 2021-04-27 MED ORDER — CEFAZOLIN SODIUM-DEXTROSE 2-4 GM/100ML-% IV SOLN
2.0000 g | INTRAVENOUS | Status: AC
  Administered 2021-04-28: 2 g via INTRAVENOUS
  Filled 2021-04-27: qty 100

## 2021-04-27 NOTE — Progress Notes (Signed)
   VASCULAR SURGERY ASSESSMENT & PLAN:   END-STAGE RENAL DISEASE: She has a functioning dialysis catheter.  She is scheduled for placement of a right arm AV fistula or AV graft tomorrow.  Based on her vein map she will likely require a AV graft.  I have again discussed the procedure with her and all of her questions were answered.  I have written preop orders.  SUBJECTIVE:   No specific complaints this morning.  PHYSICAL EXAM:   Vitals:   04/26/21 0739 04/26/21 1000 04/26/21 1628 04/26/21 2153  BP: 116/63 107/67 130/69 (!) 106/58  Pulse: 77 78 91 87  Resp:  20 18 18   Temp:  98.6 F (37 C) 98.4 F (36.9 C) 99 F (37.2 C)  TempSrc:  Oral Oral Oral  SpO2:  95% 97% 96%  Weight:      Height:       Palpable right radial pulse  LABS:   Lab Results  Component Value Date   WBC 6.5 04/27/2021   HGB 9.0 (L) 04/27/2021   HCT 27.7 (L) 04/27/2021   MCV 94.5 04/27/2021   PLT 111 (L) 04/27/2021   Lab Results  Component Value Date   CREATININE 4.37 (H) 04/27/2021   No results found for: INR, PROTIME CBG (last 3)  Recent Labs    04/26/21 1626 04/26/21 2153 04/27/21 0648  GLUCAP 117* 127* 113*    PROBLEM LIST:    Active Problems:   Anxiety   Renal failure (ARF), acute on chronic (HCC)   Renal failure   CKD (chronic kidney disease) stage 5, GFR less than 15 ml/min (HCC)   CURRENT MEDS:    sodium chloride   Intravenous Once   atorvastatin  40 mg Oral Daily   carvedilol  6.25 mg Oral BID WC   Chlorhexidine Gluconate Cloth  6 each Topical Q0600   clopidogrel  75 mg Oral Daily   darbepoetin (ARANESP) injection - DIALYSIS  40 mcg Intravenous Q Fri-HD   heparin  5,000 Units Subcutaneous Q8H   Mesalamine  800 mg Oral TID   mirtazapine  15 mg Oral QHS   pregabalin  25 mg Oral BID   sodium bicarbonate  1,300 mg Oral BID    Deitra Mayo Office: [phone] 04/27/2021

## 2021-04-27 NOTE — Progress Notes (Signed)
Family Medicine Teaching Service Daily Progress Note Intern Pager: [phone]  Patient name: [name] [name] Medical record number: [id number] Date of birth: [birth year] Age: 70 y.o. Gender: female  Primary Care Provider: Zenia Resides, MD Consultants: Nephrology, vascular Code Status: FULL  Pt Overview and Major Events to Date:  10/20: admitted 10/21: tunnel cath placed, first HD session  Assessment and Plan:  [name] [name] is a 70 y.o. female admitted for acute on chronic kidney failure. PMH significant for CKD stage V (now ESRD), Crohn's disease, anemia of chronic disease, COPD, CAD, HFrEF, HTN, HLD and tobacco use.  ESRD  Chronic Metabolic Acidosis Patient received first HD session on 10/21. Cr 4.37 this morning with BUN 44, CO2 19, K 3.4, phos 2.6. -Nephro following, appreciate their care -Vascular following, appreciate their care -Plan for AV fistula/graft placement tomorrow -Next HD tomorrow, 10/24  Crohn's Disease s/p colectomy and ileostomy Colostomy pouch with liquid brown stool this morning, stoma site is well-appearing. -Change colostomy pouch daily -Boost supplements QID  Anemia of CKD  Thrombocytopenia Hgb stable at 9.0 this morning. Platelets 111 (down from 152 on admission). -Continue ESA and Fe per nephro -Monitor CBC  HTN BP has been within normal limits over past 24h but on the lower side. Most recent 107/61. Home meds: hydralazine 103m TID, Carvedilol 6.223mBID, and Bidil 20-37.44m88mID. -Continue home carvedilol -Holding home hydralazine and bidil  CAD  HLD Chronic, stable -Continue home Lipitor 24m42mily and Plavix 744mg28mly  Chronic HFrEF Stable, no evidence of acute exacerbation. -Continue home carvedilol -Holding home bidil for now  Remainder of problems (COPD, tobacco use, anxiety, depression, and peripheral neuropathy) are chronic and stable. Plan per H&P.  FEN/GI: Renal diet, will be NPO @ [facility] for AV fistula/graft placement PPx:  Heparin Dispo:Home  pending further vascular/nephro intervention , will need HD chair set up.   Subjective:  No acute events overnight. Patient denies complaints this morning other than feeling somewhat tired. Reports she's been sleeping during the day but not sleeping well at night.  Objective: Temp:  [98.4 F (36.9 C)-99 F (37.2 C)] 99 F (37.2 C) (10/22 2153) Pulse Rate:  [77-91] 87 (10/22 2153) Resp:  [18-20] 18 (10/22 2153) BP: (106-130)/(58-69) 106/58 (10/22 2153) SpO2:  [95 %-97 %] 96 % (10/22 2153) Physical Exam: General: alert, resting comfortably, NAD Cardiovascular: RRR, normal S1/S2 Respiratory: normal effort, lungs CTAB Abdomen: soft, nontender. Ostomy bag with liquid light brown stool output Extremities: no peripheral edema  Laboratory: Recent Labs  Lab 04/25/21 0421 04/25/21 1211 04/26/21 0407 04/27/21 0201  WBC 4.6  --  6.5 6.5  HGB 7.9* 7.5* 9.2* 9.0*  HCT 24.4* 23.3* 27.1* 27.7*  PLT 141*  --  121* 111*   Recent Labs  Lab 04/24/21 1604 04/25/21 0007 04/25/21 0421 04/25/21 0749 04/25/21 1924 04/26/21 0407 04/27/21 0201  NA 133*   < > 135   < > 133* 134* 130*  K 5.1   < > 4.2   < > 3.9 3.0* 3.4*  CL 110  --  112*   < > 111 103 101  CO2 12*  --  14*   < > 13* 22 18*  BUN 84*  --  84*   < > 80* 32* 44*  CREATININE 5.46*  --  5.56*   < > 5.35* 2.85* 4.37*  CALCIUM 9.0  --  8.6*   < > 7.8* 8.1* 7.6*  PROT 7.6  --  7.4  --   --   --   --  BILITOT 0.8  --  0.7  --   --   --   --   ALKPHOS 92  --  85  --   --   --   --   ALT 21  --  22  --   --   --   --   AST 20  --  22  --   --   --   --   GLUCOSE 112*  --  176*   < > 137* 95 103*   < > = values in this interval not displayed.     Imaging/Diagnostic Tests: VAS [place] UPPER EXT VEIN MAPPING (PRE-OP AVF)  Result Date: 04/26/2021 UPPER EXTREMITY VEIN MAPPING Patient Name:  [name] [name]  Date of Exam:   04/26/2021 Medical Rec #: [id number]    Accession #:    [id number] Date of Birth: Feb 26, 1951    Patient Gender: F Patient Age:   71 years Exam Location:  [facility] Procedure:      VAS [place] UPPER EXT VEIN MAPPING (PRE-OP AVF) Referring Phys: Karoline Caldwell --------------------------------------------------------------------------------  Indications: Pre-access. History: ESRD.  Limitations: Size and depth of vessels. Patient began feeling intense nausea              during examination. Comparison Study: No prior studies. Performing Technologist: Darlin Coco RDMS, RVT  Examination Guidelines: A complete evaluation includes B-mode imaging, spectral Doppler, color Doppler, and power Doppler as needed of all accessible portions of each vessel. Bilateral testing is considered an integral part of a complete examination. Limited examinations for reoccurring indications may be performed as noted. +-----------------+-------------+----------+------------------------------+ Right Cephalic   Diameter (cm)Depth (cm)           Findings            +-----------------+-------------+----------+------------------------------+ Shoulder             0.24        1.13                                  +-----------------+-------------+----------+------------------------------+ Prox upper arm       0.24        0.71                                  +-----------------+-------------+----------+------------------------------+ Mid upper arm        0.12        0.71             branching            +-----------------+-------------+----------+------------------------------+ Dist upper arm       0.11        0.28                                  +-----------------+-------------+----------+------------------------------+ Antecubital fossa                       Not well visualized, branching +-----------------+-------------+----------+------------------------------+ Prox forearm         0.18        0.29                                   +-----------------+-------------+----------+------------------------------+ Mid forearm  0.15        0.60                                  +-----------------+-------------+----------+------------------------------+ Dist forearm                            Not well visualized, branching +-----------------+-------------+----------+------------------------------+ +-----------------+-------------+----------+-------------------+ Right Basilic    Diameter (cm)Depth (cm)     Findings       +-----------------+-------------+----------+-------------------+ Mid upper arm        0.23                                   +-----------------+-------------+----------+-------------------+ Dist upper arm       0.20                                   +-----------------+-------------+----------+-------------------+ Antecubital fossa    0.18                                   +-----------------+-------------+----------+-------------------+ Prox forearm         0.13                                   +-----------------+-------------+----------+-------------------+ Mid forearm          0.10                    branching      +-----------------+-------------+----------+-------------------+ Distal forearm                          Not well visualized +-----------------+-------------+----------+-------------------+ +-----------------+-------------+----------+--------+ Left Cephalic    Diameter (cm)Depth (cm)Findings +-----------------+-------------+----------+--------+ Shoulder             0.25        0.72            +-----------------+-------------+----------+--------+ Prox upper arm       0.18        0.24            +-----------------+-------------+----------+--------+ Mid upper arm        0.20        0.51            +-----------------+-------------+----------+--------+ Dist upper arm       0.20        0.20            +-----------------+-------------+----------+--------+  Antecubital fossa    0.19        0.21            +-----------------+-------------+----------+--------+ Prox forearm         0.26        0.50            +-----------------+-------------+----------+--------+ Mid forearm          0.18        0.61            +-----------------+-------------+----------+--------+ Dist forearm         0.16        0.08            +-----------------+-------------+----------+--------+ +-----------------+-------------+----------+---------+  Left Basilic     Diameter (cm)Depth (cm)Findings  +-----------------+-------------+----------+---------+ Antecubital fossa    0.28                         +-----------------+-------------+----------+---------+ Prox forearm         0.20                         +-----------------+-------------+----------+---------+ Mid forearm          0.20               branching +-----------------+-------------+----------+---------+ Distal forearm       0.12                         +-----------------+-------------+----------+---------+ *See table(s) above for measurements and observations.  Diagnosing physician: Deitra Mayo MD Electronically signed by Deitra Mayo MD on 04/26/2021 at 12:32:07 PM.    Final      Alcus Dad, MD 04/27/2021, 7:37 AM PGY-2, [place] Intern pager: [phone], text pages welcome

## 2021-04-27 NOTE — Progress Notes (Signed)
Admit: 04/24/2021 LOS: 2  41F CKD5 presenting with uremia and now ESRD, start iHD  Subjective:  No interval issues For AVG/F tomorrow CLIP in process  10/22 0701 - 10/23 0700 In: 940 [P.O.:940] Out: 500 [Stool:500]  Filed Weights   04/25/21 0259 04/26/21 0029  Weight: 57.2 kg 54.5 kg    Scheduled Meds:  sodium chloride   Intravenous Once   atorvastatin  40 mg Oral Daily   carvedilol  6.25 mg Oral BID WC   Chlorhexidine Gluconate Cloth  6 each Topical Q0600   clopidogrel  75 mg Oral Daily   darbepoetin (ARANESP) injection - DIALYSIS  40 mcg Intravenous Q Fri-HD   heparin  5,000 Units Subcutaneous Q8H   Mesalamine  800 mg Oral TID   mirtazapine  15 mg Oral QHS   pregabalin  25 mg Oral BID   sodium bicarbonate  1,300 mg Oral BID   Continuous Infusions:  [START ON 04/28/2021]  ceFAZolin (ANCEF) IV     ferric gluconate (FERRLECIT) IVPB     PRN Meds:.acetaminophen **OR** acetaminophen, LORazepam, ondansetron (ZOFRAN) IV  Current Labs: reviewed  Results for ALISHA, BACUS "[name]" (MRN [id number]) as of 04/26/2021 08:57  Ref. Range 04/25/2021 12:11  PTH, Intact Latest Ref Range: 15 - 65 pg/mL 125 (H)   Results for Brozek, Joclynn "[name]" (MRN [id number]) as of 04/26/2021 08:57  Ref. Range 04/25/2021 12:11  Saturation Ratios Latest Ref Range: 10.4 - 31.8 % 19  Ferritin Latest Ref Range: 11 - 307 ng/mL 156   Physical Exam:  Blood pressure 107/61, pulse 83, temperature 98.1 F (36.7 C), temperature source Oral, resp. rate 18, height 5' 6"  (1.676 m), weight 54.5 kg, SpO2 100 %. GEN: NAD, chronically ill-appearing ENT: NCAT EYES: EOMI CV: Regular, normal S1 and S2, 3 out of 6 systolic murmur present, no rub PULM: Clear bilaterally ABD: Soft, ostomy bag noted SKIN: No rashes or lesions EXT: No edema  A New ESRD HD start 04/25/21 R IJ TDC placed 10/21 by IR VVS following for AVG/F on 10/24 CLIP in process Next HD 10/24: Tx #2 Chronic metabolic acidosis 2/2 #1 and #3: cont PO  NaHCO3 at this time 2/2 #3 Ileostomy present Anemia of CKD on Fe and ESA CKD-BMD, Ca and P at goal, stop Ca Citrate HTN, BPs stable, Carvedilol CAD sCHF, chronic Hx/o chrons disease COPD  P As above Next HD 10/24: 4K, 250/500, 2.5h, no heparin, max 1L UF Medication Issues; Preferred narcotic agents for pain control are hydromorphone, fentanyl, and methadone. Morphine should not be used.  Baclofen should be avoided Avoid oral sodium phosphate and magnesium citrate based laxatives / bowel preps    Pearson Grippe MD 04/27/2021, 10:42 AM  Recent Labs  Lab 04/25/21 1924 04/26/21 0407 04/27/21 0201  NA 133* 134* 130*  K 3.9 3.0* 3.4*  CL 111 103 101  CO2 13* 22 18*  GLUCOSE 137* 95 103*  BUN 80* 32* 44*  CREATININE 5.35* 2.85* 4.37*  CALCIUM 7.8* 8.1* 7.6*  PHOS 4.4 2.8 2.6    Recent Labs  Lab 04/24/21 1604 04/25/21 0007 04/25/21 0421 04/25/21 1211 04/26/21 0407 04/27/21 0201  WBC 4.8  --  4.6  --  6.5 6.5  NEUTROABS 2.2  --   --   --   --   --   HGB 8.3*   < > 7.9* 7.5* 9.2* 9.0*  HCT 26.1*   < > 24.4* 23.3* 27.1* 27.7*  MCV 99.6  --  98.4  --  90.0  94.5  PLT 152  --  141*  --  121* 111*   < > = values in this interval not displayed.

## 2021-04-27 NOTE — H&P (View-Only) (Signed)
   VASCULAR SURGERY ASSESSMENT & PLAN:   END-STAGE RENAL DISEASE: She has a functioning dialysis catheter.  She is scheduled for placement of a right arm AV fistula or AV graft tomorrow.  Based on her vein map she will likely require a AV graft.  I have again discussed the procedure with her and all of her questions were answered.  I have written preop orders.  SUBJECTIVE:   No specific complaints this morning.  PHYSICAL EXAM:   Vitals:   04/26/21 0739 04/26/21 1000 04/26/21 1628 04/26/21 2153  BP: 116/63 107/67 130/69 (!) 106/58  Pulse: 77 78 91 87  Resp:  20 18 18   Temp:  98.6 F (37 C) 98.4 F (36.9 C) 99 F (37.2 C)  TempSrc:  Oral Oral Oral  SpO2:  95% 97% 96%  Weight:      Height:       Palpable right radial pulse  LABS:   Lab Results  Component Value Date   WBC 6.5 04/27/2021   HGB 9.0 (L) 04/27/2021   HCT 27.7 (L) 04/27/2021   MCV 94.5 04/27/2021   PLT 111 (L) 04/27/2021   Lab Results  Component Value Date   CREATININE 4.37 (H) 04/27/2021   No results found for: INR, PROTIME CBG (last 3)  Recent Labs    04/26/21 1626 04/26/21 2153 04/27/21 0648  GLUCAP 117* 127* 113*    PROBLEM LIST:    Active Problems:   Anxiety   Renal failure (ARF), acute on chronic (HCC)   Renal failure   CKD (chronic kidney disease) stage 5, GFR less than 15 ml/min (HCC)   CURRENT MEDS:    sodium chloride   Intravenous Once   atorvastatin  40 mg Oral Daily   carvedilol  6.25 mg Oral BID WC   Chlorhexidine Gluconate Cloth  6 each Topical Q0600   clopidogrel  75 mg Oral Daily   darbepoetin (ARANESP) injection - DIALYSIS  40 mcg Intravenous Q Fri-HD   heparin  5,000 Units Subcutaneous Q8H   Mesalamine  800 mg Oral TID   mirtazapine  15 mg Oral QHS   pregabalin  25 mg Oral BID   sodium bicarbonate  1,300 mg Oral BID    Deitra Mayo Office: [phone] 04/27/2021

## 2021-04-28 ENCOUNTER — Inpatient Hospital Stay (HOSPITAL_COMMUNITY): Payer: Medicare Other | Admitting: Certified Registered"

## 2021-04-28 ENCOUNTER — Encounter (HOSPITAL_COMMUNITY)
Admission: EM | Disposition: A | Discharge status: Home / Self Care | Payer: Self-pay | Source: Home / Self Care | Attending: Family Medicine

## 2021-04-28 ENCOUNTER — Encounter (HOSPITAL_COMMUNITY): Payer: Self-pay | Admitting: Student

## 2021-04-28 DIAGNOSIS — N186 End stage renal disease: Secondary | ICD-10-CM | POA: Diagnosis not present

## 2021-04-28 DIAGNOSIS — N19 Unspecified kidney failure: Secondary | ICD-10-CM | POA: Diagnosis not present

## 2021-04-28 HISTORY — PX: AV FISTULA PLACEMENT: SHX1204

## 2021-04-28 LAB — POCT I-STAT, CHEM 8
BUN: 44 mg/dL — ABNORMAL HIGH (ref 8–23)
Calcium, Ion: 0.84 mmol/L — CL (ref 1.15–1.40)
Chloride: 102 mmol/L (ref 98–111)
Creatinine, Ser: 5 mg/dL — ABNORMAL HIGH (ref 0.44–1.00)
Glucose, Bld: 88 mg/dL (ref 70–99)
HCT: 32 % — ABNORMAL LOW (ref 36.0–46.0)
Hemoglobin: 10.9 g/dL — ABNORMAL LOW (ref 12.0–15.0)
Potassium: 3.6 mmol/L (ref 3.5–5.1)
Sodium: 134 mmol/L — ABNORMAL LOW (ref 135–145)
TCO2: 21 mmol/L — ABNORMAL LOW (ref 22–32)

## 2021-04-28 LAB — RENAL FUNCTION PANEL
Albumin: 3 g/dL — ABNORMAL LOW (ref 3.5–5.0)
Anion gap: 12 (ref 5–15)
BUN: 56 mg/dL — ABNORMAL HIGH (ref 8–23)
CO2: 17 mmol/L — ABNORMAL LOW (ref 22–32)
Calcium: 7.5 mg/dL — ABNORMAL LOW (ref 8.9–10.3)
Chloride: 103 mmol/L (ref 98–111)
Creatinine, Ser: 5.98 mg/dL — ABNORMAL HIGH (ref 0.44–1.00)
GFR, Estimated: 7 mL/min — ABNORMAL LOW (ref >60)
Glucose, Bld: 100 mg/dL — ABNORMAL HIGH (ref 70–99)
Phosphorus: 1.8 mg/dL — ABNORMAL LOW (ref 2.5–4.6)
Potassium: 3.5 mmol/L (ref 3.5–5.1)
Sodium: 132 mmol/L — ABNORMAL LOW (ref 135–145)

## 2021-04-28 LAB — CBC
HCT: 27.6 % — ABNORMAL LOW (ref 36.0–46.0)
Hemoglobin: 9.3 g/dL — ABNORMAL LOW (ref 12.0–15.0)
MCH: 31.4 pg (ref 26.0–34.0)
MCHC: 33.7 g/dL (ref 30.0–36.0)
MCV: 93.2 fL (ref 80.0–100.0)
Platelets: 105 10*3/uL — ABNORMAL LOW (ref 150–400)
RBC: 2.96 MIL/uL — ABNORMAL LOW (ref 3.87–5.11)
RDW: 15.6 % — ABNORMAL HIGH (ref 11.5–15.5)
WBC: 7.2 10*3/uL (ref 4.0–10.5)
nRBC: 0.8 % — ABNORMAL HIGH (ref 0.0–0.2)

## 2021-04-28 LAB — SURGICAL PCR SCREEN
MRSA, PCR: NEGATIVE
Staphylococcus aureus: POSITIVE — AB

## 2021-04-28 LAB — GLUCOSE, CAPILLARY: Glucose-Capillary: 103 mg/dL — ABNORMAL HIGH (ref 70–99)

## 2021-04-28 SURGERY — ARTERIOVENOUS (AV) FISTULA CREATION
Anesthesia: Regional | Site: Arm Upper | Laterality: Right

## 2021-04-28 MED ORDER — CHLORHEXIDINE GLUCONATE CLOTH 2 % EX PADS
6.0000 | MEDICATED_PAD | Freq: Every day | CUTANEOUS | Status: DC
  Administered 2021-04-29: 6 via TOPICAL

## 2021-04-28 MED ORDER — HEPARIN 6000 UNIT IRRIGATION SOLUTION
Status: DC | PRN
  Administered 2021-04-28: 1

## 2021-04-28 MED ORDER — 0.9 % SODIUM CHLORIDE (POUR BTL) OPTIME
TOPICAL | Status: DC | PRN
  Administered 2021-04-28: 1000 mL

## 2021-04-28 MED ORDER — MIDAZOLAM HCL 2 MG/2ML IJ SOLN
INTRAMUSCULAR | Status: AC
  Administered 2021-04-28: 1 mg via INTRAVENOUS
  Filled 2021-04-28: qty 2

## 2021-04-28 MED ORDER — PROPOFOL 10 MG/ML IV BOLUS
INTRAVENOUS | Status: AC
  Filled 2021-04-28: qty 20

## 2021-04-28 MED ORDER — FENTANYL CITRATE (PF) 250 MCG/5ML IJ SOLN
INTRAMUSCULAR | Status: AC
  Filled 2021-04-28: qty 5

## 2021-04-28 MED ORDER — MEPERIDINE HCL 25 MG/ML IJ SOLN: 6.2500 mg | INTRAMUSCULAR | Status: DC | PRN

## 2021-04-28 MED ORDER — LIDOCAINE-EPINEPHRINE (PF) 1 %-1:200000 IJ SOLN
INTRAMUSCULAR | Status: AC
  Filled 2021-04-28: qty 30

## 2021-04-28 MED ORDER — TRAMADOL HCL 50 MG PO TABS
50.0000 mg | ORAL_TABLET | Freq: Four times a day (QID) | ORAL | Status: DC | PRN
  Administered 2021-04-28 (×2): 50 mg via ORAL
  Filled 2021-04-28 (×2): qty 1

## 2021-04-28 MED ORDER — BACITRACIN ZINC 500 UNIT/GM EX OINT
TOPICAL_OINTMENT | Freq: Two times a day (BID) | CUTANEOUS | Status: DC
  Filled 2021-04-28: qty 28.4

## 2021-04-28 MED ORDER — LIDOCAINE HCL (PF) 1 % IJ SOLN
INTRAMUSCULAR | Status: AC
  Filled 2021-04-28: qty 30

## 2021-04-28 MED ORDER — HEPARIN SODIUM (PORCINE) 1000 UNIT/ML IJ SOLN
INTRAMUSCULAR | Status: DC | PRN
  Administered 2021-04-28: 5000 [IU] via INTRAVENOUS

## 2021-04-28 MED ORDER — PROTAMINE SULFATE 10 MG/ML IV SOLN
INTRAVENOUS | Status: AC
  Filled 2021-04-28: qty 5

## 2021-04-28 MED ORDER — PHENYLEPHRINE HCL-NACL 20-0.9 MG/250ML-% IV SOLN
INTRAVENOUS | Status: DC | PRN
  Administered 2021-04-28: 50 ug/min via INTRAVENOUS

## 2021-04-28 MED ORDER — MIDAZOLAM HCL 2 MG/2ML IJ SOLN
INTRAMUSCULAR | Status: DC | PRN
  Administered 2021-04-28: 1 mg via INTRAVENOUS

## 2021-04-28 MED ORDER — OXYCODONE HCL 5 MG PO TABS: 5.0000 mg | ORAL_TABLET | Freq: Once | ORAL | Status: DC | PRN

## 2021-04-28 MED ORDER — MIDAZOLAM HCL 2 MG/2ML IJ SOLN: 0.5000 mg | Freq: Once | INTRAMUSCULAR | Status: DC | PRN

## 2021-04-28 MED ORDER — PROMETHAZINE HCL 25 MG/ML IJ SOLN: 6.2500 mg | INTRAMUSCULAR | Status: DC | PRN

## 2021-04-28 MED ORDER — MIDAZOLAM HCL 2 MG/2ML IJ SOLN
INTRAMUSCULAR | Status: AC
  Filled 2021-04-28: qty 2

## 2021-04-28 MED ORDER — PROPOFOL 500 MG/50ML IV EMUL
INTRAVENOUS | Status: DC | PRN
  Administered 2021-04-28: 25 ug/kg/min via INTRAVENOUS

## 2021-04-28 MED ORDER — FENTANYL CITRATE (PF) 100 MCG/2ML IJ SOLN: 50.0000 ug | Freq: Once | INTRAMUSCULAR | Status: AC

## 2021-04-28 MED ORDER — PHENYLEPHRINE 40 MCG/ML (10ML) SYRINGE FOR IV PUSH (FOR BLOOD PRESSURE SUPPORT)
PREFILLED_SYRINGE | INTRAVENOUS | Status: DC | PRN
Start: 2021-04-28 — End: 2021-04-28
  Administered 2021-04-28: 80 ug via INTRAVENOUS
  Administered 2021-04-28 (×2): 120 ug via INTRAVENOUS
  Administered 2021-04-28: 160 ug via INTRAVENOUS
  Administered 2021-04-28: 80 ug via INTRAVENOUS

## 2021-04-28 MED ORDER — PROTAMINE SULFATE 10 MG/ML IV SOLN
INTRAVENOUS | Status: DC | PRN
  Administered 2021-04-28: 30 mg via INTRAVENOUS

## 2021-04-28 MED ORDER — EPHEDRINE 5 MG/ML INJ
INTRAVENOUS | Status: AC
  Filled 2021-04-28: qty 5

## 2021-04-28 MED ORDER — OXYCODONE HCL 5 MG/5ML PO SOLN: 5.0000 mg | Freq: Once | ORAL | Status: DC | PRN

## 2021-04-28 MED ORDER — PHENYLEPHRINE 40 MCG/ML (10ML) SYRINGE FOR IV PUSH (FOR BLOOD PRESSURE SUPPORT)
PREFILLED_SYRINGE | INTRAVENOUS | Status: AC
  Filled 2021-04-28: qty 10

## 2021-04-28 MED ORDER — ORAL CARE MOUTH RINSE: 15.0000 mL | Freq: Once | OROMUCOSAL | Status: AC

## 2021-04-28 MED ORDER — LIDOCAINE 2% (20 MG/ML) 5 ML SYRINGE
INTRAMUSCULAR | Status: AC
  Filled 2021-04-28: qty 5

## 2021-04-28 MED ORDER — HEPARIN SODIUM (PORCINE) 1000 UNIT/ML IJ SOLN
INTRAMUSCULAR | Status: AC
  Filled 2021-04-28: qty 1

## 2021-04-28 MED ORDER — CHLORHEXIDINE GLUCONATE 0.12 % MT SOLN
OROMUCOSAL | Status: AC
  Administered 2021-04-28: 15 mL
  Filled 2021-04-28: qty 15

## 2021-04-28 MED ORDER — LIDOCAINE-EPINEPHRINE (PF) 1 %-1:200000 IJ SOLN
INTRAMUSCULAR | Status: DC | PRN
  Administered 2021-04-28: 30 mL

## 2021-04-28 MED ORDER — MUPIROCIN 2 % EX OINT
1.0000 "application " | TOPICAL_OINTMENT | Freq: Two times a day (BID) | CUTANEOUS | Status: DC
  Administered 2021-04-28 – 2021-04-29 (×2): 1 via NASAL
  Filled 2021-04-28 (×4): qty 22

## 2021-04-28 MED ORDER — FENTANYL CITRATE (PF) 100 MCG/2ML IJ SOLN: 25.0000 ug | INTRAMUSCULAR | Status: DC | PRN

## 2021-04-28 MED ORDER — MIDAZOLAM HCL 2 MG/2ML IJ SOLN: 1.0000 mg | Freq: Once | INTRAMUSCULAR | Status: AC

## 2021-04-28 MED ORDER — FENTANYL CITRATE (PF) 100 MCG/2ML IJ SOLN
INTRAMUSCULAR | Status: AC
  Administered 2021-04-28: 50 ug via INTRAVENOUS
  Filled 2021-04-28: qty 2

## 2021-04-28 MED ORDER — PROPOFOL 10 MG/ML IV BOLUS
INTRAVENOUS | Status: DC | PRN
  Administered 2021-04-28: 20 mg via INTRAVENOUS

## 2021-04-28 MED ORDER — CHLORHEXIDINE GLUCONATE 0.12 % MT SOLN: 15.0000 mL | Freq: Once | OROMUCOSAL | Status: AC

## 2021-04-28 MED ORDER — LIDOCAINE-EPINEPHRINE (PF) 2 %-1:200000 IJ SOLN
INTRAMUSCULAR | Status: DC | PRN
  Administered 2021-04-28: 25 mL via PERINEURAL

## 2021-04-28 MED ORDER — SODIUM CHLORIDE 0.9 % IV SOLN: INTRAVENOUS | Status: DC

## 2021-04-28 MED ORDER — HEPARIN 6000 UNIT IRRIGATION SOLUTION
Status: AC
  Filled 2021-04-28: qty 500

## 2021-04-28 MED ORDER — PAPAVERINE HCL 30 MG/ML IJ SOLN
INTRAMUSCULAR | Status: AC
  Filled 2021-04-28: qty 2

## 2021-04-28 MED ORDER — PAPAVERINE HCL 30 MG/ML IJ SOLN
INTRAMUSCULAR | Status: DC | PRN
  Administered 2021-04-28: 60 mg

## 2021-04-28 SURGICAL SUPPLY — 36 items
ADH SKN CLS APL DERMABOND .7 (GAUZE/BANDAGES/DRESSINGS) ×1
ARMBAND PINK RESTRICT EXTREMIT (MISCELLANEOUS) ×3 IMPLANT
BAG COUNTER SPONGE SURGICOUNT (BAG) ×2 IMPLANT
BAG SPNG CNTER NS LX DISP (BAG) ×1
CANISTER SUCT 3000ML PPV (MISCELLANEOUS) ×2 IMPLANT
CANNULA VESSEL 3MM 2 BLNT TIP (CANNULA) ×3 IMPLANT
CLIP VESOCCLUDE MED 6/CT (CLIP) ×2 IMPLANT
CLIP VESOCCLUDE SM WIDE 6/CT (CLIP) ×2 IMPLANT
COVER PROBE W GEL 5X96 (DRAPES) IMPLANT
DERMABOND ADVANCED (GAUZE/BANDAGES/DRESSINGS) ×1
DERMABOND ADVANCED .7 DNX12 (GAUZE/BANDAGES/DRESSINGS) ×1 IMPLANT
ELECT REM PT RETURN 9FT ADLT (ELECTROSURGICAL) ×2
ELECTRODE REM PT RTRN 9FT ADLT (ELECTROSURGICAL) ×1 IMPLANT
GLOVE SURG ENC MOIS LTX SZ7.5 (GLOVE) ×2 IMPLANT
GLOVE SURG POLY ORTHO LF SZ7.5 (GLOVE) IMPLANT
GLOVE SURG POLYISO LF SZ6.5 (GLOVE) ×1 IMPLANT
GLOVE SURG UNDER LTX SZ8 (GLOVE) ×2 IMPLANT
GOWN STRL REUS W/ TWL LRG LVL3 (GOWN DISPOSABLE) ×3 IMPLANT
GOWN STRL REUS W/TWL LRG LVL3 (GOWN DISPOSABLE) ×6
GRAFT GORETEX STRT 4-7X45 (Vascular Products) ×1 IMPLANT
KIT BASIN OR (CUSTOM PROCEDURE TRAY) ×2 IMPLANT
KIT TURNOVER KIT B (KITS) ×2 IMPLANT
NEEDLE 22X1 1/2 (OR ONLY) (NEEDLE) ×1 IMPLANT
NS IRRIG 1000ML POUR BTL (IV SOLUTION) ×2 IMPLANT
PACK CV ACCESS (CUSTOM PROCEDURE TRAY) ×2 IMPLANT
PAD ARMBOARD 7.5X6 YLW CONV (MISCELLANEOUS) ×4 IMPLANT
SPONGE SURGIFOAM ABS GEL 100 (HEMOSTASIS) IMPLANT
SUT MNCRL AB 4-0 PS2 18 (SUTURE) ×3 IMPLANT
SUT PROLENE 6 0 BV (SUTURE) ×4 IMPLANT
SUT VIC AB 3-0 SH 27 (SUTURE) ×4
SUT VIC AB 3-0 SH 27X BRD (SUTURE) ×1 IMPLANT
SYR 20ML LL LF (SYRINGE) ×1 IMPLANT
SYR 3ML LL SCALE MARK (SYRINGE) ×1 IMPLANT
TOWEL GREEN STERILE (TOWEL DISPOSABLE) ×2 IMPLANT
UNDERPAD 30X36 HEAVY ABSORB (UNDERPADS AND DIAPERS) ×2 IMPLANT
WATER STERILE IRR 1000ML POUR (IV SOLUTION) ×2 IMPLANT

## 2021-04-28 NOTE — Interval H&P Note (Signed)
History and Physical Interval Note:  04/28/2021 8:59 AM  [name]  has presented today for surgery, with the diagnosis of CKD V.  The various methods of treatment have been discussed with the patient and family. After consideration of risks, benefits and other options for treatment, the patient has consented to  Procedure(s): RIGHT ARTERIOVENOUS (AV) FISTULA VERSUS ARTERIOVENOUS GRAFT CREATION (Right) as a surgical intervention.  The patient's history has been reviewed, patient examined, no change in status, stable for surgery.  I have reviewed the patient's chart and labs.  Questions were answered to the patient's satisfaction.     Deitra Mayo

## 2021-04-28 NOTE — Anesthesia Postprocedure Evaluation (Signed)
Anesthesia Post Note  Patient: [name] [name]  Procedure(s) Performed: RIGHT ARTERIOVENOUS (AV) GRAFT INSERTION USING 4-7MM x 45CM GORE-TEX GRAFT (Right: Arm Upper)     Patient location during evaluation: PACU Anesthesia Type: Regional Level of consciousness: awake and alert, patient cooperative and oriented Pain management: pain level controlled Vital Signs Assessment: post-procedure vital signs reviewed and stable Respiratory status: spontaneous breathing, nonlabored ventilation and respiratory function stable Cardiovascular status: stable and blood pressure returned to baseline Postop Assessment: no apparent nausea or vomiting Anesthetic complications: no   No notable events documented.  Last Vitals:  Vitals:   04/28/21 1138 04/28/21 1153  BP: 112/67 (!) 110/58  Pulse: 92 91  Resp: 14 16  Temp:  36.8 C  SpO2: 99% 93%    Last Pain:  Vitals:   04/28/21 1200  TempSrc:   PainSc: 0-No pain                 Lynix Bonine,E. Donnis Pecha

## 2021-04-28 NOTE — Anesthesia Procedure Notes (Signed)
Anesthesia Regional Block: Interscalene brachial plexus block   Pre-Anesthetic Checklist: , timeout performed,  Correct Patient, Correct Site, Correct Laterality,  Correct Procedure, Correct Position, site marked,  Risks and benefits discussed,  Surgical consent,  Pre-op evaluation,  At surgeon's request and post-op pain management  Laterality: Right and Upper  Prep: chloraprep       Needles:   Needle Type: Echogenic Needle     Needle Length: 9cm  Needle Gauge: 21     Additional Needles:   Procedures:,,,, ultrasound used (permanent image in chart),,    Narrative:  Start time: 04/28/2021 9:13 AM End time: 04/28/2021 9:20 AM Injection made incrementally with aspirations every 5 mL.  Performed by: Personally  Anesthesiologist: Annye Asa, MD  Additional Notes: Pt identified in Holding room.  Monitors applied. Working IV access confirmed. Sterile prep R clavicle and neck.  #21ga ECHOgenic Arrow block needle to interscalene brachial plexus with US guidance.  25cc 2% Lidocaine with 1:200k epi injected incrementally after negative test dose.  Patient asymptomatic, VSS, no heme aspirated, tolerated well.   Jenita Seashore, MD

## 2021-04-28 NOTE — Progress Notes (Signed)
Subjective:  s/p AVG placement as well as second HD treatment today - although her HD got cut a little short this AM b/c the Or was ready for her.  She looks good-  is having a heated discussion with her daughter  Objective Vital signs in last 24 hours: Vitals:   04/28/21 0924 04/28/21 1123 04/28/21 1138 04/28/21 1153  BP: (!) 138/104 104/87 112/67 (!) 110/58  Pulse: 79 93 92 91  Resp: 19 (!) 22 14 16   Temp:  (!) 97.2 F (36.2 C)  98.3 F (36.8 C)  TempSrc:      SpO2: 100% 98% 99% 93%  Weight:      Height:       Weight change:   Intake/Output Summary (Last 24 hours) at 04/28/2021 1359 Last data filed at 04/28/2021 1117 Gross per 24 hour  Intake 740 ml  Output 108 ml  Net 632 ml    Assessment/ Plan: Pt is a 70 y.o. yo female with stage 5 CKD who was admitted on 04/24/2021 with uremia-  requiring the start of HD  Assessment/Plan: 1. Uremia-  said was tired and with decreased appetite prior to admission.  Seems to have accepted her fate 2. ESRD  -  first treatment via [facility] on 10/21-   second treatment today but got cut short-  S/p AVG today per VVS-  OP HD placement pending-  lives close to [facility].  Have just heard that she will be TTS at [facility]-  therefore-  I will arrange for her third HD here tomorrow AM-   she then could be discharged to home to start at OP unit on Thursday 3. Anemia-  most recent hgb 10.9-  giving iv iron and low dose ESA 4. Secondary hyperparathyroidism- phos was 1.8-  calcium 7.5-  no binder-  will institute protocols as OP unit  5. HTN/volume-  mild edema-  only on coreg-  minimal UF with HD  6. Acidosis-  I will stop the sodium bicarb  Louis Meckel    Labs: Basic Metabolic Panel: Recent Labs  Lab 04/26/21 0407 04/27/21 0201 04/28/21 0452 04/28/21 0905  NA 134* 130* 132* 134*  K 3.0* 3.4* 3.5 3.6  CL 103 101 103 102  CO2 22 18* 17*  --   GLUCOSE 95 103* 100* 88  BUN 32* 44* 56* 44*  CREATININE 2.85* 4.37* 5.98* 5.00*  CALCIUM 8.1* 7.6*  7.5*  --   PHOS 2.8 2.6 1.8*  --    Liver Function Tests: Recent Labs  Lab 04/24/21 1604 04/25/21 0421 04/25/21 0749 04/26/21 0407 04/27/21 0201 04/28/21 0452  AST 20 22  --   --   --   --   ALT 21 22  --   --   --   --   ALKPHOS 92 85  --   --   --   --   BILITOT 0.8 0.7  --   --   --   --   PROT 7.6 7.4  --   --   --   --   ALBUMIN 3.7 3.5   < > 3.3* 3.1* 3.0*   < > = values in this interval not displayed.   No results for input(s): LIPASE, AMYLASE in the last 168 hours. No results for input(s): AMMONIA in the last 168 hours. CBC: Recent Labs  Lab 04/24/21 1604 04/25/21 0007 04/25/21 0421 04/25/21 1211 04/26/21 0407 04/27/21 0201 04/28/21 0452 04/28/21 0905  WBC 4.8  --  4.6  --  6.5 6.5 7.2  --   NEUTROABS 2.2  --   --   --   --   --   --   --   HGB 8.3*   < > 7.9*   < > 9.2* 9.0* 9.3* 10.9*  HCT 26.1*   < > 24.4*   < > 27.1* 27.7* 27.6* 32.0*  MCV 99.6  --  98.4  --  90.0 94.5 93.2  --   PLT 152  --  141*  --  121* 111* 105*  --    < > = values in this interval not displayed.   Cardiac Enzymes: No results for input(s): CKTOTAL, CKMB, CKMBINDEX, TROPONINI in the last 168 hours. CBG: Recent Labs  Lab 04/26/21 1126 04/26/21 1626 04/26/21 2153 04/27/21 0648 04/27/21 2225  GLUCAP 109* 117* 127* 113* 112*    Iron Studies: No results for input(s): IRON, TIBC, TRANSFERRIN, FERRITIN in the last 72 hours. Studies/Results: No results found. Medications: Infusions:  ferric gluconate (FERRLECIT) IVPB      Scheduled Medications:  sodium chloride   Intravenous Once   atorvastatin  40 mg Oral Daily   carvedilol  6.25 mg Oral BID WC   Chlorhexidine Gluconate Cloth  6 each Topical Q0600   darbepoetin (ARANESP) injection - DIALYSIS  40 mcg Intravenous Q Fri-HD   heparin sodium (porcine)       Mesalamine  800 mg Oral TID   mirtazapine  15 mg Oral QHS   mupirocin ointment  1 application Nasal BID   pregabalin  25 mg Oral BID   sodium bicarbonate  1,300 mg Oral  BID    have reviewed scheduled and prn medications.  Physical Exam: General:  NAD-  arm is still under the impact of anesthesia Heart: RRR Lungs: mostly clear Abdomen: soft, non tender Extremities: trace edema Dialysis Access: TDC and right upper arm AVG-  good thrill and bruit    04/28/2021,1:59 PM  LOS: 3 days

## 2021-04-28 NOTE — Progress Notes (Signed)
Tele monitor tech calling about patient. Informed staff that patient has been off the unit since past 7 this morning. Instructed to call dialysis RN where the patient is located for any concerns regarding changes in rate or rhythm.

## 2021-04-28 NOTE — Op Note (Signed)
    NAME: [name]    MRN: [id number] DOB: 1951-01-06    DATE OF OPERATION: 04/28/2021  PREOP DIAGNOSIS:    End-stage renal disease  POSTOP DIAGNOSIS:    Same  PROCEDURE:    New right upper arm AV graft (4-7 mm PTFE graft)  SURGEON: Judeth Cornfield. Scot Dock, MD  ASSIST: None  ANESTHESIA: Scalene block  EBL: Minimal  INDICATIONS:    [name] [name] is a 70 y.o. female who presents for new access.  She has not functioning catheter and just began dialysis.  FINDINGS:   3 mm brachial artery.  4 mm brachial vein  TECHNIQUE:   The patient was taken to the operating room after a scalene block was placed.  The right arm was prepped and draped in the usual sterile fashion.  I looked with the SonoSite at the veins and I did not see usable vein for a fistula.  A longitudinal incision was made over the brachial artery and the artery was dissected free.  It was a small artery about 3 mm.  Separate longitudinal incision was made below the axilla as the adjacent brachial veins at the antecubital level were small.  The high brachial vein was dissected free.  It was about 4 mm and the vein was very thin.  A 4-7 mm PTFE graft was tunneled in the upper arm and the patient was then heparinized.  The brachial artery was clamped proximally and distally and a longitudinal arteriotomy was made.  Only a short segment of the 4 mm into the graft was excised, the graft spatulated and sewn end-to-side to the brachial artery using continuous 6-0 Prolene suture.  The graft sent for the appropriate length for anastomosis to the high brachial vein.  The vein was ligated distally and spatulated proximally.  The graft was cut to the appropriate length, spatulated, and sewn into end to the vein with 2 continuous 6-0 Prolene sutures.  At the completion there was an excellent thrill in the graft.  There was a radial and ulnar signal with the Doppler.  The heparin was partially reversed with protamine.  Each of the wounds  was closed with a deep layer of 3-0 Vicryl and the skin closed with 4-0 Monocryl.  Dermabond was applied.  The patient tolerated the procedure well and was transferred to recovery room in stable condition.  All needle and sponge counts were correct.    Deitra Mayo, MD, FACS [facility]  DATE OF DICTATION:   04/28/2021

## 2021-04-28 NOTE — Transfer of Care (Signed)
Immediate Anesthesia Transfer of Care Note  Patient: [name] [name]  Procedure(s) Performed: RIGHT ARTERIOVENOUS (AV) GRAFT INSERTION USING 4-7MM x 45CM GORE-TEX GRAFT (Right: Arm Upper)  Patient Location: PACU  Anesthesia Type:MAC combined with regional for post-op pain  Level of Consciousness: drowsy and patient cooperative  Airway & Oxygen Therapy: Patient Spontanous Breathing  Post-op Assessment: Report given to RN and Post -op Vital signs reviewed and stable  Post vital signs: Reviewed and stable  Last Vitals:  Vitals Value Taken Time  BP 104/87 04/28/21 1122  Temp    Pulse 91 04/28/21 1123  Resp 21 04/28/21 1123  SpO2 97 % 04/28/21 1123  Vitals shown include unvalidated device data.  Last Pain:  Vitals:   04/28/21 0848  TempSrc:   PainSc: 0-No pain         Complications: No notable events documented.

## 2021-04-28 NOTE — Plan of Care (Signed)
  Problem: Fluid Volume: Goal: Compliance with measures to maintain balanced fluid volume will improve Outcome: Progressing   Problem: Nutritional: Goal: Ability to make healthy dietary choices will improve Outcome: Progressing   Problem: Education: Goal: Knowledge of General Education information will improve Description: Including pain rating scale, medication(s)/side effects and non-pharmacologic comfort measures Outcome: Progressing   Problem: Coping: Goal: Level of anxiety will decrease Outcome: Progressing

## 2021-04-28 NOTE — Progress Notes (Signed)
FPTS Interim Progress Note  S: patient found resting comfortably.   O: BP (!) 109/58 (BP Location: Right Arm)   Pulse 88   Temp 99.8 F (37.7 C) (Oral)   Resp 18   Ht 5' 6"  (1.676 m) Comment: Simultaneous filing. User may not have seen previous data.  Wt 120 lb 2.4 oz (54.5 kg)   SpO2 100%   BMI 19.39 kg/m    Physical exam General: NAD, resting peacefully Pulmonary: unlabored respirations  A/P: No changes, remainder per day team.   Corky Sox, MD PGY-1 [place] Intern pager: [phone], text pages welcome

## 2021-04-28 NOTE — Anesthesia Preprocedure Evaluation (Addendum)
Anesthesia Evaluation  Patient identified by MRN, date of birth, ID band Patient awake    Reviewed: Allergy & Precautions, NPO status , Patient's Chart, lab work & pertinent test results, reviewed documented beta blocker date and time   History of Anesthesia Complications Negative for: history of anesthetic complications  Airway Mallampati: I  TM Distance: >3 FB Neck ROM: Full    Dental  (+) Edentulous Upper, Dental Advisory Given   Pulmonary Current Smoker and Patient abstained from smoking.,    breath sounds clear to auscultation       Cardiovascular hypertension, Pt. on medications and Pt. on home beta blockers + Peripheral Vascular Disease and +CHF  + Valvular Problems/Murmurs MR  Rhythm:Regular Rate:Normal  '21 stress: The left ventricular ejection fraction is moderately decreased (30-44%).  Nuclear stress EF: 35%.  Defect 1: There is a medium defect of mild severity present in the basal anteroseptal, mid anteroseptal, apical anterior and apex location.  Defect 2: There is a small defect of mild severity present in the basal inferior, mid inferior and apical inferior location.  Findings consistent with ischemia and prior myocardial infarction. This is an intermediate risk study due to moderately decreased systolic function. Small amount of ischemia  '21 ECHO: EF 25-30%. LV has severely decreased function, global hypokinesis. Grade I DD, mitral valve is normal in structure and function, mod MR, No MS. The aortic valve is tricuspid, mild AI, Mild to moderate aortic valve sclerosis/calcification is present, without  any evidence of aortic stenosis   Neuro/Psych  Headaches, Seizures -,  Anxiety    GI/Hepatic Neg liver ROS, GERD  Controlled,Crohn's   Endo/Other  negative endocrine ROS  Renal/GU Renal disease     Musculoskeletal   Abdominal   Peds  Hematology  (+) Blood dyscrasia (Hb 9.3, plt 105k), anemia ,    Anesthesia Other Findings   Reproductive/Obstetrics                            Anesthesia Physical Anesthesia Plan  ASA: 3  Anesthesia Plan: Regional   Post-op Pain Management:    Induction:   PONV Risk Score and Plan: 1  Airway Management Planned: Natural Airway and Simple Face Mask  Additional Equipment: None  Intra-op Plan:   Post-operative Plan:   Informed Consent: I have reviewed the patients History and Physical, chart, labs and discussed the procedure including the risks, benefits and alternatives for the proposed anesthesia with the patient or authorized representative who has indicated his/her understanding and acceptance.     Dental advisory given  Plan Discussed with: CRNA and Surgeon  Anesthesia Plan Comments: (Plan routine monitors, interscalene block )      Anesthesia Quick Evaluation

## 2021-04-28 NOTE — Progress Notes (Addendum)
Additional clinicals faxed to [facility] admissions this am. Will follow and assist.    Melven Sartorius Renal Navigator [phone]   Addendum at 10:43 am: [facility] Admissions regarding update for pt's clinic/schedule. Awaiting response.   Addendum at 3:48pm: Pt has been approved and accepted at [facility] on a TTS schedule. Clinic can start pt on 10/27. Pt will need to arrive at 6:40 for 7:00 chair time. Pt will need to be at the clinic on Wednesday at 3:00 to complete paperwork prior to first treatment on Thursday. Met with pt at bedside to discuss arrangements. Pt provided schedule letter with above arrangements documented. Pt voices understanding and has no further questions at this time. Pt plans to transport self to clinic on Wednesday to complete paperwork and schedule transportation today through [facility] for dialysis treatments starting on Thursday. Will follow and assist.

## 2021-04-28 NOTE — Progress Notes (Addendum)
Family Medicine Teaching Service Daily Progress Note Intern Pager: [phone]  Patient name: [name] [name] Medical record number: [id number] Date of birth: Aug 11, 1950 Age: 70 y.o. Gender: female  Primary Care Provider: Zenia Resides, MD Consultants: Nephrology, vascular Code Status: FULL  Pt Overview and Major Events to Date:  10/20: admitted 10/21: tunnel cath placed, first HD session 10/24: second HD, AV graft/fistula placement  Assessment and Plan: [name] [name] is a 70 y.o. female admitted for acute on chronic kidney failure. PMH significant for CKD stage V (now ESRD), Crohn's disease, anemia of chronic disease, COPD, CAD, HFrEF, HTN, HLD and tobacco use.  Acute on Chronic Renal Failure  ESRD  Chronic Metabolic Acidosis Pt received first HD session on 10/21. Second HD session this morning. Cr 5.98 this AM with BUN 56, CO2 17, K 3.5, phos 1.8. AV graft scheduled for today (10/24). -Nephro following, appreciate their care -Vascular following, appreciate their care -Second HD session this morning -AV graft/fistula placement today. Will resume renal diet after surgery.  Crohn's Disease s/p colectomy and ileostomy Colostomy with light liquid brown stool this AM. Stoma site appears well. -Change colostomy pouch twice daily -Boost supplements QID  Anemia of Chronic Disease  Thrombocytopenia Hgb stable at 9.3 this AM. Platelets 105 this morning, down from 152 on admission.  -Continue ESA and Fe per nephro -Continue to monitor CBC  HTN BP range of 145/79 to 103/69 this morning. Currently stable at 136/69. Home meds: hydralazine 25 mg TID, Carvedilol 6.40m BID, and Bidil 20-37.520mTID.  -continue home carvedilol -holding home hydralazine and bidil  CAD  HLD  Chronic and stable -continue home lipitor 4085maily -holding plavix 75 mg today prior to AV graft, will continue post surgery  Remainder of pt's problems are chronic and stable (COPD, tobacco use, anxiety, depression,  and peripheral neuropathy). Plan per H&P.  FEN/GI: Currently NPO, will resume Renal diet after AV graft/fistula placement today  PPx: SCD Dispo: Home pending further vascular/nephro intervention. Will need HD chair set up. Daughter will temporarily live with pt.   Subjective:  Pt seen at bedside this morning AAOx3. No acute events overnight. Pt denies any complaints this morning. She states she is tired and that she notices she has been urinating more than prior to admission. Pt states she is starting to cope better with the transition to dialysis and doesn't feel as overwhelmed as before.   Objective: Temp:  [98.1 F (36.7 C)-99.8 F (37.7 C)] 98.6 F (37 C) (10/24 0711) Pulse Rate:  [83-88] 83 (10/24 0716) Resp:  [16-18] 16 (10/24 0711) BP: (107-145)/(58-79) 117/69 (10/24 0716) SpO2:  [96 %-100 %] 100 % (10/24 0533) Weight:  [58 kg] 58 kg (10/24 0711) Physical Exam: General: AAOx3, resting comfortably, no acute distress Cardiovascular: RRR, w/o murmurs, normal S1/S2 Respiratory: CTAB, normal effort Abdomen: soft and nontender. Ostomy bag in place with light brown liquid stool output Extremities: no peripheral edema  Laboratory: Recent Labs  Lab 04/26/21 0407 04/27/21 0201 04/28/21 0452  WBC 6.5 6.5 7.2  HGB 9.2* 9.0* 9.3*  HCT 27.1* 27.7* 27.6*  PLT 121* 111* 105*   Recent Labs  Lab 04/24/21 1604 04/25/21 0007 04/25/21 0421 04/25/21 0749 04/26/21 0407 04/27/21 0201 04/28/21 0452  NA 133*   < > 135   < > 134* 130* 132*  K 5.1   < > 4.2   < > 3.0* 3.4* 3.5  CL 110  --  112*   < > 103 101 103  CO2 12*  --  14*   < > 22 18* 17*  BUN 84*  --  84*   < > 32* 44* 56*  CREATININE 5.46*  --  5.56*   < > 2.85* 4.37* 5.98*  CALCIUM 9.0  --  8.6*   < > 8.1* 7.6* 7.5*  PROT 7.6  --  7.4  --   --   --   --   BILITOT 0.8  --  0.7  --   --   --   --   ALKPHOS 92  --  85  --   --   --   --   ALT 21  --  22  --   --   --   --   AST 20  --  22  --   --   --   --   GLUCOSE 112*   --  176*   < > 95 103* 100*   < > = values in this interval not displayed.     Angus Seller, Medical Student 04/28/2021, 7:38 AM MS-4, [place] Intern pager: [phone], text pages welcome   FPTS Upper-Level Resident Addendum   I have independently interviewed and examined the patient. I have discussed the above with the original author and agree with their documentation.  Please see also any attending notes.    Carollee Leitz, MD PGY-3, [facility] 04/28/2021 3:39 PM  FPTS Service pager: [phone] (text pages welcome through [facility])

## 2021-04-29 ENCOUNTER — Encounter (HOSPITAL_COMMUNITY): Payer: Self-pay | Admitting: Vascular Surgery

## 2021-04-29 DIAGNOSIS — Z9889 Other specified postprocedural states: Secondary | ICD-10-CM

## 2021-04-29 DIAGNOSIS — Z95828 Presence of other vascular implants and grafts: Secondary | ICD-10-CM

## 2021-04-29 DIAGNOSIS — N19 Unspecified kidney failure: Secondary | ICD-10-CM | POA: Diagnosis not present

## 2021-04-29 DIAGNOSIS — F419 Anxiety disorder, unspecified: Secondary | ICD-10-CM | POA: Diagnosis not present

## 2021-04-29 LAB — CBC
HCT: 28 % — ABNORMAL LOW (ref 36.0–46.0)
Hemoglobin: 9 g/dL — ABNORMAL LOW (ref 12.0–15.0)
MCH: 31 pg (ref 26.0–34.0)
MCHC: 32.1 g/dL (ref 30.0–36.0)
MCV: 96.6 fL (ref 80.0–100.0)
Platelets: 117 10*3/uL — ABNORMAL LOW (ref 150–400)
RBC: 2.9 MIL/uL — ABNORMAL LOW (ref 3.87–5.11)
RDW: 15.8 % — ABNORMAL HIGH (ref 11.5–15.5)
WBC: 7.9 10*3/uL (ref 4.0–10.5)
nRBC: 1 % — ABNORMAL HIGH (ref 0.0–0.2)

## 2021-04-29 LAB — RENAL FUNCTION PANEL
Albumin: 3.1 g/dL — ABNORMAL LOW (ref 3.5–5.0)
Anion gap: 11 (ref 5–15)
BUN: 46 mg/dL — ABNORMAL HIGH (ref 8–23)
CO2: 17 mmol/L — ABNORMAL LOW (ref 22–32)
Calcium: 7.7 mg/dL — ABNORMAL LOW (ref 8.9–10.3)
Chloride: 102 mmol/L (ref 98–111)
Creatinine, Ser: 5.5 mg/dL — ABNORMAL HIGH (ref 0.44–1.00)
GFR, Estimated: 8 mL/min — ABNORMAL LOW (ref >60)
Glucose, Bld: 93 mg/dL (ref 70–99)
Phosphorus: 2.4 mg/dL — ABNORMAL LOW (ref 2.5–4.6)
Potassium: 3.6 mmol/L (ref 3.5–5.1)
Sodium: 130 mmol/L — ABNORMAL LOW (ref 135–145)

## 2021-04-29 LAB — GLUCOSE, CAPILLARY: Glucose-Capillary: 102 mg/dL — ABNORMAL HIGH (ref 70–99)

## 2021-04-29 MED ORDER — CARVEDILOL 6.25 MG PO TABS: 6.2500 mg | ORAL_TABLET | Freq: Two times a day (BID) | ORAL | 0 refills | Status: DC

## 2021-04-29 MED ORDER — HEPARIN SODIUM (PORCINE) 1000 UNIT/ML IJ SOLN: 3200.0000 [IU] | Freq: Once | INTRAMUSCULAR | Status: AC

## 2021-04-29 MED ORDER — HEPARIN SODIUM (PORCINE) 1000 UNIT/ML IJ SOLN
INTRAMUSCULAR | Status: AC
  Administered 2021-04-29: 3200 [IU] via INTRAVENOUS
  Filled 2021-04-29: qty 4

## 2021-04-29 MED ORDER — PREGABALIN 25 MG PO CAPS: 50.0000 mg | ORAL_CAPSULE | Freq: Every day | ORAL | 0 refills | Status: DC

## 2021-04-29 NOTE — Hospital Course (Addendum)
[name] [name] is a 70 yo female who presented from outpatient nephrology with uremia and abnormal labs admitted for new ESRD and initiation of hemodialysis. PMH significant for CKD stage V (now ESRD), Crohn's disease, anemia of chronic disease, COPD, CAD, HFrEF, HTN, HLD, and tobacco use. Hospital course as outlined below:  Acute on Chronic Renal Failure  ESRD  Chronic Metabolic Acidosis The patient was admitted after her nephrologist had concern for dehydration due to Cr 5.06 and GFR 9 outpatient in the office. Upon arriving to the ED labs revealed hyperkalemia to 5.1, Cr of 5.46, BUN 84, and GFR 8 as well as signs and symptoms of uremia. Hyperkalemia resolved with lokelma x1. VBG revealed metabolic acidosis with pH 7.25, hypocapnic with bicarb of 10.6 and PO2 of 103.0. Received  0.5L bolus IVF and sodium bicarbonate. Nephrology was consulted and they recommended to begin hemodialysis. IR placed [place] on 10/21 and first HD session was morning of 10/21 and second HD occurred in AM of 10/25. Vascular placed an AV graft/fistula on 10/25 in the pt's right upper extremity without complication. On day of discharge (10/25), pt had a BUN of 46, Cr of 5.5, phosphorous 2.4, and albumin 3.1. Outpatient HD scheduled Tuesday/Thursday/Saturday and she will follow up with nephrology.   Crohn's Disease s/p colectomy and ileostomy No complications associated with Crohn's while admitted. Her ostomy site was maintained and cared for while by wound RN. Home mesalamine continued.   Anemia of Chronic Disease  Thrombocytopenia Received 1 u PRBC during hemodialysis due to hemoglobin of 7.5. Hgb 9.0 on discharge. Iron studies, folate, and B12 levels were all WNL.   Follow-up items for PCP Discontinued Plavix which was started for primary prevention in 2012. Consider risks/benefits and indication for continuing medication as there is no documented history of CVA or MI. Discontinued home Hydralazine secondary to decreased BP  with initiation of dialysis.

## 2021-04-29 NOTE — Progress Notes (Signed)
DISCHARGE NOTE HOME [name] [name] to be discharged Home per MD order. Discussed prescriptions and follow up appointments with the patient. Prescriptions given to patient; medication list explained in detail. Patient verbalized understanding.  Skin clean, dry and intact without evidence of skin break down, no evidence of skin tears noted. IV catheter discontinued intact. Site without signs and symptoms of complications. Dressing and pressure applied. Pt denies pain at the site currently. No complaints noted.  Patient free of lines, drains, and wounds.   An After Visit Summary (AVS) was printed and given to the patient. Patient escorted via wheelchair, and discharged home via private auto.  Dolores Hoose, RN

## 2021-04-29 NOTE — Procedures (Signed)
Patient was seen on dialysis and the procedure was supervised.  BFR 300  Via TDC BP is  123/65.   Patient appears to be tolerating treatment well  Louis Meckel 04/29/2021

## 2021-04-29 NOTE — TOC Transition Note (Addendum)
Transition of Care [facility]) - CM/SW Discharge Note   Patient Details  Name: [name] [name] MRN: [id number] Date of Birth: Nov 07, 1950  Transition of Care [facility]) CM/SW Contact:  Tom-Johnson, Renea Ee, RN Phone Number: 04/29/2021, 3:11 PM   Clinical Narrative:    CM spoke with patient about TOC needs at discharge. Patient to start dialysis on Thursday at [facility] and Medicaid transportation will transport to and from dialysis. No recommendations from PT/OT. Denies any needs at this time. Lift transportation scheduled with [place] [phone]) for transport at discharge. [place] waiver signed by patient an witnessed by CM and placed on patient's chart. Nurse and patient notified. No further TOC needs noted.    Final next level of care: Home/Self Care Barriers to Discharge: No Barriers Identified   Patient Goals and CMS Choice Patient states their goals for this hospitalization and ongoing recovery are:: To go home CMS Medicare.gov Compare Post Acute Care list provided to:: Patient Choice offered to / list presented to : NA  Discharge Placement                       Discharge Plan and Services                DME Arranged: N/A DME Agency: NA       HH Arranged: NA HH Agency: NA        Social Determinants of Health (SDOH) Interventions     Readmission Risk Interventions No flowsheet data found.

## 2021-04-29 NOTE — Progress Notes (Signed)
Subjective:  seen on dialysis for 3rd treatment-  no c/o's-  has spot at [facility] and can start on Thursday-  we think the plan is for her to go home today   "my arm hurts"   Objective Vital signs in last 24 hours: Vitals:   04/29/21 0717 04/29/21 0733 04/29/21 0800 04/29/21 0830  BP: 126/89 122/62 111/65 123/65  Pulse: 83 89 92 91  Resp: (!) 29  18 17   Temp: 98.6 F (37 C)     TempSrc: Oral     SpO2: 98%     Weight: 56.6 kg     Height:       Weight change:   Intake/Output Summary (Last 24 hours) at 04/29/2021 0910 Last data filed at 04/28/2021 2109 Gross per 24 hour  Intake 1220 ml  Output 50 ml  Net 1170 ml    Assessment/ Plan: Pt is a 70 y.o. yo female with stage 5 CKD who was admitted on 04/24/2021 with uremia-  requiring the start of HD  Assessment/Plan: 1. Uremia-  said was tired and with decreased appetite prior to admission.  These were likely uremic sxms.  Have started dialysis and she seems to have accepted her fate 2. ESRD  -  first treatment via [facility] on 10/21-   second treatment 10/24 but got cut short-  S/p AVG 10/24 per [facility]-  OP HD placement  TTS at [facility]-  she can be discharged to home to start at OP unit on Thursday 3. Anemia-  most recent hgb 10.9----9.0 after surgery-   giving iv iron and low dose ESA 4. Secondary hyperparathyroidism- phos was 1.8-  calcium 7.5-  no binder-  will institute protocols as OP unit  5. HTN/volume-  mild edema-  only on low dose coreg-  minimal UF with HD  6. Acidosis-   stopped the sodium bicarb  Louis Meckel    Labs: Basic Metabolic Panel: Recent Labs  Lab 04/27/21 0201 04/28/21 0452 04/28/21 0905 04/29/21 0742  NA 130* 132* 134* 130*  K 3.4* 3.5 3.6 3.6  CL 101 103 102 102  CO2 18* 17*  --  17*  GLUCOSE 103* 100* 88 93  BUN 44* 56* 44* 46*  CREATININE 4.37* 5.98* 5.00* 5.50*  CALCIUM 7.6* 7.5*  --  7.7*  PHOS 2.6 1.8*  --  2.4*   Liver Function Tests: Recent Labs  Lab 04/24/21 1604 04/25/21 0421  04/25/21 0749 04/27/21 0201 04/28/21 0452 04/29/21 0742  AST 20 22  --   --   --   --   ALT 21 22  --   --   --   --   ALKPHOS 92 85  --   --   --   --   BILITOT 0.8 0.7  --   --   --   --   PROT 7.6 7.4  --   --   --   --   ALBUMIN 3.7 3.5   < > 3.1* 3.0* 3.1*   < > = values in this interval not displayed.   No results for input(s): LIPASE, AMYLASE in the last 168 hours. No results for input(s): AMMONIA in the last 168 hours. CBC: Recent Labs  Lab 04/24/21 1604 04/25/21 0007 04/25/21 0421 04/25/21 1211 04/26/21 0407 04/27/21 0201 04/28/21 0452 04/28/21 0905 04/29/21 0742  WBC 4.8  --  4.6  --  6.5 6.5 7.2  --  7.9  NEUTROABS 2.2  --   --   --   --   --   --   --   --  HGB 8.3*   < > 7.9*   < > 9.2* 9.0* 9.3* 10.9* 9.0*  HCT 26.1*   < > 24.4*   < > 27.1* 27.7* 27.6* 32.0* 28.0*  MCV 99.6  --  98.4  --  90.0 94.5 93.2  --  96.6  PLT 152  --  141*  --  121* 111* 105*  --  117*   < > = values in this interval not displayed.   Cardiac Enzymes: No results for input(s): CKTOTAL, CKMB, CKMBINDEX, TROPONINI in the last 168 hours. CBG: Recent Labs  Lab 04/26/21 2153 04/27/21 0648 04/27/21 2225 04/28/21 2050 04/29/21 0534  GLUCAP 127* 113* 112* 103* 102*    Iron Studies: No results for input(s): IRON, TIBC, TRANSFERRIN, FERRITIN in the last 72 hours. Studies/Results: No results found. Medications: Infusions:  ferric gluconate (FERRLECIT) IVPB      Scheduled Medications:  sodium chloride   Intravenous Once   atorvastatin  40 mg Oral Daily   bacitracin   Topical BID   carvedilol  6.25 mg Oral BID WC   Chlorhexidine Gluconate Cloth  6 each Topical Q0600   Chlorhexidine Gluconate Cloth  6 each Topical Q0600   darbepoetin (ARANESP) injection - DIALYSIS  40 mcg Intravenous Q Fri-HD   Mesalamine  800 mg Oral TID   mirtazapine  15 mg Oral QHS   mupirocin ointment  1 application Nasal BID   pregabalin  25 mg Oral BID    have reviewed scheduled and prn  medications.  Physical Exam: General:  NAD-  arm is tender Heart: RRR Lungs: mostly clear Abdomen: soft, non tender Extremities: trace edema Dialysis Access: TDC and right upper arm AVG-  good thrill and bruit    04/29/2021,9:10 AM  LOS: 4 days

## 2021-04-29 NOTE — Discharge Instructions (Addendum)
Dear [name],   Thank you for letting us participate in your care! In this section, you will find a brief summary of why you were admitted to the hospital, what happened during your admission, your diagnosis/diagnoses, and recommended follow up.  You were admitted because of a decline in your kidney function You were diagnosed with "end stage renal disease" and started on dialysis. You were seen by the nephrology specialists and the vascular specialists while you were here. You had an AV graft placed, so you can get dialysis on Tuesday/Thursday/Saturday as an outpatient   POST-HOSPITAL & CARE INSTRUCTIONS Your first dialysis will be on 10/27. You need to arrive by 6:40am You need to go to the clinic on Wednesday, 10/26 at 3:00pm to complete paperwork. Please see medications section of this packet for any medication changes.   DOCTOR'S APPOINTMENTS & FOLLOW UP Future Appointments  Date Time Provider [place]  05/19/2021  9:30 AM Zenia Resides, MD [facility]  05/21/2021  3:15 PM [facility]    Thank you for choosing [facility]! Take care and be well!  [facility]  [place], [place] [address] [phone]

## 2021-04-29 NOTE — Progress Notes (Signed)
PT Cancellation Note  Patient Details Name: [name] [name] MRN: [id number] DOB: 1950/12/14   Cancelled Treatment:    Reason Eval/Treat Not Completed: Patient at procedure or test/unavailable (HD).  Wyona Almas, PT, DPT Acute Rehabilitation Services Pager [phone] Office [phone]    Deno Etienne 04/29/2021, 7:46 AM

## 2021-04-29 NOTE — Progress Notes (Signed)
Decreased bp noted. UF goal decreased.

## 2021-04-29 NOTE — Plan of Care (Signed)
  Problem: Fluid Volume: Goal: Compliance with measures to maintain balanced fluid volume will improve Outcome: Adequate for Discharge   Problem: Nutritional: Goal: Ability to make healthy dietary choices will improve Outcome: Adequate for Discharge   Problem: Education: Goal: Knowledge of General Education information will improve Description: Including pain rating scale, medication(s)/side effects and non-pharmacologic comfort measures Outcome: Adequate for Discharge   Problem: Health Behavior/Discharge Planning: Goal: Ability to manage health-related needs will improve Outcome: Adequate for Discharge   Problem: Clinical Measurements: Goal: Ability to maintain clinical measurements within normal limits will improve Outcome: Adequate for Discharge Goal: Will remain free from infection Outcome: Adequate for Discharge   Problem: Activity: Goal: Risk for activity intolerance will decrease Outcome: Adequate for Discharge   Problem: Coping: Goal: Level of anxiety will decrease Outcome: Adequate for Discharge

## 2021-04-29 NOTE — Progress Notes (Signed)
Pt for d/c today. Met with pt at bedside who voices understanding of HD arrangements. Pt has no further questions at this time. Pt has made arrangements with medicaid transportation for transport to HD appts. Clinic aware of pt's d/c today. Pt to complete paperwork at clinic tomorrow at 3:00. Pt's first out-pt HD will be Thursday. [facility] PA aware orders needed by clinic.   Melven Sartorius Renal Navigator [phone]

## 2021-04-29 NOTE — Progress Notes (Addendum)
[facility] [place]  Subjective  - Sore with tingling of right hand at baseline.   Objective (!) 144/71 90 99.1 F (37.3 C) (Oral) 18 99%  Intake/Output Summary (Last 24 hours) at 04/29/2021 0711 Last data filed at 04/28/2021 2109 Gross per 24 hour  Intake 1220 ml  Output 108 ml  Net 1112 ml    Right UE incisions healing well, erythema with mild edema at the graft tunnel site.  Hand warm with good grip strength. Right TDC without drainage in HD currently  Assessment/Planning: POD # 1 Right AV graft placed and right TDC  Do not stick graft for 4 weeks. Once graft is working well she may be scheduled for [facility] removal. F/U for incision check in 2-3 weeks.   Roxy Horseman 04/29/2021 7:11 AM --  Laboratory Lab Results: Recent Labs    04/27/21 0201 04/28/21 0452 04/28/21 0905  WBC 6.5 7.2  --   HGB 9.0* 9.3* 10.9*  HCT 27.7* 27.6* 32.0*  PLT 111* 105*  --    BMET Recent Labs    04/27/21 0201 04/28/21 0452 04/28/21 0905  NA 130* 132* 134*  K 3.4* 3.5 3.6  CL 101 103 102  CO2 18* 17*  --   GLUCOSE 103* 100* 88  BUN 44* 56* 44*  CREATININE 4.37* 5.98* 5.00*  CALCIUM 7.6* 7.5*  --     COAG No results found for: INR, PROTIME No results found for: PTT  I have interviewed the patient and examined the patient. I agree with the findings by the PA.  Good thrill in AV graft.  Palpable radial pulse.  Incisions look fine.  Vascular surgery will be available as needed.  Gae Gallop, MD

## 2021-04-29 NOTE — Progress Notes (Addendum)
Family Medicine Teaching Service Daily Progress Note Intern Pager: [phone]  Patient name: [name] [name] Medical record number: [id number] Date of birth: 1951-06-12 Age: 70 y.o. Gender: female  Primary Care Provider: Zenia Resides, MD Consultants: Nephrology, Vascular Code Status: Full  Pt Overview and Major Events to Date:  10/20: admitted 10/21: tunnel cath placed, first HD session 10/24: second HD, AV graft/fistula placement 10/25: third HD, day of discharge    Assessment and Plan:  [name] [name] is a 70 y.o. female admitted for acute on chronic kidney failure. PMH significant for CKD stage V (now ESRD), Crohn's disease, anemia of chronic disease, COPD, CAD, HFrEF, HTN, HLD and tobacco use.  Acute on Chronic Renal Failure  ESRD  Chronic Metabolic Acidosis Pt is in her third HD session this morning. Cr 5.5 this morning with BUN 46, CO2 17, K 3.6, phosphorous 2.4. AV graft placed yesterday (10/25). This morning, pt is complaining of discomfort in upper right extremity. There is some erythema and swelling distal to the proximal incision, which is to be expected. No dehiscence observed. Pt's sensation in distal rt UE is back to patient's normal state with some neuropathy as she had prior to admission. -Nephro following, appreciate their care -Vascular following, appreciate their care -Third HD session this morning -AV graft fistula placement yesterday (10/24) - Plan to DC today   Crohn's Disease s/p colectomy and ileostomy Colostomy with light liquid brown stool this AM. Stoma site appears well. -Change colostomy pouch twice daily -Boost supplements QID  Anemia of Chronic Disease  Thrombocytopenia Hgb stable at 9 this morning. Platelets 117 this morning, increased from yesterday (105). -continue ESA and FE per nephro -monitor CBC  HTN Last BP stable at 122/62.  Home meds: hydralazine 25 mg TID, carvedilol 6/25 mg BID, and Bidil 20-37.5 mg TID -continue home  carvedilol --holding home hydralazine and bidil  CAD  HLD Chronic and stable -stopped home med plavix as it was being used as primary prevention for a CVA event. Pt agreed to stopping the plavix and was excited to take "one less pill." -continue home lipitor 40 mg daily -heparin held yesterday PM due to protamine dose during AV graft procedure. Pt will be discharged today, so no heparin necessary. SCD's placed yesterday  Remainder of pt's problems are chronic and stable (COPD, tobacco use, anxiety, depression, and peripheral neuropathy). Plan per H&P.   FEN/GI: Renal diet  PPx: SCD Dispo:Home today. Pt will need chair set up.   Subjective:  Pt seen at bedside in HD this morning pleasantly AAOx3. No acute events overnight. Pt is complaining of upper rt arm pain/discomfort at the proximal site of the AV graft/fistula. Painful to touch. Pt is excited to go home.   Objective: Temp:  [97.2 F (36.2 C)-99.1 F (37.3 C)] 98.6 F (37 C) (10/25 0717) Pulse Rate:  [79-100] 89 (10/25 0733) Resp:  [12-31] 29 (10/25 0717) BP: (104-152)/(55-104) 122/62 (10/25 0733) SpO2:  [93 %-100 %] 98 % (10/25 0717) Weight:  [56.6 kg-63.7 kg] 56.6 kg (10/25 0717) Physical Exam: General: AAOx3, resting comfortably, no acute distress Cardiovascular: RRR, w/o murmurs, normal S1/S2 Respiratory: CTAB, normal effort Abdomen: soft and nontender. Ostomy bag in place with light brown liquid stool output Extremities: no peripheral edema  Laboratory: Recent Labs  Lab 04/26/21 0407 04/27/21 0201 04/28/21 0452 04/28/21 0905  WBC 6.5 6.5 7.2  --   HGB 9.2* 9.0* 9.3* 10.9*  HCT 27.1* 27.7* 27.6* 32.0*  PLT 121* 111* 105*  --  Recent Labs  Lab 04/24/21 1604 04/25/21 0007 04/25/21 0421 04/25/21 0749 04/26/21 0407 04/27/21 0201 04/28/21 0452 04/28/21 0905  NA 133*   < > 135   < > 134* 130* 132* 134*  K 5.1   < > 4.2   < > 3.0* 3.4* 3.5 3.6  CL 110  --  112*   < > 103 101 103 102  CO2 12*  --  14*    < > 22 18* 17*  --   BUN 84*  --  84*   < > 32* 44* 56* 44*  CREATININE 5.46*  --  5.56*   < > 2.85* 4.37* 5.98* 5.00*  CALCIUM 9.0  --  8.6*   < > 8.1* 7.6* 7.5*  --   PROT 7.6  --  7.4  --   --   --   --   --   BILITOT 0.8  --  0.7  --   --   --   --   --   ALKPHOS 92  --  85  --   --   --   --   --   ALT 21  --  22  --   --   --   --   --   AST 20  --  22  --   --   --   --   --   GLUCOSE 112*  --  176*   < > 95 103* 100* 88   < > = values in this interval not displayed.      Angus Seller, Medical Student 04/29/2021, 8:09 AM MS-4, [place] Intern pager: [phone], text pages welcome   I was personally present and re-performed the exam and medical decision making and verified the service and findings are accurately documented in the student's note.  Alcus Dad, MD 04/29/2021 1:23 PM

## 2021-04-29 NOTE — Discharge Summary (Signed)
[facility] Discharge Summary  Patient name: [name] [name] Medical record number: [id number] Date of birth: [birth year] Age: 70 y.o. Gender: female Date of Admission: 04/24/2021  Date of Discharge: 04/29/2021 Admitting Physician: Alen Bleacher, MD  Primary Care Provider: Zenia Resides, MD Consultants: Nephrology, vascular surgery  Indication for Hospitalization: Acute on chronic kidney disease  Discharge Diagnoses/Problem List:  ESRD on HD Anemia of CKD Crohn's disease COPD Anxiety HTN HLD HFrEF Tobacco use   Disposition: Home  Discharge Condition: Stable  Discharge Exam:  Gen: alert, resting comfortably, NAD CV: RRR, normal S1/S2  Resp: normal work of breathing, lungs CTAB Abd: soft, nontender, ostomy bag with light brown liquid output, stoma site well-appearing Ext: R tunneled cath c/d/i  Brief Hospital Course:  [name] [name] is a 70 yo female who presented from outpatient nephrology with uremia and abnormal labs who was admitted for new ESRD and initiation of hemodialysis. PMH significant for CKD stage V (now ESRD), Crohn's disease, anemia of chronic disease, COPD, HFrEF, HTN, HLD, and tobacco use. Hospital course as outlined below:  Acute on Chronic Renal Failure  ESRD  Chronic Metabolic Acidosis The patient was admitted after her nephrologist had concern for dehydration due to Cr 5.06 and GFR 9 outpatient in the office. Upon arriving to the ED labs revealed hyperkalemia to 5.1, Cr of 5.46, BUN 84, and GFR 8 as well as signs and symptoms of uremia. Hyperkalemia resolved with lokelma x1. VBG revealed metabolic acidosis with pH 7.25, hypocapnic with bicarb of 10.6 and PO2 of 103.0. Nephrology was consulted and they recommended to begin hemodialysis. IR placed tunneled cath on 10/21 and then vascular placed an AV graft on 10/25 in the pt's right upper extremity without complication. She received HD on 10/21, 10/24 and 10/25 and tolerated the  sessions well. Outpatient HD scheduled Tuesday/Thursday/Saturday.  Crohn's Disease s/p colectomy and ileostomy No complications associated with Crohn's while admitted. Her ostomy site was maintained and cared for by wound RN. Home mesalamine continued.   Anemia of Chronic Disease  Thrombocytopenia Received 1 u PRBC during hemodialysis due to hemoglobin of 7.5. Hgb 9.0 on discharge. Platelets 111 on day of discharge. Folate, and B12 levels were all WNL.    Issues for Follow Up:  Plavix was discontinued. As best we can tell, this was started for primary prevention back in 2012 and is not indicated. Hydralazine was discontinued due to decreased BP with initiation of hemodialysis. Recommend age appropriate colonoscopy due to possible component of iron deficiency anemia (saturation ratio of 19%)  Significant Procedures: Tunnel cath placed 10/21, AV graft placed 10/24 Received HD 10/21, 10/24, and 10/25  Significant Labs and Imaging:  Recent Labs  Lab 04/27/21 0201 04/28/21 0452 04/28/21 0905 04/29/21 0742  WBC 6.5 7.2  --  7.9  HGB 9.0* 9.3* 10.9* 9.0*  HCT 27.7* 27.6* 32.0* 28.0*  PLT 111* 105*  --  117*   Recent Labs  Lab 04/24/21 1604 04/25/21 0007 04/25/21 0421 04/25/21 0749 04/25/21 1924 04/26/21 0407 04/27/21 0201 04/28/21 0452 04/28/21 0905 04/29/21 0742  NA 133*   < > 135   < > 133* 134* 130* 132* 134* 130*  K 5.1   < > 4.2   < > 3.9 3.0* 3.4* 3.5 3.6 3.6  CL 110  --  112*   < > 111 103 101 103 102 102  CO2 12*  --  14*   < > 13* 22 18* 17*  --  17*  GLUCOSE 112*  --  176*   < > 137* 95 103* 100* 88 93  BUN 84*  --  84*   < > 80* 32* 44* 56* 44* 46*  CREATININE 5.46*  --  5.56*   < > 5.35* 2.85* 4.37* 5.98* 5.00* 5.50*  CALCIUM 9.0  --  8.6*   < > 7.8* 8.1* 7.6* 7.5*  --  7.7*  PHOS  --   --  3.6   < > 4.4 2.8 2.6 1.8*  --  2.4*  ALKPHOS 92  --  85  --   --   --   --   --   --   --   AST 20  --  22  --   --   --   --   --   --   --   ALT 21  --  22  --   --    --   --   --   --   --   ALBUMIN 3.7  --  3.5   < > 3.3* 3.3* 3.1* 3.0*  --  3.1*   < > = values in this interval not displayed.     Results/Tests Pending at Time of Discharge: None  Discharge Medications:  Allergies as of 04/29/2021       Reactions   Amoxicillin Anaphylaxis, Rash, Other (See Comments)   Throat Swelling   Aspirin Anaphylaxis, Itching, Rash   Morphine And Related Anaphylaxis   Penicillins Anaphylaxis, Rash   Gabapentin Other (See Comments)   Patient had one time seizure shortly after stopping gabapentin   Ciprofloxacin Swelling   Possible reaction to cipro or clindamycin 04/29/14   Ace Inhibitors Other (See Comments), Cough   ACE possibly associated with cough and switched to ARB.  Would be OK to re-challenge if needed.        Medication List     STOP taking these medications    calcium citrate 950 (200 Ca) MG tablet Commonly known as: CALCITRATE - dosed in mg elemental calcium   cholecalciferol 1000 units tablet Commonly known as: VITAMIN D   clopidogrel 75 MG tablet Commonly known as: PLAVIX   Ferrous Fumarate 86 (27 Fe) MG Caps   hydrALAZINE 25 MG tablet Commonly known as: APRESOLINE   multivitamin tablet   sodium bicarbonate 650 MG tablet       TAKE these medications    ascorbic acid 500 MG tablet Commonly known as: VITAMIN C Take 500 mg by mouth daily.   atorvastatin 40 MG tablet Commonly known as: LIPITOR Take 1 tablet (40 mg total) by mouth daily. Stop taking simvastatin. What changed: additional instructions   Biotin 1000 MCG tablet Take 1,000 mcg by mouth daily.   carvedilol 6.25 MG tablet Commonly known as: COREG Take 1 tablet (6.25 mg total) by mouth 2 (two) times daily with a meal. What changed: Another medication with the same name was removed. Continue taking this medication, and follow the directions you see here.   isosorbide mononitrate 30 MG 24 hr tablet Commonly known as: IMDUR Take 1 tablet (30 mg total) by  mouth daily.   LORazepam 0.5 MG tablet Commonly known as: ATIVAN TAKE ONE TABLET BY MOUTH TWICE DAILY AS NEEDED FOR ANXIETY What changed:  reasons to take this additional instructions   Mesalamine 800 MG Tbec TAKE ONE TABLET BY MOUTH THREE TIMES DAILY   mirtazapine 15 MG tablet Commonly known as: REMERON Take 1 tablet (15 mg total) by mouth at bedtime.  pregabalin 25 MG capsule Commonly known as: LYRICA Take 2 capsules (50 mg total) by mouth at bedtime. What changed:  how much to take when to take this   triamcinolone ointment 0.1 % Commonly known as: KENALOG Apply 1 application topically 2 (two) times daily. Apply to skin around stoma   TYLENOL ARTHRITIS PAIN PO Take 1 tablet by mouth daily as needed (pain).        Discharge Instructions: Please refer to Patient Instructions section of EMR for full details.  Patient was counseled important signs and symptoms that should prompt return to medical care, changes in medications, dietary instructions, activity restrictions, and follow up appointments.   Follow-Up Appointments:  [facility] Follow up.   Why: Schedule is Tuesday, Thursday, Saturday. Patient can start on Thursday, 10/27. Patient needs to go to clinic on Wednesday, 10/26 at 3:00 to complete paperwork. Treatment days, patient will need to arrive at 6:40 for 7:00 chair time. Contact information: [address] [place] [address] [phone]         Angelia Mould, MD Follow up in 2 week(s).   Specialties: Vascular Surgery, Cardiology Why: Office will call you to arrange your appt (sent) Contact information: [place] [address] [phone]                Future Appointments  Date Time Provider [place]  05/19/2021  9:30 AM Zenia Resides, MD [facility]  05/21/2021  3:15 PM [facility]    Alcus Dad, MD 04/29/2021, 3:36 PM PGY-2, [place]

## 2021-04-30 DIAGNOSIS — K5090 Crohn's disease, unspecified, without complications: Secondary | ICD-10-CM | POA: Diagnosis not present

## 2021-04-30 DIAGNOSIS — Z933 Colostomy status: Secondary | ICD-10-CM | POA: Diagnosis not present

## 2021-05-01 DIAGNOSIS — N186 End stage renal disease: Secondary | ICD-10-CM | POA: Diagnosis not present

## 2021-05-01 DIAGNOSIS — Z23 Encounter for immunization: Secondary | ICD-10-CM | POA: Diagnosis not present

## 2021-05-01 DIAGNOSIS — D689 Coagulation defect, unspecified: Secondary | ICD-10-CM | POA: Diagnosis not present

## 2021-05-01 DIAGNOSIS — R392 Extrarenal uremia: Secondary | ICD-10-CM | POA: Diagnosis not present

## 2021-05-01 DIAGNOSIS — R799 Abnormal finding of blood chemistry, unspecified: Secondary | ICD-10-CM | POA: Diagnosis not present

## 2021-05-01 DIAGNOSIS — Z992 Dependence on renal dialysis: Secondary | ICD-10-CM | POA: Diagnosis not present

## 2021-05-03 DIAGNOSIS — D689 Coagulation defect, unspecified: Secondary | ICD-10-CM | POA: Diagnosis not present

## 2021-05-03 DIAGNOSIS — Z23 Encounter for immunization: Secondary | ICD-10-CM | POA: Diagnosis not present

## 2021-05-03 DIAGNOSIS — Z992 Dependence on renal dialysis: Secondary | ICD-10-CM | POA: Diagnosis not present

## 2021-05-03 DIAGNOSIS — N186 End stage renal disease: Secondary | ICD-10-CM | POA: Diagnosis not present

## 2021-05-05 DIAGNOSIS — N186 End stage renal disease: Secondary | ICD-10-CM | POA: Diagnosis not present

## 2021-05-05 DIAGNOSIS — I129 Hypertensive chronic kidney disease with stage 1 through stage 4 chronic kidney disease, or unspecified chronic kidney disease: Secondary | ICD-10-CM | POA: Diagnosis not present

## 2021-05-05 DIAGNOSIS — Z992 Dependence on renal dialysis: Secondary | ICD-10-CM | POA: Diagnosis not present

## 2021-05-06 ENCOUNTER — Emergency Department (HOSPITAL_COMMUNITY)
Admission: EM | Admit: 2021-05-06 | Discharge: 2021-05-06 | Disposition: A | Discharge status: Home / Self Care | Payer: Medicare Other | Attending: Emergency Medicine | Admitting: Emergency Medicine

## 2021-05-06 DIAGNOSIS — N185 Chronic kidney disease, stage 5: Secondary | ICD-10-CM | POA: Diagnosis not present

## 2021-05-06 DIAGNOSIS — J45909 Unspecified asthma, uncomplicated: Secondary | ICD-10-CM | POA: Insufficient documentation

## 2021-05-06 DIAGNOSIS — Z23 Encounter for immunization: Secondary | ICD-10-CM | POA: Diagnosis not present

## 2021-05-06 DIAGNOSIS — R6889 Other general symptoms and signs: Secondary | ICD-10-CM | POA: Diagnosis not present

## 2021-05-06 DIAGNOSIS — N2581 Secondary hyperparathyroidism of renal origin: Secondary | ICD-10-CM | POA: Diagnosis not present

## 2021-05-06 DIAGNOSIS — F17210 Nicotine dependence, cigarettes, uncomplicated: Secondary | ICD-10-CM | POA: Insufficient documentation

## 2021-05-06 DIAGNOSIS — I509 Heart failure, unspecified: Secondary | ICD-10-CM | POA: Diagnosis not present

## 2021-05-06 DIAGNOSIS — R110 Nausea: Secondary | ICD-10-CM | POA: Diagnosis not present

## 2021-05-06 DIAGNOSIS — R42 Dizziness and giddiness: Secondary | ICD-10-CM | POA: Insufficient documentation

## 2021-05-06 DIAGNOSIS — Z79899 Other long term (current) drug therapy: Secondary | ICD-10-CM | POA: Diagnosis not present

## 2021-05-06 DIAGNOSIS — I959 Hypotension, unspecified: Secondary | ICD-10-CM | POA: Diagnosis not present

## 2021-05-06 DIAGNOSIS — I2510 Atherosclerotic heart disease of native coronary artery without angina pectoris: Secondary | ICD-10-CM | POA: Insufficient documentation

## 2021-05-06 DIAGNOSIS — T8249XA Other complication of vascular dialysis catheter, initial encounter: Secondary | ICD-10-CM | POA: Diagnosis not present

## 2021-05-06 DIAGNOSIS — Z743 Need for continuous supervision: Secondary | ICD-10-CM | POA: Diagnosis not present

## 2021-05-06 DIAGNOSIS — D631 Anemia in chronic kidney disease: Secondary | ICD-10-CM | POA: Diagnosis not present

## 2021-05-06 DIAGNOSIS — D689 Coagulation defect, unspecified: Secondary | ICD-10-CM | POA: Diagnosis not present

## 2021-05-06 DIAGNOSIS — N186 End stage renal disease: Secondary | ICD-10-CM | POA: Diagnosis not present

## 2021-05-06 DIAGNOSIS — I132 Hypertensive heart and chronic kidney disease with heart failure and with stage 5 chronic kidney disease, or end stage renal disease: Secondary | ICD-10-CM | POA: Diagnosis not present

## 2021-05-06 DIAGNOSIS — Z992 Dependence on renal dialysis: Secondary | ICD-10-CM | POA: Insufficient documentation

## 2021-05-06 LAB — CBC
HCT: 26.2 % — ABNORMAL LOW (ref 36.0–46.0)
Hemoglobin: 8 g/dL — ABNORMAL LOW (ref 12.0–15.0)
MCH: 31.3 pg (ref 26.0–34.0)
MCHC: 30.5 g/dL (ref 30.0–36.0)
MCV: 102.3 fL — ABNORMAL HIGH (ref 80.0–100.0)
Platelets: 128 10*3/uL — ABNORMAL LOW (ref 150–400)
RBC: 2.56 MIL/uL — ABNORMAL LOW (ref 3.87–5.11)
RDW: 16 % — ABNORMAL HIGH (ref 11.5–15.5)
WBC: 6.6 10*3/uL (ref 4.0–10.5)
nRBC: 0 % (ref 0.0–0.2)

## 2021-05-06 LAB — BASIC METABOLIC PANEL
Anion gap: 11 (ref 5–15)
BUN: 29 mg/dL — ABNORMAL HIGH (ref 8–23)
CO2: 19 mmol/L — ABNORMAL LOW (ref 22–32)
Calcium: 8.6 mg/dL — ABNORMAL LOW (ref 8.9–10.3)
Chloride: 102 mmol/L (ref 98–111)
Creatinine, Ser: 6.46 mg/dL — ABNORMAL HIGH (ref 0.44–1.00)
GFR, Estimated: 7 mL/min — ABNORMAL LOW (ref >60)
Glucose, Bld: 99 mg/dL (ref 70–99)
Potassium: 4.4 mmol/L (ref 3.5–5.1)
Sodium: 132 mmol/L — ABNORMAL LOW (ref 135–145)

## 2021-05-06 NOTE — Discharge Instructions (Signed)
Return to the ER for any new or concerning symptoms. Follow up with your primary care provider if symptoms continue.

## 2021-05-06 NOTE — ED Provider Notes (Signed)
[facility] EMERGENCY DEPARTMENT Provider Note   CSN: [id number] Arrival date & time: 05/06/21  0801     History Chief Complaint  Patient presents with   Hypotension   Dizziness    [name] [name] is a 70 y.o. female.  70 year old female brought in by EMS from dialysis.  Patient just recently started dialysis on October 20, has a chest catheter.  Patient did not take her medications this morning or eat breakfast, went to dialysis and after 34 minutes began to feel dizzy and states her blood pressure went down to 60.  Patient was brought to the ER, her symptoms have completely resolved and she is feeling back to baseline.  Patient attends dialysis Tuesday, Thursday, Saturday, has had full sessions up until this point and has not missed any sessions.  She denies fevers or sick symptoms, chest pain or any other complaints or concerns today.      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   Hyperlipidemia    Hypertension    Hypertensive retinopathy    OU   Osteopenia    Panic attacks    Reflux esophagitis    Seizure (HCC)    hx of   Tobacco abuse    Vitamin D deficiency     Patient Active Problem List   Diagnosis Date Noted   Renal failure (ARF), acute on chronic ([place]) 04/25/2021   Renal failure    CKD (chronic kidney disease) stage 5, GFR less than 15 ml/min (HCC)    HFrEF (heart failure with reduced ejection fraction) ([place]) 03/27/2020   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    Right knee pain 11/02/2018   Eczema, dyshidrotic 05/04/2014   Hypomagnesemia 07/13/2013   Unspecified vitamin D deficiency 05/20/2013   Hypotension 02/08/2013   CAD (coronary artery disease) 05/30/2012   Tobacco abuse 05/30/2012   Anemia due to chronic kidney disease 05/30/2012   Osteopenia 10/05/2011   Atypical squamous cells of undetermined significance on cytologic smear of cervix ([facility]) 09/21/2011   Left renal artery stenosis ([place]) 09/04/2011   CKD (chronic kidney disease) stage 4, GFR 15-29 ml/min ([place]) 07/30/2011   History of seizure 03/13/2011   Numbness and tingling in right hand 11/18/2007   HYPERCHOLESTEROLEMIA 01/10/2007   Anxiety 09/02/2006   NEUROPATHY, PERIPHERAL 09/02/2006   Reflux esophagitis 09/02/2006   Regional enteritis ([place]) 09/02/2006    Past Surgical History:  Procedure Laterality Date   AV FISTULA PLACEMENT Right 04/28/2021   Procedure: RIGHT ARTERIOVENOUS (AV) GRAFT INSERTION USING 4-7MM x 45CM GORE-TEX GRAFT;  Surgeon: Angelia Mould, MD;  Location: [place];  Service: Vascular;  Laterality: Right;   CATARACT EXTRACTION Bilateral    Dr. Clent Jacks   CATARACT EXTRACTION W/ INTRAOCULAR LENS  IMPLANT, BILATERAL  ~ 2000   CHOLECYSTECTOMY  01/28/2005   COLOSTOMY  03/1996   diverting   ECTOPIC PREGNANCY SURGERY  ?1980's   left  EYE SURGERY Bilateral    Cat Sx - Dr. Clent Jacks   ILEOSTOMY  ?  2002   IR FLUORO GUIDE CV LINE RIGHT  04/25/2021   IR US GUIDE VASC ACCESS RIGHT  04/25/2021   NECK SURGERY  2020   "pinched nerve"     OB History     Gravida  2   Para  1   Term  1   Preterm  0   AB  1   Living  1      SAB  0   IAB  0   Ectopic  1   Multiple  0   Live Births              Family History  Problem Relation Age of Onset   Heart disease Mother    Other Mother        Covid   Glaucoma Mother    Stroke Father    Other Sister        AIDS    Social History   Tobacco Use   Smoking status: Every Day     Packs/day: 0.12    Years: 30.00    Pack years: 3.60    Types: Cigarettes   Smokeless tobacco: Never  Substance Use Topics   Alcohol use: No    Comment: 05/30/2012 "used to drink back in the day; last alcohol 23 yr ago"   Drug use: No    Home Medications Prior to Admission medications   Medication Sig Start Date End Date Taking? Authorizing Provider  Acetaminophen (TYLENOL ARTHRITIS PAIN PO) Take 1 tablet by mouth daily as needed (pain).    [provider]  ascorbic acid (VITAMIN C) 500 MG tablet Take 500 mg by mouth daily. 01/21/19   [provider]  atorvastatin (LIPITOR) 40 MG tablet Take 1 tablet (40 mg total) by mouth daily. Stop taking simvastatin. Patient taking differently: Take 40 mg by mouth daily. 01/30/21   Zenia Resides, MD  Biotin 1000 MCG tablet Take 1,000 mcg by mouth daily.    [provider]  carvedilol (COREG) 6.25 MG tablet Take 1 tablet (6.25 mg total) by mouth 2 (two) times daily with a meal. 04/29/21   Alcus Dad, MD  isosorbide mononitrate (IMDUR) 30 MG 24 hr tablet Take 1 tablet (30 mg total) by mouth daily. 06/19/20 06/14/21  Geralynn Rile, MD  LORazepam (ATIVAN) 0.5 MG tablet TAKE ONE TABLET BY MOUTH TWICE DAILY AS NEEDED FOR ANXIETY Patient taking differently: Take 0.5 mg by mouth 2 (two) times daily as needed for anxiety. 11/04/20   Zenia Resides, MD  Mesalamine 800 MG TBEC TAKE ONE TABLET BY MOUTH THREE TIMES DAILY Patient not taking: No sig reported 02/08/20   Zenia Resides, MD  mirtazapine (REMERON) 15 MG tablet Take 1 tablet (15 mg total) by mouth at bedtime. 10/07/20   McDiarmid, Blane Ohara, MD  pregabalin (LYRICA) 25 MG capsule Take 2 capsules (50 mg total) by mouth at bedtime. 04/29/21   Alcus Dad, MD  triamcinolone ointment (KENALOG) 0.1 % Apply 1 application topically 2 (two) times daily. Apply to skin around stoma 03/14/20   McDiarmid, Blane Ohara, MD    Allergies    Amoxicillin, Aspirin, Morphine and related,  Penicillins, Gabapentin, Ciprofloxacin, and Ace inhibitors  Review of Systems   Review of Systems  Constitutional:  Negative for chills and fever.  Respiratory:  Negative for shortness of breath.   Cardiovascular:  Negative for chest  pain.  Gastrointestinal:  Positive for nausea. Negative for abdominal pain and vomiting.  Genitourinary:  Negative for decreased urine volume, difficulty urinating and dysuria.  Musculoskeletal:  Negative for arthralgias and myalgias.  Skin:  Negative for rash and wound.  Allergic/Immunologic: Positive for immunocompromised state.  Neurological:  Positive for dizziness. Negative for weakness.  Psychiatric/Behavioral:  Negative for confusion.   All other systems reviewed and are negative.  Physical Exam Updated Vital Signs BP (!) 117/59   Pulse 89   Temp 98.6 F (37 C) (Oral)   Resp 18   SpO2 100%   Physical Exam Vitals and nursing note reviewed.  Constitutional:      General: She is not in acute distress.    Appearance: She is well-developed. She is not diaphoretic.  HENT:     Head: Normocephalic and atraumatic.  Cardiovascular:     Rate and Rhythm: Normal rate and regular rhythm.     Pulses: Normal pulses.     Heart sounds: Normal heart sounds.  Pulmonary:     Effort: Pulmonary effort is normal.     Breath sounds: Normal breath sounds.  Abdominal:     Palpations: Abdomen is soft.     Tenderness: There is no abdominal tenderness.  Musculoskeletal:     Right lower leg: No edema.     Left lower leg: No edema.  Skin:    General: Skin is warm and dry.     Findings: No erythema or rash.     Comments: Right upper chest wall catheter  Neurological:     Mental Status: She is alert and oriented to person, place, and time.  Psychiatric:        Behavior: Behavior normal.    ED Results / Procedures / Treatments   Labs (all labs ordered are listed, but only abnormal results are displayed) Labs Reviewed  BASIC METABOLIC PANEL - Abnormal;  Notable for the following components:      Result Value   Sodium 132 (*)    CO2 19 (*)    BUN 29 (*)    Creatinine, Ser 6.46 (*)    Calcium 8.6 (*)    GFR, Estimated 7 (*)    All other components within normal limits  CBC - Abnormal; Notable for the following components:   RBC 2.56 (*)    Hemoglobin 8.0 (*)    HCT 26.2 (*)    MCV 102.3 (*)    RDW 16.0 (*)    Platelets 128 (*)    All other components within normal limits  URINALYSIS, ROUTINE W REFLEX MICROSCOPIC  CBG MONITORING, ED    EKG EKG Interpretation  Date/Time:  Tuesday May 06 2021 08:14:23 EDT Ventricular Rate:  92 PR Interval:  166 QRS Duration: 100 QT Interval:  386 QTC Calculation: 477 R Axis:   57 Text Interpretation: Normal sinus rhythm Normal ECG Confirmed by Pattricia Boss [phone]) on 05/06/2021 1:59:12 PM  Radiology No results found.  Procedures Procedures   Medications Ordered in ED Medications - No data to display  ED Course  I have reviewed the triage vital signs and the nursing notes.  Pertinent labs & imaging results that were available during my care of the patient were reviewed by me and considered in my medical decision making (see chart for details).  Clinical Course as of 05/06/21 1725  Tue May 06, 7914  4051 70 year old female brought in by EMS from dialysis for low blood pressure and feeling dizzy.  Dialysis was discontinued and  patient was transferred to the ER where she has been monitored for the past almost 8 hours and feels improved.  CBC and BMP reviewed, not significantly changed from prior.  Her potassium is normal at 4.4.  Blood pressure on arrival of 90/51, currently 108/56.  O2 sat 100% on room air.  EKG without acute ischemic changes. Plan is to ambulate patient and if she does well, may discharge home to follow-up with her primary care provider and continue with her dialysis schedule. [LM]    Clinical Course User Index [LM] Roque Lias   MDM  Rules/Calculators/A&P                           Final Clinical Impression(s) / ED Diagnoses Final diagnoses:  Dizziness    Rx / DC Orders ED Discharge Orders     None        Tacy Learn, PA-C 05/06/21 1726    Truddie Hidden, MD 05/07/21 0700

## 2021-05-06 NOTE — ED Triage Notes (Signed)
EMS stated, she was at dialysis and stayed on for about 108mns. And her pressure went to 60. No other complaints.

## 2021-05-06 NOTE — ED Notes (Signed)
Pt ambulated in room, BP stayed around 120 over 70

## 2021-05-06 NOTE — ED Notes (Signed)
Pt called for vitals recheck, no response. Will try again in 5 minutes.

## 2021-05-08 DIAGNOSIS — D689 Coagulation defect, unspecified: Secondary | ICD-10-CM | POA: Diagnosis not present

## 2021-05-08 DIAGNOSIS — T8249XA Other complication of vascular dialysis catheter, initial encounter: Secondary | ICD-10-CM | POA: Diagnosis not present

## 2021-05-08 DIAGNOSIS — Z23 Encounter for immunization: Secondary | ICD-10-CM | POA: Diagnosis not present

## 2021-05-08 DIAGNOSIS — Z992 Dependence on renal dialysis: Secondary | ICD-10-CM | POA: Diagnosis not present

## 2021-05-08 DIAGNOSIS — D631 Anemia in chronic kidney disease: Secondary | ICD-10-CM | POA: Diagnosis not present

## 2021-05-08 DIAGNOSIS — N186 End stage renal disease: Secondary | ICD-10-CM | POA: Diagnosis not present

## 2021-05-08 DIAGNOSIS — N2581 Secondary hyperparathyroidism of renal origin: Secondary | ICD-10-CM | POA: Diagnosis not present

## 2021-05-10 DIAGNOSIS — D631 Anemia in chronic kidney disease: Secondary | ICD-10-CM | POA: Diagnosis not present

## 2021-05-10 DIAGNOSIS — N2581 Secondary hyperparathyroidism of renal origin: Secondary | ICD-10-CM | POA: Diagnosis not present

## 2021-05-10 DIAGNOSIS — Z23 Encounter for immunization: Secondary | ICD-10-CM | POA: Diagnosis not present

## 2021-05-10 DIAGNOSIS — D689 Coagulation defect, unspecified: Secondary | ICD-10-CM | POA: Diagnosis not present

## 2021-05-10 DIAGNOSIS — N186 End stage renal disease: Secondary | ICD-10-CM | POA: Diagnosis not present

## 2021-05-10 DIAGNOSIS — T8249XA Other complication of vascular dialysis catheter, initial encounter: Secondary | ICD-10-CM | POA: Diagnosis not present

## 2021-05-10 DIAGNOSIS — Z992 Dependence on renal dialysis: Secondary | ICD-10-CM | POA: Diagnosis not present

## 2021-05-13 DIAGNOSIS — Z23 Encounter for immunization: Secondary | ICD-10-CM | POA: Diagnosis not present

## 2021-05-13 DIAGNOSIS — D631 Anemia in chronic kidney disease: Secondary | ICD-10-CM | POA: Diagnosis not present

## 2021-05-13 DIAGNOSIS — N186 End stage renal disease: Secondary | ICD-10-CM | POA: Diagnosis not present

## 2021-05-13 DIAGNOSIS — Z992 Dependence on renal dialysis: Secondary | ICD-10-CM | POA: Diagnosis not present

## 2021-05-13 DIAGNOSIS — T8249XA Other complication of vascular dialysis catheter, initial encounter: Secondary | ICD-10-CM | POA: Diagnosis not present

## 2021-05-13 DIAGNOSIS — D689 Coagulation defect, unspecified: Secondary | ICD-10-CM | POA: Diagnosis not present

## 2021-05-13 DIAGNOSIS — N2581 Secondary hyperparathyroidism of renal origin: Secondary | ICD-10-CM | POA: Diagnosis not present

## 2021-05-15 DIAGNOSIS — N186 End stage renal disease: Secondary | ICD-10-CM | POA: Diagnosis not present

## 2021-05-15 DIAGNOSIS — T8249XA Other complication of vascular dialysis catheter, initial encounter: Secondary | ICD-10-CM | POA: Diagnosis not present

## 2021-05-15 DIAGNOSIS — Z992 Dependence on renal dialysis: Secondary | ICD-10-CM | POA: Diagnosis not present

## 2021-05-15 DIAGNOSIS — N2581 Secondary hyperparathyroidism of renal origin: Secondary | ICD-10-CM | POA: Diagnosis not present

## 2021-05-15 DIAGNOSIS — Z23 Encounter for immunization: Secondary | ICD-10-CM | POA: Diagnosis not present

## 2021-05-15 DIAGNOSIS — D689 Coagulation defect, unspecified: Secondary | ICD-10-CM | POA: Diagnosis not present

## 2021-05-15 DIAGNOSIS — D631 Anemia in chronic kidney disease: Secondary | ICD-10-CM | POA: Diagnosis not present

## 2021-05-17 DIAGNOSIS — N186 End stage renal disease: Secondary | ICD-10-CM | POA: Diagnosis not present

## 2021-05-17 DIAGNOSIS — T8249XA Other complication of vascular dialysis catheter, initial encounter: Secondary | ICD-10-CM | POA: Diagnosis not present

## 2021-05-17 DIAGNOSIS — Z23 Encounter for immunization: Secondary | ICD-10-CM | POA: Diagnosis not present

## 2021-05-17 DIAGNOSIS — D631 Anemia in chronic kidney disease: Secondary | ICD-10-CM | POA: Diagnosis not present

## 2021-05-17 DIAGNOSIS — N2581 Secondary hyperparathyroidism of renal origin: Secondary | ICD-10-CM | POA: Diagnosis not present

## 2021-05-17 DIAGNOSIS — D689 Coagulation defect, unspecified: Secondary | ICD-10-CM | POA: Diagnosis not present

## 2021-05-17 DIAGNOSIS — Z992 Dependence on renal dialysis: Secondary | ICD-10-CM | POA: Diagnosis not present

## 2021-05-19 ENCOUNTER — Encounter: Payer: Self-pay | Admitting: Family Medicine

## 2021-05-19 ENCOUNTER — Ambulatory Visit (INDEPENDENT_AMBULATORY_CARE_PROVIDER_SITE_OTHER): Payer: Medicare Other

## 2021-05-19 ENCOUNTER — Ambulatory Visit (INDEPENDENT_AMBULATORY_CARE_PROVIDER_SITE_OTHER): Payer: Medicare Other | Admitting: Family Medicine

## 2021-05-19 ENCOUNTER — Other Ambulatory Visit: Payer: Self-pay

## 2021-05-19 DIAGNOSIS — Z23 Encounter for immunization: Secondary | ICD-10-CM | POA: Diagnosis not present

## 2021-05-19 DIAGNOSIS — N186 End stage renal disease: Secondary | ICD-10-CM | POA: Diagnosis not present

## 2021-05-19 DIAGNOSIS — I959 Hypotension, unspecified: Secondary | ICD-10-CM | POA: Diagnosis not present

## 2021-05-19 DIAGNOSIS — I5020 Unspecified systolic (congestive) heart failure: Secondary | ICD-10-CM | POA: Diagnosis not present

## 2021-05-19 DIAGNOSIS — Z992 Dependence on renal dialysis: Secondary | ICD-10-CM | POA: Diagnosis not present

## 2021-05-19 DIAGNOSIS — L608 Other nail disorders: Secondary | ICD-10-CM | POA: Diagnosis not present

## 2021-05-19 MED ORDER — ISOSORBIDE MONONITRATE ER 30 MG PO TB24: 15.0000 mg | ORAL_TABLET | Freq: Every day | ORAL | 3 refills | Status: DC

## 2021-05-19 NOTE — Patient Instructions (Addendum)
You look good.   I think the weight gain is healthy, not fluid.  You needed the extra weight. The one pill, isosorbide mononitrate/Imdur - only take a half pill per day.  I hope that helps your blood pressure. See me in one month - I may need to make other adjustments.   Someone should call with the foot doctor (podiatry) referral.

## 2021-05-20 ENCOUNTER — Encounter: Payer: Self-pay | Admitting: Family Medicine

## 2021-05-20 DIAGNOSIS — T8249XA Other complication of vascular dialysis catheter, initial encounter: Secondary | ICD-10-CM | POA: Diagnosis not present

## 2021-05-20 DIAGNOSIS — Z23 Encounter for immunization: Secondary | ICD-10-CM | POA: Diagnosis not present

## 2021-05-20 DIAGNOSIS — D689 Coagulation defect, unspecified: Secondary | ICD-10-CM | POA: Diagnosis not present

## 2021-05-20 DIAGNOSIS — Z992 Dependence on renal dialysis: Secondary | ICD-10-CM | POA: Diagnosis not present

## 2021-05-20 DIAGNOSIS — N186 End stage renal disease: Secondary | ICD-10-CM | POA: Diagnosis not present

## 2021-05-20 DIAGNOSIS — D631 Anemia in chronic kidney disease: Secondary | ICD-10-CM | POA: Diagnosis not present

## 2021-05-20 DIAGNOSIS — N2581 Secondary hyperparathyroidism of renal origin: Secondary | ICD-10-CM | POA: Diagnosis not present

## 2021-05-20 NOTE — Assessment & Plan Note (Addendum)
05/19/21 Best estimate dry wt=130 lbs. Appears euvolemic.  It would be nice to get her on proven mortality benefit drugs.  (Stay on nitrate and beta blocker and add hydralyzine) but BP will not currently tolerate.  Decrease imdur to 15 mg daily.  Could also switch coreg to metoprolol in the realistic hope of less BP effect.  Continue to hold off on hydralyzine.

## 2021-05-20 NOTE — Assessment & Plan Note (Signed)
Decrease imdur to 15 mg daily.

## 2021-05-20 NOTE — Assessment & Plan Note (Signed)
Doing well on dialysis.  I think the lean body weight gain is evidence that she was uremic and needed dialysis.

## 2021-05-20 NOTE — Assessment & Plan Note (Signed)
Podiatry referral as requested.

## 2021-05-20 NOTE — Progress Notes (Signed)
    SUBJECTIVE:   CHIEF COMPLAINT / HPI:   FU hospitalization in which she progressed to dialysis requiring ESRD.  Issues: ESRD.  Tolerating dialysis well and adjusting to lifestyle.  Only problem is hypotension - see problem 2. Hypotension - mainly a problem at dialysis.  Denies lightheadedness or syncope.  Only meds that might contribute to her hypotension is imdur. And carvedilol. See next problem HFrEF.  ACE/ARB intolerant.  On modest dose of Beta blocker.  On nitrate and not on hydralyzine due to hypotension.  Denies DOE or ankle swelling.   Wants covid booster today. Want referral to podiatry for long, thick toenails.   OBJECTIVE:   BP (!) 96/53   Pulse 92   Ht 5' 6"  (1.676 m)   Wt 130 lb 9.6 oz (59.2 kg)   SpO2 100%   BMI 21.08 kg/m   VS noted including modest weight gian (now more healthy BMI.) Lungs clear Cardiac RRR without m or g Ext no edema.  ASSESSMENT/PLAN:   HFrEF (heart failure with reduced ejection fraction) ([place]) 05/19/21 Best estimate dry wt=130 lbs. Appears euvolemic.  It would be nice to get her on proven mortality benefit drugs.  (Stay on nitrate and beta blocker and add hydralyzine) but BP will not currently tolerate.  Decrease imdur to 15 mg daily.  Could also switch coreg to metoprolol in the realistic hope of less BP effect.  Continue to hold off on hydralyzine.  Acquired deformity of toenail Podiatry referral as requested.  ESRD (end stage renal disease) on dialysis [facility]) Doing well on dialysis.  I think the lean body weight gain is evidence that she was uremic and needed dialysis.  Hypotension Decrease imdur to 15 mg daily.     Zenia Resides, MD [place]

## 2021-05-21 ENCOUNTER — Other Ambulatory Visit: Payer: Self-pay

## 2021-05-21 ENCOUNTER — Encounter: Payer: Self-pay | Admitting: Physician Assistant

## 2021-05-21 ENCOUNTER — Ambulatory Visit (INDEPENDENT_AMBULATORY_CARE_PROVIDER_SITE_OTHER): Payer: Medicare Other | Admitting: Physician Assistant

## 2021-05-21 VITALS — BP 82/52 | HR 91 | Temp 97.7°F | Resp 16 | Ht 66.0 in | Wt 125.0 lb

## 2021-05-21 DIAGNOSIS — Z992 Dependence on renal dialysis: Secondary | ICD-10-CM

## 2021-05-21 DIAGNOSIS — N186 End stage renal disease: Secondary | ICD-10-CM

## 2021-05-21 NOTE — Progress Notes (Signed)
    Postoperative Access Visit   History of Present Illness   [name] [name] is a 70 y.o. year old female who presents for postoperative follow-up for: right upper arm AV graft (4-7 mm PTFE graft) on 04/28/21 by Dr. Scot Dock.  The patient's wounds are healing well.  The patient notes no steal symptoms.  The patient is  able to complete their activities of daily living.  The patient's current symptoms are: tingling in her right hand and fingers. She explains that she had this prior to surgery.She denies any pain, coldness or weakness in her right arm or hand  She has a right IJ TDC placed by IR. She dialyzes on Tues/ Thurs/ Sat  Physical Examination   Vitals:   05/21/21 1520  BP: (!) 82/52  Pulse: 91  Resp: 16  Temp: 97.7 F (36.5 C)  TempSrc: Temporal  SpO2: 99%  Weight: 125 lb (56.7 kg)  Height: 5' 6"  (1.676 m)   Body mass index is 20.18 kg/m.  right arm Incisions in right arm are healing well, 2+ radial pulse, hand grip is 5/5, sensation in digits is intact, palpable thrill, bruit can  be auscultated     Medical Decision Making   [name] [name] is a 70 y.o. year old female who presents s/p right upper arm AV graft (4-7 mm PTFE graft) on 04/28/21 by Dr. Scot Dock. Her right upper arm incisions are healing well. AV graft appears to be functioning well.  Patent  without signs or symptoms of steal syndrome The patient's access will be ready for use after 05/29/21 The patient's tunneled dialysis catheter can be removed when Nephrology is comfortable with the performance of the right AV graft The patient may follow up on a prn basis   Karoline Caldwell, PA-C [facility]: [phone]  Clinic MD: Yetta Barre

## 2021-05-22 DIAGNOSIS — T8249XA Other complication of vascular dialysis catheter, initial encounter: Secondary | ICD-10-CM | POA: Diagnosis not present

## 2021-05-22 DIAGNOSIS — Z992 Dependence on renal dialysis: Secondary | ICD-10-CM | POA: Diagnosis not present

## 2021-05-22 DIAGNOSIS — Z23 Encounter for immunization: Secondary | ICD-10-CM | POA: Diagnosis not present

## 2021-05-22 DIAGNOSIS — D689 Coagulation defect, unspecified: Secondary | ICD-10-CM | POA: Diagnosis not present

## 2021-05-22 DIAGNOSIS — N2581 Secondary hyperparathyroidism of renal origin: Secondary | ICD-10-CM | POA: Diagnosis not present

## 2021-05-22 DIAGNOSIS — N186 End stage renal disease: Secondary | ICD-10-CM | POA: Diagnosis not present

## 2021-05-22 DIAGNOSIS — D631 Anemia in chronic kidney disease: Secondary | ICD-10-CM | POA: Diagnosis not present

## 2021-05-24 DIAGNOSIS — N2581 Secondary hyperparathyroidism of renal origin: Secondary | ICD-10-CM | POA: Diagnosis not present

## 2021-05-24 DIAGNOSIS — T8249XA Other complication of vascular dialysis catheter, initial encounter: Secondary | ICD-10-CM | POA: Diagnosis not present

## 2021-05-24 DIAGNOSIS — Z23 Encounter for immunization: Secondary | ICD-10-CM | POA: Diagnosis not present

## 2021-05-24 DIAGNOSIS — D631 Anemia in chronic kidney disease: Secondary | ICD-10-CM | POA: Diagnosis not present

## 2021-05-24 DIAGNOSIS — D689 Coagulation defect, unspecified: Secondary | ICD-10-CM | POA: Diagnosis not present

## 2021-05-24 DIAGNOSIS — Z992 Dependence on renal dialysis: Secondary | ICD-10-CM | POA: Diagnosis not present

## 2021-05-24 DIAGNOSIS — N186 End stage renal disease: Secondary | ICD-10-CM | POA: Diagnosis not present

## 2021-05-27 DIAGNOSIS — Z23 Encounter for immunization: Secondary | ICD-10-CM | POA: Diagnosis not present

## 2021-05-27 DIAGNOSIS — N2581 Secondary hyperparathyroidism of renal origin: Secondary | ICD-10-CM | POA: Diagnosis not present

## 2021-05-27 DIAGNOSIS — T8249XA Other complication of vascular dialysis catheter, initial encounter: Secondary | ICD-10-CM | POA: Diagnosis not present

## 2021-05-27 DIAGNOSIS — D631 Anemia in chronic kidney disease: Secondary | ICD-10-CM | POA: Diagnosis not present

## 2021-05-27 DIAGNOSIS — N186 End stage renal disease: Secondary | ICD-10-CM | POA: Diagnosis not present

## 2021-05-27 DIAGNOSIS — Z992 Dependence on renal dialysis: Secondary | ICD-10-CM | POA: Diagnosis not present

## 2021-05-27 DIAGNOSIS — D689 Coagulation defect, unspecified: Secondary | ICD-10-CM | POA: Diagnosis not present

## 2021-05-30 DIAGNOSIS — T8249XA Other complication of vascular dialysis catheter, initial encounter: Secondary | ICD-10-CM | POA: Diagnosis not present

## 2021-05-30 DIAGNOSIS — Z992 Dependence on renal dialysis: Secondary | ICD-10-CM | POA: Diagnosis not present

## 2021-05-30 DIAGNOSIS — Z23 Encounter for immunization: Secondary | ICD-10-CM | POA: Diagnosis not present

## 2021-05-30 DIAGNOSIS — D631 Anemia in chronic kidney disease: Secondary | ICD-10-CM | POA: Diagnosis not present

## 2021-05-30 DIAGNOSIS — N2581 Secondary hyperparathyroidism of renal origin: Secondary | ICD-10-CM | POA: Diagnosis not present

## 2021-05-30 DIAGNOSIS — N186 End stage renal disease: Secondary | ICD-10-CM | POA: Diagnosis not present

## 2021-05-30 DIAGNOSIS — D689 Coagulation defect, unspecified: Secondary | ICD-10-CM | POA: Diagnosis not present

## 2021-06-01 DIAGNOSIS — N2581 Secondary hyperparathyroidism of renal origin: Secondary | ICD-10-CM | POA: Diagnosis not present

## 2021-06-01 DIAGNOSIS — Z992 Dependence on renal dialysis: Secondary | ICD-10-CM | POA: Diagnosis not present

## 2021-06-01 DIAGNOSIS — N186 End stage renal disease: Secondary | ICD-10-CM | POA: Diagnosis not present

## 2021-06-01 DIAGNOSIS — D689 Coagulation defect, unspecified: Secondary | ICD-10-CM | POA: Diagnosis not present

## 2021-06-01 DIAGNOSIS — T8249XA Other complication of vascular dialysis catheter, initial encounter: Secondary | ICD-10-CM | POA: Diagnosis not present

## 2021-06-01 DIAGNOSIS — Z23 Encounter for immunization: Secondary | ICD-10-CM | POA: Diagnosis not present

## 2021-06-01 DIAGNOSIS — D631 Anemia in chronic kidney disease: Secondary | ICD-10-CM | POA: Diagnosis not present

## 2021-06-03 DIAGNOSIS — D689 Coagulation defect, unspecified: Secondary | ICD-10-CM | POA: Diagnosis not present

## 2021-06-03 DIAGNOSIS — Z23 Encounter for immunization: Secondary | ICD-10-CM | POA: Diagnosis not present

## 2021-06-03 DIAGNOSIS — Z992 Dependence on renal dialysis: Secondary | ICD-10-CM | POA: Diagnosis not present

## 2021-06-03 DIAGNOSIS — N186 End stage renal disease: Secondary | ICD-10-CM | POA: Diagnosis not present

## 2021-06-03 DIAGNOSIS — N2581 Secondary hyperparathyroidism of renal origin: Secondary | ICD-10-CM | POA: Diagnosis not present

## 2021-06-03 DIAGNOSIS — D631 Anemia in chronic kidney disease: Secondary | ICD-10-CM | POA: Diagnosis not present

## 2021-06-03 DIAGNOSIS — T8249XA Other complication of vascular dialysis catheter, initial encounter: Secondary | ICD-10-CM | POA: Diagnosis not present

## 2021-06-04 DIAGNOSIS — I129 Hypertensive chronic kidney disease with stage 1 through stage 4 chronic kidney disease, or unspecified chronic kidney disease: Secondary | ICD-10-CM | POA: Diagnosis not present

## 2021-06-04 DIAGNOSIS — N186 End stage renal disease: Secondary | ICD-10-CM | POA: Diagnosis not present

## 2021-06-04 DIAGNOSIS — Z992 Dependence on renal dialysis: Secondary | ICD-10-CM | POA: Diagnosis not present

## 2021-06-05 ENCOUNTER — Other Ambulatory Visit: Payer: Self-pay | Admitting: Family Medicine

## 2021-06-05 DIAGNOSIS — D631 Anemia in chronic kidney disease: Secondary | ICD-10-CM | POA: Diagnosis not present

## 2021-06-05 DIAGNOSIS — N2581 Secondary hyperparathyroidism of renal origin: Secondary | ICD-10-CM | POA: Diagnosis not present

## 2021-06-05 DIAGNOSIS — R52 Pain, unspecified: Secondary | ICD-10-CM | POA: Diagnosis not present

## 2021-06-05 DIAGNOSIS — F419 Anxiety disorder, unspecified: Secondary | ICD-10-CM

## 2021-06-05 DIAGNOSIS — N186 End stage renal disease: Secondary | ICD-10-CM | POA: Diagnosis not present

## 2021-06-05 DIAGNOSIS — Z992 Dependence on renal dialysis: Secondary | ICD-10-CM | POA: Diagnosis not present

## 2021-06-05 DIAGNOSIS — Z23 Encounter for immunization: Secondary | ICD-10-CM | POA: Diagnosis not present

## 2021-06-05 DIAGNOSIS — D689 Coagulation defect, unspecified: Secondary | ICD-10-CM | POA: Diagnosis not present

## 2021-06-07 DIAGNOSIS — Z23 Encounter for immunization: Secondary | ICD-10-CM | POA: Diagnosis not present

## 2021-06-07 DIAGNOSIS — R52 Pain, unspecified: Secondary | ICD-10-CM | POA: Diagnosis not present

## 2021-06-07 DIAGNOSIS — D631 Anemia in chronic kidney disease: Secondary | ICD-10-CM | POA: Diagnosis not present

## 2021-06-07 DIAGNOSIS — Z992 Dependence on renal dialysis: Secondary | ICD-10-CM | POA: Diagnosis not present

## 2021-06-07 DIAGNOSIS — N2581 Secondary hyperparathyroidism of renal origin: Secondary | ICD-10-CM | POA: Diagnosis not present

## 2021-06-07 DIAGNOSIS — D689 Coagulation defect, unspecified: Secondary | ICD-10-CM | POA: Diagnosis not present

## 2021-06-07 DIAGNOSIS — N186 End stage renal disease: Secondary | ICD-10-CM | POA: Diagnosis not present

## 2021-06-10 DIAGNOSIS — Z992 Dependence on renal dialysis: Secondary | ICD-10-CM | POA: Diagnosis not present

## 2021-06-10 DIAGNOSIS — R52 Pain, unspecified: Secondary | ICD-10-CM | POA: Diagnosis not present

## 2021-06-10 DIAGNOSIS — Z23 Encounter for immunization: Secondary | ICD-10-CM | POA: Diagnosis not present

## 2021-06-10 DIAGNOSIS — N2581 Secondary hyperparathyroidism of renal origin: Secondary | ICD-10-CM | POA: Diagnosis not present

## 2021-06-10 DIAGNOSIS — N186 End stage renal disease: Secondary | ICD-10-CM | POA: Diagnosis not present

## 2021-06-10 DIAGNOSIS — D689 Coagulation defect, unspecified: Secondary | ICD-10-CM | POA: Diagnosis not present

## 2021-06-10 DIAGNOSIS — D631 Anemia in chronic kidney disease: Secondary | ICD-10-CM | POA: Diagnosis not present

## 2021-06-12 DIAGNOSIS — N2581 Secondary hyperparathyroidism of renal origin: Secondary | ICD-10-CM | POA: Diagnosis not present

## 2021-06-12 DIAGNOSIS — Z992 Dependence on renal dialysis: Secondary | ICD-10-CM | POA: Diagnosis not present

## 2021-06-12 DIAGNOSIS — R52 Pain, unspecified: Secondary | ICD-10-CM | POA: Diagnosis not present

## 2021-06-12 DIAGNOSIS — D631 Anemia in chronic kidney disease: Secondary | ICD-10-CM | POA: Diagnosis not present

## 2021-06-12 DIAGNOSIS — N186 End stage renal disease: Secondary | ICD-10-CM | POA: Diagnosis not present

## 2021-06-12 DIAGNOSIS — Z23 Encounter for immunization: Secondary | ICD-10-CM | POA: Diagnosis not present

## 2021-06-12 DIAGNOSIS — D689 Coagulation defect, unspecified: Secondary | ICD-10-CM | POA: Diagnosis not present

## 2021-06-14 DIAGNOSIS — N186 End stage renal disease: Secondary | ICD-10-CM | POA: Diagnosis not present

## 2021-06-14 DIAGNOSIS — R52 Pain, unspecified: Secondary | ICD-10-CM | POA: Diagnosis not present

## 2021-06-14 DIAGNOSIS — Z23 Encounter for immunization: Secondary | ICD-10-CM | POA: Diagnosis not present

## 2021-06-14 DIAGNOSIS — N2581 Secondary hyperparathyroidism of renal origin: Secondary | ICD-10-CM | POA: Diagnosis not present

## 2021-06-14 DIAGNOSIS — Z992 Dependence on renal dialysis: Secondary | ICD-10-CM | POA: Diagnosis not present

## 2021-06-14 DIAGNOSIS — D631 Anemia in chronic kidney disease: Secondary | ICD-10-CM | POA: Diagnosis not present

## 2021-06-14 DIAGNOSIS — D689 Coagulation defect, unspecified: Secondary | ICD-10-CM | POA: Diagnosis not present

## 2021-06-16 ENCOUNTER — Other Ambulatory Visit: Payer: Self-pay

## 2021-06-16 ENCOUNTER — Other Ambulatory Visit: Payer: Self-pay | Admitting: Family Medicine

## 2021-06-16 DIAGNOSIS — K50013 Crohn's disease of small intestine with fistula: Secondary | ICD-10-CM

## 2021-06-16 MED ORDER — ATORVASTATIN CALCIUM 40 MG PO TABS: 40.0000 mg | ORAL_TABLET | Freq: Every day | ORAL | 3 refills | Status: DC

## 2021-06-16 MED ORDER — PREGABALIN 25 MG PO CAPS: 25.0000 mg | ORAL_CAPSULE | Freq: Two times a day (BID) | ORAL | 5 refills | Status: DC

## 2021-06-17 DIAGNOSIS — N186 End stage renal disease: Secondary | ICD-10-CM | POA: Diagnosis not present

## 2021-06-17 DIAGNOSIS — N2581 Secondary hyperparathyroidism of renal origin: Secondary | ICD-10-CM | POA: Diagnosis not present

## 2021-06-17 DIAGNOSIS — D631 Anemia in chronic kidney disease: Secondary | ICD-10-CM | POA: Diagnosis not present

## 2021-06-17 DIAGNOSIS — Z23 Encounter for immunization: Secondary | ICD-10-CM | POA: Diagnosis not present

## 2021-06-17 DIAGNOSIS — D689 Coagulation defect, unspecified: Secondary | ICD-10-CM | POA: Diagnosis not present

## 2021-06-17 DIAGNOSIS — Z992 Dependence on renal dialysis: Secondary | ICD-10-CM | POA: Diagnosis not present

## 2021-06-17 DIAGNOSIS — R52 Pain, unspecified: Secondary | ICD-10-CM | POA: Diagnosis not present

## 2021-06-19 ENCOUNTER — Emergency Department (HOSPITAL_COMMUNITY): Payer: Medicare Other

## 2021-06-19 ENCOUNTER — Encounter (HOSPITAL_COMMUNITY): Payer: Self-pay

## 2021-06-19 ENCOUNTER — Emergency Department (HOSPITAL_COMMUNITY)
Admission: EM | Admit: 2021-06-19 | Discharge: 2021-06-19 | Disposition: A | Discharge status: Home / Self Care | Payer: Medicare Other | Attending: Emergency Medicine | Admitting: Emergency Medicine

## 2021-06-19 DIAGNOSIS — E861 Hypovolemia: Secondary | ICD-10-CM | POA: Diagnosis not present

## 2021-06-19 DIAGNOSIS — R52 Pain, unspecified: Secondary | ICD-10-CM | POA: Diagnosis not present

## 2021-06-19 DIAGNOSIS — R55 Syncope and collapse: Secondary | ICD-10-CM

## 2021-06-19 DIAGNOSIS — S0990XA Unspecified injury of head, initial encounter: Secondary | ICD-10-CM | POA: Diagnosis not present

## 2021-06-19 DIAGNOSIS — R064 Hyperventilation: Secondary | ICD-10-CM | POA: Diagnosis not present

## 2021-06-19 DIAGNOSIS — N186 End stage renal disease: Secondary | ICD-10-CM | POA: Insufficient documentation

## 2021-06-19 DIAGNOSIS — I132 Hypertensive heart and chronic kidney disease with heart failure and with stage 5 chronic kidney disease, or end stage renal disease: Secondary | ICD-10-CM | POA: Diagnosis not present

## 2021-06-19 DIAGNOSIS — D689 Coagulation defect, unspecified: Secondary | ICD-10-CM | POA: Diagnosis not present

## 2021-06-19 DIAGNOSIS — R404 Transient alteration of awareness: Secondary | ICD-10-CM | POA: Diagnosis not present

## 2021-06-19 DIAGNOSIS — R531 Weakness: Secondary | ICD-10-CM | POA: Diagnosis not present

## 2021-06-19 DIAGNOSIS — J45909 Unspecified asthma, uncomplicated: Secondary | ICD-10-CM | POA: Diagnosis not present

## 2021-06-19 DIAGNOSIS — Z992 Dependence on renal dialysis: Secondary | ICD-10-CM | POA: Insufficient documentation

## 2021-06-19 DIAGNOSIS — I2510 Atherosclerotic heart disease of native coronary artery without angina pectoris: Secondary | ICD-10-CM | POA: Insufficient documentation

## 2021-06-19 DIAGNOSIS — G4489 Other headache syndrome: Secondary | ICD-10-CM | POA: Diagnosis not present

## 2021-06-19 DIAGNOSIS — I959 Hypotension, unspecified: Secondary | ICD-10-CM | POA: Insufficient documentation

## 2021-06-19 DIAGNOSIS — F17210 Nicotine dependence, cigarettes, uncomplicated: Secondary | ICD-10-CM | POA: Insufficient documentation

## 2021-06-19 DIAGNOSIS — Z79899 Other long term (current) drug therapy: Secondary | ICD-10-CM | POA: Insufficient documentation

## 2021-06-19 DIAGNOSIS — D631 Anemia in chronic kidney disease: Secondary | ICD-10-CM | POA: Diagnosis not present

## 2021-06-19 DIAGNOSIS — N2581 Secondary hyperparathyroidism of renal origin: Secondary | ICD-10-CM | POA: Diagnosis not present

## 2021-06-19 DIAGNOSIS — Z23 Encounter for immunization: Secondary | ICD-10-CM | POA: Diagnosis not present

## 2021-06-19 DIAGNOSIS — I509 Heart failure, unspecified: Secondary | ICD-10-CM | POA: Insufficient documentation

## 2021-06-19 DIAGNOSIS — I120 Hypertensive chronic kidney disease with stage 5 chronic kidney disease or end stage renal disease: Secondary | ICD-10-CM | POA: Diagnosis not present

## 2021-06-19 DIAGNOSIS — R6889 Other general symptoms and signs: Secondary | ICD-10-CM | POA: Diagnosis not present

## 2021-06-19 DIAGNOSIS — Z743 Need for continuous supervision: Secondary | ICD-10-CM | POA: Diagnosis not present

## 2021-06-19 LAB — CBC WITH DIFFERENTIAL/PLATELET
Abs Immature Granulocytes: 0.02 10*3/uL (ref 0.00–0.07)
Basophils Absolute: 0 10*3/uL (ref 0.0–0.1)
Basophils Relative: 0 %
Eosinophils Absolute: 0.1 10*3/uL (ref 0.0–0.5)
Eosinophils Relative: 2 %
HCT: 30.8 % — ABNORMAL LOW (ref 36.0–46.0)
Hemoglobin: 9.8 g/dL — ABNORMAL LOW (ref 12.0–15.0)
Immature Granulocytes: 0 %
Lymphocytes Relative: 38 %
Lymphs Abs: 1.9 10*3/uL (ref 0.7–4.0)
MCH: 32.8 pg (ref 26.0–34.0)
MCHC: 31.8 g/dL (ref 30.0–36.0)
MCV: 103 fL — ABNORMAL HIGH (ref 80.0–100.0)
Monocytes Absolute: 0.8 10*3/uL (ref 0.1–1.0)
Monocytes Relative: 16 %
Neutro Abs: 2.2 10*3/uL (ref 1.7–7.7)
Neutrophils Relative %: 44 %
Platelets: 130 10*3/uL — ABNORMAL LOW (ref 150–400)
RBC: 2.99 MIL/uL — ABNORMAL LOW (ref 3.87–5.11)
RDW: 16.4 % — ABNORMAL HIGH (ref 11.5–15.5)
WBC: 5.1 10*3/uL (ref 4.0–10.5)
nRBC: 0.6 % — ABNORMAL HIGH (ref 0.0–0.2)

## 2021-06-19 LAB — RENAL FUNCTION PANEL
Albumin: 3.9 g/dL (ref 3.5–5.0)
Anion gap: 11 (ref 5–15)
BUN: 5 mg/dL — ABNORMAL LOW (ref 8–23)
CO2: 29 mmol/L (ref 22–32)
Calcium: 8.9 mg/dL (ref 8.9–10.3)
Chloride: 97 mmol/L — ABNORMAL LOW (ref 98–111)
Creatinine, Ser: 2.64 mg/dL — ABNORMAL HIGH (ref 0.44–1.00)
GFR, Estimated: 19 mL/min — ABNORMAL LOW (ref >60)
Glucose, Bld: 94 mg/dL (ref 70–99)
Phosphorus: 1.2 mg/dL — ABNORMAL LOW (ref 2.5–4.6)
Potassium: 3.6 mmol/L (ref 3.5–5.1)
Sodium: 137 mmol/L (ref 135–145)

## 2021-06-19 MED ORDER — LACTATED RINGERS IV BOLUS
250.0000 mL | Freq: Once | INTRAVENOUS | Status: AC
  Administered 2021-06-19: 250 mL via INTRAVENOUS

## 2021-06-19 NOTE — ED Notes (Signed)
Pt came from dialysis after LOC. Pt was brought to dialysis by transportation and reports she has no way of going home. SW notified for possible help with transportation back home. SW is coming. Pt is currently changing in room. Pt is alert and oriented x 4. Ambulatory.

## 2021-06-19 NOTE — ED Triage Notes (Signed)
Finished dialysis when pt passed out in the facility. Complained of headache, tachypnea and generalized weakness. Hypotensive with 76/40. IVF given in dialysis. New bp is 104/70. Alert and oriented x 4. CBG 96.

## 2021-06-19 NOTE — ED Notes (Signed)
Attempted IV twice with no success. IV team consult placed. Pt also has dialysis access that needs to be deaccessed by IV team.

## 2021-06-19 NOTE — ED Provider Notes (Signed)
[facility] Provider Note   CSN: [id number] Arrival date & time: 06/19/21  1231     History Chief Complaint  Patient presents with   Loss of Consciousness    [name] [name] is a 70 y.o. female.  [name] [name] is a 70 y.o. female with PMHx of ESRD on HD, HFrEF, CAD, HLD, Crohns disease s/p colostomy, who presents to the ED brought in by EMS from dialysis center for syncopal episode after completion of dialysis.  Had a reported blood pressure of 76/40 at the time.  She received a liter bolus with slight improvement to 104/70.  She does not recall passing.  She complains of a slight right-sided headache began earlier today.  States that she woke up feeling lightheaded and tired, last normal was last evening.  Also feels slight nausea and reports one episode of vomiting at dialysis ("it was clear"). She has had nothing to eat or drink today.  She reports that she started dialysis about 1 month ago.  Scheduled to have chest port removed next week. States that on Tuesday, she had a couple of her blood pressure medications were discontinued due to low blood pressures-she believes this was carvedilol and imdur.  She notes that her blood pressures are usually low 100s. She reports taking another medication "hydr-something" three times daily, last dose was yesterday.      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   Hyperlipidemia    Hypertension    Hypertensive retinopathy    OU   Osteopenia     Panic attacks    Reflux esophagitis    Seizure (HCC)    hx of   Tobacco abuse    Vitamin D deficiency     Patient Active Problem List   Diagnosis Date Noted   Acquired deformity of toenail 05/19/2021   ESRD (end stage renal disease) on dialysis ([place]) 04/25/2021   HFrEF (heart failure with reduced ejection fraction) ([place]) 03/27/2020   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   Right knee pain 11/02/2018   Eczema, dyshidrotic 05/04/2014   Hypomagnesemia 07/13/2013   Unspecified vitamin D deficiency 05/20/2013   Hypotension 02/08/2013   CAD (coronary artery disease) 05/30/2012   Tobacco abuse 05/30/2012   Anemia due to chronic kidney disease 05/30/2012   Osteopenia 10/05/2011   Atypical squamous cells of undetermined significance on cytologic smear of cervix (ASC-US) 09/21/2011   Left renal artery stenosis ([place]) 09/04/2011   History of seizure 03/13/2011   Numbness and tingling in right hand 11/18/2007   HYPERCHOLESTEROLEMIA 01/10/2007   Anxiety 09/02/2006   NEUROPATHY, PERIPHERAL 09/02/2006   Reflux esophagitis 09/02/2006   Regional enteritis ([place]) 09/02/2006    Past Surgical History:  Procedure Laterality Date   AV FISTULA PLACEMENT Right 04/28/2021   Procedure: RIGHT ARTERIOVENOUS (AV) GRAFT INSERTION USING 4-7MM x 45CM GORE-TEX GRAFT;  Surgeon: Angelia Mould, MD;  Location: [facility];  Service:  Vascular;  Laterality: Right;   CATARACT EXTRACTION Bilateral    Dr. Clent Jacks   CATARACT EXTRACTION W/ INTRAOCULAR LENS  IMPLANT, BILATERAL  ~ 2000   CHOLECYSTECTOMY  01/28/2005   COLOSTOMY  03/1996   diverting   ECTOPIC PREGNANCY SURGERY  ?1980's   left   EYE SURGERY Bilateral    Cat Sx - Dr. Clent Jacks   ILEOSTOMY  ?  2002   IR FLUORO GUIDE CV LINE RIGHT  04/25/2021   IR US GUIDE VASC ACCESS RIGHT  04/25/2021   NECK SURGERY  2020   "pinched nerve"     OB History     Gravida  2   Para  1   Term  1   Preterm  0   AB  1   Living  1       SAB  0   IAB  0   Ectopic  1   Multiple  0   Live Births              Family History  Problem Relation Age of Onset   Heart disease Mother    Other Mother        Covid   Glaucoma Mother    Stroke Father    Other Sister        AIDS    Social History   Tobacco Use   Smoking status: Every Day    Packs/day: 0.12    Years: 30.00    Pack years: 3.60    Types: Cigarettes   Smokeless tobacco: Never  Substance Use Topics   Alcohol use: No    Comment: 05/30/2012 "used to drink back in the day; last alcohol 23 yr ago"   Drug use: No    Home Medications Prior to Admission medications   Medication Sig Start Date End Date Taking? Authorizing Provider  Acetaminophen (TYLENOL ARTHRITIS PAIN PO) Take 1 tablet by mouth daily as needed (pain).   Yes [provider]  Ascorbic Acid (VITAMIN C) 500 MG CAPS Take 1 capsule by mouth daily. 04/30/21  Yes [provider]  atorvastatin (LIPITOR) 40 MG tablet Take 1 tablet (40 mg total) by mouth daily. 06/16/21  Yes Hensel, Jamal Collin, MD  Biotin 1000 MCG tablet Take 1,000 mcg by mouth daily.   Yes [provider]  LORazepam (ATIVAN) 0.5 MG tablet TAKE ONE TABLET BY MOUTH TWICE DAILY AS NEEDED FOR ANXIETY Patient taking differently: Take 0.5 mg by mouth every 6 (six) hours as needed for anxiety. 06/05/21  Yes Hensel, Jamal Collin, MD  Mesalamine 800 MG TBEC TAKE ONE TABLET BY MOUTH THREE TIMES DAILY Patient taking differently: Take 1 tablet by mouth in the morning, at noon, and at bedtime. 06/16/21  Yes Hensel, Jamal Collin, MD  pregabalin (LYRICA) 25 MG capsule Take 1 capsule (25 mg total) by mouth 2 (two) times daily. 06/16/21  Yes Hensel, Jamal Collin, MD  carvedilol (COREG) 6.25 MG tablet Take 1 tablet (6.25 mg total) by mouth 2 (two) times daily with a meal. Patient not taking: Reported on 06/19/2021 04/29/21   Alcus Dad, MD  isosorbide mononitrate (IMDUR) 30 MG 24 hr tablet Take 0.5 tablets (15 mg total) by  mouth daily. Patient not taking: Reported on 06/19/2021 05/19/21 05/14/22  Zenia Resides, MD  mirtazapine (REMERON) 15 MG tablet Take 1 tablet (15 mg total) by mouth at bedtime. Patient not taking: Reported on 06/19/2021 06/05/21   Zenia Resides, MD  Tuberculin PPD (TUBERSOL  ID) Inject into the skin. Patient not taking: Reported on 06/19/2021 05/01/21   [provider]    Allergies    Amoxicillin, Aspirin, Morphine and related, Penicillins, Gabapentin, Ciprofloxacin, and Ace inhibitors  Review of Systems   Review of Systems  Constitutional:  Negative for fever.  HENT:  Negative for sore throat.   Respiratory:  Positive for cough. Negative for shortness of breath.   Cardiovascular:  Positive for palpitations. Negative for chest pain.  Gastrointestinal:  Positive for nausea and vomiting.  Neurological:  Positive for headaches.   Physical Exam Updated Vital Signs BP (!) 94/48    Pulse (!) 104    Temp 98.8 F (37.1 C) (Oral)    Resp 20    Ht 5' 6"  (1.676 m)    Wt 59 kg    SpO2 100%    BMI 20.98 kg/m   Physical Exam HENT:     Head: Normocephalic.     Nose: Nose normal.     Mouth/Throat:     Mouth: Mucous membranes are dry.  Eyes:     Conjunctiva/sclera: Conjunctivae normal.  Cardiovascular:     Rate and Rhythm: Normal rate and regular rhythm.     Pulses: Normal pulses.     Heart sounds: No murmur heard. Pulmonary:     Effort: Pulmonary effort is normal. No respiratory distress.     Breath sounds: Normal breath sounds. No wheezing.  Abdominal:     Palpations: Abdomen is soft.     Tenderness: There is no abdominal tenderness.     Comments: Colostomy bag present  Musculoskeletal:     Cervical back: Neck supple.  Neurological:     Mental Status: She is oriented to person, place, and time.     Comments: CN 2-12 intact, normal FNF and alternating hand movements, 4/5 strength to right upper and lower extremities, 5/5 strength to left upper and lower extremities,  speech is clear and intact.  Psychiatric:     Comments: Anxious and tearful     ED Results / Procedures / Treatments   Labs (all labs ordered are listed, but only abnormal results are displayed) Labs Reviewed  CBC WITH DIFFERENTIAL/PLATELET - Abnormal; Notable for the following components:      Result Value   RBC 2.99 (*)    Hemoglobin 9.8 (*)    HCT 30.8 (*)    MCV 103.0 (*)    RDW 16.4 (*)    Platelets 130 (*)    nRBC 0.6 (*)    All other components within normal limits  RENAL FUNCTION PANEL - Abnormal; Notable for the following components:   Chloride 97 (*)    BUN 5 (*)    Creatinine, Ser 2.64 (*)    Phosphorus 1.2 (*)    GFR, Estimated 19 (*)    All other components within normal limits    EKG EKG Interpretation  Date/Time:  Thursday June 19 2021 12:39:42 EST Ventricular Rate:  92 PR Interval:  160 QRS Duration: 105 QT Interval:  388 QTC Calculation: 480 R Axis:   59 Text Interpretation: Sinus rhythm When compared to prior, similar appearance. No STEMI Confirmed by Antony Blackbird [phone]) on 06/19/2021 12:41:40 PM  Radiology CT HEAD WO CONTRAST (5MM)  Result Date: 06/19/2021 CLINICAL DATA:  Trauma, right-sided weakness EXAM: CT HEAD WITHOUT CONTRAST TECHNIQUE: Contiguous axial images were obtained from the base of the skull through the vertex without intravenous contrast. COMPARISON:  MR brain done on 04/13/2017 FINDINGS: Brain: Cavum septum pellucidum  and cavum septum vergae are seen. Ventricles are not dilated. There is no shift of midline structures. There are no epidural or subdural fluid collections. There are no signs of bleeding within the cranium. Vascular: Unremarkable. Skull: Unremarkable. Sinuses/Orbits: Unremarkable. Other: No significant interval changes are noted. IMPRESSION: There are no signs of bleeding within the cranium. No acute intracranial findings are seen. Cortical sulci are prominent suggesting atrophy. Electronically Signed   By: Elmer Picker [name]   On: 06/19/2021 14:02    Procedures Procedures   Medications Ordered in ED Medications  lactated ringers bolus 250 mL (250 mLs Intravenous New Bag/Given 06/19/21 1510)    ED Course  I have reviewed the triage vital signs and the nursing notes.  Pertinent labs & imaging results that were available during my care of the patient were reviewed by me and considered in my medical decision making (see chart for details).    MDM Rules/Calculators/A&P                         [name] is a 70 year old female with ESRD on HD who presents to the ED today status post syncopal episode after completing dialysis and hypotension (76/40).  Suspect orthostasis/hypovolemia in the setting of dialysis.  Received IV fluids at dialysis with improvement in blood pressures (104/70).  Differential for syncope includes hypovolemia, cardiac etiologies (arrhythmias, valvular abnormalities, MI), PE, vasovagal, drug-induced, hypoglycemia, seizure, TIA.  Suspect most likely hypovolemia in the setting of completed dialysis session given her hypotension that improved with IV fluids. Will give 250 mL bolus and reassess. Will obtain CT head given concern for LOC with fall and d/t her complaining of right-sided headache. Neuro examination was notable for 4/5 weakness to right arm and leg. Reports this is chronic from her previous neck surgery. Otherwise, neuro exam is unremarkable. EKG was unremarkable, doubt arrhythmia.   CBC with macrocytic anemia Hgb 9.8, MCV 103- this seems to be about baseline for patient. Thrombocytopenia with platelets 130, also appears chronic.  CT head with no signs of bleeding, no acute intracranial findings. RFP Cr 2.64, phos 1.2, BUN 5. 250 mL in process (patient was hard stick which caused delay).   1556: Patient feels improved. Has been able to eat and drink here without issues. Denies any lightheadedness or dizziness. Feels comfortable discharging home. Workup thus far reassuring.  BP still soft, 94/48. Suspect hypotension from hypovolemia as cause of her syncope. Recommended that she stay off her anti-hypertensive medications. Return precautions given. She will need transportation home but stable for d/c home.    Final Clinical Impression(s) / ED Diagnoses Final diagnoses:  Syncope, unspecified syncope type  ESRD (end stage renal disease) on dialysis ([place])  Hypotension due to hypovolemia    Rx / DC Orders ED Discharge Orders     None        Sharion Settler, DO 06/19/21 1559    Tegeler, Gwenyth Allegra, MD 06/22/21 1459

## 2021-06-19 NOTE — Discharge Instructions (Addendum)
We suspect that you fell due to your low-blood pressures. Your blood pressures improved after receiving some IV fluid. We are glad that you are feeling better. Your head imaging did not show any acute stroke or any new findings, which is good. Do not take any blood pressure medications. Let dialysis manage your blood pressures for now.   Return for worsening headaches, confusion, vision changes, chest pain or heart palpitations.   Schedule an appointment with your PCP, Dr. Andria Frames, for follow up.

## 2021-06-19 NOTE — ED Notes (Signed)
Finished dialysis treatment today when pt passed out while sitting in dialysis chair. Pt was hypotensive in the 65R systolic. Dialysis center gave 1L IVF. Pt is now alert and oriented x 4. Cardiac monitoring in place.

## 2021-06-21 DIAGNOSIS — D631 Anemia in chronic kidney disease: Secondary | ICD-10-CM | POA: Diagnosis not present

## 2021-06-21 DIAGNOSIS — D689 Coagulation defect, unspecified: Secondary | ICD-10-CM | POA: Diagnosis not present

## 2021-06-21 DIAGNOSIS — Z992 Dependence on renal dialysis: Secondary | ICD-10-CM | POA: Diagnosis not present

## 2021-06-21 DIAGNOSIS — N2581 Secondary hyperparathyroidism of renal origin: Secondary | ICD-10-CM | POA: Diagnosis not present

## 2021-06-21 DIAGNOSIS — N186 End stage renal disease: Secondary | ICD-10-CM | POA: Diagnosis not present

## 2021-06-21 DIAGNOSIS — Z23 Encounter for immunization: Secondary | ICD-10-CM | POA: Diagnosis not present

## 2021-06-21 DIAGNOSIS — R52 Pain, unspecified: Secondary | ICD-10-CM | POA: Diagnosis not present

## 2021-06-24 DIAGNOSIS — N186 End stage renal disease: Secondary | ICD-10-CM | POA: Diagnosis not present

## 2021-06-24 DIAGNOSIS — Z23 Encounter for immunization: Secondary | ICD-10-CM | POA: Diagnosis not present

## 2021-06-24 DIAGNOSIS — N2581 Secondary hyperparathyroidism of renal origin: Secondary | ICD-10-CM | POA: Diagnosis not present

## 2021-06-24 DIAGNOSIS — R52 Pain, unspecified: Secondary | ICD-10-CM | POA: Diagnosis not present

## 2021-06-24 DIAGNOSIS — D631 Anemia in chronic kidney disease: Secondary | ICD-10-CM | POA: Diagnosis not present

## 2021-06-24 DIAGNOSIS — Z992 Dependence on renal dialysis: Secondary | ICD-10-CM | POA: Diagnosis not present

## 2021-06-24 DIAGNOSIS — D689 Coagulation defect, unspecified: Secondary | ICD-10-CM | POA: Diagnosis not present

## 2021-06-26 DIAGNOSIS — N186 End stage renal disease: Secondary | ICD-10-CM | POA: Diagnosis not present

## 2021-06-26 DIAGNOSIS — Z23 Encounter for immunization: Secondary | ICD-10-CM | POA: Diagnosis not present

## 2021-06-26 DIAGNOSIS — Z992 Dependence on renal dialysis: Secondary | ICD-10-CM | POA: Diagnosis not present

## 2021-06-26 DIAGNOSIS — R52 Pain, unspecified: Secondary | ICD-10-CM | POA: Diagnosis not present

## 2021-06-26 DIAGNOSIS — D631 Anemia in chronic kidney disease: Secondary | ICD-10-CM | POA: Diagnosis not present

## 2021-06-26 DIAGNOSIS — N2581 Secondary hyperparathyroidism of renal origin: Secondary | ICD-10-CM | POA: Diagnosis not present

## 2021-06-26 DIAGNOSIS — D689 Coagulation defect, unspecified: Secondary | ICD-10-CM | POA: Diagnosis not present

## 2021-06-28 DIAGNOSIS — D689 Coagulation defect, unspecified: Secondary | ICD-10-CM | POA: Diagnosis not present

## 2021-06-28 DIAGNOSIS — D631 Anemia in chronic kidney disease: Secondary | ICD-10-CM | POA: Diagnosis not present

## 2021-06-28 DIAGNOSIS — Z992 Dependence on renal dialysis: Secondary | ICD-10-CM | POA: Diagnosis not present

## 2021-06-28 DIAGNOSIS — Z23 Encounter for immunization: Secondary | ICD-10-CM | POA: Diagnosis not present

## 2021-06-28 DIAGNOSIS — N2581 Secondary hyperparathyroidism of renal origin: Secondary | ICD-10-CM | POA: Diagnosis not present

## 2021-06-28 DIAGNOSIS — R52 Pain, unspecified: Secondary | ICD-10-CM | POA: Diagnosis not present

## 2021-06-28 DIAGNOSIS — N186 End stage renal disease: Secondary | ICD-10-CM | POA: Diagnosis not present

## 2021-07-01 ENCOUNTER — Other Ambulatory Visit: Payer: Self-pay | Admitting: Cardiovascular Disease

## 2021-07-01 ENCOUNTER — Other Ambulatory Visit: Payer: Self-pay | Admitting: Family Medicine

## 2021-07-01 DIAGNOSIS — R52 Pain, unspecified: Secondary | ICD-10-CM | POA: Diagnosis not present

## 2021-07-01 DIAGNOSIS — D689 Coagulation defect, unspecified: Secondary | ICD-10-CM | POA: Diagnosis not present

## 2021-07-01 DIAGNOSIS — N2581 Secondary hyperparathyroidism of renal origin: Secondary | ICD-10-CM | POA: Diagnosis not present

## 2021-07-01 DIAGNOSIS — Z23 Encounter for immunization: Secondary | ICD-10-CM | POA: Diagnosis not present

## 2021-07-01 DIAGNOSIS — N186 End stage renal disease: Secondary | ICD-10-CM | POA: Diagnosis not present

## 2021-07-01 DIAGNOSIS — Z992 Dependence on renal dialysis: Secondary | ICD-10-CM | POA: Diagnosis not present

## 2021-07-01 DIAGNOSIS — D631 Anemia in chronic kidney disease: Secondary | ICD-10-CM | POA: Diagnosis not present

## 2021-07-01 DIAGNOSIS — I5020 Unspecified systolic (congestive) heart failure: Secondary | ICD-10-CM

## 2021-07-03 DIAGNOSIS — Z992 Dependence on renal dialysis: Secondary | ICD-10-CM | POA: Diagnosis not present

## 2021-07-03 DIAGNOSIS — Z23 Encounter for immunization: Secondary | ICD-10-CM | POA: Diagnosis not present

## 2021-07-03 DIAGNOSIS — N186 End stage renal disease: Secondary | ICD-10-CM | POA: Diagnosis not present

## 2021-07-03 DIAGNOSIS — D631 Anemia in chronic kidney disease: Secondary | ICD-10-CM | POA: Diagnosis not present

## 2021-07-03 DIAGNOSIS — D689 Coagulation defect, unspecified: Secondary | ICD-10-CM | POA: Diagnosis not present

## 2021-07-03 DIAGNOSIS — N2581 Secondary hyperparathyroidism of renal origin: Secondary | ICD-10-CM | POA: Diagnosis not present

## 2021-07-03 DIAGNOSIS — R52 Pain, unspecified: Secondary | ICD-10-CM | POA: Diagnosis not present

## 2021-07-04 DIAGNOSIS — Z452 Encounter for adjustment and management of vascular access device: Secondary | ICD-10-CM | POA: Diagnosis not present

## 2021-07-04 DIAGNOSIS — N186 End stage renal disease: Secondary | ICD-10-CM | POA: Diagnosis not present

## 2021-07-04 DIAGNOSIS — Z992 Dependence on renal dialysis: Secondary | ICD-10-CM | POA: Diagnosis not present

## 2021-07-05 DIAGNOSIS — N2581 Secondary hyperparathyroidism of renal origin: Secondary | ICD-10-CM | POA: Diagnosis not present

## 2021-07-05 DIAGNOSIS — I129 Hypertensive chronic kidney disease with stage 1 through stage 4 chronic kidney disease, or unspecified chronic kidney disease: Secondary | ICD-10-CM | POA: Diagnosis not present

## 2021-07-05 DIAGNOSIS — N186 End stage renal disease: Secondary | ICD-10-CM | POA: Diagnosis not present

## 2021-07-05 DIAGNOSIS — R52 Pain, unspecified: Secondary | ICD-10-CM | POA: Diagnosis not present

## 2021-07-05 DIAGNOSIS — D689 Coagulation defect, unspecified: Secondary | ICD-10-CM | POA: Diagnosis not present

## 2021-07-05 DIAGNOSIS — Z23 Encounter for immunization: Secondary | ICD-10-CM | POA: Diagnosis not present

## 2021-07-05 DIAGNOSIS — D631 Anemia in chronic kidney disease: Secondary | ICD-10-CM | POA: Diagnosis not present

## 2021-07-05 DIAGNOSIS — Z992 Dependence on renal dialysis: Secondary | ICD-10-CM | POA: Diagnosis not present

## 2021-07-08 DIAGNOSIS — D631 Anemia in chronic kidney disease: Secondary | ICD-10-CM | POA: Diagnosis not present

## 2021-07-08 DIAGNOSIS — R52 Pain, unspecified: Secondary | ICD-10-CM | POA: Diagnosis not present

## 2021-07-08 DIAGNOSIS — Z992 Dependence on renal dialysis: Secondary | ICD-10-CM | POA: Diagnosis not present

## 2021-07-08 DIAGNOSIS — Z23 Encounter for immunization: Secondary | ICD-10-CM | POA: Diagnosis not present

## 2021-07-08 DIAGNOSIS — N2581 Secondary hyperparathyroidism of renal origin: Secondary | ICD-10-CM | POA: Diagnosis not present

## 2021-07-08 DIAGNOSIS — N186 End stage renal disease: Secondary | ICD-10-CM | POA: Diagnosis not present

## 2021-07-10 DIAGNOSIS — R52 Pain, unspecified: Secondary | ICD-10-CM | POA: Diagnosis not present

## 2021-07-10 DIAGNOSIS — N2581 Secondary hyperparathyroidism of renal origin: Secondary | ICD-10-CM | POA: Diagnosis not present

## 2021-07-10 DIAGNOSIS — Z23 Encounter for immunization: Secondary | ICD-10-CM | POA: Diagnosis not present

## 2021-07-10 DIAGNOSIS — D631 Anemia in chronic kidney disease: Secondary | ICD-10-CM | POA: Diagnosis not present

## 2021-07-10 DIAGNOSIS — Z992 Dependence on renal dialysis: Secondary | ICD-10-CM | POA: Diagnosis not present

## 2021-07-10 DIAGNOSIS — N186 End stage renal disease: Secondary | ICD-10-CM | POA: Diagnosis not present

## 2021-07-12 DIAGNOSIS — Z992 Dependence on renal dialysis: Secondary | ICD-10-CM | POA: Diagnosis not present

## 2021-07-12 DIAGNOSIS — D631 Anemia in chronic kidney disease: Secondary | ICD-10-CM | POA: Diagnosis not present

## 2021-07-12 DIAGNOSIS — Z23 Encounter for immunization: Secondary | ICD-10-CM | POA: Diagnosis not present

## 2021-07-12 DIAGNOSIS — N186 End stage renal disease: Secondary | ICD-10-CM | POA: Diagnosis not present

## 2021-07-12 DIAGNOSIS — N2581 Secondary hyperparathyroidism of renal origin: Secondary | ICD-10-CM | POA: Diagnosis not present

## 2021-07-12 DIAGNOSIS — R52 Pain, unspecified: Secondary | ICD-10-CM | POA: Diagnosis not present

## 2021-07-15 DIAGNOSIS — Z992 Dependence on renal dialysis: Secondary | ICD-10-CM | POA: Diagnosis not present

## 2021-07-15 DIAGNOSIS — D631 Anemia in chronic kidney disease: Secondary | ICD-10-CM | POA: Diagnosis not present

## 2021-07-15 DIAGNOSIS — R52 Pain, unspecified: Secondary | ICD-10-CM | POA: Diagnosis not present

## 2021-07-15 DIAGNOSIS — Z23 Encounter for immunization: Secondary | ICD-10-CM | POA: Diagnosis not present

## 2021-07-15 DIAGNOSIS — N2581 Secondary hyperparathyroidism of renal origin: Secondary | ICD-10-CM | POA: Diagnosis not present

## 2021-07-15 DIAGNOSIS — N186 End stage renal disease: Secondary | ICD-10-CM | POA: Diagnosis not present

## 2021-07-17 DIAGNOSIS — N186 End stage renal disease: Secondary | ICD-10-CM | POA: Diagnosis not present

## 2021-07-17 DIAGNOSIS — Z992 Dependence on renal dialysis: Secondary | ICD-10-CM | POA: Diagnosis not present

## 2021-07-17 DIAGNOSIS — R52 Pain, unspecified: Secondary | ICD-10-CM | POA: Diagnosis not present

## 2021-07-17 DIAGNOSIS — N2581 Secondary hyperparathyroidism of renal origin: Secondary | ICD-10-CM | POA: Diagnosis not present

## 2021-07-17 DIAGNOSIS — D631 Anemia in chronic kidney disease: Secondary | ICD-10-CM | POA: Diagnosis not present

## 2021-07-17 DIAGNOSIS — Z23 Encounter for immunization: Secondary | ICD-10-CM | POA: Diagnosis not present

## 2021-07-19 DIAGNOSIS — D631 Anemia in chronic kidney disease: Secondary | ICD-10-CM | POA: Diagnosis not present

## 2021-07-19 DIAGNOSIS — N186 End stage renal disease: Secondary | ICD-10-CM | POA: Diagnosis not present

## 2021-07-19 DIAGNOSIS — R52 Pain, unspecified: Secondary | ICD-10-CM | POA: Diagnosis not present

## 2021-07-19 DIAGNOSIS — Z23 Encounter for immunization: Secondary | ICD-10-CM | POA: Diagnosis not present

## 2021-07-19 DIAGNOSIS — Z992 Dependence on renal dialysis: Secondary | ICD-10-CM | POA: Diagnosis not present

## 2021-07-19 DIAGNOSIS — N2581 Secondary hyperparathyroidism of renal origin: Secondary | ICD-10-CM | POA: Diagnosis not present

## 2021-07-22 DIAGNOSIS — D631 Anemia in chronic kidney disease: Secondary | ICD-10-CM | POA: Diagnosis not present

## 2021-07-22 DIAGNOSIS — R52 Pain, unspecified: Secondary | ICD-10-CM | POA: Diagnosis not present

## 2021-07-22 DIAGNOSIS — N2581 Secondary hyperparathyroidism of renal origin: Secondary | ICD-10-CM | POA: Diagnosis not present

## 2021-07-22 DIAGNOSIS — N186 End stage renal disease: Secondary | ICD-10-CM | POA: Diagnosis not present

## 2021-07-22 DIAGNOSIS — Z992 Dependence on renal dialysis: Secondary | ICD-10-CM | POA: Diagnosis not present

## 2021-07-22 DIAGNOSIS — Z23 Encounter for immunization: Secondary | ICD-10-CM | POA: Diagnosis not present

## 2021-07-24 DIAGNOSIS — R52 Pain, unspecified: Secondary | ICD-10-CM | POA: Diagnosis not present

## 2021-07-24 DIAGNOSIS — Z23 Encounter for immunization: Secondary | ICD-10-CM | POA: Diagnosis not present

## 2021-07-24 DIAGNOSIS — N186 End stage renal disease: Secondary | ICD-10-CM | POA: Diagnosis not present

## 2021-07-24 DIAGNOSIS — N2581 Secondary hyperparathyroidism of renal origin: Secondary | ICD-10-CM | POA: Diagnosis not present

## 2021-07-24 DIAGNOSIS — Z992 Dependence on renal dialysis: Secondary | ICD-10-CM | POA: Diagnosis not present

## 2021-07-24 DIAGNOSIS — D631 Anemia in chronic kidney disease: Secondary | ICD-10-CM | POA: Diagnosis not present

## 2021-07-26 DIAGNOSIS — R52 Pain, unspecified: Secondary | ICD-10-CM | POA: Diagnosis not present

## 2021-07-26 DIAGNOSIS — Z992 Dependence on renal dialysis: Secondary | ICD-10-CM | POA: Diagnosis not present

## 2021-07-26 DIAGNOSIS — D631 Anemia in chronic kidney disease: Secondary | ICD-10-CM | POA: Diagnosis not present

## 2021-07-26 DIAGNOSIS — Z23 Encounter for immunization: Secondary | ICD-10-CM | POA: Diagnosis not present

## 2021-07-26 DIAGNOSIS — N186 End stage renal disease: Secondary | ICD-10-CM | POA: Diagnosis not present

## 2021-07-26 DIAGNOSIS — N2581 Secondary hyperparathyroidism of renal origin: Secondary | ICD-10-CM | POA: Diagnosis not present

## 2021-07-28 ENCOUNTER — Other Ambulatory Visit: Payer: Self-pay | Admitting: Cardiovascular Disease

## 2021-07-29 DIAGNOSIS — Z992 Dependence on renal dialysis: Secondary | ICD-10-CM | POA: Diagnosis not present

## 2021-07-29 DIAGNOSIS — D631 Anemia in chronic kidney disease: Secondary | ICD-10-CM | POA: Diagnosis not present

## 2021-07-29 DIAGNOSIS — R52 Pain, unspecified: Secondary | ICD-10-CM | POA: Diagnosis not present

## 2021-07-29 DIAGNOSIS — Z23 Encounter for immunization: Secondary | ICD-10-CM | POA: Diagnosis not present

## 2021-07-29 DIAGNOSIS — N2581 Secondary hyperparathyroidism of renal origin: Secondary | ICD-10-CM | POA: Diagnosis not present

## 2021-07-29 DIAGNOSIS — N186 End stage renal disease: Secondary | ICD-10-CM | POA: Diagnosis not present

## 2021-07-31 DIAGNOSIS — R52 Pain, unspecified: Secondary | ICD-10-CM | POA: Diagnosis not present

## 2021-07-31 DIAGNOSIS — Z992 Dependence on renal dialysis: Secondary | ICD-10-CM | POA: Diagnosis not present

## 2021-07-31 DIAGNOSIS — N2581 Secondary hyperparathyroidism of renal origin: Secondary | ICD-10-CM | POA: Diagnosis not present

## 2021-07-31 DIAGNOSIS — D631 Anemia in chronic kidney disease: Secondary | ICD-10-CM | POA: Diagnosis not present

## 2021-07-31 DIAGNOSIS — Z23 Encounter for immunization: Secondary | ICD-10-CM | POA: Diagnosis not present

## 2021-07-31 DIAGNOSIS — N186 End stage renal disease: Secondary | ICD-10-CM | POA: Diagnosis not present

## 2021-08-02 DIAGNOSIS — Z23 Encounter for immunization: Secondary | ICD-10-CM | POA: Diagnosis not present

## 2021-08-02 DIAGNOSIS — N186 End stage renal disease: Secondary | ICD-10-CM | POA: Diagnosis not present

## 2021-08-02 DIAGNOSIS — N2581 Secondary hyperparathyroidism of renal origin: Secondary | ICD-10-CM | POA: Diagnosis not present

## 2021-08-02 DIAGNOSIS — Z992 Dependence on renal dialysis: Secondary | ICD-10-CM | POA: Diagnosis not present

## 2021-08-02 DIAGNOSIS — D631 Anemia in chronic kidney disease: Secondary | ICD-10-CM | POA: Diagnosis not present

## 2021-08-02 DIAGNOSIS — R52 Pain, unspecified: Secondary | ICD-10-CM | POA: Diagnosis not present

## 2021-08-04 ENCOUNTER — Other Ambulatory Visit: Payer: Self-pay | Admitting: Family Medicine

## 2021-08-04 ENCOUNTER — Other Ambulatory Visit: Payer: Self-pay | Admitting: Cardiovascular Disease

## 2021-08-04 DIAGNOSIS — I2510 Atherosclerotic heart disease of native coronary artery without angina pectoris: Secondary | ICD-10-CM

## 2021-08-05 DIAGNOSIS — N2581 Secondary hyperparathyroidism of renal origin: Secondary | ICD-10-CM | POA: Diagnosis not present

## 2021-08-05 DIAGNOSIS — I129 Hypertensive chronic kidney disease with stage 1 through stage 4 chronic kidney disease, or unspecified chronic kidney disease: Secondary | ICD-10-CM | POA: Diagnosis not present

## 2021-08-05 DIAGNOSIS — Z992 Dependence on renal dialysis: Secondary | ICD-10-CM | POA: Diagnosis not present

## 2021-08-05 DIAGNOSIS — D631 Anemia in chronic kidney disease: Secondary | ICD-10-CM | POA: Diagnosis not present

## 2021-08-05 DIAGNOSIS — N186 End stage renal disease: Secondary | ICD-10-CM | POA: Diagnosis not present

## 2021-08-05 DIAGNOSIS — R52 Pain, unspecified: Secondary | ICD-10-CM | POA: Diagnosis not present

## 2021-08-05 DIAGNOSIS — Z23 Encounter for immunization: Secondary | ICD-10-CM | POA: Diagnosis not present

## 2021-08-07 DIAGNOSIS — N186 End stage renal disease: Secondary | ICD-10-CM | POA: Diagnosis not present

## 2021-08-07 DIAGNOSIS — Z992 Dependence on renal dialysis: Secondary | ICD-10-CM | POA: Diagnosis not present

## 2021-08-07 DIAGNOSIS — N2581 Secondary hyperparathyroidism of renal origin: Secondary | ICD-10-CM | POA: Diagnosis not present

## 2021-08-07 DIAGNOSIS — Z23 Encounter for immunization: Secondary | ICD-10-CM | POA: Diagnosis not present

## 2021-08-07 DIAGNOSIS — D631 Anemia in chronic kidney disease: Secondary | ICD-10-CM | POA: Diagnosis not present

## 2021-08-07 DIAGNOSIS — D509 Iron deficiency anemia, unspecified: Secondary | ICD-10-CM | POA: Diagnosis not present

## 2021-08-09 DIAGNOSIS — D509 Iron deficiency anemia, unspecified: Secondary | ICD-10-CM | POA: Diagnosis not present

## 2021-08-09 DIAGNOSIS — N186 End stage renal disease: Secondary | ICD-10-CM | POA: Diagnosis not present

## 2021-08-09 DIAGNOSIS — D631 Anemia in chronic kidney disease: Secondary | ICD-10-CM | POA: Diagnosis not present

## 2021-08-09 DIAGNOSIS — Z992 Dependence on renal dialysis: Secondary | ICD-10-CM | POA: Diagnosis not present

## 2021-08-09 DIAGNOSIS — Z23 Encounter for immunization: Secondary | ICD-10-CM | POA: Diagnosis not present

## 2021-08-09 DIAGNOSIS — N2581 Secondary hyperparathyroidism of renal origin: Secondary | ICD-10-CM | POA: Diagnosis not present

## 2021-08-12 ENCOUNTER — Other Ambulatory Visit: Payer: Self-pay | Admitting: Cardiovascular Disease

## 2021-08-12 DIAGNOSIS — D631 Anemia in chronic kidney disease: Secondary | ICD-10-CM | POA: Diagnosis not present

## 2021-08-12 DIAGNOSIS — N186 End stage renal disease: Secondary | ICD-10-CM | POA: Diagnosis not present

## 2021-08-12 DIAGNOSIS — N2581 Secondary hyperparathyroidism of renal origin: Secondary | ICD-10-CM | POA: Diagnosis not present

## 2021-08-12 DIAGNOSIS — Z992 Dependence on renal dialysis: Secondary | ICD-10-CM | POA: Diagnosis not present

## 2021-08-12 DIAGNOSIS — D509 Iron deficiency anemia, unspecified: Secondary | ICD-10-CM | POA: Diagnosis not present

## 2021-08-12 DIAGNOSIS — Z23 Encounter for immunization: Secondary | ICD-10-CM | POA: Diagnosis not present

## 2021-08-12 NOTE — Telephone Encounter (Signed)
Hydralazine (Apresoline) 25 MG The original prescription was discontinued on 04/29/2021 by Alcus Dad, MD for the following reason: Stop Taking at Discharge. Renewing this prescription may not be appropriate.

## 2021-08-14 DIAGNOSIS — N186 End stage renal disease: Secondary | ICD-10-CM | POA: Diagnosis not present

## 2021-08-14 DIAGNOSIS — Z23 Encounter for immunization: Secondary | ICD-10-CM | POA: Diagnosis not present

## 2021-08-14 DIAGNOSIS — D631 Anemia in chronic kidney disease: Secondary | ICD-10-CM | POA: Diagnosis not present

## 2021-08-14 DIAGNOSIS — Z992 Dependence on renal dialysis: Secondary | ICD-10-CM | POA: Diagnosis not present

## 2021-08-14 DIAGNOSIS — N2581 Secondary hyperparathyroidism of renal origin: Secondary | ICD-10-CM | POA: Diagnosis not present

## 2021-08-14 DIAGNOSIS — D509 Iron deficiency anemia, unspecified: Secondary | ICD-10-CM | POA: Diagnosis not present

## 2021-08-16 DIAGNOSIS — N2581 Secondary hyperparathyroidism of renal origin: Secondary | ICD-10-CM | POA: Diagnosis not present

## 2021-08-16 DIAGNOSIS — N186 End stage renal disease: Secondary | ICD-10-CM | POA: Diagnosis not present

## 2021-08-16 DIAGNOSIS — D631 Anemia in chronic kidney disease: Secondary | ICD-10-CM | POA: Diagnosis not present

## 2021-08-16 DIAGNOSIS — D509 Iron deficiency anemia, unspecified: Secondary | ICD-10-CM | POA: Diagnosis not present

## 2021-08-16 DIAGNOSIS — Z992 Dependence on renal dialysis: Secondary | ICD-10-CM | POA: Diagnosis not present

## 2021-08-16 DIAGNOSIS — Z23 Encounter for immunization: Secondary | ICD-10-CM | POA: Diagnosis not present

## 2021-08-19 DIAGNOSIS — N186 End stage renal disease: Secondary | ICD-10-CM | POA: Diagnosis not present

## 2021-08-19 DIAGNOSIS — N2581 Secondary hyperparathyroidism of renal origin: Secondary | ICD-10-CM | POA: Diagnosis not present

## 2021-08-19 DIAGNOSIS — D631 Anemia in chronic kidney disease: Secondary | ICD-10-CM | POA: Diagnosis not present

## 2021-08-19 DIAGNOSIS — Z992 Dependence on renal dialysis: Secondary | ICD-10-CM | POA: Diagnosis not present

## 2021-08-19 DIAGNOSIS — Z23 Encounter for immunization: Secondary | ICD-10-CM | POA: Diagnosis not present

## 2021-08-19 DIAGNOSIS — D509 Iron deficiency anemia, unspecified: Secondary | ICD-10-CM | POA: Diagnosis not present

## 2021-08-21 DIAGNOSIS — D631 Anemia in chronic kidney disease: Secondary | ICD-10-CM | POA: Diagnosis not present

## 2021-08-21 DIAGNOSIS — Z23 Encounter for immunization: Secondary | ICD-10-CM | POA: Diagnosis not present

## 2021-08-21 DIAGNOSIS — D509 Iron deficiency anemia, unspecified: Secondary | ICD-10-CM | POA: Diagnosis not present

## 2021-08-21 DIAGNOSIS — Z992 Dependence on renal dialysis: Secondary | ICD-10-CM | POA: Diagnosis not present

## 2021-08-21 DIAGNOSIS — N186 End stage renal disease: Secondary | ICD-10-CM | POA: Diagnosis not present

## 2021-08-21 DIAGNOSIS — N2581 Secondary hyperparathyroidism of renal origin: Secondary | ICD-10-CM | POA: Diagnosis not present

## 2021-08-23 DIAGNOSIS — Z992 Dependence on renal dialysis: Secondary | ICD-10-CM | POA: Diagnosis not present

## 2021-08-23 DIAGNOSIS — N2581 Secondary hyperparathyroidism of renal origin: Secondary | ICD-10-CM | POA: Diagnosis not present

## 2021-08-23 DIAGNOSIS — N186 End stage renal disease: Secondary | ICD-10-CM | POA: Diagnosis not present

## 2021-08-23 DIAGNOSIS — D631 Anemia in chronic kidney disease: Secondary | ICD-10-CM | POA: Diagnosis not present

## 2021-08-23 DIAGNOSIS — Z23 Encounter for immunization: Secondary | ICD-10-CM | POA: Diagnosis not present

## 2021-08-23 DIAGNOSIS — D509 Iron deficiency anemia, unspecified: Secondary | ICD-10-CM | POA: Diagnosis not present

## 2021-08-26 DIAGNOSIS — D631 Anemia in chronic kidney disease: Secondary | ICD-10-CM | POA: Diagnosis not present

## 2021-08-26 DIAGNOSIS — Z23 Encounter for immunization: Secondary | ICD-10-CM | POA: Diagnosis not present

## 2021-08-26 DIAGNOSIS — D509 Iron deficiency anemia, unspecified: Secondary | ICD-10-CM | POA: Diagnosis not present

## 2021-08-26 DIAGNOSIS — N186 End stage renal disease: Secondary | ICD-10-CM | POA: Diagnosis not present

## 2021-08-26 DIAGNOSIS — Z992 Dependence on renal dialysis: Secondary | ICD-10-CM | POA: Diagnosis not present

## 2021-08-26 DIAGNOSIS — N2581 Secondary hyperparathyroidism of renal origin: Secondary | ICD-10-CM | POA: Diagnosis not present

## 2021-08-28 DIAGNOSIS — N2581 Secondary hyperparathyroidism of renal origin: Secondary | ICD-10-CM | POA: Diagnosis not present

## 2021-08-28 DIAGNOSIS — D509 Iron deficiency anemia, unspecified: Secondary | ICD-10-CM | POA: Diagnosis not present

## 2021-08-28 DIAGNOSIS — Z992 Dependence on renal dialysis: Secondary | ICD-10-CM | POA: Diagnosis not present

## 2021-08-28 DIAGNOSIS — Z23 Encounter for immunization: Secondary | ICD-10-CM | POA: Diagnosis not present

## 2021-08-28 DIAGNOSIS — D631 Anemia in chronic kidney disease: Secondary | ICD-10-CM | POA: Diagnosis not present

## 2021-08-28 DIAGNOSIS — N186 End stage renal disease: Secondary | ICD-10-CM | POA: Diagnosis not present

## 2021-08-30 DIAGNOSIS — N186 End stage renal disease: Secondary | ICD-10-CM | POA: Diagnosis not present

## 2021-08-30 DIAGNOSIS — D631 Anemia in chronic kidney disease: Secondary | ICD-10-CM | POA: Diagnosis not present

## 2021-08-30 DIAGNOSIS — D509 Iron deficiency anemia, unspecified: Secondary | ICD-10-CM | POA: Diagnosis not present

## 2021-08-30 DIAGNOSIS — Z23 Encounter for immunization: Secondary | ICD-10-CM | POA: Diagnosis not present

## 2021-08-30 DIAGNOSIS — N2581 Secondary hyperparathyroidism of renal origin: Secondary | ICD-10-CM | POA: Diagnosis not present

## 2021-08-30 DIAGNOSIS — Z992 Dependence on renal dialysis: Secondary | ICD-10-CM | POA: Diagnosis not present

## 2021-09-02 DIAGNOSIS — Z23 Encounter for immunization: Secondary | ICD-10-CM | POA: Diagnosis not present

## 2021-09-02 DIAGNOSIS — N2581 Secondary hyperparathyroidism of renal origin: Secondary | ICD-10-CM | POA: Diagnosis not present

## 2021-09-02 DIAGNOSIS — N186 End stage renal disease: Secondary | ICD-10-CM | POA: Diagnosis not present

## 2021-09-02 DIAGNOSIS — D509 Iron deficiency anemia, unspecified: Secondary | ICD-10-CM | POA: Diagnosis not present

## 2021-09-02 DIAGNOSIS — D631 Anemia in chronic kidney disease: Secondary | ICD-10-CM | POA: Diagnosis not present

## 2021-09-02 DIAGNOSIS — Z992 Dependence on renal dialysis: Secondary | ICD-10-CM | POA: Diagnosis not present

## 2021-09-02 DIAGNOSIS — I129 Hypertensive chronic kidney disease with stage 1 through stage 4 chronic kidney disease, or unspecified chronic kidney disease: Secondary | ICD-10-CM | POA: Diagnosis not present

## 2021-09-04 DIAGNOSIS — D631 Anemia in chronic kidney disease: Secondary | ICD-10-CM | POA: Diagnosis not present

## 2021-09-04 DIAGNOSIS — D509 Iron deficiency anemia, unspecified: Secondary | ICD-10-CM | POA: Diagnosis not present

## 2021-09-04 DIAGNOSIS — N186 End stage renal disease: Secondary | ICD-10-CM | POA: Diagnosis not present

## 2021-09-04 DIAGNOSIS — N2581 Secondary hyperparathyroidism of renal origin: Secondary | ICD-10-CM | POA: Diagnosis not present

## 2021-09-04 DIAGNOSIS — R52 Pain, unspecified: Secondary | ICD-10-CM | POA: Diagnosis not present

## 2021-09-04 DIAGNOSIS — Z992 Dependence on renal dialysis: Secondary | ICD-10-CM | POA: Diagnosis not present

## 2021-09-06 DIAGNOSIS — N2581 Secondary hyperparathyroidism of renal origin: Secondary | ICD-10-CM | POA: Diagnosis not present

## 2021-09-06 DIAGNOSIS — N186 End stage renal disease: Secondary | ICD-10-CM | POA: Diagnosis not present

## 2021-09-06 DIAGNOSIS — Z992 Dependence on renal dialysis: Secondary | ICD-10-CM | POA: Diagnosis not present

## 2021-09-06 DIAGNOSIS — D509 Iron deficiency anemia, unspecified: Secondary | ICD-10-CM | POA: Diagnosis not present

## 2021-09-06 DIAGNOSIS — D631 Anemia in chronic kidney disease: Secondary | ICD-10-CM | POA: Diagnosis not present

## 2021-09-06 DIAGNOSIS — R52 Pain, unspecified: Secondary | ICD-10-CM | POA: Diagnosis not present

## 2021-09-09 DIAGNOSIS — N186 End stage renal disease: Secondary | ICD-10-CM | POA: Diagnosis not present

## 2021-09-09 DIAGNOSIS — N2581 Secondary hyperparathyroidism of renal origin: Secondary | ICD-10-CM | POA: Diagnosis not present

## 2021-09-09 DIAGNOSIS — R52 Pain, unspecified: Secondary | ICD-10-CM | POA: Diagnosis not present

## 2021-09-09 DIAGNOSIS — D631 Anemia in chronic kidney disease: Secondary | ICD-10-CM | POA: Diagnosis not present

## 2021-09-09 DIAGNOSIS — Z992 Dependence on renal dialysis: Secondary | ICD-10-CM | POA: Diagnosis not present

## 2021-09-09 DIAGNOSIS — D509 Iron deficiency anemia, unspecified: Secondary | ICD-10-CM | POA: Diagnosis not present

## 2021-09-11 ENCOUNTER — Telehealth: Payer: Self-pay

## 2021-09-11 DIAGNOSIS — N2581 Secondary hyperparathyroidism of renal origin: Secondary | ICD-10-CM | POA: Diagnosis not present

## 2021-09-11 DIAGNOSIS — R52 Pain, unspecified: Secondary | ICD-10-CM | POA: Diagnosis not present

## 2021-09-11 DIAGNOSIS — D631 Anemia in chronic kidney disease: Secondary | ICD-10-CM | POA: Diagnosis not present

## 2021-09-11 DIAGNOSIS — N186 End stage renal disease: Secondary | ICD-10-CM | POA: Diagnosis not present

## 2021-09-11 DIAGNOSIS — Z992 Dependence on renal dialysis: Secondary | ICD-10-CM | POA: Diagnosis not present

## 2021-09-11 DIAGNOSIS — D509 Iron deficiency anemia, unspecified: Secondary | ICD-10-CM | POA: Diagnosis not present

## 2021-09-11 NOTE — Telephone Encounter (Signed)
Patient contacted to schedule mammogram.  ? ?RE: Mobile Mammo event located at: ? ?[facility]  ?      ?[address]    ?[place] [address]    ? ?Date: April 7th  ? ? ?

## 2021-09-13 DIAGNOSIS — D509 Iron deficiency anemia, unspecified: Secondary | ICD-10-CM | POA: Diagnosis not present

## 2021-09-13 DIAGNOSIS — Z992 Dependence on renal dialysis: Secondary | ICD-10-CM | POA: Diagnosis not present

## 2021-09-13 DIAGNOSIS — N2581 Secondary hyperparathyroidism of renal origin: Secondary | ICD-10-CM | POA: Diagnosis not present

## 2021-09-13 DIAGNOSIS — N186 End stage renal disease: Secondary | ICD-10-CM | POA: Diagnosis not present

## 2021-09-13 DIAGNOSIS — R52 Pain, unspecified: Secondary | ICD-10-CM | POA: Diagnosis not present

## 2021-09-13 DIAGNOSIS — D631 Anemia in chronic kidney disease: Secondary | ICD-10-CM | POA: Diagnosis not present

## 2021-09-15 DIAGNOSIS — Z933 Colostomy status: Secondary | ICD-10-CM | POA: Diagnosis not present

## 2021-09-15 DIAGNOSIS — K5090 Crohn's disease, unspecified, without complications: Secondary | ICD-10-CM | POA: Diagnosis not present

## 2021-09-16 DIAGNOSIS — R52 Pain, unspecified: Secondary | ICD-10-CM | POA: Diagnosis not present

## 2021-09-16 DIAGNOSIS — D509 Iron deficiency anemia, unspecified: Secondary | ICD-10-CM | POA: Diagnosis not present

## 2021-09-16 DIAGNOSIS — N186 End stage renal disease: Secondary | ICD-10-CM | POA: Diagnosis not present

## 2021-09-16 DIAGNOSIS — N2581 Secondary hyperparathyroidism of renal origin: Secondary | ICD-10-CM | POA: Diagnosis not present

## 2021-09-16 DIAGNOSIS — D631 Anemia in chronic kidney disease: Secondary | ICD-10-CM | POA: Diagnosis not present

## 2021-09-16 DIAGNOSIS — Z992 Dependence on renal dialysis: Secondary | ICD-10-CM | POA: Diagnosis not present

## 2021-09-18 DIAGNOSIS — N2581 Secondary hyperparathyroidism of renal origin: Secondary | ICD-10-CM | POA: Diagnosis not present

## 2021-09-18 DIAGNOSIS — Z992 Dependence on renal dialysis: Secondary | ICD-10-CM | POA: Diagnosis not present

## 2021-09-18 DIAGNOSIS — D631 Anemia in chronic kidney disease: Secondary | ICD-10-CM | POA: Diagnosis not present

## 2021-09-18 DIAGNOSIS — D509 Iron deficiency anemia, unspecified: Secondary | ICD-10-CM | POA: Diagnosis not present

## 2021-09-18 DIAGNOSIS — N186 End stage renal disease: Secondary | ICD-10-CM | POA: Diagnosis not present

## 2021-09-18 DIAGNOSIS — R52 Pain, unspecified: Secondary | ICD-10-CM | POA: Diagnosis not present

## 2021-09-19 DIAGNOSIS — T82858A Stenosis of vascular prosthetic devices, implants and grafts, initial encounter: Secondary | ICD-10-CM | POA: Diagnosis not present

## 2021-09-19 DIAGNOSIS — N186 End stage renal disease: Secondary | ICD-10-CM | POA: Diagnosis not present

## 2021-09-19 DIAGNOSIS — Z992 Dependence on renal dialysis: Secondary | ICD-10-CM | POA: Diagnosis not present

## 2021-09-19 DIAGNOSIS — I871 Compression of vein: Secondary | ICD-10-CM | POA: Diagnosis not present

## 2021-09-20 DIAGNOSIS — N186 End stage renal disease: Secondary | ICD-10-CM | POA: Diagnosis not present

## 2021-09-20 DIAGNOSIS — N2581 Secondary hyperparathyroidism of renal origin: Secondary | ICD-10-CM | POA: Diagnosis not present

## 2021-09-20 DIAGNOSIS — Z992 Dependence on renal dialysis: Secondary | ICD-10-CM | POA: Diagnosis not present

## 2021-09-20 DIAGNOSIS — D631 Anemia in chronic kidney disease: Secondary | ICD-10-CM | POA: Diagnosis not present

## 2021-09-20 DIAGNOSIS — R52 Pain, unspecified: Secondary | ICD-10-CM | POA: Diagnosis not present

## 2021-09-20 DIAGNOSIS — D509 Iron deficiency anemia, unspecified: Secondary | ICD-10-CM | POA: Diagnosis not present

## 2021-09-22 ENCOUNTER — Other Ambulatory Visit: Payer: Self-pay

## 2021-09-22 ENCOUNTER — Encounter: Payer: Self-pay | Admitting: Family Medicine

## 2021-09-22 ENCOUNTER — Ambulatory Visit (INDEPENDENT_AMBULATORY_CARE_PROVIDER_SITE_OTHER): Payer: Medicare Other | Admitting: Family Medicine

## 2021-09-22 VITALS — BP 112/54 | HR 103 | Ht 66.0 in | Wt 130.2 lb

## 2021-09-22 DIAGNOSIS — I5020 Unspecified systolic (congestive) heart failure: Secondary | ICD-10-CM | POA: Diagnosis not present

## 2021-09-22 DIAGNOSIS — Z1239 Encounter for other screening for malignant neoplasm of breast: Secondary | ICD-10-CM | POA: Diagnosis not present

## 2021-09-22 DIAGNOSIS — N186 End stage renal disease: Secondary | ICD-10-CM

## 2021-09-22 DIAGNOSIS — K50013 Crohn's disease of small intestine with fistula: Secondary | ICD-10-CM | POA: Diagnosis not present

## 2021-09-22 DIAGNOSIS — Z992 Dependence on renal dialysis: Secondary | ICD-10-CM | POA: Diagnosis not present

## 2021-09-22 DIAGNOSIS — F419 Anxiety disorder, unspecified: Secondary | ICD-10-CM | POA: Diagnosis not present

## 2021-09-22 DIAGNOSIS — I959 Hypotension, unspecified: Secondary | ICD-10-CM

## 2021-09-22 DIAGNOSIS — F411 Generalized anxiety disorder: Secondary | ICD-10-CM

## 2021-09-22 MED ORDER — CITALOPRAM HYDROBROMIDE 10 MG PO TABS: 10.0000 mg | ORAL_TABLET | Freq: Every day | ORAL | 3 refills | Status: DC

## 2021-09-22 MED ORDER — LORAZEPAM 0.5 MG PO TABS: 0.5000 mg | ORAL_TABLET | Freq: Two times a day (BID) | ORAL | 5 refills | Status: DC | PRN

## 2021-09-22 MED ORDER — MIRTAZAPINE 15 MG PO TABS: 15.0000 mg | ORAL_TABLET | Freq: Every day | ORAL | 3 refills | Status: DC

## 2021-09-22 NOTE — Patient Instructions (Addendum)
You seem to be doing very well on dialysis. ?I order a echocardiogram to see if your heart has gotten stronger. ?I refilled your medications. ?The only change that I made was to decrease the dos of one of your anxiety medications.   ?I also ordered a mammogram.  You are overdue.   ?

## 2021-09-23 ENCOUNTER — Encounter: Payer: Self-pay | Admitting: Family Medicine

## 2021-09-23 DIAGNOSIS — D509 Iron deficiency anemia, unspecified: Secondary | ICD-10-CM | POA: Diagnosis not present

## 2021-09-23 DIAGNOSIS — D631 Anemia in chronic kidney disease: Secondary | ICD-10-CM | POA: Diagnosis not present

## 2021-09-23 DIAGNOSIS — N2581 Secondary hyperparathyroidism of renal origin: Secondary | ICD-10-CM | POA: Diagnosis not present

## 2021-09-23 DIAGNOSIS — R52 Pain, unspecified: Secondary | ICD-10-CM | POA: Diagnosis not present

## 2021-09-23 DIAGNOSIS — N186 End stage renal disease: Secondary | ICD-10-CM | POA: Diagnosis not present

## 2021-09-23 DIAGNOSIS — Z992 Dependence on renal dialysis: Secondary | ICD-10-CM | POA: Diagnosis not present

## 2021-09-23 NOTE — Assessment & Plan Note (Signed)
Will recheck echo.  With her lack of symptoms off meds and on dialysis to control volume, I wonder if she has improved cardiac function.   ?

## 2021-09-23 NOTE — Assessment & Plan Note (Signed)
Stable on current meds 

## 2021-09-23 NOTE — Progress Notes (Signed)
? ? ?  SUBJECTIVE:  ? ?CHIEF COMPLAINT / HPI:  ? ?Multiple issues ?ESRD now on dialysis.  Patient is settling into dialysis nicely.  She feels better and has more energy since she began dialysis.  She has some frustration about the time leading up to dialysis.  She did not feel doctors, especially her [facility] nephrologist adaquately warned her about the severity of her CKD.  "I quit going because he told me things were OK."  I apologized for my part in not being clear to her. ?Hypotension.  Renal has "taken me off of all my blood pressure meds." Thorough med rec done and list is right.  She was on meds not only for BP but more so for HFrEF.  See next.  Even off meds dialysis takes longer and she needs to sit afterward for low BPs. ?HFrEF.  Not on any core measure drugs at present due to hypotension.  Nicely, she does not complain of any ankle swelling or orthopnea.  Last echo was two years ago.  With her lack of sx, I wonder if she has had some recovery. ?Had c spine surg.  Less numbness and better arm strength in both upper extremities.   ?Anxiety is at her stable self.  On med rec, we discovered she is taking both mirtazipine and citalopram.  And her lorazepam.   ?Regional enteritis is stable.  Minimal sx on current meds.  Weight is stable and healthy for her. ? ? ? ?OBJECTIVE:  ? ?BP (!) 112/54   Pulse (!) 103   Ht 5' 6"  (1.676 m)   Wt 130 lb 3.2 oz (59.1 kg)   SpO2 95%   BMI 21.01 kg/m?   ?VS including BP and weight noted.   ?Lungs clear ?Cardiac RRR without m or g ?Ext no edema ? ?ASSESSMENT/PLAN:  ? ?Anxiety ?Decrease citalopram from 20 to 10 mg daily. ? ?Screening for breast cancer ?Recommended mammogram.  Order placed. ? ?Hypotension ?Low BPs at dialysis force [place] to decrease HFrEF meds.   ? ?HFrEF (heart failure with reduced ejection fraction) ([place]) ?Will recheck echo.  With her lack of symptoms off meds and on dialysis to control volume, I wonder if she has improved cardiac function.   ? ?Regional  enteritis ?Stable on current meds. ? ?ESRD (end stage renal disease) on dialysis [facility]) ?She is pleased with improved energy now that she is on dialysis. ?  ? ? ?Zenia Resides, MD ?[place]  ?

## 2021-09-23 NOTE — Assessment & Plan Note (Signed)
Decrease citalopram from 20 to 10 mg daily. ?

## 2021-09-23 NOTE — Assessment & Plan Note (Signed)
Recommended mammogram.  Order placed. ?

## 2021-09-23 NOTE — Assessment & Plan Note (Signed)
She is pleased with improved energy now that she is on dialysis. ?

## 2021-09-23 NOTE — Assessment & Plan Note (Signed)
Low BPs at dialysis force [place] to decrease HFrEF meds.   ?

## 2021-09-25 DIAGNOSIS — R52 Pain, unspecified: Secondary | ICD-10-CM | POA: Diagnosis not present

## 2021-09-25 DIAGNOSIS — N2581 Secondary hyperparathyroidism of renal origin: Secondary | ICD-10-CM | POA: Diagnosis not present

## 2021-09-25 DIAGNOSIS — N186 End stage renal disease: Secondary | ICD-10-CM | POA: Diagnosis not present

## 2021-09-25 DIAGNOSIS — Z992 Dependence on renal dialysis: Secondary | ICD-10-CM | POA: Diagnosis not present

## 2021-09-25 DIAGNOSIS — D509 Iron deficiency anemia, unspecified: Secondary | ICD-10-CM | POA: Diagnosis not present

## 2021-09-25 DIAGNOSIS — D631 Anemia in chronic kidney disease: Secondary | ICD-10-CM | POA: Diagnosis not present

## 2021-09-26 ENCOUNTER — Other Ambulatory Visit: Payer: Self-pay

## 2021-09-26 ENCOUNTER — Ambulatory Visit (HOSPITAL_COMMUNITY)
Admission: RE | Admit: 2021-09-26 | Discharge: 2021-09-26 | Disposition: A | Discharge status: Home / Self Care | Payer: Medicare Other | Source: Ambulatory Visit | Attending: Family Medicine | Admitting: Family Medicine

## 2021-09-26 DIAGNOSIS — Z720 Tobacco use: Secondary | ICD-10-CM | POA: Insufficient documentation

## 2021-09-26 DIAGNOSIS — E785 Hyperlipidemia, unspecified: Secondary | ICD-10-CM | POA: Diagnosis not present

## 2021-09-26 DIAGNOSIS — I5022 Chronic systolic (congestive) heart failure: Secondary | ICD-10-CM | POA: Insufficient documentation

## 2021-09-26 DIAGNOSIS — I5020 Unspecified systolic (congestive) heart failure: Secondary | ICD-10-CM

## 2021-09-26 DIAGNOSIS — I080 Rheumatic disorders of both mitral and aortic valves: Secondary | ICD-10-CM | POA: Insufficient documentation

## 2021-09-26 DIAGNOSIS — I110 Hypertensive heart disease with heart failure: Secondary | ICD-10-CM | POA: Insufficient documentation

## 2021-09-26 DIAGNOSIS — Z8679 Personal history of other diseases of the circulatory system: Secondary | ICD-10-CM | POA: Diagnosis not present

## 2021-09-26 NOTE — Progress Notes (Signed)
?  Echocardiogram ?2D Echocardiogram has been performed. ? ?Elmer Ramp ?09/26/2021, 3:53 PM ?

## 2021-09-27 DIAGNOSIS — Z992 Dependence on renal dialysis: Secondary | ICD-10-CM | POA: Diagnosis not present

## 2021-09-27 DIAGNOSIS — D631 Anemia in chronic kidney disease: Secondary | ICD-10-CM | POA: Diagnosis not present

## 2021-09-27 DIAGNOSIS — N186 End stage renal disease: Secondary | ICD-10-CM | POA: Diagnosis not present

## 2021-09-27 DIAGNOSIS — D509 Iron deficiency anemia, unspecified: Secondary | ICD-10-CM | POA: Diagnosis not present

## 2021-09-27 DIAGNOSIS — N2581 Secondary hyperparathyroidism of renal origin: Secondary | ICD-10-CM | POA: Diagnosis not present

## 2021-09-27 DIAGNOSIS — R52 Pain, unspecified: Secondary | ICD-10-CM | POA: Diagnosis not present

## 2021-09-30 DIAGNOSIS — Z992 Dependence on renal dialysis: Secondary | ICD-10-CM | POA: Diagnosis not present

## 2021-09-30 DIAGNOSIS — D509 Iron deficiency anemia, unspecified: Secondary | ICD-10-CM | POA: Diagnosis not present

## 2021-09-30 DIAGNOSIS — N2581 Secondary hyperparathyroidism of renal origin: Secondary | ICD-10-CM | POA: Diagnosis not present

## 2021-09-30 DIAGNOSIS — R52 Pain, unspecified: Secondary | ICD-10-CM | POA: Diagnosis not present

## 2021-09-30 DIAGNOSIS — D631 Anemia in chronic kidney disease: Secondary | ICD-10-CM | POA: Diagnosis not present

## 2021-09-30 DIAGNOSIS — N186 End stage renal disease: Secondary | ICD-10-CM | POA: Diagnosis not present

## 2021-10-01 ENCOUNTER — Ambulatory Visit
Admission: RE | Admit: 2021-10-01 | Discharge: 2021-10-01 | Disposition: A | Discharge status: Home / Self Care | Payer: Medicare Other | Source: Ambulatory Visit | Attending: Family Medicine | Admitting: Family Medicine

## 2021-10-01 DIAGNOSIS — Z1231 Encounter for screening mammogram for malignant neoplasm of breast: Secondary | ICD-10-CM | POA: Diagnosis not present

## 2021-10-01 DIAGNOSIS — Z1239 Encounter for other screening for malignant neoplasm of breast: Secondary | ICD-10-CM

## 2021-10-02 DIAGNOSIS — N186 End stage renal disease: Secondary | ICD-10-CM | POA: Diagnosis not present

## 2021-10-02 DIAGNOSIS — N2581 Secondary hyperparathyroidism of renal origin: Secondary | ICD-10-CM | POA: Diagnosis not present

## 2021-10-02 DIAGNOSIS — Z992 Dependence on renal dialysis: Secondary | ICD-10-CM | POA: Diagnosis not present

## 2021-10-03 DIAGNOSIS — Z992 Dependence on renal dialysis: Secondary | ICD-10-CM | POA: Diagnosis not present

## 2021-10-03 DIAGNOSIS — N186 End stage renal disease: Secondary | ICD-10-CM | POA: Diagnosis not present

## 2021-10-03 DIAGNOSIS — I129 Hypertensive chronic kidney disease with stage 1 through stage 4 chronic kidney disease, or unspecified chronic kidney disease: Secondary | ICD-10-CM | POA: Diagnosis not present

## 2021-10-04 DIAGNOSIS — N2581 Secondary hyperparathyroidism of renal origin: Secondary | ICD-10-CM | POA: Diagnosis not present

## 2021-10-04 DIAGNOSIS — Z23 Encounter for immunization: Secondary | ICD-10-CM | POA: Diagnosis not present

## 2021-10-04 DIAGNOSIS — Z992 Dependence on renal dialysis: Secondary | ICD-10-CM | POA: Diagnosis not present

## 2021-10-04 DIAGNOSIS — N186 End stage renal disease: Secondary | ICD-10-CM | POA: Diagnosis not present

## 2021-10-05 LAB — ECHOCARDIOGRAM COMPLETE
AR max vel: 1.78 cm2
AV Area VTI: 1.78 cm2
AV Area mean vel: 1.73 cm2
AV Mean grad: 9 mmHg
AV Peak grad: 17.1 mmHg
Ao pk vel: 2.07 m/s
P 1/2 time: 567 msec
S' Lateral: 2.8 cm

## 2021-10-06 ENCOUNTER — Telehealth: Payer: Self-pay | Admitting: Family Medicine

## 2021-10-06 DIAGNOSIS — I5020 Unspecified systolic (congestive) heart failure: Secondary | ICD-10-CM

## 2021-10-06 NOTE — Telephone Encounter (Signed)
Called and LM of excellent recovery of cardiac function.  No longer has HFrEF.  Only has grade 1 diastolic dysfunction.   ?

## 2021-10-07 DIAGNOSIS — N186 End stage renal disease: Secondary | ICD-10-CM | POA: Diagnosis not present

## 2021-10-07 DIAGNOSIS — Z992 Dependence on renal dialysis: Secondary | ICD-10-CM | POA: Diagnosis not present

## 2021-10-07 DIAGNOSIS — Z23 Encounter for immunization: Secondary | ICD-10-CM | POA: Diagnosis not present

## 2021-10-07 DIAGNOSIS — N2581 Secondary hyperparathyroidism of renal origin: Secondary | ICD-10-CM | POA: Diagnosis not present

## 2021-10-09 DIAGNOSIS — N186 End stage renal disease: Secondary | ICD-10-CM | POA: Diagnosis not present

## 2021-10-09 DIAGNOSIS — N2581 Secondary hyperparathyroidism of renal origin: Secondary | ICD-10-CM | POA: Diagnosis not present

## 2021-10-09 DIAGNOSIS — Z23 Encounter for immunization: Secondary | ICD-10-CM | POA: Diagnosis not present

## 2021-10-09 DIAGNOSIS — Z992 Dependence on renal dialysis: Secondary | ICD-10-CM | POA: Diagnosis not present

## 2021-10-09 NOTE — Telephone Encounter (Signed)
Final attempt to schedule for mobile mammo event.  ?

## 2021-10-11 DIAGNOSIS — Z992 Dependence on renal dialysis: Secondary | ICD-10-CM | POA: Diagnosis not present

## 2021-10-11 DIAGNOSIS — N2581 Secondary hyperparathyroidism of renal origin: Secondary | ICD-10-CM | POA: Diagnosis not present

## 2021-10-11 DIAGNOSIS — N186 End stage renal disease: Secondary | ICD-10-CM | POA: Diagnosis not present

## 2021-10-11 DIAGNOSIS — Z23 Encounter for immunization: Secondary | ICD-10-CM | POA: Diagnosis not present

## 2021-10-14 DIAGNOSIS — Z992 Dependence on renal dialysis: Secondary | ICD-10-CM | POA: Diagnosis not present

## 2021-10-14 DIAGNOSIS — Z23 Encounter for immunization: Secondary | ICD-10-CM | POA: Diagnosis not present

## 2021-10-14 DIAGNOSIS — N186 End stage renal disease: Secondary | ICD-10-CM | POA: Diagnosis not present

## 2021-10-14 DIAGNOSIS — N2581 Secondary hyperparathyroidism of renal origin: Secondary | ICD-10-CM | POA: Diagnosis not present

## 2021-10-16 DIAGNOSIS — Z992 Dependence on renal dialysis: Secondary | ICD-10-CM | POA: Diagnosis not present

## 2021-10-16 DIAGNOSIS — N186 End stage renal disease: Secondary | ICD-10-CM | POA: Diagnosis not present

## 2021-10-16 DIAGNOSIS — N2581 Secondary hyperparathyroidism of renal origin: Secondary | ICD-10-CM | POA: Diagnosis not present

## 2021-10-16 DIAGNOSIS — Z23 Encounter for immunization: Secondary | ICD-10-CM | POA: Diagnosis not present

## 2021-10-18 DIAGNOSIS — N186 End stage renal disease: Secondary | ICD-10-CM | POA: Diagnosis not present

## 2021-10-18 DIAGNOSIS — N2581 Secondary hyperparathyroidism of renal origin: Secondary | ICD-10-CM | POA: Diagnosis not present

## 2021-10-18 DIAGNOSIS — Z23 Encounter for immunization: Secondary | ICD-10-CM | POA: Diagnosis not present

## 2021-10-18 DIAGNOSIS — Z992 Dependence on renal dialysis: Secondary | ICD-10-CM | POA: Diagnosis not present

## 2021-10-21 DIAGNOSIS — N186 End stage renal disease: Secondary | ICD-10-CM | POA: Diagnosis not present

## 2021-10-21 DIAGNOSIS — Z992 Dependence on renal dialysis: Secondary | ICD-10-CM | POA: Diagnosis not present

## 2021-10-21 DIAGNOSIS — N2581 Secondary hyperparathyroidism of renal origin: Secondary | ICD-10-CM | POA: Diagnosis not present

## 2021-10-21 DIAGNOSIS — Z23 Encounter for immunization: Secondary | ICD-10-CM | POA: Diagnosis not present

## 2021-10-23 DIAGNOSIS — N2581 Secondary hyperparathyroidism of renal origin: Secondary | ICD-10-CM | POA: Diagnosis not present

## 2021-10-23 DIAGNOSIS — N186 End stage renal disease: Secondary | ICD-10-CM | POA: Diagnosis not present

## 2021-10-23 DIAGNOSIS — Z992 Dependence on renal dialysis: Secondary | ICD-10-CM | POA: Diagnosis not present

## 2021-10-23 DIAGNOSIS — Z23 Encounter for immunization: Secondary | ICD-10-CM | POA: Diagnosis not present

## 2021-10-25 DIAGNOSIS — Z23 Encounter for immunization: Secondary | ICD-10-CM | POA: Diagnosis not present

## 2021-10-25 DIAGNOSIS — N186 End stage renal disease: Secondary | ICD-10-CM | POA: Diagnosis not present

## 2021-10-25 DIAGNOSIS — Z992 Dependence on renal dialysis: Secondary | ICD-10-CM | POA: Diagnosis not present

## 2021-10-25 DIAGNOSIS — N2581 Secondary hyperparathyroidism of renal origin: Secondary | ICD-10-CM | POA: Diagnosis not present

## 2021-10-27 ENCOUNTER — Ambulatory Visit (INDEPENDENT_AMBULATORY_CARE_PROVIDER_SITE_OTHER): Payer: Medicare Other | Admitting: Family Medicine

## 2021-10-27 ENCOUNTER — Encounter: Payer: Self-pay | Admitting: Family Medicine

## 2021-10-27 ENCOUNTER — Ambulatory Visit
Admission: RE | Admit: 2021-10-27 | Discharge: 2021-10-27 | Disposition: A | Discharge status: Home / Self Care | Payer: Medicare Other | Source: Ambulatory Visit | Attending: Family Medicine | Admitting: Family Medicine

## 2021-10-27 DIAGNOSIS — M5136 Other intervertebral disc degeneration, lumbar region: Secondary | ICD-10-CM | POA: Diagnosis not present

## 2021-10-27 DIAGNOSIS — M545 Low back pain, unspecified: Secondary | ICD-10-CM | POA: Diagnosis not present

## 2021-10-27 DIAGNOSIS — K50013 Crohn's disease of small intestine with fistula: Secondary | ICD-10-CM | POA: Diagnosis not present

## 2021-10-27 DIAGNOSIS — I5020 Unspecified systolic (congestive) heart failure: Secondary | ICD-10-CM

## 2021-10-27 DIAGNOSIS — R922 Inconclusive mammogram: Secondary | ICD-10-CM | POA: Diagnosis not present

## 2021-10-27 NOTE — Patient Instructions (Signed)
I am pleased about your heart ?Your breasts are fine - just make sure you get your mammograms every year ?I will call you when I get the Xray results of your back.  We can talk about treatment when I call.   ?Great seeing you.   ?

## 2021-10-28 ENCOUNTER — Encounter: Payer: Self-pay | Admitting: Family Medicine

## 2021-10-28 DIAGNOSIS — N2581 Secondary hyperparathyroidism of renal origin: Secondary | ICD-10-CM | POA: Diagnosis not present

## 2021-10-28 DIAGNOSIS — N186 End stage renal disease: Secondary | ICD-10-CM | POA: Diagnosis not present

## 2021-10-28 DIAGNOSIS — Z23 Encounter for immunization: Secondary | ICD-10-CM | POA: Diagnosis not present

## 2021-10-28 DIAGNOSIS — R922 Inconclusive mammogram: Secondary | ICD-10-CM | POA: Insufficient documentation

## 2021-10-28 DIAGNOSIS — Z992 Dependence on renal dialysis: Secondary | ICD-10-CM | POA: Diagnosis not present

## 2021-10-28 NOTE — Assessment & Plan Note (Signed)
Recovered CHF.   ?

## 2021-10-28 NOTE — Progress Notes (Signed)
? ? ?  SUBJECTIVE:  ? ?CHIEF COMPLAINT / HPI: ? ?Several issues: ?Hx of HFrEF. Asymptomatic.  Now normal EF 60-65%  Only has grade one diastolic dysfunction. ?Had questions about dense breasts seen on mammogram ?Fell, slipped in bathroom and banged bottom a couple of weeks ago.  Has known Cervical spine disease and had recent surgery.  Know recent low back imaging.  Feels pain in bilateral lower lumber region.  Feels skin irregularity at point of impact on buttocks.  Also C/O bilateral leg tightness.  Fall was mechanical.  Did not pass out, does not feel light headed. ? ? ?OBJECTIVE:  ? ?BP (!) 123/59   Pulse (!) 101   Wt 131 lb 9.6 oz (59.7 kg)   SpO2 98%   BMI 21.24 kg/m?   ?Lungs clear ?Cardiac RRR without m or g ?Bilateral lower lumbar paraspinous muscle tenderness. ?Skin shows several small chronic non infected fisulas. ? ? ?ASSESSMENT/PLAN:  ? ?HFrEF (heart failure with reduced ejection fraction) ([place]) ?Recovered CHF.   ? ?Dense breasts ?Encouraged annual mammograms (as opposed to q 2y)  Changed prompt on health maint tab ? ?Low back pain ?Plain films show mild degenerative changes, which were certainly aggrevated by the fall. ? 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   ?  ? ? ?Zenia Resides, MD ?[place]  ?

## 2021-10-28 NOTE — Assessment & Plan Note (Signed)
Encouraged annual mammograms (as opposed to q 2y)  Changed prompt on health maint tab ?

## 2021-10-28 NOTE — Assessment & Plan Note (Signed)
Plain films show mild degenerative changes, which were certainly aggrevated by the fall. ?

## 2021-10-28 NOTE — Assessment & Plan Note (Signed)
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   ?

## 2021-10-30 DIAGNOSIS — Z992 Dependence on renal dialysis: Secondary | ICD-10-CM | POA: Diagnosis not present

## 2021-10-30 DIAGNOSIS — N2581 Secondary hyperparathyroidism of renal origin: Secondary | ICD-10-CM | POA: Diagnosis not present

## 2021-10-30 DIAGNOSIS — N186 End stage renal disease: Secondary | ICD-10-CM | POA: Diagnosis not present

## 2021-10-30 DIAGNOSIS — Z23 Encounter for immunization: Secondary | ICD-10-CM | POA: Diagnosis not present

## 2021-11-01 DIAGNOSIS — N2581 Secondary hyperparathyroidism of renal origin: Secondary | ICD-10-CM | POA: Diagnosis not present

## 2021-11-01 DIAGNOSIS — N186 End stage renal disease: Secondary | ICD-10-CM | POA: Diagnosis not present

## 2021-11-01 DIAGNOSIS — Z992 Dependence on renal dialysis: Secondary | ICD-10-CM | POA: Diagnosis not present

## 2021-11-01 DIAGNOSIS — Z23 Encounter for immunization: Secondary | ICD-10-CM | POA: Diagnosis not present

## 2021-11-02 DIAGNOSIS — Z992 Dependence on renal dialysis: Secondary | ICD-10-CM | POA: Diagnosis not present

## 2021-11-02 DIAGNOSIS — I129 Hypertensive chronic kidney disease with stage 1 through stage 4 chronic kidney disease, or unspecified chronic kidney disease: Secondary | ICD-10-CM | POA: Diagnosis not present

## 2021-11-02 DIAGNOSIS — N186 End stage renal disease: Secondary | ICD-10-CM | POA: Diagnosis not present

## 2021-11-04 DIAGNOSIS — N2581 Secondary hyperparathyroidism of renal origin: Secondary | ICD-10-CM | POA: Diagnosis not present

## 2021-11-04 DIAGNOSIS — Z992 Dependence on renal dialysis: Secondary | ICD-10-CM | POA: Diagnosis not present

## 2021-11-04 DIAGNOSIS — N186 End stage renal disease: Secondary | ICD-10-CM | POA: Diagnosis not present

## 2021-11-06 DIAGNOSIS — Z992 Dependence on renal dialysis: Secondary | ICD-10-CM | POA: Diagnosis not present

## 2021-11-06 DIAGNOSIS — N186 End stage renal disease: Secondary | ICD-10-CM | POA: Diagnosis not present

## 2021-11-06 DIAGNOSIS — N2581 Secondary hyperparathyroidism of renal origin: Secondary | ICD-10-CM | POA: Diagnosis not present

## 2021-11-08 DIAGNOSIS — N186 End stage renal disease: Secondary | ICD-10-CM | POA: Diagnosis not present

## 2021-11-08 DIAGNOSIS — Z992 Dependence on renal dialysis: Secondary | ICD-10-CM | POA: Diagnosis not present

## 2021-11-08 DIAGNOSIS — N2581 Secondary hyperparathyroidism of renal origin: Secondary | ICD-10-CM | POA: Diagnosis not present

## 2021-11-11 DIAGNOSIS — N186 End stage renal disease: Secondary | ICD-10-CM | POA: Diagnosis not present

## 2021-11-11 DIAGNOSIS — N2581 Secondary hyperparathyroidism of renal origin: Secondary | ICD-10-CM | POA: Diagnosis not present

## 2021-11-11 DIAGNOSIS — Z992 Dependence on renal dialysis: Secondary | ICD-10-CM | POA: Diagnosis not present

## 2021-11-13 DIAGNOSIS — N2581 Secondary hyperparathyroidism of renal origin: Secondary | ICD-10-CM | POA: Diagnosis not present

## 2021-11-13 DIAGNOSIS — Z992 Dependence on renal dialysis: Secondary | ICD-10-CM | POA: Diagnosis not present

## 2021-11-13 DIAGNOSIS — N186 End stage renal disease: Secondary | ICD-10-CM | POA: Diagnosis not present

## 2021-11-15 DIAGNOSIS — Z992 Dependence on renal dialysis: Secondary | ICD-10-CM | POA: Diagnosis not present

## 2021-11-15 DIAGNOSIS — N2581 Secondary hyperparathyroidism of renal origin: Secondary | ICD-10-CM | POA: Diagnosis not present

## 2021-11-15 DIAGNOSIS — N186 End stage renal disease: Secondary | ICD-10-CM | POA: Diagnosis not present

## 2021-11-18 ENCOUNTER — Other Ambulatory Visit: Payer: Self-pay | Admitting: Family Medicine

## 2021-11-18 DIAGNOSIS — N2581 Secondary hyperparathyroidism of renal origin: Secondary | ICD-10-CM | POA: Diagnosis not present

## 2021-11-18 DIAGNOSIS — Z992 Dependence on renal dialysis: Secondary | ICD-10-CM | POA: Diagnosis not present

## 2021-11-18 DIAGNOSIS — N186 End stage renal disease: Secondary | ICD-10-CM | POA: Diagnosis not present

## 2021-11-18 DIAGNOSIS — F419 Anxiety disorder, unspecified: Secondary | ICD-10-CM

## 2021-11-20 DIAGNOSIS — N186 End stage renal disease: Secondary | ICD-10-CM | POA: Diagnosis not present

## 2021-11-20 DIAGNOSIS — N2581 Secondary hyperparathyroidism of renal origin: Secondary | ICD-10-CM | POA: Diagnosis not present

## 2021-11-20 DIAGNOSIS — Z992 Dependence on renal dialysis: Secondary | ICD-10-CM | POA: Diagnosis not present

## 2021-11-22 DIAGNOSIS — N2581 Secondary hyperparathyroidism of renal origin: Secondary | ICD-10-CM | POA: Diagnosis not present

## 2021-11-22 DIAGNOSIS — Z992 Dependence on renal dialysis: Secondary | ICD-10-CM | POA: Diagnosis not present

## 2021-11-22 DIAGNOSIS — N186 End stage renal disease: Secondary | ICD-10-CM | POA: Diagnosis not present

## 2021-11-25 DIAGNOSIS — Z992 Dependence on renal dialysis: Secondary | ICD-10-CM | POA: Diagnosis not present

## 2021-11-25 DIAGNOSIS — N186 End stage renal disease: Secondary | ICD-10-CM | POA: Diagnosis not present

## 2021-11-25 DIAGNOSIS — N2581 Secondary hyperparathyroidism of renal origin: Secondary | ICD-10-CM | POA: Diagnosis not present

## 2021-11-27 DIAGNOSIS — N2581 Secondary hyperparathyroidism of renal origin: Secondary | ICD-10-CM | POA: Diagnosis not present

## 2021-11-27 DIAGNOSIS — N186 End stage renal disease: Secondary | ICD-10-CM | POA: Diagnosis not present

## 2021-11-27 DIAGNOSIS — Z992 Dependence on renal dialysis: Secondary | ICD-10-CM | POA: Diagnosis not present

## 2021-11-29 DIAGNOSIS — N2581 Secondary hyperparathyroidism of renal origin: Secondary | ICD-10-CM | POA: Diagnosis not present

## 2021-11-29 DIAGNOSIS — Z992 Dependence on renal dialysis: Secondary | ICD-10-CM | POA: Diagnosis not present

## 2021-11-29 DIAGNOSIS — N186 End stage renal disease: Secondary | ICD-10-CM | POA: Diagnosis not present

## 2021-12-02 DIAGNOSIS — Z992 Dependence on renal dialysis: Secondary | ICD-10-CM | POA: Diagnosis not present

## 2021-12-02 DIAGNOSIS — N186 End stage renal disease: Secondary | ICD-10-CM | POA: Diagnosis not present

## 2021-12-02 DIAGNOSIS — N2581 Secondary hyperparathyroidism of renal origin: Secondary | ICD-10-CM | POA: Diagnosis not present

## 2021-12-03 DIAGNOSIS — Z992 Dependence on renal dialysis: Secondary | ICD-10-CM | POA: Diagnosis not present

## 2021-12-03 DIAGNOSIS — I129 Hypertensive chronic kidney disease with stage 1 through stage 4 chronic kidney disease, or unspecified chronic kidney disease: Secondary | ICD-10-CM | POA: Diagnosis not present

## 2021-12-03 DIAGNOSIS — N186 End stage renal disease: Secondary | ICD-10-CM | POA: Diagnosis not present

## 2021-12-04 DIAGNOSIS — D631 Anemia in chronic kidney disease: Secondary | ICD-10-CM | POA: Diagnosis not present

## 2021-12-04 DIAGNOSIS — D509 Iron deficiency anemia, unspecified: Secondary | ICD-10-CM | POA: Diagnosis not present

## 2021-12-04 DIAGNOSIS — N186 End stage renal disease: Secondary | ICD-10-CM | POA: Diagnosis not present

## 2021-12-04 DIAGNOSIS — Z992 Dependence on renal dialysis: Secondary | ICD-10-CM | POA: Diagnosis not present

## 2021-12-04 DIAGNOSIS — N2581 Secondary hyperparathyroidism of renal origin: Secondary | ICD-10-CM | POA: Diagnosis not present

## 2021-12-06 DIAGNOSIS — Z992 Dependence on renal dialysis: Secondary | ICD-10-CM | POA: Diagnosis not present

## 2021-12-06 DIAGNOSIS — D509 Iron deficiency anemia, unspecified: Secondary | ICD-10-CM | POA: Diagnosis not present

## 2021-12-06 DIAGNOSIS — N2581 Secondary hyperparathyroidism of renal origin: Secondary | ICD-10-CM | POA: Diagnosis not present

## 2021-12-06 DIAGNOSIS — D631 Anemia in chronic kidney disease: Secondary | ICD-10-CM | POA: Diagnosis not present

## 2021-12-06 DIAGNOSIS — N186 End stage renal disease: Secondary | ICD-10-CM | POA: Diagnosis not present

## 2021-12-09 DIAGNOSIS — D631 Anemia in chronic kidney disease: Secondary | ICD-10-CM | POA: Diagnosis not present

## 2021-12-09 DIAGNOSIS — D509 Iron deficiency anemia, unspecified: Secondary | ICD-10-CM | POA: Diagnosis not present

## 2021-12-09 DIAGNOSIS — Z992 Dependence on renal dialysis: Secondary | ICD-10-CM | POA: Diagnosis not present

## 2021-12-09 DIAGNOSIS — N2581 Secondary hyperparathyroidism of renal origin: Secondary | ICD-10-CM | POA: Diagnosis not present

## 2021-12-09 DIAGNOSIS — N186 End stage renal disease: Secondary | ICD-10-CM | POA: Diagnosis not present

## 2021-12-11 DIAGNOSIS — D509 Iron deficiency anemia, unspecified: Secondary | ICD-10-CM | POA: Diagnosis not present

## 2021-12-11 DIAGNOSIS — D631 Anemia in chronic kidney disease: Secondary | ICD-10-CM | POA: Diagnosis not present

## 2021-12-11 DIAGNOSIS — N2581 Secondary hyperparathyroidism of renal origin: Secondary | ICD-10-CM | POA: Diagnosis not present

## 2021-12-11 DIAGNOSIS — Z992 Dependence on renal dialysis: Secondary | ICD-10-CM | POA: Diagnosis not present

## 2021-12-11 DIAGNOSIS — N186 End stage renal disease: Secondary | ICD-10-CM | POA: Diagnosis not present

## 2021-12-13 DIAGNOSIS — D631 Anemia in chronic kidney disease: Secondary | ICD-10-CM | POA: Diagnosis not present

## 2021-12-13 DIAGNOSIS — N2581 Secondary hyperparathyroidism of renal origin: Secondary | ICD-10-CM | POA: Diagnosis not present

## 2021-12-13 DIAGNOSIS — N186 End stage renal disease: Secondary | ICD-10-CM | POA: Diagnosis not present

## 2021-12-13 DIAGNOSIS — D509 Iron deficiency anemia, unspecified: Secondary | ICD-10-CM | POA: Diagnosis not present

## 2021-12-13 DIAGNOSIS — Z992 Dependence on renal dialysis: Secondary | ICD-10-CM | POA: Diagnosis not present

## 2021-12-16 DIAGNOSIS — Z992 Dependence on renal dialysis: Secondary | ICD-10-CM | POA: Diagnosis not present

## 2021-12-16 DIAGNOSIS — D631 Anemia in chronic kidney disease: Secondary | ICD-10-CM | POA: Diagnosis not present

## 2021-12-16 DIAGNOSIS — D509 Iron deficiency anemia, unspecified: Secondary | ICD-10-CM | POA: Diagnosis not present

## 2021-12-16 DIAGNOSIS — N186 End stage renal disease: Secondary | ICD-10-CM | POA: Diagnosis not present

## 2021-12-16 DIAGNOSIS — N2581 Secondary hyperparathyroidism of renal origin: Secondary | ICD-10-CM | POA: Diagnosis not present

## 2021-12-17 DIAGNOSIS — Z933 Colostomy status: Secondary | ICD-10-CM | POA: Diagnosis not present

## 2021-12-17 DIAGNOSIS — K5090 Crohn's disease, unspecified, without complications: Secondary | ICD-10-CM | POA: Diagnosis not present

## 2021-12-18 DIAGNOSIS — D509 Iron deficiency anemia, unspecified: Secondary | ICD-10-CM | POA: Diagnosis not present

## 2021-12-18 DIAGNOSIS — Z992 Dependence on renal dialysis: Secondary | ICD-10-CM | POA: Diagnosis not present

## 2021-12-18 DIAGNOSIS — N186 End stage renal disease: Secondary | ICD-10-CM | POA: Diagnosis not present

## 2021-12-18 DIAGNOSIS — N2581 Secondary hyperparathyroidism of renal origin: Secondary | ICD-10-CM | POA: Diagnosis not present

## 2021-12-18 DIAGNOSIS — D631 Anemia in chronic kidney disease: Secondary | ICD-10-CM | POA: Diagnosis not present

## 2021-12-20 DIAGNOSIS — N186 End stage renal disease: Secondary | ICD-10-CM | POA: Diagnosis not present

## 2021-12-20 DIAGNOSIS — D631 Anemia in chronic kidney disease: Secondary | ICD-10-CM | POA: Diagnosis not present

## 2021-12-20 DIAGNOSIS — D509 Iron deficiency anemia, unspecified: Secondary | ICD-10-CM | POA: Diagnosis not present

## 2021-12-20 DIAGNOSIS — Z992 Dependence on renal dialysis: Secondary | ICD-10-CM | POA: Diagnosis not present

## 2021-12-20 DIAGNOSIS — N2581 Secondary hyperparathyroidism of renal origin: Secondary | ICD-10-CM | POA: Diagnosis not present

## 2021-12-23 DIAGNOSIS — N2581 Secondary hyperparathyroidism of renal origin: Secondary | ICD-10-CM | POA: Diagnosis not present

## 2021-12-23 DIAGNOSIS — N186 End stage renal disease: Secondary | ICD-10-CM | POA: Diagnosis not present

## 2021-12-23 DIAGNOSIS — D631 Anemia in chronic kidney disease: Secondary | ICD-10-CM | POA: Diagnosis not present

## 2021-12-23 DIAGNOSIS — Z992 Dependence on renal dialysis: Secondary | ICD-10-CM | POA: Diagnosis not present

## 2021-12-23 DIAGNOSIS — D509 Iron deficiency anemia, unspecified: Secondary | ICD-10-CM | POA: Diagnosis not present

## 2021-12-25 DIAGNOSIS — Z992 Dependence on renal dialysis: Secondary | ICD-10-CM | POA: Diagnosis not present

## 2021-12-25 DIAGNOSIS — N2581 Secondary hyperparathyroidism of renal origin: Secondary | ICD-10-CM | POA: Diagnosis not present

## 2021-12-25 DIAGNOSIS — D631 Anemia in chronic kidney disease: Secondary | ICD-10-CM | POA: Diagnosis not present

## 2021-12-25 DIAGNOSIS — D509 Iron deficiency anemia, unspecified: Secondary | ICD-10-CM | POA: Diagnosis not present

## 2021-12-25 DIAGNOSIS — N186 End stage renal disease: Secondary | ICD-10-CM | POA: Diagnosis not present

## 2021-12-26 DIAGNOSIS — Z933 Colostomy status: Secondary | ICD-10-CM | POA: Diagnosis not present

## 2021-12-26 DIAGNOSIS — K5090 Crohn's disease, unspecified, without complications: Secondary | ICD-10-CM | POA: Diagnosis not present

## 2021-12-27 DIAGNOSIS — N2581 Secondary hyperparathyroidism of renal origin: Secondary | ICD-10-CM | POA: Diagnosis not present

## 2021-12-27 DIAGNOSIS — N186 End stage renal disease: Secondary | ICD-10-CM | POA: Diagnosis not present

## 2021-12-27 DIAGNOSIS — D509 Iron deficiency anemia, unspecified: Secondary | ICD-10-CM | POA: Diagnosis not present

## 2021-12-27 DIAGNOSIS — Z992 Dependence on renal dialysis: Secondary | ICD-10-CM | POA: Diagnosis not present

## 2021-12-27 DIAGNOSIS — D631 Anemia in chronic kidney disease: Secondary | ICD-10-CM | POA: Diagnosis not present

## 2021-12-30 DIAGNOSIS — N186 End stage renal disease: Secondary | ICD-10-CM | POA: Diagnosis not present

## 2021-12-30 DIAGNOSIS — N2581 Secondary hyperparathyroidism of renal origin: Secondary | ICD-10-CM | POA: Diagnosis not present

## 2021-12-30 DIAGNOSIS — Z992 Dependence on renal dialysis: Secondary | ICD-10-CM | POA: Diagnosis not present

## 2021-12-30 DIAGNOSIS — D509 Iron deficiency anemia, unspecified: Secondary | ICD-10-CM | POA: Diagnosis not present

## 2021-12-30 DIAGNOSIS — D631 Anemia in chronic kidney disease: Secondary | ICD-10-CM | POA: Diagnosis not present

## 2022-01-01 DIAGNOSIS — D631 Anemia in chronic kidney disease: Secondary | ICD-10-CM | POA: Diagnosis not present

## 2022-01-01 DIAGNOSIS — D509 Iron deficiency anemia, unspecified: Secondary | ICD-10-CM | POA: Diagnosis not present

## 2022-01-01 DIAGNOSIS — Z992 Dependence on renal dialysis: Secondary | ICD-10-CM | POA: Diagnosis not present

## 2022-01-01 DIAGNOSIS — N2581 Secondary hyperparathyroidism of renal origin: Secondary | ICD-10-CM | POA: Diagnosis not present

## 2022-01-01 DIAGNOSIS — N186 End stage renal disease: Secondary | ICD-10-CM | POA: Diagnosis not present

## 2022-01-02 DIAGNOSIS — N186 End stage renal disease: Secondary | ICD-10-CM | POA: Diagnosis not present

## 2022-01-02 DIAGNOSIS — I129 Hypertensive chronic kidney disease with stage 1 through stage 4 chronic kidney disease, or unspecified chronic kidney disease: Secondary | ICD-10-CM | POA: Diagnosis not present

## 2022-01-02 DIAGNOSIS — Z992 Dependence on renal dialysis: Secondary | ICD-10-CM | POA: Diagnosis not present

## 2022-01-03 DIAGNOSIS — D631 Anemia in chronic kidney disease: Secondary | ICD-10-CM | POA: Diagnosis not present

## 2022-01-03 DIAGNOSIS — R52 Pain, unspecified: Secondary | ICD-10-CM | POA: Diagnosis not present

## 2022-01-03 DIAGNOSIS — Z992 Dependence on renal dialysis: Secondary | ICD-10-CM | POA: Diagnosis not present

## 2022-01-03 DIAGNOSIS — N186 End stage renal disease: Secondary | ICD-10-CM | POA: Diagnosis not present

## 2022-01-03 DIAGNOSIS — N2581 Secondary hyperparathyroidism of renal origin: Secondary | ICD-10-CM | POA: Diagnosis not present

## 2022-01-03 DIAGNOSIS — D509 Iron deficiency anemia, unspecified: Secondary | ICD-10-CM | POA: Diagnosis not present

## 2022-01-06 DIAGNOSIS — R52 Pain, unspecified: Secondary | ICD-10-CM | POA: Diagnosis not present

## 2022-01-06 DIAGNOSIS — D509 Iron deficiency anemia, unspecified: Secondary | ICD-10-CM | POA: Diagnosis not present

## 2022-01-06 DIAGNOSIS — N186 End stage renal disease: Secondary | ICD-10-CM | POA: Diagnosis not present

## 2022-01-06 DIAGNOSIS — D631 Anemia in chronic kidney disease: Secondary | ICD-10-CM | POA: Diagnosis not present

## 2022-01-06 DIAGNOSIS — Z992 Dependence on renal dialysis: Secondary | ICD-10-CM | POA: Diagnosis not present

## 2022-01-06 DIAGNOSIS — N2581 Secondary hyperparathyroidism of renal origin: Secondary | ICD-10-CM | POA: Diagnosis not present

## 2022-01-08 DIAGNOSIS — N186 End stage renal disease: Secondary | ICD-10-CM | POA: Diagnosis not present

## 2022-01-08 DIAGNOSIS — R52 Pain, unspecified: Secondary | ICD-10-CM | POA: Diagnosis not present

## 2022-01-08 DIAGNOSIS — N2581 Secondary hyperparathyroidism of renal origin: Secondary | ICD-10-CM | POA: Diagnosis not present

## 2022-01-08 DIAGNOSIS — D509 Iron deficiency anemia, unspecified: Secondary | ICD-10-CM | POA: Diagnosis not present

## 2022-01-08 DIAGNOSIS — D631 Anemia in chronic kidney disease: Secondary | ICD-10-CM | POA: Diagnosis not present

## 2022-01-08 DIAGNOSIS — Z992 Dependence on renal dialysis: Secondary | ICD-10-CM | POA: Diagnosis not present

## 2022-01-10 DIAGNOSIS — D509 Iron deficiency anemia, unspecified: Secondary | ICD-10-CM | POA: Diagnosis not present

## 2022-01-10 DIAGNOSIS — N186 End stage renal disease: Secondary | ICD-10-CM | POA: Diagnosis not present

## 2022-01-10 DIAGNOSIS — R52 Pain, unspecified: Secondary | ICD-10-CM | POA: Diagnosis not present

## 2022-01-10 DIAGNOSIS — N2581 Secondary hyperparathyroidism of renal origin: Secondary | ICD-10-CM | POA: Diagnosis not present

## 2022-01-10 DIAGNOSIS — D631 Anemia in chronic kidney disease: Secondary | ICD-10-CM | POA: Diagnosis not present

## 2022-01-10 DIAGNOSIS — Z992 Dependence on renal dialysis: Secondary | ICD-10-CM | POA: Diagnosis not present

## 2022-01-13 DIAGNOSIS — R52 Pain, unspecified: Secondary | ICD-10-CM | POA: Diagnosis not present

## 2022-01-13 DIAGNOSIS — Z992 Dependence on renal dialysis: Secondary | ICD-10-CM | POA: Diagnosis not present

## 2022-01-13 DIAGNOSIS — D631 Anemia in chronic kidney disease: Secondary | ICD-10-CM | POA: Diagnosis not present

## 2022-01-13 DIAGNOSIS — N186 End stage renal disease: Secondary | ICD-10-CM | POA: Diagnosis not present

## 2022-01-13 DIAGNOSIS — N2581 Secondary hyperparathyroidism of renal origin: Secondary | ICD-10-CM | POA: Diagnosis not present

## 2022-01-13 DIAGNOSIS — D509 Iron deficiency anemia, unspecified: Secondary | ICD-10-CM | POA: Diagnosis not present

## 2022-01-15 DIAGNOSIS — D509 Iron deficiency anemia, unspecified: Secondary | ICD-10-CM | POA: Diagnosis not present

## 2022-01-15 DIAGNOSIS — D631 Anemia in chronic kidney disease: Secondary | ICD-10-CM | POA: Diagnosis not present

## 2022-01-15 DIAGNOSIS — R52 Pain, unspecified: Secondary | ICD-10-CM | POA: Diagnosis not present

## 2022-01-15 DIAGNOSIS — N186 End stage renal disease: Secondary | ICD-10-CM | POA: Diagnosis not present

## 2022-01-15 DIAGNOSIS — Z992 Dependence on renal dialysis: Secondary | ICD-10-CM | POA: Diagnosis not present

## 2022-01-15 DIAGNOSIS — N2581 Secondary hyperparathyroidism of renal origin: Secondary | ICD-10-CM | POA: Diagnosis not present

## 2022-01-17 ENCOUNTER — Other Ambulatory Visit: Payer: Self-pay | Admitting: Family Medicine

## 2022-01-17 DIAGNOSIS — D509 Iron deficiency anemia, unspecified: Secondary | ICD-10-CM | POA: Diagnosis not present

## 2022-01-17 DIAGNOSIS — D631 Anemia in chronic kidney disease: Secondary | ICD-10-CM | POA: Diagnosis not present

## 2022-01-17 DIAGNOSIS — R52 Pain, unspecified: Secondary | ICD-10-CM | POA: Diagnosis not present

## 2022-01-17 DIAGNOSIS — N186 End stage renal disease: Secondary | ICD-10-CM | POA: Diagnosis not present

## 2022-01-17 DIAGNOSIS — N2581 Secondary hyperparathyroidism of renal origin: Secondary | ICD-10-CM | POA: Diagnosis not present

## 2022-01-17 DIAGNOSIS — K50013 Crohn's disease of small intestine with fistula: Secondary | ICD-10-CM

## 2022-01-17 DIAGNOSIS — Z992 Dependence on renal dialysis: Secondary | ICD-10-CM | POA: Diagnosis not present

## 2022-01-20 DIAGNOSIS — D509 Iron deficiency anemia, unspecified: Secondary | ICD-10-CM | POA: Diagnosis not present

## 2022-01-20 DIAGNOSIS — N186 End stage renal disease: Secondary | ICD-10-CM | POA: Diagnosis not present

## 2022-01-20 DIAGNOSIS — R52 Pain, unspecified: Secondary | ICD-10-CM | POA: Diagnosis not present

## 2022-01-20 DIAGNOSIS — Z992 Dependence on renal dialysis: Secondary | ICD-10-CM | POA: Diagnosis not present

## 2022-01-20 DIAGNOSIS — N2581 Secondary hyperparathyroidism of renal origin: Secondary | ICD-10-CM | POA: Diagnosis not present

## 2022-01-20 DIAGNOSIS — D631 Anemia in chronic kidney disease: Secondary | ICD-10-CM | POA: Diagnosis not present

## 2022-01-22 DIAGNOSIS — D631 Anemia in chronic kidney disease: Secondary | ICD-10-CM | POA: Diagnosis not present

## 2022-01-22 DIAGNOSIS — D509 Iron deficiency anemia, unspecified: Secondary | ICD-10-CM | POA: Diagnosis not present

## 2022-01-22 DIAGNOSIS — N2581 Secondary hyperparathyroidism of renal origin: Secondary | ICD-10-CM | POA: Diagnosis not present

## 2022-01-22 DIAGNOSIS — N186 End stage renal disease: Secondary | ICD-10-CM | POA: Diagnosis not present

## 2022-01-22 DIAGNOSIS — Z992 Dependence on renal dialysis: Secondary | ICD-10-CM | POA: Diagnosis not present

## 2022-01-22 DIAGNOSIS — R52 Pain, unspecified: Secondary | ICD-10-CM | POA: Diagnosis not present

## 2022-01-23 DIAGNOSIS — N186 End stage renal disease: Secondary | ICD-10-CM | POA: Diagnosis not present

## 2022-01-23 DIAGNOSIS — I871 Compression of vein: Secondary | ICD-10-CM | POA: Diagnosis not present

## 2022-01-23 DIAGNOSIS — T82858A Stenosis of vascular prosthetic devices, implants and grafts, initial encounter: Secondary | ICD-10-CM | POA: Diagnosis not present

## 2022-01-23 DIAGNOSIS — Z992 Dependence on renal dialysis: Secondary | ICD-10-CM | POA: Diagnosis not present

## 2022-01-24 DIAGNOSIS — D509 Iron deficiency anemia, unspecified: Secondary | ICD-10-CM | POA: Diagnosis not present

## 2022-01-24 DIAGNOSIS — Z992 Dependence on renal dialysis: Secondary | ICD-10-CM | POA: Diagnosis not present

## 2022-01-24 DIAGNOSIS — R52 Pain, unspecified: Secondary | ICD-10-CM | POA: Diagnosis not present

## 2022-01-24 DIAGNOSIS — N2581 Secondary hyperparathyroidism of renal origin: Secondary | ICD-10-CM | POA: Diagnosis not present

## 2022-01-24 DIAGNOSIS — D631 Anemia in chronic kidney disease: Secondary | ICD-10-CM | POA: Diagnosis not present

## 2022-01-24 DIAGNOSIS — N186 End stage renal disease: Secondary | ICD-10-CM | POA: Diagnosis not present

## 2022-01-26 ENCOUNTER — Other Ambulatory Visit: Payer: Self-pay | Admitting: Family Medicine

## 2022-01-27 DIAGNOSIS — N2581 Secondary hyperparathyroidism of renal origin: Secondary | ICD-10-CM | POA: Diagnosis not present

## 2022-01-27 DIAGNOSIS — D631 Anemia in chronic kidney disease: Secondary | ICD-10-CM | POA: Diagnosis not present

## 2022-01-27 DIAGNOSIS — R52 Pain, unspecified: Secondary | ICD-10-CM | POA: Diagnosis not present

## 2022-01-27 DIAGNOSIS — N186 End stage renal disease: Secondary | ICD-10-CM | POA: Diagnosis not present

## 2022-01-27 DIAGNOSIS — Z992 Dependence on renal dialysis: Secondary | ICD-10-CM | POA: Diagnosis not present

## 2022-01-27 DIAGNOSIS — D509 Iron deficiency anemia, unspecified: Secondary | ICD-10-CM | POA: Diagnosis not present

## 2022-01-29 DIAGNOSIS — D631 Anemia in chronic kidney disease: Secondary | ICD-10-CM | POA: Diagnosis not present

## 2022-01-29 DIAGNOSIS — N186 End stage renal disease: Secondary | ICD-10-CM | POA: Diagnosis not present

## 2022-01-29 DIAGNOSIS — N2581 Secondary hyperparathyroidism of renal origin: Secondary | ICD-10-CM | POA: Diagnosis not present

## 2022-01-29 DIAGNOSIS — D509 Iron deficiency anemia, unspecified: Secondary | ICD-10-CM | POA: Diagnosis not present

## 2022-01-29 DIAGNOSIS — Z992 Dependence on renal dialysis: Secondary | ICD-10-CM | POA: Diagnosis not present

## 2022-01-29 DIAGNOSIS — R52 Pain, unspecified: Secondary | ICD-10-CM | POA: Diagnosis not present

## 2022-01-31 DIAGNOSIS — D631 Anemia in chronic kidney disease: Secondary | ICD-10-CM | POA: Diagnosis not present

## 2022-01-31 DIAGNOSIS — D509 Iron deficiency anemia, unspecified: Secondary | ICD-10-CM | POA: Diagnosis not present

## 2022-01-31 DIAGNOSIS — N2581 Secondary hyperparathyroidism of renal origin: Secondary | ICD-10-CM | POA: Diagnosis not present

## 2022-01-31 DIAGNOSIS — R52 Pain, unspecified: Secondary | ICD-10-CM | POA: Diagnosis not present

## 2022-01-31 DIAGNOSIS — N186 End stage renal disease: Secondary | ICD-10-CM | POA: Diagnosis not present

## 2022-01-31 DIAGNOSIS — Z992 Dependence on renal dialysis: Secondary | ICD-10-CM | POA: Diagnosis not present

## 2022-02-02 DIAGNOSIS — Z992 Dependence on renal dialysis: Secondary | ICD-10-CM | POA: Diagnosis not present

## 2022-02-02 DIAGNOSIS — N186 End stage renal disease: Secondary | ICD-10-CM | POA: Diagnosis not present

## 2022-02-02 DIAGNOSIS — I129 Hypertensive chronic kidney disease with stage 1 through stage 4 chronic kidney disease, or unspecified chronic kidney disease: Secondary | ICD-10-CM | POA: Diagnosis not present

## 2022-02-03 DIAGNOSIS — D631 Anemia in chronic kidney disease: Secondary | ICD-10-CM | POA: Diagnosis not present

## 2022-02-03 DIAGNOSIS — R52 Pain, unspecified: Secondary | ICD-10-CM | POA: Diagnosis not present

## 2022-02-03 DIAGNOSIS — N186 End stage renal disease: Secondary | ICD-10-CM | POA: Diagnosis not present

## 2022-02-03 DIAGNOSIS — N2581 Secondary hyperparathyroidism of renal origin: Secondary | ICD-10-CM | POA: Diagnosis not present

## 2022-02-03 DIAGNOSIS — Z992 Dependence on renal dialysis: Secondary | ICD-10-CM | POA: Diagnosis not present

## 2022-02-04 DIAGNOSIS — Z933 Colostomy status: Secondary | ICD-10-CM | POA: Diagnosis not present

## 2022-02-04 DIAGNOSIS — K5090 Crohn's disease, unspecified, without complications: Secondary | ICD-10-CM | POA: Diagnosis not present

## 2022-02-05 DIAGNOSIS — N2581 Secondary hyperparathyroidism of renal origin: Secondary | ICD-10-CM | POA: Diagnosis not present

## 2022-02-05 DIAGNOSIS — Z992 Dependence on renal dialysis: Secondary | ICD-10-CM | POA: Diagnosis not present

## 2022-02-05 DIAGNOSIS — N186 End stage renal disease: Secondary | ICD-10-CM | POA: Diagnosis not present

## 2022-02-05 DIAGNOSIS — R52 Pain, unspecified: Secondary | ICD-10-CM | POA: Diagnosis not present

## 2022-02-05 DIAGNOSIS — D631 Anemia in chronic kidney disease: Secondary | ICD-10-CM | POA: Diagnosis not present

## 2022-02-07 DIAGNOSIS — R52 Pain, unspecified: Secondary | ICD-10-CM | POA: Diagnosis not present

## 2022-02-07 DIAGNOSIS — Z992 Dependence on renal dialysis: Secondary | ICD-10-CM | POA: Diagnosis not present

## 2022-02-07 DIAGNOSIS — N2581 Secondary hyperparathyroidism of renal origin: Secondary | ICD-10-CM | POA: Diagnosis not present

## 2022-02-07 DIAGNOSIS — D631 Anemia in chronic kidney disease: Secondary | ICD-10-CM | POA: Diagnosis not present

## 2022-02-07 DIAGNOSIS — N186 End stage renal disease: Secondary | ICD-10-CM | POA: Diagnosis not present

## 2022-02-10 DIAGNOSIS — R52 Pain, unspecified: Secondary | ICD-10-CM | POA: Diagnosis not present

## 2022-02-10 DIAGNOSIS — D631 Anemia in chronic kidney disease: Secondary | ICD-10-CM | POA: Diagnosis not present

## 2022-02-10 DIAGNOSIS — N186 End stage renal disease: Secondary | ICD-10-CM | POA: Diagnosis not present

## 2022-02-10 DIAGNOSIS — N2581 Secondary hyperparathyroidism of renal origin: Secondary | ICD-10-CM | POA: Diagnosis not present

## 2022-02-10 DIAGNOSIS — Z992 Dependence on renal dialysis: Secondary | ICD-10-CM | POA: Diagnosis not present

## 2022-02-12 DIAGNOSIS — N2581 Secondary hyperparathyroidism of renal origin: Secondary | ICD-10-CM | POA: Diagnosis not present

## 2022-02-12 DIAGNOSIS — N186 End stage renal disease: Secondary | ICD-10-CM | POA: Diagnosis not present

## 2022-02-12 DIAGNOSIS — Z992 Dependence on renal dialysis: Secondary | ICD-10-CM | POA: Diagnosis not present

## 2022-02-12 DIAGNOSIS — D631 Anemia in chronic kidney disease: Secondary | ICD-10-CM | POA: Diagnosis not present

## 2022-02-12 DIAGNOSIS — R52 Pain, unspecified: Secondary | ICD-10-CM | POA: Diagnosis not present

## 2022-02-14 DIAGNOSIS — D631 Anemia in chronic kidney disease: Secondary | ICD-10-CM | POA: Diagnosis not present

## 2022-02-14 DIAGNOSIS — R52 Pain, unspecified: Secondary | ICD-10-CM | POA: Diagnosis not present

## 2022-02-14 DIAGNOSIS — N186 End stage renal disease: Secondary | ICD-10-CM | POA: Diagnosis not present

## 2022-02-14 DIAGNOSIS — Z992 Dependence on renal dialysis: Secondary | ICD-10-CM | POA: Diagnosis not present

## 2022-02-14 DIAGNOSIS — N2581 Secondary hyperparathyroidism of renal origin: Secondary | ICD-10-CM | POA: Diagnosis not present

## 2022-02-17 DIAGNOSIS — N186 End stage renal disease: Secondary | ICD-10-CM | POA: Diagnosis not present

## 2022-02-17 DIAGNOSIS — D631 Anemia in chronic kidney disease: Secondary | ICD-10-CM | POA: Diagnosis not present

## 2022-02-17 DIAGNOSIS — R52 Pain, unspecified: Secondary | ICD-10-CM | POA: Diagnosis not present

## 2022-02-17 DIAGNOSIS — N2581 Secondary hyperparathyroidism of renal origin: Secondary | ICD-10-CM | POA: Diagnosis not present

## 2022-02-17 DIAGNOSIS — Z992 Dependence on renal dialysis: Secondary | ICD-10-CM | POA: Diagnosis not present

## 2022-02-19 DIAGNOSIS — Z992 Dependence on renal dialysis: Secondary | ICD-10-CM | POA: Diagnosis not present

## 2022-02-19 DIAGNOSIS — D631 Anemia in chronic kidney disease: Secondary | ICD-10-CM | POA: Diagnosis not present

## 2022-02-19 DIAGNOSIS — N186 End stage renal disease: Secondary | ICD-10-CM | POA: Diagnosis not present

## 2022-02-19 DIAGNOSIS — R52 Pain, unspecified: Secondary | ICD-10-CM | POA: Diagnosis not present

## 2022-02-19 DIAGNOSIS — N2581 Secondary hyperparathyroidism of renal origin: Secondary | ICD-10-CM | POA: Diagnosis not present

## 2022-02-21 DIAGNOSIS — N186 End stage renal disease: Secondary | ICD-10-CM | POA: Diagnosis not present

## 2022-02-21 DIAGNOSIS — N2581 Secondary hyperparathyroidism of renal origin: Secondary | ICD-10-CM | POA: Diagnosis not present

## 2022-02-21 DIAGNOSIS — D631 Anemia in chronic kidney disease: Secondary | ICD-10-CM | POA: Diagnosis not present

## 2022-02-21 DIAGNOSIS — Z992 Dependence on renal dialysis: Secondary | ICD-10-CM | POA: Diagnosis not present

## 2022-02-21 DIAGNOSIS — R52 Pain, unspecified: Secondary | ICD-10-CM | POA: Diagnosis not present

## 2022-02-24 DIAGNOSIS — N2581 Secondary hyperparathyroidism of renal origin: Secondary | ICD-10-CM | POA: Diagnosis not present

## 2022-02-24 DIAGNOSIS — D631 Anemia in chronic kidney disease: Secondary | ICD-10-CM | POA: Diagnosis not present

## 2022-02-24 DIAGNOSIS — N186 End stage renal disease: Secondary | ICD-10-CM | POA: Diagnosis not present

## 2022-02-24 DIAGNOSIS — R52 Pain, unspecified: Secondary | ICD-10-CM | POA: Diagnosis not present

## 2022-02-24 DIAGNOSIS — Z992 Dependence on renal dialysis: Secondary | ICD-10-CM | POA: Diagnosis not present

## 2022-02-26 DIAGNOSIS — N186 End stage renal disease: Secondary | ICD-10-CM | POA: Diagnosis not present

## 2022-02-26 DIAGNOSIS — R52 Pain, unspecified: Secondary | ICD-10-CM | POA: Diagnosis not present

## 2022-02-26 DIAGNOSIS — Z992 Dependence on renal dialysis: Secondary | ICD-10-CM | POA: Diagnosis not present

## 2022-02-26 DIAGNOSIS — N2581 Secondary hyperparathyroidism of renal origin: Secondary | ICD-10-CM | POA: Diagnosis not present

## 2022-02-26 DIAGNOSIS — D631 Anemia in chronic kidney disease: Secondary | ICD-10-CM | POA: Diagnosis not present

## 2022-02-28 ENCOUNTER — Encounter (HOSPITAL_COMMUNITY): Payer: Self-pay | Admitting: *Deleted

## 2022-02-28 ENCOUNTER — Emergency Department (HOSPITAL_COMMUNITY): Payer: Medicare Other

## 2022-02-28 ENCOUNTER — Emergency Department (HOSPITAL_COMMUNITY)
Admission: EM | Admit: 2022-02-28 | Discharge: 2022-02-28 | Disposition: A | Discharge status: Home / Self Care | Payer: Medicare Other | Attending: Emergency Medicine | Admitting: Emergency Medicine

## 2022-02-28 ENCOUNTER — Other Ambulatory Visit: Payer: Self-pay

## 2022-02-28 DIAGNOSIS — Z743 Need for continuous supervision: Secondary | ICD-10-CM | POA: Diagnosis not present

## 2022-02-28 DIAGNOSIS — Z992 Dependence on renal dialysis: Secondary | ICD-10-CM | POA: Diagnosis not present

## 2022-02-28 DIAGNOSIS — R0789 Other chest pain: Secondary | ICD-10-CM

## 2022-02-28 DIAGNOSIS — R051 Acute cough: Secondary | ICD-10-CM | POA: Diagnosis not present

## 2022-02-28 DIAGNOSIS — R059 Cough, unspecified: Secondary | ICD-10-CM | POA: Diagnosis not present

## 2022-02-28 DIAGNOSIS — J439 Emphysema, unspecified: Secondary | ICD-10-CM | POA: Diagnosis not present

## 2022-02-28 DIAGNOSIS — Z20822 Contact with and (suspected) exposure to covid-19: Secondary | ICD-10-CM | POA: Diagnosis not present

## 2022-02-28 DIAGNOSIS — N186 End stage renal disease: Secondary | ICD-10-CM | POA: Diagnosis not present

## 2022-02-28 DIAGNOSIS — G4489 Other headache syndrome: Secondary | ICD-10-CM | POA: Diagnosis not present

## 2022-02-28 DIAGNOSIS — I509 Heart failure, unspecified: Secondary | ICD-10-CM | POA: Diagnosis not present

## 2022-02-28 DIAGNOSIS — R279 Unspecified lack of coordination: Secondary | ICD-10-CM | POA: Diagnosis not present

## 2022-02-28 DIAGNOSIS — R079 Chest pain, unspecified: Secondary | ICD-10-CM | POA: Diagnosis not present

## 2022-02-28 DIAGNOSIS — I2510 Atherosclerotic heart disease of native coronary artery without angina pectoris: Secondary | ICD-10-CM | POA: Diagnosis not present

## 2022-02-28 DIAGNOSIS — R778 Other specified abnormalities of plasma proteins: Secondary | ICD-10-CM | POA: Diagnosis not present

## 2022-02-28 DIAGNOSIS — R6889 Other general symptoms and signs: Secondary | ICD-10-CM | POA: Diagnosis not present

## 2022-02-28 LAB — BASIC METABOLIC PANEL
Anion gap: 14 (ref 5–15)
BUN: 28 mg/dL — ABNORMAL HIGH (ref 8–23)
CO2: 21 mmol/L — ABNORMAL LOW (ref 22–32)
Calcium: 8.5 mg/dL — ABNORMAL LOW (ref 8.9–10.3)
Chloride: 97 mmol/L — ABNORMAL LOW (ref 98–111)
Creatinine, Ser: 8.88 mg/dL — ABNORMAL HIGH (ref 0.44–1.00)
GFR, Estimated: 4 mL/min — ABNORMAL LOW (ref >60)
Glucose, Bld: 94 mg/dL (ref 70–99)
Potassium: 3.4 mmol/L — ABNORMAL LOW (ref 3.5–5.1)
Sodium: 132 mmol/L — ABNORMAL LOW (ref 135–145)

## 2022-02-28 LAB — CBC
HCT: 32.2 % — ABNORMAL LOW (ref 36.0–46.0)
Hemoglobin: 10.4 g/dL — ABNORMAL LOW (ref 12.0–15.0)
MCH: 34.3 pg — ABNORMAL HIGH (ref 26.0–34.0)
MCHC: 32.3 g/dL (ref 30.0–36.0)
MCV: 106.3 fL — ABNORMAL HIGH (ref 80.0–100.0)
Platelets: 166 10*3/uL (ref 150–400)
RBC: 3.03 MIL/uL — ABNORMAL LOW (ref 3.87–5.11)
RDW: 15.4 % (ref 11.5–15.5)
WBC: 6 10*3/uL (ref 4.0–10.5)
nRBC: 0.5 % — ABNORMAL HIGH (ref 0.0–0.2)

## 2022-02-28 LAB — SARS CORONAVIRUS 2 BY RT PCR: SARS Coronavirus 2 by RT PCR: NEGATIVE

## 2022-02-28 LAB — TROPONIN I (HIGH SENSITIVITY)
Troponin I (High Sensitivity): 23 ng/L — ABNORMAL HIGH (ref <18)
Troponin I (High Sensitivity): 27 ng/L — ABNORMAL HIGH (ref <18)

## 2022-02-28 NOTE — ED Triage Notes (Signed)
Pt states when she got to dialysis she coughed, then had a "stinging" sensation in the center of her chest followed by a "tightness" and "heaviness". Pain was about a 7/10, now it is a 3/10. Associated with SOB.

## 2022-02-28 NOTE — Progress Notes (Signed)
   02/28/22 1539  TOC ED Mini Assessment  PING Used in TOC Assessment No  Admission or Readmission Diverted Yes  Interventions which prevented an admission or readmission Follow-up medical appointment  What brought you to the Emergency Department?  SOB at Dialysis  Barriers to Discharge ED No Barriers  Means of departure Public Transportation   CSW contacted by MD regarding patient missing dialysis and being anxious. CSW contacted patient's dialysis center and noted they could not see her today but will put her on a waitlist to be contacted if anything opens up before her next appointment. CSW informed MD.

## 2022-02-28 NOTE — ED Notes (Signed)
PTAR in dept to transport pt home.

## 2022-02-28 NOTE — ED Provider Notes (Signed)
[facility] EMERGENCY DEPARTMENT Provider Note   CSN: [id number] Arrival date & time: 02/28/22  4982     History  Chief Complaint  Patient presents with   Chest Pain    [name] [name] is a 71 y.o. female.  The history is provided by the patient. No language interpreter was used.  Chest Pain Pain location:  Substernal area Pain quality: pressure   Pain quality comment:  Tingling Pain radiates to:  Does not radiate Pain severity:  Mild Onset quality:  Gradual Timing:  Constant Progression:  Resolved Chronicity:  Recurrent Relieved by:  Nothing Worsened by:  Coughing Ineffective treatments:  None tried Associated symptoms: anxiety and cough   Associated symptoms: no abdominal pain, no altered mental status, no back pain, no claudication, no diaphoresis, no dizziness, no fatigue, no fever, no headache, no heartburn, no lower extremity edema, no nausea, no near-syncope, no palpitations, no shortness of breath, no vomiting and no weakness        Home Medications Prior to Admission medications   Medication Sig Start Date End Date Taking? Authorizing Provider  atorvastatin (LIPITOR) 40 MG tablet Take 1 tablet (40 mg total) by mouth daily. 06/16/21   Zenia Resides, MD  citalopram (CELEXA) 10 MG tablet Take 1 tablet (10 mg total) by mouth daily. 09/22/21   Zenia Resides, MD  clopidogrel (PLAVIX) 75 MG tablet Take 1 tablet (75 mg total) by mouth daily. 08/04/21   Zenia Resides, MD  LORazepam (ATIVAN) 0.5 MG tablet TAKE ONE TABLET BY MOUTH TWICE DAILY AS NEEDED FOR ANXIETY 11/19/21   Lenoria Chime, MD  Mesalamine 800 MG TBEC TAKE ONE TABLET BY MOUTH THREE TIMES DAILY 01/19/22   Zenia Resides, MD  mirtazapine (REMERON) 15 MG tablet Take 1 tablet (15 mg total) by mouth at bedtime. 09/22/21   Zenia Resides, MD  pregabalin (LYRICA) 25 MG capsule Take 1 capsule (25 mg total) by mouth 2 (two) times daily. 01/26/22   Zenia Resides, MD      Allergies     Amoxicillin, Aspirin, Morphine and related, Penicillins, Gabapentin, Ciprofloxacin, and Ace inhibitors    Review of Systems   Review of Systems  Constitutional:  Positive for chills. Negative for diaphoresis, fatigue and fever.  HENT:  Negative for congestion.   Eyes:  Negative for visual disturbance.  Respiratory:  Positive for cough and chest tightness. Negative for shortness of breath and wheezing.   Cardiovascular:  Positive for chest pain. Negative for palpitations, claudication, leg swelling and near-syncope.  Gastrointestinal:  Negative for abdominal pain, constipation, diarrhea, heartburn, nausea and vomiting.  Genitourinary:  Negative for dysuria and flank pain.  Musculoskeletal:  Negative for back pain, neck pain and neck stiffness.  Skin:  Negative for rash and wound.  Neurological:  Negative for dizziness, seizures, weakness, light-headedness and headaches.  Psychiatric/Behavioral:  Negative for agitation and confusion.   All other systems reviewed and are negative.   Physical Exam Updated Vital Signs BP (!) 93/57 (BP Location: Left Arm)   Pulse 77   Temp 98.7 F (37.1 C) (Oral)   Resp 14   SpO2 97%  Physical Exam Vitals and nursing note reviewed.  Constitutional:      General: She is not in acute distress.    Appearance: She is well-developed. She is not ill-appearing, toxic-appearing or diaphoretic.  HENT:     Head: Normocephalic and atraumatic.  Eyes:     Extraocular Movements: Extraocular movements intact.  Conjunctiva/sclera: Conjunctivae normal.     Pupils: Pupils are equal, round, and reactive to light.  Cardiovascular:     Rate and Rhythm: Normal rate and regular rhythm.     Heart sounds: Normal heart sounds. No murmur heard. Pulmonary:     Effort: Pulmonary effort is normal. No respiratory distress.     Breath sounds: Normal breath sounds. No wheezing, rhonchi or rales.  Chest:     Chest wall: No tenderness.  Abdominal:     Palpations: Abdomen  is soft.     Tenderness: There is no abdominal tenderness.  Musculoskeletal:        General: No swelling.     Cervical back: Neck supple.     Right lower leg: No tenderness. No edema.     Left lower leg: No tenderness. No edema.  Skin:    General: Skin is warm and dry.     Capillary Refill: Capillary refill takes less than 2 seconds.  Neurological:     General: No focal deficit present.     Mental Status: She is alert.  Psychiatric:        Mood and Affect: Mood normal.     ED Results / Procedures / Treatments   Labs (all labs ordered are listed, but only abnormal results are displayed) Labs Reviewed  BASIC METABOLIC PANEL - Abnormal; Notable for the following components:      Result Value   Sodium 132 (*)    Potassium 3.4 (*)    Chloride 97 (*)    CO2 21 (*)    BUN 28 (*)    Creatinine, Ser 8.88 (*)    Calcium 8.5 (*)    GFR, Estimated 4 (*)    All other components within normal limits  CBC - Abnormal; Notable for the following components:   RBC 3.03 (*)    Hemoglobin 10.4 (*)    HCT 32.2 (*)    MCV 106.3 (*)    MCH 34.3 (*)    nRBC 0.5 (*)    All other components within normal limits  TROPONIN I (HIGH SENSITIVITY) - Abnormal; Notable for the following components:   Troponin I (High Sensitivity) 23 (*)    All other components within normal limits  TROPONIN I (HIGH SENSITIVITY) - Abnormal; Notable for the following components:   Troponin I (High Sensitivity) 27 (*)    All other components within normal limits  SARS CORONAVIRUS 2 BY RT PCR    EKG EKG Interpretation  Date/Time:  Saturday February 28 2022 07:01:35 EDT Ventricular Rate:  72 PR Interval:  180 QRS Duration: 108 QT Interval:  410 QTC Calculation: 448 R Axis:   35 Text Interpretation: Normal sinus rhythm Nonspecific ST abnormality Abnormal ECG When compared with ECG of 19-Jun-2021 12:39, PREVIOUS ECG IS PRESENT when compared to prior, similar appearance. No STEMI Confirmed by Antony Blackbird [phone]) on  02/28/2022 11:07:48 AM  Radiology DG Chest 2 View  Result Date: 02/28/2022 CLINICAL DATA:  Chest pain EXAM: CHEST - 2 VIEW COMPARISON:  04/24/2021 FINDINGS: Large lung volumes with interstitial coarsening and linear scarring. Biapical lucency from emphysema. There is no edema, consolidation, effusion, or pneumothorax. Normal heart size and stable mediastinal contours. IMPRESSION: Chronic lung disease without acute superimposed finding. Electronically Signed   By: Jorje Guild [name]   On: 02/28/2022 07:30    Procedures Procedures    Medications Ordered in ED Medications - No data to display  ED Course/ Medical Decision Making/ A&P  Medical Decision Making Amount and/or Complexity of Data Reviewed Labs: ordered. Radiology: ordered.    Saima Monterroso is a 71 y.o. female with a past medical history significant for ESRD on dialysis TTS, heart failure, CAD, previous reflux esophagitis, eczema, Crohn's disease, and previous seizures who presents from her dialysis center for some cough and chest discomfort.  Patient reports that she had some coughing this morning and some chills when she arrived to her dialysis center.  She reports that she started having some chest tightness and had a small productive cough so they sent her to the emergency department for evaluation.  She reports the chest tightness has resolved and she does not feel any shortness of breath.  She still reports intermittent coughing.  She reports the pain does not go to her back and has no abdomen pain.  She denies nausea, vomiting, constipation, or diarrhea in her ostomy.  She denies any trauma.  She reports the discomfort is mild that is now resolved.  She denies any headache or neck pain.  Denies any leg swelling to indicate fluid overload.  She reported some tingling in her chest and thinks that was her anxiety kicking and when she was sent away from dialysis.  She is very nervous about missing dialysis.   She has not missed any recently.  Otherwise she denies other complaints aside from the chills.  On exam, lungs were clear and chest was nontender.  Abdomen was nontender.  No murmur.  Good pulses in extremities.  No focal neurologic deficits.  No edema in the legs.  EKG does not show STEMI.  Clinically I do suspect that patient may have had some musculoskeletal chest discomfort in the setting of a coughing fit while waiting for dialysis.  Given the COVID in the community, we will check for COVID-19 given her chills and cough.  We will get her back on the monitor to watch for hypotension as she has had this in the past and said that her blood pressure was low at the dialysis center.  We had some work-up starting in triage that had an x-ray that shows chronic changes but no acute superimposed abnormality such as pneumonia.  Her white count was normal and her hemoglobin is improved from prior.  Her electrolytes showed no hyperkalemia and her kidney function is elevated as expected not having dialysis yet today.  We will get the second troponin as a first 1 was slightly elevated and trended.  If symptoms remain improved and vital signs improved, anticipate discussion with transition of care team to determine if she is able to get dialysis today or will need to reschedule it.  Patient is in no distress at this time.  Troponin returned similar in the 20s.  She is denying any further chest pain or other symptoms.  I had our transition of care team contact her dialysis center to see what treatments are available before her next dialysis on Tuesday.  They are going to call her tomorrow or Monday to try to get her dialysis sooner.  Patient agrees with this plan.  Patient agrees with return precautions and follow-up instructions and remained symptom-free now.   Patient discharged in good condition with suspected muscular chest wall pain after coughing fit at dialysis today but otherwise stable appearing  work-up.        Final Clinical Impression(s) / ED Diagnoses Final diagnoses:  Atypical chest pain  Acute cough    Rx / DC Orders ED Discharge Orders  None       Clinical Impression: 1. Atypical chest pain   2. Acute cough     Disposition: Discharge  Condition: Good  I have discussed the results, Dx and Tx plan with the pt(& family if present). He/she/they expressed understanding and agree(s) with the plan. Discharge instructions discussed at great length. Strict return precautions discussed and pt &/or family have verbalized understanding of the instructions. No further questions at time of discharge.    New Prescriptions   No medications on file    Follow Up: Zenia Resides, MD [place] [address] [place] [address] [id number] [place]       [place] Spirito, Gwenyth Allegra, MD 02/28/22 1600

## 2022-02-28 NOTE — ED Notes (Signed)
[facility] requested for transport

## 2022-02-28 NOTE — Discharge Instructions (Addendum)
Your work-up today appeared similar to prior.  Your troponins were not rising precipitously and you remained symptom-free while here.  The x-ray did not show pneumonia and your COVID test was negative.  Your kidney function was elevated as expected as you missed dialysis today however given your normal potassium and otherwise well appearance we do feel you are safe for discharge home to try to get dialysis if possible before Tuesday.  If you develop any new symptoms whatsoever or things worsen, please return to the nearest emergency department as you may need reevaluation.

## 2022-02-28 NOTE — ED Triage Notes (Signed)
Pt arrives via [facility] from dialysis center. Per report pt was at dialysis and had onset of cp, Pt had not even started dialysis yet. En route, vitals 118/65, hr 87, 98% ra, cbg 101.

## 2022-03-03 ENCOUNTER — Telehealth: Payer: Self-pay

## 2022-03-03 DIAGNOSIS — N186 End stage renal disease: Secondary | ICD-10-CM | POA: Diagnosis not present

## 2022-03-03 DIAGNOSIS — R52 Pain, unspecified: Secondary | ICD-10-CM | POA: Diagnosis not present

## 2022-03-03 DIAGNOSIS — D631 Anemia in chronic kidney disease: Secondary | ICD-10-CM | POA: Diagnosis not present

## 2022-03-03 DIAGNOSIS — N2581 Secondary hyperparathyroidism of renal origin: Secondary | ICD-10-CM | POA: Diagnosis not present

## 2022-03-03 DIAGNOSIS — Z992 Dependence on renal dialysis: Secondary | ICD-10-CM | POA: Diagnosis not present

## 2022-03-03 NOTE — Patient Outreach (Signed)
  Care Coordination [facility] Note Transition Care Management Unsuccessful Follow-up Telephone Call  Date of discharge and from where:  [facility], 02/28/22  Attempts:  1st Attempt  Reason for unsuccessful TCM follow-up call:  Left voice message  Johnney Killian, RN, BSN, CCM Care Management Coordinator [facility] Phone: [phone]: [phone]

## 2022-03-04 ENCOUNTER — Telehealth: Payer: Self-pay

## 2022-03-04 NOTE — Patient Outreach (Signed)
  Care Coordination [facility] Note Transition Care Management Unsuccessful Follow-up Telephone Call  Date of discharge and from where:  [facility] 02/28/22  Attempts:  2nd Attempt  Reason for unsuccessful TCM follow-up call:  Left voice message  Johnney Killian, RN, BSN, CCM Care Management Coordinator [facility] Phone: [phone]: [phone]

## 2022-03-05 ENCOUNTER — Telehealth: Payer: Self-pay

## 2022-03-05 DIAGNOSIS — N186 End stage renal disease: Secondary | ICD-10-CM | POA: Diagnosis not present

## 2022-03-05 DIAGNOSIS — R52 Pain, unspecified: Secondary | ICD-10-CM | POA: Diagnosis not present

## 2022-03-05 DIAGNOSIS — N2581 Secondary hyperparathyroidism of renal origin: Secondary | ICD-10-CM | POA: Diagnosis not present

## 2022-03-05 DIAGNOSIS — D631 Anemia in chronic kidney disease: Secondary | ICD-10-CM | POA: Diagnosis not present

## 2022-03-05 DIAGNOSIS — Z992 Dependence on renal dialysis: Secondary | ICD-10-CM | POA: Diagnosis not present

## 2022-03-05 DIAGNOSIS — I129 Hypertensive chronic kidney disease with stage 1 through stage 4 chronic kidney disease, or unspecified chronic kidney disease: Secondary | ICD-10-CM | POA: Diagnosis not present

## 2022-03-05 NOTE — Patient Outreach (Signed)
  Care Coordination [facility] Note Transition Care Management Unsuccessful Follow-up Telephone Call  Date of discharge and from where:  [name] [name] 02/28/22  Attempts:  3rd Attempt  Reason for unsuccessful TCM follow-up call:  No answer/busy  Johnney Killian, RN, BSN, CCM Care Management Coordinator [facility] Phone: [phone]: [phone]

## 2022-03-18 ENCOUNTER — Other Ambulatory Visit: Payer: Self-pay | Admitting: Family Medicine

## 2022-03-18 DIAGNOSIS — F419 Anxiety disorder, unspecified: Secondary | ICD-10-CM

## 2022-04-30 ENCOUNTER — Ambulatory Visit: Payer: Medicare Other | Admitting: Family Medicine

## 2022-06-01 ENCOUNTER — Ambulatory Visit: Payer: Medicare Other | Admitting: Family Medicine

## 2022-06-05 ENCOUNTER — Other Ambulatory Visit: Payer: Self-pay | Admitting: Family Medicine

## 2022-06-16 ENCOUNTER — Other Ambulatory Visit: Payer: Self-pay | Admitting: Family Medicine

## 2022-06-16 DIAGNOSIS — F419 Anxiety disorder, unspecified: Secondary | ICD-10-CM

## 2022-06-30 ENCOUNTER — Telehealth: Payer: Self-pay | Admitting: Family Medicine

## 2022-06-30 NOTE — Telephone Encounter (Signed)
Left message for patient to call back and schedule Medicare Annual Wellness Visit (AWV) either virtually or phone  . Left  my Herbie Drape number [phone]   Last AWV 10/30/11 please schedule with Nurse Health Adviser   45 min for awv-i and in office appointments 30 min for awv-s  phone/virtual appointments

## 2022-08-07 ENCOUNTER — Telehealth: Payer: Self-pay | Admitting: Family Medicine

## 2022-08-07 NOTE — Telephone Encounter (Signed)
Left message for patient to call back and schedule Medicare Annual Wellness Visit (AWV) in office.   If not able to come in office, please offer to do virtually or by telephone.  Left office number and my jabber [phone].  Last AWV:09/03/2012  Please schedule at anytime with Nurse Health Advisor.

## 2022-08-11 ENCOUNTER — Encounter: Payer: Self-pay | Admitting: Family Medicine

## 2022-08-19 ENCOUNTER — Encounter: Payer: Self-pay | Admitting: Family Medicine

## 2022-08-19 ENCOUNTER — Ambulatory Visit (INDEPENDENT_AMBULATORY_CARE_PROVIDER_SITE_OTHER): Payer: 59 | Admitting: Family Medicine

## 2022-08-19 VITALS — BP 126/54 | HR 81 | Ht 66.0 in | Wt 140.4 lb

## 2022-08-19 DIAGNOSIS — F411 Generalized anxiety disorder: Secondary | ICD-10-CM

## 2022-08-19 DIAGNOSIS — M5416 Radiculopathy, lumbar region: Secondary | ICD-10-CM

## 2022-08-19 MED ORDER — DICLOFENAC SODIUM 1 % EX GEL: 2.0000 g | Freq: Four times a day (QID) | CUTANEOUS | 6 refills | Status: DC

## 2022-08-19 NOTE — Progress Notes (Signed)
    SUBJECTIVE:   CHIEF COMPLAINT / HPI:   Now C/O worsening back pain with right sided "sciatica."  No trauma or unusual activity.  Has had chronic back pain with known facet arthropathy.  The leg pain is new.  No weakness.  Does not want to take any more medications.  "I'm on too many medications now."  No bowel or bladder dysfunction.  Anxiety, chronic.  Was stable until I surprised her when I told her I was retiring this week.  Somehow, she had missed the many announcements.  Because of our longstanding relationship and chronic anxiety, this came a blow to her.    OBJECTIVE:   BP (!) 126/54   Pulse 81   Ht 5\' 6"  (1.676 m)   Wt 140 lb 6.4 oz (63.7 kg)   SpO2 94%   BMI 22.66 kg/m   Gait is mostly normal with less weight placed on Right leg as she walks.   Bilateral paraspinous muscle spasm in lumbar area.  Rt>left. Mood:  Became quiet and teary when I mentioned that this would be my last visit.    ASSESSMENT/PLAN:   No problem-specific Assessment & Plan notes found for this encounter.     Zenia Resides, MD [place]

## 2022-08-19 NOTE — Patient Instructions (Addendum)
I will miss you.  I am sorry that you had not heard about my retirement. Expect a call from physical therapy to set up an appointment. I hope the rub I prescribed will help. Your new Dr. Aletha Halim be Dr. Ky Barban

## 2022-08-19 NOTE — Assessment & Plan Note (Signed)
Was fine until she learned I was retiring.  Because she has been stable, I expect her to recover quickly from this bad news.

## 2022-08-19 NOTE — Assessment & Plan Note (Signed)
She is open to physical therapy (ordered) and topical Rx - voltarin gel.

## 2022-08-26 ENCOUNTER — Telehealth: Payer: Self-pay | Admitting: Family Medicine

## 2022-08-26 NOTE — Telephone Encounter (Signed)
Called patient to schedule Medicare Annual Wellness Visit (AWV). Left message for patient to call back and schedule Medicare Annual Wellness Visit (AWV).  Last date of AWV: Hasn't had one  If patient calls back please forward message to [facility] for scheduling.   If any questions, please contact me at [phone].  Thank you ,  [name]

## 2022-09-15 ENCOUNTER — Other Ambulatory Visit: Payer: Self-pay | Admitting: Family Medicine

## 2022-09-15 DIAGNOSIS — F419 Anxiety disorder, unspecified: Secondary | ICD-10-CM

## 2022-10-12 ENCOUNTER — Other Ambulatory Visit: Payer: Self-pay

## 2022-10-12 DIAGNOSIS — F419 Anxiety disorder, unspecified: Secondary | ICD-10-CM

## 2022-10-12 DIAGNOSIS — K50013 Crohn's disease of small intestine with fistula: Secondary | ICD-10-CM

## 2022-10-13 ENCOUNTER — Encounter: Payer: Self-pay | Admitting: Family Medicine

## 2022-10-13 DIAGNOSIS — K5090 Crohn's disease, unspecified, without complications: Secondary | ICD-10-CM | POA: Insufficient documentation

## 2022-10-13 MED ORDER — MIRTAZAPINE 15 MG PO TABS: 15.0000 mg | ORAL_TABLET | Freq: Every day | ORAL | 0 refills | Status: DC

## 2022-10-13 MED ORDER — MESALAMINE 800 MG PO TBEC: 1.0000 | DELAYED_RELEASE_TABLET | Freq: Three times a day (TID) | ORAL | 0 refills | Status: DC

## 2022-10-13 NOTE — Progress Notes (Signed)
Appears to be on Mesalamine for Crohn's disease, although mostly indicated for ulcerative colitis. Med refilled. Need f/u with GI to discuss management.

## 2022-10-15 NOTE — Progress Notes (Signed)
Attempted to reach patient. No answer. Unable to LVM due to mailbox being full. Will try again. Aquilla Solian, CMA

## 2022-12-09 ENCOUNTER — Other Ambulatory Visit: Payer: Self-pay | Admitting: Family Medicine

## 2022-12-09 DIAGNOSIS — F419 Anxiety disorder, unspecified: Secondary | ICD-10-CM

## 2022-12-11 ENCOUNTER — Other Ambulatory Visit: Payer: Self-pay

## 2022-12-11 DIAGNOSIS — M5416 Radiculopathy, lumbar region: Secondary | ICD-10-CM

## 2022-12-11 MED ORDER — DICLOFENAC SODIUM 1 % EX GEL: 2.0000 g | Freq: Four times a day (QID) | CUTANEOUS | 0 refills | Status: DC

## 2022-12-11 MED ORDER — PREGABALIN 25 MG PO CAPS: 25.0000 mg | ORAL_CAPSULE | Freq: Two times a day (BID) | ORAL | 0 refills | Status: DC

## 2022-12-17 ENCOUNTER — Other Ambulatory Visit: Payer: Self-pay | Admitting: Family Medicine

## 2022-12-17 DIAGNOSIS — F419 Anxiety disorder, unspecified: Secondary | ICD-10-CM

## 2023-01-15 ENCOUNTER — Ambulatory Visit (INDEPENDENT_AMBULATORY_CARE_PROVIDER_SITE_OTHER): Payer: 59

## 2023-01-15 VITALS — Ht 66.0 in | Wt 140.0 lb

## 2023-01-15 DIAGNOSIS — Z0000 Encounter for general adult medical examination without abnormal findings: Secondary | ICD-10-CM

## 2023-01-15 DIAGNOSIS — Z1231 Encounter for screening mammogram for malignant neoplasm of breast: Secondary | ICD-10-CM

## 2023-01-15 NOTE — Patient Instructions (Addendum)
[name] [name] , Thank you for taking time to come for your Medicare Wellness Visit. I appreciate your ongoing commitment to your health goals. Please review the following plan we discussed and let me know if I can assist you in the future.   These are the goals we discussed:  Goals      Remain active and independent        This is a list of the screening recommended for you and due dates:  Health Maintenance  Topic Date Due   Zoster (Shingles) Vaccine (1 of 2) 05/18/2003   DTaP/Tdap/Td vaccine (3 - Tdap) 10/10/2019   Pneumonia Vaccine (4 of 4 - PPSV23 or PCV20) 06/13/2020   COVID-19 Vaccine (5 - 2023-24 season) 03/06/2022   Mammogram  10/02/2022   Flu Shot  02/04/2023   Medicare Annual Wellness Visit  01/15/2024   Colon Cancer Screening  02/25/2028   DEXA scan (bone density measurement)  Completed   Hepatitis C Screening  Completed   HPV Vaccine  Aged Out    Advanced directives: Information on Advanced Care Planning can be found at [facility] Advance Health Care Directives Advance Health Care Directives ([email or web address] Please bring a copy of your health care power of attorney and living will to the office to be added to your chart at your convenience.  Conditions/risks identified: Aim for 30 minutes of exercise or brisk walking, 6-8 glasses of water, and 5 servings of fruits and vegetables each day.  Next appointment: Follow up in one year for your annual wellness visit   You have an order for:  []   2D Mammogram  [x]   3D Mammogram  []   Bone Density     Please call for appointment:   [facility] [address] [place], [place] [address] [phone]    Make sure to wear two-piece clothing.  No lotions, powders, or deodorants the day of the appointment. Make sure to bring picture ID and insurance card.  Bring list of medications you are currently taking including any supplements.   Schedule your [place] screening mammogram  through MyChart!   Log into your MyChart account.  Go to 'Visit' (or 'Appointments' if on mobile App) --> Schedule an Appointment  Under 'Select a Reason for Visit' choose the Mammogram Screening option.  Complete the pre-visit questions and select the time and place that best fits your schedule.     Preventive Care 70 Years and Older, Female Preventive care refers to lifestyle choices and visits with your health care provider that can promote health and wellness. What does preventive care include? A yearly physical exam. This is also called an annual well check. Dental exams once or twice a year. Routine eye exams. Ask your health care provider how often you should have your eyes checked. Personal lifestyle choices, including: Daily care of your teeth and gums. Regular physical activity. Eating a healthy diet. Avoiding tobacco and drug use. Limiting alcohol use. Practicing safe sex. Taking low-dose aspirin every day. Taking vitamin and mineral supplements as recommended by your health care provider. What happens during an annual well check? The services and screenings done by your health care provider during your annual well check will depend on your age, overall health, lifestyle risk factors, and family history of disease. Counseling  Your health care provider may ask you questions about your: Alcohol use. Tobacco use. Drug use. Emotional well-being. Home and relationship well-being. Sexual activity. Eating habits. History of falls. Memory and  ability to understand (cognition). Work and work Astronomer. Reproductive health. Screening  You may have the following tests or measurements: Height, weight, and BMI. Blood pressure. Lipid and cholesterol levels. These may be checked every 5 years, or more frequently if you are over 56 years old. Skin check. Lung cancer screening. You may have this screening every year starting at age 54 if you have a 30-pack-year history  of smoking and currently smoke or have quit within the past 15 years. Fecal occult blood test (FOBT) of the stool. You may have this test every year starting at age 64. Flexible sigmoidoscopy or colonoscopy. You may have a sigmoidoscopy every 5 years or a colonoscopy every 10 years starting at age 15. Hepatitis C blood test. Hepatitis B blood test. Sexually transmitted disease (STD) testing. Diabetes screening. This is done by checking your blood sugar (glucose) after you have not eaten for a while (fasting). You may have this done every 1-3 years. Bone density scan. This is done to screen for osteoporosis. You may have this done starting at age 10. Mammogram. This may be done every 1-2 years. Talk to your health care provider about how often you should have regular mammograms. Talk with your health care provider about your test results, treatment options, and if necessary, the need for more tests. Vaccines  Your health care provider may recommend certain vaccines, such as: Influenza vaccine. This is recommended every year. Tetanus, diphtheria, and acellular pertussis (Tdap, Td) vaccine. You may need a Td booster every 10 years. Zoster vaccine. You may need this after age 87. Pneumococcal 13-valent conjugate (PCV13) vaccine. One dose is recommended after age 76. Pneumococcal polysaccharide (PPSV23) vaccine. One dose is recommended after age 15. Talk to your health care provider about which screenings and vaccines you need and how often you need them. This information is not intended to replace advice given to you by your health care provider. Make sure you discuss any questions you have with your health care provider. Document Released: 07/19/2015 Document Revised: 03/11/2016 Document Reviewed: 04/23/2015 Elsevier Interactive Patient Education  2017 [employer].  Fall Prevention in the Home Falls can cause injuries. They can happen to people of all ages. There are many things you can do to  make your home safe and to help prevent falls. What can I do on the outside of my home? Regularly fix the edges of walkways and driveways and fix any cracks. Remove anything that might make you trip as you walk through a door, such as a raised step or threshold. Trim any bushes or trees on the path to your home. Use bright outdoor lighting. Clear any walking paths of anything that might make someone trip, such as rocks or tools. Regularly check to see if handrails are loose or broken. Make sure that both sides of any steps have handrails. Any raised decks and porches should have guardrails on the edges. Have any leaves, snow, or ice cleared regularly. Use sand or salt on walking paths during winter. Clean up any spills in your garage right away. This includes oil or grease spills. What can I do in the bathroom? Use night lights. Install grab bars by the toilet and in the tub and shower. Do not use towel bars as grab bars. Use non-skid mats or decals in the tub or shower. If you need to sit down in the shower, use a plastic, non-slip stool. Keep the floor dry. Clean up any water that spills on the floor as soon  as it happens. Remove soap buildup in the tub or shower regularly. Attach bath mats securely with double-sided non-slip rug tape. Do not have throw rugs and other things on the floor that can make you trip. What can I do in the bedroom? Use night lights. Make sure that you have a light by your bed that is easy to reach. Do not use any sheets or blankets that are too big for your bed. They should not hang down onto the floor. Have a firm chair that has side arms. You can use this for support while you get dressed. Do not have throw rugs and other things on the floor that can make you trip. What can I do in the kitchen? Clean up any spills right away. Avoid walking on wet floors. Keep items that you use a lot in easy-to-reach places. If you need to reach something above you, use a  strong step stool that has a grab bar. Keep electrical cords out of the way. Do not use floor polish or wax that makes floors slippery. If you must use wax, use non-skid floor wax. Do not have throw rugs and other things on the floor that can make you trip. What can I do with my stairs? Do not leave any items on the stairs. Make sure that there are handrails on both sides of the stairs and use them. Fix handrails that are broken or loose. Make sure that handrails are as long as the stairways. Check any carpeting to make sure that it is firmly attached to the stairs. Fix any carpet that is loose or worn. Avoid having throw rugs at the top or bottom of the stairs. If you do have throw rugs, attach them to the floor with carpet tape. Make sure that you have a light switch at the top of the stairs and the bottom of the stairs. If you do not have them, ask someone to add them for you. What else can I do to help prevent falls? Wear shoes that: Do not have high heels. Have rubber bottoms. Are comfortable and fit you well. Are closed at the toe. Do not wear sandals. If you use a stepladder: Make sure that it is fully opened. Do not climb a closed stepladder. Make sure that both sides of the stepladder are locked into place. Ask someone to hold it for you, if possible. Clearly mark and make sure that you can see: Any grab bars or handrails. First and last steps. Where the edge of each step is. Use tools that help you move around (mobility aids) if they are needed. These include: Canes. Walkers. Scooters. Crutches. Turn on the lights when you go into a dark area. Replace any light bulbs as soon as they burn out. Set up your furniture so you have a clear path. Avoid moving your furniture around. If any of your floors are uneven, fix them. If there are any pets around you, be aware of where they are. Review your medicines with your doctor. Some medicines can make you feel dizzy. This can  increase your chance of falling. Ask your doctor what other things that you can do to help prevent falls. This information is not intended to replace advice given to you by your health care provider. Make sure you discuss any questions you have with your health care provider. Document Released: 04/18/2009 Document Revised: 11/28/2015 Document Reviewed: 07/27/2014 Elsevier Interactive Patient Education  2017 [employer].

## 2023-01-15 NOTE — Progress Notes (Signed)
Subjective:   [name] [name] is a 72 y.o. female who presents for Medicare Annual (Subsequent) preventive examination.  Visit Complete: Virtual  I connected with  [name] [name] on 01/15/23 by a audio enabled telemedicine application and verified that I am speaking with the correct person using two identifiers.  Patient Location: Home  Provider Location: Home Office  I discussed the limitations of evaluation and management by telemedicine. The patient expressed understanding and agreed to proceed.  Review of Systems     Cardiac Risk Factors include: advanced age (>80men, >24 women);sedentary lifestyle     Objective:    Today's Vitals   01/15/23 1522  Weight: 140 lb (63.5 kg)  Height: 5\' 6"  (1.676 m)   Body mass index is 22.6 kg/m.     01/15/2023    4:24 PM 08/19/2022    8:44 AM 10/27/2021    9:40 AM 06/19/2021   12:39 PM 04/25/2021    3:00 AM 03/25/2020   10:11 AM 01/03/2020    6:07 PM  Advanced Directives  Does Patient Have a Medical Advance Directive? No No No No No No No  Would patient like information on creating a medical advance directive? Yes (MAU/Ambulatory/Procedural Areas - Information given) No - Patient declined No - Patient declined  No - Patient declined No - Patient declined No - Patient declined    Current Medications (verified) Outpatient Encounter Medications as of 01/15/2023  Medication Sig   atorvastatin (LIPITOR) 40 MG tablet Take 1 tablet (40 mg total) by mouth daily.   citalopram (CELEXA) 10 MG tablet Take 1 tablet (10 mg total) by mouth daily.   clopidogrel (PLAVIX) 75 MG tablet Take 1 tablet (75 mg total) by mouth daily.   diclofenac Sodium (VOLTAREN ARTHRITIS PAIN) 1 % GEL Apply 2 g topically 4 (four) times daily.   LORazepam (ATIVAN) 0.5 MG tablet TAKE ONE TABLET BY MOUTH TWICE DAILY AS NEEDED FOR ANXIETY   Mesalamine 800 MG TBEC Take 1 tablet (800 mg total) by mouth 3 (three) times daily.   mirtazapine (REMERON) 15 MG tablet Take 1 tablet (15 mg  total) by mouth at bedtime.   pregabalin (LYRICA) 25 MG capsule Take 1 capsule (25 mg total) by mouth 2 (two) times daily.   No facility-administered encounter medications on file as of 01/15/2023.    Allergies (verified) Amoxicillin, Aspirin, Ciprofloxacin, Morphine and codeine, Penicillins, Gabapentin, and Ace inhibitors   History: Past Medical History:  Diagnosis Date   Abnormal chest CT    Coronary atherosclerosis on chest CT 2012   Anemia, chronic renal failure    Anxiety    Asthma 05/2011   CAD (coronary artery disease)    Carpal tunnel syndrome    CHF (congestive heart failure) (HCC)    EF 30-35% 2012->EF 60-65% 2013   CKD (chronic kidney disease), stage III (HCC)    Crohn's disease (HCC)    GERD (gastroesophageal reflux disease)    Headache(784.0)    "related to high BP" (05/30/2012)   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 Ernesto Rutherford   CATARACT EXTRACTION W/ INTRAOCULAR LENS  IMPLANT, BILATERAL  ~ 2000   CHOLECYSTECTOMY  01/28/2005   COLOSTOMY  03/1996   diverting   ECTOPIC PREGNANCY SURGERY  ?1980's   left   EYE SURGERY Bilateral    Cat Sx - Dr. Ernesto Rutherford   ILEOSTOMY  ?  2002   IR FLUORO GUIDE CV LINE RIGHT  04/25/2021   IR US GUIDE VASC ACCESS RIGHT  04/25/2021   NECK SURGERY  2020   "pinched nerve"   Family History  Problem Relation Age of Onset   Heart disease Mother    Other Mother        Covid   Glaucoma Mother    Stroke Father    Other Sister         AIDS   Social History   Socioeconomic History   Marital status: Single    Spouse name: Not on file   Number of children: 1   Years of education: associate   Highest education level: Not on file  Occupational History   Occupation: Retired-cook    Employer: [employer]  Tobacco Use   Smoking status: Every Day    Current packs/day: 0.12    Average packs/day: 0.1 packs/day for 30.0 years (3.6 ttl pk-yrs)    Types: Cigarettes   Smokeless tobacco: Never  Substance and Sexual Activity   Alcohol use: No    Comment: 05/30/2012 "used to drink back in the day; last alcohol 23 yr ago"   Drug use: No   Sexual activity: Not Currently  Other Topics Concern   Not on file  Social History Narrative   Health Care POA:    Emergency Contact: partner, Mindi Junker, [phone]   End of Life Plan:    Who lives with you: no one 09/13/19, no pets   Any pets: cat- Wild Thing   Diet: pt has a varied diet of protein, starch, and vegetables    Exercise: Pt does not have any regular exercise routine   Seatbelts:Pt reports wearing seatbelt when in vehicles.    Wynelle Link Exposure/Protection:    Hobbies: cooking, bingo, basketball         Social Determinants of Health   Financial Resource Strain: Low Risk  (01/15/2023)   Overall Financial Resource Strain (CARDIA)    Difficulty of Paying Living Expenses: Not hard at all  Food Insecurity: No Food Insecurity (01/15/2023)   Hunger Vital Sign    Worried About Running Out of Food in the Last Year: Never true    Ran Out of Food in the Last Year: Never true  Transportation Needs: No Transportation Needs (01/15/2023)   PRAPARE - Administrator, Civil Service (Medical): No    Lack of Transportation (Non-Medical): No  Physical Activity: Inactive (01/15/2023)   Exercise Vital Sign    Days of Exercise per Week: 0 days    Minutes of Exercise per Session: 0 min  Stress: No Stress Concern Present (01/15/2023)   [employer] of Occupational  Health - Occupational Stress Questionnaire    Feeling of Stress : Not at all  Social Connections: Moderately Isolated (01/15/2023)   Social Connection and Isolation Panel [NHANES]    Frequency of Communication with Friends and Family: More than three times a week    Frequency of Social Gatherings with Friends and Family:  Three times a week    Attends Religious Services: More than 4 times per year    Active Member of Clubs or Organizations: No    Attends Banker Meetings: Never    Marital Status: Never married    Tobacco Counseling Ready to quit: Not Answered Counseling given: Not Answered   Clinical Intake:  Pre-visit preparation completed: Yes  Pain : No/denies pain     Diabetes: No  How often do you need to have someone help you when you read instructions, pamphlets, or other written materials from your doctor or pharmacy?: 1 - Never  Interpreter Needed?: No  Information entered by :: Kandis Fantasia LPN   Activities of Daily Living    01/15/2023    4:23 PM  In your present state of health, do you have any difficulty performing the following activities:  Hearing? 0  Vision? 0  Difficulty concentrating or making decisions? 0  Walking or climbing stairs? 0  Dressing or bathing? 0  Doing errands, shopping? 0  Preparing Food and eating ? N  Using the Toilet? N  In the past six months, have you accidently leaked urine? N  Do you have problems with loss of bowel control? N  Managing your Medications? N  Managing your Finances? N  Housekeeping or managing your Housekeeping? N    Patient Care Team: Caro Laroche, DO as PCP - General (Family Medicine) O'Neal, Ronnald Ramp, MD as PCP - Cardiology (Cardiology) Ernesto Rutherford, MD (Ophthalmology) Zachery Dakins Anselm Pancoast, MD (General Surgery) Dorena Cookey, MD (Inactive) (Gastroenterology) Nahser, Deloris Ping, MD (Cardiology) Cindee Salt, MD as Referring Physician (Orthopedic Surgery)  Indicate any recent  Medical Services you may have received from other than [facility] providers in the past year (date may be approximate).     Assessment:   This is a routine wellness examination for [place].  Hearing/Vision screen Hearing Screening - Comments:: Denies hearing difficulties   Vision Screening - Comments:: Wears rx glasses - up to date with routine eye exams with [facility]    Dietary issues and exercise activities discussed:     Goals Addressed             This Visit's Progress    Remain active and independent        Depression Screen    01/15/2023    4:24 PM 08/19/2022    8:48 AM 10/27/2021    9:41 AM 09/22/2021   10:22 AM 05/19/2021   10:17 AM 04/15/2020   11:32 AM 03/25/2020   10:17 AM  PHQ 2/9 Scores  PHQ - 2 Score 2 2 0 0 1 1 4   PHQ- 9 Score 7 8 2 2 4 4 10     Fall Risk    01/15/2023    4:23 PM 08/19/2022    8:44 AM 10/27/2021    9:41 AM 03/25/2020   10:11 AM 02/05/2020   10:21 AM  Fall Risk   Falls in the past year? 0 1 0 0 0  Number falls in past yr: 0 0 0  0  Injury with Fall? 0  0    Risk for fall due to : No Fall Risks      Follow up Falls prevention discussed;Education provided;Falls evaluation completed    Falls evaluation completed    MEDICARE RISK AT HOME:  Medicare Risk at Home - 01/15/23 1623     Any stairs in or around the home? No    If so, are there any  without handrails? No    Home free of loose throw rugs in walkways, pet beds, electrical cords, etc? Yes    Adequate lighting in your home to reduce risk of falls? Yes    Life alert? No    Use of a cane, walker or w/c? No    Grab bars in the bathroom? Yes    Shower chair or bench in shower? No    Elevated toilet seat or a handicapped toilet? No             TIMED UP AND GO:  Was the test performed?  No    Cognitive Function:    09/23/2011    4:00 PM  MMSE - Mini Mental State Exam  Orientation to time 5  Orientation to Place 5  Registration 3  Attention/ Calculation 5  Recall 3   Language- name 2 objects 2  Language- repeat 1  Language- follow 3 step command 3  Language- read & follow direction 1  Write a sentence 1  Copy design 1  Total score 30        01/15/2023    4:24 PM  6CIT Screen  What Year? 0 points  What month? 0 points  What time? 0 points  Count back from 20 0 points  Months in reverse 0 points  Repeat phrase 0 points  Total Score 0 points    Immunizations Immunization History  Administered Date(s) Administered   Fluad Quad(high Dose 65+) 05/26/2019   Hepb-cpg 06/19/2021, 07/17/2021   Influenza Split 04/29/2012   Influenza Whole 06/17/2007, 05/09/2008, 05/07/2009, 05/09/2010   Influenza, High Dose Seasonal PF 05/26/2019   Influenza,inj,Quad PF,6+ Mos 09/19/2014, 03/19/2016, 03/25/2020   Influenza-Unspecified 05/09/2011, 04/29/2012, 05/06/2013, 05/17/2013, 09/19/2014, 03/19/2016, 05/08/2021   PFIZER(Purple Top)SARS-COV-2 Vaccination 09/16/2019, 10/07/2019, 04/15/2020   Pfizer Covid-19 Vaccine Bivalent Booster 27yrs & up 05/19/2021   Pneumococcal Conjugate-13 06/14/2019   Pneumococcal Polysaccharide-23 04/06/1999, 05/17/2013   Td 03/07/1999, 10/09/2009   Zoster, Live 01/19/2014    TDAP status: Due, Education has been provided regarding the importance of this vaccine. Advised may receive this vaccine at local pharmacy or Health Dept. Aware to provide a copy of the vaccination record if obtained from local pharmacy or Health Dept. Verbalized acceptance and understanding.  Pneumococcal vaccine status: Up to date  Covid-19 vaccine status: Information provided on how to obtain vaccines.   Qualifies for Shingles Vaccine? Yes   Zostavax completed Yes   Shingrix Completed?: No.    Education has been provided regarding the importance of this vaccine. Patient has been advised to call insurance company to determine out of pocket expense if they have not yet received this vaccine. Advised may also receive vaccine at local pharmacy or Health  Dept. Verbalized acceptance and understanding.  Screening Tests Health Maintenance  Topic Date Due   Zoster Vaccines- Shingrix (1 of 2) 05/18/2003   DTaP/Tdap/Td (3 - Tdap) 10/10/2019   Pneumonia Vaccine 82+ Years old (4 of 4 - PPSV23 or PCV20) 06/13/2020   COVID-19 Vaccine (5 - 2023-24 season) 03/06/2022   MAMMOGRAM  10/02/2022   INFLUENZA VACCINE  02/04/2023   Medicare Annual Wellness (AWV)  01/15/2024   Colonoscopy  02/25/2028   DEXA SCAN  Completed   Hepatitis C Screening  Completed   HPV VACCINES  Aged Out    Health Maintenance  Health Maintenance Due  Topic Date Due   Zoster Vaccines- Shingrix (1 of 2) 05/18/2003   DTaP/Tdap/Td (3 - Tdap) 10/10/2019   Pneumonia Vaccine 65+ Years  old (4 of 4 - PPSV23 or PCV20) 06/13/2020   COVID-19 Vaccine (5 - 2023-24 season) 03/06/2022   MAMMOGRAM  10/02/2022    Colorectal cancer screening: Type of screening: Colonoscopy. Completed 02/24/18. Repeat every 10 years  Mammogram status: Completed 10/01/21. Repeat every year (ordered today)  Bone Density status: Completed 09/30/11. Results reflect: Bone density results: OSTEOPENIA. Repeat every 2 years.  Lung Cancer Screening: (Low Dose CT Chest recommended if Age 63-80 years, 20 pack-year currently smoking OR have quit w/in 15years.) does not qualify.   Lung Cancer Screening Referral: n/a  Additional Screening:  Hepatitis C Screening: does qualify; Completed 03/19/16  Vision Screening: Recommended annual ophthalmology exams for early detection of glaucoma and other disorders of the eye. Is the patient up to date with their annual eye exam?  Yes  Who is the provider or what is the name of the office in which the patient attends annual eye exams? [facility] If pt is not established with a provider, would they like to be referred to a provider to establish care? No .   Dental Screening: Recommended annual dental exams for proper oral hygiene  Community Resource Referral / Chronic Care  Management: CRR required this visit?  No   CCM required this visit?  No     Plan:     I have personally reviewed and noted the following in the patient's chart:   Medical and social history Use of alcohol, tobacco or illicit drugs  Current medications and supplements including opioid prescriptions. Patient is not currently taking opioid prescriptions. Functional ability and status Nutritional status Physical activity Advanced directives List of other physicians Hospitalizations, surgeries, and ER visits in previous 12 months Vitals Screenings to include cognitive, depression, and falls Referrals and appointments  In addition, I have reviewed and discussed with patient certain preventive protocols, quality metrics, and best practice recommendations. A written personalized care plan for preventive services as well as general preventive health recommendations were provided to patient.     Kandis Fantasia [place], [place]   1/61/0960   After Visit Summary: (Mail) Due to this being a telephonic visit, the after visit summary with patients personalized plan was offered to patient via mail   Nurse Notes: No concerns

## 2023-01-19 ENCOUNTER — Other Ambulatory Visit: Payer: Self-pay | Admitting: Family Medicine

## 2023-01-19 DIAGNOSIS — K50013 Crohn's disease of small intestine with fistula: Secondary | ICD-10-CM

## 2023-01-19 NOTE — Progress Notes (Deleted)
    SUBJECTIVE:   CHIEF COMPLAINT / HPI:   Renal artery stenosis, HFrEF, CAD, hypotension, HLD: - Medications: lipitor, plavix - Compliance: *** - Checking BP at home: *** - Denies any SOB, CP, vision changes, LE edema, medication SEs, or symptoms of hypotension - Diet: *** - Exercise: ***  ESRD on HD - ***. Previously considered for transplant, patient decided not to pursue.  Crohn's - s/p colectomy, permanent ileostomy 1990s. On mesalamine. Follows with ***.  Anxiety - on citalopram, remeron, lorazepam at bedtime.  LBP - lyrica BID.  Health Maintenance - due for shingrix, Tdap, PCV20. Mammogram previously ordered.  OBJECTIVE:   There were no vitals taken for this visit.  ***  ASSESSMENT/PLAN:   No problem-specific Assessment & Plan notes found for this encounter.     Caro Laroche, DO

## 2023-01-20 ENCOUNTER — Ambulatory Visit: Payer: 59 | Admitting: Family Medicine

## 2023-02-10 ENCOUNTER — Other Ambulatory Visit: Payer: Self-pay | Admitting: Family Medicine

## 2023-02-10 DIAGNOSIS — F419 Anxiety disorder, unspecified: Secondary | ICD-10-CM

## 2023-02-16 ENCOUNTER — Other Ambulatory Visit: Payer: Self-pay | Admitting: Family Medicine

## 2023-02-18 NOTE — Telephone Encounter (Signed)
Request is too soon. Last refill 02/11/23 for 30 days.  Requested Prescriptions  Pending Prescriptions Disp Refills   pregabalin (LYRICA) 25 MG capsule [Pharmacy Med Name: pregabalin 25 mg capsule] 60 capsule 0    Sig: TAKE ONE CAPSULE BY MOUTH TWICE DAILY     There is no refill protocol information for this order

## 2023-03-03 ENCOUNTER — Ambulatory Visit
Admission: RE | Admit: 2023-03-03 | Discharge: 2023-03-03 | Disposition: A | Discharge status: Home / Self Care | Payer: 59 | Source: Ambulatory Visit | Attending: Family Medicine | Admitting: Family Medicine

## 2023-03-03 DIAGNOSIS — Z1231 Encounter for screening mammogram for malignant neoplasm of breast: Secondary | ICD-10-CM

## 2023-03-19 ENCOUNTER — Other Ambulatory Visit: Payer: Self-pay | Admitting: Family Medicine

## 2023-03-19 DIAGNOSIS — F419 Anxiety disorder, unspecified: Secondary | ICD-10-CM

## 2023-03-23 ENCOUNTER — Encounter: Payer: Self-pay | Admitting: Family Medicine

## 2023-03-23 DIAGNOSIS — Z933 Colostomy status: Secondary | ICD-10-CM | POA: Insufficient documentation

## 2023-04-01 NOTE — Telephone Encounter (Signed)
Called and lvm for patient to call back and schedule appointment.   Thanks [employer]

## 2023-04-10 ENCOUNTER — Other Ambulatory Visit: Payer: Self-pay | Admitting: Family Medicine

## 2023-04-10 DIAGNOSIS — F419 Anxiety disorder, unspecified: Secondary | ICD-10-CM

## 2023-04-20 ENCOUNTER — Other Ambulatory Visit: Payer: Self-pay | Admitting: Family Medicine

## 2023-04-20 DIAGNOSIS — F419 Anxiety disorder, unspecified: Secondary | ICD-10-CM

## 2023-04-22 ENCOUNTER — Other Ambulatory Visit: Payer: Self-pay | Admitting: Family Medicine

## 2023-05-18 ENCOUNTER — Other Ambulatory Visit: Payer: Self-pay

## 2023-05-18 MED ORDER — ATORVASTATIN CALCIUM 40 MG PO TABS: 40.0000 mg | ORAL_TABLET | Freq: Every day | ORAL | 3 refills | Status: AC

## 2023-05-27 ENCOUNTER — Other Ambulatory Visit: Payer: Self-pay | Admitting: Family Medicine

## 2023-05-27 DIAGNOSIS — M5416 Radiculopathy, lumbar region: Secondary | ICD-10-CM

## 2023-05-31 ENCOUNTER — Encounter: Payer: Self-pay | Admitting: Family Medicine

## 2023-05-31 NOTE — Telephone Encounter (Signed)
Called and scheduled appointment.   Thanks [employer]

## 2023-06-04 ENCOUNTER — Other Ambulatory Visit: Payer: Self-pay | Admitting: Family Medicine

## 2023-06-04 DIAGNOSIS — F419 Anxiety disorder, unspecified: Secondary | ICD-10-CM

## 2023-06-07 ENCOUNTER — Encounter: Payer: Self-pay | Admitting: Family Medicine

## 2023-06-07 ENCOUNTER — Ambulatory Visit (INDEPENDENT_AMBULATORY_CARE_PROVIDER_SITE_OTHER): Payer: 59 | Admitting: Family Medicine

## 2023-06-07 VITALS — BP 121/64 | HR 90 | Ht 66.0 in | Wt 129.8 lb

## 2023-06-07 DIAGNOSIS — Z87898 Personal history of other specified conditions: Secondary | ICD-10-CM

## 2023-06-07 DIAGNOSIS — I5020 Unspecified systolic (congestive) heart failure: Secondary | ICD-10-CM

## 2023-06-07 DIAGNOSIS — Z23 Encounter for immunization: Secondary | ICD-10-CM | POA: Diagnosis not present

## 2023-06-07 DIAGNOSIS — Z933 Colostomy status: Secondary | ICD-10-CM

## 2023-06-07 DIAGNOSIS — N186 End stage renal disease: Secondary | ICD-10-CM

## 2023-06-07 DIAGNOSIS — M5416 Radiculopathy, lumbar region: Secondary | ICD-10-CM

## 2023-06-07 DIAGNOSIS — I959 Hypotension, unspecified: Secondary | ICD-10-CM

## 2023-06-07 DIAGNOSIS — F419 Anxiety disorder, unspecified: Secondary | ICD-10-CM | POA: Diagnosis not present

## 2023-06-07 DIAGNOSIS — G609 Hereditary and idiopathic neuropathy, unspecified: Secondary | ICD-10-CM

## 2023-06-07 DIAGNOSIS — K5090 Crohn's disease, unspecified, without complications: Secondary | ICD-10-CM | POA: Diagnosis not present

## 2023-06-07 DIAGNOSIS — Z992 Dependence on renal dialysis: Secondary | ICD-10-CM

## 2023-06-07 DIAGNOSIS — F411 Generalized anxiety disorder: Secondary | ICD-10-CM

## 2023-06-07 MED ORDER — CITALOPRAM HYDROBROMIDE 10 MG PO TABS: 10.0000 mg | ORAL_TABLET | Freq: Every day | ORAL | 3 refills | Status: DC

## 2023-06-07 MED ORDER — LORAZEPAM 0.5 MG PO TABS: 0.5000 mg | ORAL_TABLET | Freq: Two times a day (BID) | ORAL | 0 refills | Status: DC | PRN

## 2023-06-07 NOTE — Patient Instructions (Signed)
It was great to see you!  Our plans for today:  - Get your shingles vaccine and your tetanus vaccine at your pharmacy.  - Come back in about 2-4 weeks for follow up and bring all of your medications with you when you come.  - We are referring you to the GI doctor for your Crohn disease. Let us know if you don't hear about an appointment in the next few weeks.   Take care and seek immediate care sooner if you develop any concerns.   Dr. Linwood Dibbles

## 2023-06-07 NOTE — Progress Notes (Unsigned)
    SUBJECTIVE:   CHIEF COMPLAINT / HPI:   CAD, HFrEF, HLD: - Medications: lipitor, imdur, midodrine (on HD days), coreg - Compliance: not usre if taking coreg or imdur - Checking BP at home: yes, 70-80s SBP on HD days. Midodrine will increase to 90s SBP - Denies any SOB, CP, vision changes, LE edema, medication SEs, or symptoms of hypotension - Diet: *** - Exercise: *** - still smoking? 1 pack will last 3 weeks. Smokes when nerves are bad.  Eczema - triamcinolone. Mostly on bottom, works well.  ESRD - follows with [facility]. HD TTS. Taking iron,   Crohn disease - s/p colostomy, doing well. Mesalamine. Not following with GI, previously saw across the street but old doc retired. No blood. No trouble managing pouch.   GAD - remeron, celexa, ativan prn. Taking ativan twice daily a few times per week.   Sciatica - Lyrica. S/p cervical fusion***  H/o seizure? - 2012, once.  ***GI. Med rec PCV20  OBJECTIVE:   BP 121/64   Pulse 90   Ht 5\' 6"  (1.676 m)   Wt 129 lb 12.8 oz (58.9 kg)   SpO2 98%   BMI 20.95 kg/m   ***  ASSESSMENT/PLAN:   No problem-specific Assessment & Plan notes found for this encounter.     Caro Laroche, DO

## 2023-06-08 NOTE — Assessment & Plan Note (Signed)
HFrEF with previous ECHO 25-30%, recovered on last ECHO, thought maybe 2/2 viral etiology? Last Cardiology f/u 2021 when EF back down to 30%. Unsure which meds she is taking, will f/u in 2-4 weeks for med rec. Likely doesn't need imdur refilled if not taking (seems not filled in few years?) as asymptomatic and EF recovered. Euvolemic on exam today.

## 2023-06-08 NOTE — Assessment & Plan Note (Signed)
Tolerating HD 

## 2023-06-08 NOTE — Assessment & Plan Note (Signed)
Remains stable off antiepileptics.

## 2023-06-08 NOTE — Assessment & Plan Note (Signed)
Doing well on current regimen, no changes made today. 

## 2023-06-08 NOTE — Assessment & Plan Note (Signed)
Normotensive today, HD tomorrow with midodrine.

## 2023-06-08 NOTE — Assessment & Plan Note (Signed)
Stable on current regimen. PDMP reviewed with appropriate Ativan use, refill provided.

## 2023-06-08 NOTE — Assessment & Plan Note (Signed)
S/p colostomy but not evaluated by GI in many years and remains on mesalamine. Will refer for reevaluation.

## 2023-06-22 ENCOUNTER — Other Ambulatory Visit: Payer: Self-pay | Admitting: Family Medicine

## 2023-06-22 DIAGNOSIS — M5416 Radiculopathy, lumbar region: Secondary | ICD-10-CM

## 2023-06-23 NOTE — Progress Notes (Signed)
Office Note   History of Present Illness   [name] [name] is a 72 y.o. (Nov 28, 1950) female who presents for dialysis access evaluation.  She has a history of right upper arm AV graft creation on 04/28/2021 by Dr. Edilia Bo.  This was the patient's first permanent dialysis access.  She presents today for repeat evaluation.  She states a couple of months ago her right upper arm AV graft occluded.  Since then she has been using a right sided tunneled dialysis catheter.  Prior to occlusion of the graft, the patient was having issues with hypotension.  She was told by her dialysis center that her graft likely occluded due to her low blood pressure.  Her blood pressure has been improving on midodrine.    She dialyzes on Tuesdays, Thursdays, and Saturdays. Current Outpatient Medications  Medication Sig Dispense Refill   Ascorbic Acid 500 MG CAPS Take by mouth.     atorvastatin (LIPITOR) 40 MG tablet Take 1 tablet (40 mg total) by mouth daily. 90 tablet 3   Biotin 1000 MCG tablet Take by mouth.     carvedilol (COREG) 12.5 MG tablet Take 12.5 mg by mouth 2 (two) times daily with a meal.     citalopram (CELEXA) 10 MG tablet Take 1 tablet (10 mg total) by mouth daily. 90 tablet 3   diclofenac Sodium (VOLTAREN) 1 % GEL APPLY TWO GRAMS TOPICAALY FOUR TIMES DAILY 100 g 0   Ferrous Fumarate (HEMOCYTE - 106 MG FE) 324 (106 Fe) MG TABS tablet Take 27 mg of iron by mouth daily.     isosorbide mononitrate (IMDUR) 30 MG 24 hr tablet Take 15 mg by mouth daily.     LOPERAMIDE HCL PO Take by mouth.     LORazepam (ATIVAN) 0.5 MG tablet Take 1 tablet (0.5 mg total) by mouth 2 (two) times daily as needed. for anxiety 60 tablet 0   Mesalamine 800 MG TBEC Take 1 tablet (800 mg total) by mouth 3 (three) times daily. 270 tablet 2   midodrine (PROAMATINE) 10 MG tablet Take 10 mg by mouth 3 (three) times a week.     mirtazapine (REMERON) 15 MG tablet TAKE ONE TABLET BY MOUTH AT BEDTIME 90 tablet 0   nystatin (MYCOSTATIN)  100000 UNIT/ML suspension Take 5 mLs by mouth 4 (four) times daily as needed.     pregabalin (LYRICA) 25 MG capsule Take 1 capsule (25 mg total) by mouth 2 (two) times daily. 14 capsule 0   sodium bicarbonate 650 MG tablet Take 1,300 mg by mouth 2 (two) times daily.     triamcinolone ointment (KENALOG) 0.1 % Apply 1 Application topically daily as needed.     No current facility-administered medications for this visit.    REVIEW OF SYSTEMS (negative unless checked):   Cardiac:  []  Chest pain or chest pressure? []  Shortness of breath upon activity? []  Shortness of breath when lying flat? []  Irregular heart rhythm?  Vascular:  []  Pain in calf, thigh, or hip brought on by walking? []  Pain in feet at night that wakes you up from your sleep? []  Blood clot in your veins? []  Leg swelling?  Pulmonary:  []  Oxygen at home? []  Productive cough? []  Wheezing?  Neurologic:  []  Sudden weakness in arms or legs? []  Sudden numbness in arms or legs? []  Sudden onset of difficult speaking or slurred speech? []  Temporary loss of vision in one eye? []  Problems with dizziness?  Gastrointestinal:  []  Blood in stool? []  Vomited  blood?  Genitourinary:  []  Burning when urinating? []  Blood in urine?  Psychiatric:  []  Major depression  Hematologic:  []  Bleeding problems? []  Problems with blood clotting?  Dermatologic:  []  Rashes or ulcers?  Constitutional:  []  Fever or chills?  Ear/Nose/Throat:  []  Change in hearing? []  Nose bleeds? []  Sore throat?  Musculoskeletal:  []  Back pain? []  Joint pain? []  Muscle pain?   Physical Examination   Vitals:   06/25/23 0916  BP: 100/62  Pulse: 92  Resp: (!) 22  Temp: 98 F (36.7 C)  TempSrc: Temporal  SpO2: 95%  Weight: 134 lb 9.6 oz (61.1 kg)  Height: 5\' 6"  (1.676 m)   Body mass index is 21.73 kg/m.  General:  WDWN in NAD; vital signs documented above.  Right IJ TDC in place Gait: Not observed HENT: WNL,  normocephalic Pulmonary: normal non-labored breathing , without rales, rhonchi,  wheezing Cardiac: regular Abdomen: soft, NT, no masses.  Left-sided ostomy bag Skin: without rashes Vascular Exam/Pulses: Palpable right radial pulse.  Palpable left radial and brachial pulses Extremities: occluded right upper arm AVG Musculoskeletal: no muscle wasting or atrophy  Neurologic: A&O X 3;  No focal weakness or paresthesias are detected Psychiatric:  The pt has Normal affect    Medical Decision Making   [name] [name] is a 72 y.o. female who presents for dialysis access  The patient has a history of ESRD with previous right upper arm AV graft created in 2022.  Unfortunately a couple of months ago the patient was dealing with hypotension issues, which likely led to graft occlusion.  Since then she has been dialyzing via right IJ TDC.  Her blood pressure has been improving on midodrine She returns today for repeat evaluation of permanent dialysis access.  She did not receive any vein mapping.  Her previous vein mapping in 2022 did not show any sizable veins for fistula On exam she has easily palpable left radial brachial pulses.  I explained to the patient her next dialysis access option would be in her left arm.  Potentially this could be a fistula, however given her small veins it would likely end up being in AV graft. I have explained to her the risks and benefits of dialysis access placement including infection, graft thrombosis, steal syndrome, bleeding, healing issues, and need for subsequent interventions.  She is agreeable to pursue with access in the left arm.  We can schedule her for left upper arm AV fistula versus AV graft with Dr. Hetty Blend on a nondialysis day within the next several weeks.   Loel Dubonnet PA-C [facility] Office: [phone]  Clinic MD: Hetty Blend

## 2023-06-24 NOTE — Telephone Encounter (Signed)
Called patient per Dr. Madelaine Etienne request to ask if she was still taking the medication Isosorbide Mononitrate 30mg  24hr tablet.  Patient stated that she is no longer taking that medication.  Patient also started that she is out of her Pregablin 25mg  capsule and would need a refill on that medication, please.  Drusilla Kanner, CMA

## 2023-06-25 ENCOUNTER — Other Ambulatory Visit: Payer: Self-pay

## 2023-06-25 ENCOUNTER — Encounter: Payer: Self-pay | Admitting: Physician Assistant

## 2023-06-25 ENCOUNTER — Ambulatory Visit (INDEPENDENT_AMBULATORY_CARE_PROVIDER_SITE_OTHER): Payer: 59 | Admitting: Physician Assistant

## 2023-06-25 VITALS — BP 100/62 | HR 92 | Temp 98.0°F | Resp 22 | Ht 66.0 in | Wt 134.6 lb

## 2023-06-25 DIAGNOSIS — Z992 Dependence on renal dialysis: Secondary | ICD-10-CM | POA: Diagnosis not present

## 2023-06-25 DIAGNOSIS — N186 End stage renal disease: Secondary | ICD-10-CM

## 2023-06-25 MED ORDER — PREGABALIN 25 MG PO CAPS: 25.0000 mg | ORAL_CAPSULE | Freq: Two times a day (BID) | ORAL | 0 refills | Status: DC

## 2023-06-25 NOTE — Telephone Encounter (Signed)
Patient calls nurse line requesting refill on pregabalin.   She reports that she had to reschedule her appointment for 12/23, as dialysis schedule changed next week due to the holidays.  She has rescheduled appointment for 07/12/23.  Will forward request to PCP.   Veronda Prude, RN

## 2023-06-28 ENCOUNTER — Ambulatory Visit: Payer: 59 | Admitting: Family Medicine

## 2023-06-29 ENCOUNTER — Telehealth: Payer: Self-pay

## 2023-06-29 NOTE — Telephone Encounter (Signed)
Attempted to reach patient to schedule left arm AV fistula vs graft. Left VM for patient to return call.

## 2023-07-02 ENCOUNTER — Telehealth: Payer: Self-pay

## 2023-07-02 NOTE — Telephone Encounter (Signed)
Attempted to reach pt to schedule her surgery. LVM for her to return our call.

## 2023-07-08 DIAGNOSIS — N186 End stage renal disease: Secondary | ICD-10-CM | POA: Diagnosis not present

## 2023-07-08 DIAGNOSIS — R52 Pain, unspecified: Secondary | ICD-10-CM | POA: Diagnosis not present

## 2023-07-08 DIAGNOSIS — Z992 Dependence on renal dialysis: Secondary | ICD-10-CM | POA: Diagnosis not present

## 2023-07-08 DIAGNOSIS — N2581 Secondary hyperparathyroidism of renal origin: Secondary | ICD-10-CM | POA: Diagnosis not present

## 2023-07-08 DIAGNOSIS — D631 Anemia in chronic kidney disease: Secondary | ICD-10-CM | POA: Diagnosis not present

## 2023-07-08 DIAGNOSIS — D689 Coagulation defect, unspecified: Secondary | ICD-10-CM | POA: Diagnosis not present

## 2023-07-08 DIAGNOSIS — T8249XA Other complication of vascular dialysis catheter, initial encounter: Secondary | ICD-10-CM | POA: Diagnosis not present

## 2023-07-10 DIAGNOSIS — Z992 Dependence on renal dialysis: Secondary | ICD-10-CM | POA: Diagnosis not present

## 2023-07-10 DIAGNOSIS — D689 Coagulation defect, unspecified: Secondary | ICD-10-CM | POA: Diagnosis not present

## 2023-07-10 DIAGNOSIS — N186 End stage renal disease: Secondary | ICD-10-CM | POA: Diagnosis not present

## 2023-07-10 DIAGNOSIS — R52 Pain, unspecified: Secondary | ICD-10-CM | POA: Diagnosis not present

## 2023-07-10 DIAGNOSIS — N2581 Secondary hyperparathyroidism of renal origin: Secondary | ICD-10-CM | POA: Diagnosis not present

## 2023-07-10 DIAGNOSIS — D631 Anemia in chronic kidney disease: Secondary | ICD-10-CM | POA: Diagnosis not present

## 2023-07-10 DIAGNOSIS — T8249XA Other complication of vascular dialysis catheter, initial encounter: Secondary | ICD-10-CM | POA: Diagnosis not present

## 2023-07-12 ENCOUNTER — Ambulatory Visit: Payer: 59 | Admitting: Family Medicine

## 2023-07-13 DIAGNOSIS — R52 Pain, unspecified: Secondary | ICD-10-CM | POA: Diagnosis not present

## 2023-07-13 DIAGNOSIS — Z992 Dependence on renal dialysis: Secondary | ICD-10-CM | POA: Diagnosis not present

## 2023-07-13 DIAGNOSIS — D631 Anemia in chronic kidney disease: Secondary | ICD-10-CM | POA: Diagnosis not present

## 2023-07-13 DIAGNOSIS — D689 Coagulation defect, unspecified: Secondary | ICD-10-CM | POA: Diagnosis not present

## 2023-07-13 DIAGNOSIS — N2581 Secondary hyperparathyroidism of renal origin: Secondary | ICD-10-CM | POA: Diagnosis not present

## 2023-07-13 DIAGNOSIS — N186 End stage renal disease: Secondary | ICD-10-CM | POA: Diagnosis not present

## 2023-07-13 DIAGNOSIS — T8249XA Other complication of vascular dialysis catheter, initial encounter: Secondary | ICD-10-CM | POA: Diagnosis not present

## 2023-07-15 DIAGNOSIS — D689 Coagulation defect, unspecified: Secondary | ICD-10-CM | POA: Diagnosis not present

## 2023-07-15 DIAGNOSIS — Z992 Dependence on renal dialysis: Secondary | ICD-10-CM | POA: Diagnosis not present

## 2023-07-15 DIAGNOSIS — N2581 Secondary hyperparathyroidism of renal origin: Secondary | ICD-10-CM | POA: Diagnosis not present

## 2023-07-15 DIAGNOSIS — N186 End stage renal disease: Secondary | ICD-10-CM | POA: Diagnosis not present

## 2023-07-15 DIAGNOSIS — D631 Anemia in chronic kidney disease: Secondary | ICD-10-CM | POA: Diagnosis not present

## 2023-07-15 DIAGNOSIS — T8249XA Other complication of vascular dialysis catheter, initial encounter: Secondary | ICD-10-CM | POA: Diagnosis not present

## 2023-07-15 DIAGNOSIS — R52 Pain, unspecified: Secondary | ICD-10-CM | POA: Diagnosis not present

## 2023-07-17 DIAGNOSIS — N2581 Secondary hyperparathyroidism of renal origin: Secondary | ICD-10-CM | POA: Diagnosis not present

## 2023-07-17 DIAGNOSIS — D631 Anemia in chronic kidney disease: Secondary | ICD-10-CM | POA: Diagnosis not present

## 2023-07-17 DIAGNOSIS — R52 Pain, unspecified: Secondary | ICD-10-CM | POA: Diagnosis not present

## 2023-07-17 DIAGNOSIS — N186 End stage renal disease: Secondary | ICD-10-CM | POA: Diagnosis not present

## 2023-07-17 DIAGNOSIS — T8249XA Other complication of vascular dialysis catheter, initial encounter: Secondary | ICD-10-CM | POA: Diagnosis not present

## 2023-07-17 DIAGNOSIS — Z992 Dependence on renal dialysis: Secondary | ICD-10-CM | POA: Diagnosis not present

## 2023-07-17 DIAGNOSIS — D689 Coagulation defect, unspecified: Secondary | ICD-10-CM | POA: Diagnosis not present

## 2023-07-19 ENCOUNTER — Ambulatory Visit (INDEPENDENT_AMBULATORY_CARE_PROVIDER_SITE_OTHER): Payer: 59 | Admitting: Family Medicine

## 2023-07-19 ENCOUNTER — Other Ambulatory Visit: Payer: Self-pay | Admitting: Family Medicine

## 2023-07-19 VITALS — BP 112/80 | HR 109 | Ht 66.0 in | Wt 137.0 lb

## 2023-07-19 DIAGNOSIS — Z933 Colostomy status: Secondary | ICD-10-CM

## 2023-07-19 DIAGNOSIS — K5090 Crohn's disease, unspecified, without complications: Secondary | ICD-10-CM | POA: Diagnosis not present

## 2023-07-19 DIAGNOSIS — F419 Anxiety disorder, unspecified: Secondary | ICD-10-CM | POA: Diagnosis not present

## 2023-07-19 DIAGNOSIS — I5020 Unspecified systolic (congestive) heart failure: Secondary | ICD-10-CM

## 2023-07-19 MED ORDER — LORAZEPAM 0.5 MG PO TABS: 0.5000 mg | ORAL_TABLET | Freq: Two times a day (BID) | ORAL | 0 refills | Status: DC | PRN

## 2023-07-19 MED ORDER — PREGABALIN 25 MG PO CAPS: 25.0000 mg | ORAL_CAPSULE | Freq: Two times a day (BID) | ORAL | 1 refills | Status: DC

## 2023-07-19 NOTE — Patient Instructions (Addendum)
 [name] Address: [address] [name], [place] [address] Phone: [phone]  Call GI for appointment for your Crohn's Disease.   Come back in 3 months.

## 2023-07-19 NOTE — Assessment & Plan Note (Signed)
 GI referral for reestablishment placed.

## 2023-07-19 NOTE — Progress Notes (Signed)
   SUBJECTIVE:   CHIEF COMPLAINT / HPI:   Crohn's - referred to GI at last visit. Hasn't heard about appointment. Prefers Dr. Dyane with [facility], has seen before but has been many years.   Med rec - seen previously 12/2 for CAD, recovered HFrEF, HLD but unsure of what medications she was on, specifically unsure if she was taking imdur , coreg . Hasn't been refilled since 2022 per chart review. Not in medication bag today. Previously followed by Cardiology, last seen 09/2019. ECHO 2023 with recovered function.   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 Not on imdur  or coreg  in >2 years, med list updated. Likely not needed at this time due to recovered function, asymptomatic, continue to monitor.   Crohn disease (HCC) GI referral for reestablishment placed.    HM - due for PCV20, no vaccines available in clinic today due to weather. Encouraged to receive at pharmacy or at next visit.   F/u 3 months.   [name] CHRISTELLA Lai, DO

## 2023-07-19 NOTE — Assessment & Plan Note (Addendum)
 Last ECHO 2023 with recovered LVEF, thought 2/2 ?viral myocarditis. Not on imdur or coreg in >2 years, med list updated. Likely not needed at this time due to recovered function, asymptomatic, continue to monitor.

## 2023-07-20 ENCOUNTER — Other Ambulatory Visit: Payer: Self-pay

## 2023-07-20 DIAGNOSIS — R52 Pain, unspecified: Secondary | ICD-10-CM | POA: Diagnosis not present

## 2023-07-20 DIAGNOSIS — D689 Coagulation defect, unspecified: Secondary | ICD-10-CM | POA: Diagnosis not present

## 2023-07-20 DIAGNOSIS — T8249XA Other complication of vascular dialysis catheter, initial encounter: Secondary | ICD-10-CM | POA: Diagnosis not present

## 2023-07-20 DIAGNOSIS — Z992 Dependence on renal dialysis: Secondary | ICD-10-CM | POA: Diagnosis not present

## 2023-07-20 DIAGNOSIS — D631 Anemia in chronic kidney disease: Secondary | ICD-10-CM | POA: Diagnosis not present

## 2023-07-20 DIAGNOSIS — N2581 Secondary hyperparathyroidism of renal origin: Secondary | ICD-10-CM | POA: Diagnosis not present

## 2023-07-20 DIAGNOSIS — N186 End stage renal disease: Secondary | ICD-10-CM

## 2023-07-22 DIAGNOSIS — D631 Anemia in chronic kidney disease: Secondary | ICD-10-CM | POA: Diagnosis not present

## 2023-07-22 DIAGNOSIS — N2581 Secondary hyperparathyroidism of renal origin: Secondary | ICD-10-CM | POA: Diagnosis not present

## 2023-07-22 DIAGNOSIS — N186 End stage renal disease: Secondary | ICD-10-CM | POA: Diagnosis not present

## 2023-07-22 DIAGNOSIS — T8249XA Other complication of vascular dialysis catheter, initial encounter: Secondary | ICD-10-CM | POA: Diagnosis not present

## 2023-07-22 DIAGNOSIS — D689 Coagulation defect, unspecified: Secondary | ICD-10-CM | POA: Diagnosis not present

## 2023-07-22 DIAGNOSIS — R52 Pain, unspecified: Secondary | ICD-10-CM | POA: Diagnosis not present

## 2023-07-22 DIAGNOSIS — Z992 Dependence on renal dialysis: Secondary | ICD-10-CM | POA: Diagnosis not present

## 2023-07-24 DIAGNOSIS — R52 Pain, unspecified: Secondary | ICD-10-CM | POA: Diagnosis not present

## 2023-07-24 DIAGNOSIS — N186 End stage renal disease: Secondary | ICD-10-CM | POA: Diagnosis not present

## 2023-07-24 DIAGNOSIS — D689 Coagulation defect, unspecified: Secondary | ICD-10-CM | POA: Diagnosis not present

## 2023-07-24 DIAGNOSIS — T8249XA Other complication of vascular dialysis catheter, initial encounter: Secondary | ICD-10-CM | POA: Diagnosis not present

## 2023-07-24 DIAGNOSIS — D631 Anemia in chronic kidney disease: Secondary | ICD-10-CM | POA: Diagnosis not present

## 2023-07-24 DIAGNOSIS — N2581 Secondary hyperparathyroidism of renal origin: Secondary | ICD-10-CM | POA: Diagnosis not present

## 2023-07-24 DIAGNOSIS — Z992 Dependence on renal dialysis: Secondary | ICD-10-CM | POA: Diagnosis not present

## 2023-07-27 ENCOUNTER — Encounter (HOSPITAL_COMMUNITY): Payer: Self-pay | Admitting: Vascular Surgery

## 2023-07-27 ENCOUNTER — Other Ambulatory Visit: Payer: Self-pay

## 2023-07-27 DIAGNOSIS — N186 End stage renal disease: Secondary | ICD-10-CM | POA: Diagnosis not present

## 2023-07-27 DIAGNOSIS — D631 Anemia in chronic kidney disease: Secondary | ICD-10-CM | POA: Diagnosis not present

## 2023-07-27 DIAGNOSIS — N2581 Secondary hyperparathyroidism of renal origin: Secondary | ICD-10-CM | POA: Diagnosis not present

## 2023-07-27 DIAGNOSIS — R52 Pain, unspecified: Secondary | ICD-10-CM | POA: Diagnosis not present

## 2023-07-27 DIAGNOSIS — Z992 Dependence on renal dialysis: Secondary | ICD-10-CM | POA: Diagnosis not present

## 2023-07-27 DIAGNOSIS — T8249XA Other complication of vascular dialysis catheter, initial encounter: Secondary | ICD-10-CM | POA: Diagnosis not present

## 2023-07-27 DIAGNOSIS — D689 Coagulation defect, unspecified: Secondary | ICD-10-CM | POA: Diagnosis not present

## 2023-07-27 NOTE — Pre-Procedure Instructions (Signed)
-------------    SDW INSTRUCTIONS given:  Your procedure is scheduled on 1/22.  Report to [facility] Main Entrance "A" at 07:00 A.M., and check in at the Admitting office.  Any questions or running late day of surgery: call [phone]    Remember:  Do not eat or drink after midnight the night before your surgery      Take these medicines the morning of surgery with A SIP OF WATER  Lipitor Celexa lyrica    May take these medicines IF NEEDED: Ativan Clear eyes eye drops  As of today, STOP taking any Aspirin (unless otherwise instructed by your surgeon) Aleve, Naproxen, Ibuprofen, Motrin, Advil, Goody's, BC's, all herbal medications, fish oil, and all vitamins.   Do NOT Smoke (Tobacco/Vaping) 24 hours prior to your procedure  If you use a CPAP at night, you may bring all equipment for your overnight stay.     You will be asked to remove any contacts, glasses, piercing's, hearing aid's, dentures/partials prior to surgery. Please bring cases for these items if needed.     Patients discharged the day of surgery will not be allowed to drive home, and someone needs to stay with them for 24 hours.  SURGICAL WAITING ROOM VISITATION Patients may have no more than 2 support people in the waiting area - these visitors may rotate.   Pre-op nurse will coordinate an appropriate time for 1 ADULT support person, who may not rotate, to accompany patient in pre-op.  Children under the age of 40 must have an adult with them who is not the patient and must remain in the main waiting area with an adult.  If the patient needs to stay at the hospital during part of their recovery, the visitor guidelines for inpatient rooms apply.  Please refer to the [facility] website for the visitor guidelines for any additional information.   Special instructions:   [facility]- Preparing For Surgery   Please follow these instructions carefully.   Shower the NIGHT BEFORE SURGERY and the MORNING OF  SURGERY with DIAL Soap.   Pat yourself dry with a CLEAN TOWEL.  Wear CLEAN PAJAMAS to bed the night before surgery  Place CLEAN SHEETS on your bed the night of your first shower and DO NOT SLEEP WITH PETS.   Additional instructions for the day of surgery: DO NOT APPLY any lotions, deodorants, cologne, or perfumes.   Do not wear jewelry or makeup Do not wear nail polish, gel polish, artificial nails, or any other type of covering on natural nails (fingers and toes) Do not bring valuables to the hospital. [facility] is not responsible for valuables/personal belongings. Put on clean/comfortable clothes.  Please brush your teeth.  Ask your nurse before applying any prescription medications to the skin.

## 2023-07-27 NOTE — Anesthesia Preprocedure Evaluation (Signed)
Anesthesia Evaluation  Patient identified by MRN, date of birth, ID band Patient awake    Reviewed: Allergy & Precautions, NPO status , Patient's Chart, lab work & pertinent test results  History of Anesthesia Complications Negative for: history of anesthetic complications  Airway Mallampati: III  TM Distance: >3 FB     Dental  (+) Edentulous Upper, Partial Lower, Dental Advisory Given   Pulmonary asthma , Current Smoker and Patient abstained from smoking.   breath sounds clear to auscultation       Cardiovascular hypertension, + CAD, + Peripheral Vascular Disease and +CHF   Rhythm:Regular   1. Compared with the echo in 2021, systolic function has normalized.   2. Left ventricular ejection fraction, by estimation, is 60 to 65%. The  left ventricle has normal function. The left ventricle has no regional  wall motion abnormalities. Left ventricular diastolic parameters are  consistent with Grade I diastolic  dysfunction (impaired relaxation).   3. Right ventricular systolic function is normal. The right ventricular  size is normal. There is normal pulmonary artery systolic pressure.   4. Compared with the echo 08/2019, anterior mitral valve leaflet is more  thickened and appears to be rheumatic. The mitral valve is rheumatic. Mild  mitral valve regurgitation. No evidence of mitral stenosis. There is mild  holosystolic prolapse of  anterior of the mitral valve.   5. The aortic valve is tricuspid. Aortic valve regurgitation is mild. No  aortic stenosis is present. Aortic regurgitation PHT measures 567 msec.   6. The inferior vena cava is normal in size with greater than 50%  respiratory variability, suggesting right atrial pressure of 3 mmHg.     EKG not interpretable due to resting ST depressions  The left ventricular ejection fraction is moderately decreased (30-44%).  Nuclear stress EF: 35%.  Defect 1: There is a medium  defect of mild severity present in the basal anteroseptal, mid anteroseptal, apical anterior and apex location.  Defect 2: There is a small defect of mild severity present in the basal inferior, mid inferior and apical inferior location.  Findings consistent with ischemia and prior myocardial infarction.  This is an intermediate risk study due to moderately decreased systolic function. Small amount of ischemia   1. Fixed defect consistent with infarct in apex, apical anterior/inferior, and mid to basal anteroseptal walls 2. Reversible defect consistent with ischemia in basal to mid inferior wall consistent with ischemia.     Neuro/Psych Seizures -, Well Controlled,  PSYCHIATRIC DISORDERS Anxiety      Neuromuscular disease    GI/Hepatic Neg liver ROS,GERD  ,,  Endo/Other  negative endocrine ROS    Renal/GU ESRF and DialysisRenal diseaseLab Results      Component                Value               Date                      NA                       140                 07/28/2023                K                        4.3  07/28/2023                CO2                      21 (L)              02/28/2022                GLUCOSE                  97                  07/28/2023                BUN                      39 (H)              07/28/2023                CREATININE               8.20 (H)            07/28/2023                CALCIUM                  8.5 (L)             02/28/2022                GFR                      30.69 (L)           09/28/2013                GFRNONAA                 4 (L)               02/28/2022                Musculoskeletal  (+) Arthritis ,    Abdominal   Peds  Hematology  (+) Blood dyscrasia, anemia Lab Results      Component                Value               Date                      WBC                      6.0                 02/28/2022                HGB                      10.9 (L)            07/28/2023                HCT                       32.0 (L)            07/28/2023  MCV                      106.3 (H)           02/28/2022                PLT                      166                 02/28/2022              Anesthesia Other Findings   Reproductive/Obstetrics                              Anesthesia Physical Anesthesia Plan  ASA: 3  Anesthesia Plan: General   Post-op Pain Management: Minimal or no pain anticipated   Induction: Intravenous  PONV Risk Score and Plan: 2 and Ondansetron and Dexamethasone  Airway Management Planned: Oral ETT  Additional Equipment: None  Intra-op Plan:   Post-operative Plan: Extubation in OR  Informed Consent: I have reviewed the patients History and Physical, chart, labs and discussed the procedure including the risks, benefits and alternatives for the proposed anesthesia with the patient or authorized representative who has indicated his/her understanding and acceptance.     Dental advisory given  Plan Discussed with: CRNA  Anesthesia Plan Comments: (PAT note by Antionette Poles, PA-C: 73 year old female pertinent history of HTN, ESRD on HD, HLD, Crohn's disease, seizure (1 episode in 2012, felt related to gabapentin), GERD, systolic heart failure with recovered EF, hypotension on midodrine.  She had nuclear stress test in 09/2019 was read as abnormal, however, she was subsequently seen by cardiologist Dr. Flora Lipps on 04/10/2020 and he stated the test was, "normal on my review".  Most recent echo 10/05/2021 showed EF 60 to 65%, grade 1 DD, mild mitral regurgitation, mild aortic regurgitation.  Patient no longer following with cardiology; chronic conditions followed by PCP Dr. Linwood Dibbles.  Last seen 07/19/2023.  Per note, "Last ECHO 2023 with recovered LVEF, thought 2/2 ?viral myocarditis. Not on imdur or coreg in >2 years, med list updated. Likely not needed at this time due to recovered function, asymptomatic, continue to monitor."  ESRD  on HD Tuesday Thursday Saturday.  She is currently using a right-sided TDC.  She has a prior right upper arm AV graft that has occluded.   EKG 03/02/2022: Normal sinus rhythm.  Rate 72. Nonspecific ST abnormality  TTE 09/26/2021:  1. Compared with the echo in 2021, systolic function has normalized.   2. Left ventricular ejection fraction, by estimation, is 60 to 65%. The  left ventricle has normal function. The left ventricle has no regional  wall motion abnormalities. Left ventricular diastolic parameters are  consistent with Grade I diastolic  dysfunction (impaired relaxation).   3. Right ventricular systolic function is normal. The right ventricular  size is normal. There is normal pulmonary artery systolic pressure.   4. Compared with the echo 08/2019, anterior mitral valve leaflet is more  thickened and appears to be rheumatic. The mitral valve is rheumatic. Mild  mitral valve regurgitation. No evidence of mitral stenosis. There is mild  holosystolic prolapse of  anterior of the mitral valve.   5. The aortic valve is tricuspid. Aortic valve regurgitation is mild. No  aortic stenosis is present. Aortic regurgitation PHT measures 567 msec.   6. The inferior vena  cava is normal in size with greater than 50%  respiratory variability, suggesting right atrial pressure of 3 mmHg.    )         Anesthesia Quick Evaluation

## 2023-07-27 NOTE — Progress Notes (Signed)
PCP - Dr. Ellwood Dense Cardiologist - Dr. Lennie Odor  PPM/ICD - denies   Chest x-ray - 04/27/22- CE EKG - DOS Stress Test - 09/26/19 ECHO - 09/26/21 Cardiac Cath - denies  CPAP - denies  DM- denies  ASA/Blood Thinner Instructions:  n/a   ERAS Protcol - no, NPO  COVID TEST- n/a  Anesthesia review: yes, cardiac hx   Patient verbally denies any shortness of breath, fever, cough and chest pain during phone call      Questions were answered. Patient verbalized understanding of instructions.

## 2023-07-27 NOTE — Progress Notes (Addendum)
Anesthesia Chart Review: Same-day workup  73 year old female pertinent history of HTN, ESRD on HD, HLD, Crohn's disease, seizure (1 episode in 2012, felt related to gabapentin), GERD, systolic heart failure with recovered EF, hypotension on midodrine.  She had nuclear stress test in 09/2019 was read as abnormal, however, she was subsequently seen by cardiologist Dr. Flora Lipps on 04/10/2020 and he stated the test was, "normal on my review".  Most recent echo 10/05/2021 showed EF 60 to 65%, grade 1 DD, mild mitral regurgitation, mild aortic regurgitation.  Patient no longer following with cardiology; chronic conditions followed by PCP Dr. Linwood Dibbles.  Last seen 07/19/2023.  Per note, "Last ECHO 2023 with recovered LVEF, thought 2/2 ?viral myocarditis. Not on imdur or coreg in >2 years, med list updated. Likely not needed at this time due to recovered function, asymptomatic, continue to monitor."  ESRD on HD Tuesday Thursday Saturday.  She is currently using a right-sided TDC.  She has a prior right upper arm AV graft that has occluded.  Pt will need DOS labs and eval.   EKG 03/02/2022: Normal sinus rhythm.  Rate 72. Nonspecific ST abnormality  TTE 09/26/2021:  1. Compared with the echo in 2021, systolic function has normalized.   2. Left ventricular ejection fraction, by estimation, is 60 to 65%. The  left ventricle has normal function. The left ventricle has no regional  wall motion abnormalities. Left ventricular diastolic parameters are  consistent with Grade I diastolic  dysfunction (impaired relaxation).   3. Right ventricular systolic function is normal. The right ventricular  size is normal. There is normal pulmonary artery systolic pressure.   4. Compared with the echo 08/2019, anterior mitral valve leaflet is more  thickened and appears to be rheumatic. The mitral valve is rheumatic. Mild  mitral valve regurgitation. No evidence of mitral stenosis. There is mild  holosystolic prolapse of  anterior  of the mitral valve.   5. The aortic valve is tricuspid. Aortic valve regurgitation is mild. No  aortic stenosis is present. Aortic regurgitation PHT measures 567 msec.   6. The inferior vena cava is normal in size with greater than 50%  respiratory variability, suggesting right atrial pressure of 3 mmHg.     Zannie Cove [facility] Short Stay Center/Anesthesiology Phone [phone] 07/27/2023 12:24 PM

## 2023-07-28 ENCOUNTER — Other Ambulatory Visit: Payer: Self-pay

## 2023-07-28 ENCOUNTER — Ambulatory Visit (HOSPITAL_BASED_OUTPATIENT_CLINIC_OR_DEPARTMENT_OTHER): Payer: 59 | Admitting: Physician Assistant

## 2023-07-28 ENCOUNTER — Other Ambulatory Visit (HOSPITAL_COMMUNITY): Payer: Self-pay

## 2023-07-28 ENCOUNTER — Ambulatory Visit (HOSPITAL_COMMUNITY)
Admission: RE | Admit: 2023-07-28 | Discharge: 2023-07-28 | Disposition: A | Discharge status: Home / Self Care | Payer: 59 | Attending: Vascular Surgery | Admitting: Vascular Surgery

## 2023-07-28 ENCOUNTER — Encounter (HOSPITAL_COMMUNITY)
Admission: RE | Disposition: A | Discharge status: Home / Self Care | Payer: Self-pay | Source: Home / Self Care | Attending: Vascular Surgery

## 2023-07-28 ENCOUNTER — Encounter (HOSPITAL_COMMUNITY): Payer: Self-pay | Admitting: Vascular Surgery

## 2023-07-28 ENCOUNTER — Ambulatory Visit (HOSPITAL_COMMUNITY): Payer: 59 | Admitting: Physician Assistant

## 2023-07-28 DIAGNOSIS — K219 Gastro-esophageal reflux disease without esophagitis: Secondary | ICD-10-CM | POA: Insufficient documentation

## 2023-07-28 DIAGNOSIS — I5020 Unspecified systolic (congestive) heart failure: Secondary | ICD-10-CM

## 2023-07-28 DIAGNOSIS — D631 Anemia in chronic kidney disease: Secondary | ICD-10-CM | POA: Diagnosis not present

## 2023-07-28 DIAGNOSIS — F17210 Nicotine dependence, cigarettes, uncomplicated: Secondary | ICD-10-CM

## 2023-07-28 DIAGNOSIS — K5090 Crohn's disease, unspecified, without complications: Secondary | ICD-10-CM | POA: Diagnosis not present

## 2023-07-28 DIAGNOSIS — I132 Hypertensive heart and chronic kidney disease with heart failure and with stage 5 chronic kidney disease, or end stage renal disease: Secondary | ICD-10-CM

## 2023-07-28 DIAGNOSIS — J45909 Unspecified asthma, uncomplicated: Secondary | ICD-10-CM | POA: Insufficient documentation

## 2023-07-28 DIAGNOSIS — E785 Hyperlipidemia, unspecified: Secondary | ICD-10-CM | POA: Diagnosis not present

## 2023-07-28 DIAGNOSIS — Z79899 Other long term (current) drug therapy: Secondary | ICD-10-CM | POA: Insufficient documentation

## 2023-07-28 DIAGNOSIS — N186 End stage renal disease: Secondary | ICD-10-CM | POA: Diagnosis not present

## 2023-07-28 DIAGNOSIS — I2510 Atherosclerotic heart disease of native coronary artery without angina pectoris: Secondary | ICD-10-CM | POA: Diagnosis not present

## 2023-07-28 DIAGNOSIS — Z992 Dependence on renal dialysis: Secondary | ICD-10-CM | POA: Insufficient documentation

## 2023-07-28 DIAGNOSIS — N185 Chronic kidney disease, stage 5: Secondary | ICD-10-CM | POA: Diagnosis not present

## 2023-07-28 HISTORY — DX: Unspecified osteoarthritis, unspecified site: M19.90

## 2023-07-28 HISTORY — PX: AV FISTULA PLACEMENT: SHX1204

## 2023-07-28 LAB — POCT I-STAT, CHEM 8
BUN: 39 mg/dL — ABNORMAL HIGH (ref 8–23)
Calcium, Ion: 1.16 mmol/L (ref 1.15–1.40)
Chloride: 106 mmol/L (ref 98–111)
Creatinine, Ser: 8.2 mg/dL — ABNORMAL HIGH (ref 0.44–1.00)
Glucose, Bld: 97 mg/dL (ref 70–99)
HCT: 32 % — ABNORMAL LOW (ref 36.0–46.0)
Hemoglobin: 10.9 g/dL — ABNORMAL LOW (ref 12.0–15.0)
Potassium: 4.3 mmol/L (ref 3.5–5.1)
Sodium: 140 mmol/L (ref 135–145)
TCO2: 22 mmol/L (ref 22–32)

## 2023-07-28 SURGERY — ARTERIOVENOUS (AV) FISTULA CREATION
Anesthesia: General | Laterality: Left

## 2023-07-28 MED ORDER — HEPARIN SODIUM (PORCINE) 1000 UNIT/ML IJ SOLN
INTRAMUSCULAR | Status: DC | PRN
  Administered 2023-07-28: 3000 [IU] via INTRAVENOUS

## 2023-07-28 MED ORDER — ACETAMINOPHEN 500 MG PO TABS: 1000.0000 mg | ORAL_TABLET | Freq: Once | ORAL | Status: DC | PRN

## 2023-07-28 MED ORDER — OXYCODONE HCL 5 MG PO TABS
ORAL_TABLET | ORAL | Status: AC
  Administered 2023-07-28: 5 mg via ORAL
  Filled 2023-07-28: qty 1

## 2023-07-28 MED ORDER — CHLORHEXIDINE GLUCONATE 4 % EX SOLN: 60.0000 mL | Freq: Once | CUTANEOUS | Status: DC

## 2023-07-28 MED ORDER — OXYCODONE HCL 5 MG PO TABS: 5.0000 mg | ORAL_TABLET | Freq: Once | ORAL | Status: AC | PRN

## 2023-07-28 MED ORDER — SUGAMMADEX SODIUM 200 MG/2ML IV SOLN
INTRAVENOUS | Status: DC | PRN
  Administered 2023-07-28: 118 mg via INTRAVENOUS

## 2023-07-28 MED ORDER — VANCOMYCIN HCL IN DEXTROSE 1-5 GM/200ML-% IV SOLN: 1000.0000 mg | INTRAVENOUS | Status: DC

## 2023-07-28 MED ORDER — ROCURONIUM BROMIDE 10 MG/ML (PF) SYRINGE
PREFILLED_SYRINGE | INTRAVENOUS | Status: DC | PRN
  Administered 2023-07-28: 40 mg via INTRAVENOUS

## 2023-07-28 MED ORDER — ORAL CARE MOUTH RINSE: 15.0000 mL | Freq: Once | OROMUCOSAL | Status: AC

## 2023-07-28 MED ORDER — HEPARIN 6000 UNIT IRRIGATION SOLUTION
Status: DC | PRN
  Administered 2023-07-28: 1

## 2023-07-28 MED ORDER — LIDOCAINE-EPINEPHRINE (PF) 1 %-1:200000 IJ SOLN
INTRAMUSCULAR | Status: AC
  Filled 2023-07-28: qty 30

## 2023-07-28 MED ORDER — EPHEDRINE 5 MG/ML INJ
INTRAVENOUS | Status: AC
  Filled 2023-07-28: qty 5

## 2023-07-28 MED ORDER — ACETAMINOPHEN 10 MG/ML IV SOLN
1000.0000 mg | Freq: Once | INTRAVENOUS | Status: DC | PRN
  Administered 2023-07-28: 1000 mg via INTRAVENOUS

## 2023-07-28 MED ORDER — SODIUM CHLORIDE 0.9% FLUSH: 3.0000 mL | INTRAVENOUS | Status: DC | PRN

## 2023-07-28 MED ORDER — HEMOSTATIC AGENTS (NO CHARGE) OPTIME
TOPICAL | Status: DC | PRN
  Administered 2023-07-28: 1 via TOPICAL

## 2023-07-28 MED ORDER — DEXAMETHASONE SODIUM PHOSPHATE 10 MG/ML IJ SOLN
INTRAMUSCULAR | Status: DC | PRN
  Administered 2023-07-28: 8 mg via INTRAVENOUS

## 2023-07-28 MED ORDER — SODIUM CHLORIDE 0.9% FLUSH: 3.0000 mL | Freq: Two times a day (BID) | INTRAVENOUS | Status: DC

## 2023-07-28 MED ORDER — LIDOCAINE 2% (20 MG/ML) 5 ML SYRINGE
INTRAMUSCULAR | Status: AC
  Filled 2023-07-28: qty 5

## 2023-07-28 MED ORDER — EPHEDRINE SULFATE-NACL 50-0.9 MG/10ML-% IV SOSY
PREFILLED_SYRINGE | INTRAVENOUS | Status: DC | PRN
  Administered 2023-07-28: 5 mg via INTRAVENOUS

## 2023-07-28 MED ORDER — VASOPRESSIN 20 UNIT/ML IV SOLN
INTRAVENOUS | Status: AC
  Filled 2023-07-28: qty 1

## 2023-07-28 MED ORDER — ACETAMINOPHEN 10 MG/ML IV SOLN
INTRAVENOUS | Status: AC
  Filled 2023-07-28: qty 100

## 2023-07-28 MED ORDER — OXYCODONE HCL 5 MG/5ML PO SOLN: 5.0000 mg | Freq: Once | ORAL | Status: AC | PRN

## 2023-07-28 MED ORDER — ONDANSETRON HCL 4 MG/2ML IJ SOLN
INTRAMUSCULAR | Status: DC | PRN
  Administered 2023-07-28: 4 mg via INTRAVENOUS

## 2023-07-28 MED ORDER — ONDANSETRON HCL 4 MG/2ML IJ SOLN
INTRAMUSCULAR | Status: AC
  Filled 2023-07-28: qty 2

## 2023-07-28 MED ORDER — PROPOFOL 10 MG/ML IV BOLUS
INTRAVENOUS | Status: AC
  Filled 2023-07-28: qty 20

## 2023-07-28 MED ORDER — FENTANYL CITRATE (PF) 250 MCG/5ML IJ SOLN
INTRAMUSCULAR | Status: AC
  Filled 2023-07-28: qty 5

## 2023-07-28 MED ORDER — ROCURONIUM BROMIDE 10 MG/ML (PF) SYRINGE
PREFILLED_SYRINGE | INTRAVENOUS | Status: AC
  Filled 2023-07-28: qty 10

## 2023-07-28 MED ORDER — ACETAMINOPHEN 160 MG/5ML PO SOLN: 1000.0000 mg | Freq: Once | ORAL | Status: DC | PRN

## 2023-07-28 MED ORDER — LIDOCAINE 2% (20 MG/ML) 5 ML SYRINGE
INTRAMUSCULAR | Status: DC | PRN
  Administered 2023-07-28: 40 mg via INTRAVENOUS

## 2023-07-28 MED ORDER — PAPAVERINE HCL 30 MG/ML IJ SOLN
INTRAMUSCULAR | Status: AC
  Filled 2023-07-28: qty 2

## 2023-07-28 MED ORDER — FENTANYL CITRATE (PF) 250 MCG/5ML IJ SOLN
INTRAMUSCULAR | Status: DC | PRN
  Administered 2023-07-28 (×3): 50 ug via INTRAVENOUS

## 2023-07-28 MED ORDER — HYDROCODONE-ACETAMINOPHEN 5-325 MG PO TABS
1.0000 | ORAL_TABLET | Freq: Four times a day (QID) | ORAL | 0 refills | Status: DC | PRN
  Filled 2023-07-28: qty 15, 7d supply, fill #0

## 2023-07-28 MED ORDER — CHLORHEXIDINE GLUCONATE 0.12 % MT SOLN
15.0000 mL | Freq: Once | OROMUCOSAL | Status: AC
  Administered 2023-07-28: 15 mL via OROMUCOSAL
  Filled 2023-07-28: qty 15

## 2023-07-28 MED ORDER — PHENYLEPHRINE HCL-NACL 20-0.9 MG/250ML-% IV SOLN
INTRAVENOUS | Status: DC | PRN
  Administered 2023-07-28: 25 ug/min via INTRAVENOUS

## 2023-07-28 MED ORDER — PROPOFOL 10 MG/ML IV BOLUS
INTRAVENOUS | Status: DC | PRN
  Administered 2023-07-28: 60 mg via INTRAVENOUS
  Administered 2023-07-28: 30 mg via INTRAVENOUS

## 2023-07-28 MED ORDER — FENTANYL CITRATE (PF) 100 MCG/2ML IJ SOLN: 25.0000 ug | INTRAMUSCULAR | Status: DC | PRN

## 2023-07-28 MED ORDER — 0.9 % SODIUM CHLORIDE (POUR BTL) OPTIME
TOPICAL | Status: DC | PRN
  Administered 2023-07-28: 1000 mL

## 2023-07-28 MED ORDER — CEFAZOLIN SODIUM-DEXTROSE 2-4 GM/100ML-% IV SOLN
2.0000 g | Freq: Once | INTRAVENOUS | Status: AC
  Administered 2023-07-28: 2 g via INTRAVENOUS
  Filled 2023-07-28: qty 100

## 2023-07-28 MED ORDER — HEPARIN 6000 UNIT IRRIGATION SOLUTION
Status: AC
  Filled 2023-07-28: qty 500

## 2023-07-28 MED ORDER — PHENYLEPHRINE 80 MCG/ML (10ML) SYRINGE FOR IV PUSH (FOR BLOOD PRESSURE SUPPORT)
PREFILLED_SYRINGE | INTRAVENOUS | Status: DC | PRN
  Administered 2023-07-28: 80 ug via INTRAVENOUS

## 2023-07-28 MED ORDER — SODIUM CHLORIDE 0.9 % IV SOLN: INTRAVENOUS | Status: DC | PRN

## 2023-07-28 MED ORDER — PHENYLEPHRINE 80 MCG/ML (10ML) SYRINGE FOR IV PUSH (FOR BLOOD PRESSURE SUPPORT)
PREFILLED_SYRINGE | INTRAVENOUS | Status: AC
  Filled 2023-07-28: qty 10

## 2023-07-28 SURGICAL SUPPLY — 31 items
ARMBAND PINK RESTRICT EXTREMIT (MISCELLANEOUS) ×2 IMPLANT
BAG COUNTER SPONGE SURGICOUNT (BAG) ×2 IMPLANT
CANISTER SUCT 3000ML PPV (MISCELLANEOUS) ×2 IMPLANT
CLIP TI MEDIUM 6 (CLIP) ×2 IMPLANT
CLIP TI WIDE RED SMALL 6 (CLIP) ×2 IMPLANT
COVER PROBE W GEL 5X96 (DRAPES) IMPLANT
DERMABOND ADVANCED .7 DNX12 (GAUZE/BANDAGES/DRESSINGS) ×2 IMPLANT
ELECT REM PT RETURN 9FT ADLT (ELECTROSURGICAL) ×1 IMPLANT
ELECTRODE REM PT RTRN 9FT ADLT (ELECTROSURGICAL) ×2 IMPLANT
GLOVE BIOGEL PI IND STRL 7.0 (GLOVE) ×2 IMPLANT
GLOVE BIOGEL PI IND STRL 7.5 (GLOVE) IMPLANT
GLOVE SURG SS PI 6.5 STRL IVOR (GLOVE) IMPLANT
GOWN STRL REUS W/ TWL LRG LVL3 (GOWN DISPOSABLE) ×4 IMPLANT
GOWN STRL REUS W/ TWL XL LVL3 (GOWN DISPOSABLE) ×2 IMPLANT
GRAFT GORETEX STRT 4-7X45 (Vascular Products) IMPLANT
HEMOSTAT SNOW SURGICEL 2X4 (HEMOSTASIS) IMPLANT
INSERT FOGARTY SM (MISCELLANEOUS) IMPLANT
KIT BASIN OR (CUSTOM PROCEDURE TRAY) ×2 IMPLANT
KIT TURNOVER KIT B (KITS) ×2 IMPLANT
NS IRRIG 1000ML POUR BTL (IV SOLUTION) ×2 IMPLANT
PACK CV ACCESS (CUSTOM PROCEDURE TRAY) ×2 IMPLANT
PAD ARMBOARD 7.5X6 YLW CONV (MISCELLANEOUS) ×4 IMPLANT
POWDER SURGICEL 3.0 GRAM (HEMOSTASIS) IMPLANT
SLING ARM FOAM STRAP LRG (SOFTGOODS) IMPLANT
SLING ARM FOAM STRAP MED (SOFTGOODS) IMPLANT
SUT MNCRL AB 4-0 PS2 18 (SUTURE) ×2 IMPLANT
SUT PROLENE 6 0 BV (SUTURE) ×2 IMPLANT
SUT VIC AB 3-0 SH 27X BRD (SUTURE) ×2 IMPLANT
TOWEL GREEN STERILE (TOWEL DISPOSABLE) ×2 IMPLANT
UNDERPAD 30X36 HEAVY ABSORB (UNDERPADS AND DIAPERS) ×2 IMPLANT
WATER STERILE IRR 1000ML POUR (IV SOLUTION) ×2 IMPLANT

## 2023-07-28 NOTE — Discharge Instructions (Signed)

## 2023-07-28 NOTE — Progress Notes (Signed)
The patient uses [facility] [phone] for medical transport. Per the patient her daughter does not drive but will be present at home to stay with her after surgery,.

## 2023-07-28 NOTE — Transfer of Care (Signed)
Immediate Anesthesia Transfer of Care Note  Patient: [name] [name]  Procedure(s) Performed: LEFT ARM ARTERIOVENOUS (AV) GRAFT USING CORE-TEX STRETCH VASCULAR (Left)  Patient Location: PACU  Anesthesia Type:General  Level of Consciousness: awake, alert , and oriented  Airway & Oxygen Therapy: Patient Spontanous Breathing and Patient connected to nasal cannula oxygen  Post-op Assessment: Report given to RN and Post -op Vital signs reviewed and stable  Post vital signs: Reviewed and stable  Last Vitals:  Vitals Value Taken Time  BP 102/50 07/28/23 1033  Temp 36.7 C 07/28/23 1033  Pulse 96 07/28/23 1042  Resp 18 07/28/23 1042  SpO2 93 % 07/28/23 1042  Vitals shown include unfiled device data.  Last Pain:  Vitals:   07/28/23 0724  PainSc: 0-No pain      Patients Stated Pain Goal: 0 (07/28/23 0724)  Complications: No notable events documented.

## 2023-07-28 NOTE — H&P (Signed)
Patient seen and examined in preop holding.  No complaints. No changes to medication history or physical exam since last seen in clinic. After discussing the risks and benefits of L arm AV access, [name] elected to proceed.   [name] MD  ------------------------------------------------------------------------------------------------------------------------       Office Note     History of Present Illness    [name] [name] is a 73 y.o. ([birth year]) female who presents for dialysis access evaluation.  She has a history of right upper arm AV graft creation on 04/28/2021 by Dr. Edilia Bo.  This was the patient's first permanent dialysis access.   She presents today for repeat evaluation.  She states a couple of months ago her right upper arm AV graft occluded.  Since then she has been using a right sided tunneled dialysis catheter.  Prior to occlusion of the graft, the patient was having issues with hypotension.  She was told by her dialysis center that her graft likely occluded due to her low blood pressure.  Her blood pressure has been improving on midodrine.      She dialyzes on Tuesdays, Thursdays, and Saturdays.       Current Outpatient Medications  Medication Sig Dispense Refill   Ascorbic Acid 500 MG CAPS Take by mouth.       atorvastatin (LIPITOR) 40 MG tablet Take 1 tablet (40 mg total) by mouth daily. 90 tablet 3   Biotin 1000 MCG tablet Take by mouth.       carvedilol (COREG) 12.5 MG tablet Take 12.5 mg by mouth 2 (two) times daily with a meal.       citalopram (CELEXA) 10 MG tablet Take 1 tablet (10 mg total) by mouth daily. 90 tablet 3   diclofenac Sodium (VOLTAREN) 1 % GEL APPLY TWO GRAMS TOPICAALY FOUR TIMES DAILY 100 g 0   Ferrous Fumarate (HEMOCYTE - 106 MG FE) 324 (106 Fe) MG TABS tablet Take 27 mg of iron by mouth daily.       isosorbide mononitrate (IMDUR) 30 MG 24 hr tablet Take 15 mg by mouth daily.       LOPERAMIDE HCL PO Take by mouth.       LORazepam  (ATIVAN) 0.5 MG tablet Take 1 tablet (0.5 mg total) by mouth 2 (two) times daily as needed. for anxiety 60 tablet 0   Mesalamine 800 MG TBEC Take 1 tablet (800 mg total) by mouth 3 (three) times daily. 270 tablet 2   midodrine (PROAMATINE) 10 MG tablet Take 10 mg by mouth 3 (three) times a week.       mirtazapine (REMERON) 15 MG tablet TAKE ONE TABLET BY MOUTH AT BEDTIME 90 tablet 0   nystatin (MYCOSTATIN) 100000 UNIT/ML suspension Take 5 mLs by mouth 4 (four) times daily as needed.       pregabalin (LYRICA) 25 MG capsule Take 1 capsule (25 mg total) by mouth 2 (two) times daily. 14 capsule 0   sodium bicarbonate 650 MG tablet Take 1,300 mg by mouth 2 (two) times daily.       triamcinolone ointment (KENALOG) 0.1 % Apply 1 Application topically daily as needed.          No current facility-administered medications for this visit.        REVIEW OF SYSTEMS (negative unless checked):    Cardiac:  []  Chest pain or chest pressure? []  Shortness of breath upon activity? []  Shortness of breath when lying flat? []  Irregular heart rhythm?   Vascular:  []   Pain in calf, thigh, or hip brought on by walking? []  Pain in feet at night that wakes you up from your sleep? []  Blood clot in your veins? []  Leg swelling?   Pulmonary:  []  Oxygen at home? []  Productive cough? []  Wheezing?   Neurologic:  []  Sudden weakness in arms or legs? []  Sudden numbness in arms or legs? []  Sudden onset of difficult speaking or slurred speech? []  Temporary loss of vision in one eye? []  Problems with dizziness?   Gastrointestinal:  []  Blood in stool? []  Vomited blood?   Genitourinary:  []  Burning when urinating? []  Blood in urine?   Psychiatric:  []  Major depression   Hematologic:  []  Bleeding problems? []  Problems with blood clotting?   Dermatologic:  []  Rashes or ulcers?   Constitutional:  []  Fever or chills?   Ear/Nose/Throat:  []  Change in hearing? []  Nose bleeds? []  Sore throat?    Musculoskeletal:  []  Back pain? []  Joint pain? []  Muscle pain?     Physical Examination       Vitals:    06/25/23 0916  BP: 100/62  Pulse: 92  Resp: (!) 22  Temp: 98 F (36.7 C)  TempSrc: Temporal  SpO2: 95%  Weight: 134 lb 9.6 oz (61.1 kg)  Height: 5\' 6"  (1.676 m)    Body mass index is 21.73 kg/m.   General:  WDWN in NAD; vital signs documented above.  Right IJ TDC in place Gait: Not observed HENT: WNL, normocephalic Pulmonary: normal non-labored breathing , without rales, rhonchi,  wheezing Cardiac: regular Abdomen: soft, NT, no masses.  Left-sided ostomy bag Skin: without rashes Vascular Exam/Pulses: Palpable right radial pulse.  Palpable left radial and brachial pulses Extremities: occluded right upper arm AVG Musculoskeletal: no muscle wasting or atrophy       Neurologic: A&O X 3;  No focal weakness or paresthesias are detected Psychiatric:  The pt has Normal affect       Medical Decision Making    [name] [name] is a 73 y.o. female who presents for dialysis access   The patient has a history of ESRD with previous right upper arm AV graft created in 2022.  Unfortunately a couple of months ago the patient was dealing with hypotension issues, which likely led to graft occlusion.  Since then she has been dialyzing via right IJ TDC.  Her blood pressure has been improving on midodrine She returns today for repeat evaluation of permanent dialysis access.  She did not receive any vein mapping.  Her previous vein mapping in 2022 did not show any sizable veins for fistula On exam she has easily palpable left radial brachial pulses.  I explained to the patient her next dialysis access option would be in her left arm.  Potentially this could be a fistula, however given her small veins it would likely end up being in AV graft. I have explained to her the risks and benefits of dialysis access placement including infection, graft thrombosis, steal syndrome, bleeding, healing  issues, and need for subsequent interventions.  She is agreeable to pursue with access in the left arm.  We can schedule her for left upper arm AV fistula versus AV graft with Dr. Hetty Blend on a nondialysis day within the next several weeks.    Loel Dubonnet PA-C [facility] Office: [phone]   Clinic MD: Hetty Blend

## 2023-07-28 NOTE — Op Note (Signed)
OPERATIVE NOTE  PROCEDURE:   Intraoperative left arm vein mapping left arm brachial - axillary AVG creation  PRE-OPERATIVE DIAGNOSIS: ESRD  POST-OPERATIVE DIAGNOSIS: same as above   SURGEON: [name] MD  ASSISTANT(S): Aggie Moats, PA  Given the complexity of the case,  the assistant was necessary in order to expedient the procedure and safely perform the technical aspects of the operation.  The assistant provided traction and countertraction to assist with exposure of the artery and vein.  They also assisted with suture ligation of multiple venous branches. They also assisted with tunneling of the graft.  They played a critical role for both anastomoses.. These skills, especially following the Prolene suture for the anastomosis, could not have been adequately performed by a scrub tech assistant.  ANESTHESIA: general  ESTIMATED BLOOD LOSS: 20 cc  FINDING(S): No adequately sized superficial veins of the left arm Palpable and doppler thrill in AVG with multiphasic radial artery on completion  SPECIMEN(S):  none  INDICATIONS:   [name] [name] is a 73 y.o. female with ESRD. The patient is currently on dialysis via R internal jugular TDC. They were seen in the office for evaluation of hemodialysis access. The risks an benefits including of access creation were reviewed including: need for additional procedures, need for additional creations, steal, ischemia monomelic neuropathy, failure of access, and bleeding. The patient expressed understand and is willing to proceed.    DESCRIPTION: The patient was brought to the operating room positioned supine on the operating table.  The left arm was prepped and draped in usual sterile fashion.  Timeout was performed and preoperative antibiotics were administered.  Intraoperative vein mapping of the arm was performed which confirmed the preoperative vein mapping and demonstrated no adequately sized superficial veins of the left arm.  The  left brachial vein at the axilla was exposed using longitudinal incision.  Incision was carried down until the brachial sheath was encountered.  The brachial vein was identified, exposed, encircled with a Silastic Vesseloop. The brachial artery was exposed using a transverse incision in the distal arm just above the antecubital fossa.  Incision was carried down through subcutaneous tissue until the brachial sheath was encountered.  This was incised sharply.  The brachial artery was exposed and encircled with a Silastic Vesseloop. Using a curved, sheathed tunneling device, a 4-7 mm tapered Gore-Tex graft was tunneled subcutaneously and gentle arc across the biceps of the left arm.  The patient was then heparinized with 3000 units of IV heparin.   The brachial artery was clamped proximally and distally.  An anterior arteriotomy was made with an 11 blade.  This was extended with Potts scissors.  The 4 mm end of the Gore-Tex graft was spatulated and then anastomosed end-to-side to the brachial arteriotomy using continuous running suture of 6-0 Prolene.  The anastomosis was completed and hemostasis ensured.  The graft was clamped to restore perfusion to the hand. The brachial vein was clamped proximally and distally.  An anterior venotomy was made with an 11 blade.  This was extended with Potts scissors.  The 7 mm end of the Gore-Tex graft was then anastomosed end to side to the brachial vein venotomy using continuous running suture of 6-0 Prolene. Immediately prior to completion the anastomosis was de-aired and flushed.  Anastomosis was then completed.  Hemostasis was insured.   Doppler machine was brought onto the field to interrogate the graft.  Excellent doppler flow was noted in the radial artery. Distal to the venous  anastomosis a Doppler bruit was heard. The incisions were copiously irrigated and then closed in layers with 3-0 Vicryl and 4-0 Monocryl for the skin. Dermabond was applied. All counts were  correct at the end of the case. The patient tolerated the procedure well and was brought to recovery in stable condition.    COMPLICATIONS: none apparent  CONDITION: stable  [name] MD [facility] Phone Number: [phone] 07/28/2023 10:45 AM

## 2023-07-28 NOTE — Anesthesia Procedure Notes (Signed)
Procedure Name: Intubation Date/Time: 07/28/2023 8:51 AM  Performed by: Marena Chancy, CRNAPre-anesthesia Checklist: Patient identified, Emergency Drugs available, Suction available and Patient being monitored Patient Re-evaluated:Patient Re-evaluated prior to induction Oxygen Delivery Method: Circle System Utilized Preoxygenation: Pre-oxygenation with 100% oxygen Induction Type: IV induction Ventilation: Mask ventilation without difficulty Laryngoscope Size: Miller and 2 Grade View: Grade I Tube type: Oral Tube size: 7.0 mm Number of attempts: 1 Airway Equipment and Method: Stylet and Oral airway Placement Confirmation: ETT inserted through vocal cords under direct vision, positive ETCO2 and breath sounds checked- equal and bilateral Tube secured with: Tape Dental Injury: Teeth and Oropharynx as per pre-operative assessment

## 2023-07-28 NOTE — Progress Notes (Signed)
Dr. Maple Hudson made aware of patient's BP readings upon arrival to Short Stay. 90/57 and 95/60. OK per Dr. Maple Hudson. Patient denies any complaints.

## 2023-07-29 ENCOUNTER — Encounter (HOSPITAL_COMMUNITY): Payer: Self-pay | Admitting: Vascular Surgery

## 2023-07-29 DIAGNOSIS — T8249XA Other complication of vascular dialysis catheter, initial encounter: Secondary | ICD-10-CM | POA: Diagnosis not present

## 2023-07-29 DIAGNOSIS — N2581 Secondary hyperparathyroidism of renal origin: Secondary | ICD-10-CM | POA: Diagnosis not present

## 2023-07-29 DIAGNOSIS — R52 Pain, unspecified: Secondary | ICD-10-CM | POA: Diagnosis not present

## 2023-07-29 DIAGNOSIS — D631 Anemia in chronic kidney disease: Secondary | ICD-10-CM | POA: Diagnosis not present

## 2023-07-29 DIAGNOSIS — N186 End stage renal disease: Secondary | ICD-10-CM | POA: Diagnosis not present

## 2023-07-29 DIAGNOSIS — D689 Coagulation defect, unspecified: Secondary | ICD-10-CM | POA: Diagnosis not present

## 2023-07-29 DIAGNOSIS — Z992 Dependence on renal dialysis: Secondary | ICD-10-CM | POA: Diagnosis not present

## 2023-07-31 DIAGNOSIS — D631 Anemia in chronic kidney disease: Secondary | ICD-10-CM | POA: Diagnosis not present

## 2023-07-31 DIAGNOSIS — N186 End stage renal disease: Secondary | ICD-10-CM | POA: Diagnosis not present

## 2023-07-31 DIAGNOSIS — R52 Pain, unspecified: Secondary | ICD-10-CM | POA: Diagnosis not present

## 2023-07-31 DIAGNOSIS — D689 Coagulation defect, unspecified: Secondary | ICD-10-CM | POA: Diagnosis not present

## 2023-07-31 DIAGNOSIS — T8249XA Other complication of vascular dialysis catheter, initial encounter: Secondary | ICD-10-CM | POA: Diagnosis not present

## 2023-07-31 DIAGNOSIS — Z992 Dependence on renal dialysis: Secondary | ICD-10-CM | POA: Diagnosis not present

## 2023-07-31 DIAGNOSIS — N2581 Secondary hyperparathyroidism of renal origin: Secondary | ICD-10-CM | POA: Diagnosis not present

## 2023-08-01 NOTE — Anesthesia Postprocedure Evaluation (Signed)
Anesthesia Post Note  Patient: [name] [name]  Procedure(s) Performed: LEFT ARM ARTERIOVENOUS (AV) GRAFT USING CORE-TEX STRETCH VASCULAR (Left)     Patient location during evaluation: PACU Anesthesia Type: General Level of consciousness: awake and alert Pain management: pain level controlled Vital Signs Assessment: post-procedure vital signs reviewed and stable Respiratory status: spontaneous breathing, nonlabored ventilation and respiratory function stable Cardiovascular status: blood pressure returned to baseline and stable Postop Assessment: no apparent nausea or vomiting Anesthetic complications: no   No notable events documented.  Last Vitals:  Vitals:   07/28/23 1115 07/28/23 1130  BP: 119/67 123/72  Pulse: 94 88  Resp: 19 16  Temp:  36.7 C  SpO2: 94% 93%    Last Pain:  Vitals:   07/28/23 1115  PainSc: 4                  Baylon Santelli

## 2023-08-03 DIAGNOSIS — N186 End stage renal disease: Secondary | ICD-10-CM | POA: Diagnosis not present

## 2023-08-03 DIAGNOSIS — D631 Anemia in chronic kidney disease: Secondary | ICD-10-CM | POA: Diagnosis not present

## 2023-08-03 DIAGNOSIS — T8249XA Other complication of vascular dialysis catheter, initial encounter: Secondary | ICD-10-CM | POA: Diagnosis not present

## 2023-08-03 DIAGNOSIS — R52 Pain, unspecified: Secondary | ICD-10-CM | POA: Diagnosis not present

## 2023-08-03 DIAGNOSIS — N2581 Secondary hyperparathyroidism of renal origin: Secondary | ICD-10-CM | POA: Diagnosis not present

## 2023-08-03 DIAGNOSIS — D689 Coagulation defect, unspecified: Secondary | ICD-10-CM | POA: Diagnosis not present

## 2023-08-03 DIAGNOSIS — Z992 Dependence on renal dialysis: Secondary | ICD-10-CM | POA: Diagnosis not present

## 2023-08-05 ENCOUNTER — Emergency Department (HOSPITAL_COMMUNITY): Payer: 59

## 2023-08-05 ENCOUNTER — Other Ambulatory Visit: Payer: Self-pay

## 2023-08-05 ENCOUNTER — Emergency Department (HOSPITAL_COMMUNITY)
Admission: EM | Admit: 2023-08-05 | Discharge: 2023-08-06 | Disposition: A | Discharge status: Home / Self Care | Payer: 59 | Attending: Emergency Medicine | Admitting: Emergency Medicine

## 2023-08-05 ENCOUNTER — Encounter (HOSPITAL_COMMUNITY): Payer: Self-pay | Admitting: Emergency Medicine

## 2023-08-05 DIAGNOSIS — M79602 Pain in left arm: Secondary | ICD-10-CM | POA: Diagnosis not present

## 2023-08-05 DIAGNOSIS — N2581 Secondary hyperparathyroidism of renal origin: Secondary | ICD-10-CM | POA: Diagnosis not present

## 2023-08-05 DIAGNOSIS — R6889 Other general symptoms and signs: Secondary | ICD-10-CM | POA: Diagnosis not present

## 2023-08-05 DIAGNOSIS — Z79899 Other long term (current) drug therapy: Secondary | ICD-10-CM | POA: Insufficient documentation

## 2023-08-05 DIAGNOSIS — J45909 Unspecified asthma, uncomplicated: Secondary | ICD-10-CM | POA: Diagnosis not present

## 2023-08-05 DIAGNOSIS — D689 Coagulation defect, unspecified: Secondary | ICD-10-CM | POA: Diagnosis not present

## 2023-08-05 DIAGNOSIS — I951 Orthostatic hypotension: Secondary | ICD-10-CM | POA: Diagnosis not present

## 2023-08-05 DIAGNOSIS — N186 End stage renal disease: Secondary | ICD-10-CM | POA: Insufficient documentation

## 2023-08-05 DIAGNOSIS — I509 Heart failure, unspecified: Secondary | ICD-10-CM | POA: Diagnosis not present

## 2023-08-05 DIAGNOSIS — I2510 Atherosclerotic heart disease of native coronary artery without angina pectoris: Secondary | ICD-10-CM | POA: Insufficient documentation

## 2023-08-05 DIAGNOSIS — R52 Pain, unspecified: Secondary | ICD-10-CM | POA: Diagnosis not present

## 2023-08-05 DIAGNOSIS — I132 Hypertensive heart and chronic kidney disease with heart failure and with stage 5 chronic kidney disease, or end stage renal disease: Secondary | ICD-10-CM | POA: Diagnosis not present

## 2023-08-05 DIAGNOSIS — M25512 Pain in left shoulder: Secondary | ICD-10-CM | POA: Diagnosis not present

## 2023-08-05 DIAGNOSIS — T8249XA Other complication of vascular dialysis catheter, initial encounter: Secondary | ICD-10-CM | POA: Diagnosis not present

## 2023-08-05 DIAGNOSIS — Z743 Need for continuous supervision: Secondary | ICD-10-CM | POA: Diagnosis not present

## 2023-08-05 DIAGNOSIS — M79629 Pain in unspecified upper arm: Secondary | ICD-10-CM | POA: Diagnosis not present

## 2023-08-05 DIAGNOSIS — Z992 Dependence on renal dialysis: Secondary | ICD-10-CM | POA: Diagnosis not present

## 2023-08-05 DIAGNOSIS — D631 Anemia in chronic kidney disease: Secondary | ICD-10-CM | POA: Diagnosis not present

## 2023-08-05 LAB — CBC WITH DIFFERENTIAL/PLATELET
Abs Immature Granulocytes: 0.05 10*3/uL (ref 0.00–0.07)
Basophils Absolute: 0 10*3/uL (ref 0.0–0.1)
Basophils Relative: 0 %
Eosinophils Absolute: 0.1 10*3/uL (ref 0.0–0.5)
Eosinophils Relative: 2 %
HCT: 34.3 % — ABNORMAL LOW (ref 36.0–46.0)
Hemoglobin: 10.6 g/dL — ABNORMAL LOW (ref 12.0–15.0)
Immature Granulocytes: 1 %
Lymphocytes Relative: 23 %
Lymphs Abs: 1.7 10*3/uL (ref 0.7–4.0)
MCH: 31.3 pg (ref 26.0–34.0)
MCHC: 30.9 g/dL (ref 30.0–36.0)
MCV: 101.2 fL — ABNORMAL HIGH (ref 80.0–100.0)
Monocytes Absolute: 0.8 10*3/uL (ref 0.1–1.0)
Monocytes Relative: 11 %
Neutro Abs: 4.6 10*3/uL (ref 1.7–7.7)
Neutrophils Relative %: 63 %
Platelets: 142 10*3/uL — ABNORMAL LOW (ref 150–400)
RBC: 3.39 MIL/uL — ABNORMAL LOW (ref 3.87–5.11)
RDW: 18.6 % — ABNORMAL HIGH (ref 11.5–15.5)
WBC: 7.3 10*3/uL (ref 4.0–10.5)
nRBC: 1.6 % — ABNORMAL HIGH (ref 0.0–0.2)

## 2023-08-05 LAB — COMPREHENSIVE METABOLIC PANEL
ALT: 29 U/L (ref 0–44)
AST: 42 U/L — ABNORMAL HIGH (ref 15–41)
Albumin: 3.9 g/dL (ref 3.5–5.0)
Alkaline Phosphatase: 173 U/L — ABNORMAL HIGH (ref 38–126)
Anion gap: 16 — ABNORMAL HIGH (ref 5–15)
BUN: 7 mg/dL — ABNORMAL LOW (ref 8–23)
CO2: 24 mmol/L (ref 22–32)
Calcium: 9.1 mg/dL (ref 8.9–10.3)
Chloride: 96 mmol/L — ABNORMAL LOW (ref 98–111)
Creatinine, Ser: 3.8 mg/dL — ABNORMAL HIGH (ref 0.44–1.00)
GFR, Estimated: 12 mL/min — ABNORMAL LOW (ref >60)
Glucose, Bld: 86 mg/dL (ref 70–99)
Potassium: 3.8 mmol/L (ref 3.5–5.1)
Sodium: 136 mmol/L (ref 135–145)
Total Bilirubin: 0.8 mg/dL (ref 0.0–1.2)
Total Protein: 8 g/dL (ref 6.5–8.1)

## 2023-08-05 LAB — PROTIME-INR
INR: 1 (ref 0.8–1.2)
Prothrombin Time: 13.1 s (ref 11.4–15.2)

## 2023-08-05 MED ORDER — MIDODRINE HCL 5 MG PO TABS
10.0000 mg | ORAL_TABLET | Freq: Once | ORAL | Status: AC
  Administered 2023-08-05: 10 mg via ORAL
  Filled 2023-08-05: qty 2

## 2023-08-05 MED ORDER — SODIUM CHLORIDE 0.9 % IV BOLUS
500.0000 mL | Freq: Once | INTRAVENOUS | Status: AC
  Administered 2023-08-06: 500 mL via INTRAVENOUS

## 2023-08-05 MED ORDER — SODIUM CHLORIDE 0.9 % IV BOLUS: 250.0000 mL | Freq: Once | INTRAVENOUS | Status: DC

## 2023-08-05 MED ORDER — OXYCODONE HCL 5 MG PO TABS
5.0000 mg | ORAL_TABLET | Freq: Once | ORAL | Status: AC
  Administered 2023-08-05: 5 mg via ORAL
  Filled 2023-08-05: qty 1

## 2023-08-05 NOTE — ED Provider Notes (Signed)
[place] EMERGENCY DEPARTMENT AT [facility] Provider Note   CSN: [id number] Arrival date & time: 08/05/23  1114     History {Add pertinent medical, surgical, social history, OB history to HPI:1} Chief Complaint  Patient presents with   Fistula Problem    [name] [name] is a 73 y.o. female.  HPI        73 year old female with a history of hypertension, hyperlipidemia, coronary artery disease, Crohn's disease, CHF, ESRD on dialysis TThS, recent AV graft placement January 22 with Dr. Hetty Blend, presents with concern for tingling and pain of her left arm and hand.  AV graft Dr. Hetty Blend 1/22  1/25 betan having pain in ulnar nerve distribution with numbness  Reports that 3 days after the procedure, she began having pain at the superior incision of her graft site.  Reports she has pain in this area and then it radiates down the medial portion of her arm to her small and ring finger.  Describes a pins-and-needles feeling on the side of her arm.  Reports that she has normal function of the arm and strength, but at times will have some spasm.  She will lift her arm up above her head to provide her with some relief.   Denies symptoms of chest pain, shortness of breath, abdominal pain, nausea, vomiting, diarrhea, black or bloody stools, fever, cough Past Medical History:  Diagnosis Date   Abnormal chest CT    Coronary atherosclerosis on chest CT 2012   Anemia, chronic renal failure    Anxiety    Arthritis    Asthma 05/2011   CAD (coronary artery disease)    Carpal tunnel syndrome    CHF (congestive heart failure) (HCC)    EF 30-35% 2012->EF 60-65% 2013   CKD (chronic kidney disease), stage III (HCC)    Crohn's disease (HCC)    GERD (gastroesophageal reflux disease)    Headache(784.0)    "related to high BP" (05/30/2012)   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 Authorizing Provider  Ascorbic Acid 500 MG CAPS Take 500 mg by mouth daily. 04/30/21  Yes [provider]  atorvastatin (LIPITOR) 40 MG tablet Take 1 tablet (40 mg total) by mouth daily. 05/18/23  Yes Westley Chandler, MD  citalopram (CELEXA) 10 MG tablet Take 1 tablet (10 mg total) by mouth daily. 06/07/23  Yes Caro Laroche, DO  diclofenac Sodium (VOLTAREN) 1 % GEL APPLY TWO GRAMS TOPICAALY FOUR TIMES DAILY Patient taking differently: Apply 2 g topically daily as needed (for pain). 06/22/23  Yes Caro Laroche, DO  Glycerin (CLEAR EYES ADV DRY & ITCHY RLF) 0.25 % SOLN Place 1 drop into both eyes daily as needed (Itching).   Yes [provider]  lidocaine-prilocaine (EMLA) cream Apply 1 Application topically Every Tuesday,Thursday,and Saturday with dialysis.   Yes [provider]  LORazepam (ATIVAN) 0.5 MG tablet Take 1 tablet (0.5 mg total) by mouth 2 (two) times daily as needed. for anxiety 07/19/23  Yes Caro Laroche, DO  Mesalamine 800 MG TBEC Take 1 tablet (800 mg total) by mouth 3 (three) times  daily. 01/20/23  Yes Caro Laroche, DO  midodrine (PROAMATINE) 10 MG tablet Take 10 mg by mouth Every Tuesday,Thursday,and Saturday with dialysis.   Yes [provider]  mirtazapine (REMERON) 15 MG tablet TAKE ONE TABLET BY MOUTH AT BEDTIME 07/19/23  Yes Rumball, Darl Householder, DO  pregabalin (LYRICA) 25 MG capsule Take 1 capsule (25 mg total) by mouth 2 (two) times daily. 07/19/23  Yes Caro Laroche, DO  triamcinolone ointment (KENALOG) 0.1 % Apply 1 Application topically daily as needed (Rash). 12/16/18  Yes [provider]      Allergies    Amoxicillin, Aspirin, Ciprofloxacin, Morphine and codeine, Penicillins, Gabapentin, and Ace inhibitors    Review of Systems    Review of Systems  Physical Exam Updated Vital Signs BP (!) 96/56   Pulse 77   Temp 98.5 F (36.9 C)   Resp 16   SpO2 100%  Physical Exam  ED Results / Procedures / Treatments   Labs (all labs ordered are listed, but only abnormal results are displayed) Labs Reviewed  CBC WITH DIFFERENTIAL/PLATELET - Abnormal; Notable for the following components:      Result Value   RBC 3.39 (*)    Hemoglobin 10.6 (*)    HCT 34.3 (*)    MCV 101.2 (*)    RDW 18.6 (*)    Platelets 142 (*)    nRBC 1.6 (*)    All other components within normal limits  COMPREHENSIVE METABOLIC PANEL - Abnormal; Notable for the following components:   Chloride 96 (*)    BUN 7 (*)    Creatinine, Ser 3.80 (*)    AST 42 (*)    Alkaline Phosphatase 173 (*)    GFR, Estimated 12 (*)    Anion gap 16 (*)    All other components within normal limits  PROTIME-INR    EKG None  Radiology VAS US DUPLEX DIALYSIS ACCESS (AVF, AVG) Result Date: 08/05/2023 DIALYSIS ACCESS Patient Name:  [name] [name]  Date of Exam:   08/05/2023 Medical Rec #: [id number]    Accession #:    [id number] Date of Birth: [birth year]   Patient Gender: F Patient Age:   89 years Exam Location:  [facility] Procedure:      VAS US DUPLEX DIALYSIS ACCESS (AVF, AVG) Referring Phys: HAYLEY NAASZ --------------------------------------------------------------------------------  Reason for Exam: Pain post AVG placement. Access Site: Left Upper Extremity. Access Type: Upper arm straight graft. History: AVG placement 07/28/2023. Limitations: Pain intolerance Comparison Study: No prior study on file Performing Technologist: Jody Hill RVT, RDMS  Examination Guidelines: A complete evaluation includes B-mode imaging, spectral Doppler, color Doppler, and power Doppler as needed of all accessible portions of each vessel. Unilateral testing is considered an integral part of a complete examination. Limited examinations for reoccurring indications may be performed as  noted.  Findings:   +--------------------+----------+-----------------+--------+ AVG                 PSV (cm/s)Flow Vol (mL/min)Describe +--------------------+----------+-----------------+--------+ Native artery inflow   160           610                +--------------------+----------+-----------------+--------+ Arterial anastomosis   279                              +--------------------+----------+-----------------+--------+ Prox graft             354                              +--------------------+----------+-----------------+--------+  Mid graft               77                              +--------------------+----------+-----------------+--------+ Distal graft            60                              +--------------------+----------+-----------------+--------+ Venous anastomosis      77                              +--------------------+----------+-----------------+--------+ Venous outflow          48                              +--------------------+----------+-----------------+--------+  Summary: AVG appears patent, arteriovenous graft-Velocities of less than 100cm/s noted. *See table(s) above for measurements and observations.    --------------------------------------------------------------------------------   Preliminary     Procedures Procedures  {Document cardiac monitor, telemetry assessment procedure when appropriate:1}  Medications Ordered in ED Medications  oxyCODONE (Oxy IR/ROXICODONE) immediate release tablet 5 mg (5 mg Oral Given 08/05/23 1247)  midodrine (PROAMATINE) tablet 10 mg (10 mg Oral Given 08/05/23 1247)    ED Course/ Medical Decision Making/ A&P   {   Click here for ABCD2, HEART and other calculatorsREFRESH Note before signing :1}                              Medical Decision Making Risk Prescription drug management.   73 year old female with a history of hypertension, hyperlipidemia, coronary artery disease, Crohn's  disease, CHF, ESRD on dialysis TThS, recent AV graft placement January 22 with Dr. Hetty Blend, presents with concern for tingling and pain of her left arm and hand.    {Document critical care time when appropriate:1} {Document review of labs and clinical decision tools ie heart score, Chads2Vasc2 etc:1}  {Document your independent review of radiology images, and any outside records:1} {Document your discussion with family members, caretakers, and with consultants:1} {Document social determinants of health affecting pt's care:1} {Document your decision making why or why not admission, treatments were needed:1} Final Clinical Impression(s) / ED Diagnoses Final diagnoses:  None    Rx / DC Orders ED Discharge Orders     None

## 2023-08-05 NOTE — Progress Notes (Signed)
LUE DIA access duplex has been completed.  Preliminary results given to  Dr. Dalene Seltzer.   Results can be found under chart review under CV PROC. 08/05/2023 5:08 PM [name] [name] RVT, RDMS

## 2023-08-05 NOTE — ED Notes (Signed)
MD and charge nurse made aware of pt blood pressure.

## 2023-08-05 NOTE — ED Triage Notes (Signed)
Pt BIB [facility] from dialysis with reports of left arm numbness and tingling that started on Jan 25th. Pt reports she had new fistula placed on the 22nd.

## 2023-08-05 NOTE — ED Provider Triage Note (Addendum)
Emergency Medicine Provider Triage Evaluation Note  [name] [name] , a 73 y.o. female  was evaluated in triage.  Pt complains of numbness/tingling in her left arm now turned burning/nerve pain today that originally started on 1/25 after a fistula surgery she had on 1/22. Significant TTP in that area as well. Per chart review had left arm brachial/axillary AVG creation. Denies f/c. Denies asymmetric weakness, any numbness/tingling anywhere else, difficulties with speech. Had dialysis this AM, got full session, had some tylenol while she was there.   Review of Systems  Positive: Numbness/tingling now turned pain in her LUE  Negative: Facial droop, asymmetric weakness  Physical Exam  BP (!) 90/56 (BP Location: Right Arm)   Pulse 93   Temp 98.5 F (36.9 C)   Resp 17   SpO2 98%  Gen:   Awake, no distress   Resp:  Normal effort  MSK:   AV fistula on LUE with significant TTP, no palpable thrill. Intact distal radial pulse in the LUE. Some numbness in ulnar nerve distribution on the LUE. Old fistula on the RUE, with also intact radial pulse.   Medical Decision Making  Medically screening exam initiated at 12:23 PM.  Appropriate orders placed.  Reyanne Hussar was informed that the remainder of the evaluation will be completed by another provider, this initial triage assessment does not replace that evaluation, and the importance of remaining in the ED until their evaluation is complete.  Patient is noted to have BP 90/56 with a history of hypotension on midodrine. She states she is due for her midodrine now. 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 Will begin eval w/ ultrasound. Will give midodrine/oxycodone.     Loetta Rough, MD 08/05/23 1230

## 2023-08-06 ENCOUNTER — Telehealth: Payer: Self-pay | Admitting: Vascular Surgery

## 2023-08-06 DIAGNOSIS — I129 Hypertensive chronic kidney disease with stage 1 through stage 4 chronic kidney disease, or unspecified chronic kidney disease: Secondary | ICD-10-CM | POA: Diagnosis not present

## 2023-08-06 DIAGNOSIS — M79602 Pain in left arm: Secondary | ICD-10-CM | POA: Diagnosis not present

## 2023-08-06 DIAGNOSIS — N186 End stage renal disease: Secondary | ICD-10-CM | POA: Diagnosis not present

## 2023-08-06 DIAGNOSIS — Z992 Dependence on renal dialysis: Secondary | ICD-10-CM | POA: Diagnosis not present

## 2023-08-06 NOTE — Discharge Instructions (Signed)
You were seen today for low blood pressure following dialysis.  Make sure that you are staying well-hydrated.

## 2023-08-06 NOTE — ED Provider Notes (Signed)
Patient signed out pending fluid bolus.  Initially came in with some concerns for vascular access fistula complication.  Workup was largely benign.  She was noted to be hypotensive.  She received dialysis earlier today.  Will sometimes get hypotensive postdialysis and does require midodrine.  Was orthostatic and symptomatic.  She is awaiting fluids.  2:32 AM Patient clinically improved.  Blood pressure low 100s.  Normal 110.  Feel she is safe for discharge home.   Shon Baton, MD 08/06/23 [phone]

## 2023-08-06 NOTE — ED Notes (Signed)
Pt given taxi voucher for discharge.

## 2023-08-07 DIAGNOSIS — E8770 Fluid overload, unspecified: Secondary | ICD-10-CM | POA: Diagnosis not present

## 2023-08-07 DIAGNOSIS — Z992 Dependence on renal dialysis: Secondary | ICD-10-CM | POA: Diagnosis not present

## 2023-08-07 DIAGNOSIS — R52 Pain, unspecified: Secondary | ICD-10-CM | POA: Diagnosis not present

## 2023-08-07 DIAGNOSIS — D689 Coagulation defect, unspecified: Secondary | ICD-10-CM | POA: Diagnosis not present

## 2023-08-07 DIAGNOSIS — N2581 Secondary hyperparathyroidism of renal origin: Secondary | ICD-10-CM | POA: Diagnosis not present

## 2023-08-07 DIAGNOSIS — D688 Other specified coagulation defects: Secondary | ICD-10-CM | POA: Diagnosis not present

## 2023-08-07 DIAGNOSIS — T8249XA Other complication of vascular dialysis catheter, initial encounter: Secondary | ICD-10-CM | POA: Diagnosis not present

## 2023-08-07 DIAGNOSIS — N186 End stage renal disease: Secondary | ICD-10-CM | POA: Diagnosis not present

## 2023-08-07 DIAGNOSIS — D631 Anemia in chronic kidney disease: Secondary | ICD-10-CM | POA: Diagnosis not present

## 2023-08-09 ENCOUNTER — Other Ambulatory Visit: Payer: Self-pay | Admitting: Gastroenterology

## 2023-08-09 DIAGNOSIS — K50118 Crohn's disease of large intestine with other complication: Secondary | ICD-10-CM | POA: Diagnosis not present

## 2023-08-09 DIAGNOSIS — Z8679 Personal history of other diseases of the circulatory system: Secondary | ICD-10-CM | POA: Diagnosis not present

## 2023-08-09 DIAGNOSIS — R945 Abnormal results of liver function studies: Secondary | ICD-10-CM | POA: Diagnosis not present

## 2023-08-09 DIAGNOSIS — Z939 Artificial opening status, unspecified: Secondary | ICD-10-CM | POA: Diagnosis not present

## 2023-08-09 DIAGNOSIS — R7989 Other specified abnormal findings of blood chemistry: Secondary | ICD-10-CM

## 2023-08-09 DIAGNOSIS — Z9049 Acquired absence of other specified parts of digestive tract: Secondary | ICD-10-CM | POA: Diagnosis not present

## 2023-08-09 DIAGNOSIS — D649 Anemia, unspecified: Secondary | ICD-10-CM | POA: Diagnosis not present

## 2023-08-09 NOTE — Telephone Encounter (Signed)
Pt appointment scheduled.

## 2023-08-10 DIAGNOSIS — D688 Other specified coagulation defects: Secondary | ICD-10-CM | POA: Diagnosis not present

## 2023-08-10 DIAGNOSIS — Z992 Dependence on renal dialysis: Secondary | ICD-10-CM | POA: Diagnosis not present

## 2023-08-10 DIAGNOSIS — T8249XA Other complication of vascular dialysis catheter, initial encounter: Secondary | ICD-10-CM | POA: Diagnosis not present

## 2023-08-10 DIAGNOSIS — R52 Pain, unspecified: Secondary | ICD-10-CM | POA: Diagnosis not present

## 2023-08-10 DIAGNOSIS — D631 Anemia in chronic kidney disease: Secondary | ICD-10-CM | POA: Diagnosis not present

## 2023-08-10 DIAGNOSIS — E8770 Fluid overload, unspecified: Secondary | ICD-10-CM | POA: Diagnosis not present

## 2023-08-10 DIAGNOSIS — N2581 Secondary hyperparathyroidism of renal origin: Secondary | ICD-10-CM | POA: Diagnosis not present

## 2023-08-10 DIAGNOSIS — N186 End stage renal disease: Secondary | ICD-10-CM | POA: Diagnosis not present

## 2023-08-10 DIAGNOSIS — D689 Coagulation defect, unspecified: Secondary | ICD-10-CM | POA: Diagnosis not present

## 2023-08-11 DIAGNOSIS — D688 Other specified coagulation defects: Secondary | ICD-10-CM | POA: Diagnosis not present

## 2023-08-11 DIAGNOSIS — E8770 Fluid overload, unspecified: Secondary | ICD-10-CM | POA: Diagnosis not present

## 2023-08-11 DIAGNOSIS — R52 Pain, unspecified: Secondary | ICD-10-CM | POA: Diagnosis not present

## 2023-08-11 DIAGNOSIS — Z992 Dependence on renal dialysis: Secondary | ICD-10-CM | POA: Diagnosis not present

## 2023-08-11 DIAGNOSIS — T8249XA Other complication of vascular dialysis catheter, initial encounter: Secondary | ICD-10-CM | POA: Diagnosis not present

## 2023-08-11 DIAGNOSIS — N186 End stage renal disease: Secondary | ICD-10-CM | POA: Diagnosis not present

## 2023-08-11 DIAGNOSIS — N2581 Secondary hyperparathyroidism of renal origin: Secondary | ICD-10-CM | POA: Diagnosis not present

## 2023-08-11 DIAGNOSIS — D689 Coagulation defect, unspecified: Secondary | ICD-10-CM | POA: Diagnosis not present

## 2023-08-11 DIAGNOSIS — D631 Anemia in chronic kidney disease: Secondary | ICD-10-CM | POA: Diagnosis not present

## 2023-08-11 NOTE — H&P (View-Only) (Signed)
 Patient ID: [name] [name], female   DOB: 07-01-1951, 73 y.o.   MRN: [id number]  Reason for Consult: Post-op Problem   Referred by Madelon Donald HERO, DO  Subjective:     HPI  [name] [name] is a 73 y.o. female who presents for follow-up after left brachial axillary AV graft creation.  Since the surgery she has had some pins-and-needles pain from her arm pit into her fourth and fifth fingers.  She denies severe pain in the hand or weakness.  She reports she has normal function.  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   Hyperlipidemia    Hypertension    Hypertensive retinopathy    OU   Osteopenia    Panic attacks    Reflux esophagitis    Seizure (HCC)    hx of   Tobacco abuse    Vitamin D  deficiency    Family History  Problem Relation Age of Onset   Heart disease Mother    Other Mother        Covid   Glaucoma Mother    Stroke Father    Other Sister        AIDS   Past Surgical History:  Procedure Laterality Date   AV FISTULA PLACEMENT Right 04/28/2021   Procedure: RIGHT ARTERIOVENOUS (AV) GRAFT INSERTION USING 4-7MM x 45CM GORE-TEX GRAFT;  Surgeon: Eliza Lonni RAMAN, MD;  Location: [facility] OR;  Service: Vascular;  Laterality: Right;   AV FISTULA PLACEMENT Left 07/28/2023   Procedure: LEFT ARM ARTERIOVENOUS (AV) GRAFT USING CORE-TEX STRETCH VASCULAR;  Surgeon: Pearline Norman RAMAN, MD;  Location: [facility] OR;  Service: Vascular;  Laterality: Left;   CATARACT EXTRACTION Bilateral    Dr. Lamar Gaudy    CATARACT EXTRACTION W/ INTRAOCULAR LENS  IMPLANT, BILATERAL  ~ 2000   CHOLECYSTECTOMY  01/28/2005   COLOSTOMY  03/1996   diverting   ECTOPIC PREGNANCY SURGERY  ?1980's   left   EYE SURGERY Bilateral    Cat Sx - Dr. Lamar Gaudy   ILEOSTOMY  ?  2002   IR FLUORO GUIDE CV LINE RIGHT  04/25/2021   IR US  GUIDE VASC ACCESS RIGHT  04/25/2021   NECK SURGERY  2020   pinched nerve    Short Social History:  Social History   Tobacco Use   Smoking status: Every Day    Current packs/day: 0.12    Average packs/day: 0.1 packs/day for 30.0 years (3.6 ttl pk-yrs)    Types: Cigarettes   Smokeless tobacco: Never   Tobacco comments:    1 pack lasts almost 2 weeks per pt  Substance Use Topics   Alcohol use: Not Currently    Comment: 05/30/2012 used to drink back in the day; last alcohol 23 yr ago    Allergies  Allergen Reactions   Amoxicillin Anaphylaxis, Rash and Other (See Comments)    Throat Swelling   Aspirin Anaphylaxis, Itching, Rash and  Other (See Comments)   Ciprofloxacin  Swelling    Possible reaction to cipro  or clindamycin  04/29/14 Possible reaction to cipro  or clindamycin  04/29/14   Morphine  And Codeine Anaphylaxis   Penicillins Anaphylaxis, Rash and Other (See Comments)    Throat Swelling   Gabapentin  Other (See Comments)    Patient had one time seizure shortly after stopping gabapentin    Ace Inhibitors Other (See Comments) and Cough    ACE possibly associated with cough and switched to ARB.  Would be OK to re-challenge if needed.    Current Outpatient Medications  Medication Sig Dispense Refill   Ascorbic Acid  500 MG CAPS Take 500 mg by mouth daily.     atorvastatin  (LIPITOR) 40 MG tablet Take 1 tablet (40 mg total) by mouth daily. 90 tablet 3   citalopram  (CELEXA ) 10 MG tablet Take 1 tablet (10 mg total) by mouth daily. 90 tablet 3   diclofenac  Sodium (VOLTAREN ) 1 % GEL APPLY TWO GRAMS TOPICAALY FOUR TIMES DAILY (Patient taking differently: Apply 2 g topically daily as  needed (for pain).) 100 g 0   Glycerin (CLEAR EYES ADV DRY & ITCHY RLF) 0.25 % SOLN Place 1 drop into both eyes daily as needed (Itching).     lidocaine -prilocaine  (EMLA ) cream Apply 1 Application topically Every Tuesday,Thursday,and Saturday with dialysis.     LORazepam  (ATIVAN ) 0.5 MG tablet Take 1 tablet (0.5 mg total) by mouth 2 (two) times daily as needed. for anxiety 60 tablet 0   Mesalamine  800 MG TBEC Take 1 tablet (800 mg total) by mouth 3 (three) times daily. 270 tablet 2   midodrine  (PROAMATINE ) 10 MG tablet Take 10 mg by mouth Every Tuesday,Thursday,and Saturday with dialysis.     mirtazapine  (REMERON ) 15 MG tablet TAKE ONE TABLET BY MOUTH AT BEDTIME 90 tablet 3   pregabalin  (LYRICA ) 25 MG capsule Take 1 capsule (25 mg total) by mouth 2 (two) times daily. 180 capsule 1   triamcinolone  ointment (KENALOG ) 0.1 % Apply 1 Application topically daily as needed (Rash).     No current facility-administered medications for this visit.    REVIEW OF SYSTEMS   All other systems were reviewed and are negative     Objective:  Objective   Vitals:   08/13/23 0905  BP: 97/62  Pulse: (!) 103  Resp: 16  Temp: 97.9 F (36.6 C)  SpO2: 91%  Weight: 138 lb (62.6 kg)  Height: 5' 6 (1.676 m)   Body mass index is 22.27 kg/m.  Physical Exam General: no acute distress Cardiac: hemodynamically stable Pulm: normal work of breathing Neuro: alert, no focal deficit Vascular:   Palpable thrill and palpable radial on Left   Data: Dialysis access duplex +--------------------+----------+-----------------+--------+  AVG                PSV (cm/s)Flow Vol (mL/min)Describe  +--------------------+----------+-----------------+--------+  Native artery inflow   160           610                 +--------------------+----------+-----------------+--------+  Arterial anastomosis   279                               +--------------------+----------+-----------------+--------+  Prox  graft             354                               +--------------------+----------+-----------------+--------+  Mid graft               77                               +--------------------+----------+-----------------+--------+  Distal graft            60                               +--------------------+----------+-----------------+--------+  Venous anastomosis      77                               +--------------------+----------+-----------------+--------+  Venous outflow          48                               +--------------------+----------+-----------------+--------+      Assessment/Plan:     [name] [name] is a 73 y.o. female with a history of ESRD on HD who underwent left brachial axillary AV graft creation about 2 weeks ago.  I explained that the pins and needle pain in her fourth and fifth finger are most likely related to some mild nerve irritation that may improve over time. Most importantly we discussed the results of the duplex which demonstrated low velocities in the mid graft through to the venous outflow This could be due to a venous outflow stenosis and warrants a fistulogram.  Risks and benefits of fistulogram were reviewed, patient expressed understanding and elected to proceed.   Our office will call to schedule, tentatively plan for Monday, 08/16/2023 or 08/23/2023    Recommendations to optimize cardiovascular risk: Abstinence from all tobacco products. Blood glucose control with goal A1c < 7%. Blood pressure control with goal blood pressure < 140/90 mmHg. Lipid reduction therapy with goal LDL-C <100 mg/dL  Aspirin 81mg  PO QD.  Atorvastatin  40-80mg  PO QD (or other high intensity statin therapy).     Norman GORMAN Serve MD [facility]

## 2023-08-11 NOTE — Progress Notes (Signed)
 Patient ID: [name] [name], female   DOB: 07-01-1951, 73 y.o.   MRN: [id number]  Reason for Consult: Post-op Problem   Referred by Madelon Donald HERO, DO  Subjective:     HPI  [name] [name] is a 73 y.o. female who presents for follow-up after left brachial axillary AV graft creation.  Since the surgery she has had some pins-and-needles pain from her arm pit into her fourth and fifth fingers.  She denies severe pain in the hand or weakness.  She reports she has normal function.  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   Hyperlipidemia    Hypertension    Hypertensive retinopathy    OU   Osteopenia    Panic attacks    Reflux esophagitis    Seizure (HCC)    hx of   Tobacco abuse    Vitamin D  deficiency    Family History  Problem Relation Age of Onset   Heart disease Mother    Other Mother        Covid   Glaucoma Mother    Stroke Father    Other Sister        AIDS   Past Surgical History:  Procedure Laterality Date   AV FISTULA PLACEMENT Right 04/28/2021   Procedure: RIGHT ARTERIOVENOUS (AV) GRAFT INSERTION USING 4-7MM x 45CM GORE-TEX GRAFT;  Surgeon: Eliza Lonni RAMAN, MD;  Location: [facility] OR;  Service: Vascular;  Laterality: Right;   AV FISTULA PLACEMENT Left 07/28/2023   Procedure: LEFT ARM ARTERIOVENOUS (AV) GRAFT USING CORE-TEX STRETCH VASCULAR;  Surgeon: Pearline Norman RAMAN, MD;  Location: [facility] OR;  Service: Vascular;  Laterality: Left;   CATARACT EXTRACTION Bilateral    Dr. Lamar Gaudy    CATARACT EXTRACTION W/ INTRAOCULAR LENS  IMPLANT, BILATERAL  ~ 2000   CHOLECYSTECTOMY  01/28/2005   COLOSTOMY  03/1996   diverting   ECTOPIC PREGNANCY SURGERY  ?1980's   left   EYE SURGERY Bilateral    Cat Sx - Dr. Lamar Gaudy   ILEOSTOMY  ?  2002   IR FLUORO GUIDE CV LINE RIGHT  04/25/2021   IR US  GUIDE VASC ACCESS RIGHT  04/25/2021   NECK SURGERY  2020   pinched nerve    Short Social History:  Social History   Tobacco Use   Smoking status: Every Day    Current packs/day: 0.12    Average packs/day: 0.1 packs/day for 30.0 years (3.6 ttl pk-yrs)    Types: Cigarettes   Smokeless tobacco: Never   Tobacco comments:    1 pack lasts almost 2 weeks per pt  Substance Use Topics   Alcohol use: Not Currently    Comment: 05/30/2012 used to drink back in the day; last alcohol 23 yr ago    Allergies  Allergen Reactions   Amoxicillin Anaphylaxis, Rash and Other (See Comments)    Throat Swelling   Aspirin Anaphylaxis, Itching, Rash and  Other (See Comments)   Ciprofloxacin  Swelling    Possible reaction to cipro  or clindamycin  04/29/14 Possible reaction to cipro  or clindamycin  04/29/14   Morphine  And Codeine Anaphylaxis   Penicillins Anaphylaxis, Rash and Other (See Comments)    Throat Swelling   Gabapentin  Other (See Comments)    Patient had one time seizure shortly after stopping gabapentin    Ace Inhibitors Other (See Comments) and Cough    ACE possibly associated with cough and switched to ARB.  Would be OK to re-challenge if needed.    Current Outpatient Medications  Medication Sig Dispense Refill   Ascorbic Acid  500 MG CAPS Take 500 mg by mouth daily.     atorvastatin  (LIPITOR) 40 MG tablet Take 1 tablet (40 mg total) by mouth daily. 90 tablet 3   citalopram  (CELEXA ) 10 MG tablet Take 1 tablet (10 mg total) by mouth daily. 90 tablet 3   diclofenac  Sodium (VOLTAREN ) 1 % GEL APPLY TWO GRAMS TOPICAALY FOUR TIMES DAILY (Patient taking differently: Apply 2 g topically daily as  needed (for pain).) 100 g 0   Glycerin (CLEAR EYES ADV DRY & ITCHY RLF) 0.25 % SOLN Place 1 drop into both eyes daily as needed (Itching).     lidocaine -prilocaine  (EMLA ) cream Apply 1 Application topically Every Tuesday,Thursday,and Saturday with dialysis.     LORazepam  (ATIVAN ) 0.5 MG tablet Take 1 tablet (0.5 mg total) by mouth 2 (two) times daily as needed. for anxiety 60 tablet 0   Mesalamine  800 MG TBEC Take 1 tablet (800 mg total) by mouth 3 (three) times daily. 270 tablet 2   midodrine  (PROAMATINE ) 10 MG tablet Take 10 mg by mouth Every Tuesday,Thursday,and Saturday with dialysis.     mirtazapine  (REMERON ) 15 MG tablet TAKE ONE TABLET BY MOUTH AT BEDTIME 90 tablet 3   pregabalin  (LYRICA ) 25 MG capsule Take 1 capsule (25 mg total) by mouth 2 (two) times daily. 180 capsule 1   triamcinolone  ointment (KENALOG ) 0.1 % Apply 1 Application topically daily as needed (Rash).     No current facility-administered medications for this visit.    REVIEW OF SYSTEMS   All other systems were reviewed and are negative     Objective:  Objective   Vitals:   08/13/23 0905  BP: 97/62  Pulse: (!) 103  Resp: 16  Temp: 97.9 F (36.6 C)  SpO2: 91%  Weight: 138 lb (62.6 kg)  Height: 5' 6 (1.676 m)   Body mass index is 22.27 kg/m.  Physical Exam General: no acute distress Cardiac: hemodynamically stable Pulm: normal work of breathing Neuro: alert, no focal deficit Vascular:   Palpable thrill and palpable radial on Left   Data: Dialysis access duplex +--------------------+----------+-----------------+--------+  AVG                PSV (cm/s)Flow Vol (mL/min)Describe  +--------------------+----------+-----------------+--------+  Native artery inflow   160           610                 +--------------------+----------+-----------------+--------+  Arterial anastomosis   279                               +--------------------+----------+-----------------+--------+  Prox  graft             354                               +--------------------+----------+-----------------+--------+  Mid graft               77                               +--------------------+----------+-----------------+--------+  Distal graft            60                               +--------------------+----------+-----------------+--------+  Venous anastomosis      77                               +--------------------+----------+-----------------+--------+  Venous outflow          48                               +--------------------+----------+-----------------+--------+      Assessment/Plan:     [name] [name] is a 73 y.o. female with a history of ESRD on HD who underwent left brachial axillary AV graft creation about 2 weeks ago.  I explained that the pins and needle pain in her fourth and fifth finger are most likely related to some mild nerve irritation that may improve over time. Most importantly we discussed the results of the duplex which demonstrated low velocities in the mid graft through to the venous outflow This could be due to a venous outflow stenosis and warrants a fistulogram.  Risks and benefits of fistulogram were reviewed, patient expressed understanding and elected to proceed.   Our office will call to schedule, tentatively plan for Monday, 08/16/2023 or 08/23/2023    Recommendations to optimize cardiovascular risk: Abstinence from all tobacco products. Blood glucose control with goal A1c < 7%. Blood pressure control with goal blood pressure < 140/90 mmHg. Lipid reduction therapy with goal LDL-C <100 mg/dL  Aspirin 81mg  PO QD.  Atorvastatin  40-80mg  PO QD (or other high intensity statin therapy).     Norman GORMAN Serve MD [facility]

## 2023-08-12 DIAGNOSIS — D689 Coagulation defect, unspecified: Secondary | ICD-10-CM | POA: Diagnosis not present

## 2023-08-12 DIAGNOSIS — Z992 Dependence on renal dialysis: Secondary | ICD-10-CM | POA: Diagnosis not present

## 2023-08-12 DIAGNOSIS — D688 Other specified coagulation defects: Secondary | ICD-10-CM | POA: Diagnosis not present

## 2023-08-12 DIAGNOSIS — D631 Anemia in chronic kidney disease: Secondary | ICD-10-CM | POA: Diagnosis not present

## 2023-08-12 DIAGNOSIS — N2581 Secondary hyperparathyroidism of renal origin: Secondary | ICD-10-CM | POA: Diagnosis not present

## 2023-08-12 DIAGNOSIS — E8770 Fluid overload, unspecified: Secondary | ICD-10-CM | POA: Diagnosis not present

## 2023-08-12 DIAGNOSIS — R52 Pain, unspecified: Secondary | ICD-10-CM | POA: Diagnosis not present

## 2023-08-12 DIAGNOSIS — T8249XA Other complication of vascular dialysis catheter, initial encounter: Secondary | ICD-10-CM | POA: Diagnosis not present

## 2023-08-12 DIAGNOSIS — N186 End stage renal disease: Secondary | ICD-10-CM | POA: Diagnosis not present

## 2023-08-13 ENCOUNTER — Encounter: Payer: Self-pay | Admitting: Vascular Surgery

## 2023-08-13 ENCOUNTER — Ambulatory Visit (INDEPENDENT_AMBULATORY_CARE_PROVIDER_SITE_OTHER): Payer: 59 | Admitting: Vascular Surgery

## 2023-08-13 VITALS — BP 97/62 | HR 103 | Temp 97.9°F | Resp 16 | Ht 66.0 in | Wt 138.0 lb

## 2023-08-13 DIAGNOSIS — N186 End stage renal disease: Secondary | ICD-10-CM

## 2023-08-14 DIAGNOSIS — N186 End stage renal disease: Secondary | ICD-10-CM | POA: Diagnosis not present

## 2023-08-14 DIAGNOSIS — T8249XA Other complication of vascular dialysis catheter, initial encounter: Secondary | ICD-10-CM | POA: Diagnosis not present

## 2023-08-14 DIAGNOSIS — D689 Coagulation defect, unspecified: Secondary | ICD-10-CM | POA: Diagnosis not present

## 2023-08-14 DIAGNOSIS — R52 Pain, unspecified: Secondary | ICD-10-CM | POA: Diagnosis not present

## 2023-08-14 DIAGNOSIS — E8770 Fluid overload, unspecified: Secondary | ICD-10-CM | POA: Diagnosis not present

## 2023-08-14 DIAGNOSIS — D631 Anemia in chronic kidney disease: Secondary | ICD-10-CM | POA: Diagnosis not present

## 2023-08-14 DIAGNOSIS — D688 Other specified coagulation defects: Secondary | ICD-10-CM | POA: Diagnosis not present

## 2023-08-14 DIAGNOSIS — Z992 Dependence on renal dialysis: Secondary | ICD-10-CM | POA: Diagnosis not present

## 2023-08-14 DIAGNOSIS — N2581 Secondary hyperparathyroidism of renal origin: Secondary | ICD-10-CM | POA: Diagnosis not present

## 2023-08-16 ENCOUNTER — Ambulatory Visit
Admission: RE | Admit: 2023-08-16 | Discharge: 2023-08-16 | Disposition: A | Discharge status: Home / Self Care | Payer: 59 | Source: Ambulatory Visit | Attending: Gastroenterology

## 2023-08-16 DIAGNOSIS — K769 Liver disease, unspecified: Secondary | ICD-10-CM | POA: Diagnosis not present

## 2023-08-16 DIAGNOSIS — R7989 Other specified abnormal findings of blood chemistry: Secondary | ICD-10-CM

## 2023-08-17 DIAGNOSIS — N186 End stage renal disease: Secondary | ICD-10-CM | POA: Diagnosis not present

## 2023-08-17 DIAGNOSIS — E8770 Fluid overload, unspecified: Secondary | ICD-10-CM | POA: Diagnosis not present

## 2023-08-17 DIAGNOSIS — T8249XA Other complication of vascular dialysis catheter, initial encounter: Secondary | ICD-10-CM | POA: Diagnosis not present

## 2023-08-17 DIAGNOSIS — D689 Coagulation defect, unspecified: Secondary | ICD-10-CM | POA: Diagnosis not present

## 2023-08-17 DIAGNOSIS — R52 Pain, unspecified: Secondary | ICD-10-CM | POA: Diagnosis not present

## 2023-08-17 DIAGNOSIS — D688 Other specified coagulation defects: Secondary | ICD-10-CM | POA: Diagnosis not present

## 2023-08-17 DIAGNOSIS — Z992 Dependence on renal dialysis: Secondary | ICD-10-CM | POA: Diagnosis not present

## 2023-08-17 DIAGNOSIS — N2581 Secondary hyperparathyroidism of renal origin: Secondary | ICD-10-CM | POA: Diagnosis not present

## 2023-08-17 DIAGNOSIS — D631 Anemia in chronic kidney disease: Secondary | ICD-10-CM | POA: Diagnosis not present

## 2023-08-19 DIAGNOSIS — E8770 Fluid overload, unspecified: Secondary | ICD-10-CM | POA: Diagnosis not present

## 2023-08-19 DIAGNOSIS — D688 Other specified coagulation defects: Secondary | ICD-10-CM | POA: Diagnosis not present

## 2023-08-19 DIAGNOSIS — D631 Anemia in chronic kidney disease: Secondary | ICD-10-CM | POA: Diagnosis not present

## 2023-08-19 DIAGNOSIS — N186 End stage renal disease: Secondary | ICD-10-CM | POA: Diagnosis not present

## 2023-08-19 DIAGNOSIS — R52 Pain, unspecified: Secondary | ICD-10-CM | POA: Diagnosis not present

## 2023-08-19 DIAGNOSIS — Z992 Dependence on renal dialysis: Secondary | ICD-10-CM | POA: Diagnosis not present

## 2023-08-19 DIAGNOSIS — N2581 Secondary hyperparathyroidism of renal origin: Secondary | ICD-10-CM | POA: Diagnosis not present

## 2023-08-19 DIAGNOSIS — D689 Coagulation defect, unspecified: Secondary | ICD-10-CM | POA: Diagnosis not present

## 2023-08-19 DIAGNOSIS — T8249XA Other complication of vascular dialysis catheter, initial encounter: Secondary | ICD-10-CM | POA: Diagnosis not present

## 2023-08-21 DIAGNOSIS — D688 Other specified coagulation defects: Secondary | ICD-10-CM | POA: Diagnosis not present

## 2023-08-21 DIAGNOSIS — D631 Anemia in chronic kidney disease: Secondary | ICD-10-CM | POA: Diagnosis not present

## 2023-08-21 DIAGNOSIS — R52 Pain, unspecified: Secondary | ICD-10-CM | POA: Diagnosis not present

## 2023-08-21 DIAGNOSIS — Z992 Dependence on renal dialysis: Secondary | ICD-10-CM | POA: Diagnosis not present

## 2023-08-21 DIAGNOSIS — N2581 Secondary hyperparathyroidism of renal origin: Secondary | ICD-10-CM | POA: Diagnosis not present

## 2023-08-21 DIAGNOSIS — D689 Coagulation defect, unspecified: Secondary | ICD-10-CM | POA: Diagnosis not present

## 2023-08-21 DIAGNOSIS — N186 End stage renal disease: Secondary | ICD-10-CM | POA: Diagnosis not present

## 2023-08-21 DIAGNOSIS — T8249XA Other complication of vascular dialysis catheter, initial encounter: Secondary | ICD-10-CM | POA: Diagnosis not present

## 2023-08-21 DIAGNOSIS — E8770 Fluid overload, unspecified: Secondary | ICD-10-CM | POA: Diagnosis not present

## 2023-08-23 ENCOUNTER — Encounter (HOSPITAL_COMMUNITY)
Admission: RE | Disposition: A | Discharge status: Home / Self Care | Payer: Self-pay | Source: Home / Self Care | Attending: Vascular Surgery

## 2023-08-23 ENCOUNTER — Encounter (HOSPITAL_COMMUNITY): Payer: Self-pay | Admitting: Vascular Surgery

## 2023-08-23 ENCOUNTER — Ambulatory Visit (HOSPITAL_COMMUNITY)
Admission: RE | Admit: 2023-08-23 | Discharge: 2023-08-23 | Disposition: A | Discharge status: Home / Self Care | Payer: 59 | Attending: Vascular Surgery | Admitting: Vascular Surgery

## 2023-08-23 DIAGNOSIS — F17210 Nicotine dependence, cigarettes, uncomplicated: Secondary | ICD-10-CM | POA: Insufficient documentation

## 2023-08-23 DIAGNOSIS — I132 Hypertensive heart and chronic kidney disease with heart failure and with stage 5 chronic kidney disease, or end stage renal disease: Secondary | ICD-10-CM | POA: Diagnosis not present

## 2023-08-23 DIAGNOSIS — N186 End stage renal disease: Secondary | ICD-10-CM | POA: Diagnosis not present

## 2023-08-23 DIAGNOSIS — T82858A Stenosis of vascular prosthetic devices, implants and grafts, initial encounter: Secondary | ICD-10-CM | POA: Insufficient documentation

## 2023-08-23 DIAGNOSIS — I509 Heart failure, unspecified: Secondary | ICD-10-CM | POA: Diagnosis not present

## 2023-08-23 DIAGNOSIS — T82898A Other specified complication of vascular prosthetic devices, implants and grafts, initial encounter: Secondary | ICD-10-CM

## 2023-08-23 DIAGNOSIS — T82868A Thrombosis of vascular prosthetic devices, implants and grafts, initial encounter: Secondary | ICD-10-CM | POA: Insufficient documentation

## 2023-08-23 DIAGNOSIS — Z79899 Other long term (current) drug therapy: Secondary | ICD-10-CM | POA: Diagnosis not present

## 2023-08-23 DIAGNOSIS — Z7982 Long term (current) use of aspirin: Secondary | ICD-10-CM | POA: Diagnosis not present

## 2023-08-23 DIAGNOSIS — Z992 Dependence on renal dialysis: Secondary | ICD-10-CM | POA: Diagnosis not present

## 2023-08-23 DIAGNOSIS — Y832 Surgical operation with anastomosis, bypass or graft as the cause of abnormal reaction of the patient, or of later complication, without mention of misadventure at the time of the procedure: Secondary | ICD-10-CM | POA: Diagnosis not present

## 2023-08-23 HISTORY — PX: PERIPHERAL VASCULAR INTERVENTION: CATH118257

## 2023-08-23 HISTORY — PX: A/V FISTULAGRAM: CATH118298

## 2023-08-23 HISTORY — PX: PERIPHERAL VASCULAR BALLOON ANGIOPLASTY: CATH118281

## 2023-08-23 HISTORY — PX: PERIPHERAL VASCULAR THROMBECTOMY: CATH118306

## 2023-08-23 LAB — POCT I-STAT, CHEM 8
BUN: 38 mg/dL — ABNORMAL HIGH (ref 8–23)
Calcium, Ion: 1.05 mmol/L — ABNORMAL LOW (ref 1.15–1.40)
Chloride: 109 mmol/L (ref 98–111)
Creatinine, Ser: 10 mg/dL — ABNORMAL HIGH (ref 0.44–1.00)
Glucose, Bld: 85 mg/dL (ref 70–99)
HCT: 35 % — ABNORMAL LOW (ref 36.0–46.0)
Hemoglobin: 11.9 g/dL — ABNORMAL LOW (ref 12.0–15.0)
Potassium: 4.8 mmol/L (ref 3.5–5.1)
Sodium: 139 mmol/L (ref 135–145)
TCO2: 20 mmol/L — ABNORMAL LOW (ref 22–32)

## 2023-08-23 SURGERY — A/V FISTULAGRAM
Anesthesia: LOCAL | Laterality: Left

## 2023-08-23 MED ORDER — FENTANYL CITRATE (PF) 100 MCG/2ML IJ SOLN
INTRAMUSCULAR | Status: AC
  Filled 2023-08-23: qty 2

## 2023-08-23 MED ORDER — SODIUM CHLORIDE 0.9 % IV SOLN: 250.0000 mL | INTRAVENOUS | Status: DC | PRN

## 2023-08-23 MED ORDER — NITROGLYCERIN 1 MG/10 ML FOR IR/CATH LAB
INTRA_ARTERIAL | Status: DC | PRN
  Administered 2023-08-23: 300 ug

## 2023-08-23 MED ORDER — SODIUM CHLORIDE 0.9% FLUSH: 3.0000 mL | Freq: Two times a day (BID) | INTRAVENOUS | Status: DC

## 2023-08-23 MED ORDER — FENTANYL CITRATE (PF) 100 MCG/2ML IJ SOLN
INTRAMUSCULAR | Status: DC | PRN
  Administered 2023-08-23: 50 ug via INTRAVENOUS
  Administered 2023-08-23 (×3): 25 ug via INTRAVENOUS

## 2023-08-23 MED ORDER — HEPARIN SODIUM (PORCINE) 1000 UNIT/ML IJ SOLN
INTRAMUSCULAR | Status: DC | PRN
  Administered 2023-08-23: 5000 [IU] via INTRAVENOUS
  Administered 2023-08-23: 2000 [IU] via INTRAVENOUS

## 2023-08-23 MED ORDER — HEPARIN SODIUM (PORCINE) 1000 UNIT/ML IJ SOLN
INTRAMUSCULAR | Status: AC
  Filled 2023-08-23: qty 10

## 2023-08-23 MED ORDER — MIDAZOLAM HCL 2 MG/2ML IJ SOLN
INTRAMUSCULAR | Status: AC
  Filled 2023-08-23: qty 2

## 2023-08-23 MED ORDER — NITROGLYCERIN 1 MG/10 ML FOR IR/CATH LAB
INTRA_ARTERIAL | Status: AC
  Filled 2023-08-23: qty 10

## 2023-08-23 MED ORDER — HEPARIN (PORCINE) IN NACL 1000-0.9 UT/500ML-% IV SOLN
INTRAVENOUS | Status: DC | PRN
  Administered 2023-08-23: 500 mL

## 2023-08-23 MED ORDER — LIDOCAINE HCL (PF) 1 % IJ SOLN
INTRAMUSCULAR | Status: DC | PRN
  Administered 2023-08-23 (×2): 10 mL

## 2023-08-23 MED ORDER — LIDOCAINE HCL (PF) 1 % IJ SOLN
INTRAMUSCULAR | Status: AC
  Filled 2023-08-23: qty 30

## 2023-08-23 MED ORDER — IODIXANOL 320 MG/ML IV SOLN
INTRAVENOUS | Status: DC | PRN
  Administered 2023-08-23: 85 mL

## 2023-08-23 MED ORDER — SODIUM CHLORIDE 0.9% FLUSH: 3.0000 mL | INTRAVENOUS | Status: DC | PRN

## 2023-08-23 MED ORDER — MIDAZOLAM HCL 2 MG/2ML IJ SOLN
INTRAMUSCULAR | Status: DC | PRN
  Administered 2023-08-23: 1 mg via INTRAVENOUS

## 2023-08-23 SURGICAL SUPPLY — 26 items
BALLN MUSTANG 5.0X20 75 (BALLOONS) ×1 IMPLANT
BALLN MUSTANG 8X60X75 (BALLOONS) ×1 IMPLANT
BALLOON MUSTANG 5.0X20 75 (BALLOONS) IMPLANT
BALLOON MUSTANG 8X60X75 (BALLOONS) IMPLANT
CANISTER PENUMBRA ENGINE (MISCELLANEOUS) IMPLANT
CATH ANGIO 5F BER2 65CM (CATHETERS) IMPLANT
CATH INDIGO D 50CM (CATHETERS) IMPLANT
CATH QUICKCROSS SUPP .035X90CM (MICROCATHETER) IMPLANT
COVER DOME SNAP 22 D (MISCELLANEOUS) ×2 IMPLANT
DEVICE TORQUE .025-.038 (MISCELLANEOUS) IMPLANT
DEVICE TORQUE SEADRAGON GRN (MISCELLANEOUS) IMPLANT
GLIDEWIRE ADV .035X180CM (WIRE) IMPLANT
HEMOSTAT KELLY 5.5 SS STRL (MISCELLANEOUS) IMPLANT
KIT ENCORE 26 ADVANTAGE (KITS) IMPLANT
KIT MICROPUNCTURE NIT STIFF (SHEATH) IMPLANT
PROTECTION STATION PRESSURIZED (MISCELLANEOUS) ×1 IMPLANT
SCISSORS IRIS CRV SATIN 4.5 (INSTRUMENTS) IMPLANT
SHEATH PINNACLE 8F 10CM (SHEATH) IMPLANT
SHEATH PINNACLE R/O II 7F 4CM (SHEATH) IMPLANT
SHEATH PROBE COVER 6X72 (BAG) ×2 IMPLANT
STATION PROTECTION PRESSURIZED (MISCELLANEOUS) ×2 IMPLANT
STENT VIABAHN 8X50X75 (Permanent Stent) IMPLANT
STOPCOCK MORSE 400PSI 3WAY (MISCELLANEOUS) ×2 IMPLANT
TRAY PV CATH (CUSTOM PROCEDURE TRAY) ×2 IMPLANT
TUBING CIL FLEX 10 FLL-RA (TUBING) ×2 IMPLANT
WIRE BENTSON .035X145CM (WIRE) IMPLANT

## 2023-08-23 NOTE — Op Note (Signed)
Patient name: [name] [name] MRN: [id number] DOB: 04-Sep-1950 Sex: female  08/23/2023 Pre-operative Diagnosis: ESRD with threatened AVG by duplex criteria Post-operative diagnosis:  Same Surgeon:  [name], MD Procedure Performed:  Ultrasound-guided access of the left arm AVG toward venous anastomosis Ultrasound-guided access of the left arm AVG towards arterial anastomosis AVG fistulogram and central venogram Mechanical thrombectomy of left arm AVG and venous outflow with 8 French penumbra Balloon angioplasty of left arm AVG and venous anastomosis with 8 mm x 60 mm Mustang Stenting of venous anastomosis with 8 mm x 5 cm Viabahn Cannulation of left brachial artery Mechanical thrombectomy of arterial anastomosis with 5 mm Mustang, Fogarty technique Balloon angioplasty of arterial anastomosis with 5 mm balloon Cannulation of left radial artery Mechanical thrombectomy of left brachial artery with 8 French penumbra 300 mcg nitroglycerin administration into left brachial artery Monocryl suture closure of both AVG access sites   Indications: Ms. Rayborn is a 73-year-old female with ESRD on HD who recently underwent a left arm brachial to axillary AVG creation on 120 08/2023.  She was seen in follow-up and duplex noted decreased flow velocities in the venous end of the AVG.  Risks and benefits of fistulogram with intervention were reviewed, patient expressed understanding and elected to proceed.  Findings:  Thrombosed left arm AVG with minimal flow through the graft and complete thrombosis of the arterial anastomosis Severe stenosis of venous anastomosis Widely patent central system   Procedure:  The patient was identified in the holding area and taken to the cath lab  The patient was then placed supine on the table and prepped and draped in the usual sterile fashion.  A time out was called.  Ultrasound was used to evaluate the left arm AV access. This was accessed under u/s guidance. An 018  wire was advanced without resistance, a micropuncture sheath was placed and fistulagram obtained which demonstrated the above findings.  A glide advantage wire was placed through the graft and into the central system.  This was then upsized to an 8 [place] sheath.  The patient was then systemically heparinized and an 8 [place] penumbra was run through the AVG and into the venous outflow tract with resolution of fresh thrombus.  Fistulogram then demonstrated a severe stenosis at the venous anastomosis.  This was treated with an 8 x 60 Mustang balloon.  Angiography then demonstrated an appropriate response but with extravasation.  The balloon was then reinflated and held up for 3 minutes.  There continues to be extravasation on repeat contrast injection so an 8 x 5 cm Viabahn was used to stent across this area.  Attention was then turned to opening the arterial aspect of the graft.  The graft was accessed under ultrasound guidance towards the arterial anastomosis approximately 5 cm of the arm from the previous access site.  The glide advantage wire was placed through the micro sheath and was easily tracked into the proximal brachial artery.  This was then upsized to a 7 [place] short sheath.  I then performed over-the-wire Fogarty of the proximal anastomosis and arterial aspect of the graft with a 5 mm Mustang balloon for 2 passes.  There was improved flow although there was still some residual stenosis of the arterial anastomosis.  So this was treated with the same 5 mm Mustang balloon and the area was angioplastied for 2 minutes.  The arterial portion of the graft was then widely patent although the venous end of the graft appeared to have  residual clot.  This was treated again with the 8 mm x 60 mm Mustang balloon.  Fistulogram then demonstrated that the graft, stent and venous outflow were then noted to be widely patent and there was pulsatile flow when opening the valve of the sheath.  Upon review of the pictures  there appeared to be residual clot at the arterial anastomosis that was protruding into the brachial artery causing an approximate 70% stenosis.  For this reason I elected to attempt mechanical thrombectomy of this area to prevent risk of future distal embolization.  The glide advantage wire was slightly pulled back and directed distally into the arm.  The 7 French short sheath was then upsized to an 8 [place] sheath and the 8 [place] penumbra was passed twice into this area.  An angiogram demonstrated resolution of the clot but with poor flow into the radial artery.  For this reason the radial artery was cannulated with a glide advantage wire and a quick cross catheter was placed into the proximal radial artery.  Angiography demonstrated wide patency down to the hand with retrograde filling of the brachial artery and into the ulnar artery.  There appeared to be spasm of the proximal radial artery likely due to a wire or the 8 French penumbra catheter.  The quick cross catheter was pulled back into the brachial artery and 300 mcg of nitro were given intra-arterially to the left arm.  After about 1 minute an angiogram was obtained which demonstrated wide patency of the radial and ulnar arteries with flow into the hand.  All wires were removed and access sites were closed with a figure-of-eight Monocryl suture with removal of the sheaths. There was a small golf ball sized hematoma in the upper arm and for that reason the arm was wrapped with an Ace wrap.  Impression: Successful declot of the left arm AVG with stenting of the venous anastomosis. Plan of 2-week follow-up with repeat fistula duplex in the office and at that time we will determine if the graft is appropriate for use   [name] MD [facility] Office: [phone]

## 2023-08-23 NOTE — Interval H&P Note (Signed)
History and Physical Interval Note:  08/23/2023 7:46 AM  [name] [name]  has presented today for surgery, with the diagnosis of end stage renal disease.  The various methods of treatment have been discussed with the patient and family. After consideration of risks, benefits and other options for treatment, the patient has consented to  Procedure(s): A/V Fistulagram (N/A) as a surgical intervention.  The patient's history has been reviewed, patient examined, no change in status, stable for surgery.  I have reviewed the patient's chart and labs.  Questions were answered to the patient's satisfaction.     [name]

## 2023-08-23 NOTE — Progress Notes (Signed)
 Patient was given discharge instructions. She verbalized understanding.

## 2023-08-24 ENCOUNTER — Ambulatory Visit (INDEPENDENT_AMBULATORY_CARE_PROVIDER_SITE_OTHER): Payer: 59 | Admitting: Physician Assistant

## 2023-08-24 ENCOUNTER — Telehealth: Payer: Self-pay

## 2023-08-24 VITALS — BP 91/50 | HR 108 | Temp 98.1°F | Resp 18 | Ht 66.0 in | Wt 139.7 lb

## 2023-08-24 DIAGNOSIS — R52 Pain, unspecified: Secondary | ICD-10-CM | POA: Diagnosis not present

## 2023-08-24 DIAGNOSIS — D688 Other specified coagulation defects: Secondary | ICD-10-CM | POA: Diagnosis not present

## 2023-08-24 DIAGNOSIS — D631 Anemia in chronic kidney disease: Secondary | ICD-10-CM | POA: Diagnosis not present

## 2023-08-24 DIAGNOSIS — N186 End stage renal disease: Secondary | ICD-10-CM

## 2023-08-24 DIAGNOSIS — E8770 Fluid overload, unspecified: Secondary | ICD-10-CM | POA: Diagnosis not present

## 2023-08-24 DIAGNOSIS — Z992 Dependence on renal dialysis: Secondary | ICD-10-CM | POA: Diagnosis not present

## 2023-08-24 DIAGNOSIS — D689 Coagulation defect, unspecified: Secondary | ICD-10-CM | POA: Diagnosis not present

## 2023-08-24 DIAGNOSIS — N2581 Secondary hyperparathyroidism of renal origin: Secondary | ICD-10-CM | POA: Diagnosis not present

## 2023-08-24 DIAGNOSIS — T8249XA Other complication of vascular dialysis catheter, initial encounter: Secondary | ICD-10-CM | POA: Diagnosis not present

## 2023-08-24 MED ORDER — HYDROCODONE-ACETAMINOPHEN 5-325 MG PO TABS: 1.0000 | ORAL_TABLET | Freq: Four times a day (QID) | ORAL | 0 refills | Status: DC | PRN

## 2023-08-24 NOTE — Progress Notes (Signed)
POST OPERATIVE OFFICE NOTE    CC:  F/u for surgery  HPI:  This is a 73 y.o. female who is s/p Fistulogram with  on 08/23/23 by Dr. Hetty Blend.    Pt returns today for follow up.  Pt states the ace may be too tight, she is in a lot of pain.   Sensation intact, her harm feels very heavy.  The pain is worse at the top of the arm near her arm pit.     Allergies  Allergen Reactions   Amoxicillin Anaphylaxis, Rash and Other (See Comments)    Throat Swelling   Aspirin Anaphylaxis, Itching, Rash and Other (See Comments)   Ciprofloxacin Swelling    Possible reaction to cipro or clindamycin 04/29/14 Possible reaction to cipro or clindamycin 04/29/14   Morphine And Codeine Anaphylaxis   Penicillins Anaphylaxis, Rash and Other (See Comments)    Throat Swelling   Gabapentin Other (See Comments)    Patient had one time seizure shortly after stopping gabapentin   Ace Inhibitors Other (See Comments) and Cough    ACE possibly associated with cough and switched to ARB.  Would be OK to re-challenge if needed.    Current Outpatient Medications  Medication Sig Dispense Refill   acetaminophen (TYLENOL) 500 MG tablet Take 1,000 mg by mouth every 6 (six) hours as needed for mild pain (pain score 1-3) or moderate pain (pain score 4-6).     Ascorbic Acid 500 MG CAPS Take 500 mg by mouth daily.     atorvastatin (LIPITOR) 40 MG tablet Take 1 tablet (40 mg total) by mouth daily. 90 tablet 3   citalopram (CELEXA) 10 MG tablet Take 1 tablet (10 mg total) by mouth daily. 90 tablet 3   diclofenac Sodium (VOLTAREN) 1 % GEL APPLY TWO GRAMS TOPICAALY FOUR TIMES DAILY (Patient taking differently: Apply 2 g topically daily as needed (sciatica).) 100 g 0   Glycerin (CLEAR EYES ADV DRY & ITCHY RLF) 0.25 % SOLN Place 1 drop into both eyes daily as needed (Itching).     HYDROcodone-acetaminophen (NORCO/VICODIN) 5-325 MG tablet Take 1 tablet by mouth every 6 (six) hours as needed for moderate pain (pain score 4-6). 30  tablet 0   lidocaine-prilocaine (EMLA) cream Apply 1 Application topically Every Tuesday,Thursday,and Saturday with dialysis.     LORazepam (ATIVAN) 0.5 MG tablet Take 1 tablet (0.5 mg total) by mouth 2 (two) times daily as needed. for anxiety 60 tablet 0   Mesalamine 800 MG TBEC Take 1 tablet (800 mg total) by mouth 3 (three) times daily. 270 tablet 2   midodrine (PROAMATINE) 10 MG tablet Take 10 mg by mouth Every Tuesday,Thursday,and Saturday with dialysis.     mirtazapine (REMERON) 15 MG tablet TAKE ONE TABLET BY MOUTH AT BEDTIME 90 tablet 3   pregabalin (LYRICA) 25 MG capsule Take 1 capsule (25 mg total) by mouth 2 (two) times daily. 180 capsule 1   sevelamer carbonate (RENVELA) 800 MG tablet Take 800 mg by mouth 3 (three) times daily with meals.     No current facility-administered medications for this visit.     ROS:  See HPI  Physical Exam:    Incision:  skin with goo integrity  Extremities:  palpable radial pulse, grip intact and sensation slightly decreased due to swelling. Fullness volar surface of the axillary proximal arm.  Compartments are soft.      Assessment/Plan:  This is a 73 y.o. female who is s/p: Fistulogram:   -Mechanical thrombectomy of left  arm AVG and venous outflow with 8 French penumbra Balloon angioplasty of left arm AVG and venous anastomosis with 8 mm x 60 mm Mustang Stenting of venous anastomosis with 8 mm x 5 cm Viabahn Cannulation of left brachial artery Mechanical thrombectomy of arterial anastomosis with 5 mm Mustang, Fogarty technique Balloon angioplasty of arterial anastomosis with 5 mm balloon Cannulation of left radial artery Mechanical thrombectomy of left brachial artery with 8 French penumbra 300 mcg nitroglycerin administration into left brachial artery Monocryl suture closure of both AVG access sites  I re wrapped the left UE from the hand to the axilla.  She states it felt better.  I ordered Norco 5/325 for pain control q 6 PRN.   Showed her how to elevate her hand and alternate making a fist to pump the edema out.  She will f/u for suture removal in a week and a half.  If she has more concerns she will call our office.    Mosetta Pigeon PA-C [facility] [phone]   Clinic MD:  Chestine Spore

## 2023-08-24 NOTE — Telephone Encounter (Signed)
Pt called with c/o swelling in hand and arm s/p fistulogram. She is having pain from this; unable to grip a pen. Arm/hand is warm. MD aware and pt has been scheduled to see APP today.

## 2023-08-26 DIAGNOSIS — N186 End stage renal disease: Secondary | ICD-10-CM | POA: Diagnosis not present

## 2023-08-26 DIAGNOSIS — D689 Coagulation defect, unspecified: Secondary | ICD-10-CM | POA: Diagnosis not present

## 2023-08-26 DIAGNOSIS — N2581 Secondary hyperparathyroidism of renal origin: Secondary | ICD-10-CM | POA: Diagnosis not present

## 2023-08-26 DIAGNOSIS — E8770 Fluid overload, unspecified: Secondary | ICD-10-CM | POA: Diagnosis not present

## 2023-08-26 DIAGNOSIS — D631 Anemia in chronic kidney disease: Secondary | ICD-10-CM | POA: Diagnosis not present

## 2023-08-26 DIAGNOSIS — R52 Pain, unspecified: Secondary | ICD-10-CM | POA: Diagnosis not present

## 2023-08-26 DIAGNOSIS — Z992 Dependence on renal dialysis: Secondary | ICD-10-CM | POA: Diagnosis not present

## 2023-08-26 DIAGNOSIS — D688 Other specified coagulation defects: Secondary | ICD-10-CM | POA: Diagnosis not present

## 2023-08-26 DIAGNOSIS — T8249XA Other complication of vascular dialysis catheter, initial encounter: Secondary | ICD-10-CM | POA: Diagnosis not present

## 2023-08-28 DIAGNOSIS — E8770 Fluid overload, unspecified: Secondary | ICD-10-CM | POA: Diagnosis not present

## 2023-08-28 DIAGNOSIS — D689 Coagulation defect, unspecified: Secondary | ICD-10-CM | POA: Diagnosis not present

## 2023-08-28 DIAGNOSIS — Z992 Dependence on renal dialysis: Secondary | ICD-10-CM | POA: Diagnosis not present

## 2023-08-28 DIAGNOSIS — D688 Other specified coagulation defects: Secondary | ICD-10-CM | POA: Diagnosis not present

## 2023-08-28 DIAGNOSIS — N2581 Secondary hyperparathyroidism of renal origin: Secondary | ICD-10-CM | POA: Diagnosis not present

## 2023-08-28 DIAGNOSIS — N186 End stage renal disease: Secondary | ICD-10-CM | POA: Diagnosis not present

## 2023-08-28 DIAGNOSIS — D631 Anemia in chronic kidney disease: Secondary | ICD-10-CM | POA: Diagnosis not present

## 2023-08-28 DIAGNOSIS — R52 Pain, unspecified: Secondary | ICD-10-CM | POA: Diagnosis not present

## 2023-08-28 DIAGNOSIS — T8249XA Other complication of vascular dialysis catheter, initial encounter: Secondary | ICD-10-CM | POA: Diagnosis not present

## 2023-08-31 DIAGNOSIS — D688 Other specified coagulation defects: Secondary | ICD-10-CM | POA: Diagnosis not present

## 2023-08-31 DIAGNOSIS — Z992 Dependence on renal dialysis: Secondary | ICD-10-CM | POA: Diagnosis not present

## 2023-08-31 DIAGNOSIS — R52 Pain, unspecified: Secondary | ICD-10-CM | POA: Diagnosis not present

## 2023-08-31 DIAGNOSIS — T8249XA Other complication of vascular dialysis catheter, initial encounter: Secondary | ICD-10-CM | POA: Diagnosis not present

## 2023-08-31 DIAGNOSIS — N186 End stage renal disease: Secondary | ICD-10-CM | POA: Diagnosis not present

## 2023-08-31 DIAGNOSIS — D689 Coagulation defect, unspecified: Secondary | ICD-10-CM | POA: Diagnosis not present

## 2023-08-31 DIAGNOSIS — E8770 Fluid overload, unspecified: Secondary | ICD-10-CM | POA: Diagnosis not present

## 2023-08-31 DIAGNOSIS — N2581 Secondary hyperparathyroidism of renal origin: Secondary | ICD-10-CM | POA: Diagnosis not present

## 2023-08-31 DIAGNOSIS — D631 Anemia in chronic kidney disease: Secondary | ICD-10-CM | POA: Diagnosis not present

## 2023-09-02 DIAGNOSIS — N186 End stage renal disease: Secondary | ICD-10-CM | POA: Diagnosis not present

## 2023-09-02 DIAGNOSIS — D688 Other specified coagulation defects: Secondary | ICD-10-CM | POA: Diagnosis not present

## 2023-09-02 DIAGNOSIS — Z992 Dependence on renal dialysis: Secondary | ICD-10-CM | POA: Diagnosis not present

## 2023-09-02 DIAGNOSIS — T8249XA Other complication of vascular dialysis catheter, initial encounter: Secondary | ICD-10-CM | POA: Diagnosis not present

## 2023-09-02 DIAGNOSIS — E8770 Fluid overload, unspecified: Secondary | ICD-10-CM | POA: Diagnosis not present

## 2023-09-02 DIAGNOSIS — R52 Pain, unspecified: Secondary | ICD-10-CM | POA: Diagnosis not present

## 2023-09-02 DIAGNOSIS — D689 Coagulation defect, unspecified: Secondary | ICD-10-CM | POA: Diagnosis not present

## 2023-09-02 DIAGNOSIS — D631 Anemia in chronic kidney disease: Secondary | ICD-10-CM | POA: Diagnosis not present

## 2023-09-02 DIAGNOSIS — N2581 Secondary hyperparathyroidism of renal origin: Secondary | ICD-10-CM | POA: Diagnosis not present

## 2023-09-03 DIAGNOSIS — I129 Hypertensive chronic kidney disease with stage 1 through stage 4 chronic kidney disease, or unspecified chronic kidney disease: Secondary | ICD-10-CM | POA: Diagnosis not present

## 2023-09-03 DIAGNOSIS — N186 End stage renal disease: Secondary | ICD-10-CM | POA: Diagnosis not present

## 2023-09-03 DIAGNOSIS — Z992 Dependence on renal dialysis: Secondary | ICD-10-CM | POA: Diagnosis not present

## 2023-09-04 DIAGNOSIS — D631 Anemia in chronic kidney disease: Secondary | ICD-10-CM | POA: Diagnosis not present

## 2023-09-04 DIAGNOSIS — R52 Pain, unspecified: Secondary | ICD-10-CM | POA: Diagnosis not present

## 2023-09-04 DIAGNOSIS — N2581 Secondary hyperparathyroidism of renal origin: Secondary | ICD-10-CM | POA: Diagnosis not present

## 2023-09-04 DIAGNOSIS — N186 End stage renal disease: Secondary | ICD-10-CM | POA: Diagnosis not present

## 2023-09-04 DIAGNOSIS — Z992 Dependence on renal dialysis: Secondary | ICD-10-CM | POA: Diagnosis not present

## 2023-09-04 DIAGNOSIS — D688 Other specified coagulation defects: Secondary | ICD-10-CM | POA: Diagnosis not present

## 2023-09-04 DIAGNOSIS — D689 Coagulation defect, unspecified: Secondary | ICD-10-CM | POA: Diagnosis not present

## 2023-09-04 DIAGNOSIS — A499 Bacterial infection, unspecified: Secondary | ICD-10-CM | POA: Diagnosis not present

## 2023-09-04 DIAGNOSIS — T8249XA Other complication of vascular dialysis catheter, initial encounter: Secondary | ICD-10-CM | POA: Diagnosis not present

## 2023-09-04 DIAGNOSIS — R7881 Bacteremia: Secondary | ICD-10-CM | POA: Diagnosis not present

## 2023-09-06 ENCOUNTER — Telehealth: Payer: Self-pay

## 2023-09-06 ENCOUNTER — Other Ambulatory Visit: Payer: Self-pay

## 2023-09-06 DIAGNOSIS — R7989 Other specified abnormal findings of blood chemistry: Secondary | ICD-10-CM | POA: Diagnosis not present

## 2023-09-06 DIAGNOSIS — Z933 Colostomy status: Secondary | ICD-10-CM | POA: Diagnosis not present

## 2023-09-06 DIAGNOSIS — Z992 Dependence on renal dialysis: Secondary | ICD-10-CM

## 2023-09-06 DIAGNOSIS — R945 Abnormal results of liver function studies: Secondary | ICD-10-CM | POA: Diagnosis not present

## 2023-09-06 DIAGNOSIS — K5090 Crohn's disease, unspecified, without complications: Secondary | ICD-10-CM | POA: Diagnosis not present

## 2023-09-06 NOTE — Telephone Encounter (Signed)
 Appointment/Triage: - pt called to make sure she was ok b/c she has not changed her bandages since she came in the office after her surgery.  -on chart review it was recommended 2 week duplex on fistula and suture removal.    -noted appt booked for 1 month f/u.  Appts changed, for 03/5 duplex and for 03/7 PA post-op follow up. OK per patient to change appts.

## 2023-09-07 ENCOUNTER — Other Ambulatory Visit: Payer: Self-pay | Admitting: *Deleted

## 2023-09-07 DIAGNOSIS — N186 End stage renal disease: Secondary | ICD-10-CM

## 2023-09-07 DIAGNOSIS — A499 Bacterial infection, unspecified: Secondary | ICD-10-CM | POA: Diagnosis not present

## 2023-09-07 DIAGNOSIS — R7881 Bacteremia: Secondary | ICD-10-CM | POA: Diagnosis not present

## 2023-09-07 DIAGNOSIS — Z992 Dependence on renal dialysis: Secondary | ICD-10-CM | POA: Diagnosis not present

## 2023-09-07 DIAGNOSIS — R52 Pain, unspecified: Secondary | ICD-10-CM | POA: Diagnosis not present

## 2023-09-07 DIAGNOSIS — N2581 Secondary hyperparathyroidism of renal origin: Secondary | ICD-10-CM | POA: Diagnosis not present

## 2023-09-07 DIAGNOSIS — D689 Coagulation defect, unspecified: Secondary | ICD-10-CM | POA: Diagnosis not present

## 2023-09-07 DIAGNOSIS — T8249XA Other complication of vascular dialysis catheter, initial encounter: Secondary | ICD-10-CM | POA: Diagnosis not present

## 2023-09-07 DIAGNOSIS — D631 Anemia in chronic kidney disease: Secondary | ICD-10-CM | POA: Diagnosis not present

## 2023-09-07 DIAGNOSIS — D688 Other specified coagulation defects: Secondary | ICD-10-CM | POA: Diagnosis not present

## 2023-09-08 ENCOUNTER — Ambulatory Visit (HOSPITAL_COMMUNITY)
Admission: RE | Admit: 2023-09-08 | Discharge: 2023-09-08 | Disposition: A | Discharge status: Home / Self Care | Source: Ambulatory Visit | Attending: Vascular Surgery | Admitting: Vascular Surgery

## 2023-09-08 DIAGNOSIS — Z992 Dependence on renal dialysis: Secondary | ICD-10-CM | POA: Insufficient documentation

## 2023-09-08 DIAGNOSIS — N186 End stage renal disease: Secondary | ICD-10-CM | POA: Diagnosis not present

## 2023-09-09 DIAGNOSIS — A499 Bacterial infection, unspecified: Secondary | ICD-10-CM | POA: Diagnosis not present

## 2023-09-09 DIAGNOSIS — N2581 Secondary hyperparathyroidism of renal origin: Secondary | ICD-10-CM | POA: Diagnosis not present

## 2023-09-09 DIAGNOSIS — N186 End stage renal disease: Secondary | ICD-10-CM | POA: Diagnosis not present

## 2023-09-09 DIAGNOSIS — D688 Other specified coagulation defects: Secondary | ICD-10-CM | POA: Diagnosis not present

## 2023-09-09 DIAGNOSIS — T8249XA Other complication of vascular dialysis catheter, initial encounter: Secondary | ICD-10-CM | POA: Diagnosis not present

## 2023-09-09 DIAGNOSIS — Z992 Dependence on renal dialysis: Secondary | ICD-10-CM | POA: Diagnosis not present

## 2023-09-09 DIAGNOSIS — D689 Coagulation defect, unspecified: Secondary | ICD-10-CM | POA: Diagnosis not present

## 2023-09-09 DIAGNOSIS — R52 Pain, unspecified: Secondary | ICD-10-CM | POA: Diagnosis not present

## 2023-09-09 DIAGNOSIS — D631 Anemia in chronic kidney disease: Secondary | ICD-10-CM | POA: Diagnosis not present

## 2023-09-09 DIAGNOSIS — R7881 Bacteremia: Secondary | ICD-10-CM | POA: Diagnosis not present

## 2023-09-10 ENCOUNTER — Ambulatory Visit (INDEPENDENT_AMBULATORY_CARE_PROVIDER_SITE_OTHER): Admitting: Physician Assistant

## 2023-09-10 VITALS — BP 81/53 | HR 104 | Temp 98.3°F | Resp 18 | Ht 66.0 in | Wt 135.2 lb

## 2023-09-10 DIAGNOSIS — N186 End stage renal disease: Secondary | ICD-10-CM

## 2023-09-10 NOTE — Progress Notes (Signed)
 POST OPERATIVE OFFICE NOTE    CC:  F/u for surgery  HPI:  This is a 73 y.o. female who is s/p fistulogram on 08/23/23 by Dr. Hetty Blend.    -Mechanical thrombectomy of left arm AVG and venous outflow with 8 French penumbra Balloon angioplasty of left arm AVG and venous anastomosis with 8 mm x 60 mm Mustang Stenting of venous anastomosis with 8 mm x 5 cm Viabahn Cannulation of left brachial artery Mechanical thrombectomy of arterial anastomosis with 5 mm Mustang, Fogarty technique Balloon angioplasty of arterial anastomosis with 5 mm balloon Cannulation of left radial artery Mechanical thrombectomy of left brachial artery with 8 French penumbra 300 mcg nitroglycerin administration into left brachial artery Monocryl suture closure of both AVG access sites  She was last seen on 08/24/23 and is here today for suture removal.  She has had a right UE AV graft by Dr. Edilia Bo back in 04/28/21.  This was her first access. She has had hypotension in the past and the right UE graft occluded.  She states she has had problems with hypotension and this is why her right and now her left AV graft has failed.  She is currently on Midodrine for BP support, this is even needed for [facility] HD.  Her BP systolic is 80's to low 90's.     Allergies  Allergen Reactions   Amoxicillin Anaphylaxis, Rash and Other (See Comments)    Throat Swelling   Aspirin Anaphylaxis, Itching, Rash and Other (See Comments)   Ciprofloxacin Swelling    Possible reaction to cipro or clindamycin 04/29/14 Possible reaction to cipro or clindamycin 04/29/14   Morphine And Codeine Anaphylaxis   Penicillins Anaphylaxis, Rash and Other (See Comments)    Throat Swelling   Gabapentin Other (See Comments)    Patient had one time seizure shortly after stopping gabapentin   Ace Inhibitors Other (See Comments) and Cough    ACE possibly associated with cough and switched to ARB.  Would be OK to re-challenge if needed.    Current Outpatient  Medications  Medication Sig Dispense Refill   acetaminophen (TYLENOL) 500 MG tablet Take 1,000 mg by mouth every 6 (six) hours as needed for mild pain (pain score 1-3) or moderate pain (pain score 4-6).     Ascorbic Acid 500 MG CAPS Take 500 mg by mouth daily.     atorvastatin (LIPITOR) 40 MG tablet Take 1 tablet (40 mg total) by mouth daily. 90 tablet 3   citalopram (CELEXA) 10 MG tablet Take 1 tablet (10 mg total) by mouth daily. 90 tablet 3   diclofenac Sodium (VOLTAREN) 1 % GEL APPLY TWO GRAMS TOPICAALY FOUR TIMES DAILY (Patient taking differently: Apply 2 g topically daily as needed (sciatica).) 100 g 0   Glycerin (CLEAR EYES ADV DRY & ITCHY RLF) 0.25 % SOLN Place 1 drop into both eyes daily as needed (Itching).     HYDROcodone-acetaminophen (NORCO/VICODIN) 5-325 MG tablet Take 1 tablet by mouth every 6 (six) hours as needed for moderate pain (pain score 4-6). 30 tablet 0   lidocaine-prilocaine (EMLA) cream Apply 1 Application topically Every Tuesday,Thursday,and Saturday with dialysis.     LORazepam (ATIVAN) 0.5 MG tablet Take 1 tablet (0.5 mg total) by mouth 2 (two) times daily as needed. for anxiety 60 tablet 0   Mesalamine 800 MG TBEC Take 1 tablet (800 mg total) by mouth 3 (three) times daily. 270 tablet 2   midodrine (PROAMATINE) 10 MG tablet Take 10 mg by mouth Every Tuesday,Thursday,and  Saturday with dialysis.     mirtazapine (REMERON) 15 MG tablet TAKE ONE TABLET BY MOUTH AT BEDTIME 90 tablet 3   pregabalin (LYRICA) 25 MG capsule Take 1 capsule (25 mg total) by mouth 2 (two) times daily. 180 capsule 1   sevelamer carbonate (RENVELA) 800 MG tablet Take 800 mg by mouth 3 (three) times daily with meals.     No current facility-administered medications for this visit.     ROS:  See HPI  Physical Exam:  +--------------------+----------+-----------------+--------+  AVG                PSV (cm/s)Flow Vol (mL/min)Describe   +--------------------+----------+-----------------+--------+  Arterial anastomosis                           occluded  +--------------------+----------+-----------------+--------+  Prox graft                                     occluded  +--------------------+----------+-----------------+--------+  Mid graft                                      occluded  +--------------------+----------+-----------------+--------+  Distal graft                                   occluded  +--------------------+----------+-----------------+--------+  Venous anastomosis                             occluded  +--------------------+----------+-----------------+--------+        Incision:  well healed, sutures removed from angio stick sites. Extremities:  no thrill in the left UE AV graft, doppler signals in the radial, ulnar and palmer arteries.   Neuro: sensation intact     Assessment/Plan:  This is a 73 y.o. female who is s/p:angiogram with thrombectomy and angioplasty of the left UE AV fistula with low flow velocities.      She has a working right internal jugular TDC and now has 2 failed UE AV grafts one on the right and one on the left due to hypotension.  I don't think at this time a graft will be fruitful so she will have to be [facility] dependent until she has stable systolic BP to support a graft.  She would have to maintain a systolic pressure above 100.  Today her pressure is 81/53.  F/U PRN   Mosetta Pigeon PA-C [facility] [phone]   Clinic MD:  Hetty Blend

## 2023-09-11 DIAGNOSIS — A499 Bacterial infection, unspecified: Secondary | ICD-10-CM | POA: Diagnosis not present

## 2023-09-11 DIAGNOSIS — D689 Coagulation defect, unspecified: Secondary | ICD-10-CM | POA: Diagnosis not present

## 2023-09-11 DIAGNOSIS — R7881 Bacteremia: Secondary | ICD-10-CM | POA: Diagnosis not present

## 2023-09-11 DIAGNOSIS — D631 Anemia in chronic kidney disease: Secondary | ICD-10-CM | POA: Diagnosis not present

## 2023-09-11 DIAGNOSIS — Z992 Dependence on renal dialysis: Secondary | ICD-10-CM | POA: Diagnosis not present

## 2023-09-11 DIAGNOSIS — R52 Pain, unspecified: Secondary | ICD-10-CM | POA: Diagnosis not present

## 2023-09-11 DIAGNOSIS — D688 Other specified coagulation defects: Secondary | ICD-10-CM | POA: Diagnosis not present

## 2023-09-11 DIAGNOSIS — T8249XA Other complication of vascular dialysis catheter, initial encounter: Secondary | ICD-10-CM | POA: Diagnosis not present

## 2023-09-11 DIAGNOSIS — N186 End stage renal disease: Secondary | ICD-10-CM | POA: Diagnosis not present

## 2023-09-11 DIAGNOSIS — N2581 Secondary hyperparathyroidism of renal origin: Secondary | ICD-10-CM | POA: Diagnosis not present

## 2023-09-14 ENCOUNTER — Other Ambulatory Visit: Payer: Self-pay | Admitting: Gastroenterology

## 2023-09-14 DIAGNOSIS — R52 Pain, unspecified: Secondary | ICD-10-CM | POA: Diagnosis not present

## 2023-09-14 DIAGNOSIS — A499 Bacterial infection, unspecified: Secondary | ICD-10-CM | POA: Diagnosis not present

## 2023-09-14 DIAGNOSIS — R7881 Bacteremia: Secondary | ICD-10-CM | POA: Diagnosis not present

## 2023-09-14 DIAGNOSIS — N186 End stage renal disease: Secondary | ICD-10-CM | POA: Diagnosis not present

## 2023-09-14 DIAGNOSIS — D631 Anemia in chronic kidney disease: Secondary | ICD-10-CM | POA: Diagnosis not present

## 2023-09-14 DIAGNOSIS — R748 Abnormal levels of other serum enzymes: Secondary | ICD-10-CM

## 2023-09-14 DIAGNOSIS — N2581 Secondary hyperparathyroidism of renal origin: Secondary | ICD-10-CM | POA: Diagnosis not present

## 2023-09-14 DIAGNOSIS — T8249XA Other complication of vascular dialysis catheter, initial encounter: Secondary | ICD-10-CM | POA: Diagnosis not present

## 2023-09-14 DIAGNOSIS — D688 Other specified coagulation defects: Secondary | ICD-10-CM | POA: Diagnosis not present

## 2023-09-14 DIAGNOSIS — D689 Coagulation defect, unspecified: Secondary | ICD-10-CM | POA: Diagnosis not present

## 2023-09-14 DIAGNOSIS — Z992 Dependence on renal dialysis: Secondary | ICD-10-CM | POA: Diagnosis not present

## 2023-09-16 DIAGNOSIS — T8249XA Other complication of vascular dialysis catheter, initial encounter: Secondary | ICD-10-CM | POA: Diagnosis not present

## 2023-09-16 DIAGNOSIS — D688 Other specified coagulation defects: Secondary | ICD-10-CM | POA: Diagnosis not present

## 2023-09-16 DIAGNOSIS — D631 Anemia in chronic kidney disease: Secondary | ICD-10-CM | POA: Diagnosis not present

## 2023-09-16 DIAGNOSIS — Z992 Dependence on renal dialysis: Secondary | ICD-10-CM | POA: Diagnosis not present

## 2023-09-16 DIAGNOSIS — R52 Pain, unspecified: Secondary | ICD-10-CM | POA: Diagnosis not present

## 2023-09-16 DIAGNOSIS — N186 End stage renal disease: Secondary | ICD-10-CM | POA: Diagnosis not present

## 2023-09-16 DIAGNOSIS — R7881 Bacteremia: Secondary | ICD-10-CM | POA: Diagnosis not present

## 2023-09-16 DIAGNOSIS — D689 Coagulation defect, unspecified: Secondary | ICD-10-CM | POA: Diagnosis not present

## 2023-09-16 DIAGNOSIS — N2581 Secondary hyperparathyroidism of renal origin: Secondary | ICD-10-CM | POA: Diagnosis not present

## 2023-09-16 DIAGNOSIS — A499 Bacterial infection, unspecified: Secondary | ICD-10-CM | POA: Diagnosis not present

## 2023-09-17 ENCOUNTER — Encounter (HOSPITAL_COMMUNITY): Payer: 59

## 2023-09-17 ENCOUNTER — Telehealth: Payer: Self-pay | Admitting: *Deleted

## 2023-09-17 ENCOUNTER — Encounter: Payer: 59 | Admitting: Vascular Surgery

## 2023-09-17 ENCOUNTER — Inpatient Hospital Stay: Admission: RE | Admit: 2023-09-17 | Source: Ambulatory Visit

## 2023-09-17 NOTE — Telephone Encounter (Signed)
 Triage called regarding fistula leaking . Pt states the arm is not hot to touch, tender in certain spots when touched. Drainage started today 09/17/23 she thinks she may have bumped it. pt said drainage was brownish but verbally no other signs or infection. Pt has dialysis tomorrow and will left dialysis look at it . Pt informed to call if any changes.

## 2023-09-17 NOTE — Telephone Encounter (Signed)
 Pt also educated on cleaning the area daily with antibacterial soap and pat dry .

## 2023-09-18 DIAGNOSIS — N186 End stage renal disease: Secondary | ICD-10-CM | POA: Diagnosis not present

## 2023-09-18 DIAGNOSIS — D631 Anemia in chronic kidney disease: Secondary | ICD-10-CM | POA: Diagnosis not present

## 2023-09-18 DIAGNOSIS — T8249XA Other complication of vascular dialysis catheter, initial encounter: Secondary | ICD-10-CM | POA: Diagnosis not present

## 2023-09-18 DIAGNOSIS — Z992 Dependence on renal dialysis: Secondary | ICD-10-CM | POA: Diagnosis not present

## 2023-09-18 DIAGNOSIS — D689 Coagulation defect, unspecified: Secondary | ICD-10-CM | POA: Diagnosis not present

## 2023-09-18 DIAGNOSIS — R7881 Bacteremia: Secondary | ICD-10-CM | POA: Diagnosis not present

## 2023-09-18 DIAGNOSIS — R52 Pain, unspecified: Secondary | ICD-10-CM | POA: Diagnosis not present

## 2023-09-18 DIAGNOSIS — A499 Bacterial infection, unspecified: Secondary | ICD-10-CM | POA: Diagnosis not present

## 2023-09-18 DIAGNOSIS — N2581 Secondary hyperparathyroidism of renal origin: Secondary | ICD-10-CM | POA: Diagnosis not present

## 2023-09-18 DIAGNOSIS — D688 Other specified coagulation defects: Secondary | ICD-10-CM | POA: Diagnosis not present

## 2023-09-20 ENCOUNTER — Telehealth: Payer: Self-pay

## 2023-09-20 NOTE — Telephone Encounter (Signed)
 Pt called requesting to make an appt for her AVG to get assessed, as she started to have drainage from it last Friday. She has c/o tenderness and thinks she may have bumped it. At HD this weekend, she states they looked at it and gave her antibiotics. She is wanting [place] to look at it this week. She has HD tomorrow and wants to come Wed. I have scheduled her with APP.

## 2023-09-21 DIAGNOSIS — T8249XA Other complication of vascular dialysis catheter, initial encounter: Secondary | ICD-10-CM | POA: Diagnosis not present

## 2023-09-21 DIAGNOSIS — R7881 Bacteremia: Secondary | ICD-10-CM | POA: Diagnosis not present

## 2023-09-21 DIAGNOSIS — D631 Anemia in chronic kidney disease: Secondary | ICD-10-CM | POA: Diagnosis not present

## 2023-09-21 DIAGNOSIS — A499 Bacterial infection, unspecified: Secondary | ICD-10-CM | POA: Diagnosis not present

## 2023-09-21 DIAGNOSIS — D689 Coagulation defect, unspecified: Secondary | ICD-10-CM | POA: Diagnosis not present

## 2023-09-21 DIAGNOSIS — R52 Pain, unspecified: Secondary | ICD-10-CM | POA: Diagnosis not present

## 2023-09-21 DIAGNOSIS — D688 Other specified coagulation defects: Secondary | ICD-10-CM | POA: Diagnosis not present

## 2023-09-21 DIAGNOSIS — N2581 Secondary hyperparathyroidism of renal origin: Secondary | ICD-10-CM | POA: Diagnosis not present

## 2023-09-21 DIAGNOSIS — Z992 Dependence on renal dialysis: Secondary | ICD-10-CM | POA: Diagnosis not present

## 2023-09-21 DIAGNOSIS — N186 End stage renal disease: Secondary | ICD-10-CM | POA: Diagnosis not present

## 2023-09-22 ENCOUNTER — Encounter (HOSPITAL_COMMUNITY): Payer: Self-pay | Admitting: Vascular Surgery

## 2023-09-22 ENCOUNTER — Ambulatory Visit (INDEPENDENT_AMBULATORY_CARE_PROVIDER_SITE_OTHER): Admitting: Physician Assistant

## 2023-09-22 ENCOUNTER — Other Ambulatory Visit: Payer: Self-pay

## 2023-09-22 ENCOUNTER — Encounter: Payer: Self-pay | Admitting: Physician Assistant

## 2023-09-22 VITALS — BP 94/60 | HR 113 | Temp 97.7°F | Ht 66.0 in | Wt 138.8 lb

## 2023-09-22 DIAGNOSIS — T827XXA Infection and inflammatory reaction due to other cardiac and vascular devices, implants and grafts, initial encounter: Secondary | ICD-10-CM | POA: Diagnosis not present

## 2023-09-22 DIAGNOSIS — N186 End stage renal disease: Secondary | ICD-10-CM

## 2023-09-22 MED ORDER — SULFAMETHOXAZOLE-TRIMETHOPRIM 800-160 MG PO TABS: 1.0000 | ORAL_TABLET | Freq: Two times a day (BID) | ORAL | 0 refills | Status: DC

## 2023-09-22 NOTE — H&P (View-Only) (Signed)
 POST OPERATIVE OFFICE NOTE    CC:  F/u for surgery  HPI:  [name] [name] is a 73 y.o. female who presents today as a triage visit.  She has a recent history of left upper arm AV graft creation on 07/28/2023 by Dr. Hetty Blend.  She subsequently required fistulogram on 08/23/2023 with mechanical thrombectomy of the left arm AV graft, venous outflow, arterial anastomosis, and left brachial artery plus stenting of the venous anastomosis.  This was done for a thrombosed left upper arm AV graft, thrombosed arterial anastomosis, and severe stenosis of the venous anastomosis.  Unfortunately her left upper arm AV graft occluded again due to persistent hypotension with systolic pressures below 100.  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  She returns today as a triage visit.  She says starting this past Friday, she noticed a small opening of her skin around her left upper arm with significant drainage.  She has been "thick and yellow with some occasional blood".  She endorses an increase in drainage over the past several days.  Overall her left upper arm has been increasingly sore and tender to touch.  She denies any fevers or chills.  She says she has been keeping a clean bandage over the wound on her arm since it popped up.  She states dialysis did give her some antibiotics during her session on Saturday.    Allergies  Allergen Reactions   Amoxicillin Anaphylaxis, Rash and Other (See Comments)    Throat Swelling   Aspirin Anaphylaxis, Itching, Rash and Other (See Comments)   Ciprofloxacin Swelling    Possible reaction to cipro or clindamycin 04/29/14 Possible reaction to cipro or clindamycin 04/29/14   Morphine And Codeine Anaphylaxis   Penicillins Anaphylaxis, Rash and Other (See Comments)    Throat Swelling   Gabapentin Other (See Comments)    Patient had one time seizure shortly after stopping gabapentin   Ace Inhibitors Other (See Comments) and Cough    ACE  possibly associated with cough and switched to ARB.  Would be OK to re-challenge if needed.    Current Outpatient Medications  Medication Sig Dispense Refill   acetaminophen (TYLENOL) 500 MG tablet Take 1,000 mg by mouth every 6 (six) hours as needed for mild pain (pain score 1-3) or moderate pain (pain score 4-6).     Ascorbic Acid 500 MG CAPS Take 500 mg by mouth daily.     atorvastatin (LIPITOR) 40 MG tablet Take 1 tablet (40 mg total) by mouth daily. 90 tablet 3   citalopram (CELEXA) 10 MG tablet Take 1 tablet (10 mg total) by mouth daily. 90 tablet 3   diclofenac Sodium (VOLTAREN) 1 % GEL APPLY TWO GRAMS TOPICAALY FOUR TIMES DAILY (Patient taking differently: Apply 2 g topically daily as needed (sciatica).) 100 g 0   Glycerin (CLEAR EYES ADV DRY & ITCHY RLF) 0.25 % SOLN Place 1 drop into both eyes daily as needed (Itching).     HYDROcodone-acetaminophen (NORCO/VICODIN) 5-325 MG tablet Take 1 tablet by mouth every 6 (six) hours as needed for moderate pain (pain score 4-6). 30 tablet 0   lidocaine-prilocaine (EMLA) cream Apply 1 Application topically Every Tuesday,Thursday,and Saturday with dialysis.     LORazepam (ATIVAN) 0.5 MG tablet Take 1 tablet (0.5 mg total) by mouth 2 (two) times daily as needed. for anxiety 60 tablet 0   Mesalamine 800 MG TBEC Take 1 tablet (800 mg total) by mouth 3 (three) times daily. 270  tablet 2   midodrine (PROAMATINE) 10 MG tablet Take 10 mg by mouth Every Tuesday,Thursday,and Saturday with dialysis.     mirtazapine (REMERON) 15 MG tablet TAKE ONE TABLET BY MOUTH AT BEDTIME 90 tablet 3   pregabalin (LYRICA) 25 MG capsule Take 1 capsule (25 mg total) by mouth 2 (two) times daily. 180 capsule 1   sevelamer carbonate (RENVELA) 800 MG tablet Take 800 mg by mouth 3 (three) times daily with meals.     No current facility-administered medications for this visit.     ROS:  See HPI  Physical Exam:  Incision: Left upper arm incisions well-healed Extremities: Left  upper arm significantly tender to touch.  Open wound overlying an occluded AV graft with surrounding erythema.  AV graft is visible at the surface of the skin.  Palpable left radial pulse Neuro: Intact motor and sensation of left upper extremity     Assessment/Plan:  This is a 73 y.o. female who presents today as a triage visit  -The patient recently underwent creation of a left upper arm AV graft for permanent dialysis access.  She subsequently required fistulogram with extensive venous and arterial thrombectomy and angioplasty.  Unfortunately her graft occluded again shortly after her fistulogram - She returns today as a triage visit.  She states on Friday she noticed that a wound popped up on her left upper arm and had significant drainage.  She endorses puslike drainage.  She feels like the drainage has also increased since it began on Friday - She states her left upper arm has felt very sore and tender to touch since the drainage began.  On exam she does have exquisite pain to touch of her left upper arm. - It appears that her occluded AV graft has ulcerated through the skin and created an open wound.  There is graft visible within the wound bed.  There is also surrounding erythema - I have explained to the patient that there is high concern for infection of the AV graft, causing her ulceration.  She will require AV graft excision soon to get rid of infection. - Dr. Randie Heinz was also present for evaluation of this patient.  We will plan for the patient to get outpatient dialysis on Thursday per her usual schedule.  We will then plan to admit her to the hospital on Friday and she will have excision of her left upper arm AV graft in its entirety.  She will likely require vein patch of her artery.  Due to her transportation issues and underlying suspected infection, we will likely keep her in the hospital over the weekend for antibiotics and dialysis. She is agreeable to all of the above -For the time  being prior to her admission, I will start her on an oral course of Bactrim for infection control   Loel Dubonnet, PA-C [facility] [phone]   Clinic MD:  Randie Heinz

## 2023-09-22 NOTE — Progress Notes (Signed)
 PCP - Ellwood Dense, DO Cardiologist - Dr Gerri Spore O'Neal  Chest x-ray - 04/27/22 CE EKG - 08/05/23 Stress Test - 09/26/19 ECHO - 09/26/21 Cardiac Cath - n/a  ICD Pacemaker/Loop - n/a  Sleep Study -  n/a  Diabetes - n/a  Aspirin and Blood Thinner Instructions:  n/a  NPO   Anesthesia review: Yes

## 2023-09-22 NOTE — Progress Notes (Signed)
 POST OPERATIVE OFFICE NOTE    CC:  F/u for surgery  HPI:  [name] [name] is a 73 y.o. female who presents today as a triage visit.  She has a recent history of left upper arm AV graft creation on 07/28/2023 by Dr. Hetty Blend.  She subsequently required fistulogram on 08/23/2023 with mechanical thrombectomy of the left arm AV graft, venous outflow, arterial anastomosis, and left brachial artery plus stenting of the venous anastomosis.  This was done for a thrombosed left upper arm AV graft, thrombosed arterial anastomosis, and severe stenosis of the venous anastomosis.  Unfortunately her left upper arm AV graft occluded again due to persistent hypotension with systolic pressures below 100.  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  She returns today as a triage visit.  She says starting this past Friday, she noticed a small opening of her skin around her left upper arm with significant drainage.  She has been "thick and yellow with some occasional blood".  She endorses an increase in drainage over the past several days.  Overall her left upper arm has been increasingly sore and tender to touch.  She denies any fevers or chills.  She says she has been keeping a clean bandage over the wound on her arm since it popped up.  She states dialysis did give her some antibiotics during her session on Saturday.    Allergies  Allergen Reactions   Amoxicillin Anaphylaxis, Rash and Other (See Comments)    Throat Swelling   Aspirin Anaphylaxis, Itching, Rash and Other (See Comments)   Ciprofloxacin Swelling    Possible reaction to cipro or clindamycin 04/29/14 Possible reaction to cipro or clindamycin 04/29/14   Morphine And Codeine Anaphylaxis   Penicillins Anaphylaxis, Rash and Other (See Comments)    Throat Swelling   Gabapentin Other (See Comments)    Patient had one time seizure shortly after stopping gabapentin   Ace Inhibitors Other (See Comments) and Cough    ACE  possibly associated with cough and switched to ARB.  Would be OK to re-challenge if needed.    Current Outpatient Medications  Medication Sig Dispense Refill   acetaminophen (TYLENOL) 500 MG tablet Take 1,000 mg by mouth every 6 (six) hours as needed for mild pain (pain score 1-3) or moderate pain (pain score 4-6).     Ascorbic Acid 500 MG CAPS Take 500 mg by mouth daily.     atorvastatin (LIPITOR) 40 MG tablet Take 1 tablet (40 mg total) by mouth daily. 90 tablet 3   citalopram (CELEXA) 10 MG tablet Take 1 tablet (10 mg total) by mouth daily. 90 tablet 3   diclofenac Sodium (VOLTAREN) 1 % GEL APPLY TWO GRAMS TOPICAALY FOUR TIMES DAILY (Patient taking differently: Apply 2 g topically daily as needed (sciatica).) 100 g 0   Glycerin (CLEAR EYES ADV DRY & ITCHY RLF) 0.25 % SOLN Place 1 drop into both eyes daily as needed (Itching).     HYDROcodone-acetaminophen (NORCO/VICODIN) 5-325 MG tablet Take 1 tablet by mouth every 6 (six) hours as needed for moderate pain (pain score 4-6). 30 tablet 0   lidocaine-prilocaine (EMLA) cream Apply 1 Application topically Every Tuesday,Thursday,and Saturday with dialysis.     LORazepam (ATIVAN) 0.5 MG tablet Take 1 tablet (0.5 mg total) by mouth 2 (two) times daily as needed. for anxiety 60 tablet 0   Mesalamine 800 MG TBEC Take 1 tablet (800 mg total) by mouth 3 (three) times daily. 270  tablet 2   midodrine (PROAMATINE) 10 MG tablet Take 10 mg by mouth Every Tuesday,Thursday,and Saturday with dialysis.     mirtazapine (REMERON) 15 MG tablet TAKE ONE TABLET BY MOUTH AT BEDTIME 90 tablet 3   pregabalin (LYRICA) 25 MG capsule Take 1 capsule (25 mg total) by mouth 2 (two) times daily. 180 capsule 1   sevelamer carbonate (RENVELA) 800 MG tablet Take 800 mg by mouth 3 (three) times daily with meals.     No current facility-administered medications for this visit.     ROS:  See HPI  Physical Exam:  Incision: Left upper arm incisions well-healed Extremities: Left  upper arm significantly tender to touch.  Open wound overlying an occluded AV graft with surrounding erythema.  AV graft is visible at the surface of the skin.  Palpable left radial pulse Neuro: Intact motor and sensation of left upper extremity     Assessment/Plan:  This is a 73 y.o. female who presents today as a triage visit  -The patient recently underwent creation of a left upper arm AV graft for permanent dialysis access.  She subsequently required fistulogram with extensive venous and arterial thrombectomy and angioplasty.  Unfortunately her graft occluded again shortly after her fistulogram - She returns today as a triage visit.  She states on Friday she noticed that a wound popped up on her left upper arm and had significant drainage.  She endorses puslike drainage.  She feels like the drainage has also increased since it began on Friday - She states her left upper arm has felt very sore and tender to touch since the drainage began.  On exam she does have exquisite pain to touch of her left upper arm. - It appears that her occluded AV graft has ulcerated through the skin and created an open wound.  There is graft visible within the wound bed.  There is also surrounding erythema - I have explained to the patient that there is high concern for infection of the AV graft, causing her ulceration.  She will require AV graft excision soon to get rid of infection. - Dr. Randie Heinz was also present for evaluation of this patient.  We will plan for the patient to get outpatient dialysis on Thursday per her usual schedule.  We will then plan to admit her to the hospital on Friday and she will have excision of her left upper arm AV graft in its entirety.  She will likely require vein patch of her artery.  Due to her transportation issues and underlying suspected infection, we will likely keep her in the hospital over the weekend for antibiotics and dialysis. She is agreeable to all of the above -For the time  being prior to her admission, I will start her on an oral course of Bactrim for infection control   Loel Dubonnet, PA-C [facility] [phone]   Clinic MD:  Randie Heinz

## 2023-09-23 ENCOUNTER — Other Ambulatory Visit: Payer: Self-pay

## 2023-09-23 ENCOUNTER — Encounter (HOSPITAL_COMMUNITY): Payer: Self-pay | Admitting: Vascular Surgery

## 2023-09-23 DIAGNOSIS — D631 Anemia in chronic kidney disease: Secondary | ICD-10-CM | POA: Diagnosis not present

## 2023-09-23 DIAGNOSIS — R52 Pain, unspecified: Secondary | ICD-10-CM | POA: Diagnosis not present

## 2023-09-23 DIAGNOSIS — R7881 Bacteremia: Secondary | ICD-10-CM | POA: Diagnosis not present

## 2023-09-23 DIAGNOSIS — N186 End stage renal disease: Secondary | ICD-10-CM | POA: Diagnosis not present

## 2023-09-23 DIAGNOSIS — D688 Other specified coagulation defects: Secondary | ICD-10-CM | POA: Diagnosis not present

## 2023-09-23 DIAGNOSIS — D689 Coagulation defect, unspecified: Secondary | ICD-10-CM | POA: Diagnosis not present

## 2023-09-23 DIAGNOSIS — N2581 Secondary hyperparathyroidism of renal origin: Secondary | ICD-10-CM | POA: Diagnosis not present

## 2023-09-23 DIAGNOSIS — T8249XA Other complication of vascular dialysis catheter, initial encounter: Secondary | ICD-10-CM | POA: Diagnosis not present

## 2023-09-23 DIAGNOSIS — A499 Bacterial infection, unspecified: Secondary | ICD-10-CM | POA: Diagnosis not present

## 2023-09-23 DIAGNOSIS — Z992 Dependence on renal dialysis: Secondary | ICD-10-CM | POA: Diagnosis not present

## 2023-09-23 NOTE — Anesthesia Preprocedure Evaluation (Signed)
 Anesthesia Evaluation  Patient identified by MRN, date of birth, ID band Patient awake    Reviewed: Allergy & Precautions, NPO status , Patient's Chart, lab work & pertinent test results  History of Anesthesia Complications Negative for: history of anesthetic complications  Airway Mallampati: III  TM Distance: >3 FB Neck ROM: Full    Dental  (+) Edentulous Upper, Partial Lower, Dental Advisory Given, Loose,    Pulmonary asthma , pneumonia, neg recent URI, Current Smoker and Patient abstained from smoking.   breath sounds clear to auscultation (-) wheezing      Cardiovascular hypertension, + CAD, + Peripheral Vascular Disease and +CHF  + Valvular Problems/Murmurs MR and AI  Rhythm:Regular Rate:Normal  Echo 09/2021  1. Compared with the echo in 2021, systolic function has normalized.   2. Left ventricular ejection fraction, by estimation, is 60 to 65%. The left ventricle has normal function. The left ventricle has no regional wall motion abnormalities. Left ventricular diastolic parameters are consistent with Grade I diastolic dysfunction (impaired relaxation).   3. Right ventricular systolic function is normal. The right ventricular size is normal. There is normal pulmonary artery systolic pressure.   4. Compared with the echo 08/2019, anterior mitral valve leaflet is more thickened and appears to be rheumatic. The mitral valve is rheumatic. Mild mitral valve regurgitation. No evidence of mitral stenosis. There is mild holosystolic prolapse of  anterior of the mitral valve.   5. The aortic valve is tricuspid. Aortic valve regurgitation is mild. No aortic stenosis is present. Aortic regurgitation PHT measures 567 msec.   6. The inferior vena cava is normal in size with greater than 50% respiratory variability, suggesting right atrial pressure of 3 mmHg.     Stress 09/2019  EKG not interpretable due to resting ST depressions  The left  ventricular ejection fraction is moderately decreased (30-44%).  Nuclear stress EF: 35%.  Defect 1: There is a medium defect of mild severity present in the basal anteroseptal, mid anteroseptal, apical anterior and apex location.  Defect 2: There is a small defect of mild severity present in the basal inferior, mid inferior and apical inferior location.  Findings consistent with ischemia and prior myocardial infarction.  This is an intermediate risk study due to moderately decreased systolic function. Small amount of ischemia   1. Fixed defect consistent with infarct in apex, apical anterior/inferior, and mid to basal anteroseptal walls 2. Reversible defect consistent with ischemia in basal to mid inferior wall consistent with ischemia.     Neuro/Psych Seizures -, Well Controlled,  PSYCHIATRIC DISORDERS Anxiety      Neuromuscular disease    GI/Hepatic Neg liver ROS,GERD  Controlled,,  Endo/Other  negative endocrine ROS    Renal/GU ESRF and DialysisRenal diseaseLab Results      Component                Value               Date                      NA                       140                 07/28/2023                K  4.3                 07/28/2023                CO2                      21 (L)              02/28/2022                GLUCOSE                  97                  07/28/2023                BUN                      39 (H)              07/28/2023                CREATININE               8.20 (H)            07/28/2023                CALCIUM                  8.5 (L)             02/28/2022                GFR                      30.69 (L)           09/28/2013                GFRNONAA                 4 (L)               02/28/2022                Musculoskeletal  (+) Arthritis ,    Abdominal   Peds  Hematology  (+) Blood dyscrasia, anemia Lab Results      Component                Value               Date                      WBC                       6.0                 02/28/2022                HGB                      10.9 (L)            07/28/2023                HCT                      32.0 (L)  07/28/2023                MCV                      106.3 (H)           02/28/2022                PLT                      166                 02/28/2022              Anesthesia Other Findings   Reproductive/Obstetrics                             Anesthesia Physical Anesthesia Plan  ASA: 3  Anesthesia Plan: General   Post-op Pain Management: Ofirmev IV (intra-op)*   Induction: Intravenous  PONV Risk Score and Plan: 3 and Ondansetron, Dexamethasone and Treatment may vary due to age or medical condition  Airway Management Planned: Oral ETT  Additional Equipment: None  Intra-op Plan:   Post-operative Plan: Extubation in OR  Informed Consent: I have reviewed the patients History and Physical, chart, labs and discussed the procedure including the risks, benefits and alternatives for the proposed anesthesia with the patient or authorized representative who has indicated his/her understanding and acceptance.     Dental advisory given  Plan Discussed with: CRNA  Anesthesia Plan Comments: (See PAT note from 3/20)        Anesthesia Quick Evaluation

## 2023-09-23 NOTE — Progress Notes (Signed)
 PCP - Caro Laroche, DO  Cardiologist - O'Neal, Ronnald Ramp, MD   PPM/ICD - denies Device Orders - n/a Rep Notified - n/a  Chest x-ray - 04-27-22 (CE) EKG - 08-05-23 Stress Test - 09-26-19 ECHO - 09-26-21 Cardiac Cath -   CPAP - denies   DM -denies  Blood Thinner Instructions: denies Aspirin Instructions: n/a  ERAS Protcol - NPO  COVID TEST- N/a  Anesthesia review: yes  Patient verbally denies any shortness of breath, fever, cough and chest pain during phone call   -------------  SDW INSTRUCTIONS given:  Your procedure is scheduled on September 24, 2023.  Report to [facility] Main Entrance "A" at 6:30 A.M., and check in at the Admitting office.  Call this number if you have problems the morning of surgery:  [phone]   Remember:  Do not eat or drink after midnight the night before your surgery      Take these medicines the morning of surgery with A SIP OF WATER  atorvastatin (LIPITOR)  citalopram (CELEXA)  Mesalamine  pregabalin (LYRICA)  sulfamethoxazole-trimethoprim (BACTRIM DS   IF NEEDED acetaminophen (TYLENOL)  Glycerin (CLEAR EYES ADV DRY & ITCHY RLF)  LORazepam (ATIVAN)  HYDROcodone-acetaminophen (NORCO/VICODIN)    As of today, STOP taking any Aspirin (unless otherwise instructed by your surgeon) Aleve, Naproxen, Ibuprofen, Motrin, Advil, Goody's, BC's, all herbal medications, fish oil, and all vitamins. THIS INCLUDES YOUR diclofenac Sodium (VOLTAREN).                       Do not wear jewelry, make up, or nail polish            Do not wear lotions, powders, perfumes/colognes, or deodorant.            Do not shave 48 hours prior to surgery.  Men may shave face and neck.            Do not bring valuables to the hospital.            [facility] is not responsible for any belongings or valuables.  Do NOT Smoke (Tobacco/Vaping) 24 hours prior to your procedure If you use a CPAP at night, you may bring all equipment for your overnight stay.    Contacts, glasses, dentures or bridgework may not be worn into surgery.      For patients admitted to the hospital, discharge time will be determined by your treatment team.   Patients discharged the day of surgery will not be allowed to drive home, and someone needs to stay with them for 24 hours.    Special instructions:   [place]- Preparing For Surgery  Before surgery, you can play an important role. Because skin is not sterile, your skin needs to be as free of germs as possible. You can reduce the number of germs on your skin by washing with CHG (chlorahexidine gluconate) Soap before surgery.  CHG is an antiseptic cleaner which kills germs and bonds with the skin to continue killing germs even after washing.    Oral Hygiene is also important to reduce your risk of infection.  Remember - BRUSH YOUR TEETH THE MORNING OF SURGERY WITH YOUR REGULAR TOOTHPASTE  Please do not use if you have an allergy to CHG or antibacterial soaps. If your skin becomes reddened/irritated stop using the CHG.  Do not shave (including legs and underarms) for at least 48 hours prior to first CHG shower. It is OK to shave your face.  Please follow these instructions carefully.   Shower the NIGHT BEFORE SURGERY and the MORNING OF SURGERY with DIAL Soap.   Pat yourself dry with a CLEAN TOWEL.  Wear CLEAN PAJAMAS to bed the night before surgery  Place CLEAN SHEETS on your bed the night of your first shower and DO NOT SLEEP WITH PETS.   Day of Surgery: Please shower morning of surgery  Wear Clean/Comfortable clothing the morning of surgery Do not apply any deodorants/lotions.   Remember to brush your teeth WITH YOUR REGULAR TOOTHPASTE.   Questions were answered. Patient verbalized understanding of instructions.

## 2023-09-23 NOTE — Progress Notes (Signed)
 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 PMH of smoking, HTN, ESRD on HD (Tu/Thurs/Sat), HLD, Crohn's disease, seizure (1 episode in 2012, felt related to gabapentin), GERD, systolic heart failure with recovered EF, hypotension on midodrine, chronic anemia.  Patient underwent LUE AV graft placement on 07/28/23. No immediate complications but graft is occluded on follow up with Vascular and eroding through skin with c/f infection. She is on antibiotics. Now scheduled for surgery above.  Patient previously followed with Cardiology for systolic CHF. She had nuclear stress test in 09/2019 was read as abnormal, however, she was subsequently seen by cardiologist Dr. Flora Lipps on 04/10/2020 and he stated the test was, "normal on my review".  Most recent echo 10/05/2021 showed EF 60 to 65%, grade 1 DD, mild mitral regurgitation, mild aortic regurgitation.  Patient no longer following with cardiology; chronic conditions followed by PCP Dr. Linwood Dibbles.  Last seen 07/19/2023.  Per note, "Last ECHO 2023 with recovered LVEF, thought 2/2 ?viral myocarditis. Not on imdur or coreg in >2 years, med list updated. Likely not needed at this time due to recovered function, asymptomatic, continue to monitor."   ESRD on HD Tuesday Thursday Saturday.  She is currently using a right-sided TDC.  She has a prior right upper arm AV graft that has occluded.   VS: There were no vitals taken for this visit.  PROVIDERSCaro Laroche, DO   LABS: Obtain DOS   EKG 08/05/23:  NSR, rate 97   CV: TTE 09/26/2021:  1. Compared with the echo in 2021,  systolic function has normalized.   2. Left ventricular ejection fraction, by estimation, is 60 to 65%. The  left ventricle has normal function. The left ventricle has no regional  wall motion abnormalities. Left ventricular diastolic parameters are  consistent with Grade I diastolic  dysfunction (impaired relaxation).   3. Right ventricular systolic function is normal. The right ventricular  size is normal. There is normal pulmonary artery systolic pressure.   4. Compared with the echo 08/2019, anterior mitral valve leaflet is more  thickened and appears to be rheumatic. The mitral valve is rheumatic. Mild  mitral valve regurgitation. No evidence of mitral stenosis. There is mild  holosystolic prolapse of  anterior of the mitral valve.   5. The aortic valve is tricuspid. Aortic valve regurgitation is mild. No  aortic stenosis is present. Aortic regurgitation PHT measures 567 msec.   6. The inferior vena cava is normal in size with greater than 50%  respiratory variability, suggesting right atrial pressure of 3 mmHg.   Stress test 09/26/2019:  EKG not interpretable due to resting ST depressions The left ventricular ejection fraction is moderately decreased (30-44%). Nuclear stress EF: 35%. Defect 1: There is a medium defect of mild severity present in the basal anteroseptal, mid anteroseptal, apical anterior and apex location. Defect 2: There is a small defect of mild severity present in the basal inferior, mid inferior and apical inferior location. Findings consistent with ischemia and prior myocardial infarction. This is an intermediate risk study due to moderately decreased systolic function.  Small amount of ischemia   1. Fixed defect consistent with infarct in apex, apical anterior/inferior, and mid to basal anteroseptal walls 2. Reversible defect consistent with ischemia in basal to mid inferior wall consistent with ischemia.   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   Hyperlipidemia    Hypertension    Hypertensive retinopathy    OU   Osteopenia    Panic attacks    Pneumonia    Reflux esophagitis    Seizure (HCC)    hx of   Tobacco abuse    Vitamin D deficiency     Past Surgical History:  Procedure Laterality Date   A/V FISTULAGRAM Left 08/23/2023   Procedure: A/V Fistulagram;  Surgeon: [name], MD;  Location: [facility] INVASIVE CV LAB;  Service: Cardiovascular;  Laterality: Left;   AV FISTULA PLACEMENT Right 04/28/2021   Procedure: RIGHT ARTERIOVENOUS (AV) GRAFT INSERTION USING 4-7MM x 45CM GORE-TEX GRAFT;  Surgeon: Chuck Hint, MD;  Location: [facility] OR;  Service: Vascular;  Laterality: Right;   AV FISTULA PLACEMENT Left 07/28/2023   Procedure: LEFT ARM ARTERIOVENOUS (AV) GRAFT USING CORE-TEX STRETCH VASCULAR;  Surgeon: [name], MD;  Location: [facility];  Service: Vascular;  Laterality: Left;   CATARACT EXTRACTION Bilateral    Dr. Ernesto Rutherford   CATARACT EXTRACTION W/ INTRAOCULAR LENS  IMPLANT, BILATERAL  ~ 2000   CHOLECYSTECTOMY  01/28/2005   COLOSTOMY  03/1996   diverting   ECTOPIC PREGNANCY SURGERY  ?1980's   left   EYE SURGERY Bilateral    Cat Sx - Dr. Ernesto Rutherford   ILEOSTOMY  ?  2002   IR FLUORO GUIDE CV LINE RIGHT  04/25/2021   IR US GUIDE VASC ACCESS RIGHT  04/25/2021   NECK SURGERY  2020   "pinched nerve"   PERIPHERAL VASCULAR BALLOON ANGIOPLASTY Left 08/23/2023   Procedure: PERIPHERAL VASCULAR BALLOON ANGIOPLASTY;  Surgeon: [name], MD;   [facility] INVASIVE CV LAB;  Service: Cardiovascular;  Laterality: Left;   PERIPHERAL VASCULAR INTERVENTION Left 08/23/2023   Procedure: PERIPHERAL VASCULAR INTERVENTION;  Surgeon: [name], MD;  Location: [facility] INVASIVE CV LAB;  Service: Cardiovascular;  Laterality: Left;   PERIPHERAL VASCULAR THROMBECTOMY Left 08/23/2023   Procedure: PERIPHERAL VASCULAR THROMBECTOMY;  Surgeon: [name], MD;  Location: [facility] INVASIVE CV LAB;  Service: Cardiovascular;  Laterality: Left;    MEDICATIONS: No current facility-administered medications for this encounter.    acetaminophen (TYLENOL) 500 MG tablet   Ascorbic Acid 500 MG CAPS   atorvastatin (LIPITOR) 40 MG tablet   citalopram (CELEXA) 10 MG tablet   diclofenac Sodium (VOLTAREN) 1 % GEL   Glycerin (CLEAR EYES ADV DRY & ITCHY RLF) 0.25 % SOLN   HYDROcodone-acetaminophen (NORCO/VICODIN) 5-325 MG tablet   lidocaine-prilocaine (EMLA) cream   LORazepam (ATIVAN) 0.5 MG tablet   Mesalamine 800 MG TBEC   midodrine (PROAMATINE) 10 MG tablet  mirtazapine (REMERON) 15 MG tablet   pregabalin (LYRICA) 25 MG capsule   sevelamer carbonate (RENVELA) 800 MG tablet   sulfamethoxazole-trimethoprim (BACTRIM DS) 800-160 MG tablet    Ubaldo Glassing, PA-C [facility] Surgical Short Stay/Anesthesiology [facility] Phone [phone] 09/23/2023 9:10 AM

## 2023-09-23 NOTE — Progress Notes (Signed)
 Message left with Dr, Randie Heinz scheduler making the aware patient may not have a ride home tomorrow after procedure.  When asked she said she was going to take a cab.  Patient made aware she could not do that

## 2023-09-24 ENCOUNTER — Inpatient Hospital Stay (HOSPITAL_COMMUNITY): Admitting: Medical

## 2023-09-24 ENCOUNTER — Inpatient Hospital Stay (HOSPITAL_COMMUNITY)
Admission: RE | Admit: 2023-09-24 | Discharge: 2023-09-25 | DRG: 252 | Disposition: A | Discharge status: Home / Self Care | Attending: Vascular Surgery | Admitting: Vascular Surgery

## 2023-09-24 ENCOUNTER — Encounter (HOSPITAL_COMMUNITY): Payer: Self-pay | Admitting: Vascular Surgery

## 2023-09-24 ENCOUNTER — Other Ambulatory Visit: Payer: Self-pay

## 2023-09-24 ENCOUNTER — Inpatient Hospital Stay: Admission: RE | Admit: 2023-09-24 | Source: Ambulatory Visit

## 2023-09-24 ENCOUNTER — Encounter (HOSPITAL_COMMUNITY)
Admission: RE | Disposition: A | Discharge status: Home / Self Care | Payer: Self-pay | Source: Home / Self Care | Attending: Vascular Surgery

## 2023-09-24 DIAGNOSIS — M8580 Other specified disorders of bone density and structure, unspecified site: Secondary | ICD-10-CM | POA: Diagnosis present

## 2023-09-24 DIAGNOSIS — I5020 Unspecified systolic (congestive) heart failure: Secondary | ICD-10-CM | POA: Diagnosis not present

## 2023-09-24 DIAGNOSIS — Z880 Allergy status to penicillin: Secondary | ICD-10-CM | POA: Diagnosis not present

## 2023-09-24 DIAGNOSIS — Z87891 Personal history of nicotine dependence: Secondary | ICD-10-CM | POA: Diagnosis not present

## 2023-09-24 DIAGNOSIS — Z881 Allergy status to other antibiotic agents status: Secondary | ICD-10-CM

## 2023-09-24 DIAGNOSIS — T827XXA Infection and inflammatory reaction due to other cardiac and vascular devices, implants and grafts, initial encounter: Principal | ICD-10-CM | POA: Diagnosis present

## 2023-09-24 DIAGNOSIS — R569 Unspecified convulsions: Secondary | ICD-10-CM | POA: Diagnosis present

## 2023-09-24 DIAGNOSIS — E785 Hyperlipidemia, unspecified: Secondary | ICD-10-CM | POA: Diagnosis present

## 2023-09-24 DIAGNOSIS — N186 End stage renal disease: Principal | ICD-10-CM | POA: Diagnosis present

## 2023-09-24 DIAGNOSIS — Z79899 Other long term (current) drug therapy: Secondary | ICD-10-CM

## 2023-09-24 DIAGNOSIS — I2510 Atherosclerotic heart disease of native coronary artery without angina pectoris: Secondary | ICD-10-CM | POA: Diagnosis present

## 2023-09-24 DIAGNOSIS — N185 Chronic kidney disease, stage 5: Secondary | ICD-10-CM | POA: Diagnosis not present

## 2023-09-24 DIAGNOSIS — Y832 Surgical operation with anastomosis, bypass or graft as the cause of abnormal reaction of the patient, or of later complication, without mention of misadventure at the time of the procedure: Secondary | ICD-10-CM | POA: Diagnosis present

## 2023-09-24 DIAGNOSIS — T82868A Thrombosis of vascular prosthetic devices, implants and grafts, initial encounter: Secondary | ICD-10-CM | POA: Diagnosis present

## 2023-09-24 DIAGNOSIS — K5090 Crohn's disease, unspecified, without complications: Secondary | ICD-10-CM | POA: Diagnosis present

## 2023-09-24 DIAGNOSIS — R52 Pain, unspecified: Secondary | ICD-10-CM | POA: Diagnosis not present

## 2023-09-24 DIAGNOSIS — Z888 Allergy status to other drugs, medicaments and biological substances status: Secondary | ICD-10-CM

## 2023-09-24 DIAGNOSIS — I959 Hypotension, unspecified: Secondary | ICD-10-CM | POA: Diagnosis present

## 2023-09-24 DIAGNOSIS — I110 Hypertensive heart disease with heart failure: Secondary | ICD-10-CM | POA: Diagnosis not present

## 2023-09-24 DIAGNOSIS — Z992 Dependence on renal dialysis: Secondary | ICD-10-CM

## 2023-09-24 DIAGNOSIS — N2581 Secondary hyperparathyroidism of renal origin: Secondary | ICD-10-CM | POA: Diagnosis not present

## 2023-09-24 DIAGNOSIS — I132 Hypertensive heart and chronic kidney disease with heart failure and with stage 5 chronic kidney disease, or end stage renal disease: Secondary | ICD-10-CM | POA: Diagnosis present

## 2023-09-24 DIAGNOSIS — I509 Heart failure, unspecified: Secondary | ICD-10-CM | POA: Diagnosis not present

## 2023-09-24 DIAGNOSIS — I5022 Chronic systolic (congestive) heart failure: Secondary | ICD-10-CM | POA: Diagnosis present

## 2023-09-24 DIAGNOSIS — F17210 Nicotine dependence, cigarettes, uncomplicated: Secondary | ICD-10-CM | POA: Diagnosis not present

## 2023-09-24 DIAGNOSIS — Z5982 Transportation insecurity: Secondary | ICD-10-CM

## 2023-09-24 DIAGNOSIS — T8249XA Other complication of vascular dialysis catheter, initial encounter: Secondary | ICD-10-CM | POA: Diagnosis not present

## 2023-09-24 DIAGNOSIS — K2100 Gastro-esophageal reflux disease with esophagitis, without bleeding: Secondary | ICD-10-CM | POA: Diagnosis present

## 2023-09-24 DIAGNOSIS — A499 Bacterial infection, unspecified: Secondary | ICD-10-CM | POA: Diagnosis not present

## 2023-09-24 DIAGNOSIS — J45909 Unspecified asthma, uncomplicated: Secondary | ICD-10-CM | POA: Diagnosis present

## 2023-09-24 DIAGNOSIS — Z886 Allergy status to analgesic agent status: Secondary | ICD-10-CM

## 2023-09-24 DIAGNOSIS — D631 Anemia in chronic kidney disease: Secondary | ICD-10-CM | POA: Diagnosis present

## 2023-09-24 DIAGNOSIS — R7881 Bacteremia: Secondary | ICD-10-CM | POA: Diagnosis not present

## 2023-09-24 DIAGNOSIS — D688 Other specified coagulation defects: Secondary | ICD-10-CM | POA: Diagnosis not present

## 2023-09-24 DIAGNOSIS — D689 Coagulation defect, unspecified: Secondary | ICD-10-CM | POA: Diagnosis not present

## 2023-09-24 HISTORY — PX: REMOVAL OF GRAFT: SHX6361

## 2023-09-24 HISTORY — DX: Pneumonia, unspecified organism: J18.9

## 2023-09-24 LAB — POCT I-STAT, CHEM 8
BUN: 24 mg/dL — ABNORMAL HIGH (ref 8–23)
Calcium, Ion: 1.14 mmol/L — ABNORMAL LOW (ref 1.15–1.40)
Chloride: 108 mmol/L (ref 98–111)
Creatinine, Ser: 9.9 mg/dL — ABNORMAL HIGH (ref 0.44–1.00)
Glucose, Bld: 81 mg/dL (ref 70–99)
HCT: 40 % (ref 36.0–46.0)
Hemoglobin: 13.6 g/dL (ref 12.0–15.0)
Potassium: 4 mmol/L (ref 3.5–5.1)
Sodium: 138 mmol/L (ref 135–145)
TCO2: 20 mmol/L — ABNORMAL LOW (ref 22–32)

## 2023-09-24 LAB — CBC
HCT: 36.1 % (ref 36.0–46.0)
Hemoglobin: 11.1 g/dL — ABNORMAL LOW (ref 12.0–15.0)
MCH: 33.2 pg (ref 26.0–34.0)
MCHC: 30.7 g/dL (ref 30.0–36.0)
MCV: 108.1 fL — ABNORMAL HIGH (ref 80.0–100.0)
Platelets: 86 10*3/uL — ABNORMAL LOW (ref 150–400)
RBC: 3.34 MIL/uL — ABNORMAL LOW (ref 3.87–5.11)
RDW: 18.6 % — ABNORMAL HIGH (ref 11.5–15.5)
WBC: 8.1 10*3/uL (ref 4.0–10.5)
nRBC: 2 % — ABNORMAL HIGH (ref 0.0–0.2)

## 2023-09-24 LAB — COMPREHENSIVE METABOLIC PANEL
ALT: 55 U/L — ABNORMAL HIGH (ref 0–44)
AST: 44 U/L — ABNORMAL HIGH (ref 15–41)
Albumin: 3.2 g/dL — ABNORMAL LOW (ref 3.5–5.0)
Alkaline Phosphatase: 141 U/L — ABNORMAL HIGH (ref 38–126)
Anion gap: 14 (ref 5–15)
BUN: 27 mg/dL — ABNORMAL HIGH (ref 8–23)
CO2: 17 mmol/L — ABNORMAL LOW (ref 22–32)
Calcium: 8.4 mg/dL — ABNORMAL LOW (ref 8.9–10.3)
Chloride: 103 mmol/L (ref 98–111)
Creatinine, Ser: 9.9 mg/dL — ABNORMAL HIGH (ref 0.44–1.00)
GFR, Estimated: 4 mL/min — ABNORMAL LOW (ref >60)
Glucose, Bld: 213 mg/dL — ABNORMAL HIGH (ref 70–99)
Potassium: 5 mmol/L (ref 3.5–5.1)
Sodium: 134 mmol/L — ABNORMAL LOW (ref 135–145)
Total Bilirubin: 0.6 mg/dL (ref 0.0–1.2)
Total Protein: 6.9 g/dL (ref 6.5–8.1)

## 2023-09-24 LAB — PROTIME-INR
INR: 1.1 (ref 0.8–1.2)
Prothrombin Time: 14.4 s (ref 11.4–15.2)

## 2023-09-24 LAB — MRSA NEXT GEN BY PCR, NASAL: MRSA by PCR Next Gen: NOT DETECTED

## 2023-09-24 LAB — HIV ANTIBODY (ROUTINE TESTING W REFLEX): HIV Screen 4th Generation wRfx: NONREACTIVE

## 2023-09-24 SURGERY — REMOVAL, GRAFT
Anesthesia: General | Laterality: Left

## 2023-09-24 MED ORDER — GLYCOPYRROLATE PF 0.2 MG/ML IJ SOSY
PREFILLED_SYRINGE | INTRAMUSCULAR | Status: AC
  Filled 2023-09-24: qty 2

## 2023-09-24 MED ORDER — OXYCODONE HCL 5 MG PO TABS: 5.0000 mg | ORAL_TABLET | Freq: Once | ORAL | Status: DC | PRN

## 2023-09-24 MED ORDER — ENOXAPARIN SODIUM 30 MG/0.3ML IJ SOSY
30.0000 mg | PREFILLED_SYRINGE | INTRAMUSCULAR | Status: DC
  Administered 2023-09-25: 30 mg via SUBCUTANEOUS
  Filled 2023-09-24: qty 0.3

## 2023-09-24 MED ORDER — VANCOMYCIN HCL IN DEXTROSE 1-5 GM/200ML-% IV SOLN
1000.0000 mg | INTRAVENOUS | Status: AC
Start: 2023-09-24 — End: 2023-09-24
  Administered 2023-09-24: 1000 mg via INTRAVENOUS
  Filled 2023-09-24: qty 200

## 2023-09-24 MED ORDER — MIRTAZAPINE 15 MG PO TABS
15.0000 mg | ORAL_TABLET | Freq: Every day | ORAL | Status: DC
  Administered 2023-09-24: 15 mg via ORAL
  Filled 2023-09-24: qty 1

## 2023-09-24 MED ORDER — GLYCOPYRROLATE 0.2 MG/ML IJ SOLN
INTRAMUSCULAR | Status: DC | PRN
  Administered 2023-09-24: .4 mg via INTRAVENOUS

## 2023-09-24 MED ORDER — FENTANYL CITRATE (PF) 250 MCG/5ML IJ SOLN
INTRAMUSCULAR | Status: DC | PRN
  Administered 2023-09-24 (×3): 50 ug via INTRAVENOUS
  Administered 2023-09-24: 100 ug via INTRAVENOUS

## 2023-09-24 MED ORDER — ALUM & MAG HYDROXIDE-SIMETH 200-200-20 MG/5ML PO SUSP: 15.0000 mL | ORAL | Status: DC | PRN

## 2023-09-24 MED ORDER — DROPERIDOL 2.5 MG/ML IJ SOLN: 0.6250 mg | Freq: Once | INTRAMUSCULAR | Status: DC | PRN

## 2023-09-24 MED ORDER — CHLORHEXIDINE GLUCONATE 4 % EX SOLN: 60.0000 mL | Freq: Once | CUTANEOUS | Status: DC

## 2023-09-24 MED ORDER — SENNOSIDES-DOCUSATE SODIUM 8.6-50 MG PO TABS: 1.0000 | ORAL_TABLET | Freq: Every evening | ORAL | Status: DC | PRN

## 2023-09-24 MED ORDER — SUGAMMADEX SODIUM 200 MG/2ML IV SOLN
INTRAVENOUS | Status: DC | PRN
  Administered 2023-09-24: 120 mg via INTRAVENOUS

## 2023-09-24 MED ORDER — PHENOL 1.4 % MT LIQD: 1.0000 | OROMUCOSAL | Status: DC | PRN

## 2023-09-24 MED ORDER — ACETAMINOPHEN 10 MG/ML IV SOLN
INTRAVENOUS | Status: DC | PRN
  Administered 2023-09-24: 1000 mg via INTRAVENOUS

## 2023-09-24 MED ORDER — SULFAMETHOXAZOLE-TRIMETHOPRIM 800-160 MG PO TABS
1.0000 | ORAL_TABLET | Freq: Two times a day (BID) | ORAL | Status: DC
Start: 2023-09-24 — End: 2023-09-24
  Filled 2023-09-24: qty 1

## 2023-09-24 MED ORDER — DEXAMETHASONE SODIUM PHOSPHATE 10 MG/ML IJ SOLN
INTRAMUSCULAR | Status: AC
  Filled 2023-09-24: qty 1

## 2023-09-24 MED ORDER — FENTANYL CITRATE (PF) 250 MCG/5ML IJ SOLN
INTRAMUSCULAR | Status: AC
  Filled 2023-09-24: qty 5

## 2023-09-24 MED ORDER — PROPOFOL 10 MG/ML IV BOLUS
INTRAVENOUS | Status: AC
  Filled 2023-09-24: qty 20

## 2023-09-24 MED ORDER — MIDODRINE HCL 5 MG PO TABS: 10.0000 mg | ORAL_TABLET | ORAL | Status: DC

## 2023-09-24 MED ORDER — LORAZEPAM 0.5 MG PO TABS: 0.5000 mg | ORAL_TABLET | Freq: Two times a day (BID) | ORAL | Status: DC | PRN

## 2023-09-24 MED ORDER — 0.9 % SODIUM CHLORIDE (POUR BTL) OPTIME
TOPICAL | Status: DC | PRN
  Administered 2023-09-24: 1000 mL

## 2023-09-24 MED ORDER — NEOSTIGMINE METHYLSULFATE 3 MG/3ML IV SOSY
PREFILLED_SYRINGE | INTRAVENOUS | Status: AC
  Filled 2023-09-24: qty 3

## 2023-09-24 MED ORDER — PHENYLEPHRINE HCL-NACL 20-0.9 MG/250ML-% IV SOLN
INTRAVENOUS | Status: DC | PRN
  Administered 2023-09-24: 30 ug/min via INTRAVENOUS

## 2023-09-24 MED ORDER — GUAIFENESIN-DM 100-10 MG/5ML PO SYRP: 15.0000 mL | ORAL_SOLUTION | ORAL | Status: DC | PRN

## 2023-09-24 MED ORDER — LABETALOL HCL 5 MG/ML IV SOLN: 10.0000 mg | INTRAVENOUS | Status: DC | PRN

## 2023-09-24 MED ORDER — PROPOFOL 10 MG/ML IV BOLUS
INTRAVENOUS | Status: DC | PRN
  Administered 2023-09-24: 90 mg via INTRAVENOUS

## 2023-09-24 MED ORDER — HYDROCODONE-ACETAMINOPHEN 5-325 MG PO TABS
1.0000 | ORAL_TABLET | Freq: Four times a day (QID) | ORAL | Status: DC | PRN
  Administered 2023-09-24 – 2023-09-25 (×2): 2 via ORAL
  Filled 2023-09-24 (×2): qty 2

## 2023-09-24 MED ORDER — SODIUM CHLORIDE 0.9% FLUSH
3.0000 mL | Freq: Two times a day (BID) | INTRAVENOUS | Status: DC
  Administered 2023-09-24 – 2023-09-25 (×2): 3 mL via INTRAVENOUS

## 2023-09-24 MED ORDER — ONDANSETRON HCL 4 MG/2ML IJ SOLN
INTRAMUSCULAR | Status: AC
  Filled 2023-09-24: qty 2

## 2023-09-24 MED ORDER — SODIUM CHLORIDE 0.9% FLUSH: 3.0000 mL | Freq: Two times a day (BID) | INTRAVENOUS | Status: DC

## 2023-09-24 MED ORDER — ONDANSETRON HCL 4 MG/2ML IJ SOLN
INTRAMUSCULAR | Status: DC | PRN
Start: 2023-09-24 — End: 2023-09-24
  Administered 2023-09-24: 4 mg via INTRAVENOUS

## 2023-09-24 MED ORDER — PREGABALIN 25 MG PO CAPS
25.0000 mg | ORAL_CAPSULE | Freq: Two times a day (BID) | ORAL | Status: DC
  Administered 2023-09-24 – 2023-09-25 (×2): 25 mg via ORAL
  Filled 2023-09-24 (×2): qty 1

## 2023-09-24 MED ORDER — VITAMIN C 500 MG PO TABS
500.0000 mg | ORAL_TABLET | Freq: Every day | ORAL | Status: DC
  Administered 2023-09-25: 500 mg via ORAL
  Filled 2023-09-24: qty 1

## 2023-09-24 MED ORDER — SUGAMMADEX SODIUM 200 MG/2ML IV SOLN
INTRAVENOUS | Status: AC
  Filled 2023-09-24: qty 2

## 2023-09-24 MED ORDER — CHLORHEXIDINE GLUCONATE 0.12 % MT SOLN
15.0000 mL | Freq: Once | OROMUCOSAL | Status: AC
  Administered 2023-09-24: 15 mL via OROMUCOSAL
  Filled 2023-09-24: qty 15

## 2023-09-24 MED ORDER — HEPARIN 6000 UNIT IRRIGATION SOLUTION
Status: AC
  Filled 2023-09-24: qty 500

## 2023-09-24 MED ORDER — ATORVASTATIN CALCIUM 40 MG PO TABS
40.0000 mg | ORAL_TABLET | Freq: Every day | ORAL | Status: DC
  Administered 2023-09-25: 40 mg via ORAL
  Filled 2023-09-24: qty 1

## 2023-09-24 MED ORDER — BISACODYL 5 MG PO TBEC: 5.0000 mg | DELAYED_RELEASE_TABLET | Freq: Every day | ORAL | Status: DC | PRN

## 2023-09-24 MED ORDER — SEVELAMER CARBONATE 800 MG PO TABS
800.0000 mg | ORAL_TABLET | Freq: Three times a day (TID) | ORAL | Status: DC
  Administered 2023-09-25: 800 mg via ORAL
  Filled 2023-09-24: qty 1

## 2023-09-24 MED ORDER — MESALAMINE 400 MG PO CPDR
800.0000 mg | DELAYED_RELEASE_CAPSULE | Freq: Three times a day (TID) | ORAL | Status: DC
  Administered 2023-09-24 – 2023-09-25 (×2): 800 mg via ORAL
  Filled 2023-09-24 (×2): qty 2

## 2023-09-24 MED ORDER — SODIUM CHLORIDE 0.9 % IV SOLN: 250.0000 mL | INTRAVENOUS | Status: DC | PRN

## 2023-09-24 MED ORDER — ROCURONIUM BROMIDE 100 MG/10ML IV SOLN
INTRAVENOUS | Status: DC | PRN
Start: 2023-09-24 — End: 2023-09-24
  Administered 2023-09-24: 40 mg via INTRAVENOUS

## 2023-09-24 MED ORDER — HYDROMORPHONE HCL 1 MG/ML IJ SOLN: 0.5000 mg | INTRAMUSCULAR | Status: DC | PRN

## 2023-09-24 MED ORDER — LIDOCAINE-PRILOCAINE 2.5-2.5 % EX CREA
1.0000 | TOPICAL_CREAM | CUTANEOUS | Status: DC
  Filled 2023-09-24: qty 5

## 2023-09-24 MED ORDER — HYDRALAZINE HCL 20 MG/ML IJ SOLN: 5.0000 mg | INTRAMUSCULAR | Status: DC | PRN

## 2023-09-24 MED ORDER — FENTANYL CITRATE (PF) 100 MCG/2ML IJ SOLN
INTRAMUSCULAR | Status: AC
  Filled 2023-09-24: qty 2

## 2023-09-24 MED ORDER — SULFAMETHOXAZOLE-TRIMETHOPRIM 400-80 MG PO TABS
1.0000 | ORAL_TABLET | Freq: Every day | ORAL | Status: DC
  Administered 2023-09-24: 1 via ORAL
  Filled 2023-09-24: qty 1

## 2023-09-24 MED ORDER — PHENYLEPHRINE 80 MCG/ML (10ML) SYRINGE FOR IV PUSH (FOR BLOOD PRESSURE SUPPORT)
PREFILLED_SYRINGE | INTRAVENOUS | Status: DC | PRN
Start: 2023-09-24 — End: 2023-09-24
  Administered 2023-09-24: 80 ug via INTRAVENOUS
  Administered 2023-09-24: 160 ug via INTRAVENOUS
  Administered 2023-09-24: 80 ug via INTRAVENOUS

## 2023-09-24 MED ORDER — NEOSTIGMINE METHYLSULFATE 10 MG/10ML IV SOLN
INTRAVENOUS | Status: DC | PRN
  Administered 2023-09-24: 1 mg via INTRAVENOUS
  Administered 2023-09-24: 2 mg via INTRAVENOUS

## 2023-09-24 MED ORDER — LIDOCAINE 2% (20 MG/ML) 5 ML SYRINGE
INTRAMUSCULAR | Status: AC
  Filled 2023-09-24: qty 5

## 2023-09-24 MED ORDER — ONDANSETRON HCL 4 MG/2ML IJ SOLN: 4.0000 mg | Freq: Four times a day (QID) | INTRAMUSCULAR | Status: DC | PRN

## 2023-09-24 MED ORDER — ROCURONIUM BROMIDE 10 MG/ML (PF) SYRINGE
PREFILLED_SYRINGE | INTRAVENOUS | Status: AC
  Filled 2023-09-24: qty 10

## 2023-09-24 MED ORDER — LIDOCAINE HCL (CARDIAC) PF 100 MG/5ML IV SOSY
PREFILLED_SYRINGE | INTRAVENOUS | Status: DC | PRN
  Administered 2023-09-24: 50 mg via INTRATRACHEAL

## 2023-09-24 MED ORDER — SODIUM CHLORIDE 0.9 % IV SOLN: INTRAVENOUS | Status: DC

## 2023-09-24 MED ORDER — DOCUSATE SODIUM 100 MG PO CAPS
100.0000 mg | ORAL_CAPSULE | Freq: Two times a day (BID) | ORAL | Status: DC
  Administered 2023-09-24 – 2023-09-25 (×2): 100 mg via ORAL
  Filled 2023-09-24 (×2): qty 1

## 2023-09-24 MED ORDER — SODIUM CHLORIDE 0.9% FLUSH: 3.0000 mL | INTRAVENOUS | Status: DC | PRN

## 2023-09-24 MED ORDER — PANTOPRAZOLE SODIUM 40 MG PO TBEC
40.0000 mg | DELAYED_RELEASE_TABLET | Freq: Every day | ORAL | Status: DC
  Administered 2023-09-24 – 2023-09-25 (×2): 40 mg via ORAL
  Filled 2023-09-24 (×2): qty 1

## 2023-09-24 MED ORDER — POTASSIUM CHLORIDE CRYS ER 20 MEQ PO TBCR: 20.0000 meq | EXTENDED_RELEASE_TABLET | Freq: Once | ORAL | Status: DC

## 2023-09-24 MED ORDER — EPHEDRINE SULFATE-NACL 50-0.9 MG/10ML-% IV SOSY
PREFILLED_SYRINGE | INTRAVENOUS | Status: DC | PRN
  Administered 2023-09-24: 10 mg via INTRAVENOUS
  Administered 2023-09-24: 5 mg via INTRAVENOUS

## 2023-09-24 MED ORDER — HEPARIN 6000 UNIT IRRIGATION SOLUTION
Status: DC | PRN
  Administered 2023-09-24: 1

## 2023-09-24 MED ORDER — DEXAMETHASONE SODIUM PHOSPHATE 10 MG/ML IJ SOLN
INTRAMUSCULAR | Status: DC | PRN
  Administered 2023-09-24: 5 mg via INTRAVENOUS

## 2023-09-24 MED ORDER — OXYCODONE HCL 5 MG/5ML PO SOLN: 5.0000 mg | Freq: Once | ORAL | Status: DC | PRN

## 2023-09-24 MED ORDER — ORAL CARE MOUTH RINSE: 15.0000 mL | Freq: Once | OROMUCOSAL | Status: AC

## 2023-09-24 MED ORDER — CITALOPRAM HYDROBROMIDE 10 MG PO TABS
10.0000 mg | ORAL_TABLET | Freq: Every day | ORAL | Status: DC
  Administered 2023-09-25: 10 mg via ORAL
  Filled 2023-09-24: qty 1

## 2023-09-24 MED ORDER — METOPROLOL TARTRATE 5 MG/5ML IV SOLN: 2.0000 mg | INTRAVENOUS | Status: DC | PRN

## 2023-09-24 MED ORDER — FENTANYL CITRATE (PF) 100 MCG/2ML IJ SOLN
25.0000 ug | INTRAMUSCULAR | Status: DC | PRN
  Administered 2023-09-24: 25 ug via INTRAVENOUS

## 2023-09-24 SURGICAL SUPPLY — 49 items
BANDAGE ESMARK 6X9 LF (GAUZE/BANDAGES/DRESSINGS) IMPLANT
BNDG ELASTIC 4INX 5YD STR LF (GAUZE/BANDAGES/DRESSINGS) IMPLANT
BNDG ESMARK 6X9 LF (GAUZE/BANDAGES/DRESSINGS) IMPLANT
BNDG GAUZE DERMACEA FLUFF 4 (GAUZE/BANDAGES/DRESSINGS) IMPLANT
CANISTER SUCT 1200ML W/VALVE (MISCELLANEOUS) ×2 IMPLANT
CLIP LIGATING EXTRA MED SLVR (CLIP) ×2 IMPLANT
CLIP LIGATING EXTRA SM BLUE (MISCELLANEOUS) ×2 IMPLANT
CNTNR URN SCR LID CUP LEK RST (MISCELLANEOUS) IMPLANT
CORONARY SUCKER SOFT TIP 10052 (MISCELLANEOUS) IMPLANT
CUFF TOURN SGL QUICK 42 (TOURNIQUET CUFF) IMPLANT
CUFF TRNQT CYL 24X4X16.5-23 (TOURNIQUET CUFF) IMPLANT
CUFF TRNQT CYL 34X4.125X (TOURNIQUET CUFF) IMPLANT
DERMABOND ADVANCED .7 DNX12 (GAUZE/BANDAGES/DRESSINGS) IMPLANT
DRAPE X-RAY CASS 24X20 (DRAPES) IMPLANT
ELECT REM PT RETURN 9FT ADLT (ELECTROSURGICAL) ×1 IMPLANT
ELECTRODE REM PT RTRN 9FT ADLT (ELECTROSURGICAL) ×2 IMPLANT
EVACUATOR SILICONE 100CC (DRAIN) IMPLANT
GAUZE SPONGE 4X4 12PLY STRL (GAUZE/BANDAGES/DRESSINGS) ×2 IMPLANT
GLOVE BIO SURGEON STRL SZ7.5 (GLOVE) ×2 IMPLANT
GOWN STRL REUS W/ TWL LRG LVL3 (GOWN DISPOSABLE) ×6 IMPLANT
KIT BASIN OR (CUSTOM PROCEDURE TRAY) ×2 IMPLANT
KIT TURNOVER KIT B (KITS) ×2 IMPLANT
NS IRRIG 1000ML POUR BTL (IV SOLUTION) ×4 IMPLANT
PACK PERIPHERAL VASCULAR (CUSTOM PROCEDURE TRAY) ×2 IMPLANT
PAD ARMBOARD POSITIONER FOAM (MISCELLANEOUS) ×4 IMPLANT
POWDER SURGICEL 3.0 GRAM (HEMOSTASIS) IMPLANT
SET COLLECT BLD 21X3/4 12 (NEEDLE) IMPLANT
SPIKE FLUID TRANSFER (MISCELLANEOUS) IMPLANT
SPONGE SURGIFOAM ABS GEL 100 (HEMOSTASIS) IMPLANT
SPONGE T-LAP 18X18 ~~LOC~~+RFID (SPONGE) IMPLANT
STAPLER SKIN PROX 35W (STAPLE) IMPLANT
STAPLER SKIN PROX WIDE 3.9 (STAPLE) IMPLANT
STOPCOCK 4 WAY LG BORE MALE ST (IV SETS) IMPLANT
SUT PROLENE 5 0 C 1 24 (SUTURE) ×2 IMPLANT
SUT PROLENE 6 0 BV (SUTURE) IMPLANT
SUT PROLENE 6 0 CC (SUTURE) ×2 IMPLANT
SUT PROLENE 7 0 BV 1 (SUTURE) IMPLANT
SUT PROLENE 7 0 BV1 MDA (SUTURE) IMPLANT
SUT SILK 2 0 SH (SUTURE) ×2 IMPLANT
SUT SILK 3-0 18XBRD TIE 12 (SUTURE) IMPLANT
SUT VIC AB 2-0 CTX 36 (SUTURE) ×4 IMPLANT
SUT VIC AB 3-0 SH 27X BRD (SUTURE) ×4 IMPLANT
SUT VIC AB 4-0 PS2 27 (SUTURE) IMPLANT
TAPE UMBILICAL 1/8X30 (MISCELLANEOUS) IMPLANT
TOWEL GREEN STERILE FF (TOWEL DISPOSABLE) ×2 IMPLANT
TRAY FOLEY MTR SLVR 16FR STAT (SET/KITS/TRAYS/PACK) ×2 IMPLANT
TUBING EXTENTION W/L.L. (IV SETS) IMPLANT
UNDERPAD 30X36 HEAVY ABSORB (UNDERPADS AND DIAPERS) ×2 IMPLANT
WATER STERILE IRR 1000ML POUR (IV SOLUTION) ×2 IMPLANT

## 2023-09-24 NOTE — Anesthesia Procedure Notes (Signed)
 Procedure Name: Intubation Date/Time: 09/24/2023 9:04 AM  Performed by: Colbert Coyer, CRNAPre-anesthesia Checklist: Patient identified, Emergency Drugs available, Suction available and Patient being monitored Patient Re-evaluated:Patient Re-evaluated prior to induction Oxygen Delivery Method: Circle System Utilized Preoxygenation: Pre-oxygenation with 100% oxygen Induction Type: IV induction Ventilation: Mask ventilation without difficulty Laryngoscope Size: Mac and 4 Grade View: Grade I Tube type: Oral Number of attempts: 1 Airway Equipment and Method: Stylet Placement Confirmation: ETT inserted through vocal cords under direct vision, positive ETCO2 and breath sounds checked- equal and bilateral Secured at: 21 cm Tube secured with: Tape Dental Injury: Teeth and Oropharynx as per pre-operative assessment

## 2023-09-24 NOTE — Progress Notes (Signed)
 PHARMACY NOTE:  ANTIMICROBIAL RENAL DOSAGE ADJUSTMENT  Current antimicrobial regimen includes a mismatch between antimicrobial dosage and estimated renal function.  As per policy approved by the Pharmacy & Therapeutics and Medical Executive Committees, the antimicrobial dosage will be adjusted accordingly.  Current antimicrobial dosage:  Bactrim DS 1 tab BID, 7 day course prescribed outpatient, patient has taken 2 doses so far  Indication: wound infection  Renal Function:  Estimated Creatinine Clearance: 4.9 mL/min (A) (by C-G formula based on SCr of 9.9 mg/dL (H)). [x]      On intermittent HD, scheduled:  []      On CRRT    Antimicrobial dosage has been changed to:  1 SS tab daily to complete 7 day course prescribed outpatient  Additional comments:   Thank you for allowing pharmacy to be a part of this patient's care.  Loralee Pacas, PharmD, BCPS 09/24/2023 8:56 PM

## 2023-09-24 NOTE — Transfer of Care (Signed)
 Immediate Anesthesia Transfer of Care Note  Patient: [name] [name]  Procedure(s) Performed: REMOVAL, GRAFT, BRACHIAL ARTERY PATCH (Left)  Patient Location: PACU  Anesthesia Type:General  Level of Consciousness: awake, alert , and patient cooperative  Airway & Oxygen Therapy: Patient Spontanous Breathing and Patient connected to nasal cannula oxygen  Post-op Assessment: Report given to RN and Post -op Vital signs reviewed and stable  Post vital signs: Reviewed and stable  Last Vitals:  Vitals Value Taken Time  BP 108/63 09/24/23 1036  Temp 97.8   Pulse 89 09/24/23 1041  Resp 13 09/24/23 1041  SpO2 97 % 09/24/23 1041  Vitals shown include unfiled device data.  Last Pain:  Vitals:   09/24/23 0736  TempSrc:   PainSc: 0-No pain         Complications: No notable events documented.

## 2023-09-24 NOTE — Plan of Care (Signed)

## 2023-09-24 NOTE — Interval H&P Note (Signed)
 History and Physical Interval Note:  09/24/2023 7:23 AM  [name] [name]  has presented today for surgery, with the diagnosis of INFECTED AV GRAFT.  The various methods of treatment have been discussed with the patient and family. After consideration of risks, benefits and other options for treatment, the patient has consented to  Procedure(s) with comments: REMOVAL, GRAFT (Left) - Left arm arteriovenous graft excision as a surgical intervention.  The patient's history has been reviewed, patient examined, no change in status, stable for surgery.  I have reviewed the patient's chart and labs.  Questions were answered to the patient's satisfaction.     [name]

## 2023-09-24 NOTE — Progress Notes (Signed)
 Contacted by vascular provider this morning regarding pt's need for HD tomorrow but pt staying overnight tonight (pt to d/c in the am if stable). Contacted [facility] to inquire if clinic has a 2nd shift appt available tomorrow. Pt can arrive at 11:15 for 11:35 chair time. Contacted PACU RN who assisted in speaking with pt regarding possible out-pt HD appt tomorrow. Pt agreeable to appt and states that grandson Marcelle Overlie [phone]) may be able to assist with transportation from hospital to clinic then clinic to home after treatment completed. Pt agreeable to navigator calling grandson to see if he can assist with transportation. Navigator spoke to grandson via phone. Grandson agreeable to assist pt with transportation to clinic from hospital and then clinic to home. Clinic's name,address, phone number, and appt times text to pt's grandson per his request. Lucila Maine also advised pt should be ready to be picked up at clinic around 3:00 per clinic staff. Charge RN at clinic aware to expect pt tomorrow and family to transport. Appt added to AVS. Update provided to vascular provider, nephrologist, and renal PA. PACU RN also aware of above arrangements and to provide to pt and next RN in report. Will assist as needed.   Olivia Canter Renal Navigator [phone]

## 2023-09-24 NOTE — Op Note (Signed)
 Patient name: Kyaira Trantham MRN: [id number] DOB: Aug 21, 1950 Sex: female  09/24/2023 Pre-operative Diagnosis: End-stage renal disease, infected/thrombosed left arm AV graft Post-operative diagnosis:  Same Surgeon:  Luanna Salk. Randie Heinz, MD Assistant: Clinton Gallant, PA Procedure Performed: 1.  Excision left arm AV graft and Viabahn stent 2.  Ligation of left axillary vein 3.  Harvest left basilic vein near antecubitum 4.  Patch angioplasty left brachial artery  Indications: 73 year old female on dialysis via tunneled dialysis catheter.  She has undergone graft placement 2 months ago and just about 1 month ago underwent declot of the left arm AV graft with stenting of the venous anastomosis.  She presented to the office with exposed graft in the mid upper arm by definition infected and was initiated on antibiotics and now presents for excision of graft.  Experience assistant was necessary to facilitate exposure of the arterial and venous anastomosis as well as excision of the graft with patch angioplasty of the brachial artery and closure of 3 wounds.  Findings: There was frank pus within the graft.  There was a Viabahn stent that was exposed in the axilla and this was removed.  The axillary vein was oversewn with 5-0 Prolene suture.  The basilic vein which was diminutive and not suitable for fistula creation was harvested and opened uses a patch angioplasty of the brachial artery and a completion there was a palpable radial artery pulse at the wrist.   Procedure:  The patient was identified in the holding area and taken to the operating room where she was placed upon the operative table and LMA anesthesia was induced.  She was sterilely prepped and draped in the left upper extremity in usual fashion, antibiotics were administered and a timeout was called.  We began opening the incision near the antecubitum and dissected down to expose the brachial artery proximally and distally to graft and encircled  these with Vesseloops.  We turned our attention to the axilla we dissected down identified the graft and then the stent was actually exposed leading into the axillary vein.  We were able to get control of the axillary vein centrally pending clamped this and the vein and the graft were removed and there was frank pus.  The stent was sent as a specimen for culture.  I thoroughly irrigated the axillary vein and then oversewed this with 5-0 Prolene suture.  I turned the attention back to the antecubitum.  I clamped the brachial artery proximally and distally and then transected the graft at the anastomosis.  The graft was then fully removed as it was not well incorporated in the mid segment.  I harvested the basilic vein from the same incision for approximately 3 cm and this was spatulated.  I was flushed distally and proximally in the brachial artery with heparinized saline.  The basilic vein was then sewn as a patch angioplasty with 6-0 Prolene suture.  Prior completion we allowed flushing in all directions and then there was a palpable radial pulse after completing the anastomosis.  I then thoroughly irrigated all the wounds.  The skin in the midsegment of the upper arm of the graft was exposed was then excised and we thoroughly irrigated the tunnel tract as well.  The mid segment was then closed primarily with staples.  The axillary and antecubital incisions were closed with 3-0 Vicryl followed by staples.  An Ace wrap was placed above the arm for gentle compression.  The patient was then awakened from anesthesia having tolerated the  procedure without any complication.  All counts were correct at completion.  EBL: 50 cc  Specimen: Stent and graft for culture    Jensyn Cambria C. Randie Heinz, MD [facility] Office: [phone] Pager: [phone]

## 2023-09-24 NOTE — Anesthesia Postprocedure Evaluation (Signed)
 Anesthesia Post Note  Patient: [name] [name]  Procedure(s) Performed: REMOVAL, GRAFT, BRACHIAL ARTERY PATCH (Left)     Patient location during evaluation: PACU Anesthesia Type: General Level of consciousness: sedated and patient cooperative Pain management: pain level controlled Vital Signs Assessment: post-procedure vital signs reviewed and stable Respiratory status: spontaneous breathing Cardiovascular status: stable Anesthetic complications: no   No notable events documented.  Last Vitals:  Vitals:   09/24/23 1245 09/24/23 1300  BP: (!) 90/52 (!) 91/53  Pulse: 76 81  Resp: 10 11  Temp:  36.6 C  SpO2: 93% 92%    Last Pain:  Vitals:   09/24/23 1230  TempSrc:   PainSc: Asleep                 Lewie Loron

## 2023-09-25 ENCOUNTER — Other Ambulatory Visit (HOSPITAL_COMMUNITY): Payer: Self-pay

## 2023-09-25 ENCOUNTER — Encounter (HOSPITAL_COMMUNITY): Payer: Self-pay | Admitting: Vascular Surgery

## 2023-09-25 DIAGNOSIS — D689 Coagulation defect, unspecified: Secondary | ICD-10-CM | POA: Diagnosis not present

## 2023-09-25 DIAGNOSIS — N2581 Secondary hyperparathyroidism of renal origin: Secondary | ICD-10-CM | POA: Diagnosis not present

## 2023-09-25 DIAGNOSIS — T8249XA Other complication of vascular dialysis catheter, initial encounter: Secondary | ICD-10-CM | POA: Diagnosis not present

## 2023-09-25 DIAGNOSIS — N186 End stage renal disease: Secondary | ICD-10-CM | POA: Diagnosis not present

## 2023-09-25 DIAGNOSIS — A499 Bacterial infection, unspecified: Secondary | ICD-10-CM | POA: Diagnosis not present

## 2023-09-25 DIAGNOSIS — Z992 Dependence on renal dialysis: Secondary | ICD-10-CM | POA: Diagnosis not present

## 2023-09-25 DIAGNOSIS — R52 Pain, unspecified: Secondary | ICD-10-CM | POA: Diagnosis not present

## 2023-09-25 DIAGNOSIS — D631 Anemia in chronic kidney disease: Secondary | ICD-10-CM | POA: Diagnosis not present

## 2023-09-25 DIAGNOSIS — R7881 Bacteremia: Secondary | ICD-10-CM | POA: Diagnosis not present

## 2023-09-25 DIAGNOSIS — D688 Other specified coagulation defects: Secondary | ICD-10-CM | POA: Diagnosis not present

## 2023-09-25 MED ORDER — HYDROCODONE-ACETAMINOPHEN 5-325 MG PO TABS
1.0000 | ORAL_TABLET | Freq: Four times a day (QID) | ORAL | 0 refills | Status: DC | PRN
Start: 2023-09-25 — End: 2023-10-13
  Filled 2023-09-25: qty 15, 2d supply, fill #0

## 2023-09-25 MED ORDER — ORAL CARE MOUTH RINSE: 15.0000 mL | OROMUCOSAL | Status: DC | PRN

## 2023-09-25 MED ORDER — SULFAMETHOXAZOLE-TRIMETHOPRIM 400-80 MG PO TABS
1.0000 | ORAL_TABLET | Freq: Every day | ORAL | 0 refills | Status: DC
  Filled 2023-09-25: qty 5, 5d supply, fill #0

## 2023-09-25 NOTE — Discharge Instructions (Signed)
   [facility]  Discharge Instructions  AV Fistula or Graft Surgery for Dialysis Access  Please refer to the following instructions for your post-procedure care. Your surgeon or physician assistant will discuss any changes with you.  Activity  You may drive the day following your surgery, if you are comfortable and no longer taking prescription pain medication. Resume full activity as the soreness in your incision resolves.  Bathing/Showering  You may shower after you go home. Keep your incision dry for 48 hours. Do not soak in a bathtub, hot tub, or swim until the incision heals completely. You may not shower if you have a hemodialysis catheter.  Incision Care  Clean your incision with mild soap and water after 48 hours. Pat the area dry with a clean towel. You do not need a bandage unless otherwise instructed. Do not apply any ointments or creams to your incision. You may have skin glue on your incision. Do not peel it off. It will come off on its own in about one week. Your arm may swell a bit after surgery. To reduce swelling use pillows to elevate your arm so it is above your heart. Your doctor will tell you if you need to lightly wrap your arm with an ACE bandage.  Diet  Resume your normal diet. There are not special food restrictions following this procedure. In order to heal from your surgery, it is CRITICAL to get adequate nutrition. Your body requires vitamins, minerals, and protein. Vegetables are the best source of vitamins and minerals. Vegetables also provide the perfect balance of protein. Processed food has little nutritional value, so try to avoid this.  Medications  Resume taking all of your medications. If your incision is causing pain, you may take over-the counter pain relievers such as acetaminophen (Tylenol). If you were prescribed a stronger pain medication, please be aware these medications can cause nausea and constipation. Prevent  nausea by taking the medication with a snack or meal. Avoid constipation by drinking plenty of fluids and eating foods with high amount of fiber, such as fruits, vegetables, and grains.  Do not take Tylenol if you are taking prescription pain medications.  Follow up Your surgeon may want to see you in the office following your access surgery. If so, this will be arranged at the time of your surgery.  Please call us immediately for any of the following conditions:  Increased pain, redness, drainage (pus) from your incision site Fever of 101 degrees or higher Severe or worsening pain at your incision site Hand pain or numbness.  Reduce your risk of vascular disease:  Stop smoking. If you would like help, call [facility] at 1-800-QUIT-NOW (949-608-8898) or [facility] at 734-600-5424  Manage your cholesterol Maintain a desired weight Control your diabetes Keep your blood pressure down  Dialysis  You can continue to use your Permcath for dialysis   09/25/2023 [name] [name] [id number] [birth year]  Surgeon(s): Maeola Harman, MD  Procedure(s): REMOVAL, GRAFT, BRACHIAL ARTERY PATCH   If you have any questions, please call the office at [phone].

## 2023-09-25 NOTE — Plan of Care (Signed)

## 2023-09-25 NOTE — Progress Notes (Signed)
 DISCHARGE NOTE HOME [name] [name] to be discharged Home per MD order. Discussed prescriptions and follow up appointments with the patient. Prescriptions given to patient; medication list explained in detail. Patient verbalized understanding.  Skin clean, dry and intact without evidence of skin break down, no evidence of skin tears noted. IV catheter discontinued intact. Site without signs and symptoms of complications. Dressing and pressure applied. Pt denies pain at the site currently. No complaints noted.  Patient free of lines, drains, and wounds.   An After Visit Summary (AVS) was printed and given to the patient. Patient escorted via wheelchair, and discharged home via private auto.  Margarita Grizzle, RN

## 2023-09-25 NOTE — Progress Notes (Signed)
  Progress Note    09/25/2023 9:29 AM 1 Day Post-Op  Subjective: No complaints today  Vitals:   09/25/23 0349 09/25/23 0844  BP: 100/62 90/60  Pulse: 90 86  Resp: 18   Temp: 98.1 F (36.7 C) 97.7 F (36.5 C)  SpO2: 96% 100%    Physical Exam: Awake alert oriented  Incisions healing well with staples and no evidence of infection  CBC    Component Value Date/Time   WBC 8.1 09/24/2023 1759   RBC 3.34 (L) 09/24/2023 1759   HGB 11.1 (L) 09/24/2023 1759   HGB 9.4 (L) 09/15/2019 1131   HCT 36.1 09/24/2023 1759   HCT 28.4 (L) 09/15/2019 1131   PLT 86 (L) 09/24/2023 1759   PLT 170 09/15/2019 1131   MCV 108.1 (H) 09/24/2023 1759   MCV 96 09/15/2019 1131   MCH 33.2 09/24/2023 1759   MCHC 30.7 09/24/2023 1759   RDW 18.6 (H) 09/24/2023 1759   RDW 13.2 09/15/2019 1131   LYMPHSABS 1.7 08/05/2023 1229   MONOABS 0.8 08/05/2023 1229   EOSABS 0.1 08/05/2023 1229   BASOSABS 0.0 08/05/2023 1229    BMET    Component Value Date/Time   NA 134 (L) 09/24/2023 1759   NA 143 03/25/2020 1112   K 5.0 09/24/2023 1759   CL 103 09/24/2023 1759   CO2 17 (L) 09/24/2023 1759   GLUCOSE 213 (H) 09/24/2023 1759   BUN 27 (H) 09/24/2023 1759   BUN 29 (H) 03/25/2020 1112   CREATININE 9.90 (H) 09/24/2023 1759   CREATININE 1.74 (H) 09/19/2014 1620   CALCIUM 8.4 (L) 09/24/2023 1759   CALCIUM 7.4 (L) 05/17/2013 1405   GFRNONAA 4 (L) 09/24/2023 1759   GFRNONAA 24 (L) 09/04/2011 1040   GFRAA 20 (L) 03/25/2020 1112   GFRAA 28 (L) 09/04/2011 1040    INR    Component Value Date/Time   INR 1.1 09/24/2023 1759     Intake/Output Summary (Last 24 hours) at 09/25/2023 0929 Last data filed at 09/25/2023 0853 Gross per 24 hour  Intake 780 ml  Output 100 ml  Net 680 ml     Assessment:  73 y.o. female is s/p excision left upper arm AV graft  Plan: Discharge today for outpatient dialysis session Plan follow-up in 2 weeks for wound check and possible staple removal   Eleanor Dimichele C. Randie Heinz,  MD [facility] Office: [phone] Pager: [phone]  09/25/2023 9:29 AM

## 2023-09-28 DIAGNOSIS — R7881 Bacteremia: Secondary | ICD-10-CM | POA: Diagnosis not present

## 2023-09-28 DIAGNOSIS — A499 Bacterial infection, unspecified: Secondary | ICD-10-CM | POA: Diagnosis not present

## 2023-09-28 DIAGNOSIS — N186 End stage renal disease: Secondary | ICD-10-CM | POA: Diagnosis not present

## 2023-09-28 DIAGNOSIS — D631 Anemia in chronic kidney disease: Secondary | ICD-10-CM | POA: Diagnosis not present

## 2023-09-28 DIAGNOSIS — D689 Coagulation defect, unspecified: Secondary | ICD-10-CM | POA: Diagnosis not present

## 2023-09-28 DIAGNOSIS — Z992 Dependence on renal dialysis: Secondary | ICD-10-CM | POA: Diagnosis not present

## 2023-09-28 DIAGNOSIS — R52 Pain, unspecified: Secondary | ICD-10-CM | POA: Diagnosis not present

## 2023-09-28 DIAGNOSIS — T8249XA Other complication of vascular dialysis catheter, initial encounter: Secondary | ICD-10-CM | POA: Diagnosis not present

## 2023-09-28 DIAGNOSIS — D688 Other specified coagulation defects: Secondary | ICD-10-CM | POA: Diagnosis not present

## 2023-09-28 DIAGNOSIS — N2581 Secondary hyperparathyroidism of renal origin: Secondary | ICD-10-CM | POA: Diagnosis not present

## 2023-09-29 LAB — AEROBIC/ANAEROBIC CULTURE W GRAM STAIN (SURGICAL/DEEP WOUND)

## 2023-09-29 NOTE — Discharge Summary (Signed)
 Discharge Summary  Patient ID: [name] [name] [id number] 72 y.o. [birth year]  Admit date: 09/24/2023  Discharge date and time: 09/25/2023  9:39 AM   Admitting Physician: Maeola Harman, MD   Discharge Physician: Maeola Harman, MD   Admission Diagnoses: Infection of AV graft for dialysis [facility]) [T82.7XXA] ESRD (end stage renal disease) (HCC) [N18.6]  Discharge Diagnoses:  Infection of AV graft for dialysis (HCC) [T82.7XXA] ESRD (end stage renal disease) (HCC) [N18.6]  Admission Condition: fair  Discharged Condition: good  Indication for Admission: 73 year old female on dialysis via tunneled dialysis catheter. She has undergone graft placement 2 months ago and just about 1 month ago underwent declot of the left arm AV graft with stenting of the venous anastomosis. She presented to the office with exposed graft in the mid upper arm by definition infected and was initiated on antibiotics and now presents for excision of graft.   Hospital Course: The patient was admitted to the hospital on 09/24/2023 and underwent the following surgery: 1) excision of left upper arm AV graft and Viabahn stent, 2) ligation of left axillary vein, 3) Harvest of left basilic vein near antecubitum, 4) patch angioplasty of left brachial artery.  She tolerated the procedure well was transferred to PACU in stable condition.  On POD 1 she was doing well.  She had very little pain in her left arm.  All of her incisions were dry and intact with staples.  She had no evidence of hematoma.   She was mobilizing without any issue and tolerating normal diet.  She was discharged on the morning of POD 1 so she could go to her outpatient dialysis session.  Consults: None  Treatments: IV hydration, antibiotics: Vancomycin and Bactrim, analgesia: acetaminophen and percocet, and surgery: Excision of left upper arm AV graft and Viabahn stent, ligation of left axillary vein, harvest of left basilic vein, and  patch angioplasty of left brachial artery on 09/24/2023  Discharge Exam: See progress note  Vitals:   09/25/23 0349 09/25/23 0844  BP: 100/62 90/60  Pulse: 90 86  Resp: 18   Temp: 98.1 F (36.7 C) 97.7 F (36.5 C)  SpO2: 96% 100%     Disposition: Discharge disposition: 01-Home or Self Care       Patient Instructions:  Allergies as of 09/25/2023       Reactions   Amoxicillin Anaphylaxis, Rash, Other (See Comments)   Throat Swelling   Aspirin Anaphylaxis, Itching, Rash, Other (See Comments)   Ciprofloxacin Swelling   Possible reaction to cipro or clindamycin 04/29/14 Possible reaction to cipro or clindamycin 04/29/14   Morphine And Codeine Anaphylaxis   Penicillins Anaphylaxis, Rash, Other (See Comments)   Throat Swelling   Gabapentin Other (See Comments)   Patient had one time seizure shortly after stopping gabapentin   Ace Inhibitors Other (See Comments), Cough   ACE possibly associated with cough and switched to ARB.  Would be OK to re-challenge if needed.        Medication List     STOP taking these medications    sulfamethoxazole-trimethoprim 800-160 MG tablet Commonly known as: BACTRIM DS Replaced by: sulfamethoxazole-trimethoprim 400-80 MG tablet       TAKE these medications    acetaminophen 500 MG tablet Commonly known as: TYLENOL Take 1,000 mg by mouth every 6 (six) hours as needed for mild pain (pain score 1-3) or moderate pain (pain score 4-6).   Ascorbic Acid 500 MG Caps Take 500 mg by mouth daily.   atorvastatin 40  MG tablet Commonly known as: LIPITOR Take 1 tablet (40 mg total) by mouth daily.   citalopram 10 MG tablet Commonly known as: CELEXA Take 1 tablet (10 mg total) by mouth daily.   Clear Eyes Adv Dry & Itchy Rlf 0.25 % Soln Generic drug: Glycerin Place 1 drop into both eyes daily as needed (Itching).   diclofenac Sodium 1 % Gel Commonly known as: VOLTAREN APPLY TWO GRAMS TOPICAALY FOUR TIMES DAILY What changed: See the  new instructions.   HYDROcodone-acetaminophen 5-325 MG tablet Commonly known as: NORCO/VICODIN Take 1-2 tablets by mouth every 6 (six) hours as needed (severe pain). What changed:  how much to take reasons to take this   lidocaine-prilocaine cream Commonly known as: EMLA Apply 1 Application topically Every Tuesday,Thursday,and Saturday with dialysis.   LORazepam 0.5 MG tablet Commonly known as: ATIVAN Take 1 tablet (0.5 mg total) by mouth 2 (two) times daily as needed. for anxiety   Mesalamine 800 MG Tbec Take 1 tablet (800 mg total) by mouth 3 (three) times daily.   midodrine 10 MG tablet Commonly known as: PROAMATINE Take 10 mg by mouth Every Tuesday,Thursday,and Saturday with dialysis.   mirtazapine 15 MG tablet Commonly known as: REMERON TAKE ONE TABLET BY MOUTH AT BEDTIME   pregabalin 25 MG capsule Commonly known as: LYRICA Take 1 capsule (25 mg total) by mouth 2 (two) times daily.   sevelamer carbonate 800 MG tablet Commonly known as: RENVELA Take 800 mg by mouth 3 (three) times daily with meals.   sulfamethoxazole-trimethoprim 400-80 MG tablet Commonly known as: BACTRIM Take 1 tablet by mouth at bedtime. Replaces: sulfamethoxazole-trimethoprim 800-160 MG tablet               Discharge Care Instructions  (From admission, onward)           Start     Ordered   09/25/23 0000  Discharge wound care:       Comments: Wash your incisions daily with soap and water, then pat dry. Do not put any ointments or creams on it. You can put dry bandages on your incisions as needed for drainage   09/25/23 1610           Activity: activity as tolerated, no driving while on analgesics, and no heavy lifting for 4 weeks Diet: low fat, low cholesterol diet and renal diet Wound Care: keep wound clean and dry  Follow-up with [facility] in 2 weeks.  SignedLoel Dubonnet, PA-C 09/29/2023 2:08 PM [facility] Office: [phone]

## 2023-09-30 DIAGNOSIS — N2581 Secondary hyperparathyroidism of renal origin: Secondary | ICD-10-CM | POA: Diagnosis not present

## 2023-09-30 DIAGNOSIS — A499 Bacterial infection, unspecified: Secondary | ICD-10-CM | POA: Diagnosis not present

## 2023-09-30 DIAGNOSIS — D631 Anemia in chronic kidney disease: Secondary | ICD-10-CM | POA: Diagnosis not present

## 2023-09-30 DIAGNOSIS — N186 End stage renal disease: Secondary | ICD-10-CM | POA: Diagnosis not present

## 2023-09-30 DIAGNOSIS — R7881 Bacteremia: Secondary | ICD-10-CM | POA: Diagnosis not present

## 2023-09-30 DIAGNOSIS — Z992 Dependence on renal dialysis: Secondary | ICD-10-CM | POA: Diagnosis not present

## 2023-09-30 DIAGNOSIS — T8249XA Other complication of vascular dialysis catheter, initial encounter: Secondary | ICD-10-CM | POA: Diagnosis not present

## 2023-09-30 DIAGNOSIS — R52 Pain, unspecified: Secondary | ICD-10-CM | POA: Diagnosis not present

## 2023-09-30 DIAGNOSIS — D688 Other specified coagulation defects: Secondary | ICD-10-CM | POA: Diagnosis not present

## 2023-09-30 DIAGNOSIS — D689 Coagulation defect, unspecified: Secondary | ICD-10-CM | POA: Diagnosis not present

## 2023-10-02 DIAGNOSIS — N186 End stage renal disease: Secondary | ICD-10-CM | POA: Diagnosis not present

## 2023-10-02 DIAGNOSIS — D631 Anemia in chronic kidney disease: Secondary | ICD-10-CM | POA: Diagnosis not present

## 2023-10-02 DIAGNOSIS — D688 Other specified coagulation defects: Secondary | ICD-10-CM | POA: Diagnosis not present

## 2023-10-02 DIAGNOSIS — D689 Coagulation defect, unspecified: Secondary | ICD-10-CM | POA: Diagnosis not present

## 2023-10-02 DIAGNOSIS — Z992 Dependence on renal dialysis: Secondary | ICD-10-CM | POA: Diagnosis not present

## 2023-10-02 DIAGNOSIS — T8249XA Other complication of vascular dialysis catheter, initial encounter: Secondary | ICD-10-CM | POA: Diagnosis not present

## 2023-10-02 DIAGNOSIS — R52 Pain, unspecified: Secondary | ICD-10-CM | POA: Diagnosis not present

## 2023-10-02 DIAGNOSIS — A499 Bacterial infection, unspecified: Secondary | ICD-10-CM | POA: Diagnosis not present

## 2023-10-02 DIAGNOSIS — R7881 Bacteremia: Secondary | ICD-10-CM | POA: Diagnosis not present

## 2023-10-02 DIAGNOSIS — N2581 Secondary hyperparathyroidism of renal origin: Secondary | ICD-10-CM | POA: Diagnosis not present

## 2023-10-04 DIAGNOSIS — Z992 Dependence on renal dialysis: Secondary | ICD-10-CM | POA: Diagnosis not present

## 2023-10-04 DIAGNOSIS — N186 End stage renal disease: Secondary | ICD-10-CM | POA: Diagnosis not present

## 2023-10-04 DIAGNOSIS — I129 Hypertensive chronic kidney disease with stage 1 through stage 4 chronic kidney disease, or unspecified chronic kidney disease: Secondary | ICD-10-CM | POA: Diagnosis not present

## 2023-10-05 DIAGNOSIS — D688 Other specified coagulation defects: Secondary | ICD-10-CM | POA: Diagnosis not present

## 2023-10-05 DIAGNOSIS — D631 Anemia in chronic kidney disease: Secondary | ICD-10-CM | POA: Diagnosis not present

## 2023-10-05 DIAGNOSIS — Z992 Dependence on renal dialysis: Secondary | ICD-10-CM | POA: Diagnosis not present

## 2023-10-05 DIAGNOSIS — R52 Pain, unspecified: Secondary | ICD-10-CM | POA: Diagnosis not present

## 2023-10-05 DIAGNOSIS — N186 End stage renal disease: Secondary | ICD-10-CM | POA: Diagnosis not present

## 2023-10-05 DIAGNOSIS — T8249XA Other complication of vascular dialysis catheter, initial encounter: Secondary | ICD-10-CM | POA: Diagnosis not present

## 2023-10-05 DIAGNOSIS — N2581 Secondary hyperparathyroidism of renal origin: Secondary | ICD-10-CM | POA: Diagnosis not present

## 2023-10-05 DIAGNOSIS — D689 Coagulation defect, unspecified: Secondary | ICD-10-CM | POA: Diagnosis not present

## 2023-10-07 DIAGNOSIS — N2581 Secondary hyperparathyroidism of renal origin: Secondary | ICD-10-CM | POA: Diagnosis not present

## 2023-10-07 DIAGNOSIS — N186 End stage renal disease: Secondary | ICD-10-CM | POA: Diagnosis not present

## 2023-10-07 DIAGNOSIS — D688 Other specified coagulation defects: Secondary | ICD-10-CM | POA: Diagnosis not present

## 2023-10-07 DIAGNOSIS — D631 Anemia in chronic kidney disease: Secondary | ICD-10-CM | POA: Diagnosis not present

## 2023-10-07 DIAGNOSIS — T8249XA Other complication of vascular dialysis catheter, initial encounter: Secondary | ICD-10-CM | POA: Diagnosis not present

## 2023-10-07 DIAGNOSIS — D689 Coagulation defect, unspecified: Secondary | ICD-10-CM | POA: Diagnosis not present

## 2023-10-07 DIAGNOSIS — R52 Pain, unspecified: Secondary | ICD-10-CM | POA: Diagnosis not present

## 2023-10-07 DIAGNOSIS — Z992 Dependence on renal dialysis: Secondary | ICD-10-CM | POA: Diagnosis not present

## 2023-10-09 DIAGNOSIS — D688 Other specified coagulation defects: Secondary | ICD-10-CM | POA: Diagnosis not present

## 2023-10-09 DIAGNOSIS — N186 End stage renal disease: Secondary | ICD-10-CM | POA: Diagnosis not present

## 2023-10-09 DIAGNOSIS — Z992 Dependence on renal dialysis: Secondary | ICD-10-CM | POA: Diagnosis not present

## 2023-10-09 DIAGNOSIS — T8249XA Other complication of vascular dialysis catheter, initial encounter: Secondary | ICD-10-CM | POA: Diagnosis not present

## 2023-10-09 DIAGNOSIS — N2581 Secondary hyperparathyroidism of renal origin: Secondary | ICD-10-CM | POA: Diagnosis not present

## 2023-10-09 DIAGNOSIS — D689 Coagulation defect, unspecified: Secondary | ICD-10-CM | POA: Diagnosis not present

## 2023-10-09 DIAGNOSIS — D631 Anemia in chronic kidney disease: Secondary | ICD-10-CM | POA: Diagnosis not present

## 2023-10-09 DIAGNOSIS — R52 Pain, unspecified: Secondary | ICD-10-CM | POA: Diagnosis not present

## 2023-10-12 DIAGNOSIS — R52 Pain, unspecified: Secondary | ICD-10-CM | POA: Diagnosis not present

## 2023-10-12 DIAGNOSIS — D688 Other specified coagulation defects: Secondary | ICD-10-CM | POA: Diagnosis not present

## 2023-10-12 DIAGNOSIS — Z992 Dependence on renal dialysis: Secondary | ICD-10-CM | POA: Diagnosis not present

## 2023-10-12 DIAGNOSIS — T8249XA Other complication of vascular dialysis catheter, initial encounter: Secondary | ICD-10-CM | POA: Diagnosis not present

## 2023-10-12 DIAGNOSIS — D631 Anemia in chronic kidney disease: Secondary | ICD-10-CM | POA: Diagnosis not present

## 2023-10-12 DIAGNOSIS — N186 End stage renal disease: Secondary | ICD-10-CM | POA: Diagnosis not present

## 2023-10-12 DIAGNOSIS — D689 Coagulation defect, unspecified: Secondary | ICD-10-CM | POA: Diagnosis not present

## 2023-10-12 DIAGNOSIS — N2581 Secondary hyperparathyroidism of renal origin: Secondary | ICD-10-CM | POA: Diagnosis not present

## 2023-10-13 ENCOUNTER — Ambulatory Visit (INDEPENDENT_AMBULATORY_CARE_PROVIDER_SITE_OTHER): Admitting: Physician Assistant

## 2023-10-13 VITALS — BP 98/61 | HR 95 | Temp 98.3°F | Ht 66.0 in | Wt 140.6 lb

## 2023-10-13 DIAGNOSIS — T827XXA Infection and inflammatory reaction due to other cardiac and vascular devices, implants and grafts, initial encounter: Secondary | ICD-10-CM

## 2023-10-13 NOTE — Progress Notes (Signed)
 POST OPERATIVE OFFICE NOTE    CC:  F/u for surgery  HPI: [name] [name] is a 73 y.o. female who is here for an incision check.  She recently underwent excision of an infected left upper arm AV graft and Viabahn stent, ligation of the left axillary vein, and vein patch angioplasty of the left brachial artery on 09/24/2023 by Dr. Vikki Graves.  Pt returns today for follow up.  Pt states she is feeling better after surgery.  She has completely finished her antibiotics course.  She denies any drainage from her incisions, fevers, or chills.  She states her arm is still a little sore, however it is greatly improved from before surgery.   Allergies  Allergen Reactions   Amoxicillin Anaphylaxis, Rash and Other (See Comments)    Throat Swelling   Aspirin Anaphylaxis, Itching, Rash and Other (See Comments)   Ciprofloxacin Swelling    Possible reaction to cipro or clindamycin 04/29/14 Possible reaction to cipro or clindamycin 04/29/14   Morphine And Codeine Anaphylaxis   Penicillins Anaphylaxis, Rash and Other (See Comments)    Throat Swelling   Gabapentin Other (See Comments)    Patient had one time seizure shortly after stopping gabapentin   Ace Inhibitors Other (See Comments) and Cough    ACE possibly associated with cough and switched to ARB.  Would be OK to re-challenge if needed.    Current Outpatient Medications  Medication Sig Dispense Refill   acetaminophen (TYLENOL) 500 MG tablet Take 1,000 mg by mouth every 6 (six) hours as needed for mild pain (pain score 1-3) or moderate pain (pain score 4-6).     Ascorbic Acid 500 MG CAPS Take 500 mg by mouth daily.     atorvastatin (LIPITOR) 40 MG tablet Take 1 tablet (40 mg total) by mouth daily. 90 tablet 3   citalopram (CELEXA) 10 MG tablet Take 1 tablet (10 mg total) by mouth daily. 90 tablet 3   diclofenac Sodium (VOLTAREN) 1 % GEL APPLY TWO GRAMS TOPICAALY FOUR TIMES DAILY (Patient taking differently: Apply 2 g topically daily as needed (sciatica).)  100 g 0   Glycerin (CLEAR EYES ADV DRY & ITCHY RLF) 0.25 % SOLN Place 1 drop into both eyes daily as needed (Itching).     HYDROcodone-acetaminophen (NORCO/VICODIN) 5-325 MG tablet Take 1-2 tablets by mouth every 6 (six) hours as needed (severe pain). 15 tablet 0   lidocaine-prilocaine (EMLA) cream Apply 1 Application topically Every Tuesday,Thursday,and Saturday with dialysis.     LORazepam (ATIVAN) 0.5 MG tablet Take 1 tablet (0.5 mg total) by mouth 2 (two) times daily as needed. for anxiety 60 tablet 0   Mesalamine 800 MG TBEC Take 1 tablet (800 mg total) by mouth 3 (three) times daily. 270 tablet 2   midodrine (PROAMATINE) 10 MG tablet Take 10 mg by mouth Every Tuesday,Thursday,and Saturday with dialysis.     mirtazapine (REMERON) 15 MG tablet TAKE ONE TABLET BY MOUTH AT BEDTIME 90 tablet 3   pregabalin (LYRICA) 25 MG capsule Take 1 capsule (25 mg total) by mouth 2 (two) times daily. 180 capsule 1   sevelamer carbonate (RENVELA) 800 MG tablet Take 800 mg by mouth 3 (three) times daily with meals.     sulfamethoxazole-trimethoprim (BACTRIM) 400-80 MG tablet Take 1 tablet by mouth at bedtime. 5 tablet 0   No current facility-administered medications for this visit.     ROS:  See HPI  Physical Exam:   Incision: Left upper arm incisions healing well without drainage or  dehiscence.  Every other staple removed Extremities: Palpable left radial pulse Neuro: Intact motor and sensation of left upper extremity     Assessment/Plan:  This is a 73 y.o. female who is here for postop check  -The patient recently underwent excision of an infected left upper arm AV graft and Viabahn stent with vein patch angioplasty of the left brachial artery -Her left upper arm incisions are healing well without further signs of infection.  I have removed every other staple today -I have encouraged her to keep her incisions clean and dry at home.  She can return to clinic in 1 week to remove the remainder of her  staples   Deneise Finlay, PA-C [facility] [phone]   Clinic MD: Vikki Graves

## 2023-10-14 ENCOUNTER — Emergency Department (HOSPITAL_COMMUNITY)
Admission: EM | Admit: 2023-10-14 | Discharge: 2023-10-14 | Disposition: A | Discharge status: Home / Self Care | Attending: Emergency Medicine | Admitting: Emergency Medicine

## 2023-10-14 ENCOUNTER — Emergency Department (HOSPITAL_COMMUNITY)

## 2023-10-14 ENCOUNTER — Other Ambulatory Visit: Payer: Self-pay

## 2023-10-14 ENCOUNTER — Encounter (HOSPITAL_COMMUNITY): Payer: Self-pay

## 2023-10-14 DIAGNOSIS — D688 Other specified coagulation defects: Secondary | ICD-10-CM | POA: Diagnosis not present

## 2023-10-14 DIAGNOSIS — D689 Coagulation defect, unspecified: Secondary | ICD-10-CM | POA: Diagnosis not present

## 2023-10-14 DIAGNOSIS — N186 End stage renal disease: Secondary | ICD-10-CM | POA: Insufficient documentation

## 2023-10-14 DIAGNOSIS — Z452 Encounter for adjustment and management of vascular access device: Secondary | ICD-10-CM | POA: Diagnosis not present

## 2023-10-14 DIAGNOSIS — I2510 Atherosclerotic heart disease of native coronary artery without angina pectoris: Secondary | ICD-10-CM | POA: Insufficient documentation

## 2023-10-14 DIAGNOSIS — R52 Pain, unspecified: Secondary | ICD-10-CM | POA: Diagnosis not present

## 2023-10-14 DIAGNOSIS — R079 Chest pain, unspecified: Secondary | ICD-10-CM | POA: Diagnosis not present

## 2023-10-14 DIAGNOSIS — N2581 Secondary hyperparathyroidism of renal origin: Secondary | ICD-10-CM | POA: Diagnosis not present

## 2023-10-14 DIAGNOSIS — R0789 Other chest pain: Secondary | ICD-10-CM | POA: Diagnosis not present

## 2023-10-14 DIAGNOSIS — D631 Anemia in chronic kidney disease: Secondary | ICD-10-CM | POA: Diagnosis not present

## 2023-10-14 DIAGNOSIS — I509 Heart failure, unspecified: Secondary | ICD-10-CM | POA: Diagnosis not present

## 2023-10-14 DIAGNOSIS — Z992 Dependence on renal dialysis: Secondary | ICD-10-CM | POA: Diagnosis not present

## 2023-10-14 DIAGNOSIS — J9811 Atelectasis: Secondary | ICD-10-CM | POA: Diagnosis not present

## 2023-10-14 DIAGNOSIS — T8249XA Other complication of vascular dialysis catheter, initial encounter: Secondary | ICD-10-CM | POA: Diagnosis not present

## 2023-10-14 LAB — CBC WITH DIFFERENTIAL/PLATELET
Abs Immature Granulocytes: 0.06 10*3/uL (ref 0.00–0.07)
Basophils Absolute: 0 10*3/uL (ref 0.0–0.1)
Basophils Relative: 0 %
Eosinophils Absolute: 0.2 10*3/uL (ref 0.0–0.5)
Eosinophils Relative: 3 %
HCT: 42 % (ref 36.0–46.0)
Hemoglobin: 13.4 g/dL (ref 12.0–15.0)
Immature Granulocytes: 1 %
Lymphocytes Relative: 25 %
Lymphs Abs: 1.7 10*3/uL (ref 0.7–4.0)
MCH: 32.8 pg (ref 26.0–34.0)
MCHC: 31.9 g/dL (ref 30.0–36.0)
MCV: 102.7 fL — ABNORMAL HIGH (ref 80.0–100.0)
Monocytes Absolute: 0.8 10*3/uL (ref 0.1–1.0)
Monocytes Relative: 11 %
Neutro Abs: 4.1 10*3/uL (ref 1.7–7.7)
Neutrophils Relative %: 60 %
Platelets: 80 10*3/uL — ABNORMAL LOW (ref 150–400)
RBC: 4.09 MIL/uL (ref 3.87–5.11)
RDW: 17.2 % — ABNORMAL HIGH (ref 11.5–15.5)
WBC: 6.9 10*3/uL (ref 4.0–10.5)
nRBC: 6.3 % — ABNORMAL HIGH (ref 0.0–0.2)

## 2023-10-14 LAB — BASIC METABOLIC PANEL WITH GFR
Anion gap: 15 (ref 5–15)
BUN: 10 mg/dL (ref 8–23)
CO2: 26 mmol/L (ref 22–32)
Calcium: 8.5 mg/dL — ABNORMAL LOW (ref 8.9–10.3)
Chloride: 95 mmol/L — ABNORMAL LOW (ref 98–111)
Creatinine, Ser: 5.29 mg/dL — ABNORMAL HIGH (ref 0.44–1.00)
GFR, Estimated: 8 mL/min — ABNORMAL LOW (ref >60)
Glucose, Bld: 84 mg/dL (ref 70–99)
Potassium: 3 mmol/L — ABNORMAL LOW (ref 3.5–5.1)
Sodium: 136 mmol/L (ref 135–145)

## 2023-10-14 LAB — TROPONIN I (HIGH SENSITIVITY)
Troponin I (High Sensitivity): 25 ng/L — ABNORMAL HIGH (ref <18)
Troponin I (High Sensitivity): 26 ng/L — ABNORMAL HIGH (ref <18)

## 2023-10-14 MED ORDER — ACETAMINOPHEN 500 MG PO TABS
1000.0000 mg | ORAL_TABLET | Freq: Once | ORAL | Status: AC
  Administered 2023-10-14: 1000 mg via ORAL
  Filled 2023-10-14: qty 2

## 2023-10-14 NOTE — ED Provider Notes (Signed)
 [place] EMERGENCY DEPARTMENT AT [facility] Provider Note   CSN: [id number] Arrival date & time: 10/14/23  1026     History  Chief Complaint  Patient presents with   Chest Pain    [name] [name] is a 73 y.o. female.  HPI 73 year old female presents with chest pain that started at dialysis.  This started around 10 AM and was a severe squeezing type pain in her left mid chest.  Does not seem to radiate.  No abdominal pain, vomiting.  Pain is better than when she was at first dialysis, was not 9 and now is down to a 6.  She has a history of CAD, Crohn's disease, colostomy, heart failure, ESRD on dialysis.  She states she got all but 35 minutes of dialysis today.  She has chronic back pain but no new or worsening back pain since this started.  Home Medications Prior to Admission medications   Medication Sig Start Date End Date Taking? Authorizing Provider  acetaminophen (TYLENOL) 500 MG tablet Take 1,000 mg by mouth every 6 (six) hours as needed for mild pain (pain score 1-3) or moderate pain (pain score 4-6).   Yes [provider]  atorvastatin (LIPITOR) 40 MG tablet Take 1 tablet (40 mg total) by mouth daily. 05/18/23  Yes Westley Chandler, MD  citalopram (CELEXA) 10 MG tablet Take 1 tablet (10 mg total) by mouth daily. 06/07/23  Yes Caro Laroche, DO  diclofenac Sodium (VOLTAREN) 1 % GEL APPLY TWO GRAMS TOPICAALY FOUR TIMES DAILY Patient taking differently: Apply 2 g topically daily as needed (sciatica). 06/22/23  Yes Caro Laroche, DO  Glycerin (CLEAR EYES ADV DRY & ITCHY RLF) 0.25 % SOLN Place 1 drop into both eyes daily as needed (Dry eye).   Yes [provider]  lidocaine-prilocaine (EMLA) cream Apply 1 Application topically Every Tuesday,Thursday,and Saturday with dialysis.   Yes [provider]  LORazepam (ATIVAN) 0.5 MG tablet Take 1 tablet (0.5 mg total) by mouth 2 (two) times daily as needed. for anxiety Patient taking differently:  Take 0.5 mg by mouth 2 (two) times daily as needed for anxiety. 07/19/23  Yes Caro Laroche, DO  Mesalamine 800 MG TBEC Take 1 tablet (800 mg total) by mouth 3 (three) times daily. 01/20/23  Yes Caro Laroche, DO  midodrine (PROAMATINE) 10 MG tablet Take 10 mg by mouth Every Tuesday,Thursday,and Saturday with dialysis.   Yes [provider]  mirtazapine (REMERON) 15 MG tablet TAKE ONE TABLET BY MOUTH AT BEDTIME 07/19/23  Yes Rumball, Darl Householder, DO  pregabalin (LYRICA) 25 MG capsule Take 1 capsule (25 mg total) by mouth 2 (two) times daily. 07/19/23  Yes Caro Laroche, DO  sevelamer carbonate (RENVELA) 800 MG tablet Take 1,600 mg by mouth 3 (three) times daily with meals.   Yes [provider]  Ascorbic Acid 500 MG CAPS Take 500 mg by mouth daily. Patient not taking: Reported on 10/14/2023 04/30/21   [provider]      Allergies    Aspirin, Beta-lactamase inhibitors (beta-lactam), Morphine and codeine, and Ace inhibitors    Review of Systems   Review of Systems  Respiratory:  Positive for shortness of breath.   Cardiovascular:  Positive for chest pain.  Gastrointestinal:  Negative for abdominal pain.    Physical Exam Updated Vital Signs BP 95/62   Pulse 71   Temp 97.8 F (36.6 C) (Oral)   Resp 14   Ht 5\' 6"  (1.676 m)  Wt 63.5 kg   SpO2 99%   BMI 22.60 kg/m  Physical Exam Vitals and nursing note reviewed.  Constitutional:      General: She is not in acute distress.    Appearance: She is well-developed. She is not ill-appearing or diaphoretic.  HENT:     Head: Normocephalic and atraumatic.  Cardiovascular:     Rate and Rhythm: Normal rate and regular rhythm.     Heart sounds: Normal heart sounds.  Pulmonary:     Effort: Pulmonary effort is normal.     Breath sounds: Normal breath sounds.  Abdominal:     Palpations: Abdomen is soft.     Tenderness: There is no abdominal tenderness.  Skin:    General: Skin is warm and dry.   Neurological:     Mental Status: She is alert.     ED Results / Procedures / Treatments   Labs (all labs ordered are listed, but only abnormal results are displayed) Labs Reviewed  CBC WITH DIFFERENTIAL/PLATELET - Abnormal; Notable for the following components:      Result Value   MCV 102.7 (*)    RDW 17.2 (*)    Platelets 80 (*)    nRBC 6.3 (*)    All other components within normal limits  BASIC METABOLIC PANEL WITH GFR - Abnormal; Notable for the following components:   Potassium 3.0 (*)    Chloride 95 (*)    Creatinine, Ser 5.29 (*)    Calcium 8.5 (*)    GFR, Estimated 8 (*)    All other components within normal limits  TROPONIN I (HIGH SENSITIVITY) - Abnormal; Notable for the following components:   Troponin I (High Sensitivity) 25 (*)    All other components within normal limits  TROPONIN I (HIGH SENSITIVITY) - Abnormal; Notable for the following components:   Troponin I (High Sensitivity) 26 (*)    All other components within normal limits    EKG EKG Interpretation Date/Time:  Thursday October 14 2023 10:41:17 EDT Ventricular Rate:  91 PR Interval:  168 QRS Duration:  102 QT Interval:  376 QTC Calculation: 462 R Axis:   34  Text Interpretation: Normal sinus rhythm Septal infarct , age undetermined When compared with ECG of 05-Aug-2023 11:21, PREVIOUS ECG IS PRESENT No significant change since last tracing Confirmed by Gwyneth Sprout ([id number]) on 10/14/2023 10:47:55 AM  Radiology DG Chest 2 View Result Date: 10/14/2023 CLINICAL DATA:  Chest pain EXAM: CHEST - 2 VIEW COMPARISON:  X-ray 02/28/2022. FINDINGS: Hyperinflation. There is some linear opacity left lung base likely scar or atelectasis. No consolidation, pneumothorax or effusion. No edema. Right IJ double-lumen catheter tip at the SVC right atrial junction. Normal cardiopericardial silhouette. Overlapping cardiac leads. Chronic lung changes. IMPRESSION: Right IJ double-lumen catheter in place. Hyperinflation  with chronic lung changes.  Basilar scar atelectasis. Electronically Signed   By: Karen Kays [name]   On: 10/14/2023 13:35    Procedures Procedures    Medications Ordered in ED Medications  acetaminophen (TYLENOL) tablet 1,000 mg (1,000 mg Oral Given 10/14/23 1222)    ED Course/ Medical Decision Making/ A&P                                 Medical Decision Making Amount and/or Complexity of Data Reviewed Labs: ordered.    Details: Mildly elevated troponins but flat Radiology: ordered and independent interpretation performed.    Details: No CHF ECG/medicine tests:  ordered and independent interpretation performed.    Details: No ischemia   Patient presents with nonspecific chest pain that she feels felt like gas.  It is slowly improving.  She is not hypoxic or tachycardic and my suspicion for PE is pretty low.  Troponins are slightly elevated but flat and similar to prior baseline.  Patient certainly has risk factors for cardiac disease.  I discussed that we could observe her in the hospital overnight for cardiac workup versus outpatient follow-up with cardiology.  She does not want to be admitted which I think is reasonable.  Discussed that this does not fully rule out heart disease but just an MI this morning.  She understands this and was given return precautions.  Of note she has chronic hypotension and was allowed to take her midodrine while she was here.  BP is typically in the 80s and 90s which has remained stable here.        Final Clinical Impression(s) / ED Diagnoses Final diagnoses:  Nonspecific chest pain    Rx / DC Orders ED Discharge Orders          Ordered    Ambulatory referral to Cardiology       Comments: If you have not heard from the Cardiology office within the next 72 hours please call [phone].   10/14/23 1530              Pricilla Loveless, MD 10/14/23 [phone]

## 2023-10-14 NOTE — ED Provider Triage Note (Signed)
 Emergency Medicine Provider Triage Evaluation Note  [name] [name] , a 73 y.o. female  was evaluated in triage.  Pt complains of chest pain that started today while she was at dialysis.  She reports she had 35 minutes ago when she developed this throbbing pain in her chest that is not going away.  She reports only having it once before but it was a while ago.  She is not having any new cough or congestion.  No swelling in her legs.  She has been compliant with her dialysis..  Review of Systems  Positive: Chest pain Negative: Fever, shortness of breath  Physical Exam  BP (!) 77/50 (BP Location: Right Arm)   Pulse 98   Temp 98.2 F (36.8 C) (Oral)   Resp 20   Ht 5\' 6"  (1.676 m)   Wt 63.5 kg   SpO2 97%   BMI 22.60 kg/m  Gen:   Awake, appears uncomfortable Resp:  Normal effort. Tunneling catheter in the chest.  Pain with palpation of the chest MSK:   Moves extremities without difficulty  Other:  No abd pain  Medical Decision Making  Medically screening exam initiated at 10:55 AM.  Appropriate orders placed.  Berania Peedin was informed that the remainder of the evaluation will be completed by another provider, this initial triage assessment does not replace that evaluation, and the importance of remaining in the ED until their evaluation is complete.     Gwyneth Sprout, MD 10/14/23 1057

## 2023-10-14 NOTE — Discharge Instructions (Signed)
 If you develop recurrent, continued, or worsening chest pain, shortness of breath, fever, vomiting, abdominal or back pain, or any other new/concerning symptoms then return to the ER for evaluation.

## 2023-10-14 NOTE — ED Triage Notes (Signed)
 Patient was at dialysis and started having central chest pain/epigastric and a lot of belching.  Ended 35 mins early.  Colostomy with a lot of gas.  NSR EKG BP 102/60 HR 88  97%RA CBG 103

## 2023-10-16 DIAGNOSIS — N186 End stage renal disease: Secondary | ICD-10-CM | POA: Diagnosis not present

## 2023-10-16 DIAGNOSIS — Z992 Dependence on renal dialysis: Secondary | ICD-10-CM | POA: Diagnosis not present

## 2023-10-16 DIAGNOSIS — N2581 Secondary hyperparathyroidism of renal origin: Secondary | ICD-10-CM | POA: Diagnosis not present

## 2023-10-16 DIAGNOSIS — D688 Other specified coagulation defects: Secondary | ICD-10-CM | POA: Diagnosis not present

## 2023-10-16 DIAGNOSIS — D689 Coagulation defect, unspecified: Secondary | ICD-10-CM | POA: Diagnosis not present

## 2023-10-16 DIAGNOSIS — R52 Pain, unspecified: Secondary | ICD-10-CM | POA: Diagnosis not present

## 2023-10-16 DIAGNOSIS — D631 Anemia in chronic kidney disease: Secondary | ICD-10-CM | POA: Diagnosis not present

## 2023-10-16 DIAGNOSIS — T8249XA Other complication of vascular dialysis catheter, initial encounter: Secondary | ICD-10-CM | POA: Diagnosis not present

## 2023-10-19 ENCOUNTER — Other Ambulatory Visit: Payer: Self-pay | Admitting: Family Medicine

## 2023-10-19 DIAGNOSIS — D688 Other specified coagulation defects: Secondary | ICD-10-CM | POA: Diagnosis not present

## 2023-10-19 DIAGNOSIS — Z992 Dependence on renal dialysis: Secondary | ICD-10-CM | POA: Diagnosis not present

## 2023-10-19 DIAGNOSIS — T8249XA Other complication of vascular dialysis catheter, initial encounter: Secondary | ICD-10-CM | POA: Diagnosis not present

## 2023-10-19 DIAGNOSIS — D689 Coagulation defect, unspecified: Secondary | ICD-10-CM | POA: Diagnosis not present

## 2023-10-19 DIAGNOSIS — N186 End stage renal disease: Secondary | ICD-10-CM | POA: Diagnosis not present

## 2023-10-19 DIAGNOSIS — R52 Pain, unspecified: Secondary | ICD-10-CM | POA: Diagnosis not present

## 2023-10-19 DIAGNOSIS — D631 Anemia in chronic kidney disease: Secondary | ICD-10-CM | POA: Diagnosis not present

## 2023-10-19 DIAGNOSIS — N2581 Secondary hyperparathyroidism of renal origin: Secondary | ICD-10-CM | POA: Diagnosis not present

## 2023-10-21 DIAGNOSIS — D688 Other specified coagulation defects: Secondary | ICD-10-CM | POA: Diagnosis not present

## 2023-10-21 DIAGNOSIS — N2581 Secondary hyperparathyroidism of renal origin: Secondary | ICD-10-CM | POA: Diagnosis not present

## 2023-10-21 DIAGNOSIS — R52 Pain, unspecified: Secondary | ICD-10-CM | POA: Diagnosis not present

## 2023-10-21 DIAGNOSIS — D689 Coagulation defect, unspecified: Secondary | ICD-10-CM | POA: Diagnosis not present

## 2023-10-21 DIAGNOSIS — T8249XA Other complication of vascular dialysis catheter, initial encounter: Secondary | ICD-10-CM | POA: Diagnosis not present

## 2023-10-21 DIAGNOSIS — D631 Anemia in chronic kidney disease: Secondary | ICD-10-CM | POA: Diagnosis not present

## 2023-10-21 DIAGNOSIS — N186 End stage renal disease: Secondary | ICD-10-CM | POA: Diagnosis not present

## 2023-10-21 DIAGNOSIS — Z992 Dependence on renal dialysis: Secondary | ICD-10-CM | POA: Diagnosis not present

## 2023-10-22 ENCOUNTER — Ambulatory Visit (INDEPENDENT_AMBULATORY_CARE_PROVIDER_SITE_OTHER): Admitting: Physician Assistant

## 2023-10-22 VITALS — BP 103/64 | HR 107 | Temp 97.8°F | Ht 66.0 in | Wt 137.5 lb

## 2023-10-22 DIAGNOSIS — N186 End stage renal disease: Secondary | ICD-10-CM

## 2023-10-23 DIAGNOSIS — D688 Other specified coagulation defects: Secondary | ICD-10-CM | POA: Diagnosis not present

## 2023-10-23 DIAGNOSIS — Z992 Dependence on renal dialysis: Secondary | ICD-10-CM | POA: Diagnosis not present

## 2023-10-23 DIAGNOSIS — N2581 Secondary hyperparathyroidism of renal origin: Secondary | ICD-10-CM | POA: Diagnosis not present

## 2023-10-23 DIAGNOSIS — T8249XA Other complication of vascular dialysis catheter, initial encounter: Secondary | ICD-10-CM | POA: Diagnosis not present

## 2023-10-23 DIAGNOSIS — D631 Anemia in chronic kidney disease: Secondary | ICD-10-CM | POA: Diagnosis not present

## 2023-10-23 DIAGNOSIS — D689 Coagulation defect, unspecified: Secondary | ICD-10-CM | POA: Diagnosis not present

## 2023-10-23 DIAGNOSIS — R52 Pain, unspecified: Secondary | ICD-10-CM | POA: Diagnosis not present

## 2023-10-23 DIAGNOSIS — N186 End stage renal disease: Secondary | ICD-10-CM | POA: Diagnosis not present

## 2023-10-23 NOTE — Progress Notes (Signed)
 POST OPERATIVE OFFICE NOTE    CC:  F/u for surgery  HPI:  [name] [name] is a 73 y.o. female who is here for a postop check.  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 Vikki Graves.  Pt returns today for repeat wound check.  She continues to feel much better after surgery.  Most of the soreness is gone in her left arm.  She denies any drainage from her incisions, fevers, or chills.  She has been cleaning her incisions daily.   Allergies  Allergen Reactions   Aspirin Anaphylaxis   Beta-Lactamase Inhibitors (Beta-Lactam) Anaphylaxis    Penicillin, Amoxicillin   Morphine  And Codeine Anaphylaxis   Ace Inhibitors Other (See Comments) and Cough    ACE possibly associated with cough and switched to ARB.  Would be OK to re-challenge if needed.    Current Outpatient Medications  Medication Sig Dispense Refill   acetaminophen  (TYLENOL ) 500 MG tablet Take 1,000 mg by mouth every 6 (six) hours as needed for mild pain (pain score 1-3) or moderate pain (pain score 4-6).     atorvastatin  (LIPITOR) 40 MG tablet Take 1 tablet (40 mg total) by mouth daily. 90 tablet 3   citalopram  (CELEXA ) 10 MG tablet Take 1 tablet (10 mg total) by mouth daily. 90 tablet 3   diclofenac  Sodium (VOLTAREN ) 1 % GEL APPLY TWO GRAMS TOPICAALY FOUR TIMES DAILY (Patient taking differently: Apply 2 g topically daily as needed (sciatica).) 100 g 0   Glycerin (CLEAR EYES ADV DRY & ITCHY RLF) 0.25 % SOLN Place 1 drop into both eyes daily as needed (Dry eye).     lidocaine -prilocaine  (EMLA ) cream Apply 1 Application topically Every Tuesday,Thursday,and Saturday with dialysis.     LORazepam  (ATIVAN ) 0.5 MG tablet Take 1 tablet (0.5 mg total) by mouth 2 (two) times daily as needed. for anxiety (Patient taking differently: Take 0.5 mg by mouth 2 (two) times daily as needed for anxiety.) 60  tablet 0   Mesalamine  800 MG TBEC Take 1 tablet (800 mg total) by mouth 3 (three) times daily. 270 tablet 2   midodrine  (PROAMATINE ) 10 MG tablet Take 10 mg by mouth Every Tuesday,Thursday,and Saturday with dialysis.     mirtazapine  (REMERON ) 15 MG tablet TAKE ONE TABLET BY MOUTH AT BEDTIME 90 tablet 3   pregabalin  (LYRICA ) 25 MG capsule Take 1 capsule (25 mg total) by mouth 2 (two) times daily. 180 capsule 1   sevelamer  carbonate (RENVELA ) 800 MG tablet Take 1,600 mg by mouth 3 (three) times daily with meals.     Ascorbic Acid  500 MG CAPS Take 500 mg by mouth daily. (Patient not taking: Reported on 10/22/2023)     No current facility-administered medications for this visit.     ROS:  See HPI  Physical Exam:  Incision: Left upper arm incisions well-healed without signs of infection.  Remainder of staples removed Extremities: Palpable left radial pulse Neuro: Intact motor and sensation of left upper extremity    Assessment/Plan:  This is a 73 y.o. female who is here for post op check  - The patient's left upper arm incisions are well-healed without signs of infection.  I removed the remainder of her staples today - Her left arm is warm and well-perfused with a palpable radial pulse.  She has intact motor and sensation of the left arm -  She will continue to be catheter dependent for dialysis for the foreseeable future.  She does not want any other access and likely has no options left in her arms. - She can follow-up with our office as needed   Deneise Finlay, PA-C [facility] [phone]   Clinic MD:  Susi Eric

## 2023-10-26 DIAGNOSIS — N2581 Secondary hyperparathyroidism of renal origin: Secondary | ICD-10-CM | POA: Diagnosis not present

## 2023-10-26 DIAGNOSIS — T8249XA Other complication of vascular dialysis catheter, initial encounter: Secondary | ICD-10-CM | POA: Diagnosis not present

## 2023-10-26 DIAGNOSIS — N186 End stage renal disease: Secondary | ICD-10-CM | POA: Diagnosis not present

## 2023-10-26 DIAGNOSIS — D689 Coagulation defect, unspecified: Secondary | ICD-10-CM | POA: Diagnosis not present

## 2023-10-26 DIAGNOSIS — D688 Other specified coagulation defects: Secondary | ICD-10-CM | POA: Diagnosis not present

## 2023-10-26 DIAGNOSIS — Z992 Dependence on renal dialysis: Secondary | ICD-10-CM | POA: Diagnosis not present

## 2023-10-26 DIAGNOSIS — D631 Anemia in chronic kidney disease: Secondary | ICD-10-CM | POA: Diagnosis not present

## 2023-10-26 DIAGNOSIS — R52 Pain, unspecified: Secondary | ICD-10-CM | POA: Diagnosis not present

## 2023-10-28 DIAGNOSIS — D689 Coagulation defect, unspecified: Secondary | ICD-10-CM | POA: Diagnosis not present

## 2023-10-28 DIAGNOSIS — T8249XA Other complication of vascular dialysis catheter, initial encounter: Secondary | ICD-10-CM | POA: Diagnosis not present

## 2023-10-28 DIAGNOSIS — N2581 Secondary hyperparathyroidism of renal origin: Secondary | ICD-10-CM | POA: Diagnosis not present

## 2023-10-28 DIAGNOSIS — D688 Other specified coagulation defects: Secondary | ICD-10-CM | POA: Diagnosis not present

## 2023-10-28 DIAGNOSIS — Z992 Dependence on renal dialysis: Secondary | ICD-10-CM | POA: Diagnosis not present

## 2023-10-28 DIAGNOSIS — N186 End stage renal disease: Secondary | ICD-10-CM | POA: Diagnosis not present

## 2023-10-28 DIAGNOSIS — R52 Pain, unspecified: Secondary | ICD-10-CM | POA: Diagnosis not present

## 2023-10-28 DIAGNOSIS — D631 Anemia in chronic kidney disease: Secondary | ICD-10-CM | POA: Diagnosis not present

## 2023-10-30 DIAGNOSIS — T8249XA Other complication of vascular dialysis catheter, initial encounter: Secondary | ICD-10-CM | POA: Diagnosis not present

## 2023-10-30 DIAGNOSIS — Z992 Dependence on renal dialysis: Secondary | ICD-10-CM | POA: Diagnosis not present

## 2023-10-30 DIAGNOSIS — N2581 Secondary hyperparathyroidism of renal origin: Secondary | ICD-10-CM | POA: Diagnosis not present

## 2023-10-30 DIAGNOSIS — D689 Coagulation defect, unspecified: Secondary | ICD-10-CM | POA: Diagnosis not present

## 2023-10-30 DIAGNOSIS — D631 Anemia in chronic kidney disease: Secondary | ICD-10-CM | POA: Diagnosis not present

## 2023-10-30 DIAGNOSIS — N186 End stage renal disease: Secondary | ICD-10-CM | POA: Diagnosis not present

## 2023-10-30 DIAGNOSIS — R52 Pain, unspecified: Secondary | ICD-10-CM | POA: Diagnosis not present

## 2023-10-30 DIAGNOSIS — D688 Other specified coagulation defects: Secondary | ICD-10-CM | POA: Diagnosis not present

## 2023-11-02 DIAGNOSIS — R52 Pain, unspecified: Secondary | ICD-10-CM | POA: Diagnosis not present

## 2023-11-02 DIAGNOSIS — D688 Other specified coagulation defects: Secondary | ICD-10-CM | POA: Diagnosis not present

## 2023-11-02 DIAGNOSIS — Z992 Dependence on renal dialysis: Secondary | ICD-10-CM | POA: Diagnosis not present

## 2023-11-02 DIAGNOSIS — N2581 Secondary hyperparathyroidism of renal origin: Secondary | ICD-10-CM | POA: Diagnosis not present

## 2023-11-02 DIAGNOSIS — D631 Anemia in chronic kidney disease: Secondary | ICD-10-CM | POA: Diagnosis not present

## 2023-11-02 DIAGNOSIS — T8249XA Other complication of vascular dialysis catheter, initial encounter: Secondary | ICD-10-CM | POA: Diagnosis not present

## 2023-11-02 DIAGNOSIS — D689 Coagulation defect, unspecified: Secondary | ICD-10-CM | POA: Diagnosis not present

## 2023-11-02 DIAGNOSIS — N186 End stage renal disease: Secondary | ICD-10-CM | POA: Diagnosis not present

## 2023-11-03 DIAGNOSIS — I129 Hypertensive chronic kidney disease with stage 1 through stage 4 chronic kidney disease, or unspecified chronic kidney disease: Secondary | ICD-10-CM | POA: Diagnosis not present

## 2023-11-03 DIAGNOSIS — Z992 Dependence on renal dialysis: Secondary | ICD-10-CM | POA: Diagnosis not present

## 2023-11-03 DIAGNOSIS — N186 End stage renal disease: Secondary | ICD-10-CM | POA: Diagnosis not present

## 2023-11-04 DIAGNOSIS — D631 Anemia in chronic kidney disease: Secondary | ICD-10-CM | POA: Diagnosis not present

## 2023-11-04 DIAGNOSIS — D688 Other specified coagulation defects: Secondary | ICD-10-CM | POA: Diagnosis not present

## 2023-11-04 DIAGNOSIS — N186 End stage renal disease: Secondary | ICD-10-CM | POA: Diagnosis not present

## 2023-11-04 DIAGNOSIS — R52 Pain, unspecified: Secondary | ICD-10-CM | POA: Diagnosis not present

## 2023-11-04 DIAGNOSIS — T8249XA Other complication of vascular dialysis catheter, initial encounter: Secondary | ICD-10-CM | POA: Diagnosis not present

## 2023-11-04 DIAGNOSIS — D689 Coagulation defect, unspecified: Secondary | ICD-10-CM | POA: Diagnosis not present

## 2023-11-04 DIAGNOSIS — Z992 Dependence on renal dialysis: Secondary | ICD-10-CM | POA: Diagnosis not present

## 2023-11-04 DIAGNOSIS — N2581 Secondary hyperparathyroidism of renal origin: Secondary | ICD-10-CM | POA: Diagnosis not present

## 2023-11-06 DIAGNOSIS — N2581 Secondary hyperparathyroidism of renal origin: Secondary | ICD-10-CM | POA: Diagnosis not present

## 2023-11-06 DIAGNOSIS — D631 Anemia in chronic kidney disease: Secondary | ICD-10-CM | POA: Diagnosis not present

## 2023-11-06 DIAGNOSIS — T8249XA Other complication of vascular dialysis catheter, initial encounter: Secondary | ICD-10-CM | POA: Diagnosis not present

## 2023-11-06 DIAGNOSIS — R52 Pain, unspecified: Secondary | ICD-10-CM | POA: Diagnosis not present

## 2023-11-06 DIAGNOSIS — D688 Other specified coagulation defects: Secondary | ICD-10-CM | POA: Diagnosis not present

## 2023-11-06 DIAGNOSIS — D689 Coagulation defect, unspecified: Secondary | ICD-10-CM | POA: Diagnosis not present

## 2023-11-06 DIAGNOSIS — N186 End stage renal disease: Secondary | ICD-10-CM | POA: Diagnosis not present

## 2023-11-06 DIAGNOSIS — Z992 Dependence on renal dialysis: Secondary | ICD-10-CM | POA: Diagnosis not present

## 2023-11-09 DIAGNOSIS — R52 Pain, unspecified: Secondary | ICD-10-CM | POA: Diagnosis not present

## 2023-11-09 DIAGNOSIS — D689 Coagulation defect, unspecified: Secondary | ICD-10-CM | POA: Diagnosis not present

## 2023-11-09 DIAGNOSIS — Z992 Dependence on renal dialysis: Secondary | ICD-10-CM | POA: Diagnosis not present

## 2023-11-09 DIAGNOSIS — T8249XA Other complication of vascular dialysis catheter, initial encounter: Secondary | ICD-10-CM | POA: Diagnosis not present

## 2023-11-09 DIAGNOSIS — N186 End stage renal disease: Secondary | ICD-10-CM | POA: Diagnosis not present

## 2023-11-09 DIAGNOSIS — D631 Anemia in chronic kidney disease: Secondary | ICD-10-CM | POA: Diagnosis not present

## 2023-11-09 DIAGNOSIS — N2581 Secondary hyperparathyroidism of renal origin: Secondary | ICD-10-CM | POA: Diagnosis not present

## 2023-11-09 DIAGNOSIS — D688 Other specified coagulation defects: Secondary | ICD-10-CM | POA: Diagnosis not present

## 2023-11-11 DIAGNOSIS — R52 Pain, unspecified: Secondary | ICD-10-CM | POA: Diagnosis not present

## 2023-11-11 DIAGNOSIS — D631 Anemia in chronic kidney disease: Secondary | ICD-10-CM | POA: Diagnosis not present

## 2023-11-11 DIAGNOSIS — D689 Coagulation defect, unspecified: Secondary | ICD-10-CM | POA: Diagnosis not present

## 2023-11-11 DIAGNOSIS — N2581 Secondary hyperparathyroidism of renal origin: Secondary | ICD-10-CM | POA: Diagnosis not present

## 2023-11-11 DIAGNOSIS — D688 Other specified coagulation defects: Secondary | ICD-10-CM | POA: Diagnosis not present

## 2023-11-11 DIAGNOSIS — Z992 Dependence on renal dialysis: Secondary | ICD-10-CM | POA: Diagnosis not present

## 2023-11-11 DIAGNOSIS — N186 End stage renal disease: Secondary | ICD-10-CM | POA: Diagnosis not present

## 2023-11-11 DIAGNOSIS — T8249XA Other complication of vascular dialysis catheter, initial encounter: Secondary | ICD-10-CM | POA: Diagnosis not present

## 2023-11-13 DIAGNOSIS — D631 Anemia in chronic kidney disease: Secondary | ICD-10-CM | POA: Diagnosis not present

## 2023-11-13 DIAGNOSIS — D689 Coagulation defect, unspecified: Secondary | ICD-10-CM | POA: Diagnosis not present

## 2023-11-13 DIAGNOSIS — R52 Pain, unspecified: Secondary | ICD-10-CM | POA: Diagnosis not present

## 2023-11-13 DIAGNOSIS — Z992 Dependence on renal dialysis: Secondary | ICD-10-CM | POA: Diagnosis not present

## 2023-11-13 DIAGNOSIS — T8249XA Other complication of vascular dialysis catheter, initial encounter: Secondary | ICD-10-CM | POA: Diagnosis not present

## 2023-11-13 DIAGNOSIS — D688 Other specified coagulation defects: Secondary | ICD-10-CM | POA: Diagnosis not present

## 2023-11-13 DIAGNOSIS — N186 End stage renal disease: Secondary | ICD-10-CM | POA: Diagnosis not present

## 2023-11-13 DIAGNOSIS — N2581 Secondary hyperparathyroidism of renal origin: Secondary | ICD-10-CM | POA: Diagnosis not present

## 2023-11-16 DIAGNOSIS — D688 Other specified coagulation defects: Secondary | ICD-10-CM | POA: Diagnosis not present

## 2023-11-16 DIAGNOSIS — D689 Coagulation defect, unspecified: Secondary | ICD-10-CM | POA: Diagnosis not present

## 2023-11-16 DIAGNOSIS — D631 Anemia in chronic kidney disease: Secondary | ICD-10-CM | POA: Diagnosis not present

## 2023-11-16 DIAGNOSIS — T8249XA Other complication of vascular dialysis catheter, initial encounter: Secondary | ICD-10-CM | POA: Diagnosis not present

## 2023-11-16 DIAGNOSIS — N186 End stage renal disease: Secondary | ICD-10-CM | POA: Diagnosis not present

## 2023-11-16 DIAGNOSIS — R52 Pain, unspecified: Secondary | ICD-10-CM | POA: Diagnosis not present

## 2023-11-16 DIAGNOSIS — Z992 Dependence on renal dialysis: Secondary | ICD-10-CM | POA: Diagnosis not present

## 2023-11-16 DIAGNOSIS — N2581 Secondary hyperparathyroidism of renal origin: Secondary | ICD-10-CM | POA: Diagnosis not present

## 2023-11-18 DIAGNOSIS — R52 Pain, unspecified: Secondary | ICD-10-CM | POA: Diagnosis not present

## 2023-11-18 DIAGNOSIS — D688 Other specified coagulation defects: Secondary | ICD-10-CM | POA: Diagnosis not present

## 2023-11-18 DIAGNOSIS — D631 Anemia in chronic kidney disease: Secondary | ICD-10-CM | POA: Diagnosis not present

## 2023-11-18 DIAGNOSIS — D689 Coagulation defect, unspecified: Secondary | ICD-10-CM | POA: Diagnosis not present

## 2023-11-18 DIAGNOSIS — Z992 Dependence on renal dialysis: Secondary | ICD-10-CM | POA: Diagnosis not present

## 2023-11-18 DIAGNOSIS — N186 End stage renal disease: Secondary | ICD-10-CM | POA: Diagnosis not present

## 2023-11-18 DIAGNOSIS — T8249XA Other complication of vascular dialysis catheter, initial encounter: Secondary | ICD-10-CM | POA: Diagnosis not present

## 2023-11-18 DIAGNOSIS — N2581 Secondary hyperparathyroidism of renal origin: Secondary | ICD-10-CM | POA: Diagnosis not present

## 2023-11-19 DIAGNOSIS — T8249XA Other complication of vascular dialysis catheter, initial encounter: Secondary | ICD-10-CM | POA: Diagnosis not present

## 2023-11-19 DIAGNOSIS — D688 Other specified coagulation defects: Secondary | ICD-10-CM | POA: Diagnosis not present

## 2023-11-19 DIAGNOSIS — D631 Anemia in chronic kidney disease: Secondary | ICD-10-CM | POA: Diagnosis not present

## 2023-11-19 DIAGNOSIS — R52 Pain, unspecified: Secondary | ICD-10-CM | POA: Diagnosis not present

## 2023-11-19 DIAGNOSIS — N186 End stage renal disease: Secondary | ICD-10-CM | POA: Diagnosis not present

## 2023-11-19 DIAGNOSIS — Z992 Dependence on renal dialysis: Secondary | ICD-10-CM | POA: Diagnosis not present

## 2023-11-19 DIAGNOSIS — D689 Coagulation defect, unspecified: Secondary | ICD-10-CM | POA: Diagnosis not present

## 2023-11-19 DIAGNOSIS — N2581 Secondary hyperparathyroidism of renal origin: Secondary | ICD-10-CM | POA: Diagnosis not present

## 2023-11-20 DIAGNOSIS — T8249XA Other complication of vascular dialysis catheter, initial encounter: Secondary | ICD-10-CM | POA: Diagnosis not present

## 2023-11-20 DIAGNOSIS — D689 Coagulation defect, unspecified: Secondary | ICD-10-CM | POA: Diagnosis not present

## 2023-11-20 DIAGNOSIS — N2581 Secondary hyperparathyroidism of renal origin: Secondary | ICD-10-CM | POA: Diagnosis not present

## 2023-11-20 DIAGNOSIS — R52 Pain, unspecified: Secondary | ICD-10-CM | POA: Diagnosis not present

## 2023-11-20 DIAGNOSIS — D688 Other specified coagulation defects: Secondary | ICD-10-CM | POA: Diagnosis not present

## 2023-11-20 DIAGNOSIS — N186 End stage renal disease: Secondary | ICD-10-CM | POA: Diagnosis not present

## 2023-11-20 DIAGNOSIS — D631 Anemia in chronic kidney disease: Secondary | ICD-10-CM | POA: Diagnosis not present

## 2023-11-20 DIAGNOSIS — Z992 Dependence on renal dialysis: Secondary | ICD-10-CM | POA: Diagnosis not present

## 2023-11-22 ENCOUNTER — Ambulatory Visit

## 2023-11-22 VITALS — Ht 66.0 in | Wt 139.0 lb

## 2023-11-22 DIAGNOSIS — Z0000 Encounter for general adult medical examination without abnormal findings: Secondary | ICD-10-CM | POA: Diagnosis not present

## 2023-11-22 NOTE — Patient Instructions (Signed)
 [name] [name] , Thank you for taking time out of your busy schedule to complete your Annual Wellness Visit with me. I enjoyed our conversation and look forward to speaking with you again next year. I, as well as your care team,  appreciate your ongoing commitment to your health goals. Please review the following plan we discussed and let me know if I can assist you in the future. Your Game plan/ To Do List    Referrals: If you haven't heard from the office you've been referred to, please reach out to them at the phone provided.   Follow up Visits: Next Medicare AWV with our clinical staff: 11/23/2024 at 9:10 a.m. PHONE VISIT with Nurse   Have you seen your provider in the last 6 months (3 months if uncontrolled diabetes)? Yes Next Office Visit with your provider: due in July 2025  Clinician Recommendations:  Aim for 30 minutes of exercise or brisk walking, 6-8 glasses of water, and 5 servings of fruits and vegetables each day.       This is a list of the screening recommended for you and due dates:  Health Maintenance  Topic Date Due   Zoster (Shingles) Vaccine (1 of 2) 05/18/2003   DTaP/Tdap/Td vaccine (3 - Tdap) 10/10/2019   COVID-19 Vaccine (5 - 2024-25 season) 03/07/2023   Flu Shot  02/04/2024   Mammogram  03/02/2024   Pneumonia Vaccine (4 of 4 - PCV20 or PCV21) 06/13/2024   Medicare Annual Wellness Visit  11/21/2024   Colon Cancer Screening  02/25/2028   DEXA scan (bone density measurement)  Completed   Hepatitis C Screening  Completed   HPV Vaccine  Aged Out   Meningitis B Vaccine  Aged Out    Advanced directives: (Declined) Advance directive discussed with you today. Even though you declined this today, please call our office should you change your mind, and we can give you the proper paperwork for you to fill out. Advance Care Planning is important because it:  [x]  Makes sure you receive the medical care that is consistent with your values, goals, and preferences  [x]  It  provides guidance to your family and loved ones and reduces their decisional burden about whether or not they are making the right decisions based on your wishes.  Follow the link provided in your after visit summary or read over the paperwork we have mailed to you to help you started getting your Advance Directives in place. If you need assistance in completing these, please reach out to us  so that we can help you!  See attachments for Preventive Care and Fall Prevention Tips.

## 2023-11-22 NOTE — Progress Notes (Addendum)
 Because this visit was a virtual/telehealth visit,  certain criteria was not obtained, such a blood pressure, CBG if applicable, and timed get up and go. Any medications not marked as "taking" were not mentioned during the medication reconciliation part of the visit. Any vitals not documented were not able to be obtained due to this being a telehealth visit or patient was unable to self-report a recent blood pressure reading due to a lack of equipment at home via telehealth. Vitals that have been documented are verbally provided by the patient.   Subjective:   [name] [name] is a 73 y.o. who presents for a Medicare Wellness preventive visit.  As a reminder, Annual Wellness Visits don't include a physical exam, and some assessments may be limited, especially if this visit is performed virtually. We may recommend an in-person follow-up visit with your provider if needed.  Visit Complete: Virtual I connected with  [name] [name] on 11/22/23 by a audio enabled telemedicine application and verified that I am speaking with the correct person using two identifiers.  Patient Location: Home  Provider Location: Office/Clinic  I discussed the limitations of evaluation and management by telemedicine. The patient expressed understanding and agreed to proceed.  Vital Signs: Because this visit was a virtual/telehealth visit, some criteria may be missing or patient reported. Any vitals not documented were not able to be obtained and vitals that have been documented are patient reported.  VideoDeclined- This patient declined Librarian, academic. Therefore the visit was completed with audio only.  Persons Participating in Visit: Patient.  AWV Questionnaire: No: Patient Medicare AWV questionnaire was not completed prior to this visit.  Cardiac Risk Factors include: advanced age (>10men, >7 women);family history of premature cardiovascular disease;hypertension;dyslipidemia;sedentary  lifestyle     Objective:     Today's Vitals   11/22/23 0927  Weight: 139 lb (63 kg)  Height: 5\' 6"  (1.676 m)  PainSc: 0-No pain   Body mass index is 22.44 kg/m.     11/22/2023    9:29 AM 10/14/2023   10:37 AM 09/24/2023    7:30 AM 08/23/2023    7:41 AM 07/28/2023    7:42 AM 07/27/2023    1:17 PM 07/19/2023    9:56 AM  Advanced Directives  Does Patient Have a Medical Advance Directive? No No No No No No No  Would patient like information on creating a medical advance directive? No - Patient declined No - Patient declined No - Patient declined Yes (MAU/Ambulatory/Procedural Areas - Information given) No - Patient declined      Current Medications (verified) Outpatient Encounter Medications as of 11/22/2023  Medication Sig   acetaminophen  (TYLENOL ) 500 MG tablet Take 1,000 mg by mouth every 6 (six) hours as needed for mild pain (pain score 1-3) or moderate pain (pain score 4-6).   Ascorbic Acid  500 MG CAPS Take 500 mg by mouth daily. (Patient not taking: Reported on 10/22/2023)   atorvastatin  (LIPITOR) 40 MG tablet Take 1 tablet (40 mg total) by mouth daily.   citalopram  (CELEXA ) 10 MG tablet Take 1 tablet (10 mg total) by mouth daily.   diclofenac  Sodium (VOLTAREN ) 1 % GEL APPLY TWO GRAMS TOPICAALY FOUR TIMES DAILY (Patient taking differently: Apply 2 g topically daily as needed (sciatica).)   Glycerin (CLEAR EYES ADV DRY & ITCHY RLF) 0.25 % SOLN Place 1 drop into both eyes daily as needed (Dry eye).   lidocaine -prilocaine  (EMLA ) cream Apply 1 Application topically Every Tuesday,Thursday,and Saturday with dialysis.   LORazepam  (  ATIVAN ) 0.5 MG tablet Take 1 tablet (0.5 mg total) by mouth 2 (two) times daily as needed. for anxiety (Patient taking differently: Take 0.5 mg by mouth 2 (two) times daily as needed for anxiety.)   Mesalamine  800 MG TBEC Take 1 tablet (800 mg total) by mouth 3 (three) times daily.   midodrine  (PROAMATINE ) 10 MG tablet Take 10 mg by mouth Every  Tuesday,Thursday,and Saturday with dialysis.   mirtazapine  (REMERON ) 15 MG tablet TAKE ONE TABLET BY MOUTH AT BEDTIME   pregabalin  (LYRICA ) 25 MG capsule Take 1 capsule (25 mg total) by mouth 2 (two) times daily.   sevelamer  carbonate (RENVELA ) 800 MG tablet Take 1,600 mg by mouth 3 (three) times daily with meals.   No facility-administered encounter medications on file as of 11/22/2023.    Allergies (verified) Aspirin, Beta-lactamase inhibitors (beta-lactam), Morphine  and codeine, and Ace inhibitors   History: Past Medical History:  Diagnosis Date   Abnormal chest CT    Coronary atherosclerosis on chest CT 2012   Anemia, chronic renal failure    Anxiety    Arthritis    Asthma 05/2011   CAD (coronary artery disease)    Carpal tunnel syndrome    CHF (congestive heart failure) (HCC)    EF 30-35% 2012->EF 60-65% 2013   CKD (chronic kidney disease), stage III (HCC)    Crohn's disease (HCC)    GERD (gastroesophageal reflux disease)    Headache(784.0)    "related to high BP" (05/30/2012)   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   Hyperlipidemia    Hypertension    Hypertensive retinopathy    OU   Osteopenia    Panic attacks    Pneumonia    Reflux esophagitis    Seizure (HCC)    hx of   Tobacco abuse    Vitamin D  deficiency    Past Surgical History:  Procedure Laterality Date   A/V FISTULAGRAM Left 08/23/2023   Procedure: A/V Fistulagram;  Surgeon: Philipp Brawn, MD;  Location: [facility] INVASIVE CV LAB;  Service: Cardiovascular;  Laterality: Left;   AV FISTULA PLACEMENT Right 04/28/2021   Procedure: RIGHT ARTERIOVENOUS (AV) GRAFT INSERTION USING 4-7MM x 45CM GORE-TEX GRAFT;  Surgeon: Dannis Dy, MD;  Location: [facility] OR;  Service: Vascular;  Laterality: Right;   AV FISTULA PLACEMENT Left 07/28/2023   Procedure: LEFT ARM ARTERIOVENOUS (AV) GRAFT USING CORE-TEX STRETCH VASCULAR;  Surgeon: Philipp Brawn, MD;  Location: [facility] OR;  Service: Vascular;  Laterality: Left;   CATARACT  EXTRACTION Bilateral    Dr. Maris Sickle   CATARACT EXTRACTION W/ INTRAOCULAR LENS  IMPLANT, BILATERAL  ~ 2000   CHOLECYSTECTOMY  01/28/2005   COLOSTOMY  03/1996   diverting   ECTOPIC PREGNANCY SURGERY  ?1980's   left   EYE SURGERY Bilateral    Cat Sx - Dr. Maris Sickle   ILEOSTOMY  ?  2002   IR FLUORO GUIDE CV LINE RIGHT  04/25/2021   IR US  GUIDE VASC ACCESS RIGHT  04/25/2021   NECK SURGERY  2020   "pinched nerve"   PERIPHERAL VASCULAR BALLOON ANGIOPLASTY Left 08/23/2023   Procedure: PERIPHERAL VASCULAR BALLOON ANGIOPLASTY;  Surgeon: Philipp Brawn, MD;  Location: [facility] INVASIVE CV LAB;  Service: Cardiovascular;  Laterality: Left;   PERIPHERAL VASCULAR INTERVENTION Left 08/23/2023   Procedure: PERIPHERAL VASCULAR INTERVENTION;  Surgeon: Philipp Brawn, MD;  Location: [facility] INVASIVE CV LAB;  Service: Cardiovascular;  Laterality: Left;   PERIPHERAL VASCULAR THROMBECTOMY Left 08/23/2023   Procedure: PERIPHERAL VASCULAR  THROMBECTOMY;  Surgeon: Philipp Brawn, MD;  Location: [facility] INVASIVE CV LAB;  Service: Cardiovascular;  Laterality: Left;   REMOVAL OF GRAFT Left 09/24/2023   Procedure: REMOVAL, GRAFT, BRACHIAL ARTERY PATCH;  Surgeon: Adine Hoof, MD;  Location: [facility] OR;  Service: Vascular;  Laterality: Left;  Left arm arteriovenous graft excision   Family History  Problem Relation Age of Onset   Heart disease Mother    Other Mother        Covid   Glaucoma Mother    Stroke Father    Other Sister        AIDS   Social History   Socioeconomic History   Marital status: Single    Spouse name: Not on file   Number of children: 1   Years of education: associate   Highest education level: Not on file  Occupational History   Occupation: Retired-cook    Employer: [employer]  Tobacco Use   Smoking status: Some Days    Current packs/day: 0.12    Average packs/day: 0.1 packs/day for 30.0 years (3.6 ttl pk-yrs)    Types: Cigarettes   Smokeless tobacco: Never   Tobacco  comments:    1 pack lasts almost 2 weeks per pt  Vaping Use   Vaping status: Never Used  Substance and Sexual Activity   Alcohol use: Not Currently    Comment: 05/30/2012 "used to drink back in the day; last alcohol 23 yr ago"   Drug use: No   Sexual activity: Not Currently  Other Topics Concern   Not on file  Social History Narrative   Health Care POA:    Emergency Contact: partner, Moishe Angel, [phone]   End of Life Plan:    Who lives with you: no one 09/13/19, no pets   Any pets: cat- Wild Thing   Diet: pt has a varied diet of protein, starch, and vegetables    Exercise: Pt does not have any regular exercise routine   Seatbelts:Pt reports wearing seatbelt when in vehicles.    Paulene Boron Exposure/Protection:    Hobbies: cooking, bingo, basketball         Social Drivers of Corporate investment banker Strain: Low Risk  (11/22/2023)   Overall Financial Resource Strain (CARDIA)    Difficulty of Paying Living Expenses: Not hard at all  Food Insecurity: No Food Insecurity (11/22/2023)   Hunger Vital Sign    Worried About Running Out of Food in the Last Year: Never true    Ran Out of Food in the Last Year: Never true  Transportation Needs: No Transportation Needs (11/22/2023)   PRAPARE - Administrator, Civil Service (Medical): No    Lack of Transportation (Non-Medical): No  Physical Activity: Inactive (11/22/2023)   Exercise Vital Sign    Days of Exercise per Week: 0 days    Minutes of Exercise per Session: 0 min  Stress: No Stress Concern Present (11/22/2023)   [employer] of Occupational Health - Occupational Stress Questionnaire    Feeling of Stress : Not at all  Social Connections: Moderately Isolated (11/22/2023)   Social Connection and Isolation Panel [NHANES]    Frequency of Communication with Friends and Family: Once a week    Frequency of Social Gatherings with Friends and Family: Once a week    Attends Religious Services: 1 to 4 times per year    Active  Member of [employer] or Organizations: No    Attends Banker Meetings: 1 to  4 times per year    Marital Status: Never married    Tobacco Counseling Ready to quit: Not Answered Counseling given: Not Answered Tobacco comments: 1 pack lasts almost 2 weeks per pt    Clinical Intake:  Pre-visit preparation completed: Yes  Pain : No/denies pain Pain Score: 0-No pain     BMI - recorded: 22.44 Nutritional Status: BMI of 19-24  Normal Nutritional Risks: None Diabetes: No  Lab Results  Component Value Date   HGBA1C (H) 11/07/2010    5.7 (NOTE)                                                                       According to the ADA Clinical Practice Recommendations for 2011, when HbA1c is used as a screening test:   >=6.5%   Diagnostic of Diabetes Mellitus           (if abnormal result  is confirmed)  5.7-6.4%   Increased risk of developing Diabetes Mellitus  References:Diagnosis and Classification of Diabetes Mellitus,Diabetes Care,2011,34(Suppl 1):S62-S69 and Standards of Medical Care in         Diabetes - 2011,Diabetes Care,2011,34  (Suppl 1):S11-S61.     How often do you need to have someone help you when you read instructions, pamphlets, or other written materials from your doctor or pharmacy?: 1 - Never  Interpreter Needed?: No  Information entered by :: Patrice Moates N. Isaiah Torok, LPN.   Activities of Daily Living     11/22/2023    9:35 AM 09/24/2023    7:37 AM  In your present state of health, do you have any difficulty performing the following activities:  Hearing? 0   Vision? 0   Difficulty concentrating or making decisions? 0   Walking or climbing stairs? 1   Comment uses a cane prn   Dressing or bathing? 0   Doing errands, shopping? 0 0  Preparing Food and eating ? N   Using the Toilet? N   In the past six months, have you accidently leaked urine? N   Do you have problems with loss of bowel control? N   Managing your Medications? N   Managing your  Finances? N   Housekeeping or managing your Housekeeping? N     Patient Care Team: Kandis Ormond, DO as PCP - General (Family Medicine) O'Neal, Cathay Clonts, MD as PCP - Cardiology (Cardiology) Maris Sickle, MD (Ophthalmology) Toniann Franklin Lemon Qua, MD (General Surgery) Delilah Fend, MD (Inactive) (Gastroenterology) Nahser, Lela Purple, MD (Cardiology) Lyanne Sample, MD as Referring Physician (Orthopedic Surgery) [facility]  Indicate any recent Medical Services you may have received from other than [facility] providers in the past year (date may be approximate).     Assessment:    This is a routine wellness examination for [place].  Hearing/Vision screen Hearing Screening - Comments:: Denies hearing difficulties.  Vision Screening - Comments:: Wears rx glasses - up to date with routine eye exams with [facility]    Goals Addressed             This Visit's Progress    To stay independent and enjoy my grandkids.         Depression Screen     11/22/2023  9:33 AM 07/19/2023    9:55 AM 06/07/2023   12:00 PM 01/15/2023    4:24 PM 08/19/2022    8:48 AM 10/27/2021    9:41 AM 09/22/2021   10:22 AM  PHQ 2/9 Scores  PHQ - 2 Score 0 0 1 2 2  0 0  PHQ- 9 Score 0 2 5 7 8 2 2     Fall Risk     11/22/2023    9:35 AM 07/19/2023    9:56 AM 01/15/2023    4:23 PM 08/19/2022    8:44 AM 10/27/2021    9:41 AM  Fall Risk   Falls in the past year? 0 0 0 1 0  Number falls in past yr: 0  0 0 0  Injury with Fall? 0  0  0  Risk for fall due to : No Fall Risks  No Fall Risks    Follow up Falls prevention discussed;Falls evaluation completed  Falls prevention discussed;Education provided;Falls evaluation completed      MEDICARE RISK AT HOME:  Medicare Risk at Home Any stairs in or around the home?: No If so, are there any without handrails?: No Home free of loose throw rugs in walkways, pet beds, electrical cords, etc?: Yes Adequate lighting in your home to reduce risk of  falls?: Yes Life alert?: No Use of a cane, walker or w/c?: No Grab bars in the bathroom?: Yes Shower chair or bench in shower?: No Elevated toilet seat or a handicapped toilet?: No  TIMED UP AND GO:  Was the test performed?  No  Cognitive Function: 6CIT completed    11/22/2023    9:29 AM 09/23/2011    4:00 PM  MMSE - Mini Mental State Exam  Not completed: Unable to complete   Orientation to time  5  Orientation to Place  5  Registration  3  Attention/ Calculation  5  Recall  3  Language- name 2 objects  2  Language- repeat  1  Language- follow 3 step command  3  Language- read & follow direction  1  Write a sentence  1  Copy design  1  Total score  30        11/22/2023    9:28 AM 01/15/2023    4:24 PM  6CIT Screen  What Year? 0 points 0 points  What month? 0 points 0 points  What time? 0 points 0 points  Count back from 20 0 points 0 points  Months in reverse 0 points 0 points  Repeat phrase 0 points 0 points  Total Score 0 points 0 points    Immunizations Immunization History  Administered Date(s) Administered   Fluad Quad(high Dose 65+) 05/26/2019   Hepb-cpg 06/19/2021, 07/17/2021   Influenza Split 04/29/2012   Influenza Whole 06/17/2007, 05/09/2008, 05/07/2009, 05/09/2010   Influenza, High Dose Seasonal PF 05/26/2019   Influenza, Quadrivalent, Recombinant, Inj, Pf 04/23/2022   Influenza,inj,Quad PF,6+ Mos 09/19/2014, 03/19/2016, 03/25/2020   Influenza,trivalent, recombinat, inj, PF 04/20/2023   Influenza-Unspecified 05/09/2011, 04/29/2012, 05/06/2013, 05/17/2013, 09/19/2014, 03/19/2016, 05/08/2021   PFIZER(Purple Top)SARS-COV-2 Vaccination 09/16/2019, 10/07/2019, 04/15/2020   Pfizer Covid-19 Vaccine Bivalent Booster 59yrs & up 05/19/2021   Pneumococcal Conjugate-13 06/14/2019   Pneumococcal Polysaccharide-23 04/06/1999, 05/17/2013   Td 03/07/1999, 10/09/2009   Zoster, Live 01/19/2014    Screening Tests Health Maintenance  Topic Date Due   Zoster  Vaccines- Shingrix (1 of 2) 05/18/2003   DTaP/Tdap/Td (3 - Tdap) 10/10/2019   COVID-19 Vaccine (5 - 2024-25 season) 03/07/2023  INFLUENZA VACCINE  02/04/2024   MAMMOGRAM  03/02/2024   Pneumonia Vaccine 35+ Years old (4 of 4 - PCV20 or PCV21) 06/13/2024   Medicare Annual Wellness (AWV)  11/21/2024   Colonoscopy  02/25/2028   DEXA SCAN  Completed   Hepatitis C Screening  Completed   HPV VACCINES  Aged Out   Meningococcal B Vaccine  Aged Out    Health Maintenance  Health Maintenance Due  Topic Date Due   Zoster Vaccines- Shingrix (1 of 2) 05/18/2003   DTaP/Tdap/Td (3 - Tdap) 10/10/2019   COVID-19 Vaccine (5 - 2024-25 season) 03/07/2023   Health Maintenance Items Addressed: Yes Patient aware of current care gaps.  Additional Screening:  Vision Screening: Recommended annual ophthalmology exams for early detection of glaucoma and other disorders of the eye.  Dental Screening: Recommended annual dental exams for proper oral hygiene  Community Resource Referral / Chronic Care Management: CRR required this visit?  No   CCM required this visit?  No   Plan:    I have personally reviewed and noted the following in the patient's chart:   Medical and social history Use of alcohol, tobacco or illicit drugs  Current medications and supplements including opioid prescriptions. Patient is not currently taking opioid prescriptions. Functional ability and status Nutritional status Physical activity Advanced directives List of other physicians Hospitalizations, surgeries, and ER visits in previous 12 months Vitals Screenings to include cognitive, depression, and falls Referrals and appointments  In addition, I have reviewed and discussed with patient certain preventive protocols, quality metrics, and best practice recommendations. A written personalized care plan for preventive services as well as general preventive health recommendations were provided to patient.   Margette Sheldon, LPN   03/03/5620   After Visit Summary: (Declined) Due to this being a telephonic visit, with patients personalized plan was offered to patient but patient Declined AVS at this time   Nurse Notes:  Patient aware of current care gaps. Patient scheduled for next year AWV.

## 2023-11-23 DIAGNOSIS — N2581 Secondary hyperparathyroidism of renal origin: Secondary | ICD-10-CM | POA: Diagnosis not present

## 2023-11-23 DIAGNOSIS — T8249XA Other complication of vascular dialysis catheter, initial encounter: Secondary | ICD-10-CM | POA: Diagnosis not present

## 2023-11-23 DIAGNOSIS — Z992 Dependence on renal dialysis: Secondary | ICD-10-CM | POA: Diagnosis not present

## 2023-11-23 DIAGNOSIS — D688 Other specified coagulation defects: Secondary | ICD-10-CM | POA: Diagnosis not present

## 2023-11-23 DIAGNOSIS — R52 Pain, unspecified: Secondary | ICD-10-CM | POA: Diagnosis not present

## 2023-11-23 DIAGNOSIS — D631 Anemia in chronic kidney disease: Secondary | ICD-10-CM | POA: Diagnosis not present

## 2023-11-23 DIAGNOSIS — D689 Coagulation defect, unspecified: Secondary | ICD-10-CM | POA: Diagnosis not present

## 2023-11-23 DIAGNOSIS — N186 End stage renal disease: Secondary | ICD-10-CM | POA: Diagnosis not present

## 2023-11-25 DIAGNOSIS — R52 Pain, unspecified: Secondary | ICD-10-CM | POA: Diagnosis not present

## 2023-11-25 DIAGNOSIS — N186 End stage renal disease: Secondary | ICD-10-CM | POA: Diagnosis not present

## 2023-11-25 DIAGNOSIS — N2581 Secondary hyperparathyroidism of renal origin: Secondary | ICD-10-CM | POA: Diagnosis not present

## 2023-11-25 DIAGNOSIS — T8249XA Other complication of vascular dialysis catheter, initial encounter: Secondary | ICD-10-CM | POA: Diagnosis not present

## 2023-11-25 DIAGNOSIS — D689 Coagulation defect, unspecified: Secondary | ICD-10-CM | POA: Diagnosis not present

## 2023-11-25 DIAGNOSIS — Z992 Dependence on renal dialysis: Secondary | ICD-10-CM | POA: Diagnosis not present

## 2023-11-25 DIAGNOSIS — D631 Anemia in chronic kidney disease: Secondary | ICD-10-CM | POA: Diagnosis not present

## 2023-11-25 DIAGNOSIS — D688 Other specified coagulation defects: Secondary | ICD-10-CM | POA: Diagnosis not present

## 2023-11-27 DIAGNOSIS — R52 Pain, unspecified: Secondary | ICD-10-CM | POA: Diagnosis not present

## 2023-11-27 DIAGNOSIS — N2581 Secondary hyperparathyroidism of renal origin: Secondary | ICD-10-CM | POA: Diagnosis not present

## 2023-11-27 DIAGNOSIS — D689 Coagulation defect, unspecified: Secondary | ICD-10-CM | POA: Diagnosis not present

## 2023-11-27 DIAGNOSIS — Z992 Dependence on renal dialysis: Secondary | ICD-10-CM | POA: Diagnosis not present

## 2023-11-27 DIAGNOSIS — D688 Other specified coagulation defects: Secondary | ICD-10-CM | POA: Diagnosis not present

## 2023-11-27 DIAGNOSIS — T8249XA Other complication of vascular dialysis catheter, initial encounter: Secondary | ICD-10-CM | POA: Diagnosis not present

## 2023-11-27 DIAGNOSIS — N186 End stage renal disease: Secondary | ICD-10-CM | POA: Diagnosis not present

## 2023-11-27 DIAGNOSIS — D631 Anemia in chronic kidney disease: Secondary | ICD-10-CM | POA: Diagnosis not present

## 2023-11-30 DIAGNOSIS — D689 Coagulation defect, unspecified: Secondary | ICD-10-CM | POA: Diagnosis not present

## 2023-11-30 DIAGNOSIS — N186 End stage renal disease: Secondary | ICD-10-CM | POA: Diagnosis not present

## 2023-11-30 DIAGNOSIS — D631 Anemia in chronic kidney disease: Secondary | ICD-10-CM | POA: Diagnosis not present

## 2023-11-30 DIAGNOSIS — R52 Pain, unspecified: Secondary | ICD-10-CM | POA: Diagnosis not present

## 2023-11-30 DIAGNOSIS — N2581 Secondary hyperparathyroidism of renal origin: Secondary | ICD-10-CM | POA: Diagnosis not present

## 2023-11-30 DIAGNOSIS — D688 Other specified coagulation defects: Secondary | ICD-10-CM | POA: Diagnosis not present

## 2023-11-30 DIAGNOSIS — T8249XA Other complication of vascular dialysis catheter, initial encounter: Secondary | ICD-10-CM | POA: Diagnosis not present

## 2023-11-30 DIAGNOSIS — Z992 Dependence on renal dialysis: Secondary | ICD-10-CM | POA: Diagnosis not present

## 2023-12-02 DIAGNOSIS — D631 Anemia in chronic kidney disease: Secondary | ICD-10-CM | POA: Diagnosis not present

## 2023-12-02 DIAGNOSIS — Z992 Dependence on renal dialysis: Secondary | ICD-10-CM | POA: Diagnosis not present

## 2023-12-02 DIAGNOSIS — T8249XA Other complication of vascular dialysis catheter, initial encounter: Secondary | ICD-10-CM | POA: Diagnosis not present

## 2023-12-02 DIAGNOSIS — D689 Coagulation defect, unspecified: Secondary | ICD-10-CM | POA: Diagnosis not present

## 2023-12-02 DIAGNOSIS — R52 Pain, unspecified: Secondary | ICD-10-CM | POA: Diagnosis not present

## 2023-12-02 DIAGNOSIS — D688 Other specified coagulation defects: Secondary | ICD-10-CM | POA: Diagnosis not present

## 2023-12-02 DIAGNOSIS — N2581 Secondary hyperparathyroidism of renal origin: Secondary | ICD-10-CM | POA: Diagnosis not present

## 2023-12-02 DIAGNOSIS — N186 End stage renal disease: Secondary | ICD-10-CM | POA: Diagnosis not present

## 2023-12-03 ENCOUNTER — Ambulatory Visit (HOSPITAL_COMMUNITY)
Admission: RE | Admit: 2023-12-03 | Discharge: 2023-12-03 | Disposition: A | Discharge status: Home / Self Care | Attending: Vascular Surgery | Admitting: Vascular Surgery

## 2023-12-03 ENCOUNTER — Encounter (HOSPITAL_COMMUNITY)
Admission: RE | Disposition: A | Discharge status: Home / Self Care | Payer: Self-pay | Source: Home / Self Care | Attending: Vascular Surgery

## 2023-12-03 ENCOUNTER — Other Ambulatory Visit: Payer: Self-pay

## 2023-12-03 DIAGNOSIS — Z79899 Other long term (current) drug therapy: Secondary | ICD-10-CM | POA: Insufficient documentation

## 2023-12-03 DIAGNOSIS — F17210 Nicotine dependence, cigarettes, uncomplicated: Secondary | ICD-10-CM | POA: Insufficient documentation

## 2023-12-03 DIAGNOSIS — E785 Hyperlipidemia, unspecified: Secondary | ICD-10-CM | POA: Insufficient documentation

## 2023-12-03 DIAGNOSIS — Y839 Surgical procedure, unspecified as the cause of abnormal reaction of the patient, or of later complication, without mention of misadventure at the time of the procedure: Secondary | ICD-10-CM | POA: Diagnosis not present

## 2023-12-03 DIAGNOSIS — Z992 Dependence on renal dialysis: Secondary | ICD-10-CM | POA: Diagnosis not present

## 2023-12-03 DIAGNOSIS — I509 Heart failure, unspecified: Secondary | ICD-10-CM | POA: Diagnosis not present

## 2023-12-03 DIAGNOSIS — N186 End stage renal disease: Secondary | ICD-10-CM

## 2023-12-03 DIAGNOSIS — T82898A Other specified complication of vascular prosthetic devices, implants and grafts, initial encounter: Secondary | ICD-10-CM

## 2023-12-03 DIAGNOSIS — T8249XA Other complication of vascular dialysis catheter, initial encounter: Secondary | ICD-10-CM | POA: Diagnosis not present

## 2023-12-03 DIAGNOSIS — I132 Hypertensive heart and chronic kidney disease with heart failure and with stage 5 chronic kidney disease, or end stage renal disease: Secondary | ICD-10-CM | POA: Diagnosis not present

## 2023-12-03 HISTORY — PX: TUNNELLED CATHETER EXCHANGE: CATH118373

## 2023-12-03 SURGERY — TUNNELLED CATHETER EXCHANGE
Anesthesia: LOCAL

## 2023-12-03 MED ORDER — HEPARIN SODIUM (PORCINE) 1000 UNIT/ML IJ SOLN
INTRAMUSCULAR | Status: AC
  Filled 2023-12-03: qty 10

## 2023-12-03 MED ORDER — HEPARIN (PORCINE) IN NACL 1000-0.9 UT/500ML-% IV SOLN
INTRAVENOUS | Status: DC | PRN
  Administered 2023-12-03: 500 mL

## 2023-12-03 MED ORDER — LIDOCAINE HCL (PF) 1 % IJ SOLN
INTRAMUSCULAR | Status: AC
  Filled 2023-12-03: qty 30

## 2023-12-03 MED ORDER — MIDAZOLAM HCL 2 MG/2ML IJ SOLN
INTRAMUSCULAR | Status: AC
Start: 2023-12-03 — End: ?
  Filled 2023-12-03: qty 2

## 2023-12-03 MED ORDER — HEPARIN SODIUM (PORCINE) 1000 UNIT/ML IJ SOLN
INTRAMUSCULAR | Status: DC | PRN
  Administered 2023-12-03 (×2): 1900 [IU] via INTRAVENOUS

## 2023-12-03 MED ORDER — LIDOCAINE HCL (PF) 1 % IJ SOLN
INTRAMUSCULAR | Status: DC | PRN
  Administered 2023-12-03: 20 mL

## 2023-12-03 MED ORDER — MIDAZOLAM HCL 2 MG/2ML IJ SOLN
INTRAMUSCULAR | Status: DC | PRN
  Administered 2023-12-03: 1 mg via INTRAVENOUS

## 2023-12-03 SURGICAL SUPPLY — 4 items
CATH PALINDROME-P 23 W/VT (CATHETERS) IMPLANT
GUIDEWIRE LT ZIPWIRE 035X260 (WIRE) IMPLANT
TRAY PV CATH (CUSTOM PROCEDURE TRAY) ×2 IMPLANT
WIRE BENTSON .035X145CM (WIRE) IMPLANT

## 2023-12-03 NOTE — Op Note (Signed)
    Patient name: [name] [name] MRN: [id number] DOB: [birth year] Sex: female  12/03/2023 Pre-operative Diagnosis: ESRD Post-operative diagnosis:  Same Surgeon:  Gareld June Procedure Performed:  1.  Placement of 23 cm right internal jugular vein tunneled dialysis catheter using fluoroscopic guidance  2.  Removal of right internal jugular vein tunneled dialysis catheter  Indications: This is a 73 year old female on dialysis via right sided catheter which is not functioning.  She comes in today for catheter exchange  Procedure:  The patient was identified in the holding area and taken to room 8.  The patient was then placed supine on the table and prepped and draped in the usual sterile fashion.  A time out was called.  1% lidocaine  was used for local anesthesia.  I made a skin nick over top of the catheter in the right neck with an 11 blade.  Blunt dissection was used to expose the catheter which was grasped with a hemostat.  I then freed up the cuff at the skin exit site and transected the catheter.  The cuff then was removed and the other end was brought out through the incision in the neck.  A Bentson wire was inserted under fluoroscopic guidance.  The remaining catheter was removed and the peel-away sheath was placed.  A new skin exit site was selected.  An 11 blade was used to make a skin nick.  A tunnel was created between the 2 incisions.  The catheter was fed through the peel-away sheath which was then removed.  Fluoroscopy confirmed that the catheter tip was at the cavoatrial junction and there were no kinks within the catheter.  Both ports flushed and aspirated without difficulty.  The catheter was filled with the appropriate volume to heparin .  It was sutured to the skin with 3-0 nylon.  The neck incision was closed with 4 Monocryl and Dermabond.  There were no complications   Impression:  #1  Successful replacement of right internal jugular vein 23 cm tunneled dialysis catheter  #2   Catheter is ready for use     V. Gareld June, [name], [facility] [place] Office: [phone] Pager:  [phone]

## 2023-12-03 NOTE — H&P (Signed)
 [facility] [place]  Patient name: [name] [name] MRN: [id number] DOB: [birth year] Sex: female   REASON FOR VISIT:    Dialysis access  HISOTRY OF PRESENT ILLNESS:    [name] [name] is a 73 y.o. female who dialyzes through a right sided catheter.  She is having difficulty with her access.  They are having to use tPA frequently.  She is here today for catheter exchange.  She recently underwent excision of the left upper extremity graft on 09/24/2023 by Dr. Vikki Graves.  This has healing nicely.  She does not want any other access, and likely does not have options in the arms.  Patient is medically managed for hypertension and hyperlipidemia.  She has a history of congestive heart failure and coronary artery disease.  She is not on anticoagulation.  She is a smoker.   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   Hyperlipidemia    Hypertension    Hypertensive retinopathy    OU   Osteopenia    Panic attacks    Pneumonia    Reflux esophagitis    Seizure (HCC)    hx of   Tobacco abuse    Vitamin D  deficiency      FAMILY HISTORY:   Family History  Problem Relation Age of Onset   Heart disease Mother    Other Mother        Covid   Glaucoma Mother    Stroke Father    Other Sister        AIDS    SOCIAL HISTORY:   Social History   Tobacco Use   Smoking status: Some Days    Current packs/day: 0.12    Average packs/day: 0.1  packs/day for 30.0 years (3.6 ttl pk-yrs)    Types: Cigarettes   Smokeless tobacco: Never   Tobacco comments:    1 pack lasts almost 2 weeks per pt  Substance Use Topics   Alcohol use: Not Currently    Comment: 05/30/2012 "used to drink back in the day; last alcohol 23 yr ago"     ALLERGIES:   Allergies  Allergen Reactions   Aspirin Anaphylaxis   Beta-Lactamase Inhibitors (Beta-Lactam) Anaphylaxis    Penicillin, Amoxicillin   Morphine  And Codeine Anaphylaxis   Ace Inhibitors Other (See Comments) and Cough    ACE possibly associated with cough and switched to ARB.  Would be OK to re-challenge if needed.     CURRENT MEDICATIONS:   No current facility-administered medications for this encounter.    REVIEW OF SYSTEMS:   [X]  denotes positive finding, [ ]  denotes negative finding Cardiac  Comments:  Chest pain or chest pressure:    Shortness of breath upon exertion:    Short of breath when lying flat:    Irregular heart rhythm:        Vascular    Pain in calf,  thigh, or hip brought on by ambulation:    Pain in feet at night that wakes you up from your sleep:     Blood clot in your veins:    Leg swelling:         Pulmonary    Oxygen at home:    Productive cough:     Wheezing:         Neurologic    Sudden weakness in arms or legs:     Sudden numbness in arms or legs:     Sudden onset of difficulty speaking or slurred speech:    Temporary loss of vision in one eye:     Problems with dizziness:         Gastrointestinal    Blood in stool:     Vomited blood:         Genitourinary    Burning when urinating:     Blood in urine:        Psychiatric    Major depression:         Hematologic    Bleeding problems:    Problems with blood clotting too easily:        Skin    Rashes or ulcers:        Constitutional    Fever or chills:      PHYSICAL EXAM:   There were no vitals filed for this visit.  GENERAL: The patient is a well-nourished female, in no acute  distress. The vital signs are documented above. CARDIAC: There is a regular rate and rhythm.  VASCULAR: Existing tunneled catheter in right chest PULMONARY: Non-labored respirations ABDOMEN: Soft and non-tender with normal pitched bowel sounds.  MUSCULOSKELETAL: There are no major deformities or cyanosis. NEUROLOGIC: No focal weakness or paresthesias are detected. SKIN: There are no ulcers or rashes noted. PSYCHIATRIC: The patient has a normal affect.  STUDIES:   None  MEDICAL ISSUES:   ESRD: I discussed exchanging her existing catheter.  I went over the details of the procedure.  She understands this and begrudgingly wants to proceed as her current catheter is not functioning properly    Gareld June, IV, MD, FACS [facility] [phone] Pager [phone]

## 2023-12-03 NOTE — Progress Notes (Signed)
 Per Dr Charlotte Cookey, keep pt x 1 hour

## 2023-12-04 DIAGNOSIS — D688 Other specified coagulation defects: Secondary | ICD-10-CM | POA: Diagnosis not present

## 2023-12-04 DIAGNOSIS — I129 Hypertensive chronic kidney disease with stage 1 through stage 4 chronic kidney disease, or unspecified chronic kidney disease: Secondary | ICD-10-CM | POA: Diagnosis not present

## 2023-12-04 DIAGNOSIS — D631 Anemia in chronic kidney disease: Secondary | ICD-10-CM | POA: Diagnosis not present

## 2023-12-04 DIAGNOSIS — T8249XA Other complication of vascular dialysis catheter, initial encounter: Secondary | ICD-10-CM | POA: Diagnosis not present

## 2023-12-04 DIAGNOSIS — Z992 Dependence on renal dialysis: Secondary | ICD-10-CM | POA: Diagnosis not present

## 2023-12-04 DIAGNOSIS — N2581 Secondary hyperparathyroidism of renal origin: Secondary | ICD-10-CM | POA: Diagnosis not present

## 2023-12-04 DIAGNOSIS — N186 End stage renal disease: Secondary | ICD-10-CM | POA: Diagnosis not present

## 2023-12-04 DIAGNOSIS — D689 Coagulation defect, unspecified: Secondary | ICD-10-CM | POA: Diagnosis not present

## 2023-12-04 DIAGNOSIS — R52 Pain, unspecified: Secondary | ICD-10-CM | POA: Diagnosis not present

## 2023-12-06 ENCOUNTER — Encounter (HOSPITAL_COMMUNITY): Payer: Self-pay | Admitting: Surgery

## 2023-12-15 ENCOUNTER — Other Ambulatory Visit: Payer: Self-pay | Admitting: Family Medicine

## 2023-12-15 DIAGNOSIS — K50013 Crohn's disease of small intestine with fistula: Secondary | ICD-10-CM

## 2023-12-16 NOTE — Telephone Encounter (Signed)
 Attempted to call patient on 12/16/2023 twice per Dr. Jorden Nevin request about a medication and to clarify if she has established care with GI.  Was unable to leave VM due to VM box being full.   Christ Courier, CMA

## 2023-12-17 ENCOUNTER — Encounter (HOSPITAL_COMMUNITY): Payer: Self-pay | Admitting: Surgery

## 2024-01-29 ENCOUNTER — Encounter (HOSPITAL_COMMUNITY): Payer: Self-pay

## 2024-01-29 ENCOUNTER — Other Ambulatory Visit: Payer: Self-pay

## 2024-01-29 ENCOUNTER — Emergency Department (HOSPITAL_COMMUNITY)

## 2024-01-29 ENCOUNTER — Emergency Department (HOSPITAL_COMMUNITY)
Admission: EM | Admit: 2024-01-29 | Discharge: 2024-01-29 | Disposition: A | Discharge status: Home / Self Care | Attending: Emergency Medicine | Admitting: Emergency Medicine

## 2024-01-29 DIAGNOSIS — S42291A Other displaced fracture of upper end of right humerus, initial encounter for closed fracture: Secondary | ICD-10-CM

## 2024-01-29 DIAGNOSIS — I5020 Unspecified systolic (congestive) heart failure: Secondary | ICD-10-CM | POA: Insufficient documentation

## 2024-01-29 DIAGNOSIS — I2510 Atherosclerotic heart disease of native coronary artery without angina pectoris: Secondary | ICD-10-CM | POA: Insufficient documentation

## 2024-01-29 DIAGNOSIS — Z79899 Other long term (current) drug therapy: Secondary | ICD-10-CM | POA: Insufficient documentation

## 2024-01-29 DIAGNOSIS — N186 End stage renal disease: Secondary | ICD-10-CM | POA: Insufficient documentation

## 2024-01-29 DIAGNOSIS — J45909 Unspecified asthma, uncomplicated: Secondary | ICD-10-CM | POA: Insufficient documentation

## 2024-01-29 DIAGNOSIS — I132 Hypertensive heart and chronic kidney disease with heart failure and with stage 5 chronic kidney disease, or end stage renal disease: Secondary | ICD-10-CM | POA: Diagnosis not present

## 2024-01-29 DIAGNOSIS — Z992 Dependence on renal dialysis: Secondary | ICD-10-CM | POA: Diagnosis not present

## 2024-01-29 DIAGNOSIS — F17210 Nicotine dependence, cigarettes, uncomplicated: Secondary | ICD-10-CM | POA: Insufficient documentation

## 2024-01-29 DIAGNOSIS — W19XXXA Unspecified fall, initial encounter: Secondary | ICD-10-CM | POA: Diagnosis not present

## 2024-01-29 DIAGNOSIS — S4991XA Unspecified injury of right shoulder and upper arm, initial encounter: Secondary | ICD-10-CM | POA: Diagnosis present

## 2024-01-29 DIAGNOSIS — S42321A Displaced transverse fracture of shaft of humerus, right arm, initial encounter for closed fracture: Secondary | ICD-10-CM | POA: Diagnosis not present

## 2024-01-29 MED ORDER — OXYCODONE-ACETAMINOPHEN 5-325 MG PO TABS
1.0000 | ORAL_TABLET | Freq: Four times a day (QID) | ORAL | 0 refills | Status: AC | PRN
Start: 2024-01-29 — End: ?

## 2024-01-29 MED ORDER — OXYCODONE-ACETAMINOPHEN 5-325 MG PO TABS
1.0000 | ORAL_TABLET | Freq: Once | ORAL | Status: AC
  Administered 2024-01-29: 1 via ORAL
  Filled 2024-01-29: qty 1

## 2024-01-29 MED ORDER — OXYCODONE HCL 5 MG PO TABS
5.0000 mg | ORAL_TABLET | Freq: Once | ORAL | Status: AC
  Administered 2024-01-29: 5 mg via ORAL
  Filled 2024-01-29: qty 1

## 2024-01-29 NOTE — ED Provider Notes (Signed)
 [place] EMERGENCY DEPARTMENT AT [facility] Provider Note  CSN: [id number] Arrival date & time: 01/29/24 9040  Chief Complaint(s) Arm Injury and Fall  HPI [name] [name] is a 73 y.o. female with history of ESRD, was on her way to dialysis and assist apple getting to her transport fleeta.  She did go to dialysis and completed her session.  She is left-hand-dominant.  Is endorsing pain in her right elbow, right shoulder and right upper arm.  Patient did not strike her head, is not on blood thinners.   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   Hyperlipidemia    Hypertension    Hypertensive retinopathy    OU   Osteopenia    Panic attacks    Pneumonia    Reflux esophagitis    Seizure (HCC)    hx of   Tobacco abuse    Vitamin D  deficiency    Patient Active Problem List   Diagnosis Date Noted   ESRD (end stage renal disease) (HCC) 09/24/2023   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/2023   Crohn disease (HCC) 10/13/2022   Dense breasts 10/28/2021   Acquired deformity of toenail 05/19/2021   ESRD (end stage renal disease) on dialysis (HCC) 04/25/2021   HFrEF (heart failure with reduced ejection fraction) (HCC) 03/27/2020   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   Right knee pain 11/02/2018   Eczema, dyshidrotic 05/04/2014   Vitamin D  deficiency 05/20/2013   Hypotension 02/08/2013   CAD (coronary artery disease) 05/30/2012   Tobacco abuse 05/30/2012   Anemia due  to chronic kidney disease 05/30/2012   Osteopenia 10/05/2011   Atypical squamous cells of undetermined significance on cytologic smear of cervix (ASC-US ) 09/21/2011   Left renal artery stenosis (HCC) 09/04/2011   Lumbar back pain with radiculopathy affecting right lower extremity 08/27/2011   History of seizure 03/13/2011   HYPERCHOLESTEROLEMIA 01/10/2007   GAD (generalized anxiety disorder) 09/02/2006   NEUROPATHY, PERIPHERAL 09/02/2006   Reflux esophagitis 09/02/2006   Home Medication(s) Prior to Admission medications   Medication Sig Start Date End Date Taking? Authorizing Provider  oxyCODONE -acetaminophen  (PERCOCET/ROXICET) 5-325 MG tablet Take 1 tablet by mouth every 6 (six) hours as needed for severe pain (pain score 7-10). 01/29/24  Yes Mannie Pac T, DO  acetaminophen  (TYLENOL ) 500 MG tablet Take 1,000 mg by mouth every 6 (six) hours as needed for mild pain (pain score 1-3) or moderate pain (pain score 4-6).    [provider]  Ascorbic Acid  500 MG CAPS Take 500 mg by mouth daily. 04/30/21   [provider]  atorvastatin  (LIPITOR) 40 MG tablet Take 1 tablet (40 mg total) by mouth daily. 05/18/23   Delores Suzann HERO, MD  citalopram  (CELEXA ) 10 MG tablet Take 1 tablet (10 mg total) by mouth daily. 06/07/23   Madelon Donald HERO, DO  diclofenac  Sodium (VOLTAREN ) 1 % GEL APPLY TWO GRAMS  TOPICAALY FOUR TIMES DAILY Patient taking differently: Apply 2 g topically daily as needed (sciatica). 06/22/23   Rumball, Alison M, DO  Glycerin (CLEAR EYES ADV DRY & ITCHY RLF) 0.25 % SOLN Place 1 drop into both eyes daily as needed (Dry eye).    [provider]  lidocaine -prilocaine  (EMLA ) cream Apply 1 Application topically Every Tuesday,Thursday,and Saturday with dialysis.    [provider]  LORazepam  (ATIVAN ) 0.5 MG tablet Take 1 tablet (0.5 mg total) by mouth 2 (two) times daily as needed. for anxiety Patient taking differently: Take 0.5 mg by mouth 2 (two) times  daily as needed for anxiety. 07/19/23   Rumball, Alison M, DO  Mesalamine  800 MG TBEC Take 1 tablet (800 mg total) by mouth 3 (three) times daily. 01/20/23   Rumball, Alison M, DO  midodrine  (PROAMATINE ) 10 MG tablet Take 10 mg by mouth Every Tuesday,Thursday,and Saturday with dialysis.    [provider]  mirtazapine  (REMERON ) 15 MG tablet TAKE ONE TABLET BY MOUTH AT BEDTIME 07/19/23   Rumball, Alison M, DO  pregabalin  (LYRICA ) 25 MG capsule Take 1 capsule (25 mg total) by mouth 2 (two) times daily. 10/19/23   Rumball, Alison M, DO  sevelamer  carbonate (RENVELA ) 800 MG tablet Take 1,600 mg by mouth 3 (three) times daily with meals.    [provider]                                                                                                                                    Past Surgical History Past Surgical History:  Procedure Laterality Date   A/V FISTULAGRAM Left 08/23/2023   Procedure: A/V Fistulagram;  Surgeon: Pearline Norman RAMAN, MD;  Location: [facility] INVASIVE CV LAB;  Service: Cardiovascular;  Laterality: Left;   AV FISTULA PLACEMENT Right 04/28/2021   Procedure: RIGHT ARTERIOVENOUS (AV) GRAFT INSERTION USING 4-7MM x 45CM GORE-TEX GRAFT;  Surgeon: Eliza Lonni RAMAN, MD;  Location: [facility] OR;  Service: Vascular;  Laterality: Right;   AV FISTULA PLACEMENT Left 07/28/2023   Procedure: LEFT ARM ARTERIOVENOUS (AV) GRAFT USING CORE-TEX STRETCH VASCULAR;  Surgeon: Pearline Norman RAMAN, MD;  Location: [facility] OR;  Service: Vascular;  Laterality: Left;   CATARACT EXTRACTION Bilateral    Dr. Lamar Gaudy   CATARACT EXTRACTION W/ INTRAOCULAR LENS  IMPLANT, BILATERAL  ~ 2000   CHOLECYSTECTOMY  01/28/2005   COLOSTOMY  03/1996   diverting   ECTOPIC PREGNANCY SURGERY  ?1980's   left   EYE SURGERY Bilateral    Cat Sx - Dr. Lamar Gaudy   ILEOSTOMY  ?  2002   IR FLUORO GUIDE CV LINE RIGHT  04/25/2021   IR US  GUIDE VASC ACCESS RIGHT  04/25/2021   NECK SURGERY  2020   pinched nerve    PERIPHERAL VASCULAR BALLOON ANGIOPLASTY Left 08/23/2023   Procedure: PERIPHERAL VASCULAR BALLOON ANGIOPLASTY;  Surgeon: Pearline Norman RAMAN, MD;  Location: [facility] INVASIVE CV LAB;  Service: Cardiovascular;  Laterality: Left;   PERIPHERAL VASCULAR INTERVENTION Left 08/23/2023   Procedure: PERIPHERAL VASCULAR INTERVENTION;  Surgeon: Pearline Norman RAMAN, MD;  Location: [facility] INVASIVE CV LAB;  Service: Cardiovascular;  Laterality: Left;   PERIPHERAL VASCULAR THROMBECTOMY Left 08/23/2023   Procedure: PERIPHERAL VASCULAR THROMBECTOMY;  Surgeon: Pearline Norman RAMAN, MD;  Location: [facility] INVASIVE CV LAB;  Service: Cardiovascular;  Laterality: Left;   REMOVAL OF GRAFT Left 09/24/2023   Procedure: REMOVAL, GRAFT, BRACHIAL ARTERY PATCH;  Surgeon: Sheree Penne Bruckner, MD;  Location: [facility] OR;  Service: Vascular;  Laterality: Left;  Left arm arteriovenous graft excision   TUNNELLED CATHETER EXCHANGE N/A 12/03/2023   Procedure: TUNNELLED CATHETER EXCHANGE;  Surgeon: Serene Gaile ORN, MD;  Location: [facility] LAB;  Service: Cardiovascular;  Laterality: N/A;   Family History Family History  Problem Relation Age of Onset   Heart disease Mother    Other Mother        Covid   Glaucoma Mother    Stroke Father    Other Sister        AIDS    Social History Social History   Tobacco Use   Smoking status: Some Days    Current packs/day: 0.12    Average packs/day: 0.1 packs/day for 30.0 years (3.6 ttl pk-yrs)    Types: Cigarettes   Smokeless tobacco: Never   Tobacco comments:    1 pack lasts almost 2 weeks per pt  Vaping Use   Vaping status: Never Used  Substance Use Topics   Alcohol use: Not Currently    Comment: 05/30/2012 used to drink back in the day; last alcohol 23 yr ago   Drug use: No   Allergies Aspirin, Beta-lactamase inhibitors (beta-lactam), Morphine  and codeine, and Ace inhibitors  Review of Systems Review of Systems  Physical Exam Vital Signs  I have reviewed the triage vital signs BP (!) 93/56 (BP  Location: Left Arm)   Pulse 89   Temp 98.3 F (36.8 C) (Oral)   Resp 18   Ht 5' 6 (1.676 m)   Wt 59 kg   SpO2 98%   BMI 20.98 kg/m   Physical Exam Vitals and nursing note reviewed.  HENT:     Head: Normocephalic and atraumatic.  Cardiovascular:     Rate and Rhythm: Normal rate.  Pulmonary:     Effort: Pulmonary effort is normal.     Breath sounds: Normal breath sounds.  Abdominal:     General: Abdomen is flat. There is no distension.     Palpations: Abdomen is soft.     Tenderness: There is no abdominal tenderness.  Musculoskeletal:     Cervical back: Normal range of motion and neck supple. No rigidity or tenderness.     Comments: Right arm-patient with tenderness in the right shoulder, right humerus and right elbow.  There is no obvious deformity.  Patient in sling.  No tenderness in the forearm, wrist or hand.  Neurological:     Mental Status: She is alert.     Cranial Nerves: No cranial nerve deficit.     Motor: No weakness.     Gait: Gait normal.     ED Results and Treatments Labs (all labs ordered are listed, but only abnormal results are displayed) Labs Reviewed - No data to display  Radiology DG Shoulder Right Result Date: 01/29/2024 CLINICAL DATA:  Right shoulder pain following a fall. EXAM: RIGHT SHOULDER - 2+ VIEW COMPARISON:  None Available. FINDINGS: Transverse fracture of the right humeral neck with mild anterior displacement of the distal fragment without significant angulation. A right upper arm vascular graft is noted. IMPRESSION: Mildly displaced transverse fracture of the right humeral neck. Electronically Signed   By: Elspeth Bathe [name]   On: 01/29/2024 11:22    Pertinent labs & imaging results that were available during my care of the patient were reviewed by me and considered in my medical decision making (see MDM for  details).  Medications Ordered in ED Medications  oxyCODONE -acetaminophen  (PERCOCET/ROXICET) 5-325 MG per tablet 1 tablet (has no administration in time range)  oxyCODONE  (Oxy IR/ROXICODONE ) immediate release tablet 5 mg (5 mg Oral Given 01/29/24 1023)                                                                                                                                     Procedures Procedures  (including critical care time)  Medical Decision Making / ED Course   This patient presents to the ED for concern of fall, this involves an extensive number of treatment options, and is a complaint that carries with it a high risk of complications and morbidity.  The differential diagnosis includes humerus fracture, elbow fracture, shoulder fracture.  MDM: Patient with with dialysis.  Doubly she requires labs here today.  Plain films of the patient's right upper extremity ordered.  Analgesia provided.  Reassessment 11:30 AM-patient with a proximal humerus fracture.  Mild displacement.  Sling ordered.  Additional analgesia ordered.  Patient lives at home with daughter who will be able to assist her with her ADLs.  Will discharge with orthopedic follow-up.   Additional history obtained:  -External records from outside source obtained and reviewed including: Chart review including previous notes, labs, imaging, consultation notes   Lab Tests: -I ordered, reviewed, and interpreted labs.   The pertinent results include:   Labs Reviewed - No data to display    Imaging Studies ordered: I ordered imaging studies including plain films of the shoulder I independently visualized and interpreted imaging. I agree with the radiologist interpretation   Medicines ordered and prescription drug management: Meds ordered this encounter  Medications   oxyCODONE  (Oxy IR/ROXICODONE ) immediate release tablet 5 mg    Refill:  0   oxyCODONE -acetaminophen  (PERCOCET/ROXICET) 5-325 MG per tablet 1  tablet    Refill:  0   oxyCODONE -acetaminophen  (PERCOCET/ROXICET) 5-325 MG tablet    Sig: Take 1 tablet by mouth every 6 (six) hours as needed for severe pain (pain score 7-10).    Dispense:  15 tablet    Refill:  0    -I have reviewed the patients home medicines and have made adjustments as needed    Cardiac Monitoring: The patient was maintained on a cardiac monitor.  I personally viewed and interpreted the cardiac monitored which showed an underlying rhythm of: Normal sinus rhythm  Social Determinants of Health:  Factors impacting patients care include: Multiple medical comorbidities including end-stage renal disease on dialysis   Reevaluation: After the interventions noted above, I reevaluated the patient and found that they have :improved  Co morbidities that complicate the patient evaluation  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     Final Clinical Impression(s) / ED Diagnoses Final diagnoses:  Humerus head fracture, right, closed, initial encounter     @PCDICTATION @    Mannie Pac T, DO 01/29/24 1140

## 2024-01-29 NOTE — Progress Notes (Signed)
 Orthopedic Tech Progress Note Patient Details:  [name] [name] [birth year] [id number]  Ortho Devices Type of Ortho Device: Shoulder immobilizer Ortho Device/Splint Location: RUE Ortho Device/Splint Interventions: Application   Post Interventions Patient Tolerated: Well  Massie BRAVO [name] [name] 01/29/2024, 12:31 PM

## 2024-01-29 NOTE — ED Triage Notes (Signed)
 Pt coming in from dialysis via [facility]. Pt fell this morning when getting into transport van on her way to dialysis. Pt lives at home. Pt fell on right side on her arm. Pt reports hearing a pop. Pt denies thinners. Pt denies loc. Pt denies hitting head.    Pt finished dialysis. Provider at dialysis gave Tylenol  when she got to dialysis.    Ems vials  Bp 108/58 Pulse 90 Rr 20  97% on ra   Cbg 101

## 2024-01-29 NOTE — Discharge Instructions (Addendum)
 You have a fracture of your proximal humerus.  This is the bone in your upper arm.  Keep your sling on to help prevent pain.  I have sent you a prescription for Percocet.  This is a medication that contains oxycodone  and Tylenol .  You may take this every 6 hours as needed for pain.  Included in your discharge paperwork is the telephone number for an orthopedic doctor.  Please call them on Monday, tell them that you have a proximal wrist fracture.  They should be able to see you within the next couple of days.

## 2024-02-22 ENCOUNTER — Telehealth: Payer: Self-pay | Admitting: Family Medicine

## 2024-02-22 DIAGNOSIS — F419 Anxiety disorder, unspecified: Secondary | ICD-10-CM

## 2024-02-22 MED ORDER — LORAZEPAM 0.5 MG PO TABS: 0.5000 mg | ORAL_TABLET | Freq: Every day | ORAL | 0 refills | Status: DC | PRN

## 2024-02-22 NOTE — Addendum Note (Signed)
 Addended by: MADELON DONALD HERO on: 02/22/2024 03:30 PM   Modules accepted: Orders

## 2024-02-22 NOTE — Telephone Encounter (Signed)
 Patient request refill of:  Name of Medication(s):  Lorazepam  Last date of OV:  07/19/2023 Pharmacy:  [facility] on [address]  Will route refill request to Team CMA.  Discussed with patient policy to call pharmacy for future refills.  Also, discussed refills may take up to 48 hours to approve or deny.  Harper A Warrick

## 2024-02-28 ENCOUNTER — Other Ambulatory Visit: Payer: Self-pay | Admitting: Family Medicine

## 2024-02-28 DIAGNOSIS — F419 Anxiety disorder, unspecified: Secondary | ICD-10-CM

## 2024-02-28 DIAGNOSIS — K50013 Crohn's disease of small intestine with fistula: Secondary | ICD-10-CM

## 2024-02-29 ENCOUNTER — Telehealth: Payer: Self-pay

## 2024-02-29 NOTE — Telephone Encounter (Signed)
 Attempted to call patient per Dr. Roark request.  LVM for patient to call office back.  If patient calls back, please clarify if patient have established wit GI. And if so, they should have taken over the mesalamine  prescription. Does patient need a refill? And to let Dr. Madelon know if patient does.  Harlene Reiter, CMA

## 2024-04-03 ENCOUNTER — Encounter: Payer: Self-pay | Admitting: Vascular Surgery

## 2024-04-03 ENCOUNTER — Other Ambulatory Visit: Payer: Self-pay | Admitting: Family Medicine

## 2024-04-03 DIAGNOSIS — K50013 Crohn's disease of small intestine with fistula: Secondary | ICD-10-CM

## 2024-04-18 ENCOUNTER — Other Ambulatory Visit: Payer: Self-pay

## 2024-04-18 DIAGNOSIS — N186 End stage renal disease: Secondary | ICD-10-CM

## 2024-05-12 ENCOUNTER — Ambulatory Visit (HOSPITAL_COMMUNITY): Admission: RE | Admit: 2024-05-12 | Source: Ambulatory Visit

## 2024-05-12 ENCOUNTER — Ambulatory Visit: Attending: Vascular Surgery | Admitting: Vascular Surgery

## 2024-05-12 NOTE — Progress Notes (Deleted)
 Patient ID: [name] [name], female   DOB: September 13, 1950, 73 y.o.   MRN: [id number]  Reason for Consult: No chief complaint on file.   Referred by Marlee Bernardino NOVAK, MD  Subjective:     HPI [name] [name] is a 73 y.o. female who presents for evaluation HD access creation.  Past Medical History: No date: Abnormal chest CT     Comment:  Coronary atherosclerosis on chest CT 2012 No date: Anemia, chronic renal failure No date: Anxiety No date: Arthritis 05/2011: Asthma No date: CAD (coronary artery disease) No date: Carpal tunnel syndrome No date: CHF (congestive heart failure) (HCC)     Comment:  EF 30-35% 2012->EF 60-65% 2013 No date: CKD (chronic kidney disease), stage III (HCC) No date: Crohn's disease (HCC) No date: GERD (gastroesophageal reflux disease) No date: Headache(784.0)     Comment:  related to high BP (05/30/2012) 1990s: History of viral myocarditis No date: Hyperlipidemia No date: Hypertension No date: Hypertensive retinopathy     Comment:  OU No date: Osteopenia No date: Panic attacks No date: Pneumonia No date: Reflux esophagitis No date: Seizure (HCC)     Comment:  hx of No date: Tobacco abuse No date: Vitamin D  deficiency  Family History  Problem Relation Age of Onset   Heart disease Mother    Other Mother        Covid   Glaucoma Mother    Stroke Father    Other Sister        AIDS   Past Surgical History:  Procedure Laterality Date   A/V FISTULAGRAM Left 08/23/2023   Procedure: A/V Fistulagram;  Surgeon: Pearline Norman RAMAN, MD;  Location: [facility] INVASIVE CV LAB;  Service: Cardiovascular;  Laterality: Left;   AV FISTULA PLACEMENT Right 04/28/2021   Procedure: RIGHT ARTERIOVENOUS (AV) GRAFT INSERTION USING 4-7MM x 45CM GORE-TEX GRAFT;  Surgeon: Eliza Lonni RAMAN, MD;  Location: [facility] OR;  Service: Vascular;  Laterality: Right;   AV FISTULA PLACEMENT Left 07/28/2023   Procedure: LEFT ARM ARTERIOVENOUS (AV) GRAFT USING CORE-TEX STRETCH VASCULAR;  Surgeon:  Pearline Norman RAMAN, MD;  Location: [facility] OR;  Service: Vascular;  Laterality: Left;   CATARACT EXTRACTION Bilateral    Dr. Lamar Gaudy   CATARACT EXTRACTION W/ INTRAOCULAR LENS  IMPLANT, BILATERAL  ~ 2000   CHOLECYSTECTOMY  01/28/2005   COLOSTOMY  03/1996   diverting   ECTOPIC PREGNANCY SURGERY  ?1980's   left   EYE SURGERY Bilateral    Cat Sx - Dr. Lamar Gaudy   ILEOSTOMY  ?  2002   IR FLUORO GUIDE CV LINE RIGHT  04/25/2021   IR US  GUIDE VASC ACCESS RIGHT  04/25/2021   NECK SURGERY  2020   pinched nerve   PERIPHERAL VASCULAR BALLOON ANGIOPLASTY Left 08/23/2023   Procedure: PERIPHERAL VASCULAR BALLOON ANGIOPLASTY;  Surgeon: Pearline Norman RAMAN, MD;  Location: [facility] INVASIVE CV LAB;  Service: Cardiovascular;  Laterality: Left;   PERIPHERAL VASCULAR INTERVENTION Left 08/23/2023   Procedure: PERIPHERAL VASCULAR INTERVENTION;  Surgeon: Pearline Norman RAMAN, MD;  Location[facility] INVASIVE CV LAB;  Service: Cardiovascular;  Laterality: Left;   PERIPHERAL VASCULAR THROMBECTOMY Left 08/23/2023   Procedure: PERIPHERAL VASCULAR THROMBECTOMY;  Surgeon: Pearline Norman RAMAN, MD;  Location: [facility] INVASIVE CV LAB;  Service: Cardiovascular;  Laterality: Left;   REMOVAL OF GRAFT Left 09/24/2023   Procedure: REMOVAL, GRAFT, BRACHIAL ARTERY PATCH;  Surgeon: Sheree Penne Lonni, MD;  Location: [facility] OR;  Service: Vascular;  Laterality: Left;  Left arm arteriovenous graft excision  TUNNELLED CATHETER EXCHANGE N/A 12/03/2023   Procedure: TUNNELLED CATHETER EXCHANGE;  Surgeon: Serene Gaile ORN, MD;  Location: [facility];  Service: Cardiovascular;  Laterality: N/A;    Short Social History:  Social History   Tobacco Use   Smoking status: Some Days    Current packs/day: 0.12    Average packs/day: 0.1 packs/day for 30.0 years (3.6 ttl pk-yrs)    Types: Cigarettes   Smokeless tobacco: Never   Tobacco comments:    1 pack lasts almost 2 weeks per pt  Substance Use Topics   Alcohol use: Not Currently    Comment: 05/30/2012 used  to drink back in the day; last alcohol 23 yr ago    Allergies  Allergen Reactions   Aspirin Anaphylaxis   Beta-Lactamase Inhibitors (Beta-Lactam) Anaphylaxis    Penicillin, Amoxicillin   Morphine  And Codeine Anaphylaxis   Ace Inhibitors Other (See Comments) and Cough    ACE possibly associated with cough and switched to ARB.  Would be OK to re-challenge if needed.    Current Outpatient Medications  Medication Sig Dispense Refill   acetaminophen  (TYLENOL ) 500 MG tablet Take 1,000 mg by mouth every 6 (six) hours as needed for mild pain (pain score 1-3) or moderate pain (pain score 4-6).     Ascorbic Acid  500 MG CAPS Take 500 mg by mouth daily.     atorvastatin  (LIPITOR) 40 MG tablet Take 1 tablet (40 mg total) by mouth daily. 90 tablet 3   citalopram  (CELEXA ) 10 MG tablet Take 1 tablet (10 mg total) by mouth daily. 90 tablet 3   diclofenac  Sodium (VOLTAREN ) 1 % GEL APPLY TWO GRAMS TOPICAALY FOUR TIMES DAILY (Patient taking differently: Apply 2 g topically daily as needed (sciatica).) 100 g 0   Glycerin (CLEAR EYES ADV DRY & ITCHY RLF) 0.25 % SOLN Place 1 drop into both eyes daily as needed (Dry eye).     lidocaine -prilocaine  (EMLA ) cream Apply 1 Application topically Every Tuesday,Thursday,and Saturday with dialysis.     LORazepam  (ATIVAN ) 0.5 MG tablet Take 1 tablet (0.5 mg total) by mouth daily as needed. for anxiety 60 tablet 0   Mesalamine  800 MG TBEC TAKE ONE TABLET BY MOUTH THREE TIMES DAILY 270 tablet 0   midodrine  (PROAMATINE ) 10 MG tablet Take 10 mg by mouth Every Tuesday,Thursday,and Saturday with dialysis.     mirtazapine  (REMERON ) 15 MG tablet TAKE ONE TABLET BY MOUTH AT BEDTIME 90 tablet 3   oxyCODONE -acetaminophen  (PERCOCET/ROXICET) 5-325 MG tablet Take 1 tablet by mouth every 6 (six) hours as needed for severe pain (pain score 7-10). 15 tablet 0   pregabalin  (LYRICA ) 25 MG capsule Take 1 capsule (25 mg total) by mouth 2 (two) times daily. 180 capsule 1   sevelamer  carbonate  (RENVELA ) 800 MG tablet Take 1,600 mg by mouth 3 (three) times daily with meals.     No current facility-administered medications for this visit.    REVIEW OF SYSTEMS All other systems were reviewed and are negative    Objective:  Objective   There were no vitals filed for this visit. There is no height or weight on file to calculate BMI.  Physical Exam General: no acute distress Cardiac: hemodynamically stable Extremities: Bilateral upper extremities with failed AV grafts Vascular:   Right: palpable brachial, radial  Left: palpable brachial, radial   Data: ABI ***      Assessment/Plan:     Brazil Voytko is a 73 y.o. female with ESRD who presents to discuss permanent access  creation.  Has now had failed AV grafts in bilateral arms.  Is currently dialyzing via a right IJ TDC.  She is *** a candidate for thigh graft ***.     Norman GORMAN Serve MD [facility]

## 2024-06-06 ENCOUNTER — Other Ambulatory Visit: Payer: Self-pay | Admitting: Family Medicine

## 2024-06-06 DIAGNOSIS — F419 Anxiety disorder, unspecified: Secondary | ICD-10-CM

## 2024-07-18 ENCOUNTER — Other Ambulatory Visit: Payer: Self-pay

## 2024-07-18 DIAGNOSIS — F419 Anxiety disorder, unspecified: Secondary | ICD-10-CM

## 2024-07-18 MED ORDER — LORAZEPAM 0.5 MG PO TABS: 0.5000 mg | ORAL_TABLET | Freq: Every day | ORAL | 0 refills | Status: AC | PRN

## 2024-11-23 ENCOUNTER — Encounter
